# Patient Record
Sex: Female | Born: 1950 | Race: Black or African American | Hispanic: No | Smoking: Never smoker
Health system: Southern US, Community
[De-identification: ages and names within clinical notes are randomized; demographics above are authoritative.]

## PROBLEM LIST (undated history)

## (undated) DIAGNOSIS — N189 Chronic kidney disease, unspecified: Secondary | ICD-10-CM

## (undated) DIAGNOSIS — I10 Essential (primary) hypertension: Secondary | ICD-10-CM

## (undated) DIAGNOSIS — E7800 Pure hypercholesterolemia, unspecified: Secondary | ICD-10-CM

## (undated) DIAGNOSIS — I442 Atrioventricular block, complete: Secondary | ICD-10-CM

## (undated) DIAGNOSIS — J9611 Chronic respiratory failure with hypoxia: Secondary | ICD-10-CM

## (undated) DIAGNOSIS — J449 Chronic obstructive pulmonary disease, unspecified: Secondary | ICD-10-CM

## (undated) DIAGNOSIS — E119 Type 2 diabetes mellitus without complications: Secondary | ICD-10-CM

## (undated) DIAGNOSIS — M1990 Unspecified osteoarthritis, unspecified site: Secondary | ICD-10-CM

## (undated) DIAGNOSIS — J45909 Unspecified asthma, uncomplicated: Secondary | ICD-10-CM

## (undated) DIAGNOSIS — I509 Heart failure, unspecified: Secondary | ICD-10-CM

## (undated) HISTORY — DX: Atrioventricular block, complete: I44.2

## (undated) HISTORY — DX: Chronic kidney disease, unspecified: N18.9

## (undated) HISTORY — DX: Chronic obstructive pulmonary disease, unspecified: J44.9

## (undated) HISTORY — DX: Hypomagnesemia: E83.42

## (undated) HISTORY — DX: Heart failure, unspecified: I50.9

## (undated) HISTORY — DX: Chronic respiratory failure with hypoxia: J96.11

## (undated) HISTORY — PX: ABDOMINAL HYSTERECTOMY: SHX81

---

## 1998-01-02 HISTORY — PX: OTHER SURGICAL HISTORY: SHX169

## 2007-10-06 ENCOUNTER — Emergency Department (HOSPITAL_BASED_OUTPATIENT_CLINIC_OR_DEPARTMENT_OTHER): Admission: EM | Admit: 2007-10-06 | Discharge: 2007-10-06 | Payer: Self-pay | Admitting: Emergency Medicine

## 2008-06-13 ENCOUNTER — Emergency Department (HOSPITAL_BASED_OUTPATIENT_CLINIC_OR_DEPARTMENT_OTHER): Admission: EM | Admit: 2008-06-13 | Discharge: 2008-06-13 | Payer: Self-pay | Admitting: Emergency Medicine

## 2008-09-02 ENCOUNTER — Ambulatory Visit (HOSPITAL_BASED_OUTPATIENT_CLINIC_OR_DEPARTMENT_OTHER): Admission: RE | Admit: 2008-09-02 | Discharge: 2008-09-02 | Payer: Self-pay | Admitting: Internal Medicine

## 2008-09-02 ENCOUNTER — Ambulatory Visit: Payer: Self-pay | Admitting: Diagnostic Radiology

## 2010-10-04 LAB — GLUCOSE, CAPILLARY: Glucose-Capillary: 155 — ABNORMAL HIGH

## 2013-07-05 ENCOUNTER — Encounter (HOSPITAL_BASED_OUTPATIENT_CLINIC_OR_DEPARTMENT_OTHER): Payer: Self-pay | Admitting: Emergency Medicine

## 2013-07-05 ENCOUNTER — Emergency Department (HOSPITAL_BASED_OUTPATIENT_CLINIC_OR_DEPARTMENT_OTHER)
Admission: EM | Admit: 2013-07-05 | Discharge: 2013-07-05 | Disposition: A | Discharge status: Home / Self Care | Payer: Medicare HMO | Attending: Emergency Medicine | Admitting: Emergency Medicine

## 2013-07-05 DIAGNOSIS — I10 Essential (primary) hypertension: Secondary | ICD-10-CM | POA: Insufficient documentation

## 2013-07-05 DIAGNOSIS — Z7982 Long term (current) use of aspirin: Secondary | ICD-10-CM | POA: Insufficient documentation

## 2013-07-05 DIAGNOSIS — M5380 Other specified dorsopathies, site unspecified: Secondary | ICD-10-CM | POA: Insufficient documentation

## 2013-07-05 DIAGNOSIS — M62830 Muscle spasm of back: Secondary | ICD-10-CM

## 2013-07-05 DIAGNOSIS — E119 Type 2 diabetes mellitus without complications: Secondary | ICD-10-CM | POA: Insufficient documentation

## 2013-07-05 DIAGNOSIS — Z79899 Other long term (current) drug therapy: Secondary | ICD-10-CM | POA: Insufficient documentation

## 2013-07-05 DIAGNOSIS — Z794 Long term (current) use of insulin: Secondary | ICD-10-CM | POA: Insufficient documentation

## 2013-07-05 DIAGNOSIS — M129 Arthropathy, unspecified: Secondary | ICD-10-CM | POA: Insufficient documentation

## 2013-07-05 HISTORY — DX: Pure hypercholesterolemia, unspecified: E78.00

## 2013-07-05 HISTORY — DX: Type 2 diabetes mellitus without complications: E11.9

## 2013-07-05 HISTORY — DX: Unspecified osteoarthritis, unspecified site: M19.90

## 2013-07-05 HISTORY — DX: Essential (primary) hypertension: I10

## 2013-07-05 MED ORDER — CYCLOBENZAPRINE HCL 5 MG PO TABS: 5.0000 mg | ORAL_TABLET | Freq: Three times a day (TID) | ORAL | Status: DC | PRN

## 2013-07-05 MED ORDER — IBUPROFEN 600 MG PO TABS: 600.0000 mg | ORAL_TABLET | Freq: Four times a day (QID) | ORAL | Status: DC | PRN

## 2013-07-05 NOTE — Discharge Instructions (Signed)

## 2013-07-05 NOTE — ED Provider Notes (Signed)
CSN: [id number]     Arrival date & time 07/05/13  1237 History   First MD Initiated Contact with Patient 07/05/13 1343     Chief Complaint  Patient presents with  . Back Pain     (Consider location/radiation/quality/duration/timing/severity/associated sxs/prior Treatment) HPI Morgan Fischer is a 63 y.o. female that presents to the ED for left back pain. Her pain started 5 days ago. Pain is non-radiating and worse with movement and when laying on her right side. She has been taking ibuprofen 600mg  twice daily which helps. She has no recent injury. No recent heavy lifting or exercising. No fevers, no abdominal pain, no urinary symptoms.  Past Medical History  Diagnosis Date  . Diabetes mellitus without complication   . Hypertension   . High cholesterol   . Arthritis    History reviewed. No pertinent past surgical history. No family history on file. History  Substance Use Topics  . Smoking status: Never Smoker   . Smokeless tobacco: Not on file  . Alcohol Use: Not on file   OB History   Grav Para Term Preterm Abortions TAB SAB Ect Mult Living                 Review of Systems  Constitutional: Negative for fever.  Gastrointestinal: Negative for nausea and vomiting.  Genitourinary: Negative for dysuria, frequency and flank pain.  Musculoskeletal: Positive for back pain. Negative for gait problem.  All other systems reviewed and are negative.     Allergies  Ciprocinonide; Catapres; and Demadex  Home Medications   Prior to Admission medications   Medication Sig Start Date End Date Taking? Authorizing Provider  allopurinol (ZYLOPRIM) 300 MG tablet Take 300 mg by mouth daily.   Yes Historical Provider, MD  amLODipine (NORVASC) 10 MG tablet Take 10 mg by mouth daily.   Yes Historical Provider, MD  aspirin 325 MG EC tablet Take 325 mg by mouth daily.   Yes Historical Provider, MD  atenolol-chlorthalidone (TENORETIC) 100-25 MG per tablet Take 1 tablet by mouth daily.   Yes  Historical Provider, MD  cholecalciferol (VITAMIN D) 1000 UNITS tablet Take 1,000 Units by mouth daily.   Yes Historical Provider, MD  furosemide (LASIX) 20 MG tablet Take 230 mg by mouth 2 (two) times daily.   Yes Historical Provider, MD  glimepiride (AMARYL) 4 MG tablet Take 4 mg by mouth 2 (two) times daily.   Yes Historical Provider, MD  insulin NPH-regular Human (NOVOLIN 70/30) (70-30) 100 UNIT/ML injection Inject 50 Units into the skin 2 (two) times daily with a meal.   Yes Historical Provider, MD  losartan (COZAAR) 50 MG tablet Take 50 mg by mouth daily.   Yes Historical Provider, MD  montelukast (SINGULAIR) 10 MG tablet Take 10 mg by mouth at bedtime.   Yes Historical Provider, MD  nateglinide (STARLIX) 60 MG tablet Take 60 mg by mouth 3 (three) times daily with meals.   Yes Historical Provider, MD  potassium chloride SA (K-DUR,KLOR-CON) 20 MEQ tablet Take 20 mEq by mouth 2 (two) times daily.   Yes Historical Provider, MD  sitaGLIPtin-metformin (JANUMET) 50-1000 MG per tablet Take 1 tablet by mouth once.   Yes Historical Provider, MD  cyclobenzaprine (FLEXERIL) 5 MG tablet Take 1 tablet (5 mg total) by mouth 3 (three) times daily as needed for muscle spasms. 07/05/13   Jacquelin Hawkingalph Nettey, MD  ibuprofen (ADVIL,MOTRIN) 600 MG tablet Take 1 tablet (600 mg total) by mouth every 6 (six) hours as needed. 07/05/13  Jacquelin Hawkingalph Nettey, MD   BP 174/82  Pulse 77  Temp(Src) 98 F (36.7 C)  Resp 18  Wt 350 lb (158.759 kg)  SpO2 100%  Physical Exam  Vitals reviewed. Constitutional: She is oriented to person, place, and time. She appears well-developed and well-nourished.  Eyes: Conjunctivae and EOM are normal.  Pulmonary/Chest: Effort normal.  Musculoskeletal: Normal range of motion.       Lumbar back: She exhibits tenderness (left sided tenderness). She exhibits normal range of motion and no bony tenderness.  Neurological: She is alert and oriented to person, place, and time.  Skin: Skin is warm and dry.     ED Course  Procedures (including critical care time) Labs Review Labs Reviewed - No data to display  Imaging Review No results found.   EKG Interpretation None      MDM   Final diagnoses:  Muscle spasm of back    Patient's pain consistent with muscle spasm. No decreased range of motion, no vertebral tenderness, no urinary symptoms and pain does not interfere with daily living. Prescribed ibuprofen, muscle relaxant. Discussed treatment plan with patient and her husband. Patient understands and agrees with plan. Will follow-up with PCP as needed.  Jacquelin Hawkingalph Nettey, MD 07/05/13 [phone]

## 2013-07-05 NOTE — ED Notes (Signed)
Patient here with increasing left sided back pain since Monday, pain worse with movement and change in position, denies urinary symptoms

## 2013-07-06 NOTE — ED Provider Notes (Signed)
I saw and evaluated the patient, reviewed the resident's note and I agree with the findings and plan.   .Face to face Exam:  General:  Awake HEENT:  Atraumatic Resp:  Normal effort Abd:  Nondistended Neuro:No focal weakness  Nelia Shiobert L Detra Bores, MD 07/06/13 [phone]

## 2015-01-08 DIAGNOSIS — I2781 Cor pulmonale (chronic): Secondary | ICD-10-CM | POA: Diagnosis not present

## 2015-01-08 DIAGNOSIS — R0602 Shortness of breath: Secondary | ICD-10-CM | POA: Diagnosis not present

## 2015-01-08 DIAGNOSIS — G4730 Sleep apnea, unspecified: Secondary | ICD-10-CM | POA: Diagnosis not present

## 2015-01-08 DIAGNOSIS — J45909 Unspecified asthma, uncomplicated: Secondary | ICD-10-CM | POA: Diagnosis not present

## 2015-01-13 DIAGNOSIS — J449 Chronic obstructive pulmonary disease, unspecified: Secondary | ICD-10-CM | POA: Diagnosis not present

## 2015-01-13 DIAGNOSIS — J45909 Unspecified asthma, uncomplicated: Secondary | ICD-10-CM | POA: Diagnosis not present

## 2015-01-23 DIAGNOSIS — J449 Chronic obstructive pulmonary disease, unspecified: Secondary | ICD-10-CM | POA: Diagnosis not present

## 2015-01-23 DIAGNOSIS — J45909 Unspecified asthma, uncomplicated: Secondary | ICD-10-CM | POA: Diagnosis not present

## 2015-02-13 DIAGNOSIS — J449 Chronic obstructive pulmonary disease, unspecified: Secondary | ICD-10-CM | POA: Diagnosis not present

## 2015-02-13 DIAGNOSIS — J45909 Unspecified asthma, uncomplicated: Secondary | ICD-10-CM | POA: Diagnosis not present

## 2015-02-22 DIAGNOSIS — M79671 Pain in right foot: Secondary | ICD-10-CM | POA: Diagnosis not present

## 2015-02-22 DIAGNOSIS — E1159 Type 2 diabetes mellitus with other circulatory complications: Secondary | ICD-10-CM | POA: Diagnosis not present

## 2015-02-22 DIAGNOSIS — M79672 Pain in left foot: Secondary | ICD-10-CM | POA: Diagnosis not present

## 2015-02-22 DIAGNOSIS — B351 Tinea unguium: Secondary | ICD-10-CM | POA: Diagnosis not present

## 2015-02-22 DIAGNOSIS — L84 Corns and callosities: Secondary | ICD-10-CM | POA: Diagnosis not present

## 2015-02-23 DIAGNOSIS — J449 Chronic obstructive pulmonary disease, unspecified: Secondary | ICD-10-CM | POA: Diagnosis not present

## 2015-02-23 DIAGNOSIS — J45909 Unspecified asthma, uncomplicated: Secondary | ICD-10-CM | POA: Diagnosis not present

## 2015-03-02 DIAGNOSIS — I349 Nonrheumatic mitral valve disorder, unspecified: Secondary | ICD-10-CM | POA: Diagnosis not present

## 2015-03-02 DIAGNOSIS — I10 Essential (primary) hypertension: Secondary | ICD-10-CM | POA: Diagnosis not present

## 2015-03-08 DIAGNOSIS — G4733 Obstructive sleep apnea (adult) (pediatric): Secondary | ICD-10-CM | POA: Diagnosis not present

## 2015-03-08 DIAGNOSIS — Z113 Encounter for screening for infections with a predominantly sexual mode of transmission: Secondary | ICD-10-CM | POA: Diagnosis not present

## 2015-03-08 DIAGNOSIS — Z131 Encounter for screening for diabetes mellitus: Secondary | ICD-10-CM | POA: Diagnosis not present

## 2015-03-08 DIAGNOSIS — I429 Cardiomyopathy, unspecified: Secondary | ICD-10-CM | POA: Diagnosis not present

## 2015-03-08 DIAGNOSIS — I10 Essential (primary) hypertension: Secondary | ICD-10-CM | POA: Diagnosis not present

## 2015-03-08 DIAGNOSIS — Z136 Encounter for screening for cardiovascular disorders: Secondary | ICD-10-CM | POA: Diagnosis not present

## 2015-03-08 DIAGNOSIS — E785 Hyperlipidemia, unspecified: Secondary | ICD-10-CM | POA: Diagnosis not present

## 2015-03-08 DIAGNOSIS — Z0100 Encounter for examination of eyes and vision without abnormal findings: Secondary | ICD-10-CM | POA: Diagnosis not present

## 2015-03-08 DIAGNOSIS — E119 Type 2 diabetes mellitus without complications: Secondary | ICD-10-CM | POA: Diagnosis not present

## 2015-03-08 DIAGNOSIS — Z1389 Encounter for screening for other disorder: Secondary | ICD-10-CM | POA: Diagnosis not present

## 2015-03-08 DIAGNOSIS — I119 Hypertensive heart disease without heart failure: Secondary | ICD-10-CM | POA: Diagnosis not present

## 2015-03-08 DIAGNOSIS — J45909 Unspecified asthma, uncomplicated: Secondary | ICD-10-CM | POA: Diagnosis not present

## 2015-03-08 DIAGNOSIS — Z0000 Encounter for general adult medical examination without abnormal findings: Secondary | ICD-10-CM | POA: Diagnosis not present

## 2015-03-08 DIAGNOSIS — Z01118 Encounter for examination of ears and hearing with other abnormal findings: Secondary | ICD-10-CM | POA: Diagnosis not present

## 2015-03-13 DIAGNOSIS — J45909 Unspecified asthma, uncomplicated: Secondary | ICD-10-CM | POA: Diagnosis not present

## 2015-03-13 DIAGNOSIS — J449 Chronic obstructive pulmonary disease, unspecified: Secondary | ICD-10-CM | POA: Diagnosis not present

## 2015-03-23 DIAGNOSIS — J449 Chronic obstructive pulmonary disease, unspecified: Secondary | ICD-10-CM | POA: Diagnosis not present

## 2015-03-23 DIAGNOSIS — J45909 Unspecified asthma, uncomplicated: Secondary | ICD-10-CM | POA: Diagnosis not present

## 2015-03-31 DIAGNOSIS — L819 Disorder of pigmentation, unspecified: Secondary | ICD-10-CM | POA: Diagnosis not present

## 2015-04-01 DIAGNOSIS — I349 Nonrheumatic mitral valve disorder, unspecified: Secondary | ICD-10-CM | POA: Diagnosis not present

## 2015-04-01 DIAGNOSIS — I10 Essential (primary) hypertension: Secondary | ICD-10-CM | POA: Diagnosis not present

## 2015-04-08 DIAGNOSIS — I349 Nonrheumatic mitral valve disorder, unspecified: Secondary | ICD-10-CM | POA: Diagnosis not present

## 2015-04-08 DIAGNOSIS — I10 Essential (primary) hypertension: Secondary | ICD-10-CM | POA: Diagnosis not present

## 2015-04-13 DIAGNOSIS — J449 Chronic obstructive pulmonary disease, unspecified: Secondary | ICD-10-CM | POA: Diagnosis not present

## 2015-04-13 DIAGNOSIS — J45909 Unspecified asthma, uncomplicated: Secondary | ICD-10-CM | POA: Diagnosis not present

## 2015-04-23 DIAGNOSIS — J449 Chronic obstructive pulmonary disease, unspecified: Secondary | ICD-10-CM | POA: Diagnosis not present

## 2015-04-23 DIAGNOSIS — J45909 Unspecified asthma, uncomplicated: Secondary | ICD-10-CM | POA: Diagnosis not present

## 2015-05-06 DIAGNOSIS — I349 Nonrheumatic mitral valve disorder, unspecified: Secondary | ICD-10-CM | POA: Diagnosis not present

## 2015-05-06 DIAGNOSIS — E1159 Type 2 diabetes mellitus with other circulatory complications: Secondary | ICD-10-CM | POA: Diagnosis not present

## 2015-05-06 DIAGNOSIS — L84 Corns and callosities: Secondary | ICD-10-CM | POA: Diagnosis not present

## 2015-05-06 DIAGNOSIS — M79671 Pain in right foot: Secondary | ICD-10-CM | POA: Diagnosis not present

## 2015-05-06 DIAGNOSIS — I10 Essential (primary) hypertension: Secondary | ICD-10-CM | POA: Diagnosis not present

## 2015-05-06 DIAGNOSIS — B351 Tinea unguium: Secondary | ICD-10-CM | POA: Diagnosis not present

## 2015-05-06 DIAGNOSIS — M79672 Pain in left foot: Secondary | ICD-10-CM | POA: Diagnosis not present

## 2015-05-13 DIAGNOSIS — J45909 Unspecified asthma, uncomplicated: Secondary | ICD-10-CM | POA: Diagnosis not present

## 2015-05-13 DIAGNOSIS — J449 Chronic obstructive pulmonary disease, unspecified: Secondary | ICD-10-CM | POA: Diagnosis not present

## 2015-05-17 DIAGNOSIS — I10 Essential (primary) hypertension: Secondary | ICD-10-CM | POA: Diagnosis not present

## 2015-05-17 DIAGNOSIS — J45909 Unspecified asthma, uncomplicated: Secondary | ICD-10-CM | POA: Diagnosis not present

## 2015-05-17 DIAGNOSIS — Z136 Encounter for screening for cardiovascular disorders: Secondary | ICD-10-CM | POA: Diagnosis not present

## 2015-05-17 DIAGNOSIS — Z01118 Encounter for examination of ears and hearing with other abnormal findings: Secondary | ICD-10-CM | POA: Diagnosis not present

## 2015-05-17 DIAGNOSIS — Z0000 Encounter for general adult medical examination without abnormal findings: Secondary | ICD-10-CM | POA: Diagnosis not present

## 2015-05-17 DIAGNOSIS — E785 Hyperlipidemia, unspecified: Secondary | ICD-10-CM | POA: Diagnosis not present

## 2015-05-17 DIAGNOSIS — I429 Cardiomyopathy, unspecified: Secondary | ICD-10-CM | POA: Diagnosis not present

## 2015-05-17 DIAGNOSIS — G4733 Obstructive sleep apnea (adult) (pediatric): Secondary | ICD-10-CM | POA: Diagnosis not present

## 2015-05-17 DIAGNOSIS — I119 Hypertensive heart disease without heart failure: Secondary | ICD-10-CM | POA: Diagnosis not present

## 2015-05-19 DIAGNOSIS — R0989 Other specified symptoms and signs involving the circulatory and respiratory systems: Secondary | ICD-10-CM | POA: Diagnosis not present

## 2015-05-19 DIAGNOSIS — E785 Hyperlipidemia, unspecified: Secondary | ICD-10-CM | POA: Diagnosis not present

## 2015-05-23 DIAGNOSIS — J45909 Unspecified asthma, uncomplicated: Secondary | ICD-10-CM | POA: Diagnosis not present

## 2015-05-23 DIAGNOSIS — J449 Chronic obstructive pulmonary disease, unspecified: Secondary | ICD-10-CM | POA: Diagnosis not present

## 2015-05-25 DIAGNOSIS — G4733 Obstructive sleep apnea (adult) (pediatric): Secondary | ICD-10-CM | POA: Diagnosis not present

## 2015-05-25 DIAGNOSIS — E785 Hyperlipidemia, unspecified: Secondary | ICD-10-CM | POA: Diagnosis not present

## 2015-05-25 DIAGNOSIS — I10 Essential (primary) hypertension: Secondary | ICD-10-CM | POA: Diagnosis not present

## 2015-05-25 DIAGNOSIS — J45909 Unspecified asthma, uncomplicated: Secondary | ICD-10-CM | POA: Diagnosis not present

## 2015-05-25 DIAGNOSIS — I119 Hypertensive heart disease without heart failure: Secondary | ICD-10-CM | POA: Diagnosis not present

## 2015-05-25 DIAGNOSIS — I429 Cardiomyopathy, unspecified: Secondary | ICD-10-CM | POA: Diagnosis not present

## 2015-05-25 DIAGNOSIS — E119 Type 2 diabetes mellitus without complications: Secondary | ICD-10-CM | POA: Diagnosis not present

## 2015-06-03 DIAGNOSIS — I349 Nonrheumatic mitral valve disorder, unspecified: Secondary | ICD-10-CM | POA: Diagnosis not present

## 2015-06-03 DIAGNOSIS — I10 Essential (primary) hypertension: Secondary | ICD-10-CM | POA: Diagnosis not present

## 2015-06-13 DIAGNOSIS — J449 Chronic obstructive pulmonary disease, unspecified: Secondary | ICD-10-CM | POA: Diagnosis not present

## 2015-06-13 DIAGNOSIS — J45909 Unspecified asthma, uncomplicated: Secondary | ICD-10-CM | POA: Diagnosis not present

## 2015-06-23 DIAGNOSIS — J449 Chronic obstructive pulmonary disease, unspecified: Secondary | ICD-10-CM | POA: Diagnosis not present

## 2015-06-23 DIAGNOSIS — J45909 Unspecified asthma, uncomplicated: Secondary | ICD-10-CM | POA: Diagnosis not present

## 2015-07-07 DIAGNOSIS — I10 Essential (primary) hypertension: Secondary | ICD-10-CM | POA: Diagnosis not present

## 2015-07-07 DIAGNOSIS — I349 Nonrheumatic mitral valve disorder, unspecified: Secondary | ICD-10-CM | POA: Diagnosis not present

## 2015-07-08 DIAGNOSIS — M79671 Pain in right foot: Secondary | ICD-10-CM | POA: Diagnosis not present

## 2015-07-08 DIAGNOSIS — E1159 Type 2 diabetes mellitus with other circulatory complications: Secondary | ICD-10-CM | POA: Diagnosis not present

## 2015-07-08 DIAGNOSIS — L84 Corns and callosities: Secondary | ICD-10-CM | POA: Diagnosis not present

## 2015-07-08 DIAGNOSIS — M79672 Pain in left foot: Secondary | ICD-10-CM | POA: Diagnosis not present

## 2015-07-08 DIAGNOSIS — B351 Tinea unguium: Secondary | ICD-10-CM | POA: Diagnosis not present

## 2015-07-13 DIAGNOSIS — J449 Chronic obstructive pulmonary disease, unspecified: Secondary | ICD-10-CM | POA: Diagnosis not present

## 2015-07-13 DIAGNOSIS — J45909 Unspecified asthma, uncomplicated: Secondary | ICD-10-CM | POA: Diagnosis not present

## 2015-07-15 DIAGNOSIS — I2781 Cor pulmonale (chronic): Secondary | ICD-10-CM | POA: Diagnosis not present

## 2015-07-15 DIAGNOSIS — G4710 Hypersomnia, unspecified: Secondary | ICD-10-CM | POA: Diagnosis not present

## 2015-07-15 DIAGNOSIS — R0602 Shortness of breath: Secondary | ICD-10-CM | POA: Diagnosis not present

## 2015-07-15 DIAGNOSIS — J45909 Unspecified asthma, uncomplicated: Secondary | ICD-10-CM | POA: Diagnosis not present

## 2015-07-23 DIAGNOSIS — J45909 Unspecified asthma, uncomplicated: Secondary | ICD-10-CM | POA: Diagnosis not present

## 2015-07-23 DIAGNOSIS — J449 Chronic obstructive pulmonary disease, unspecified: Secondary | ICD-10-CM | POA: Diagnosis not present

## 2015-08-02 DIAGNOSIS — J45909 Unspecified asthma, uncomplicated: Secondary | ICD-10-CM | POA: Diagnosis not present

## 2015-08-02 DIAGNOSIS — I119 Hypertensive heart disease without heart failure: Secondary | ICD-10-CM | POA: Diagnosis not present

## 2015-08-02 DIAGNOSIS — G4733 Obstructive sleep apnea (adult) (pediatric): Secondary | ICD-10-CM | POA: Diagnosis not present

## 2015-08-02 DIAGNOSIS — I429 Cardiomyopathy, unspecified: Secondary | ICD-10-CM | POA: Diagnosis not present

## 2015-08-02 DIAGNOSIS — I10 Essential (primary) hypertension: Secondary | ICD-10-CM | POA: Diagnosis not present

## 2015-08-02 DIAGNOSIS — E119 Type 2 diabetes mellitus without complications: Secondary | ICD-10-CM | POA: Diagnosis not present

## 2015-08-02 DIAGNOSIS — E785 Hyperlipidemia, unspecified: Secondary | ICD-10-CM | POA: Diagnosis not present

## 2015-08-04 DIAGNOSIS — G4720 Circadian rhythm sleep disorder, unspecified type: Secondary | ICD-10-CM | POA: Diagnosis not present

## 2015-08-04 DIAGNOSIS — R0902 Hypoxemia: Secondary | ICD-10-CM | POA: Diagnosis not present

## 2015-08-04 DIAGNOSIS — G4730 Sleep apnea, unspecified: Secondary | ICD-10-CM | POA: Diagnosis not present

## 2015-08-04 DIAGNOSIS — I10 Essential (primary) hypertension: Secondary | ICD-10-CM | POA: Diagnosis not present

## 2015-08-04 DIAGNOSIS — I349 Nonrheumatic mitral valve disorder, unspecified: Secondary | ICD-10-CM | POA: Diagnosis not present

## 2015-08-13 DIAGNOSIS — J45909 Unspecified asthma, uncomplicated: Secondary | ICD-10-CM | POA: Diagnosis not present

## 2015-08-13 DIAGNOSIS — J449 Chronic obstructive pulmonary disease, unspecified: Secondary | ICD-10-CM | POA: Diagnosis not present

## 2015-08-18 DIAGNOSIS — M810 Age-related osteoporosis without current pathological fracture: Secondary | ICD-10-CM | POA: Diagnosis not present

## 2015-08-18 DIAGNOSIS — I2781 Cor pulmonale (chronic): Secondary | ICD-10-CM | POA: Diagnosis not present

## 2015-08-18 DIAGNOSIS — Z13820 Encounter for screening for osteoporosis: Secondary | ICD-10-CM | POA: Diagnosis not present

## 2015-08-18 DIAGNOSIS — E669 Obesity, unspecified: Secondary | ICD-10-CM | POA: Diagnosis not present

## 2015-08-18 DIAGNOSIS — R0902 Hypoxemia: Secondary | ICD-10-CM | POA: Diagnosis not present

## 2015-08-18 DIAGNOSIS — Z7951 Long term (current) use of inhaled steroids: Secondary | ICD-10-CM | POA: Diagnosis not present

## 2015-08-18 DIAGNOSIS — J45909 Unspecified asthma, uncomplicated: Secondary | ICD-10-CM | POA: Diagnosis not present

## 2015-08-23 ENCOUNTER — Emergency Department (HOSPITAL_BASED_OUTPATIENT_CLINIC_OR_DEPARTMENT_OTHER): Payer: Commercial Managed Care - HMO

## 2015-08-23 ENCOUNTER — Inpatient Hospital Stay (HOSPITAL_BASED_OUTPATIENT_CLINIC_OR_DEPARTMENT_OTHER)
Admission: EM | Admit: 2015-08-23 | Discharge: 2015-08-28 | DRG: 606 | Disposition: A | Discharge status: Home Health | Payer: Commercial Managed Care - HMO | Attending: Internal Medicine | Admitting: Internal Medicine

## 2015-08-23 ENCOUNTER — Encounter (HOSPITAL_BASED_OUTPATIENT_CLINIC_OR_DEPARTMENT_OTHER): Payer: Self-pay | Admitting: *Deleted

## 2015-08-23 DIAGNOSIS — R7989 Other specified abnormal findings of blood chemistry: Secondary | ICD-10-CM | POA: Diagnosis present

## 2015-08-23 DIAGNOSIS — I5033 Acute on chronic diastolic (congestive) heart failure: Secondary | ICD-10-CM | POA: Diagnosis present

## 2015-08-23 DIAGNOSIS — Z794 Long term (current) use of insulin: Secondary | ICD-10-CM

## 2015-08-23 DIAGNOSIS — Z888 Allergy status to other drugs, medicaments and biological substances status: Secondary | ICD-10-CM

## 2015-08-23 DIAGNOSIS — R935 Abnormal findings on diagnostic imaging of other abdominal regions, including retroperitoneum: Secondary | ICD-10-CM | POA: Diagnosis not present

## 2015-08-23 DIAGNOSIS — R0602 Shortness of breath: Secondary | ICD-10-CM | POA: Diagnosis not present

## 2015-08-23 DIAGNOSIS — E785 Hyperlipidemia, unspecified: Secondary | ICD-10-CM | POA: Diagnosis present

## 2015-08-23 DIAGNOSIS — R748 Abnormal levels of other serum enzymes: Secondary | ICD-10-CM | POA: Diagnosis present

## 2015-08-23 DIAGNOSIS — Z8249 Family history of ischemic heart disease and other diseases of the circulatory system: Secondary | ICD-10-CM | POA: Diagnosis not present

## 2015-08-23 DIAGNOSIS — M793 Panniculitis, unspecified: Principal | ICD-10-CM | POA: Diagnosis present

## 2015-08-23 DIAGNOSIS — I110 Hypertensive heart disease with heart failure: Secondary | ICD-10-CM | POA: Diagnosis present

## 2015-08-23 DIAGNOSIS — Z6841 Body Mass Index (BMI) 40.0 and over, adult: Secondary | ICD-10-CM

## 2015-08-23 DIAGNOSIS — I10 Essential (primary) hypertension: Secondary | ICD-10-CM | POA: Diagnosis present

## 2015-08-23 DIAGNOSIS — Z7982 Long term (current) use of aspirin: Secondary | ICD-10-CM | POA: Diagnosis not present

## 2015-08-23 DIAGNOSIS — Z79899 Other long term (current) drug therapy: Secondary | ICD-10-CM

## 2015-08-23 DIAGNOSIS — J45909 Unspecified asthma, uncomplicated: Secondary | ICD-10-CM | POA: Diagnosis present

## 2015-08-23 DIAGNOSIS — I159 Secondary hypertension, unspecified: Secondary | ICD-10-CM | POA: Diagnosis present

## 2015-08-23 DIAGNOSIS — J449 Chronic obstructive pulmonary disease, unspecified: Secondary | ICD-10-CM | POA: Diagnosis not present

## 2015-08-23 DIAGNOSIS — E7800 Pure hypercholesterolemia, unspecified: Secondary | ICD-10-CM | POA: Diagnosis present

## 2015-08-23 DIAGNOSIS — I509 Heart failure, unspecified: Secondary | ICD-10-CM

## 2015-08-23 DIAGNOSIS — M109 Gout, unspecified: Secondary | ICD-10-CM | POA: Diagnosis present

## 2015-08-23 DIAGNOSIS — R778 Other specified abnormalities of plasma proteins: Secondary | ICD-10-CM | POA: Diagnosis present

## 2015-08-23 DIAGNOSIS — Z9981 Dependence on supplemental oxygen: Secondary | ICD-10-CM

## 2015-08-23 DIAGNOSIS — Z833 Family history of diabetes mellitus: Secondary | ICD-10-CM

## 2015-08-23 DIAGNOSIS — E876 Hypokalemia: Secondary | ICD-10-CM | POA: Diagnosis present

## 2015-08-23 DIAGNOSIS — E119 Type 2 diabetes mellitus without complications: Secondary | ICD-10-CM | POA: Diagnosis present

## 2015-08-23 DIAGNOSIS — N183 Chronic kidney disease, stage 3 unspecified: Secondary | ICD-10-CM

## 2015-08-23 HISTORY — DX: Morbid (severe) obesity due to excess calories: E66.01

## 2015-08-23 HISTORY — DX: Unspecified asthma, uncomplicated: J45.909

## 2015-08-23 HISTORY — DX: Panniculitis, unspecified: M79.3

## 2015-08-23 LAB — TROPONIN I: Troponin I: 0.04 ng/mL (ref <0.03)

## 2015-08-23 LAB — MAGNESIUM: MAGNESIUM: 1.4 mg/dL — AB (ref 1.7–2.4)

## 2015-08-23 LAB — CBC WITH DIFFERENTIAL/PLATELET
Basophils Absolute: 0 10*3/uL (ref 0.0–0.1)
Basophils Relative: 0 %
EOS PCT: 0 %
Eosinophils Absolute: 0 10*3/uL (ref 0.0–0.7)
HEMATOCRIT: 35.4 % — AB (ref 36.0–46.0)
Hemoglobin: 11.5 g/dL — ABNORMAL LOW (ref 12.0–15.0)
LYMPHS ABS: 1.1 10*3/uL (ref 0.7–4.0)
LYMPHS PCT: 9 %
MCH: 28 pg (ref 26.0–34.0)
MCHC: 32.5 g/dL (ref 30.0–36.0)
MCV: 86.3 fL (ref 78.0–100.0)
MONO ABS: 0.7 10*3/uL (ref 0.1–1.0)
Monocytes Relative: 6 %
Neutro Abs: 10.5 10*3/uL — ABNORMAL HIGH (ref 1.7–7.7)
Neutrophils Relative %: 85 %
PLATELETS: 120 10*3/uL — AB (ref 150–400)
RBC: 4.1 MIL/uL (ref 3.87–5.11)
RDW: 15.8 % — AB (ref 11.5–15.5)
WBC: 12.3 10*3/uL — ABNORMAL HIGH (ref 4.0–10.5)

## 2015-08-23 LAB — BASIC METABOLIC PANEL
Anion gap: 9 (ref 5–15)
BUN: 20 mg/dL (ref 6–20)
CHLORIDE: 95 mmol/L — AB (ref 101–111)
CO2: 32 mmol/L (ref 22–32)
Calcium: 8.7 mg/dL — ABNORMAL LOW (ref 8.9–10.3)
Creatinine, Ser: 0.69 mg/dL (ref 0.44–1.00)
GFR calc non Af Amer: >60 mL/min (ref >60)
GLUCOSE: 122 mg/dL — AB (ref 65–99)
POTASSIUM: 2.7 mmol/L — AB (ref 3.5–5.1)
SODIUM: 136 mmol/L (ref 135–145)

## 2015-08-23 LAB — I-STAT CG4 LACTIC ACID, ED: Lactic Acid, Venous: 1.18 mmol/L (ref 0.5–1.9)

## 2015-08-23 LAB — BRAIN NATRIURETIC PEPTIDE: B Natriuretic Peptide: 395.9 pg/mL — ABNORMAL HIGH (ref 0.0–100.0)

## 2015-08-23 MED ORDER — POTASSIUM CHLORIDE 10 MEQ/100ML IV SOLN
10.0000 meq | INTRAVENOUS | Status: AC
  Administered 2015-08-23: 10 meq via INTRAVENOUS
  Filled 2015-08-23 (×2): qty 100

## 2015-08-23 MED ORDER — VANCOMYCIN HCL IN DEXTROSE 1-5 GM/200ML-% IV SOLN
1000.0000 mg | INTRAVENOUS | Status: AC
  Administered 2015-08-23: 1000 mg via INTRAVENOUS
  Filled 2015-08-23: qty 200

## 2015-08-23 MED ORDER — IOPAMIDOL (ISOVUE-300) INJECTION 61%
100.0000 mL | Freq: Once | INTRAVENOUS | Status: AC | PRN
  Administered 2015-08-23: 100 mL via INTRAVENOUS

## 2015-08-23 MED ORDER — MAGNESIUM SULFATE 50 % IJ SOLN
INTRAMUSCULAR | Status: AC
  Administered 2015-08-24: 1 g
  Filled 2015-08-23: qty 2

## 2015-08-23 MED ORDER — FUROSEMIDE 10 MG/ML IJ SOLN
40.0000 mg | INTRAMUSCULAR | Status: AC
  Administered 2015-08-23: 40 mg via INTRAVENOUS
  Filled 2015-08-23: qty 4

## 2015-08-23 MED ORDER — VANCOMYCIN HCL 10 G IV SOLR
1250.0000 mg | Freq: Two times a day (BID) | INTRAVENOUS | Status: DC
  Filled 2015-08-23: qty 1250

## 2015-08-23 MED ORDER — PIPERACILLIN-TAZOBACTAM 3.375 G IVPB
3.3750 g | Freq: Once | INTRAVENOUS | Status: AC
  Administered 2015-08-24: 3.375 g via INTRAVENOUS
  Filled 2015-08-23: qty 50

## 2015-08-23 MED ORDER — MAGNESIUM SULFATE IN D5W 1-5 GM/100ML-% IV SOLN
1.0000 g | Freq: Once | INTRAVENOUS | Status: DC
  Filled 2015-08-23: qty 100

## 2015-08-23 MED ORDER — PIPERACILLIN-TAZOBACTAM 3.375 G IVPB
3.3750 g | Freq: Three times a day (TID) | INTRAVENOUS | Status: AC
  Administered 2015-08-24 – 2015-08-27 (×11): 3.375 g via INTRAVENOUS
  Filled 2015-08-23 (×12): qty 50

## 2015-08-23 NOTE — ED Provider Notes (Signed)
[facility] Note   CSN: [id number] Arrival date & time: 08/23/15  2021  By signing my name below, I, Vista Mink, attest that this documentation has been prepared under the direction and in the presence of Will Keylin Ferryman PA-C.  Electronically Signed: Vista Mink, ED Scribe. 08/23/15. 9:27 PM.   History   Chief Complaint Chief Complaint  Patient presents with  . Cellulitis    HPI HPI Comments: Ramona Ruark is a 65 y.o. female who presents to the Emergency Department complaining of an painful area of increased redness and warmth noted to her lower abdomen that started today. Pt also reports associated headache. Pt further states that she had a fever of 99 earlier today and 101.7 this evening. Pt states she took a Tylenol PTA which brought her temperature to 98 on arrival. Pt's relative further states that the pt seems more short of breath than normal. Patient reports that she feels more short of breath with exertion today. No chest pain.  Pt wears 2L O2 via [place] at home. She reports taking this for asthma. She does take lasix, but cannot tell me what for. She denies history of CHF. Pt had a debridement of a pannus infection back in 2000. She denies cough, chest pain, palpitations, abdominal pain, urinary symptoms, neck pain, nausea, vomiting, diarrhea.    The history is provided by the patient. No language interpreter was used.    Past Medical History:  Diagnosis Date  . Arthritis   . Diabetes mellitus without complication (HCC)   . High cholesterol   . Hypertension     Patient Active Problem List   Diagnosis Date Noted  . Panniculitis 08/23/2015    Past Surgical History:  Procedure Laterality Date  . ABDOMINAL HYSTERECTOMY    . CESAREAN SECTION      OB History    No data available       Home Medications    Prior to Admission medications   Medication Sig Start Date End Date Taking? Authorizing Provider  allopurinol (ZYLOPRIM) 300 MG tablet Take  300 mg by mouth daily.    Historical Provider, MD  amLODipine (NORVASC) 10 MG tablet Take 10 mg by mouth daily.    Historical Provider, MD  aspirin 325 MG EC tablet Take 325 mg by mouth daily.    Historical Provider, MD  atenolol-chlorthalidone (TENORETIC) 100-25 MG per tablet Take 1 tablet by mouth daily.    Historical Provider, MD  cholecalciferol (VITAMIN D) 1000 UNITS tablet Take 1,000 Units by mouth daily.    Historical Provider, MD  cyclobenzaprine (FLEXERIL) 5 MG tablet Take 1 tablet (5 mg total) by mouth 3 (three) times daily as needed for muscle spasms. 07/05/13   Narda Bonds, MD  furosemide (LASIX) 20 MG tablet Take 230 mg by mouth 2 (two) times daily.    Historical Provider, MD  glimepiride (AMARYL) 4 MG tablet Take 4 mg by mouth 2 (two) times daily.    Historical Provider, MD  ibuprofen (ADVIL,MOTRIN) 600 MG tablet Take 1 tablet (600 mg total) by mouth every 6 (six) hours as needed. 07/05/13   Narda Bonds, MD  insulin NPH-regular Human (NOVOLIN 70/30) (70-30) 100 UNIT/ML injection Inject 50 Units into the skin 2 (two) times daily with a meal.    Historical Provider, MD  losartan (COZAAR) 50 MG tablet Take 50 mg by mouth daily.    Historical Provider, MD  montelukast (SINGULAIR) 10 MG tablet Take 10 mg by mouth at bedtime.  Historical Provider, MD  nateglinide (STARLIX) 60 MG tablet Take 60 mg by mouth 3 (three) times daily with meals.    Historical Provider, MD  potassium chloride SA (K-DUR,KLOR-CON) 20 MEQ tablet Take 20 mEq by mouth 2 (two) times daily.    Historical Provider, MD  sitaGLIPtin-metformin (JANUMET) 50-1000 MG per tablet Take 1 tablet by mouth once.    Historical Provider, MD    Family History History reviewed. No pertinent family history.  Social History Social History  Substance Use Topics  . Smoking status: Never Smoker  . Smokeless tobacco: Not on file  . Alcohol use No     Allergies   Ciprocinonide [fluocinolone]; Catapres [clonidine hcl]; and Demadex  [torsemide]   Review of Systems Review of Systems  Constitutional: Positive for fever. Negative for chills.  HENT: Negative for congestion and sore throat.   Eyes: Negative for visual disturbance.  Respiratory: Positive for shortness of breath. Negative for cough and wheezing.   Cardiovascular: Negative for chest pain and palpitations.  Gastrointestinal: Negative for abdominal pain, diarrhea, nausea and vomiting.       Panus rash.   Genitourinary: Negative for difficulty urinating, dysuria, frequency, hematuria, urgency, vaginal bleeding and vaginal discharge.  Musculoskeletal: Negative for back pain and neck pain.  Skin: Positive for color change (erythema) and rash.  Neurological: Negative for headaches.     Physical Exam Updated Vital Signs BP 124/72   Pulse 79   Temp 98.2 F (36.8 C) (Oral)   Resp 25   Ht 5' 3.5" (1.613 m)   Wt (!) 147.4 kg   SpO2 99%   BMI 56.67 kg/m   Physical Exam  Constitutional: She appears well-developed and well-nourished. No distress.  Nontoxic appearing. Obese female.  HENT:  Head: Normocephalic and atraumatic.  Mouth/Throat: Oropharynx is clear and moist.  Eyes: Conjunctivae are normal. Pupils are equal, round, and reactive to light. Right eye exhibits no discharge. Left eye exhibits no discharge.  Neck: Neck supple.  Cardiovascular: Normal rate, regular rhythm, normal heart sounds and intact distal pulses.  Exam reveals no gallop and no friction rub.   No murmur heard. Pulmonary/Chest: Effort normal and breath sounds normal. No respiratory distress. She has no wheezes. She has no rales.  Distant breath sounds due to body habitus   Abdominal: Soft. Bowel sounds are normal. There is no tenderness. There is no guarding.  Patient with very large left-sided pannus flap with erythema and warmth beneath the flap. It is soft and not tender. No abdominal TTP.   Musculoskeletal: She exhibits no edema.  Lymphadenopathy:    She has no cervical  adenopathy.  Neurological: She is alert. Coordination normal.  Skin: Skin is warm and dry. Capillary refill takes less than 2 seconds. Rash noted. She is not diaphoretic. There is erythema. No pallor.  Psychiatric: She has a normal mood and affect. Her behavior is normal.  Nursing note and vitals reviewed.    ED Treatments / Results  DIAGNOSTIC STUDIES: Oxygen Saturation is 98% on [place], normal by my interpretation.  COORDINATION OF CARE: 9:02 PM-Discussed treatment plan with pt at bedside and pt agreed to plan.   Labs (all labs ordered are listed, but only abnormal results are displayed) Labs Reviewed  BASIC METABOLIC PANEL - Abnormal; Notable for the following:       Result Value   Potassium 2.7 (*)    Chloride 95 (*)    Glucose, Bld 122 (*)    Calcium 8.7 (*)    All  other components within normal limits  CBC WITH DIFFERENTIAL/PLATELET - Abnormal; Notable for the following:    WBC 12.3 (*)    Hemoglobin 11.5 (*)    HCT 35.4 (*)    RDW 15.8 (*)    Platelets 120 (*)    Neutro Abs 10.5 (*)    All other components within normal limits  TROPONIN I - Abnormal; Notable for the following:    Troponin I 0.04 (*)    All other components within normal limits  BRAIN NATRIURETIC PEPTIDE - Abnormal; Notable for the following:    B Natriuretic Peptide 395.9 (*)    All other components within normal limits  MAGNESIUM - Abnormal; Notable for the following:    Magnesium 1.4 (*)    All other components within normal limits  I-STAT CG4 LACTIC ACID, ED    EKG  EKG Interpretation  Date/Time:  Monday August 23 2015 21:40:52 EDT Ventricular Rate:  82 PR Interval:    QRS Duration: 105 QT Interval:  395 QTC Calculation: 462 R Axis:   13 Text Interpretation:  Sinus rhythm LVH with secondary repolarization abnormality No previous tracing Confirmed by Blinda LeatherwoodPOLLINA  MD, CHRISTOPHER [phone]([id number]) on 08/24/2015 12:48:51 AM       Radiology Dg Chest 2 View  Result Date: 08/23/2015 CLINICAL DATA:   Chronic shortness of breath, hypoxia EXAM: CHEST  2 VIEW COMPARISON:  10/06/2007 FINDINGS: Cardiomegaly with pulmonary vascular congestion. No frank interstitial edema. Mild right basilar scarring/ atelectasis. No pleural effusion or pneumothorax. Mild degenerative changes of the visualized thoracolumbar spine. IMPRESSION: Cardiomegaly with pulmonary vascular congestion. No frank interstitial edema. Mild right basilar scarring/ atelectasis, chronic. Electronically Signed   By: Charline BillsSriyesh  Krishnan M.D.   On: 08/23/2015 21:39   Ct Abdomen Pelvis W Contrast  Result Date: 08/23/2015 CLINICAL DATA:  Lower abdominal redness, warmth and tenderness for days. Panniculitis or deep infection. EXAM: CT ABDOMEN AND PELVIS WITH CONTRAST TECHNIQUE: Multidetector CT imaging of the abdomen and pelvis was performed using the standard protocol following bolus administration of intravenous contrast. CONTRAST:  100mL ISOVUE-300 IOPAMIDOL (ISOVUE-300) INJECTION 61% COMPARISON:  None. FINDINGS: Lower chest: The included lung bases are clear. Multi chamber cardiomegaly. There are coronary artery calcifications. Mild motion artifact. Liver: Enlarged measuring 24 cm cranial caudal. There is an 11 mm hypodensity in left lobe, incompletely characterized, but likely cyst. Hypodensity adjacent with falciform ligament is likely focal fatty infiltration. Hepatobiliary: Gallbladder physiologically distended. Probable intraluminal gallstone. No pericholecystic inflammation. No biliary dilatation. Pancreas: No ductal dilatation or inflammation. Spleen: Normal. Adrenal glands: No nodule. Kidneys: Symmetric renal enhancement. No hydronephrosis. Symmetric excretion on delayed phase imaging. There is mild nonspecific perinephric edema about the lower poles bilaterally. Stomach/Bowel: Stomach is decompressed. There are no dilated or thickened small bowel loops. Small volume of stool throughout the colon without colonic wall thickening. Minimal  diverticulosis of the descending colon without acute diverticulitis. The appendix is tentatively but not definitively identified. No pericecal inflammation. Vascular/Lymphatic: Prominent right external iliac node measures 14 mm short axis. Similar prominent left external iliac node. Increased number of multiple bilateral inguinal lymph nodes. No retroperitoneal adenopathy. Abdominal aorta is normal in caliber. Mild atherosclerosis of the abdominal aorta and its branches, no aneurysm. Reproductive: Post hysterectomy.  No adnexal mass. Bladder: Minimally distended without wall thickening. Other: Large lower abdominal pannus demonstrates marked skin thickening and subcutaneous reticulation. There is no focal fluid collection. No soft tissue air. No evidence of necrotizing soft tissue infection. There is air small fat containing umbilical hernia.  Prominent vessels in the subcutaneous pannus. No peroneal inflammation. New free intra-abdominal air or free fluid. Musculoskeletal: There are no acute or suspicious osseous abnormalities. Multilevel degenerative change throughout the spine. There is laxity and atrophy of the anterior abdominal wall musculature. IMPRESSION: 1. Superficial panniculitis involving the inferior abdominal pannus with skin thickening and soft tissue edema. No focal fluid collection or evidence of evidence of deep soft tissue infection. Prominent inguinal and external iliac nodes are likely reactive. 2. No acute intra-abdominal/pelvic abnormality. 3. Incidental finding of hepatomegaly. Probable cyst on the left lobe of the liver. Minimal colonic diverticulosis in the descending colon without diverticulitis. Electronically Signed   By: Rubye Oaks M.D.   On: 08/23/2015 22:40    Procedures Procedures (including critical care time)  Medications Ordered in ED Medications  potassium chloride 10 mEq in 100 mL IVPB (0 mEq Intravenous Stopped 08/23/15 2316)  magnesium sulfate IVPB 1 g 100 mL (1 g  Intravenous Not Given 08/24/15 0059)  vancomycin (VANCOCIN) IVPB 1000 mg/200 mL premix (1,000 mg Intravenous New Bag/Given 08/23/15 2305)  vancomycin (VANCOCIN) 1,250 mg in sodium chloride 0.9 % 250 mL IVPB (not administered)  piperacillin-tazobactam (ZOSYN) IVPB 3.375 g (not administered)  iopamidol (ISOVUE-300) 61 % injection 100 mL (100 mLs Intravenous Contrast Given 08/23/15 2217)  magnesium sulfate 50 % (IV Push/IM) injection (1 g  Given 08/24/15 0033)  piperacillin-tazobactam (ZOSYN) IVPB 3.375 g (0 g Intravenous Stopped 08/24/15 0100)  furosemide (LASIX) injection 40 mg (40 mg Intravenous Given 08/23/15 2311)     Initial Impression / Assessment and Plan / ED Course  I have reviewed the triage vital signs and the nursing notes.  Pertinent labs & imaging results that were available during my care of the patient were reviewed by me and considered in my medical decision making (see chart for details).  Clinical Course   Patient presented the emergency department complaining of fever, redness and pain to her pannus. She also reports she has had some increasing shortness of breath with exertion. No chest pain. On exam the patient is afebrile nontoxic  appearing. She has erythema and warmth beneath her left pannus. Her abdomen is soft and nontender. Lactic acid is normal at 1.18. BMP reveals hypokalemia with a potassium of 2.7. Magnesium is low at 1.4. CBC reveals leukocytosis with a white count of 12,000. BNP is elevated at 395. No previous baseline tests. Troponin is mildly elevated at 0.04. Patient denies any chest pain. No STEMI on EKG. I suspect some demand ischemia with some new CHF. Chest x-ray shows some vascular congestion. Patient denies any history of CHF. She does take Lasix but cannot tell me why. She says she takes oxygen at home due to asthma.  Patient was started on vancomycin and Zosyn for likely panniculitis. CT abdomen and pelvis with contrast was obtained to rule out any deeper  infection. CT abdomen and pelvis revealed a superficial panniculitis involving the inferior abdominal pannus. No deep soft tissue infection. Will admit the patient for continued IV antibiotics as well as workup for CHF. She also received 40 of IV Lasix in the emergency department. She does take Lasix 60 mg by mouth daily. Patient agrees with plan for admission.  I consulted with hospitalist Dr. Clyde Lundborg who except the patient for observation. Temporary orders for observation replaced to a telemetry bed.  This patient was discussed with and evaluated by Dr. Patria Mane who agrees with assessment and plan.   Final Clinical Impressions(s) / ED Diagnoses   Final diagnoses:  Panniculitis  Congestive heart failure, unspecified congestive heart failure chronicity, unspecified congestive heart failure type (HCC)  Hypokalemia  Hypomagnesemia    New Prescriptions New Prescriptions   No medications on file   I personally performed the services described in this documentation, which was scribed in my presence. The recorded information has been reviewed and is accurate.       Everlene FarrierWilliam Lavin Petteway, PA-C 08/24/15 0110    Azalia BilisKevin Campos, MD 08/24/15 [phone]

## 2015-08-23 NOTE — ED Notes (Signed)
MD and PA at bedside.  

## 2015-08-23 NOTE — Progress Notes (Signed)
This is a no charge note  [facility], Chrissie NoaWilliam  65 year old lady with past medical history of hypertension, hyperlipidemia, diabetes mellitus, asthma, on home oxygen, gout, who presents with lower abdominal wall pain and redness and shortness of breath. No chest pain.   WBC 12.3, lactate 1.18, troponin 0.04, BNP 395.9, potassium 2.7, creatinine normal, CXR showed pulmonary vascular congestion, CT abdomen/pelvis showed panniculitis. EKG showed T-wave inversion in lateral leads and V4-V5. Pt was given 40 mg of IV lasix in ED. IV vanco and zosyn were started. 10 mEQ of KCl x 2 by IV were ordered . Pt is accepted to tele bed for obs.  Morgan HarpXilin Tahjir Silveria, MD  [facility] Pager [phone]  If 7PM-7AM, please contact night-coverage [email or web address] Password [email or web address] 08/23/2015, 11:57 PM

## 2015-08-23 NOTE — ED Notes (Signed)
PA at bedside.

## 2015-08-23 NOTE — ED Triage Notes (Signed)
Pt c/o redness and warmth to lower abd with chills and fever x 1 day

## 2015-08-23 NOTE — ED Notes (Signed)
Unable to scan the second Potassium Chloride, in .  Second bag hung at this time.

## 2015-08-23 NOTE — ED Notes (Signed)
Chrissie NoaWilliam PA notified of critical K+ 2.7

## 2015-08-23 NOTE — ED Notes (Signed)
Primary RN Windell Mouldinguth and PA Dansie notified of troponin 0.04

## 2015-08-23 NOTE — Progress Notes (Signed)
Pharmacy Antibiotic Note  Morgan Fischer is a 65 y.o. female admitted on 08/23/2015 with cellulitis.  Pharmacy has been consulted for vancomycin and zosyn dosing. Pt is afebrile and WBC is mildly elevated. Scr is WNL and lactic acid is WNL.   Plan: Vancomycin 2gm IV x 1 (given at 1gm doses back to back) then 1250mg  IV Q12H Zosyn 3.375gm IV Q8H (4 hr inf) F/u renal fxn, C&S, clinical status and trough at SS  Height: 5' 3.5" (161.3 cm) Weight: (!) 325 lb (147.4 kg) IBW/kg (Calculated) : 53.55  Temp (24hrs), Avg:98.2 F (36.8 C), Min:98.2 F (36.8 C), Max:98.2 F (36.8 C)   Recent Labs Lab 08/23/15 2120 08/23/15 2204  WBC 12.3*  --   CREATININE 0.69  --   LATICACIDVEN  --  1.18    Estimated Creatinine Clearance: 99.5 mL/min (by C-G formula based on SCr of 0.8 mg/dL).    Allergies  Allergen Reactions  . Ciprocinonide [Fluocinolone] Other (See Comments)    Kidney failure  . Catapres [Clonidine Hcl] Rash  . Demadex [Torsemide] Rash    Antimicrobials this admission: Vanc 8/21>> Zosyn 8/21>>  Dose adjustments this admission: N/A  Microbiology results: Pending  Thank you for allowing pharmacy to be a part of this patient's care.  Natacha Jepsen, Drake LeachRachel Lynn 08/23/2015 10:37 PM

## 2015-08-24 ENCOUNTER — Observation Stay (HOSPITAL_BASED_OUTPATIENT_CLINIC_OR_DEPARTMENT_OTHER): Payer: Commercial Managed Care - HMO

## 2015-08-24 ENCOUNTER — Encounter (HOSPITAL_BASED_OUTPATIENT_CLINIC_OR_DEPARTMENT_OTHER): Payer: Self-pay | Admitting: Emergency Medicine

## 2015-08-24 DIAGNOSIS — E119 Type 2 diabetes mellitus without complications: Secondary | ICD-10-CM

## 2015-08-24 DIAGNOSIS — M109 Gout, unspecified: Secondary | ICD-10-CM | POA: Diagnosis present

## 2015-08-24 DIAGNOSIS — M793 Panniculitis, unspecified: Principal | ICD-10-CM

## 2015-08-24 DIAGNOSIS — I110 Hypertensive heart disease with heart failure: Secondary | ICD-10-CM | POA: Diagnosis present

## 2015-08-24 DIAGNOSIS — Z9981 Dependence on supplemental oxygen: Secondary | ICD-10-CM | POA: Diagnosis not present

## 2015-08-24 DIAGNOSIS — E876 Hypokalemia: Secondary | ICD-10-CM | POA: Diagnosis present

## 2015-08-24 DIAGNOSIS — Z888 Allergy status to other drugs, medicaments and biological substances status: Secondary | ICD-10-CM | POA: Diagnosis not present

## 2015-08-24 DIAGNOSIS — Z79899 Other long term (current) drug therapy: Secondary | ICD-10-CM | POA: Diagnosis not present

## 2015-08-24 DIAGNOSIS — I509 Heart failure, unspecified: Secondary | ICD-10-CM | POA: Diagnosis not present

## 2015-08-24 DIAGNOSIS — I10 Essential (primary) hypertension: Secondary | ICD-10-CM | POA: Diagnosis present

## 2015-08-24 DIAGNOSIS — R7989 Other specified abnormal findings of blood chemistry: Secondary | ICD-10-CM

## 2015-08-24 DIAGNOSIS — E785 Hyperlipidemia, unspecified: Secondary | ICD-10-CM | POA: Diagnosis present

## 2015-08-24 DIAGNOSIS — Z794 Long term (current) use of insulin: Secondary | ICD-10-CM | POA: Diagnosis not present

## 2015-08-24 DIAGNOSIS — R0602 Shortness of breath: Secondary | ICD-10-CM | POA: Diagnosis present

## 2015-08-24 DIAGNOSIS — I5033 Acute on chronic diastolic (congestive) heart failure: Secondary | ICD-10-CM | POA: Diagnosis present

## 2015-08-24 DIAGNOSIS — Z833 Family history of diabetes mellitus: Secondary | ICD-10-CM | POA: Diagnosis not present

## 2015-08-24 DIAGNOSIS — R778 Other specified abnormalities of plasma proteins: Secondary | ICD-10-CM | POA: Diagnosis present

## 2015-08-24 DIAGNOSIS — Z8249 Family history of ischemic heart disease and other diseases of the circulatory system: Secondary | ICD-10-CM | POA: Diagnosis not present

## 2015-08-24 DIAGNOSIS — I159 Secondary hypertension, unspecified: Secondary | ICD-10-CM | POA: Diagnosis present

## 2015-08-24 DIAGNOSIS — Z6841 Body Mass Index (BMI) 40.0 and over, adult: Secondary | ICD-10-CM | POA: Diagnosis not present

## 2015-08-24 DIAGNOSIS — J45909 Unspecified asthma, uncomplicated: Secondary | ICD-10-CM | POA: Diagnosis present

## 2015-08-24 DIAGNOSIS — E7800 Pure hypercholesterolemia, unspecified: Secondary | ICD-10-CM | POA: Diagnosis present

## 2015-08-24 DIAGNOSIS — R748 Abnormal levels of other serum enzymes: Secondary | ICD-10-CM | POA: Diagnosis present

## 2015-08-24 DIAGNOSIS — Z7982 Long term (current) use of aspirin: Secondary | ICD-10-CM | POA: Diagnosis not present

## 2015-08-24 HISTORY — DX: Hypokalemia: E87.6

## 2015-08-24 HISTORY — DX: Shortness of breath: R06.02

## 2015-08-24 HISTORY — DX: Other specified abnormalities of plasma proteins: R77.8

## 2015-08-24 HISTORY — DX: Other specified abnormal findings of blood chemistry: R79.89

## 2015-08-24 LAB — APTT: aPTT: 33 seconds (ref 24–36)

## 2015-08-24 LAB — LIPID PANEL
CHOLESTEROL: 181 mg/dL (ref 0–200)
HDL: 43 mg/dL (ref >40)
LDL Cholesterol: 118 mg/dL — ABNORMAL HIGH (ref 0–99)
TRIGLYCERIDES: 100 mg/dL (ref <150)
Total CHOL/HDL Ratio: 4.2 RATIO
VLDL: 20 mg/dL (ref 0–40)

## 2015-08-24 LAB — BASIC METABOLIC PANEL
ANION GAP: 10 (ref 5–15)
BUN: 15 mg/dL (ref 6–20)
CHLORIDE: 98 mmol/L — AB (ref 101–111)
CO2: 31 mmol/L (ref 22–32)
Calcium: 8.5 mg/dL — ABNORMAL LOW (ref 8.9–10.3)
Creatinine, Ser: 0.88 mg/dL (ref 0.44–1.00)
GFR calc Af Amer: >60 mL/min (ref >60)
GFR calc non Af Amer: >60 mL/min (ref >60)
GLUCOSE: 229 mg/dL — AB (ref 65–99)
POTASSIUM: 3.4 mmol/L — AB (ref 3.5–5.1)
Sodium: 139 mmol/L (ref 135–145)

## 2015-08-24 LAB — GLUCOSE, CAPILLARY
Glucose-Capillary: 137 mg/dL — ABNORMAL HIGH (ref 65–99)
Glucose-Capillary: 182 mg/dL — ABNORMAL HIGH (ref 65–99)
Glucose-Capillary: 180 mg/dL — ABNORMAL HIGH (ref 65–99)
Glucose-Capillary: 209 mg/dL — ABNORMAL HIGH (ref 65–99)
Glucose-Capillary: 267 mg/dL — ABNORMAL HIGH (ref 65–99)

## 2015-08-24 LAB — PROTIME-INR
INR: 1.11
PROTHROMBIN TIME: 14.3 s (ref 11.4–15.2)

## 2015-08-24 LAB — ECHOCARDIOGRAM COMPLETE
HEIGHTINCHES: 63.5 in
Weight: 5202.86 oz

## 2015-08-24 LAB — MAGNESIUM: Magnesium: 1.6 mg/dL — ABNORMAL LOW (ref 1.7–2.4)

## 2015-08-24 LAB — TROPONIN I
TROPONIN I: 0.03 ng/mL — AB (ref <0.03)
Troponin I: 0.03 ng/mL (ref <0.03)

## 2015-08-24 LAB — C-REACTIVE PROTEIN: CRP: 18.6 mg/dL — ABNORMAL HIGH (ref <1.0)

## 2015-08-24 LAB — SEDIMENTATION RATE: SED RATE: 60 mm/h — AB (ref 0–22)

## 2015-08-24 LAB — HIV ANTIBODY (ROUTINE TESTING W REFLEX): HIV SCREEN 4TH GENERATION: NONREACTIVE

## 2015-08-24 MED ORDER — NITROGLYCERIN 0.4 MG SL SUBL: 0.4000 mg | SUBLINGUAL_TABLET | SUBLINGUAL | Status: DC | PRN

## 2015-08-24 MED ORDER — CYCLOBENZAPRINE HCL 5 MG PO TABS
5.0000 mg | ORAL_TABLET | Freq: Three times a day (TID) | ORAL | Status: DC | PRN
  Administered 2015-08-24 – 2015-08-28 (×4): 5 mg via ORAL
  Filled 2015-08-24 (×5): qty 1

## 2015-08-24 MED ORDER — ALLOPURINOL 300 MG PO TABS
300.0000 mg | ORAL_TABLET | Freq: Every day | ORAL | Status: DC
  Administered 2015-08-24 – 2015-08-28 (×5): 300 mg via ORAL
  Filled 2015-08-24 (×5): qty 1

## 2015-08-24 MED ORDER — CHLORTHALIDONE 25 MG PO TABS
25.0000 mg | ORAL_TABLET | Freq: Every day | ORAL | Status: DC
  Administered 2015-08-24 – 2015-08-28 (×5): 25 mg via ORAL
  Filled 2015-08-24 (×5): qty 1

## 2015-08-24 MED ORDER — OXYCODONE-ACETAMINOPHEN 5-325 MG PO TABS: 1.0000 | ORAL_TABLET | ORAL | Status: DC | PRN

## 2015-08-24 MED ORDER — ENOXAPARIN SODIUM 80 MG/0.8ML ~~LOC~~ SOLN
70.0000 mg | Freq: Every day | SUBCUTANEOUS | Status: DC
  Administered 2015-08-24 – 2015-08-28 (×5): 70 mg via SUBCUTANEOUS
  Filled 2015-08-24 (×5): qty 0.8

## 2015-08-24 MED ORDER — ASPIRIN EC 325 MG PO TBEC
325.0000 mg | DELAYED_RELEASE_TABLET | Freq: Every day | ORAL | Status: DC
  Administered 2015-08-24 – 2015-08-28 (×5): 325 mg via ORAL
  Filled 2015-08-24 (×6): qty 1

## 2015-08-24 MED ORDER — MAGNESIUM SULFATE 2 GM/50ML IV SOLN
2.0000 g | Freq: Once | INTRAVENOUS | Status: AC
  Administered 2015-08-24: 2 g via INTRAVENOUS
  Filled 2015-08-24: qty 50

## 2015-08-24 MED ORDER — INSULIN ASPART 100 UNIT/ML ~~LOC~~ SOLN
0.0000 [IU] | Freq: Three times a day (TID) | SUBCUTANEOUS | Status: DC
  Administered 2015-08-24: 3 [IU] via SUBCUTANEOUS
  Administered 2015-08-24 – 2015-08-27 (×10): 2 [IU] via SUBCUTANEOUS
  Administered 2015-08-28: 1 [IU] via SUBCUTANEOUS
  Administered 2015-08-28: 2 [IU] via SUBCUTANEOUS

## 2015-08-24 MED ORDER — IBUPROFEN 600 MG PO TABS: 600.0000 mg | ORAL_TABLET | Freq: Four times a day (QID) | ORAL | Status: DC | PRN

## 2015-08-24 MED ORDER — FUROSEMIDE 10 MG/ML IJ SOLN
40.0000 mg | Freq: Two times a day (BID) | INTRAMUSCULAR | Status: DC
  Administered 2015-08-24 – 2015-08-28 (×9): 40 mg via INTRAVENOUS
  Filled 2015-08-24 (×10): qty 4

## 2015-08-24 MED ORDER — MONTELUKAST SODIUM 10 MG PO TABS
10.0000 mg | ORAL_TABLET | Freq: Every day | ORAL | Status: DC
  Administered 2015-08-24 – 2015-08-27 (×5): 10 mg via ORAL
  Filled 2015-08-24 (×5): qty 1

## 2015-08-24 MED ORDER — DM-GUAIFENESIN ER 30-600 MG PO TB12: 1.0000 | ORAL_TABLET | Freq: Two times a day (BID) | ORAL | Status: DC | PRN

## 2015-08-24 MED ORDER — POTASSIUM CHLORIDE CRYS ER 20 MEQ PO TBCR
40.0000 meq | EXTENDED_RELEASE_TABLET | Freq: Once | ORAL | Status: AC
  Administered 2015-08-24: 40 meq via ORAL
  Filled 2015-08-24: qty 2

## 2015-08-24 MED ORDER — ATENOLOL-CHLORTHALIDONE 100-25 MG PO TABS: 1.0000 | ORAL_TABLET | Freq: Every day | ORAL | Status: DC

## 2015-08-24 MED ORDER — AMLODIPINE BESYLATE 10 MG PO TABS
10.0000 mg | ORAL_TABLET | Freq: Every day | ORAL | Status: DC
Start: 2015-08-24 — End: 2015-08-28
  Administered 2015-08-24 – 2015-08-28 (×3): 10 mg via ORAL
  Filled 2015-08-24 (×5): qty 1

## 2015-08-24 MED ORDER — ONDANSETRON HCL 4 MG/2ML IJ SOLN: 4.0000 mg | Freq: Four times a day (QID) | INTRAMUSCULAR | Status: DC | PRN

## 2015-08-24 MED ORDER — INSULIN GLARGINE 100 UNIT/ML ~~LOC~~ SOLN
30.0000 [IU] | Freq: Two times a day (BID) | SUBCUTANEOUS | Status: DC
  Administered 2015-08-24 – 2015-08-28 (×9): 30 [IU] via SUBCUTANEOUS
  Filled 2015-08-24 (×10): qty 0.3

## 2015-08-24 MED ORDER — VANCOMYCIN HCL 10 G IV SOLR
1250.0000 mg | Freq: Two times a day (BID) | INTRAVENOUS | Status: AC
  Administered 2015-08-24 – 2015-08-27 (×8): 1250 mg via INTRAVENOUS
  Filled 2015-08-24 (×9): qty 1250

## 2015-08-24 MED ORDER — ACETAMINOPHEN 325 MG PO TABS: 650.0000 mg | ORAL_TABLET | ORAL | Status: DC | PRN

## 2015-08-24 MED ORDER — ALBUTEROL SULFATE (2.5 MG/3ML) 0.083% IN NEBU
2.5000 mg | INHALATION_SOLUTION | RESPIRATORY_TRACT | Status: DC | PRN
  Administered 2015-08-24: 2.5 mg via RESPIRATORY_TRACT
  Filled 2015-08-24: qty 3

## 2015-08-24 MED ORDER — ATENOLOL 50 MG PO TABS
100.0000 mg | ORAL_TABLET | Freq: Every day | ORAL | Status: DC
  Administered 2015-08-24 – 2015-08-28 (×5): 100 mg via ORAL
  Filled 2015-08-24 (×5): qty 2

## 2015-08-24 MED ORDER — SODIUM CHLORIDE 0.9% FLUSH
3.0000 mL | Freq: Two times a day (BID) | INTRAVENOUS | Status: DC
  Administered 2015-08-24 – 2015-08-26 (×4): 3 mL via INTRAVENOUS

## 2015-08-24 MED ORDER — POTASSIUM CHLORIDE 20 MEQ/15ML (10%) PO SOLN
40.0000 meq | Freq: Once | ORAL | Status: AC
  Administered 2015-08-24: 40 meq via ORAL
  Filled 2015-08-24: qty 30

## 2015-08-24 MED ORDER — LOSARTAN POTASSIUM 50 MG PO TABS
50.0000 mg | ORAL_TABLET | Freq: Every day | ORAL | Status: DC
  Administered 2015-08-24 – 2015-08-28 (×4): 50 mg via ORAL
  Filled 2015-08-24 (×5): qty 1

## 2015-08-24 MED ORDER — SODIUM CHLORIDE 0.9% FLUSH: 3.0000 mL | INTRAVENOUS | Status: DC | PRN

## 2015-08-24 MED ORDER — POTASSIUM CHLORIDE CRYS ER 20 MEQ PO TBCR
20.0000 meq | EXTENDED_RELEASE_TABLET | Freq: Two times a day (BID) | ORAL | Status: DC
  Administered 2015-08-24 – 2015-08-25 (×3): 20 meq via ORAL
  Filled 2015-08-24 (×3): qty 1

## 2015-08-24 MED ORDER — SODIUM CHLORIDE 0.9 % IV SOLN: 250.0000 mL | INTRAVENOUS | Status: DC | PRN

## 2015-08-24 MED ORDER — MORPHINE SULFATE (PF) 2 MG/ML IV SOLN: 2.0000 mg | INTRAVENOUS | Status: DC | PRN

## 2015-08-24 MED ORDER — ENOXAPARIN SODIUM 40 MG/0.4ML ~~LOC~~ SOLN: 40.0000 mg | Freq: Every day | SUBCUTANEOUS | Status: DC

## 2015-08-24 MED ORDER — INSULIN ASPART PROT & ASPART (70-30 MIX) 100 UNIT/ML ~~LOC~~ SUSP
35.0000 [IU] | Freq: Two times a day (BID) | SUBCUTANEOUS | Status: DC
  Filled 2015-08-24: qty 10

## 2015-08-24 MED ORDER — VITAMIN D 1000 UNITS PO TABS
1000.0000 [IU] | ORAL_TABLET | Freq: Every day | ORAL | Status: DC
  Administered 2015-08-24 – 2015-08-28 (×5): 1000 [IU] via ORAL
  Filled 2015-08-24 (×5): qty 1

## 2015-08-24 NOTE — H&P (Signed)
History and Physical    Morgan BeachJulia Fischer NWG:[id number] DOB: 01/23/1950 DOA: 08/23/2015  Referring MD/NP/PA:   PCP: Jackie PlumSEI-BONSU,GEORGE, MD   Patient coming from:  The patient is coming from home.  At baseline, pt is independent for most of ADL.        Chief Complaint: Abdominal wall tenderness, fever, shortness of breath  HPI: Morgan Fischer is a 65 y.o. female with medical history significant of hypertension, hyperlipidemia, diabetes mellitus, asthma, on home oxygen, gout, who presents with lower abdominal wall pain and redness and shortness of breath.   Patient reports that she has lower abdominal wall tenderness, redness and warmth for 3 days. She also has fever and chills. He had temperature 101 at one time at home. Patient also has worsening shortness of breath, which is worse when lying down. She has dry cough, but no chest pain. No tenderness over calf areas. Patient denies nausea, vomiting, diarrhea, abdominal pain. No symptoms of UTI.  ED Course: pt was found to have WBC 12.3, lactate 1.18, troponin 0.04, BNP 395.9, potassium 2.7, creatinine normal, temperature 98.2. CXR showed pulmonary vascular congestion. CT abdomen/pelvis showed panniculitis. EKG showed T-wave inversion in lateral leads and V4-V5. Pt was given 40 mg of IV lasix in ED. IV vanco and zosyn were started. Pt is placed on tele bed for obs.  Review of Systems:   General: has subjective fevers, chills, no changes in body weight, has poor appetite, has fatigue HEENT: no blurry vision, hearing changes or sore throat Respiratory: has dyspnea, coughing, no wheezing CV: no chest pain, no palpitations GI: no nausea, vomiting, abdominal pain, diarrhea, constipation GU: no dysuria, burning on urination, increased urinary frequency, hematuria  Ext: has leg edema Neuro: no unilateral weakness, numbness, or tingling, no vision change or hearing loss Skin: no rash. Has abdominal wall erythema and tenderness MSK: No muscle spasm, no  deformity, no limitation of range of movement in spin Heme: No easy bruising.  Travel history: No recent long distant travel.  Allergy:  Allergies  Allergen Reactions  . Ciprocinonide [Fluocinolone] Other (See Comments)    Kidney failure  . Catapres [Clonidine Hcl] Rash  . Demadex [Torsemide] Rash    Past Medical History:  Diagnosis Date  . Arthritis   . Asthma   . Diabetes mellitus without complication (HCC)   . High cholesterol   . Hypertension   . Morbid obesity (HCC)     Past Surgical History:  Procedure Laterality Date  . ABDOMINAL HYSTERECTOMY    . CESAREAN SECTION      Social History:  reports that she has never smoked. She does not have any smokeless tobacco history on file. She reports that she does not drink alcohol. Her drug history is not on file.  Family History:  Family History  Problem Relation Age of Onset  . Diabetes Mellitus I Mother   . CAD Mother   . Heart disease Father   . Breast cancer Sister      Prior to Admission medications   Medication Sig Start Date End Date Taking? Authorizing Provider  allopurinol (ZYLOPRIM) 300 MG tablet Take 300 mg by mouth daily.    Historical Provider, MD  amLODipine (NORVASC) 10 MG tablet Take 10 mg by mouth daily.    Historical Provider, MD  aspirin 325 MG EC tablet Take 325 mg by mouth daily.    Historical Provider, MD  atenolol-chlorthalidone (TENORETIC) 100-25 MG per tablet Take 1 tablet by mouth daily.    Historical Provider, MD  cholecalciferol (VITAMIN D) 1000 UNITS tablet Take 1,000 Units by mouth daily.    Historical Provider, MD  cyclobenzaprine (FLEXERIL) 5 MG tablet Take 1 tablet (5 mg total) by mouth 3 (three) times daily as needed for muscle spasms. 07/05/13   Narda Bonds, MD  furosemide (LASIX) 20 MG tablet Take 230 mg by mouth 2 (two) times daily.    Historical Provider, MD  glimepiride (AMARYL) 4 MG tablet Take 4 mg by mouth 2 (two) times daily.    Historical Provider, MD  ibuprofen (ADVIL,MOTRIN)  600 MG tablet Take 1 tablet (600 mg total) by mouth every 6 (six) hours as needed. 07/05/13   Narda Bonds, MD  insulin NPH-regular Human (NOVOLIN 70/30) (70-30) 100 UNIT/ML injection Inject 50 Units into the skin 2 (two) times daily with a meal.    Historical Provider, MD  losartan (COZAAR) 50 MG tablet Take 50 mg by mouth daily.    Historical Provider, MD  montelukast (SINGULAIR) 10 MG tablet Take 10 mg by mouth at bedtime.    Historical Provider, MD  nateglinide (STARLIX) 60 MG tablet Take 60 mg by mouth 3 (three) times daily with meals.    Historical Provider, MD  potassium chloride SA (K-DUR,KLOR-CON) 20 MEQ tablet Take 20 mEq by mouth 2 (two) times daily.    Historical Provider, MD  sitaGLIPtin-metformin (JANUMET) 50-1000 MG per tablet Take 1 tablet by mouth once.    Historical Provider, MD    Physical Exam: Vitals:   08/24/15 0030 08/24/15 0105 08/24/15 0223 08/24/15 0533  BP: 124/72 135/67 129/62 (!) 133/58  Pulse: 79 82 81 76  Resp: 25 22 (!) 23 (!) 21  Temp:   98.5 F (36.9 C) 98.6 F (37 C)  TempSrc:   Oral Oral  SpO2: 99% 97% 100% 92%  Weight:   (!) 147.5 kg (325 lb 2.9 oz)   Height:   5' 3.5" (1.613 m)    General: Not in acute distress HEENT:       Eyes: PERRL, EOMI, no scleral icterus.       ENT: No discharge from the ears and nose, no pharynx injection, no tonsillar enlargement.        Neck: difficult to assess JVD due to obesity, no bruit, no mass felt. Heme: No neck lymph node enlargement. Cardiac: S1/S2, RRR, has 2/6 systolic murmurs, No gallops or rubs. Respiratory: Has decreased air movement bilaterally, worse on the right side. No rales, wheezing, rhonchi or rubs. GI: Soft, nondistended, nontender, no rebound pain, no organomegaly, BS present. GU: No hematuria Ext: 1+ pitting leg edema bilaterally. 2+DP/PT pulse bilaterally. Musculoskeletal: No joint deformities, No joint redness or warmth, no limitation of ROM in spin. Skin: has tenderness, warmth, erythema in  the lower abdomen wall Neuro: Alert, oriented X3, cranial nerves II-XII grossly intact, moves all extremities normally. Psych: Patient is not psychotic, no suicidal or hemocidal ideation.  Labs on Admission: I have personally reviewed following labs and imaging studies  CBC:  Recent Labs Lab 08/23/15 2120  WBC 12.3*  NEUTROABS 10.5*  HGB 11.5*  HCT 35.4*  MCV 86.3  PLT 120*   Basic Metabolic Panel:  Recent Labs Lab 08/23/15 2120  NA 136  K 2.7*  CL 95*  CO2 32  GLUCOSE 122*  BUN 20  CREATININE 0.69  CALCIUM 8.7*  MG 1.4*   GFR: Estimated Creatinine Clearance: 99.6 mL/min (by C-G formula based on SCr of 0.8 mg/dL). Liver Function Tests: No results for input(s): AST, ALT,  ALKPHOS, BILITOT, PROT, ALBUMIN in the last 168 hours. No results for input(s): LIPASE, AMYLASE in the last 168 hours. No results for input(s): AMMONIA in the last 168 hours. Coagulation Profile:  Recent Labs Lab 08/24/15 0422  INR 1.11   Cardiac Enzymes:  Recent Labs Lab 08/23/15 2120 08/24/15 0422  TROPONINI 0.04* 0.03*   BNP (last 3 results) No results for input(s): PROBNP in the last 8760 hours. HbA1C: No results for input(s): HGBA1C in the last 72 hours. CBG:  Recent Labs Lab 08/24/15 0251  GLUCAP 137*   Lipid Profile:  Recent Labs  08/24/15 0422  CHOL 181  HDL 43  LDLCALC 118*  TRIG 100  CHOLHDL 4.2   Thyroid Function Tests: No results for input(s): TSH, T4TOTAL, FREET4, T3FREE, THYROIDAB in the last 72 hours. Anemia Panel: No results for input(s): VITAMINB12, FOLATE, FERRITIN, TIBC, IRON, RETICCTPCT in the last 72 hours. Urine analysis: No results found for: COLORURINE, APPEARANCEUR, LABSPEC, PHURINE, GLUCOSEU, HGBUR, BILIRUBINUR, KETONESUR, PROTEINUR, UROBILINOGEN, NITRITE, LEUKOCYTESUR Sepsis Labs: @LABRCNTIP (procalcitonin:4,lacticidven:4) )No results found for this or any previous visit (from the past 240 hour(s)).   Radiological Exams on Admission: Dg  Chest 2 View  Result Date: 08/23/2015 CLINICAL DATA:  Chronic shortness of breath, hypoxia EXAM: CHEST  2 VIEW COMPARISON:  10/06/2007 FINDINGS: Cardiomegaly with pulmonary vascular congestion. No frank interstitial edema. Mild right basilar scarring/ atelectasis. No pleural effusion or pneumothorax. Mild degenerative changes of the visualized thoracolumbar spine. IMPRESSION: Cardiomegaly with pulmonary vascular congestion. No frank interstitial edema. Mild right basilar scarring/ atelectasis, chronic. Electronically Signed   By: Charline Bills M.D.   On: 08/23/2015 21:39   Ct Abdomen Pelvis W Contrast  Result Date: 08/23/2015 CLINICAL DATA:  Lower abdominal redness, warmth and tenderness for days. Panniculitis or deep infection. EXAM: CT ABDOMEN AND PELVIS WITH CONTRAST TECHNIQUE: Multidetector CT imaging of the abdomen and pelvis was performed using the standard protocol following bolus administration of intravenous contrast. CONTRAST:  ISOVUE-300 IOPAMIDOL (ISOVUE-300) INJECTION 61% COMPARISON:  None. FINDINGS: Lower chest: The included lung bases are clear. Multi chamber cardiomegaly. There are coronary artery calcifications. Mild motion artifact. Liver: Enlarged measuring 24 cm cranial caudal. There is an 11 mm hypodensity in left lobe, incompletely characterized, but likely cyst. Hypodensity adjacent with falciform ligament is likely focal fatty infiltration. Hepatobiliary: Gallbladder physiologically distended. Probable intraluminal gallstone. No pericholecystic inflammation. No biliary dilatation. Pancreas: No ductal dilatation or inflammation. Spleen: Normal. Adrenal glands: No nodule. Kidneys: Symmetric renal enhancement. No hydronephrosis. Symmetric excretion on delayed phase imaging. There is mild nonspecific perinephric edema about the lower poles bilaterally. Stomach/Bowel: Stomach is decompressed. There are no dilated or thickened small bowel loops. Small volume of stool throughout the  colon without colonic wall thickening. Minimal diverticulosis of the descending colon without acute diverticulitis. The appendix is tentatively but not definitively identified. No pericecal inflammation. Vascular/Lymphatic: Prominent right external iliac node measures 14 mm short axis. Similar prominent left external iliac node. Increased number of multiple bilateral inguinal lymph nodes. No retroperitoneal adenopathy. Abdominal aorta is normal in caliber. Mild atherosclerosis of the abdominal aorta and its branches, no aneurysm. Reproductive: Post hysterectomy.  No adnexal mass. Bladder: Minimally distended without wall thickening. Other: Large lower abdominal pannus demonstrates marked skin thickening and subcutaneous reticulation. There is no focal fluid collection. No soft tissue air. No evidence of necrotizing soft tissue infection. There is air small fat containing umbilical hernia. Prominent vessels in the subcutaneous pannus. No peroneal inflammation. New free intra-abdominal air or free fluid. Musculoskeletal:  There are no acute or suspicious osseous abnormalities. Multilevel degenerative change throughout the spine. There is laxity and atrophy of the anterior abdominal wall musculature. IMPRESSION: 1. Superficial panniculitis involving the inferior abdominal pannus with skin thickening and soft tissue edema. No focal fluid collection or evidence of evidence of deep soft tissue infection. Prominent inguinal and external iliac nodes are likely reactive. 2. No acute intra-abdominal/pelvic abnormality. 3. Incidental finding of hepatomegaly. Probable cyst on the left lobe of the liver. Minimal colonic diverticulosis in the descending colon without diverticulitis. Electronically Signed   By: Rubye OaksMelanie  Ehinger M.D.   On: 08/23/2015 22:40     EKG: Independently reviewed. Sinus rhythm, QTC 462, T-wave inversion in lateral leads and V4-V5  Assessment/Plan Principal Problem:   Panniculitis Active Problems:    Elevated troponin   Morbid obesity (HCC)   Hypertension   Diabetes mellitus without complication (HCC)   Asthma   SOB (shortness of breath)   Hypokalemia   Hypomagnesemia   Abdominal wall Panniculitis: CT-abdomen/pelvis showed superficial panniculitis involving the inferior abdominal pannus, no abscess formation. Patient has leukocytosis, but does not have sepsis clinically. Lactate is normal. Hemodynamically stable.  -will place on tele bed for obs -IV Vanco and zosyn were started in ED, will continue. -f/u Blood culture x 2 -prn Percocet and ibuprofen for pain  SOB: Etiology is not clear. Chest x-ray showed pulmonary vascular congestion. BNP is elevated at 395.9. Patient has bilateral leg edema, clinically seems to have CHF. No 2d echo on record. No hx of congestive heart failure, but patient is on Lasix 40 mg in the morning and 20 mg in the evening for leg edema per patient. -Lasix 40 mg bid by IV -trop x 3 -2d echo -Continue aspirin, atenolol-chlorthalidone, and Cozaar -Daily weights -strict I/O's -Low salt diet  Elevated troponin: Troponin 0.04. No chest pain. Most likely due to demanding ischemia secondary to possible CHF. -On aspirin, atenolol -When necessary morphine and nitroglycerin if develops chest pain -Follow-up troponin 3 and 2-D echo  HTN: -on IV lasix as above -continue Cozaar, amlodipine, atenolol, chlorthalidone  DM-II: Last A1c not on record. Patient is taking Janumet, Nateglinide, Amaryl, insulin 70/30 at home -will decrease insulin 70/30 dose from  50-35 units twice a day -SSI -Check A1c  Asthma: no wheezing on auscultation. Does not seem to have acute exacerbation -When necessary albuterol nebulizer  Hypokalemia and  Hypomagnesemia: Potassium 2.7, magnesium of 1.4 -Repleted both  DVT ppx: SQ Lovenox Code Status: Full code Family Communication: None at bed side.  Disposition Plan:  Anticipate discharge back to previous home environment Consults  called:  none Admission status: Obs / tele     Date of Service 08/24/2015    Lorretta HarpIU, Glennice Marcos [facility] Pager [phone]  If 7PM-7AM, please contact night-coverage [email or web address] Password [email or web address] 08/24/2015, 7:31 AM

## 2015-08-24 NOTE — Progress Notes (Signed)
  Echocardiogram 2D Echocardiogram has been performed.  Morgan Fischer, Morgan Fischer 08/24/2015, 12:46 PM

## 2015-08-24 NOTE — Care Management Obs Status (Signed)
MEDICARE OBSERVATION STATUS NOTIFICATION   Patient Details  Name: Morgan BeachJulia Fischer MRN: [id number] Date of Birth: 03/11/1950   Medicare Observation Status Notification Given:  Yes    Jayme Mednick, Annamarie MajorCheryl U, RN 08/24/2015, 10:52 AM

## 2015-08-24 NOTE — Evaluation (Signed)
Physical Therapy Evaluation Patient Details Name: Morgan Fischer MRN: [id number] DOB: 11/18/1950 Today's Date: 08/24/2015   History of Present Illness  65 yo female with onset of abdominal edema and erythema diagnosed as panniculitis, had elevated troponins and liver enlargement, diverticulosis.  PHx:  O2 2L at baseline, HTN, gout,   Clinical Impression  [facility] recommended she upgrade to RW.  Due to constraints of her home setting she feels it is easier to maneuver on cane.  Her plan is to progress in acute care as able with strengthening and balance/gait to increase her control of standing and ease the burden of managing on SPC.    Follow Up Recommendations Home health PT;Supervision for mobility/OOB    Equipment Recommendations  Rolling walker with 5" wheels (if pt will agree to this)    Recommendations for Other Services Rehab consult     Precautions / Restrictions Precautions Precautions: Fall (telemetry) Precaution Comments: using cane at baseline Restrictions Weight Bearing Restrictions: No      Mobility  Bed Mobility Overal bed mobility: Modified Independent             General bed mobility comments: side of bed when PT entered  Transfers Overall transfer level: Needs assistance Equipment used: 1 person hand held assist;Straight cane Transfers: Sit to/from BJ'sStand;Stand Pivot Transfers Sit to Stand: Min guard;Min assist Stand pivot transfers: Min guard;Min assist       General transfer comment: reminded for hand placement  Ambulation/Gait Ambulation/Gait assistance: Min assist;Min guard Ambulation Distance (Feet): 90 Feet Assistive device: 1 person hand held assist;Straight cane Gait Pattern/deviations: Step-through pattern;Decreased stride length;Wide base of support (lateral shifting ) Gait velocity: reduced Gait velocity interpretation: Below normal speed for age/gender    Stairs Stairs:  (will not need them to enter house)           Wheelchair Mobility    Modified Rankin (Stroke Patients Only)       Balance Overall balance assessment: Needs assistance Sitting-balance support: Feet supported Sitting balance-Leahy Scale: Good   Postural control: Posterior lean Standing balance support: Single extremity supported;Bilateral upper extremity supported Standing balance-Leahy Scale: Fair Standing balance comment: less than fair dynamic balance                             Pertinent Vitals/Pain Pain Assessment: Faces Faces Pain Scale: Hurts a little bit Pain Location: abdomen Pain Intervention(s): Monitored during session;Repositioned    Home Living Family/patient expects to be discharged to:: Private residence Living Arrangements: Spouse/significant other Available Help at Discharge: Family;Available 24 hours/day Type of Home: House Home Access: Ramped entrance     Home Layout: One level Home Equipment: Cane - single point      Prior Function Level of Independence: Independent with assistive device(s)               Hand Dominance        Extremity/Trunk Assessment   Upper Extremity Assessment: Overall WFL for tasks assessed           Lower Extremity Assessment: Generalized weakness      Cervical / Trunk Assessment: Normal  Communication   Communication: No difficulties  Cognition Arousal/Alertness: Awake/alert Behavior During Therapy: WFL for tasks assessed/performed Overall Cognitive Status: Within Functional Limits for tasks assessed                      General Comments General comments (  skin integrity, edema, etc.): Pt is demonstrating some fall risk with use of the SPC, but declines to use RW. PT reiterated the benefits and will ask again, did inform nursing of same.    Exercises        Assessment/Plan    PT Assessment Patient needs continued PT services  PT Diagnosis Difficulty walking;Generalized weakness   PT Problem List Decreased  range of motion;Decreased strength;Decreased activity tolerance;Decreased balance;Decreased mobility;Decreased coordination;Decreased knowledge of use of DME;Decreased safety awareness;Cardiopulmonary status limiting activity;Obesity  PT Treatment Interventions DME instruction;Gait training;Functional mobility training;Therapeutic activities;Therapeutic exercise;Balance training;Neuromuscular re-education;Patient/family education   PT Goals (Current goals can be found in the Care Plan section) Acute Rehab PT Goals Patient Stated Goal: to walk and go home PT Goal Formulation: With patient Time For Goal Achievement: 09/07/15 Potential to Achieve Goals: Good    Frequency Min 3X/week   Barriers to discharge Inaccessible home environment has unlevel entrance    Co-evaluation               End of Session Equipment Utilized During Treatment: Oxygen (3L per pt request as she elevates baseline with effort) Activity Tolerance: Patient tolerated treatment well Patient left: in chair;with call bell/phone within reach;with nursing/sitter in room Nurse Communication: Mobility status    Functional Assessment Tool Used: clinical judgment Functional Limitation: Mobility: Walking and moving around Mobility: Walking and Moving Around Current Status ([id number]): At least 40 percent but less than 60 percent impaired, limited or restricted Mobility: Walking and Moving Around Goal Status [phone]([id number]): At least 1 percent but less than 20 percent impaired, limited or restricted    Time: 1004-1027 PT Time Calculation (min) (ACUTE ONLY): 23 min   Charges:   PT Evaluation $PT Eval Low Complexity: 1 Procedure PT Treatments $Gait Training: 8-22 mins   PT G Codes:   PT G-Codes **NOT FOR INPATIENT CLASS** Functional Assessment Tool Used: clinical judgment Functional Limitation: Mobility: Walking and moving around Mobility: Walking and Moving Around Current Status ([id number]): At least 40 percent but less than 60  percent impaired, limited or restricted Mobility: Walking and Moving Around Goal Status [phone](G8979): At least 1 percent but less than 20 percent impaired, limited or restricted    Ivar DrapeStout, Lalania Haseman E 08/24/2015, 11:36 AM    Samul Dadauth Johny Pitstick, PT MS Acute Rehab Dept. Number: [facility] [phone] and [facility] [phone]

## 2015-08-24 NOTE — ED Notes (Signed)
Second bag of Potassium, in , complete at this time.

## 2015-08-24 NOTE — Progress Notes (Signed)
PROGRESS NOTE                                                                                                                                                                                                             Patient Demographics:    Morgan Fischer, is a 65 y.o. female, DOB - 09/11/1950, ZOX:[id number]  Admit date - 08/23/2015   Admitting Physician Lorretta Harp, MD  Outpatient Primary MD for the patient is OSEI-BONSU,GEORGE, MD  LOS - 0  Chief Complaint  Patient presents with  . Cellulitis       Brief Narrative     Morgan Fischer is a 65 y.o. female with medical history significant of hypertension, hyperlipidemia, diabetes mellitus, asthma, on home oxygen, gout, who presents with lower abdominal wall pain and redness and shortness of breath.   Patient reports that she has lower abdominal wall tenderness, redness and warmth for 3 days. She also has fever and chills. He had temperature 101 at one time at home. Patient also has worsening shortness of breath, which is worse when lying down. She has dry cough, but no chest pain. No tenderness over calf areas. Patient denies nausea, vomiting, diarrhea, abdominal pain. No symptoms of UTI.  ED Course: pt was found to have WBC 12.3, lactate 1.18, troponin 0.04, BNP 395.9, potassium 2.7, creatinine normal, temperature 98.2. CXR showedpulmonary vascular congestion. CT abdomen/pelvis showed panniculitis.EKG showed T-wave inversion in lateral leads and V4-V5. Pt was given 40 mg of IV lasix in ED. IV vanco and zosyn were started. Pt is placed on tele bed for obs.    Subjective:    Voncile Schwarz today has, No headache, No chest pain, No abdominal pain - No Nausea, No new weakness tingling or numbness, No Cough - SOB.     Assessment  & Plan :     1.Abdominal wall panniculitis. Placed on empiric IV vancomycin and Zosyn, clinically better, follow blood cultures, if stable will  transition to oral antibiotics on an discharge home.  2. Acute on chronic nonspecific CHF. Improving with IV Lasix, echocardiogram ordered, no previous echo in chart. Continue beta blocker and ARB for now along with fluid and salt restriction. Monitor intake and output and daily weights.  Filed Weights   08/23/15 2025 08/24/15 0223  Weight: (!) 147.4 kg (325 lb) (!) 147.5 kg (325 lb  2.9 oz)    3.Morbid obesity. Follow with PCP post discharge for weight loss.  4. Hypokalemia and hypomagnesemia. Replaced will monitor.  5. Gout. Continue allopurinol.  6. Essential hypertension on combination of Norvasc, beta blocker, ARB and diuretic.  7. Non-ACS Prater and troponin elevation due to #2 above. On aspirin and beta blocker for secondary prevention, EKG nonspecific, she is chest pain-free, will check echocardiogram to evaluate wall motion and EF, a stable outpatient cardiac follow-up.  8. Asthma. Stable on home oxygen, continue supportive care.  9. DM type II. On Lantus and sliding scale will monitor CBGs.  CBG (last 3)   Recent Labs  08/24/15 0251 08/24/15 0826 08/24/15 1256  GLUCAP 137* 182* 180*    No results found for: HGBA1C   Family Communication  :  None present  Code Status :  Full  Diet : Heart Healthy Low Carb  Disposition Plan  :  Stay inpt  Consults  :  None  Procedures  :    TTE  DVT Prophylaxis  :  Lovenox   Lab Results  Component Value Date   PLT 120 (L) 08/23/2015    Inpatient Medications  Scheduled Meds: . allopurinol  300 mg Oral Daily  . amLODipine  10 mg Oral Daily  . aspirin  325 mg Oral Daily  . atenolol  100 mg Oral Daily  . chlorthalidone  25 mg Oral Daily  . cholecalciferol  1,000 Units Oral Daily  . enoxaparin (LOVENOX) injection  70 mg Subcutaneous Daily  . furosemide  40 mg Intravenous BID  . insulin aspart  0-9 Units Subcutaneous TID WC  . insulin glargine  30 Units Subcutaneous BID  . losartan  50 mg Oral Daily  . magnesium  sulfate 1 - 4 g bolus IVPB  1 g Intravenous Once  . montelukast  10 mg Oral QHS  . piperacillin-tazobactam (ZOSYN)  IV  3.375 g Intravenous Q8H  . potassium chloride SA  20 mEq Oral BID  . sodium chloride flush  3 mL Intravenous Q12H  . vancomycin  1,250 mg Intravenous Q12H   Continuous Infusions:  PRN Meds:.sodium chloride, acetaminophen, albuterol, cyclobenzaprine, dextromethorphan-guaiFENesin, ibuprofen, morphine injection, nitroGLYCERIN, ondansetron (ZOFRAN) IV, oxyCODONE-acetaminophen, sodium chloride flush  Antibiotics  :    Anti-infectives    Start     Dose/Rate Route Frequency Ordered Stop   08/24/15 1200  vancomycin (VANCOCIN) 1,250 mg in sodium chloride 0.9 % 250 mL IVPB  Status:  Discontinued     1,250 mg 166.7 mL/hr over 90 Minutes Intravenous Every 12 hours 08/23/15 2236 08/24/15 0750   08/24/15 0900  vancomycin (VANCOCIN) 1,250 mg in sodium chloride 0.9 % 250 mL IVPB     1,250 mg 166.7 mL/hr over 90 Minutes Intravenous Every 12 hours 08/24/15 0750     08/24/15 0600  piperacillin-tazobactam (ZOSYN) IVPB 3.375 g     3.375 g 12.5 mL/hr over 240 Minutes Intravenous Every 8 hours 08/23/15 2236     08/23/15 2245  piperacillin-tazobactam (ZOSYN) IVPB 3.375 g     3.375 g 100 mL/hr over 30 Minutes Intravenous  Once 08/23/15 2234 08/24/15 0100   08/23/15 2245  vancomycin (VANCOCIN) IVPB 1000 mg/200 mL premix     1,000 mg 200 mL/hr over 60 Minutes Intravenous Every 1 hr x 2 08/23/15 2234 08/24/15 0044         Objective:   Vitals:   08/24/15 0105 08/24/15 0223 08/24/15 0533 08/24/15 1000  BP: 135/67 129/62 (!) 133/58 114/61  Pulse:  82 81 76 74  Resp: 22 (!) 23 (!) 21 20  Temp:  98.5 F (36.9 C) 98.6 F (37 C) 98.5 F (36.9 C)  TempSrc:  Oral Oral Oral  SpO2: 97% 100% 92% 97%  Weight:  (!) 147.5 kg (325 lb 2.9 oz)    Height:  5' 3.5" (1.613 m)      Wt Readings from Last 3 Encounters:  08/24/15 (!) 147.5 kg (325 lb 2.9 oz)  07/05/13 (!) 158.8 kg (350 lb)      Intake/Output Summary (Last 24 hours) at 08/24/15 1339 Last data filed at 08/24/15 0900  Gross per 24 hour  Intake              480 ml  Output             1351 ml  Net             -871 ml     Physical Exam  Awake Alert, Oriented X 3, No new F.N deficits, Normal affect [place].AT,PERRAL Supple Neck,No JVD, No cervical lymphadenopathy appriciated.  Symmetrical Chest wall movement, Good air movement bilaterally, CTAB RRR,No Gallops,Rubs or new Murmurs, No Parasternal Heave +ve B.Sounds, Abd Soft, No tenderness, No organomegaly appriciated, No rebound - guarding or rigidity. No Cyanosis, Clubbing or edema, No new Rash or bruise, Large Pannus with cellulitis on the posterior aspect    Data Review:    CBC  Recent Labs Lab 08/23/15 2120  WBC 12.3*  HGB 11.5*  HCT 35.4*  PLT 120*  MCV 86.3  MCH 28.0  MCHC 32.5  RDW 15.8*  LYMPHSABS 1.1  MONOABS 0.7  EOSABS 0.0  BASOSABS 0.0    Chemistries   Recent Labs Lab 08/23/15 2120 08/24/15 0858  NA 136 139  K 2.7* 3.4*  CL 95* 98*  CO2 32 31  GLUCOSE 122* 229*  BUN 20 15  CREATININE 0.69 0.88  CALCIUM 8.7* 8.5*  MG 1.4* 1.6*   ------------------------------------------------------------------------------------------------------------------  Recent Labs  08/24/15 0422  CHOL 181  HDL 43  LDLCALC 118*  TRIG 100  CHOLHDL 4.2    No results found for: HGBA1C ------------------------------------------------------------------------------------------------------------------ No results for input(s): TSH, T4TOTAL, T3FREE, THYROIDAB in the last 72 hours.  Invalid input(s): FREET3 ------------------------------------------------------------------------------------------------------------------ No results for input(s): VITAMINB12, FOLATE, FERRITIN, TIBC, IRON, RETICCTPCT in the last 72 hours.  Coagulation profile  Recent Labs Lab 08/24/15 0422  INR 1.11    No results for input(s): DDIMER in the last 72  hours.  Cardiac Enzymes  Recent Labs Lab 08/23/15 2120 08/24/15 0422 08/24/15 0858  TROPONINI 0.04* 0.03* 0.03*   ------------------------------------------------------------------------------------------------------------------    Component Value Date/Time   BNP 395.9 (H) 08/23/2015 2120    Micro Results No results found for this or any previous visit (from the past 240 hour(s)).  Radiology Reports Dg Chest 2 View  Result Date: 08/23/2015 CLINICAL DATA:  Chronic shortness of breath, hypoxia EXAM: CHEST  2 VIEW COMPARISON:  10/06/2007 FINDINGS: Cardiomegaly with pulmonary vascular congestion. No frank interstitial edema. Mild right basilar scarring/ atelectasis. No pleural effusion or pneumothorax. Mild degenerative changes of the visualized thoracolumbar spine. IMPRESSION: Cardiomegaly with pulmonary vascular congestion. No frank interstitial edema. Mild right basilar scarring/ atelectasis, chronic. Electronically Signed   By: Charline BillsSriyesh  Krishnan M.D.   On: 08/23/2015 21:39   Ct Abdomen Pelvis W Contrast  Result Date: 08/23/2015 CLINICAL DATA:  Lower abdominal redness, warmth and tenderness for days. Panniculitis or deep infection. EXAM: CT ABDOMEN AND PELVIS WITH CONTRAST TECHNIQUE: Multidetector  CT imaging of the abdomen and pelvis was performed using the standard protocol following bolus administration of intravenous contrast. CONTRAST:  ISOVUE-300 IOPAMIDOL (ISOVUE-300) INJECTION 61% COMPARISON:  None. FINDINGS: Lower chest: The included lung bases are clear. Multi chamber cardiomegaly. There are coronary artery calcifications. Mild motion artifact. Liver: Enlarged measuring 24 cm cranial caudal. There is an 11 mm hypodensity in left lobe, incompletely characterized, but likely cyst. Hypodensity adjacent with falciform ligament is likely focal fatty infiltration. Hepatobiliary: Gallbladder physiologically distended. Probable intraluminal gallstone. No pericholecystic inflammation.  No biliary dilatation. Pancreas: No ductal dilatation or inflammation. Spleen: Normal. Adrenal glands: No nodule. Kidneys: Symmetric renal enhancement. No hydronephrosis. Symmetric excretion on delayed phase imaging. There is mild nonspecific perinephric edema about the lower poles bilaterally. Stomach/Bowel: Stomach is decompressed. There are no dilated or thickened small bowel loops. Small volume of stool throughout the colon without colonic wall thickening. Minimal diverticulosis of the descending colon without acute diverticulitis. The appendix is tentatively but not definitively identified. No pericecal inflammation. Vascular/Lymphatic: Prominent right external iliac node measures 14 mm short axis. Similar prominent left external iliac node. Increased number of multiple bilateral inguinal lymph nodes. No retroperitoneal adenopathy. Abdominal aorta is normal in caliber. Mild atherosclerosis of the abdominal aorta and its branches, no aneurysm. Reproductive: Post hysterectomy.  No adnexal mass. Bladder: Minimally distended without wall thickening. Other: Large lower abdominal pannus demonstrates marked skin thickening and subcutaneous reticulation. There is no focal fluid collection. No soft tissue air. No evidence of necrotizing soft tissue infection. There is air small fat containing umbilical hernia. Prominent vessels in the subcutaneous pannus. No peroneal inflammation. New free intra-abdominal air or free fluid. Musculoskeletal: There are no acute or suspicious osseous abnormalities. Multilevel degenerative change throughout the spine. There is laxity and atrophy of the anterior abdominal wall musculature. IMPRESSION: 1. Superficial panniculitis involving the inferior abdominal pannus with skin thickening and soft tissue edema. No focal fluid collection or evidence of evidence of deep soft tissue infection. Prominent inguinal and external iliac nodes are likely reactive. 2. No acute intra-abdominal/pelvic  abnormality. 3. Incidental finding of hepatomegaly. Probable cyst on the left lobe of the liver. Minimal colonic diverticulosis in the descending colon without diverticulitis. Electronically Signed   By: Rubye Oaks M.D.   On: 08/23/2015 22:40    Time Spent in minutes  30   SINGH,PRASHANT K M.D on 08/24/2015 at 1:39 PM  Between 7am to 7pm - Pager - [phone]  After 7pm go to [email or web address] - password [facility]  [facility] -  Office  [phone]

## 2015-08-24 NOTE — ED Notes (Signed)
Called 6E x2 to give report.  Nurse will call me back.

## 2015-08-24 NOTE — Care Management Note (Signed)
Case Management Note  Patient Details  Name: Morgan Fischer MRN: [id number] Date of Birth: 1950/07/22  Subjective/Objective:      CM following for progression and d/c planning.               Action/Plan: 08/24/2015 Met with pt who lives at home with husband who is able to assist. Lives in one story home and has a ramp, pt uses a cane.  Will follow for d/c needs.   Expected Discharge Date:                  Expected Discharge Plan:  [place]  In-House Referral:  NA  Discharge planning Services  CM Consult  Post Acute Care Choice:  NA Choice offered to:  Patient  DME Arranged:    DME Agency:     HH Arranged:    [place] Agency:     Status of Service:  In process, will continue to follow  If discussed at Long Length of Stay Meetings, dates discussed:    Additional Comments:  Adron Bene, RN 08/24/2015, 11:42 AM

## 2015-08-24 NOTE — Care Management Obs Status (Signed)
MEDICARE OBSERVATION STATUS NOTIFICATION   Patient Details  Name: Morgan Fischer MRN: [id number] Date of Birth: 12/06/1950   Medicare Observation Status Notification Given:  Yes    Loraine Freid, Annamarie MajorCheryl U, RN 08/24/2015, 11:39 AM

## 2015-08-25 LAB — HEMOGLOBIN A1C
HEMOGLOBIN A1C: 10.1 % — AB (ref 4.8–5.6)
Mean Plasma Glucose: 243 mg/dL

## 2015-08-25 LAB — MAGNESIUM: Magnesium: 2 mg/dL (ref 1.7–2.4)

## 2015-08-25 LAB — GLUCOSE, CAPILLARY
Glucose-Capillary: 151 mg/dL — ABNORMAL HIGH (ref 65–99)
Glucose-Capillary: 239 mg/dL — ABNORMAL HIGH (ref 65–99)
Glucose-Capillary: 157 mg/dL — ABNORMAL HIGH (ref 65–99)
Glucose-Capillary: 204 mg/dL — ABNORMAL HIGH (ref 65–99)

## 2015-08-25 LAB — BASIC METABOLIC PANEL WITH GFR
Anion gap: 9 (ref 5–15)
BUN: 18 mg/dL (ref 6–20)
CO2: 35 mmol/L — ABNORMAL HIGH (ref 22–32)
Calcium: 8.4 mg/dL — ABNORMAL LOW (ref 8.9–10.3)
Chloride: 98 mmol/L — ABNORMAL LOW (ref 101–111)
Creatinine, Ser: 0.91 mg/dL (ref 0.44–1.00)
GFR calc Af Amer: >60 mL/min
GFR calc non Af Amer: >60 mL/min
Glucose, Bld: 140 mg/dL — ABNORMAL HIGH (ref 65–99)
Potassium: 3.5 mmol/L (ref 3.5–5.1)
Sodium: 142 mmol/L (ref 135–145)

## 2015-08-25 MED ORDER — POTASSIUM CHLORIDE CRYS ER 20 MEQ PO TBCR
40.0000 meq | EXTENDED_RELEASE_TABLET | Freq: Two times a day (BID) | ORAL | Status: DC
  Administered 2015-08-25 – 2015-08-28 (×6): 40 meq via ORAL
  Filled 2015-08-25 (×6): qty 2

## 2015-08-25 NOTE — Progress Notes (Addendum)
Inpatient Diabetes Program Recommendations  AACE/ADA: New Consensus Statement on Inpatient Glycemic Control (2015)  Target Ranges:  Prepandial:   less than 140 mg/dL      Peak postprandial:   less than 180 mg/dL (1-2 hours)      Critically ill patients:  140 - 180 mg/dL   Lab Results  Component Value Date   GLUCAP 239 (H) 08/25/2015   HGBA1C 10.1 (H) 08/24/2015    Review of Glycemic Control:  Results for Morgan Fischer, Morgan (MRN [id number]) as of 08/25/2015 12:38  Ref. Range 08/24/2015 12:56 08/24/2015 16:33 08/24/2015 20:50 08/25/2015 07:32 08/25/2015 11:08  Glucose-Capillary Latest Ref Range: 65 - 99 mg/dL 409180 (H) 811209 (H) 914267 (H) 151 (H) 239 (H)   Diabetes history: Type 2 diabetes Outpatient Diabetes medications: Novolog 70/30 50 units bid (patient is not taking this insulin), Lantus 40 units q AM and 30 units q PM, Amaryl 4 mg bid, Trulicity once weekly Current orders for Inpatient glycemic control:  Novolog sensitive tid with meals and HS, Lantus 30 units bid  Inpatient Diabetes Program Recommendations:   Consider adding Novolog meal coverage 6 units tid with meals while in the hospital (hold if patient eats less than 50%).  Thanks, Beryl MeagerJenny Tryniti Laatsch, RN, BC-ADM Inpatient Diabetes Coordinator Pager [phone] (8a-5p)   1600 Addendum:  Spoke with patient.  She is currently taking Lantus instead of 70/30- Novolog due to cost and being in the donut hole.  She states that her MD was able to get her samples of Lantus but she is not sure for how long.  Gave her information regarding cheaper insulin that can be purchased from [facility] for 24.88$ per vial.  She was appreciative and plans to share information with her PCP.  She is also getting samples of Trulicity but is not sure she will be able to continue this either.   Thanks, Beryl MeagerJenny Kaevon Cotta, RN, BC-ADM

## 2015-08-25 NOTE — Progress Notes (Signed)
PROGRESS NOTE                                                                                                                                                                                                             Patient Demographics:    Morgan Fischer, is a 65 y.o. female, DOB - 10/27/1950, WUJ:[id number]  Admit date - 08/23/2015   Admitting Physician Lorretta HarpXilin Niu, MD  Outpatient Primary MD for the patient is OSEI-BONSU,GEORGE, MD  LOS - 1  Chief Complaint  Patient presents with  . Cellulitis       Brief Narrative     Morgan Fischer is a 65 y.o. female with medical history significant of hypertension, hyperlipidemia, diabetes mellitus, asthma, on home oxygen, gout, who presents with lower abdominal wall pain and redness and shortness of breath.   Patient reports that she has lower abdominal wall tenderness, redness and warmth for 3 days. She also has fever and chills. He had temperature 101 at one time at home. Patient also has worsening shortness of breath, which is worse when lying down. She has dry cough, but no chest pain. No tenderness over calf areas. Patient denies nausea, vomiting, diarrhea, abdominal pain. No symptoms of UTI.  ED Course: pt was found to have WBC 12.3, lactate 1.18, troponin 0.04, BNP 395.9, potassium 2.7, creatinine normal, temperature 98.2. CXR showedpulmonary vascular congestion. CT abdomen/pelvis showed panniculitis.EKG showed T-wave inversion in lateral leads and V4-V5. Pt was given 40 mg of IV lasix in ED. IV vanco and zosyn were started. Pt is placed on tele bed for obs.    Subjective:    Morgan Fischer today has, No headache, No chest pain, No abdominal pain - No Nausea, No new weakness tingling or numbness, No Cough - SOB.     Assessment  & Plan :     1. Abdominal wall panniculitis. Placed on empiric IV vancomycin and Zosyn, clinically better, follow blood cultures, if stable will  transition to oral antibiotics on an discharge home.  2. Acute on Chronic Diastolic CHF. Improving with IV Lasix, echocardiogram preserved EF of 55% without any wall motion abnormality and diastolic dysfunction, no previous echo in chart. Continue beta blocker and ARB for now along with fluid and salt restriction. Monitor intake and output and daily weights.  Filed Weights   08/23/15 2025 08/24/15 0223  Weight: (!) 147.4 kg (325 lb) (!) 147.5 kg (325 lb 2.9 oz)    3. Morbid obesity. Follow with PCP post discharge for weight loss.  4. Hypokalemia and hypomagnesemia. Replaced will monitor.  5. Gout. Continue allopurinol.  6. Essential hypertension -  on combination of Norvasc, beta blocker, ARB and diuretic.  7. Non-ACS Pattern and Troponin elevation due to #2 above. On aspirin and beta blocker for secondary prevention, EKG nonspecific, she is chest pain-free, will check echocardiogram to evaluate wall motion and EF, a stable outpatient cardiac follow-up.  8. Asthma. Stable on home oxygen, continue supportive care.  9. DM type II. On Lantus and sliding scale will monitor CBGs.  CBG (last 3)   Recent Labs  08/24/15 1633 08/24/15 2050 08/25/15 0732  GLUCAP 209* 267* 151*    Lab Results  Component Value Date   HGBA1C 10.1 (H) 08/24/2015     Family Communication  :  None present  Code Status :  Full  Diet : Heart Healthy Low Carb  Disposition Plan  :  Stay inpt  Consults  :  None  Procedures  :    TTE -  LVEF >70%, severely increased wall thickness, normal wall motion,  Grade 2 DD with high LV filling pressure, poorly visualized aortic valve, however there is a suggestion of at least mild aortic stenosis, heavily calcified mitral annulus, mild LAE,  trivial pericardial effusion. TEE may be helpful to further  characterize the aortic valve.  DVT Prophylaxis  :  Lovenox   Lab Results  Component Value Date   PLT 120 (L) 08/23/2015    Inpatient  Medications  Scheduled Meds: . allopurinol  300 mg Oral Daily  . amLODipine  10 mg Oral Daily  . aspirin  325 mg Oral Daily  . atenolol  100 mg Oral Daily  . chlorthalidone  25 mg Oral Daily  . cholecalciferol  1,000 Units Oral Daily  . enoxaparin (LOVENOX) injection  70 mg Subcutaneous Daily  . furosemide  40 mg Intravenous BID  . insulin aspart  0-9 Units Subcutaneous TID WC  . insulin glargine  30 Units Subcutaneous BID  . losartan  50 mg Oral Daily  . magnesium sulfate 1 - 4 g bolus IVPB  1 g Intravenous Once  . montelukast  10 mg Oral QHS  . piperacillin-tazobactam (ZOSYN)  IV  3.375 g Intravenous Q8H  . potassium chloride SA  40 mEq Oral BID  . sodium chloride flush  3 mL Intravenous Q12H  . vancomycin  1,250 mg Intravenous Q12H   Continuous Infusions:  PRN Meds:.sodium chloride, acetaminophen, albuterol, cyclobenzaprine, dextromethorphan-guaiFENesin, ibuprofen, morphine injection, nitroGLYCERIN, ondansetron (ZOFRAN) IV, oxyCODONE-acetaminophen, sodium chloride flush  Antibiotics  :    Anti-infectives    Start     Dose/Rate Route Frequency Ordered Stop   08/24/15 1200  vancomycin (VANCOCIN) 1,250 mg in sodium chloride 0.9 % 250 mL IVPB  Status:  Discontinued     1,250 mg 166.7 mL/hr over 90 Minutes Intravenous Every 12 hours 08/23/15 2236 08/24/15 0750   08/24/15 0900  vancomycin (VANCOCIN) 1,250 mg in sodium chloride 0.9 % 250 mL IVPB     1,250 mg 166.7 mL/hr over 90 Minutes Intravenous Every 12 hours 08/24/15 0750     08/24/15 0600  piperacillin-tazobactam (ZOSYN) IVPB 3.375 g     3.375 g 12.5 mL/hr over 240 Minutes Intravenous Every 8 hours 08/23/15 2236     08/23/15 2245  piperacillin-tazobactam (ZOSYN) IVPB 3.375 g  3.375 g 100 mL/hr over 30 Minutes Intravenous  Once 08/23/15 2234 08/24/15 0100   08/23/15 2245  vancomycin (VANCOCIN) IVPB 1000 mg/200 mL premix     1,000 mg 200 mL/hr over 60 Minutes Intravenous Every 1 hr x 2 08/23/15 2234 08/24/15 0044          Objective:   Vitals:   08/24/15 2051 08/24/15 2108 08/25/15 0606 08/25/15 0925  BP: (!) 114/52 (!) 145/49 (!) 109/54 (!) 121/49  Pulse: 66 89 65 (!) 54  Resp: 20 (!) 21 (!) 22 (!) 22  Temp: 98.2 F (36.8 C) 97.3 F (36.3 C) 98.4 F (36.9 C) 98.4 F (36.9 C)  TempSrc:    Oral  SpO2: 92% 96% 98% 98%  Weight:      Height:        Wt Readings from Last 3 Encounters:  08/24/15 (!) 147.5 kg (325 lb 2.9 oz)  07/05/13 (!) 158.8 kg (350 lb)     Intake/Output Summary (Last 24 hours) at 08/25/15 1055 Last data filed at 08/25/15 0900  Gross per 24 hour  Intake             1070 ml  Output                0 ml  Net             1070 ml     Physical Exam  Awake Alert, Oriented X 3, No new F.N deficits, Normal affect .AT,PERRAL Supple Neck,No JVD, No cervical lymphadenopathy appriciated.  Symmetrical Chest wall movement, Good air movement bilaterally, CTAB RRR,No Gallops,Rubs or new Murmurs, No Parasternal Heave +ve B.Sounds, Abd Soft, No tenderness, No organomegaly appriciated, No rebound - guarding or rigidity. No Cyanosis, Clubbing or edema, No new Rash or bruise, Large Pannus with cellulitis on the posterior aspect    Data Review:    CBC  Recent Labs Lab 08/23/15 2120  WBC 12.3*  HGB 11.5*  HCT 35.4*  PLT 120*  MCV 86.3  MCH 28.0  MCHC 32.5  RDW 15.8*  LYMPHSABS 1.1  MONOABS 0.7  EOSABS 0.0  BASOSABS 0.0    Chemistries   Recent Labs Lab 08/23/15 2120 08/24/15 0858 08/25/15 0431  NA 136 139 142  K 2.7* 3.4* 3.5  CL 95* 98* 98*  CO2 32 31 35*  GLUCOSE 122* 229* 140*  BUN 20 15 18   CREATININE 0.69 0.88 0.91  CALCIUM 8.7* 8.5* 8.4*  MG 1.4* 1.6* 2.0   ------------------------------------------------------------------------------------------------------------------  Recent Labs  08/24/15 0422  CHOL 181  HDL 43  LDLCALC 118*  TRIG 100  CHOLHDL 4.2    Lab Results  Component Value Date   HGBA1C 10.1 (H) 08/24/2015    ------------------------------------------------------------------------------------------------------------------ No results for input(s): TSH, T4TOTAL, T3FREE, THYROIDAB in the last 72 hours.  Invalid input(s): FREET3 ------------------------------------------------------------------------------------------------------------------ No results for input(s): VITAMINB12, FOLATE, FERRITIN, TIBC, IRON, RETICCTPCT in the last 72 hours.  Coagulation profile  Recent Labs Lab 08/24/15 0422  INR 1.11    No results for input(s): DDIMER in the last 72 hours.  Cardiac Enzymes  Recent Labs Lab 08/24/15 0422 08/24/15 0858 08/24/15 1508  TROPONINI 0.03* 0.03* <0.03   ------------------------------------------------------------------------------------------------------------------    Component Value Date/Time   BNP 395.9 (H) 08/23/2015 2120    Micro Results No results found for this or any previous visit (from the past 240 hour(s)).  Radiology Reports Dg Chest 2 View  Result Date: 08/23/2015 CLINICAL DATA:  Chronic shortness of breath, hypoxia EXAM: CHEST  2 VIEW COMPARISON:  10/06/2007 FINDINGS: Cardiomegaly with pulmonary vascular congestion. No frank interstitial edema. Mild right basilar scarring/ atelectasis. No pleural effusion or pneumothorax. Mild degenerative changes of the visualized thoracolumbar spine. IMPRESSION: Cardiomegaly with pulmonary vascular congestion. No frank interstitial edema. Mild right basilar scarring/ atelectasis, chronic. Electronically Signed   By: Charline Bills M.D.   On: 08/23/2015 21:39   Ct Abdomen Pelvis W Contrast  Result Date: 08/23/2015 CLINICAL DATA:  Lower abdominal redness, warmth and tenderness for days. Panniculitis or deep infection. EXAM: CT ABDOMEN AND PELVIS WITH CONTRAST TECHNIQUE: Multidetector CT imaging of the abdomen and pelvis was performed using the standard protocol following bolus administration of intravenous contrast.  CONTRAST:  ISOVUE-300 IOPAMIDOL (ISOVUE-300) INJECTION 61% COMPARISON:  None. FINDINGS: Lower chest: The included lung bases are clear. Multi chamber cardiomegaly. There are coronary artery calcifications. Mild motion artifact. Liver: Enlarged measuring 24 cm cranial caudal. There is an 11 mm hypodensity in left lobe, incompletely characterized, but likely cyst. Hypodensity adjacent with falciform ligament is likely focal fatty infiltration. Hepatobiliary: Gallbladder physiologically distended. Probable intraluminal gallstone. No pericholecystic inflammation. No biliary dilatation. Pancreas: No ductal dilatation or inflammation. Spleen: Normal. Adrenal glands: No nodule. Kidneys: Symmetric renal enhancement. No hydronephrosis. Symmetric excretion on delayed phase imaging. There is mild nonspecific perinephric edema about the lower poles bilaterally. Stomach/Bowel: Stomach is decompressed. There are no dilated or thickened small bowel loops. Small volume of stool throughout the colon without colonic wall thickening. Minimal diverticulosis of the descending colon without acute diverticulitis. The appendix is tentatively but not definitively identified. No pericecal inflammation. Vascular/Lymphatic: Prominent right external iliac node measures 14 mm short axis. Similar prominent left external iliac node. Increased number of multiple bilateral inguinal lymph nodes. No retroperitoneal adenopathy. Abdominal aorta is normal in caliber. Mild atherosclerosis of the abdominal aorta and its branches, no aneurysm. Reproductive: Post hysterectomy.  No adnexal mass. Bladder: Minimally distended without wall thickening. Other: Large lower abdominal pannus demonstrates marked skin thickening and subcutaneous reticulation. There is no focal fluid collection. No soft tissue air. No evidence of necrotizing soft tissue infection. There is air small fat containing umbilical hernia. Prominent vessels in the subcutaneous pannus. No  peroneal inflammation. New free intra-abdominal air or free fluid. Musculoskeletal: There are no acute or suspicious osseous abnormalities. Multilevel degenerative change throughout the spine. There is laxity and atrophy of the anterior abdominal wall musculature. IMPRESSION: 1. Superficial panniculitis involving the inferior abdominal pannus with skin thickening and soft tissue edema. No focal fluid collection or evidence of evidence of deep soft tissue infection. Prominent inguinal and external iliac nodes are likely reactive. 2. No acute intra-abdominal/pelvic abnormality. 3. Incidental finding of hepatomegaly. Probable cyst on the left lobe of the liver. Minimal colonic diverticulosis in the descending colon without diverticulitis. Electronically Signed   By: Rubye Oaks M.D.   On: 08/23/2015 22:40    Time Spent in minutes  30   Keltie Labell K M.D on 08/25/2015 at 10:55 AM  Between 7am to 7pm - Pager - [phone]  After 7pm go to [email or web address] - password [facility]  [facility] -  Office  [phone]

## 2015-08-25 NOTE — Progress Notes (Signed)
Occupational Therapy Evaluation Patient Details Name: Morgan Fischer MRN: [id number] DOB: 04/09/1950 Today's Date: 08/25/2015    History of Present Illness 65 yo female with onset of abdominal edema and erythema diagnosed as panniculitis, had elevated troponins and liver enlargement, diverticulosis.  PHx:  O2 2L at baseline, HTN, gout,    Clinical Impression   No further OT needs at this time. Patient at or very close to baseline for ADLs.      Follow Up Recommendations  No OT follow up;Supervision/Assistance - 24 hour    Equipment Recommendations  None recommended by OT    Recommendations for Other Services       Precautions / Restrictions Precautions Precautions: Fall Precaution Comments: using cane at baseline Restrictions Weight Bearing Restrictions: No      Mobility Bed Mobility               General bed mobility comments: sitting EOB upon arrival  Transfers Overall transfer level: Needs assistance Equipment used: Straight cane Transfers: Sit to/from Stand Sit to Stand: Min guard;Supervision              Balance                                            ADL Overall ADL's : At baseline                                       General ADL Comments: Patient reports she feels at baseline for ADLs. She occasionally needs assistance with LB self-care by husband.     Vision     Perception     Praxis      Pertinent Vitals/Pain Pain Assessment: No/denies pain     Hand Dominance     Extremity/Trunk Assessment Upper Extremity Assessment Upper Extremity Assessment: Overall WFL for tasks assessed   Lower Extremity Assessment Lower Extremity Assessment: Defer to PT evaluation   Cervical / Trunk Assessment Cervical / Trunk Assessment: Normal   Communication Communication Communication: No difficulties   Cognition Arousal/Alertness: Awake/alert Behavior During Therapy: WFL for tasks  assessed/performed Overall Cognitive Status: Within Functional Limits for tasks assessed                     General Comments       Exercises       Shoulder Instructions      Home Living Family/patient expects to be discharged to:: Private residence Living Arrangements: Spouse/significant other Available Help at Discharge: Family;Available 24 hours/day Type of Home: House Home Access: Ramped entrance     Home Layout: One level     Bathroom Shower/Tub: Producer, television/film/videoWalk-in shower   Bathroom Toilet: Standard Bathroom Accessibility: Yes   Home Equipment: Cane - single point;Shower seat   Additional Comments: says her toilet is higher but not handicapped height      Prior Functioning/Environment Level of Independence: Independent with assistive device(s)             OT Diagnosis: Generalized weakness   OT Problem List: Decreased strength;Decreased activity tolerance;Impaired balance (sitting and/or standing)   OT Treatment/Interventions:      OT Goals(Current goals can be found in the care plan section) Acute Rehab OT Goals Patient Stated Goal: to walk and go home OT Goal Formulation: All assessment and  education complete, DC therapy  OT Frequency:     Barriers to D/C:            Co-evaluation              End of Session Equipment Utilized During Treatment: Oxygen;Other (comment) [facility])  Activity Tolerance: Patient tolerated treatment well Patient left: in bed;with call bell/phone within reach;with family/visitor present   Time: 1101-1111 OT Time Calculation (min): 10 min Charges:  OT General Charges $OT Visit: 1 Procedure OT Evaluation $OT Eval Low Complexity: 1 Procedure G-Codes:    Viann Nielson A 08/25/2015, 12:35 PM

## 2015-08-26 ENCOUNTER — Encounter (HOSPITAL_COMMUNITY): Payer: Self-pay | Admitting: *Deleted

## 2015-08-26 LAB — GLUCOSE, CAPILLARY
Glucose-Capillary: 123 mg/dL — ABNORMAL HIGH (ref 65–99)
Glucose-Capillary: 175 mg/dL — ABNORMAL HIGH (ref 65–99)
Glucose-Capillary: 177 mg/dL — ABNORMAL HIGH (ref 65–99)
Glucose-Capillary: 162 mg/dL — ABNORMAL HIGH (ref 65–99)

## 2015-08-26 LAB — BASIC METABOLIC PANEL
Anion gap: 7 (ref 5–15)
BUN: 19 mg/dL (ref 6–20)
CHLORIDE: 100 mmol/L — AB (ref 101–111)
CO2: 35 mmol/L — ABNORMAL HIGH (ref 22–32)
CREATININE: 0.78 mg/dL (ref 0.44–1.00)
Calcium: 8.2 mg/dL — ABNORMAL LOW (ref 8.9–10.3)
Glucose, Bld: 130 mg/dL — ABNORMAL HIGH (ref 65–99)
POTASSIUM: 3.7 mmol/L (ref 3.5–5.1)
SODIUM: 142 mmol/L (ref 135–145)

## 2015-08-26 MED ORDER — SODIUM CHLORIDE 0.9% FLUSH
10.0000 mL | INTRAVENOUS | Status: DC | PRN
Start: 2015-08-26 — End: 2015-08-28

## 2015-08-26 NOTE — Progress Notes (Signed)
Physical Therapy Treatment Patient Details Name: Morgan Fischer MRN: [id number] DOB: 1950/02/04 Today's Date: 08/26/2015    History of Present Illness 65 yo female with onset of abdominal edema and erythema diagnosed as panniculitis, had elevated troponins and liver enlargement, diverticulosis.  PHx:  O2 2L at baseline, HTN, gout,     PT Comments    Pt is up to walk with PT and completed there exercises.  Her trip to [facility] when PT arrived was with extended O2 line and with some use of contact on the surfaces with her free hand.  Pt still will benefit from RW but has declined for now to switch devices, although she does have one.  Continue acutely for LE strengthening and safety with mobility.  Follow Up Recommendations  Home health PT;Supervision - Intermittent     Equipment Recommendations  Cane (bariatric like the [facility] cane in her room)    Recommendations for Other Services Rehab consult     Precautions / Restrictions Precautions Precautions: Fall Precaution Comments: using cane at baseline Restrictions Weight Bearing Restrictions: No    Mobility  Bed Mobility               General bed mobility comments: sitting EOB when PT arrived  Transfers Overall transfer level: Needs assistance Equipment used: Straight cane Transfers: Sit to/from [employer] Transfers Sit to Stand: Supervision Stand pivot transfers: Supervision          Ambulation/Gait Ambulation/Gait assistance: Supervision;Min guard Ambulation Distance (Feet): 120 Feet Assistive device: 1 person hand held assist;Straight cane Gait Pattern/deviations: Step-through pattern;Step-to pattern;Wide base of support;Trunk flexed;Decreased stride length Gait velocity: reduced Gait velocity interpretation: Below normal speed for age/gender     Stairs            Wheelchair Mobility    Modified Rankin (Stroke Patients Only)       Balance Overall balance assessment: Needs  assistance Sitting-balance support: Feet supported Sitting balance-Leahy Scale: Good     Standing balance support: Single extremity supported Standing balance-Leahy Scale: Fair                      Cognition Arousal/Alertness: Awake/alert Behavior During Therapy: WFL for tasks assessed/performed Overall Cognitive Status: Within Functional Limits for tasks assessed                      Exercises General Exercises - Lower Extremity Ankle Circles/Pumps: AROM;Both;10 reps Long Arc Quad: AROM;Both;10 reps Heel Slides: AROM;Both;10 reps Hip ABduction/ADduction: AROM;Both;10 reps Hip Flexion/Marching: AROM;Both;10 reps (pt reports panniculus is impeding somewhat)    General Comments General comments (skin integrity, edema, etc.): Pt is walking with bariatric SPC and is asking to get this type for home, as she is using a wooden cane from an elderly family member.        Pertinent Vitals/Pain Pain Assessment: No/denies pain    Home Living                      Prior Function            PT Goals (current goals can now be found in the care plan section) Acute Rehab PT Goals Patient Stated Goal: to walk and go home Progress towards PT goals: Progressing toward goals    Frequency  Min 3X/week    PT Plan      Co-evaluation             End of Session Equipment Utilized During  Treatment: Oxygen (able to maintain 93% with 2L O2) Activity Tolerance: Patient tolerated treatment well Patient left: in chair;with call bell/phone within reach     Time: 1123-1209 PT Time Calculation (min) (ACUTE ONLY): 46 min  Charges:  $Gait Training: 8-22 mins $Therapeutic Exercise: 8-22 mins $Therapeutic Activity: 8-22 mins                    G Codes:      Ivar DrapeStout, Shalea Tomczak E 08/26/2015, 1:46 PM    Samul Dadauth Madgeline Rayo, PT MS Acute Rehab Dept. Number: [facility] [phone] and [facility] [phone]

## 2015-08-26 NOTE — Progress Notes (Signed)
Pharmacy Antibiotic Note Morgan Fischer is a 65 y.o. female admitted on 08/23/2015 with panniculitis. Currently on day 3 of Zosyn and vancomycin.  Plan: 1. Continue EI Zosyn and vancomycin at current doses 2. If remains on IV abx will plan to obtain vancomycin trough in next 24 - 48 hours   Height: 5' 3.5" (161.3 cm) Weight: (!) 325 lb 2.9 oz (147.5 kg) IBW/kg (Calculated) : 53.55  Temp (24hrs), Avg:98.3 F (36.8 C), Min:98 F (36.7 C), Max:98.5 F (36.9 C)   Recent Labs Lab 08/23/15 2120 08/23/15 2204 08/24/15 0858 08/25/15 0431 08/26/15 0737  WBC 12.3*  --   --   --   --   CREATININE 0.69  --  0.88 0.91 0.78  LATICACIDVEN  --  1.18  --   --   --     Estimated Creatinine Clearance: 99.6 mL/min (by C-G formula based on SCr of 0.8 mg/dL).    Allergies  Allergen Reactions  . Ciprocinonide [Fluocinolone] Other (See Comments)    Kidney failure  . Catapres [Clonidine Hcl] Rash  . Demadex [Torsemide] Rash    Antimicrobials this admission: Vanc 8/21>> Zosyn 8/21>>  Dose adjustments this admission: n/a  Microbiology results: 8/21 BCx: ngtd   Thank you for allowing pharmacy to be a part of this patient's care.  Pollyann SamplesAndy Antonique Langford, PharmD, BCPS 08/26/2015, 9:08 AM Pager: [phone]

## 2015-08-26 NOTE — Progress Notes (Signed)
PROGRESS NOTE                                                                                                                                                                                                             Patient Demographics:    Morgan Fischer, is a 65 y.o. female, DOB - 1950/10/20, ZOX:[id number]  Admit date - 08/23/2015   Admitting Physician Lorretta Harp, MD  Outpatient Primary MD for the patient is OSEI-BONSU,GEORGE, MD  LOS - 2  Chief Complaint  Patient presents with  . Cellulitis       Brief Narrative     Morgan Fischer is a 65 y.o. female with medical history significant of hypertension, hyperlipidemia, diabetes mellitus, asthma, on home oxygen, gout, who presents with lower abdominal wall pain and redness and shortness of breath.   Patient reports that she has lower abdominal wall tenderness, redness and warmth for 3 days. She also has fever and chills. He had temperature 101 at one time at home. Patient also has worsening shortness of breath, which is worse when lying down. She has dry cough, but no chest pain. No tenderness over calf areas. Patient denies nausea, vomiting, diarrhea, abdominal pain. No symptoms of UTI.  ED Course: pt was found to have WBC 12.3, lactate 1.18, troponin 0.04, BNP 395.9, potassium 2.7, creatinine normal, temperature 98.2. CXR showedpulmonary vascular congestion. CT abdomen/pelvis showed panniculitis.EKG showed T-wave inversion in lateral leads and V4-V5. Pt was given 40 mg of IV lasix in ED. IV vanco and zosyn were started. Pt is placed on tele bed for obs.    Subjective:    [name] today has, No headache, No chest pain, No abdominal pain - No Nausea, No new weakness tingling or numbness, No Cough - SOB.     Assessment  & Plan :     1. Abdominal wall panniculitis. Placed on empiric IV vancomycin and Zosyn, clinically better, follow blood cultures, if stable will  transition to oral antibiotics on an discharge home on 08-27-15.  2. Acute on Chronic Diastolic CHF. Improving with IV Lasix, echocardiogram preserved EF of 55% without any wall motion abnormality and diastolic dysfunction, no previous echo in chart. Continue beta blocker and ARB for now along with fluid and salt restriction. Monitor intake and output and daily weights.  Filed Weights   08/23/15 2025  08/24/15 0223  Weight: (!) 147.4 kg (325 lb) (!) 147.5 kg (325 lb 2.9 oz)    3. Morbid obesity. Follow with PCP post discharge for weight loss.  4. Hypokalemia and hypomagnesemia. Replaced will monitor.  5. Gout. Continue allopurinol.  6. Essential hypertension -  on combination of Norvasc, beta blocker, ARB and diuretic.  7. Non-ACS Pattern and Troponin elevation due to #2 above. On aspirin and beta blocker for secondary prevention, EKG nonspecific, she is chest pain-free, will check echocardiogram to evaluate wall motion and EF, a stable outpatient cardiac follow-up.  8. Asthma. Stable on home oxygen, continue supportive care.  9. DM type II. On Lantus and sliding scale will monitor CBGs.  CBG (last 3)   Recent Labs  08/25/15 1713 08/25/15 2050 08/26/15 0716  GLUCAP 157* 204* 123*    Lab Results  Component Value Date   HGBA1C 10.1 (H) 08/24/2015     Family Communication  :  None present  Code Status :  Full  Diet : Heart Healthy Low Carb  Disposition Plan  :  Stay inpt  Consults  :  None  Procedures  :    TTE -  LVEF >70%, severely increased wall thickness, normal wall motion,  Grade 2 DD with high LV filling pressure, poorly visualized aortic valve, however there is a suggestion of at least mild aortic stenosis, heavily calcified mitral annulus, mild LAE,  trivial pericardial effusion. TEE may be helpful to further  characterize the aortic valve.  DVT Prophylaxis  :  Lovenox   Lab Results  Component Value Date   PLT 120 (L) 08/23/2015    Inpatient  Medications  Scheduled Meds: . allopurinol  300 mg Oral Daily  . amLODipine  10 mg Oral Daily  . aspirin  325 mg Oral Daily  . atenolol  100 mg Oral Daily  . chlorthalidone  25 mg Oral Daily  . cholecalciferol  1,000 Units Oral Daily  . enoxaparin (LOVENOX) injection  70 mg Subcutaneous Daily  . furosemide  40 mg Intravenous BID  . insulin aspart  0-9 Units Subcutaneous TID WC  . insulin glargine  30 Units Subcutaneous BID  . losartan  50 mg Oral Daily  . montelukast  10 mg Oral QHS  . piperacillin-tazobactam (ZOSYN)  IV  3.375 g Intravenous Q8H  . potassium chloride SA  40 mEq Oral BID  . sodium chloride flush  3 mL Intravenous Q12H  . vancomycin  1,250 mg Intravenous Q12H   Continuous Infusions:  PRN Meds:.sodium chloride, acetaminophen, albuterol, cyclobenzaprine, dextromethorphan-guaiFENesin, ibuprofen, morphine injection, nitroGLYCERIN, ondansetron (ZOFRAN) IV, oxyCODONE-acetaminophen, sodium chloride flush  Antibiotics  :    Anti-infectives    Start     Dose/Rate Route Frequency Ordered Stop   08/24/15 1200  vancomycin (VANCOCIN) 1,250 mg in sodium chloride 0.9 % 250 mL IVPB  Status:  Discontinued     1,250 mg 166.7 mL/hr over 90 Minutes Intravenous Every 12 hours 08/23/15 2236 08/24/15 0750   08/24/15 0900  vancomycin (VANCOCIN) 1,250 mg in sodium chloride 0.9 % 250 mL IVPB     1,250 mg 166.7 mL/hr over 90 Minutes Intravenous Every 12 hours 08/24/15 0750     08/24/15 0600  piperacillin-tazobactam (ZOSYN) IVPB 3.375 g     3.375 g 12.5 mL/hr over 240 Minutes Intravenous Every 8 hours 08/23/15 2236     08/23/15 2245  piperacillin-tazobactam (ZOSYN) IVPB 3.375 g     3.375 g 100 mL/hr over 30 Minutes Intravenous  Once 08/23/15  2234 08/24/15 0100   08/23/15 2245  vancomycin (VANCOCIN) IVPB 1000 mg/200 mL premix     1,000 mg 200 mL/hr over 60 Minutes Intravenous Every 1 hr x 2 08/23/15 2234 08/24/15 0044         Objective:   Vitals:   08/25/15 1816 08/25/15 2051  08/26/15 0540 08/26/15 0927  BP: (!) 122/55 (!) 136/49 (!) 126/51 (!) 117/52  Pulse: 62 60 63 68  Resp: (!) 22 20 18 20   Temp: 98 F (36.7 C) 98.5 F (36.9 C) 98.3 F (36.8 C) 97.9 F (36.6 C)  TempSrc: Oral  Oral Oral  SpO2: 98% 100% 100% 98%  Weight:      Height:        Wt Readings from Last 3 Encounters:  08/24/15 (!) 147.5 kg (325 lb 2.9 oz)  07/05/13 (!) 158.8 kg (350 lb)     Intake/Output Summary (Last 24 hours) at 08/26/15 0947 Last data filed at 08/26/15 0024  Gross per 24 hour  Intake              900 ml  Output                0 ml  Net              900 ml     Physical Exam  Awake Alert, Oriented X 3, No new F.N deficits, Normal affect [place].AT,PERRAL Supple Neck,No JVD, No cervical lymphadenopathy appriciated.  Symmetrical Chest wall movement, Good air movement bilaterally, CTAB RRR,No Gallops,Rubs or new Murmurs, No Parasternal Heave +ve B.Sounds, Abd Soft, No tenderness, No organomegaly appriciated, No rebound - guarding or rigidity. No Cyanosis, Clubbing or edema, No new Rash or bruise, Large Pannus with cellulitis on the posterior aspect    Data Review:    CBC  Recent Labs Lab 08/23/15 2120  WBC 12.3*  HGB 11.5*  HCT 35.4*  PLT 120*  MCV 86.3  MCH 28.0  MCHC 32.5  RDW 15.8*  LYMPHSABS 1.1  MONOABS 0.7  EOSABS 0.0  BASOSABS 0.0    Chemistries   Recent Labs Lab 08/23/15 2120 08/24/15 0858 08/25/15 0431 08/26/15 0737  NA 136 139 142 142  K 2.7* 3.4* 3.5 3.7  CL 95* 98* 98* 100*  CO2 32 31 35* 35*  GLUCOSE 122* 229* 140* 130*  BUN 20 15 18 19   CREATININE 0.69 0.88 0.91 0.78  CALCIUM 8.7* 8.5* 8.4* 8.2*  MG 1.4* 1.6* 2.0  --    ------------------------------------------------------------------------------------------------------------------  Recent Labs  08/24/15 0422  CHOL 181  HDL 43  LDLCALC 118*  TRIG 100  CHOLHDL 4.2    Lab Results  Component Value Date   HGBA1C 10.1 (H) 08/24/2015    ------------------------------------------------------------------------------------------------------------------ No results for input(s): TSH, T4TOTAL, T3FREE, THYROIDAB in the last 72 hours.  Invalid input(s): FREET3 ------------------------------------------------------------------------------------------------------------------ No results for input(s): VITAMINB12, FOLATE, FERRITIN, TIBC, IRON, RETICCTPCT in the last 72 hours.  Coagulation profile  Recent Labs Lab 08/24/15 0422  INR 1.11    No results for input(s): DDIMER in the last 72 hours.  Cardiac Enzymes  Recent Labs Lab 08/24/15 0422 08/24/15 0858 08/24/15 1508  TROPONINI 0.03* 0.03* <0.03   ------------------------------------------------------------------------------------------------------------------    Component Value Date/Time   BNP 395.9 (H) 08/23/2015 2120    Micro Results Recent Results (from the past 240 hour(s))  Culture, blood (routine x 2)     Status: None (Preliminary result)   Collection Time: 08/24/15  4:01 AM  Result Value Ref Range  Status   Specimen Description BLOOD RIGHT HAND  Final   Special Requests BOTTLES DRAWN AEROBIC ONLY 5CC  Final   Culture NO GROWTH 1 DAY  Final   Report Status PENDING  Incomplete  Culture, blood (routine x 2)     Status: None (Preliminary result)   Collection Time: 08/24/15  4:20 AM  Result Value Ref Range Status   Specimen Description BLOOD RIGHT WRIST  Final   Special Requests IN PEDIATRIC BOTTLE 1CC  Final   Culture NO GROWTH 1 DAY  Final   Report Status PENDING  Incomplete    Radiology Reports Dg Chest 2 View  Result Date: 08/23/2015 CLINICAL DATA:  Chronic shortness of breath, hypoxia EXAM: CHEST  2 VIEW COMPARISON:  10/06/2007 FINDINGS: Cardiomegaly with pulmonary vascular congestion. No frank interstitial edema. Mild right basilar scarring/ atelectasis. No pleural effusion or pneumothorax. Mild degenerative changes of the visualized thoracolumbar  spine. IMPRESSION: Cardiomegaly with pulmonary vascular congestion. No frank interstitial edema. Mild right basilar scarring/ atelectasis, chronic. Electronically Signed   By: Charline Bills M.D.   On: 08/23/2015 21:39   Ct Abdomen Pelvis W Contrast  Result Date: 08/23/2015 CLINICAL DATA:  Lower abdominal redness, warmth and tenderness for days. Panniculitis or deep infection. EXAM: CT ABDOMEN AND PELVIS WITH CONTRAST TECHNIQUE: Multidetector CT imaging of the abdomen and pelvis was performed using the standard protocol following bolus administration of intravenous contrast. CONTRAST:  ISOVUE-300 IOPAMIDOL (ISOVUE-300) INJECTION 61% COMPARISON:  None. FINDINGS: Lower chest: The included lung bases are clear. Multi chamber cardiomegaly. There are coronary artery calcifications. Mild motion artifact. Liver: Enlarged measuring 24 cm cranial caudal. There is an 11 mm hypodensity in left lobe, incompletely characterized, but likely cyst. Hypodensity adjacent with falciform ligament is likely focal fatty infiltration. Hepatobiliary: Gallbladder physiologically distended. Probable intraluminal gallstone. No pericholecystic inflammation. No biliary dilatation. Pancreas: No ductal dilatation or inflammation. Spleen: Normal. Adrenal glands: No nodule. Kidneys: Symmetric renal enhancement. No hydronephrosis. Symmetric excretion on delayed phase imaging. There is mild nonspecific perinephric edema about the lower poles bilaterally. Stomach/Bowel: Stomach is decompressed. There are no dilated or thickened small bowel loops. Small volume of stool throughout the colon without colonic wall thickening. Minimal diverticulosis of the descending colon without acute diverticulitis. The appendix is tentatively but not definitively identified. No pericecal inflammation. Vascular/Lymphatic: Prominent right external iliac node measures 14 mm short axis. Similar prominent left external iliac node. Increased number of multiple  bilateral inguinal lymph nodes. No retroperitoneal adenopathy. Abdominal aorta is normal in caliber. Mild atherosclerosis of the abdominal aorta and its branches, no aneurysm. Reproductive: Post hysterectomy.  No adnexal mass. Bladder: Minimally distended without wall thickening. Other: Large lower abdominal pannus demonstrates marked skin thickening and subcutaneous reticulation. There is no focal fluid collection. No soft tissue air. No evidence of necrotizing soft tissue infection. There is air small fat containing umbilical hernia. Prominent vessels in the subcutaneous pannus. No peroneal inflammation. New free intra-abdominal air or free fluid. Musculoskeletal: There are no acute or suspicious osseous abnormalities. Multilevel degenerative change throughout the spine. There is laxity and atrophy of the anterior abdominal wall musculature. IMPRESSION: 1. Superficial panniculitis involving the inferior abdominal pannus with skin thickening and soft tissue edema. No focal fluid collection or evidence of evidence of deep soft tissue infection. Prominent inguinal and external iliac nodes are likely reactive. 2. No acute intra-abdominal/pelvic abnormality. 3. Incidental finding of hepatomegaly. Probable cyst on the left lobe of the liver. Minimal colonic diverticulosis in the descending colon without diverticulitis.  Electronically Signed   By: Rubye Oaks M.D.   On: 08/23/2015 22:40    Time Spent in minutes  30   Subrena Devereux K M.D on 08/26/2015 at 9:47 AM  Between 7am to 7pm - Pager - [phone]  After 7pm go to [email or web address] - password [facility]  [facility] -  Office  [phone]

## 2015-08-27 LAB — GLUCOSE, CAPILLARY
GLUCOSE-CAPILLARY: 178 mg/dL — AB (ref 65–99)
GLUCOSE-CAPILLARY: 149 mg/dL — AB (ref 65–99)
Glucose-Capillary: 87 mg/dL (ref 65–99)
Glucose-Capillary: 180 mg/dL — ABNORMAL HIGH (ref 65–99)

## 2015-08-27 MED ORDER — AMOXICILLIN-POT CLAVULANATE 875-125 MG PO TABS
1.0000 | ORAL_TABLET | Freq: Two times a day (BID) | ORAL | Status: DC
  Administered 2015-08-28: 1 via ORAL
  Filled 2015-08-27: qty 1

## 2015-08-27 MED ORDER — SULFAMETHOXAZOLE-TRIMETHOPRIM 800-160 MG PO TABS
1.0000 | ORAL_TABLET | Freq: Two times a day (BID) | ORAL | Status: DC
  Administered 2015-08-28: 1 via ORAL
  Filled 2015-08-27: qty 1

## 2015-08-27 NOTE — Progress Notes (Signed)
Physical Therapy Treatment Patient Details Name: Morgan Fischer MRN: [id number] DOB: 09/27/1950 Today's Date: 08/27/2015    History of Present Illness 65 yo female with onset of abdominal edema and erythema diagnosed as panniculitis, had elevated troponins and liver enlargement, diverticulosis.  PHx:  O2 2L at baseline, HTN, gout,     PT Comments    Pt reports difficulty with community ambulation, she stated she gets SOB walking into a store and is then too tired to walk further to obtain needed items. Strongly encouraged pt to consider use of a rollator for energy conservation and to reduce fall risk,  she is agreeable.    Follow Up Recommendations  Home health PT;Supervision - Intermittent     Equipment Recommendations  Other (comment) (wide rollator (4 wheeled rolling walker))    Recommendations for Other Services       Precautions / Restrictions Precautions Precautions: Fall Precaution Comments: using cane at baseline, no h/o falls but pt reports knees feel like they'll "give out", on 2L O2 at baseline Restrictions Weight Bearing Restrictions: No    Mobility  Bed Mobility               General bed mobility comments: sitting EOB when PT arrived  Transfers Overall transfer level: Modified independent Equipment used: Straight cane Transfers: Sit to/from Stand Sit to Stand: Modified independent (Device/Increase time)         General transfer comment: good hand placement  Ambulation/Gait Ambulation/Gait assistance: Min guard Ambulation Distance (Feet): 150 Feet Assistive device: Straight cane;1 person hand held assist Gait Pattern/deviations: Step-through pattern;Wide base of support;Decreased stride length Gait velocity: reduced   General Gait Details: pt used 3L O2 [place] with walking (which is baseline), one standing rest break due to SOB, SaO2 97%, distance limited by SOB and B knees feeling like they'll "give out" which is baseline, no LOB, pt relied on cane  and hand held assist so PT recommending use of rollator for increased support and for energy conservation, pt agreeable   Stairs            Wheelchair Mobility    Modified Rankin (Stroke Patients Only)       Balance     Sitting balance-Leahy Scale: Good       Standing balance-Leahy Scale: Fair                      Cognition Arousal/Alertness: Awake/alert Behavior During Therapy: WFL for tasks assessed/performed Overall Cognitive Status: Within Functional Limits for tasks assessed                      Exercises      General Comments        Pertinent Vitals/Pain Pain Assessment: No/denies pain    Home Living                      Prior Function            PT Goals (current goals can now be found in the care plan section) Acute Rehab PT Goals Patient Stated Goal: to walk and go home PT Goal Formulation: With patient Time For Goal Achievement: 09/07/15 Potential to Achieve Goals: Good Progress towards PT goals: Progressing toward goals    Frequency  Min 3X/week    PT Plan Current plan remains appropriate    Co-evaluation             End of Session Equipment Utilized During Treatment:  Oxygen (able to maintain 93% with 2L O2) Activity Tolerance: Patient tolerated treatment well Patient left: in chair;with call bell/phone within reach     Time: 0935-0954 PT Time Calculation (min) (ACUTE ONLY): 19 min  Charges:  $Gait Training: 8-22 mins                    G Codes:      Morgan Fischer, Morgan Fischer 08/27/2015, 10:06 AM [phone]

## 2015-08-27 NOTE — Progress Notes (Signed)
Pharmacy Antibiotic Note Morgan BeachJulia Fischer is a 65 y.o. female admitted on 08/23/2015 with panniculitis. Currently on day 4 of Zosyn and vancomycin.  To transition to Bactrim and Augmentin 8/26  Plan: Augmentin 875 mg po BID starting 8/26 Bactrim DS starting 8/26  Pharmacy to sign off and follow peripherally for renal changes  Height: 5' 3.5" (161.3 cm) Weight: 293 lb 3.4 oz (133 kg) IBW/kg (Calculated) : 53.55  Temp (24hrs), Avg:98.2 F (36.8 C), Min:98 F (36.7 C), Max:98.3 F (36.8 C)   Recent Labs Lab 08/23/15 2120 08/23/15 2204 08/24/15 0858 08/25/15 0431 08/26/15 0737  WBC 12.3*  --   --   --   --   CREATININE 0.69  --  0.88 0.91 0.78  LATICACIDVEN  --  1.18  --   --   --     Estimated Creatinine Clearance: 93.3 mL/min (by C-G formula based on SCr of 0.8 mg/dL).    Allergies  Allergen Reactions  . Ciprocinonide [Fluocinolone] Other (See Comments)    Kidney failure  . Catapres [Clonidine Hcl] Rash  . Demadex [Torsemide] Rash    Thank you for allowing pharmacy to be a part of this patient's care.  Okey RegalLisa Rayven Hendrickson, PharmD [phone] 08/27/2015, 9:58 AM

## 2015-08-27 NOTE — Care Management Important Message (Signed)
Important Message  Patient Details  Name: Morgan Fischer MRN: [id number] Date of Birth: 12/12/1950   Medicare Important Message Given:  Yes    Jasmine Maceachern Stefan ChurchBratton 08/27/2015, 10:57 AM

## 2015-08-27 NOTE — Progress Notes (Signed)
PROGRESS NOTE                                                                                                                                                                                                             Patient Demographics:    Morgan Fischer, is a 65 y.o. female, DOB - July 05, 1950, XWR:[id number]  Admit date - 08/23/2015   Admitting Physician Lorretta Harp, MD  Outpatient Primary MD for the patient is OSEI-BONSU,GEORGE, MD  LOS - 3  Chief Complaint  Patient presents with  . Cellulitis       Brief Narrative     Morgan Fischer is a 65 y.o. female with medical history significant of hypertension, hyperlipidemia, diabetes mellitus, asthma, on home oxygen, gout, who presents with lower abdominal wall pain and redness and shortness of breath.   Patient reports that she has lower abdominal wall tenderness, redness and warmth for 3 days. She also has fever and chills. He had temperature 101 at one time at home. Patient also has worsening shortness of breath, which is worse when lying down. She has dry cough, but no chest pain. No tenderness over calf areas. Patient denies nausea, vomiting, diarrhea, abdominal pain. No symptoms of UTI.  ED Course: pt was found to have WBC 12.3, lactate 1.18, troponin 0.04, BNP 395.9, potassium 2.7, creatinine normal, temperature 98.2. CXR showedpulmonary vascular congestion. CT abdomen/pelvis showed panniculitis.EKG showed T-wave inversion in lateral leads and V4-V5. Pt was given 40 mg of IV lasix in ED. IV vanco and zosyn were started. Pt is placed on tele bed for Obs.    Subjective:    Morgan Fischer today has, No headache, No chest pain, No abdominal pain - No Nausea, No new weakness tingling or numbness, No Cough - SOB.     Assessment  & Plan :     1. Abdominal wall panniculitis. Placed on empiric IV vancomycin and Zosyn, clinically better, follow blood cultures, if stable will  transition to oral antibiotics on 08/28/2015 and discharge.  2. Acute on Chronic Diastolic CHF. Improving with IV Lasix, echocardiogram preserved EF of 55% without any wall motion abnormality and diastolic dysfunction, no previous echo in chart. Continue beta blocker and ARB for now along with fluid and salt restriction. Monitor intake and output and daily weights.  Filed Weights   08/23/15 2025 08/24/15 0223  08/27/15 0424  Weight: (!) 147.4 kg (325 lb) (!) 147.5 kg (325 lb 2.9 oz) 133 kg (293 lb 3.4 oz)    3. Morbid obesity. Follow with PCP post discharge for weight loss.  4. Hypokalemia and hypomagnesemia. Replaced will monitor.  5. Gout. Continue allopurinol.  6. Essential hypertension -  on combination of Norvasc, beta blocker, ARB and diuretic.  7. Non-ACS Pattern and Troponin elevation due to #2 above. On aspirin and beta blocker for secondary prevention, EKG nonspecific, she is chest pain-free, will check echocardiogram to evaluate wall motion and EF, a stable outpatient cardiac follow-up.  8. Asthma. Stable on home oxygen, continue supportive care.  9. DM type II. On Lantus and sliding scale will monitor CBGs.  CBG (last 3)   Recent Labs  08/26/15 1628 08/26/15 2042 08/27/15 0804  GLUCAP 177* 162* 87    Lab Results  Component Value Date   HGBA1C 10.1 (H) 08/24/2015     Family Communication  :  None present  Code Status :  Full  Diet : Heart Healthy Low Carb  Disposition Plan  :  Stay inpt  Consults  :  None  Procedures  :    TTE -  LVEF >70%, severely increased wall thickness, normal wall motion,  Grade 2 DD with high LV filling pressure, poorly visualized aortic valve, however there is a suggestion of at least mild aortic stenosis, heavily calcified mitral annulus, mild LAE,  trivial pericardial effusion. TEE may be helpful to further  characterize the aortic valve.  DVT Prophylaxis  :  Lovenox   Lab Results  Component Value Date   PLT 120 (L)  08/23/2015    Inpatient Medications  Scheduled Meds: . allopurinol  300 mg Oral Daily  . amLODipine  10 mg Oral Daily  . aspirin  325 mg Oral Daily  . atenolol  100 mg Oral Daily  . chlorthalidone  25 mg Oral Daily  . cholecalciferol  1,000 Units Oral Daily  . enoxaparin (LOVENOX) injection  70 mg Subcutaneous Daily  . furosemide  40 mg Intravenous BID  . insulin aspart  0-9 Units Subcutaneous TID WC  . insulin glargine  30 Units Subcutaneous BID  . losartan  50 mg Oral Daily  . montelukast  10 mg Oral QHS  . piperacillin-tazobactam (ZOSYN)  IV  3.375 g Intravenous Q8H  . potassium chloride SA  40 mEq Oral BID  . sodium chloride flush  3 mL Intravenous Q12H  . vancomycin  1,250 mg Intravenous Q12H   Continuous Infusions:  PRN Meds:.sodium chloride, acetaminophen, albuterol, cyclobenzaprine, dextromethorphan-guaiFENesin, ibuprofen, morphine injection, nitroGLYCERIN, ondansetron (ZOFRAN) IV, oxyCODONE-acetaminophen, sodium chloride flush, sodium chloride flush  Antibiotics  :    Anti-infectives    Start     Dose/Rate Route Frequency Ordered Stop   08/24/15 1200  vancomycin (VANCOCIN) 1,250 mg in sodium chloride 0.9 % 250 mL IVPB  Status:  Discontinued     1,250 mg 166.7 mL/hr over 90 Minutes Intravenous Every 12 hours 08/23/15 2236 08/24/15 0750   08/24/15 0900  vancomycin (VANCOCIN) 1,250 mg in sodium chloride 0.9 % 250 mL IVPB     1,250 mg 166.7 mL/hr over 90 Minutes Intravenous Every 12 hours 08/24/15 0750     08/24/15 0600  piperacillin-tazobactam (ZOSYN) IVPB 3.375 g     3.375 g 12.5 mL/hr over 240 Minutes Intravenous Every 8 hours 08/23/15 2236     08/23/15 2245  piperacillin-tazobactam (ZOSYN) IVPB 3.375 g     3.375 g  100 mL/hr over 30 Minutes Intravenous  Once 08/23/15 2234 08/24/15 0100   08/23/15 2245  vancomycin (VANCOCIN) IVPB 1000 mg/200 mL premix     1,000 mg 200 mL/hr over 60 Minutes Intravenous Every 1 hr x 2 08/23/15 2234 08/24/15 0044          Objective:   Vitals:   08/26/15 0927 08/26/15 1137 08/26/15 2043 08/27/15 0424  BP: (!) 117/52 (!) 109/53 (!) 134/57 (!) 132/51  Pulse: 68  63 61  Resp: 20  18 16   Temp: 97.9 F (36.6 C)  98.3 F (36.8 C) 98 F (36.7 C)  TempSrc: Oral  Oral Oral  SpO2: 98%  100% 98%  Weight:    133 kg (293 lb 3.4 oz)  Height:        Wt Readings from Last 3 Encounters:  08/27/15 133 kg (293 lb 3.4 oz)  07/05/13 (!) 158.8 kg (350 lb)     Intake/Output Summary (Last 24 hours) at 08/27/15 0919 Last data filed at 08/27/15 0557  Gross per 24 hour  Intake              240 ml  Output                0 ml  Net              240 ml     Physical Exam  Awake Alert, Oriented X 3, No new F.N deficits, Normal affect [place].AT,PERRAL Supple Neck,No JVD, No cervical lymphadenopathy appriciated.  Symmetrical Chest wall movement, Good air movement bilaterally, CTAB RRR,No Gallops,Rubs or new Murmurs, No Parasternal Heave +ve B.Sounds, Abd Soft, No tenderness, No organomegaly appriciated, No rebound - guarding or rigidity. No Cyanosis, Clubbing or edema, No new Rash or bruise, Large Pannus with cellulitis on the posterior aspect    Data Review:    CBC  Recent Labs Lab 08/23/15 2120  WBC 12.3*  HGB 11.5*  HCT 35.4*  PLT 120*  MCV 86.3  MCH 28.0  MCHC 32.5  RDW 15.8*  LYMPHSABS 1.1  MONOABS 0.7  EOSABS 0.0  BASOSABS 0.0    Chemistries   Recent Labs Lab 08/23/15 2120 08/24/15 0858 08/25/15 0431 08/26/15 0737  NA 136 139 142 142  K 2.7* 3.4* 3.5 3.7  CL 95* 98* 98* 100*  CO2 32 31 35* 35*  GLUCOSE 122* 229* 140* 130*  BUN 20 15 18 19   CREATININE 0.69 0.88 0.91 0.78  CALCIUM 8.7* 8.5* 8.4* 8.2*  MG 1.4* 1.6* 2.0  --    ------------------------------------------------------------------------------------------------------------------ No results for input(s): CHOL, HDL, LDLCALC, TRIG, CHOLHDL, LDLDIRECT in the last 72 hours.  Lab Results  Component Value Date   HGBA1C 10.1 (H)  08/24/2015   ------------------------------------------------------------------------------------------------------------------ No results for input(s): TSH, T4TOTAL, T3FREE, THYROIDAB in the last 72 hours.  Invalid input(s): FREET3 ------------------------------------------------------------------------------------------------------------------ No results for input(s): VITAMINB12, FOLATE, FERRITIN, TIBC, IRON, RETICCTPCT in the last 72 hours.  Coagulation profile  Recent Labs Lab 08/24/15 0422  INR 1.11    No results for input(s): DDIMER in the last 72 hours.  Cardiac Enzymes  Recent Labs Lab 08/24/15 0422 08/24/15 0858 08/24/15 1508  TROPONINI 0.03* 0.03* <0.03   ------------------------------------------------------------------------------------------------------------------    Component Value Date/Time   BNP 395.9 (H) 08/23/2015 2120    Micro Results Recent Results (from the past 240 hour(s))  Culture, blood (routine x 2)     Status: None (Preliminary result)   Collection Time: 08/24/15  4:01 AM  Result Value Ref  Range Status   Specimen Description BLOOD RIGHT HAND  Final   Special Requests BOTTLES DRAWN AEROBIC ONLY 5CC  Final   Culture NO GROWTH 2 DAYS  Final   Report Status PENDING  Incomplete  Culture, blood (routine x 2)     Status: None (Preliminary result)   Collection Time: 08/24/15  4:20 AM  Result Value Ref Range Status   Specimen Description BLOOD RIGHT WRIST  Final   Special Requests IN PEDIATRIC BOTTLE 1CC  Final   Culture NO GROWTH 2 DAYS  Final   Report Status PENDING  Incomplete    Radiology Reports Dg Chest 2 View  Result Date: 08/23/2015 CLINICAL DATA:  Chronic shortness of breath, hypoxia EXAM: CHEST  2 VIEW COMPARISON:  10/06/2007 FINDINGS: Cardiomegaly with pulmonary vascular congestion. No frank interstitial edema. Mild right basilar scarring/ atelectasis. No pleural effusion or pneumothorax. Mild degenerative changes of the visualized  thoracolumbar spine. IMPRESSION: Cardiomegaly with pulmonary vascular congestion. No frank interstitial edema. Mild right basilar scarring/ atelectasis, chronic. Electronically Signed   By: Charline Bills M.D.   On: 08/23/2015 21:39   Ct Abdomen Pelvis W Contrast  Result Date: 08/23/2015 CLINICAL DATA:  Lower abdominal redness, warmth and tenderness for days. Panniculitis or deep infection. EXAM: CT ABDOMEN AND PELVIS WITH CONTRAST TECHNIQUE: Multidetector CT imaging of the abdomen and pelvis was performed using the standard protocol following bolus administration of intravenous contrast. CONTRAST:  ISOVUE-300 IOPAMIDOL (ISOVUE-300) INJECTION 61% COMPARISON:  None. FINDINGS: Lower chest: The included lung bases are clear. Multi chamber cardiomegaly. There are coronary artery calcifications. Mild motion artifact. Liver: Enlarged measuring 24 cm cranial caudal. There is an 11 mm hypodensity in left lobe, incompletely characterized, but likely cyst. Hypodensity adjacent with falciform ligament is likely focal fatty infiltration. Hepatobiliary: Gallbladder physiologically distended. Probable intraluminal gallstone. No pericholecystic inflammation. No biliary dilatation. Pancreas: No ductal dilatation or inflammation. Spleen: Normal. Adrenal glands: No nodule. Kidneys: Symmetric renal enhancement. No hydronephrosis. Symmetric excretion on delayed phase imaging. There is mild nonspecific perinephric edema about the lower poles bilaterally. Stomach/Bowel: Stomach is decompressed. There are no dilated or thickened small bowel loops. Small volume of stool throughout the colon without colonic wall thickening. Minimal diverticulosis of the descending colon without acute diverticulitis. The appendix is tentatively but not definitively identified. No pericecal inflammation. Vascular/Lymphatic: Prominent right external iliac node measures 14 mm short axis. Similar prominent left external iliac node. Increased number  of multiple bilateral inguinal lymph nodes. No retroperitoneal adenopathy. Abdominal aorta is normal in caliber. Mild atherosclerosis of the abdominal aorta and its branches, no aneurysm. Reproductive: Post hysterectomy.  No adnexal mass. Bladder: Minimally distended without wall thickening. Other: Large lower abdominal pannus demonstrates marked skin thickening and subcutaneous reticulation. There is no focal fluid collection. No soft tissue air. No evidence of necrotizing soft tissue infection. There is air small fat containing umbilical hernia. Prominent vessels in the subcutaneous pannus. No peroneal inflammation. New free intra-abdominal air or free fluid. Musculoskeletal: There are no acute or suspicious osseous abnormalities. Multilevel degenerative change throughout the spine. There is laxity and atrophy of the anterior abdominal wall musculature. IMPRESSION: 1. Superficial panniculitis involving the inferior abdominal pannus with skin thickening and soft tissue edema. No focal fluid collection or evidence of evidence of deep soft tissue infection. Prominent inguinal and external iliac nodes are likely reactive. 2. No acute intra-abdominal/pelvic abnormality. 3. Incidental finding of hepatomegaly. Probable cyst on the left lobe of the liver. Minimal colonic diverticulosis in the descending colon without  diverticulitis. Electronically Signed   By: Rubye Oaks M.D.   On: 08/23/2015 22:40    Time Spent in minutes  30   SINGH,PRASHANT K M.D on 08/27/2015 at 9:19 AM  Between 7am to 7pm - Pager - [phone]  After 7pm go to [email or web address] - password [facility]  [facility] -  Office  [phone]

## 2015-08-27 NOTE — Care Management Note (Addendum)
Case Management Note  Patient Details  Name: Morgan Fischer MRN: [id number] Date of Birth: April 15, 1950  Subjective/Objective:           CM following for progression and d/c planning.          Action/Plan: 8/23 /17 Met with pt and discussed possible d/c needs. Pt uses cane and has a ramp await PT/OT evals and recommendations. Pt also already on home oxygen.  08/26/15 Informed by PT that pt may need bariatric cane.  08/27/2015 Met with pt and list of [place] providers given to pt for review. Pt states that she will try a rollator today for possible use at home. This CM will follow for decision as to what the pt will actually need at the time of d/c. We will be unable to get the cane and rollator both. Will followup after PT has completed evaluation.  11:25 Pt notified this CM that she has selected [facility] for [place] [facility] and [place] . [place] notified , will follow for further needs.   Expected Discharge Date:                  Expected Discharge Plan:  [place]  In-House Refrral:  NA  Discharge planning Services  CM Consult  Post Acute Care Choice:  NA, Durable Medical Equipment Choice offered to:  Patient  DME Arranged:  Walker rolling with seat DME Agency:  [place] Arranged:  RN, PT [facility] Agency:     Status of Service:  In process, will continue to follow  If discussed at Long Length of Stay Meetings, dates discussed:    Additional Comments:  Adron Bene, RN 08/27/2015, 10:38 AM

## 2015-08-27 NOTE — Progress Notes (Signed)
Physical Therapy Treatment Patient Details Name: Morgan Fischer MRN: [id number] DOB: 26-Apr-1950 Today's Date: 08/27/2015    History of Present Illness 65 yo female with onset of abdominal edema and erythema diagnosed as panniculitis, had elevated troponins and liver enlargement, diverticulosis.  PHx:  O2 2L at baseline, HTN, gout,     PT Comments    Instructed pt in use of 4 wheeled RW, she demonstrated good understanding of safe use of it and decided she'd like one for home.   Follow Up Recommendations  Home health PT;Supervision - Intermittent     Equipment Recommendations  Other (comment) (wide rollator (4 wheeled rolling walker))    Recommendations for Other Services       Precautions / Restrictions Precautions Precautions: Fall Precaution Comments: using cane at baseline, no h/o falls but pt reports knees feel like they'll "give out", on 2L O2 at baseline Restrictions Weight Bearing Restrictions: No    Mobility  Bed Mobility               General bed mobility comments: sitting EOB when PT arrived  Transfers Overall transfer level: Modified independent Equipment used: 4-wheeled walker Transfers: Sit to/from Stand Sit to Stand: Modified independent (Device/Increase time)         General transfer comment: good hand placement  Ambulation/Gait Ambulation/Gait assistance: Supervision Ambulation Distance (Feet): 150 Feet Assistive device: 4-wheeled walker Gait Pattern/deviations: Step-through pattern;Wide base of support;Decreased stride length Gait velocity: reduced Gait velocity interpretation: at or above normal speed for age/gender General Gait Details: trial of rollator, instructed pt in use of brakes and seat, she demonstrated good understanding, no LOB   Stairs            Wheelchair Mobility    Modified Rankin (Stroke Patients Only)       Balance Overall balance assessment: Modified Independent   Sitting balance-Leahy Scale: Good        Standing balance-Leahy Scale: Fair                      Cognition Arousal/Alertness: Awake/alert Behavior During Therapy: WFL for tasks assessed/performed Overall Cognitive Status: Within Functional Limits for tasks assessed                      Exercises      General Comments        Pertinent Vitals/Pain Pain Assessment: No/denies pain    Home Living                      Prior Function            PT Goals (current goals can now be found in the care plan section) Acute Rehab PT Goals Patient Stated Goal: to walk and go home PT Goal Formulation: With patient Time For Goal Achievement: 09/07/15 Potential to Achieve Goals: Good Progress towards PT goals: Progressing toward goals    Frequency  Min 3X/week    PT Plan Current plan remains appropriate    Co-evaluation             End of Session Equipment Utilized During Treatment: Oxygen (3L O2 with walking) Activity Tolerance: Patient tolerated treatment well Patient left: with call bell/phone within reach;in bed;with family/visitor present     Time: 7829-5621 PT Time Calculation (min) (ACUTE ONLY): 14 min  Charges:  $Gait Training: 8-22 mins  G Codes:      [name], [name] 08/27/2015, 1:37 PM [phone]

## 2015-08-28 LAB — GLUCOSE, CAPILLARY
GLUCOSE-CAPILLARY: 130 mg/dL — AB (ref 65–99)
GLUCOSE-CAPILLARY: 183 mg/dL — AB (ref 65–99)

## 2015-08-28 MED ORDER — ATENOLOL 100 MG PO TABS: 100.0000 mg | ORAL_TABLET | Freq: Every day | ORAL | 0 refills | Status: DC

## 2015-08-28 MED ORDER — FUROSEMIDE 40 MG PO TABS: 80.0000 mg | ORAL_TABLET | Freq: Two times a day (BID) | ORAL | 0 refills | Status: DC

## 2015-08-28 MED ORDER — AMOXICILLIN-POT CLAVULANATE 875-125 MG PO TABS: 1.0000 | ORAL_TABLET | Freq: Two times a day (BID) | ORAL | 0 refills | Status: DC

## 2015-08-28 MED ORDER — FUROSEMIDE 40 MG PO TABS: 40.0000 mg | ORAL_TABLET | Freq: Two times a day (BID) | ORAL | 0 refills | Status: DC

## 2015-08-28 MED ORDER — POTASSIUM CHLORIDE CRYS ER 20 MEQ PO TBCR: 40.0000 meq | EXTENDED_RELEASE_TABLET | Freq: Two times a day (BID) | ORAL | 0 refills | Status: DC

## 2015-08-28 MED ORDER — SULFAMETHOXAZOLE-TRIMETHOPRIM 800-160 MG PO TABS: 1.0000 | ORAL_TABLET | Freq: Two times a day (BID) | ORAL | 0 refills | Status: DC

## 2015-08-28 MED ORDER — NITROGLYCERIN 0.4 MG SL SUBL: 0.4000 mg | SUBLINGUAL_TABLET | SUBLINGUAL | 0 refills | Status: DC | PRN

## 2015-08-28 NOTE — Care Management Note (Signed)
Case Management Note  Patient Details  Name: Morgan BeachJulia Fischer MRN: [id number] Date of Birth: 10/24/1950  Subjective/Objective:                    Action/Plan: Anticipate discharge home today with [facility].Reggie made aware of need for Rollator.  No further CM needs but will be available should additional discharge needs arise.   Expected Discharge Date:                  Expected Discharge Plan:  Home w Home Health Services  In-House Referral:  NA  Discharge planning Services  CM Consult  Post Acute Care Choice:  NA, Durable Medical Equipment Choice offered to:  Patient  DME Arranged:  Walker rolling with seat DME Agency:  [facility]  [facility]  [facility]:     Status of Service:  Completed, signed off  If discussed at Long Length of Stay Meetings, dates discussed:    Additional Comments:  Yvone NeuCrutchfield, Kaymon Denomme M, RN 08/28/2015, 9:58 AM

## 2015-08-28 NOTE — Progress Notes (Signed)
Patient requested information about heart healthy and carb modified. Information provided. Dietitian consult requested. Will continue to monitor. Bess KindsGWALTNEY, Hibo Blasdell B, RN

## 2015-08-28 NOTE — Discharge Summary (Signed)
Morgan Fischer UJW:[id number] DOB: [birth year] DOA: 08/23/2015  PCP: Jackie Plum, MD  Admit date: 08/23/2015  Discharge date: 08/28/2015  Admitted From: Home   Disposition:  Home   Recommendations for Outpatient Follow-up:   Follow up with PCP in 1-2 weeks  PCP Please obtain BMP/CBC, 2 view CXR in 1week,  (see Discharge instructions)   PCP Please follow up on the following pending results: None   Home Health: HHPT-RN   Equipment/Devices: None  Consultations: None Discharge Condition: Stable   CODE STATUS: Full   Diet Recommendation: Heart Healthy Low Carb   Chief Complaint  Patient presents with  . Cellulitis     Brief history of present illness from the day of admission and additional interim summary    Morgan Fischer a 65 y.o.femalewith medical history significant ofhypertension, hyperlipidemia, diabetes mellitus, asthma, on home oxygen, gout, who presents with lower abdominal wall pain and redness and shortness of breath.   Patient reports that she has lower abdominal wall tenderness, redness and warmth for 3 days. She also has fever and chills. He had temperature 101 at one time at home. Patient also has worsening shortness of breath, which is worse when lying down. She has dry cough, but no chest pain. No tenderness over calf areas. Patient denies nausea, vomiting, diarrhea, abdominal pain. No symptoms of UTI.  ED Course:pt was found to have WBC 12.3, lactate 1.18, troponin 0.04, BNP 395.9, potassium 2.7, creatinine normal, temperature 98.2. CXR showedpulmonary vascular congestion.CT abdomen/pelvis showed panniculitis.EKG showed T-wave inversion in lateral leads and V4-V5. Pt was given 40 mg of IV lasix in ED. IV vanco and zosyn were started. Pt is placed on tele bed for Obs.  Hospital  issues addressed       1. Abdominal wall panniculitis. Placed on empiric IV vancomycin and Zosyn, clinically better, negative blood cultures, clinically cellulitis has almost completely resolved without any warmth or redness at this time, will be placed on 1 week of oral antibiotics as below and discharged home with PCP follow-up.  2. Acute on Chronic Diastolic CHF. ImprovED with IV Lasix, echocardiogram preserved EF of 70% without any wall motion abnormality and diastolic dysfunction, no previous echo in chart. Continue beta blocker and ARB for now along with fluid and salt restriction. Monitor intake and output and daily weights. Upon discharge home Lasix dose increased to 40 mg twice a day she was previously taking 60 mg a day. PCP to monitor weight, BMP and diuretic dose closely.  3. Morbid obesity. Follow with PCP post discharge for weight loss.  4. Hypokalemia and hypomagnesemia. Replaced will monitor.  5. Gout. Continue allopurinol.  6. Essential hypertension -  on combination of Norvasc, beta blocker, ARB and diuretic.  7. Non-ACS Pattern and Troponin elevation due to #2 above. On aspirin and beta blocker for secondary prevention, EKG nonspecific, she was chest pain-free, echocardiogram showED preserved EF of 70% without any wall motion abnormality, could have nonspecific aortic valve changes for which outpatient cardiology follow-up is recommended in 1-2 weeks.  8. Asthma.  Stable on home oxygen, continue supportive care.  9. DM type II. Continue home regimen and follow with PCP for glycemic control.  CBG (last 3)   Recent Labs  08/27/15 1651 08/27/15 2031 08/28/15 0723  GLUCAP 178* 149* 130*      Discharge diagnosis     Principal Problem:   Panniculitis Active Problems:   Elevated troponin   Morbid obesity (HCC)   Hypertension   Diabetes mellitus without complication (HCC)   Asthma   SOB (shortness of breath)   Hypokalemia    Hypomagnesemia    Discharge instructions    Discharge Instructions    Discharge instructions    Complete by:  As directed   Follow with Primary MD OSEI-BONSU,GEORGE, MD in 7 days   Get CBC, CMP, 2 view Chest X ray checked  by Primary MD or SNF MD in 5-7 days ( we routinely change or add medications that can affect your baseline labs and fluid status, therefore we recommend that you get the mentioned basic workup next visit with your PCP, your PCP may decide not to get them or add new tests based on their clinical decision)   Activity: As tolerated with Full fall precautions use walker/cane & assistance as needed   Disposition Home    Diet:   Heart Healthy Low Carb.  Check your Weight same time everyday, if you gain over 2 pounds, or you develop in leg swelling, experience more shortness of breath or chest pain, call your Primary MD immediately. Follow Cardiac Low Salt Diet and 1.5 lit/day fluid restriction.   On your next visit with your primary care physician please Get Medicines reviewed and adjusted.   Please request your Prim.MD to go over all Hospital Tests and Procedure/Radiological results at the follow up, please get all Hospital records sent to your Prim MD by signing hospital release before you go home.   If you experience worsening of your admission symptoms, develop shortness of breath, life threatening emergency, suicidal or homicidal thoughts you must seek medical attention immediately by calling 911 or calling your MD immediately  if symptoms less severe.  You Must read complete instructions/literature along with all the possible adverse reactions/side effects for all the Medicines you take and that have been prescribed to you. Take any new Medicines after you have completely understood and accpet all the possible adverse reactions/side effects.   Do not drive, operate heavy machinery, perform activities at heights, swimming or participation in water activities or  provide baby sitting services if your were admitted for syncope or siezures until you have seen by Primary MD or a Neurologist and advised to do so again.  Do not drive when taking Pain medications.    Do not take more than prescribed Pain, Sleep and Anxiety Medications  Special Instructions: If you have smoked or chewed Tobacco  in the last 2 yrs please stop smoking, stop any regular Alcohol  and or any Recreational drug use.  Wear Seat belts while driving.   Please note  You were cared for by a hospitalist during your hospital stay. If you have any questions about your discharge medications or the care you received while you were in the hospital after you are discharged, you can call the unit and asked to speak with the hospitalist on call if the hospitalist that took care of you is not available. Once you are discharged, your primary care physician will handle any further medical issues. Please note that NO REFILLS for any discharge  medications will be authorized once you are discharged, as it is imperative that you return to your primary care physician (or establish a relationship with a primary care physician if you do not have one) for your aftercare needs so that they can reassess your need for medications and monitor your lab values.   Increase activity slowly    Complete by:  As directed      Discharge Medications     Medication List    STOP taking these medications   atenolol-chlorthalidone 100-25 MG tablet Commonly known as:  TENORETIC   insulin NPH-regular Human (70-30) 100 UNIT/ML injection Commonly known as:  NOVOLIN 70/30     TAKE these medications   ACCU-CHEK AVIVA PLUS test strip Generic drug:  glucose blood Inject 1 applicator into the skin as needed.   allopurinol 300 MG tablet Commonly known as:  ZYLOPRIM Take 300 mg by mouth daily.   amLODipine 10 MG tablet Commonly known as:  NORVASC Take 10 mg by mouth daily.   amoxicillin-clavulanate 875-125 MG  tablet Commonly known as:  AUGMENTIN Take 1 tablet by mouth every 12 (twelve) hours.   aspirin 325 MG EC tablet Take 325 mg by mouth daily.   atenolol 100 MG tablet Commonly known as:  TENORMIN Take 1 tablet (100 mg total) by mouth daily.   cholecalciferol 1000 units tablet Commonly known as:  VITAMIN D Take 1,000 Units by mouth daily.   cyclobenzaprine 5 MG tablet Commonly known as:  FLEXERIL Take 1 tablet (5 mg total) by mouth 3 (three) times daily as needed for muscle spasms.   furosemide 40 MG tablet Commonly known as:  LASIX Take 1 tablet (40 mg total) by mouth 2 (two) times daily. What changed:  medication strength   glimepiride 4 MG tablet Commonly known as:  AMARYL Take 4 mg by mouth 2 (two) times daily.   ibuprofen 600 MG tablet Commonly known as:  ADVIL,MOTRIN Take 1 tablet (600 mg total) by mouth every 6 (six) hours as needed.   insulin glargine 100 UNIT/ML injection Commonly known as:  LANTUS Inject 30 Units into the skin every evening.   LANTUS SOLOSTAR 100 UNIT/ML Solostar Pen Generic drug:  Insulin Glargine Inject 40 Units into the skin every morning. 40 QAM and 30 every evening   losartan 50 MG tablet Commonly known as:  COZAAR Take 50 mg by mouth daily.   metFORMIN 1000 MG tablet Commonly known as:  GLUCOPHAGE Take 1,000 mg by mouth 2 (two) times daily with a meal.   montelukast 10 MG tablet Commonly known as:  SINGULAIR Take 10 mg by mouth at bedtime.   nitroGLYCERIN 0.4 MG SL tablet Commonly known as:  NITROSTAT Place 1 tablet (0.4 mg total) under the tongue every 5 (five) minutes as needed for chest pain.   potassium chloride SA 20 MEQ tablet Commonly known as:  K-DUR,KLOR-CON Take 2 tablets (40 mEq total) by mouth 2 (two) times daily. What changed:  how much to take   sulfamethoxazole-trimethoprim 800-160 MG tablet Commonly known as:  BACTRIM DS,SEPTRA DS Take 1 tablet by mouth every 12 (twelve) hours.   TRULICITY Regent Inject 1  application into the skin once a week.       Follow-up Information    OSEI-BONSU,GEORGE, MD. Schedule an appointment as soon as possible for a visit in 1 week(s).   Specialty:  Internal Medicine Contact information: [address] [place] [address] [phone]  Donato Schultz, MD. Schedule an appointment as soon as possible for a visit in 2 week(s).   Specialty:  Cardiology Why:  repeat Echo in 2 weeks Contact information: [address] [place] [address] [phone]           Major procedures and Radiology Reports - PLEASE review detailed and final reports thoroughly  -      TTE -  LVEF >70%, severely increased wall thickness, normal wall motion, Grade 2 DD with high LV filling pressure, poorly visualized aortic valve, however there is a suggestion of at least mildaortic stenosis, heavily calcified mitral annulus, mild LAE, trivial pericardial effusion. TEE may be helpful to further characterize the aortic valve.   Dg Chest 2 View  Result Date: 08/23/2015 CLINICAL DATA:  Chronic shortness of breath, hypoxia EXAM: CHEST  2 VIEW COMPARISON:  10/06/2007 FINDINGS: Cardiomegaly with pulmonary vascular congestion. No frank interstitial edema. Mild right basilar scarring/ atelectasis. No pleural effusion or pneumothorax. Mild degenerative changes of the visualized thoracolumbar spine. IMPRESSION: Cardiomegaly with pulmonary vascular congestion. No frank interstitial edema. Mild right basilar scarring/ atelectasis, chronic. Electronically Signed   By: Charline Bills M.D.   On: 08/23/2015 21:39   Ct Abdomen Pelvis W Contrast  Result Date: 08/23/2015 CLINICAL DATA:  Lower abdominal redness, warmth and tenderness for days. Panniculitis or deep infection. EXAM: CT ABDOMEN AND PELVIS WITH CONTRAST TECHNIQUE: Multidetector CT imaging of the abdomen and pelvis was performed using the standard protocol following bolus administration of  intravenous contrast. CONTRAST:  ISOVUE-300 IOPAMIDOL (ISOVUE-300) INJECTION 61% COMPARISON:  None. FINDINGS: Lower chest: The included lung bases are clear. Multi chamber cardiomegaly. There are coronary artery calcifications. Mild motion artifact. Liver: Enlarged measuring 24 cm cranial caudal. There is an 11 mm hypodensity in left lobe, incompletely characterized, but likely cyst. Hypodensity adjacent with falciform ligament is likely focal fatty infiltration. Hepatobiliary: Gallbladder physiologically distended. Probable intraluminal gallstone. No pericholecystic inflammation. No biliary dilatation. Pancreas: No ductal dilatation or inflammation. Spleen: Normal. Adrenal glands: No nodule. Kidneys: Symmetric renal enhancement. No hydronephrosis. Symmetric excretion on delayed phase imaging. There is mild nonspecific perinephric edema about the lower poles bilaterally. Stomach/Bowel: Stomach is decompressed. There are no dilated or thickened small bowel loops. Small volume of stool throughout the colon without colonic wall thickening. Minimal diverticulosis of the descending colon without acute diverticulitis. The appendix is tentatively but not definitively identified. No pericecal inflammation. Vascular/Lymphatic: Prominent right external iliac node measures 14 mm short axis. Similar prominent left external iliac node. Increased number of multiple bilateral inguinal lymph nodes. No retroperitoneal adenopathy. Abdominal aorta is normal in caliber. Mild atherosclerosis of the abdominal aorta and its branches, no aneurysm. Reproductive: Post hysterectomy.  No adnexal mass. Bladder: Minimally distended without wall thickening. Other: Large lower abdominal pannus demonstrates marked skin thickening and subcutaneous reticulation. There is no focal fluid collection. No soft tissue air. No evidence of necrotizing soft tissue infection. There is air small fat containing umbilical hernia. Prominent vessels in the  subcutaneous pannus. No peroneal inflammation. New free intra-abdominal air or free fluid. Musculoskeletal: There are no acute or suspicious osseous abnormalities. Multilevel degenerative change throughout the spine. There is laxity and atrophy of the anterior abdominal wall musculature. IMPRESSION: 1. Superficial panniculitis involving the inferior abdominal pannus with skin thickening and soft tissue edema. No focal fluid collection or evidence of evidence of deep soft tissue infection. Prominent inguinal and external iliac nodes are likely reactive. 2. No acute intra-abdominal/pelvic abnormality. 3. Incidental finding  of hepatomegaly. Probable cyst on the left lobe of the liver. Minimal colonic diverticulosis in the descending colon without diverticulitis. Electronically Signed   By: Rubye OaksMelanie  Ehinger M.D.   On: 08/23/2015 22:40    Micro Results    Recent Results (from the past 240 hour(s))  Culture, blood (routine x 2)     Status: None (Preliminary result)   Collection Time: 08/24/15  4:01 AM  Result Value Ref Range Status   Specimen Description BLOOD RIGHT HAND  Final   Special Requests BOTTLES DRAWN AEROBIC ONLY 5CC  Final   Culture NO GROWTH 3 DAYS  Final   Report Status PENDING  Incomplete  Culture, blood (routine x 2)     Status: None (Preliminary result)   Collection Time: 08/24/15  4:20 AM  Result Value Ref Range Status   Specimen Description BLOOD RIGHT WRIST  Final   Special Requests IN PEDIATRIC BOTTLE 1CC  Final   Culture NO GROWTH 3 DAYS  Final   Report Status PENDING  Incomplete    Today   Subjective    Hollie BeachJulia Modeste today has no headache,no chest abdominal pain,no new weakness tingling or numbness, feels much better wants to go home today.     Objective   Blood pressure 119/68, pulse (!) 58, temperature 97.8 F (36.6 C), temperature source Oral, resp. rate (!) 8, height 5' 3.5" (1.613 m), weight 119.4 kg (263 lb 3.7 oz), SpO2 99 %.   Intake/Output Summary (Last 24  hours) at 08/28/15 0938 Last data filed at 08/28/15 0937  Gross per 24 hour  Intake             1080 ml  Output                1 ml  Net             1079 ml    Exam Awake Alert, Oriented x 3, No new F.N deficits, Normal affect [place].AT,PERRAL Supple Neck,No JVD, No cervical lymphadenopathy appriciated.  Symmetrical Chest wall movement, Good air movement bilaterally, CTAB RRR,No Gallops,Rubs or new Murmurs, No Parasternal Heave +ve B.Sounds, Abd Soft, Non tender, No organomegaly appriciated, No rebound -guarding or rigidity. Large Pannus with mild cellulitis on the posterior aspect No Cyanosis, Clubbing or edema, No new Rash or bruise   Data Review   CBC w Diff:  Lab Results  Component Value Date   WBC 12.3 (H) 08/23/2015   HGB 11.5 (L) 08/23/2015   HCT 35.4 (L) 08/23/2015   PLT 120 (L) 08/23/2015   LYMPHOPCT 9 08/23/2015   MONOPCT 6 08/23/2015   EOSPCT 0 08/23/2015   BASOPCT 0 08/23/2015    CMP:  Lab Results  Component Value Date   NA 142 08/26/2015   K 3.7 08/26/2015   CL 100 (L) 08/26/2015   CO2 35 (H) 08/26/2015   BUN 19 08/26/2015   CREATININE 0.78 08/26/2015  .   Total Time in preparing paper work, data evaluation and todays exam - 35 minutes  Leroy SeaSINGH,Treana Lacour K M.D on 08/28/2015 at 9:38 AM  [facility]   Office  [phone]

## 2015-08-28 NOTE — Discharge Instructions (Signed)
Follow with Primary MD OSEI-BONSU,GEORGE, MD in 7 days   Get CBC, CMP, 2 view Chest X ray checked  by Primary MD or SNF MD in 5-7 days ( we routinely change or add medications that can affect your baseline labs and fluid status, therefore we recommend that you get the mentioned basic workup next visit with your PCP, your PCP may decide not to get them or add new tests based on their clinical decision)   Activity: As tolerated with Full fall precautions use walker/cane & assistance as needed   Disposition Home    Diet:   Heart Healthy Low Carb.  Check your Weight same time everyday, if you gain over 2 pounds, or you develop in leg swelling, experience more shortness of breath or chest pain, call your Primary MD immediately. Follow Cardiac Low Salt Diet and 1.5 lit/day fluid restriction.   On your next visit with your primary care physician please Get Medicines reviewed and adjusted.   Please request your Prim.MD to go over all Hospital Tests and Procedure/Radiological results at the follow up, please get all Hospital records sent to your Prim MD by signing hospital release before you go home.   If you experience worsening of your admission symptoms, develop shortness of breath, life threatening emergency, suicidal or homicidal thoughts you must seek medical attention immediately by calling 911 or calling your MD immediately  if symptoms less severe.  You Must read complete instructions/literature along with all the possible adverse reactions/side effects for all the Medicines you take and that have been prescribed to you. Take any new Medicines after you have completely understood and accpet all the possible adverse reactions/side effects.   Do not drive, operate heavy machinery, perform activities at heights, swimming or participation in water activities or provide baby sitting services if your were admitted for syncope or siezures until you have seen by Primary MD or a Neurologist and  advised to do so again.  Do not drive when taking Pain medications.    Do not take more than prescribed Pain, Sleep and Anxiety Medications  Special Instructions: If you have smoked or chewed Tobacco  in the last 2 yrs please stop smoking, stop any regular Alcohol  and or any Recreational drug use.  Wear Seat belts while driving.   Please note  You were cared for by a hospitalist during your hospital stay. If you have any questions about your discharge medications or the care you received while you were in the hospital after you are discharged, you can call the unit and asked to speak with the hospitalist on call if the hospitalist that took care of you is not available. Once you are discharged, your primary care physician will handle any further medical issues. Please note that NO REFILLS for any discharge medications will be authorized once you are discharged, as it is imperative that you return to your primary care physician (or establish a relationship with a primary care physician if you do not have one) for your aftercare needs so that they can reassess your need for medications and monitor your lab values.

## 2015-08-28 NOTE — Progress Notes (Signed)
Patient discharged per MD. Instructions reviewed. Education provided for outpt followup and diet education for heart healthy and carb modified. Handouts provided and encouraged to followup with ADA and AHA websites online, and to ask PCP about dietitian follow up. Pt verbalized understanding, and teach back used to evaluate. IV team discontinued powerglide IV access. Spoke with CM about rollator. [facility]. Scripts provided. NT escorted patient out via wheelchair. Bess KindsGWALTNEY, Allyne Hebert B, RN

## 2015-08-29 LAB — CULTURE, BLOOD (ROUTINE X 2)
CULTURE: NO GROWTH
Culture: NO GROWTH

## 2015-08-30 DIAGNOSIS — M109 Gout, unspecified: Secondary | ICD-10-CM | POA: Diagnosis not present

## 2015-08-30 DIAGNOSIS — R269 Unspecified abnormalities of gait and mobility: Secondary | ICD-10-CM | POA: Diagnosis not present

## 2015-08-30 DIAGNOSIS — M793 Panniculitis, unspecified: Secondary | ICD-10-CM | POA: Diagnosis not present

## 2015-08-30 DIAGNOSIS — I5033 Acute on chronic diastolic (congestive) heart failure: Secondary | ICD-10-CM | POA: Diagnosis not present

## 2015-08-30 DIAGNOSIS — J45909 Unspecified asthma, uncomplicated: Secondary | ICD-10-CM | POA: Diagnosis not present

## 2015-08-30 DIAGNOSIS — E785 Hyperlipidemia, unspecified: Secondary | ICD-10-CM | POA: Diagnosis not present

## 2015-08-30 DIAGNOSIS — E119 Type 2 diabetes mellitus without complications: Secondary | ICD-10-CM | POA: Diagnosis not present

## 2015-08-30 DIAGNOSIS — Z794 Long term (current) use of insulin: Secondary | ICD-10-CM | POA: Diagnosis not present

## 2015-08-30 DIAGNOSIS — M356 Relapsing panniculitis [Weber-Christian]: Secondary | ICD-10-CM | POA: Diagnosis not present

## 2015-08-30 DIAGNOSIS — I110 Hypertensive heart disease with heart failure: Secondary | ICD-10-CM | POA: Diagnosis not present

## 2015-08-30 DIAGNOSIS — J449 Chronic obstructive pulmonary disease, unspecified: Secondary | ICD-10-CM | POA: Diagnosis not present

## 2015-08-31 DIAGNOSIS — I110 Hypertensive heart disease with heart failure: Secondary | ICD-10-CM | POA: Diagnosis not present

## 2015-08-31 DIAGNOSIS — Z794 Long term (current) use of insulin: Secondary | ICD-10-CM | POA: Diagnosis not present

## 2015-08-31 DIAGNOSIS — M793 Panniculitis, unspecified: Secondary | ICD-10-CM | POA: Diagnosis not present

## 2015-08-31 DIAGNOSIS — M109 Gout, unspecified: Secondary | ICD-10-CM | POA: Diagnosis not present

## 2015-08-31 DIAGNOSIS — E785 Hyperlipidemia, unspecified: Secondary | ICD-10-CM | POA: Diagnosis not present

## 2015-08-31 DIAGNOSIS — J45909 Unspecified asthma, uncomplicated: Secondary | ICD-10-CM | POA: Diagnosis not present

## 2015-08-31 DIAGNOSIS — E119 Type 2 diabetes mellitus without complications: Secondary | ICD-10-CM | POA: Diagnosis not present

## 2015-08-31 DIAGNOSIS — I5033 Acute on chronic diastolic (congestive) heart failure: Secondary | ICD-10-CM | POA: Diagnosis not present

## 2015-09-02 DIAGNOSIS — I110 Hypertensive heart disease with heart failure: Secondary | ICD-10-CM | POA: Diagnosis not present

## 2015-09-02 DIAGNOSIS — Z794 Long term (current) use of insulin: Secondary | ICD-10-CM | POA: Diagnosis not present

## 2015-09-02 DIAGNOSIS — M109 Gout, unspecified: Secondary | ICD-10-CM | POA: Diagnosis not present

## 2015-09-02 DIAGNOSIS — E119 Type 2 diabetes mellitus without complications: Secondary | ICD-10-CM | POA: Diagnosis not present

## 2015-09-02 DIAGNOSIS — J45909 Unspecified asthma, uncomplicated: Secondary | ICD-10-CM | POA: Diagnosis not present

## 2015-09-02 DIAGNOSIS — E785 Hyperlipidemia, unspecified: Secondary | ICD-10-CM | POA: Diagnosis not present

## 2015-09-02 DIAGNOSIS — I5033 Acute on chronic diastolic (congestive) heart failure: Secondary | ICD-10-CM | POA: Diagnosis not present

## 2015-09-02 DIAGNOSIS — M793 Panniculitis, unspecified: Secondary | ICD-10-CM | POA: Diagnosis not present

## 2015-09-04 DIAGNOSIS — M793 Panniculitis, unspecified: Secondary | ICD-10-CM | POA: Diagnosis not present

## 2015-09-04 DIAGNOSIS — M109 Gout, unspecified: Secondary | ICD-10-CM | POA: Diagnosis not present

## 2015-09-04 DIAGNOSIS — J45909 Unspecified asthma, uncomplicated: Secondary | ICD-10-CM | POA: Diagnosis not present

## 2015-09-04 DIAGNOSIS — Z794 Long term (current) use of insulin: Secondary | ICD-10-CM | POA: Diagnosis not present

## 2015-09-04 DIAGNOSIS — I110 Hypertensive heart disease with heart failure: Secondary | ICD-10-CM | POA: Diagnosis not present

## 2015-09-04 DIAGNOSIS — E785 Hyperlipidemia, unspecified: Secondary | ICD-10-CM | POA: Diagnosis not present

## 2015-09-04 DIAGNOSIS — E119 Type 2 diabetes mellitus without complications: Secondary | ICD-10-CM | POA: Diagnosis not present

## 2015-09-04 DIAGNOSIS — I5033 Acute on chronic diastolic (congestive) heart failure: Secondary | ICD-10-CM | POA: Diagnosis not present

## 2015-09-06 DIAGNOSIS — M793 Panniculitis, unspecified: Secondary | ICD-10-CM | POA: Diagnosis not present

## 2015-09-06 DIAGNOSIS — Z794 Long term (current) use of insulin: Secondary | ICD-10-CM | POA: Diagnosis not present

## 2015-09-06 DIAGNOSIS — E785 Hyperlipidemia, unspecified: Secondary | ICD-10-CM | POA: Diagnosis not present

## 2015-09-06 DIAGNOSIS — I5033 Acute on chronic diastolic (congestive) heart failure: Secondary | ICD-10-CM | POA: Diagnosis not present

## 2015-09-06 DIAGNOSIS — I110 Hypertensive heart disease with heart failure: Secondary | ICD-10-CM | POA: Diagnosis not present

## 2015-09-06 DIAGNOSIS — M109 Gout, unspecified: Secondary | ICD-10-CM | POA: Diagnosis not present

## 2015-09-06 DIAGNOSIS — J45909 Unspecified asthma, uncomplicated: Secondary | ICD-10-CM | POA: Diagnosis not present

## 2015-09-06 DIAGNOSIS — E119 Type 2 diabetes mellitus without complications: Secondary | ICD-10-CM | POA: Diagnosis not present

## 2015-09-07 DIAGNOSIS — E785 Hyperlipidemia, unspecified: Secondary | ICD-10-CM | POA: Diagnosis not present

## 2015-09-07 DIAGNOSIS — I110 Hypertensive heart disease with heart failure: Secondary | ICD-10-CM | POA: Diagnosis not present

## 2015-09-07 DIAGNOSIS — I5033 Acute on chronic diastolic (congestive) heart failure: Secondary | ICD-10-CM | POA: Diagnosis not present

## 2015-09-07 DIAGNOSIS — Z794 Long term (current) use of insulin: Secondary | ICD-10-CM | POA: Diagnosis not present

## 2015-09-07 DIAGNOSIS — M109 Gout, unspecified: Secondary | ICD-10-CM | POA: Diagnosis not present

## 2015-09-07 DIAGNOSIS — E119 Type 2 diabetes mellitus without complications: Secondary | ICD-10-CM | POA: Diagnosis not present

## 2015-09-07 DIAGNOSIS — J45909 Unspecified asthma, uncomplicated: Secondary | ICD-10-CM | POA: Diagnosis not present

## 2015-09-07 DIAGNOSIS — M793 Panniculitis, unspecified: Secondary | ICD-10-CM | POA: Diagnosis not present

## 2015-09-08 DIAGNOSIS — I429 Cardiomyopathy, unspecified: Secondary | ICD-10-CM | POA: Diagnosis not present

## 2015-09-08 DIAGNOSIS — I119 Hypertensive heart disease without heart failure: Secondary | ICD-10-CM | POA: Diagnosis not present

## 2015-09-08 DIAGNOSIS — J45909 Unspecified asthma, uncomplicated: Secondary | ICD-10-CM | POA: Diagnosis not present

## 2015-09-08 DIAGNOSIS — E785 Hyperlipidemia, unspecified: Secondary | ICD-10-CM | POA: Diagnosis not present

## 2015-09-08 DIAGNOSIS — G4700 Insomnia, unspecified: Secondary | ICD-10-CM | POA: Diagnosis not present

## 2015-09-08 DIAGNOSIS — E119 Type 2 diabetes mellitus without complications: Secondary | ICD-10-CM | POA: Diagnosis not present

## 2015-09-08 DIAGNOSIS — G4733 Obstructive sleep apnea (adult) (pediatric): Secondary | ICD-10-CM | POA: Diagnosis not present

## 2015-09-08 DIAGNOSIS — I10 Essential (primary) hypertension: Secondary | ICD-10-CM | POA: Diagnosis not present

## 2015-09-09 DIAGNOSIS — M109 Gout, unspecified: Secondary | ICD-10-CM | POA: Diagnosis not present

## 2015-09-09 DIAGNOSIS — M793 Panniculitis, unspecified: Secondary | ICD-10-CM | POA: Diagnosis not present

## 2015-09-09 DIAGNOSIS — E119 Type 2 diabetes mellitus without complications: Secondary | ICD-10-CM | POA: Diagnosis not present

## 2015-09-09 DIAGNOSIS — Z794 Long term (current) use of insulin: Secondary | ICD-10-CM | POA: Diagnosis not present

## 2015-09-09 DIAGNOSIS — E785 Hyperlipidemia, unspecified: Secondary | ICD-10-CM | POA: Diagnosis not present

## 2015-09-09 DIAGNOSIS — J45909 Unspecified asthma, uncomplicated: Secondary | ICD-10-CM | POA: Diagnosis not present

## 2015-09-09 DIAGNOSIS — I5033 Acute on chronic diastolic (congestive) heart failure: Secondary | ICD-10-CM | POA: Diagnosis not present

## 2015-09-09 DIAGNOSIS — I110 Hypertensive heart disease with heart failure: Secondary | ICD-10-CM | POA: Diagnosis not present

## 2015-09-10 DIAGNOSIS — I5033 Acute on chronic diastolic (congestive) heart failure: Secondary | ICD-10-CM | POA: Diagnosis not present

## 2015-09-10 DIAGNOSIS — M109 Gout, unspecified: Secondary | ICD-10-CM | POA: Diagnosis not present

## 2015-09-10 DIAGNOSIS — Z794 Long term (current) use of insulin: Secondary | ICD-10-CM | POA: Diagnosis not present

## 2015-09-10 DIAGNOSIS — J45909 Unspecified asthma, uncomplicated: Secondary | ICD-10-CM | POA: Diagnosis not present

## 2015-09-10 DIAGNOSIS — E785 Hyperlipidemia, unspecified: Secondary | ICD-10-CM | POA: Diagnosis not present

## 2015-09-10 DIAGNOSIS — M793 Panniculitis, unspecified: Secondary | ICD-10-CM | POA: Diagnosis not present

## 2015-09-10 DIAGNOSIS — I110 Hypertensive heart disease with heart failure: Secondary | ICD-10-CM | POA: Diagnosis not present

## 2015-09-10 DIAGNOSIS — E119 Type 2 diabetes mellitus without complications: Secondary | ICD-10-CM | POA: Diagnosis not present

## 2015-09-13 DIAGNOSIS — J449 Chronic obstructive pulmonary disease, unspecified: Secondary | ICD-10-CM | POA: Diagnosis not present

## 2015-09-13 DIAGNOSIS — E785 Hyperlipidemia, unspecified: Secondary | ICD-10-CM | POA: Diagnosis not present

## 2015-09-13 DIAGNOSIS — I5033 Acute on chronic diastolic (congestive) heart failure: Secondary | ICD-10-CM | POA: Diagnosis not present

## 2015-09-13 DIAGNOSIS — M793 Panniculitis, unspecified: Secondary | ICD-10-CM | POA: Diagnosis not present

## 2015-09-13 DIAGNOSIS — J45909 Unspecified asthma, uncomplicated: Secondary | ICD-10-CM | POA: Diagnosis not present

## 2015-09-13 DIAGNOSIS — I110 Hypertensive heart disease with heart failure: Secondary | ICD-10-CM | POA: Diagnosis not present

## 2015-09-13 DIAGNOSIS — M109 Gout, unspecified: Secondary | ICD-10-CM | POA: Diagnosis not present

## 2015-09-13 DIAGNOSIS — Z794 Long term (current) use of insulin: Secondary | ICD-10-CM | POA: Diagnosis not present

## 2015-09-13 DIAGNOSIS — E119 Type 2 diabetes mellitus without complications: Secondary | ICD-10-CM | POA: Diagnosis not present

## 2015-09-14 ENCOUNTER — Encounter: Payer: Self-pay | Admitting: Cardiology

## 2015-09-14 DIAGNOSIS — I110 Hypertensive heart disease with heart failure: Secondary | ICD-10-CM | POA: Diagnosis not present

## 2015-09-14 DIAGNOSIS — E785 Hyperlipidemia, unspecified: Secondary | ICD-10-CM | POA: Diagnosis not present

## 2015-09-14 DIAGNOSIS — M793 Panniculitis, unspecified: Secondary | ICD-10-CM | POA: Diagnosis not present

## 2015-09-14 DIAGNOSIS — Z794 Long term (current) use of insulin: Secondary | ICD-10-CM | POA: Diagnosis not present

## 2015-09-14 DIAGNOSIS — I5033 Acute on chronic diastolic (congestive) heart failure: Secondary | ICD-10-CM | POA: Diagnosis not present

## 2015-09-14 DIAGNOSIS — J45909 Unspecified asthma, uncomplicated: Secondary | ICD-10-CM | POA: Diagnosis not present

## 2015-09-14 DIAGNOSIS — E119 Type 2 diabetes mellitus without complications: Secondary | ICD-10-CM | POA: Diagnosis not present

## 2015-09-14 DIAGNOSIS — M109 Gout, unspecified: Secondary | ICD-10-CM | POA: Diagnosis not present

## 2015-09-15 DIAGNOSIS — R0602 Shortness of breath: Secondary | ICD-10-CM | POA: Diagnosis not present

## 2015-09-15 DIAGNOSIS — I2781 Cor pulmonale (chronic): Secondary | ICD-10-CM | POA: Diagnosis not present

## 2015-09-15 DIAGNOSIS — Z23 Encounter for immunization: Secondary | ICD-10-CM | POA: Diagnosis not present

## 2015-09-15 DIAGNOSIS — G4730 Sleep apnea, unspecified: Secondary | ICD-10-CM | POA: Diagnosis not present

## 2015-09-16 ENCOUNTER — Encounter: Payer: Self-pay | Admitting: Cardiology

## 2015-09-16 ENCOUNTER — Ambulatory Visit (INDEPENDENT_AMBULATORY_CARE_PROVIDER_SITE_OTHER): Payer: Commercial Managed Care - HMO | Admitting: Cardiology

## 2015-09-16 VITALS — BP 118/70 | HR 64 | Ht 63.5 in | Wt 321.5 lb

## 2015-09-16 DIAGNOSIS — R0902 Hypoxemia: Secondary | ICD-10-CM

## 2015-09-16 DIAGNOSIS — I110 Hypertensive heart disease with heart failure: Secondary | ICD-10-CM

## 2015-09-16 DIAGNOSIS — I5032 Chronic diastolic (congestive) heart failure: Secondary | ICD-10-CM | POA: Diagnosis not present

## 2015-09-16 NOTE — Progress Notes (Signed)
Cardiology Office Note    Date:  09/16/2015   ID:  Morgan Fischer, DOB 09/10/1950, MRN [id number]  PCP:  Morgan Plum, MD  Cardiologist:   Morgan Schultz, MD   No chief complaint on file.   History of Present Illness:  Morgan Fischer is a 65 y.o. female with Morbid obesity, DM, HTN, HL, on home O2 since 2004 here for evaluation of recent acute on chronic diastolic HF (EF 70%, severe LVH, possible mild AS) associated with hospitalization on 08/28/15 for abdominal wall panniculitis (VANC, ZOSYN).   Was given IV lasix. Bb ARB, fluid and salt restriction.   ECG showed T-wave inversion in lateral leads and V4-V5.BNP 395.9Likely related to her severe LVH.   She is not had any sudden cardiac death in her family, she has had no one explain syncope. She has battled with weight for many years.  She was concerned about this diagnosis of heart failure.  Also, echo did not visualize the aortic valve very well but did show a mean gradient of 20 mmHg and was predicted to have at least mild aortic stenosis.  Currently she is at baseline, no significant shortness of breath. Wearing home oxygen. No chest pain, no syncope, no orthopnea. Her abdominal wall infection has improved.    Past Medical History:  Diagnosis Date  . Arthritis   . Asthma   . Diabetes mellitus without complication (HCC)   . High cholesterol   . Hypertension   . Morbid obesity (HCC)     Past Surgical History:  Procedure Laterality Date  . ABDOMINAL HYSTERECTOMY    . CESAREAN SECTION      Current Medications: Outpatient Medications Prior to Visit  Medication Sig Dispense Refill  . ACCU-CHEK AVIVA PLUS test strip Inject 1 applicator into the skin as needed.    Marland Kitchen allopurinol (ZYLOPRIM) 300 MG tablet Take 300 mg by mouth daily.    Marland Kitchen amLODipine (NORVASC) 10 MG tablet Take 10 mg by mouth daily.    Marland Kitchen aspirin 325 MG EC tablet Take 325 mg by mouth daily.    Marland Kitchen atenolol (TENORMIN) 100 MG tablet Take 1 tablet (100 mg total) by  mouth daily. 30 tablet 0  . cholecalciferol (VITAMIN D) 1000 UNITS tablet Take 1,000 Units by mouth daily.    . Dulaglutide (TRULICITY [place]) Inject 1 application into the skin once a week.     . furosemide (LASIX) 40 MG tablet Take 1 tablet (40 mg total) by mouth 2 (two) times daily. 60 tablet 0  . glimepiride (AMARYL) 4 MG tablet Take 4 mg by mouth 2 (two) times daily.    Marland Kitchen ibuprofen (ADVIL,MOTRIN) 600 MG tablet Take 1 tablet (600 mg total) by mouth every 6 (six) hours as needed. 40 tablet 0  . insulin glargine (LANTUS) 100 UNIT/ML injection Inject 30 Units into the skin every evening.    Marland Kitchen LANTUS SOLOSTAR 100 UNIT/ML Solostar Pen Inject 40 Units into the skin every morning. 40 QAM and 30 every evening    . losartan (COZAAR) 50 MG tablet Take 50 mg by mouth daily.    . metFORMIN (GLUCOPHAGE) 1000 MG tablet Take 1,000 mg by mouth 2 (two) times daily with a meal.    . montelukast (SINGULAIR) 10 MG tablet Take 10 mg by mouth at bedtime.    . nitroGLYCERIN (NITROSTAT) 0.4 MG SL tablet Place 1 tablet (0.4 mg total) under the tongue every 5 (five) minutes as needed for chest pain. 30 tablet 0  . potassium chloride SA (  K-DUR,KLOR-CON) 20 MEQ tablet Take 2 tablets (40 mEq total) by mouth 2 (two) times daily. 60 tablet 0  . amoxicillin-clavulanate (AUGMENTIN) 875-125 MG tablet Take 1 tablet by mouth every 12 (twelve) hours. 14 tablet 0  . cyclobenzaprine (FLEXERIL) 5 MG tablet Take 1 tablet (5 mg total) by mouth 3 (three) times daily as needed for muscle spasms. (Patient not taking: Reported on 08/24/2015) 15 tablet 0  . sulfamethoxazole-trimethoprim (BACTRIM DS,SEPTRA DS) 800-160 MG tablet Take 1 tablet by mouth every 12 (twelve) hours. 14 tablet 0   No facility-administered medications prior to visit.      Allergies:   Ciprocinonide [fluocinolone]; Catapres [clonidine hcl]; and Demadex [torsemide]   Social History   Social History  . Marital status: Married    Spouse name: N/A  . Number of  children: N/A  . Years of education: N/A   Social History Main Topics  . Smoking status: Never Smoker  . Smokeless tobacco: Never Used  . Alcohol use No  . Drug use: Unknown  . Sexual activity: Not Asked   Other Topics Concern  . None   Social History Narrative  . None     Family History:  The patient's family history includes Breast cancer in her sister; CAD in her mother; Diabetes Mellitus I in her mother; Heart disease in her father.   ROS:   Please see the history of present illness.    ROS All other systems reviewed and are negative.   PHYSICAL EXAM:   VS:  BP 118/70   Pulse 64   Ht 5' 3.5" (1.613 m)   Wt (!) 321 lb 8 oz (145.8 kg)   BMI 56.06 kg/m    GEN: Well nourished, well developed, in no acute distress  HEENT: normal nasal cannula oxygen Neck: no JVD, carotid bruits, or masses Cardiac: RRR; 3/6 systolic outflow murmur, no rubs, or gallops, chronic lower extremity edema  Respiratory:  clear to auscultation bilaterally, normal work of breathing GI: soft, nontender, nondistended, + BS, obese MS: no deformity or atrophy  Skin: warm and dry, no rash Neuro:  Alert and Oriented x 3, Strength and sensation are intact Psych: euthymic mood, full affect  Wt Readings from Last 3 Encounters:  09/16/15 (!) 321 lb 8 oz (145.8 kg)  08/27/15 263 lb 3.7 oz (119.4 kg)  07/05/13 (!) 350 lb (158.8 kg)      Studies/Labs Reviewed:   EKG:  08/23/68-sinus rhythm, T-wave inversion noted, LVH personally viewed  Recent Labs: 08/23/2015: B Natriuretic Peptide 395.9; Hemoglobin 11.5; Platelets 120 08/25/2015: Magnesium 2.0 08/26/2015: BUN 19; Creatinine, Ser 0.78; Potassium 3.7; Sodium 142   Lipid Panel    Component Value Date/Time   CHOL 181 08/24/2015 0422   TRIG 100 08/24/2015 0422   HDL 43 08/24/2015 0422   CHOLHDL 4.2 08/24/2015 0422   VLDL 20 08/24/2015 0422   LDLCALC 118 (H) 08/24/2015 0422    Additional studies/ records that were reviewed today include:    Hospital records, echocardiogram, lab work reviewed    ASSESSMENT:    1. Chronic diastolic heart failure (HCC)   2. Morbid obesity due to excess calories (HCC)   3. Hypoxia   4. Hypertensive heart disease with heart failure (HCC)      PLAN:  In order of problems listed above:  Chronic diastolic heart failure  - Maintaining balance with Lasix twice a day 40 mg  - Watching her salt and fluids.  - We discussed the physiology behind her echocardiogram, diastolic  dysfunction, severe LVH, hyperdynamic EF.   Aortic stenosis  - Possible at least mild aortic stenosis noted on echocardiogram. Valve was difficult to visualize likely because of body habitus.  - Mean gradient was 20 mmHg  - No family members with valvular abnormalities. Her mother had bypass surgery at age 67.  - At this point, I will likely repeat echocardiogram in 6-12 months trying to visualize the aortic valve more clearly. I do not think that we need to proceed with transesophageal echocardiogram at this time.  - Also, increased velocity/gradient may be in part secondary to her hyperdynamic left ventricular function.  Morbid obesity  - Continue to encourage weight loss  - This certainly will help with all aspects of her health  Diabetes  - Insulin  - Per primary team  Hypertensive heart disease with heart failure  - Continue beta blocker, atenolol, amlodipine, furosemide  Home oxygen  - Has been on for several years, since 2004.    Medication Adjustments/Labs and Tests Ordered: Current medicines are reviewed at length with the patient today.  Concerns regarding medicines are outlined above.  Medication changes, Labs and Tests ordered today are listed in the Patient Instructions below. Patient Instructions  Medication Instructions:  The current medical regimen is effective;  continue present plan and medications.  Follow-Up: Follow up in 6 months with Dr. Anne FuSkains.  You will receive a letter in the mail 2  months before you are due.  Please call us when you receive this letter to schedule your follow up appointment.  If you need a refill on your cardiac medications before your next appointment, please call your pharmacy.  Thank you for choosing [facility]!!        Signed, Morgan SchultzMark Skains, MD  09/16/2015 12:12 PM    [facility] [address], [place], [place]  [address] Phone: [phone]; Fax: [phone]

## 2015-09-16 NOTE — Patient Instructions (Signed)

## 2015-09-17 DIAGNOSIS — I110 Hypertensive heart disease with heart failure: Secondary | ICD-10-CM | POA: Diagnosis not present

## 2015-09-17 DIAGNOSIS — M793 Panniculitis, unspecified: Secondary | ICD-10-CM | POA: Diagnosis not present

## 2015-09-17 DIAGNOSIS — Z794 Long term (current) use of insulin: Secondary | ICD-10-CM | POA: Diagnosis not present

## 2015-09-17 DIAGNOSIS — J45909 Unspecified asthma, uncomplicated: Secondary | ICD-10-CM | POA: Diagnosis not present

## 2015-09-17 DIAGNOSIS — I5033 Acute on chronic diastolic (congestive) heart failure: Secondary | ICD-10-CM | POA: Diagnosis not present

## 2015-09-17 DIAGNOSIS — E119 Type 2 diabetes mellitus without complications: Secondary | ICD-10-CM | POA: Diagnosis not present

## 2015-09-17 DIAGNOSIS — M109 Gout, unspecified: Secondary | ICD-10-CM | POA: Diagnosis not present

## 2015-09-17 DIAGNOSIS — E785 Hyperlipidemia, unspecified: Secondary | ICD-10-CM | POA: Diagnosis not present

## 2015-09-21 DIAGNOSIS — E119 Type 2 diabetes mellitus without complications: Secondary | ICD-10-CM | POA: Diagnosis not present

## 2015-09-21 DIAGNOSIS — E785 Hyperlipidemia, unspecified: Secondary | ICD-10-CM | POA: Diagnosis not present

## 2015-09-21 DIAGNOSIS — M793 Panniculitis, unspecified: Secondary | ICD-10-CM | POA: Diagnosis not present

## 2015-09-21 DIAGNOSIS — I5033 Acute on chronic diastolic (congestive) heart failure: Secondary | ICD-10-CM | POA: Diagnosis not present

## 2015-09-21 DIAGNOSIS — M109 Gout, unspecified: Secondary | ICD-10-CM | POA: Diagnosis not present

## 2015-09-21 DIAGNOSIS — J45909 Unspecified asthma, uncomplicated: Secondary | ICD-10-CM | POA: Diagnosis not present

## 2015-09-21 DIAGNOSIS — Z794 Long term (current) use of insulin: Secondary | ICD-10-CM | POA: Diagnosis not present

## 2015-09-21 DIAGNOSIS — I110 Hypertensive heart disease with heart failure: Secondary | ICD-10-CM | POA: Diagnosis not present

## 2015-09-22 DIAGNOSIS — M109 Gout, unspecified: Secondary | ICD-10-CM | POA: Diagnosis not present

## 2015-09-22 DIAGNOSIS — Z794 Long term (current) use of insulin: Secondary | ICD-10-CM | POA: Diagnosis not present

## 2015-09-22 DIAGNOSIS — E785 Hyperlipidemia, unspecified: Secondary | ICD-10-CM | POA: Diagnosis not present

## 2015-09-22 DIAGNOSIS — E119 Type 2 diabetes mellitus without complications: Secondary | ICD-10-CM | POA: Diagnosis not present

## 2015-09-22 DIAGNOSIS — J45909 Unspecified asthma, uncomplicated: Secondary | ICD-10-CM | POA: Diagnosis not present

## 2015-09-22 DIAGNOSIS — I5033 Acute on chronic diastolic (congestive) heart failure: Secondary | ICD-10-CM | POA: Diagnosis not present

## 2015-09-22 DIAGNOSIS — M793 Panniculitis, unspecified: Secondary | ICD-10-CM | POA: Diagnosis not present

## 2015-09-22 DIAGNOSIS — I110 Hypertensive heart disease with heart failure: Secondary | ICD-10-CM | POA: Diagnosis not present

## 2015-09-23 DIAGNOSIS — M79672 Pain in left foot: Secondary | ICD-10-CM | POA: Diagnosis not present

## 2015-09-23 DIAGNOSIS — M79671 Pain in right foot: Secondary | ICD-10-CM | POA: Diagnosis not present

## 2015-09-23 DIAGNOSIS — J45909 Unspecified asthma, uncomplicated: Secondary | ICD-10-CM | POA: Diagnosis not present

## 2015-09-23 DIAGNOSIS — J449 Chronic obstructive pulmonary disease, unspecified: Secondary | ICD-10-CM | POA: Diagnosis not present

## 2015-09-23 DIAGNOSIS — E1159 Type 2 diabetes mellitus with other circulatory complications: Secondary | ICD-10-CM | POA: Diagnosis not present

## 2015-09-23 DIAGNOSIS — L84 Corns and callosities: Secondary | ICD-10-CM | POA: Diagnosis not present

## 2015-09-23 DIAGNOSIS — B351 Tinea unguium: Secondary | ICD-10-CM | POA: Diagnosis not present

## 2015-09-24 DIAGNOSIS — Z794 Long term (current) use of insulin: Secondary | ICD-10-CM | POA: Diagnosis not present

## 2015-09-24 DIAGNOSIS — M793 Panniculitis, unspecified: Secondary | ICD-10-CM | POA: Diagnosis not present

## 2015-09-24 DIAGNOSIS — I5033 Acute on chronic diastolic (congestive) heart failure: Secondary | ICD-10-CM | POA: Diagnosis not present

## 2015-09-24 DIAGNOSIS — E119 Type 2 diabetes mellitus without complications: Secondary | ICD-10-CM | POA: Diagnosis not present

## 2015-09-24 DIAGNOSIS — E785 Hyperlipidemia, unspecified: Secondary | ICD-10-CM | POA: Diagnosis not present

## 2015-09-24 DIAGNOSIS — M109 Gout, unspecified: Secondary | ICD-10-CM | POA: Diagnosis not present

## 2015-09-24 DIAGNOSIS — I110 Hypertensive heart disease with heart failure: Secondary | ICD-10-CM | POA: Diagnosis not present

## 2015-09-24 DIAGNOSIS — J45909 Unspecified asthma, uncomplicated: Secondary | ICD-10-CM | POA: Diagnosis not present

## 2015-09-28 DIAGNOSIS — E119 Type 2 diabetes mellitus without complications: Secondary | ICD-10-CM | POA: Diagnosis not present

## 2015-09-28 DIAGNOSIS — E785 Hyperlipidemia, unspecified: Secondary | ICD-10-CM | POA: Diagnosis not present

## 2015-09-28 DIAGNOSIS — M793 Panniculitis, unspecified: Secondary | ICD-10-CM | POA: Diagnosis not present

## 2015-09-28 DIAGNOSIS — J45909 Unspecified asthma, uncomplicated: Secondary | ICD-10-CM | POA: Diagnosis not present

## 2015-09-28 DIAGNOSIS — Z794 Long term (current) use of insulin: Secondary | ICD-10-CM | POA: Diagnosis not present

## 2015-09-28 DIAGNOSIS — I5033 Acute on chronic diastolic (congestive) heart failure: Secondary | ICD-10-CM | POA: Diagnosis not present

## 2015-09-28 DIAGNOSIS — M109 Gout, unspecified: Secondary | ICD-10-CM | POA: Diagnosis not present

## 2015-09-28 DIAGNOSIS — I110 Hypertensive heart disease with heart failure: Secondary | ICD-10-CM | POA: Diagnosis not present

## 2015-09-29 ENCOUNTER — Other Ambulatory Visit: Payer: Self-pay

## 2015-09-30 DIAGNOSIS — I110 Hypertensive heart disease with heart failure: Secondary | ICD-10-CM | POA: Diagnosis not present

## 2015-09-30 DIAGNOSIS — R269 Unspecified abnormalities of gait and mobility: Secondary | ICD-10-CM | POA: Diagnosis not present

## 2015-09-30 DIAGNOSIS — I5033 Acute on chronic diastolic (congestive) heart failure: Secondary | ICD-10-CM | POA: Diagnosis not present

## 2015-09-30 DIAGNOSIS — E119 Type 2 diabetes mellitus without complications: Secondary | ICD-10-CM | POA: Diagnosis not present

## 2015-09-30 DIAGNOSIS — M109 Gout, unspecified: Secondary | ICD-10-CM | POA: Diagnosis not present

## 2015-09-30 DIAGNOSIS — J449 Chronic obstructive pulmonary disease, unspecified: Secondary | ICD-10-CM | POA: Diagnosis not present

## 2015-09-30 DIAGNOSIS — M356 Relapsing panniculitis [Weber-Christian]: Secondary | ICD-10-CM | POA: Diagnosis not present

## 2015-09-30 DIAGNOSIS — E785 Hyperlipidemia, unspecified: Secondary | ICD-10-CM | POA: Diagnosis not present

## 2015-09-30 DIAGNOSIS — J45909 Unspecified asthma, uncomplicated: Secondary | ICD-10-CM | POA: Diagnosis not present

## 2015-09-30 DIAGNOSIS — Z794 Long term (current) use of insulin: Secondary | ICD-10-CM | POA: Diagnosis not present

## 2015-09-30 DIAGNOSIS — M793 Panniculitis, unspecified: Secondary | ICD-10-CM | POA: Diagnosis not present

## 2015-09-30 NOTE — Patient Outreach (Signed)
[facility]) Care Management  09/30/2015  Morgan BeachJulia Daugherty 02/24/1950 [id number]   REFERRAL DATE: 09/28/15  REFERRAL SOURCE: [facility] REFERRAL REASON: Disease management diabetes/ congestive heart failure CONSENT:  Self/ patient  PROVIDERS:  Dr. Greggory StallionGeorge Osei-Bonsu Dr. Dione HousekeeperSkaines  SOCIAL: Patient lives with spouse Patient is able to drive to appointments   SUBJECTIVE: Telephone call to patient regarding [facility] referral.  HIPAA verified with patient.  [facility]. Patient verbally agreed to services.  Patient reports she was in the hospital in August 2017 for panniculitis.  Patient states while in the hospital she was told she had some fluid around her heart. Patient states she saw Dr. Dione HousekeeperSkaines, cardiologist on 09/16/15. Patient states she was never officially told in the past she had congestive heart failure. Patient states Dr. Dione HousekeeperSkaines confirmed with her she has congestive heart failure. Patient states she currently has home health services providing nursing and physical therapy 1 time per week. Patient states she has 1 more week with physical therapy. Patient states she has had home oxygen since 2004.  Patient states she has asthma and COPD. Patient states she is weighing herself daily and recording her weights. Patient states she is having a hard time adhering to her fluid restrictions.  Patient states she finds herself being very thirsty a lot.   DIABETES: Patient states she is very concerned about her diabetes. Patient reports her most recent A1c was 10.2.  Patient states she checks her bloods sugars approximately 2-3 times per day.  Patient states due to not being able to afford her insulin the doctor switched her to lantas and trulicity.  Patient states she has been receiving samples from her primary MD.  Patient states since she has been out of the hospital she has been having a lot of lows with her blood sugar.  Patient reports she has  awaken around 3:00-4:00am in the morning with blood sugars in the 40's.  Patient states she has been eating a snack before bed hoping that this would help.  Patient states she has also not taking her lantas at night a few times hoping this would stop the blood sugar drops. Patient states she is very concerned about this because she knows this is very dangerous. Patient states she has seen her primary MD approximately 3 weeks ago.  Patient states her diabetes is being treated the same way now as it was before she went in the hospital.  Patient states she is concerned because she is not eating the same way she was prior to going in the hospital and feels like she may need medication adjustment.  Patient states she is also concerned about her diet. Patient states she is eating much differently now than she was before she was in the hospital.  Patient states she is unsure of what to eat and really needs some guidelines.Patient states she is suppose to be on a heart healthy, low sodium, low carb load, fluid restricted diet.  Patient states she has an appointment with a nutritionist on 10/04/15. Patient reports her follow up appointment with her primary MD is on 10/23/15  MEDICATIONS: Patient states she ended up in the coverage gap very early this year. Patient states her doctor had to change her insulin medication due to her not being able to afford it. Patient states she is currently on Trulicity and lantas. Patient states she is receiving samples from her primary MD and will probably have to continue to receive them until the  end of the year. Patient concerned this medication is dropping her blood sugars to low.  ASSESSMENT: 65 y.o.femalewith medical history significant ofhypertension, hyperlipidemia, diabetes mellitus, asthma, on home oxygen, gout, Acute on Chronic Diastolic CHF, morbid obesity.  Status post hospitalization 08/23/15 - 08/28/15 for panniculitis.  Patient will benefit from referral to health  coach for diabetes and congestive heart failure education.  Patient will benefit from referral to pharmacist for medication assistance and management.   PLAN:  RNCM will refer patient to health coach or diabetes and congestive heart failure education RNCM will refer patient to pharmacy for medication assistance and management.   George Ina RN,BSN,CCM [facility] Telephonic  [phone]

## 2015-10-01 DIAGNOSIS — I10 Essential (primary) hypertension: Secondary | ICD-10-CM | POA: Diagnosis not present

## 2015-10-01 DIAGNOSIS — I349 Nonrheumatic mitral valve disorder, unspecified: Secondary | ICD-10-CM | POA: Diagnosis not present

## 2015-10-04 DIAGNOSIS — E118 Type 2 diabetes mellitus with unspecified complications: Secondary | ICD-10-CM | POA: Diagnosis not present

## 2015-10-05 DIAGNOSIS — J45909 Unspecified asthma, uncomplicated: Secondary | ICD-10-CM | POA: Diagnosis not present

## 2015-10-05 DIAGNOSIS — I429 Cardiomyopathy, unspecified: Secondary | ICD-10-CM | POA: Diagnosis not present

## 2015-10-05 DIAGNOSIS — E119 Type 2 diabetes mellitus without complications: Secondary | ICD-10-CM | POA: Diagnosis not present

## 2015-10-05 DIAGNOSIS — I119 Hypertensive heart disease without heart failure: Secondary | ICD-10-CM | POA: Diagnosis not present

## 2015-10-05 DIAGNOSIS — G4733 Obstructive sleep apnea (adult) (pediatric): Secondary | ICD-10-CM | POA: Diagnosis not present

## 2015-10-05 DIAGNOSIS — I10 Essential (primary) hypertension: Secondary | ICD-10-CM | POA: Diagnosis not present

## 2015-10-05 DIAGNOSIS — G4700 Insomnia, unspecified: Secondary | ICD-10-CM | POA: Diagnosis not present

## 2015-10-05 DIAGNOSIS — E785 Hyperlipidemia, unspecified: Secondary | ICD-10-CM | POA: Diagnosis not present

## 2015-10-06 DIAGNOSIS — I110 Hypertensive heart disease with heart failure: Secondary | ICD-10-CM | POA: Diagnosis not present

## 2015-10-06 DIAGNOSIS — J45909 Unspecified asthma, uncomplicated: Secondary | ICD-10-CM | POA: Diagnosis not present

## 2015-10-06 DIAGNOSIS — I5033 Acute on chronic diastolic (congestive) heart failure: Secondary | ICD-10-CM | POA: Diagnosis not present

## 2015-10-06 DIAGNOSIS — Z794 Long term (current) use of insulin: Secondary | ICD-10-CM | POA: Diagnosis not present

## 2015-10-06 DIAGNOSIS — E785 Hyperlipidemia, unspecified: Secondary | ICD-10-CM | POA: Diagnosis not present

## 2015-10-06 DIAGNOSIS — M793 Panniculitis, unspecified: Secondary | ICD-10-CM | POA: Diagnosis not present

## 2015-10-06 DIAGNOSIS — E119 Type 2 diabetes mellitus without complications: Secondary | ICD-10-CM | POA: Diagnosis not present

## 2015-10-06 DIAGNOSIS — M109 Gout, unspecified: Secondary | ICD-10-CM | POA: Diagnosis not present

## 2015-10-07 DIAGNOSIS — Z794 Long term (current) use of insulin: Secondary | ICD-10-CM | POA: Diagnosis not present

## 2015-10-07 DIAGNOSIS — M793 Panniculitis, unspecified: Secondary | ICD-10-CM | POA: Diagnosis not present

## 2015-10-07 DIAGNOSIS — J45909 Unspecified asthma, uncomplicated: Secondary | ICD-10-CM | POA: Diagnosis not present

## 2015-10-07 DIAGNOSIS — M109 Gout, unspecified: Secondary | ICD-10-CM | POA: Diagnosis not present

## 2015-10-07 DIAGNOSIS — E785 Hyperlipidemia, unspecified: Secondary | ICD-10-CM | POA: Diagnosis not present

## 2015-10-07 DIAGNOSIS — E119 Type 2 diabetes mellitus without complications: Secondary | ICD-10-CM | POA: Diagnosis not present

## 2015-10-07 DIAGNOSIS — I5033 Acute on chronic diastolic (congestive) heart failure: Secondary | ICD-10-CM | POA: Diagnosis not present

## 2015-10-07 DIAGNOSIS — I110 Hypertensive heart disease with heart failure: Secondary | ICD-10-CM | POA: Diagnosis not present

## 2015-10-08 ENCOUNTER — Other Ambulatory Visit: Payer: Self-pay

## 2015-10-08 NOTE — Patient Outreach (Signed)
I called Mrs. Brearley to discuss her need around assistance with Basaglar and Trulicity.  She stated she is having difficulty affording them.  She is currently getting samples from her provider.  I stated in order to qualify for the programs she would have to have spent $1100 out of pocket on her prescriptions. She stated she has not done that to date.  She stated she is only in the low hundreds.  I asked if she had applied for Extra Help and she stated she has and she did not qualify.  I stated that unfortunately there was not anything else we could do to assist with helping her get her medications especially since she was getting the rest of her medications from [facility].  I asked if she would like for me to make a home visit to review her blood glucose readings since she was having lows.  She stated her metformin was decreased to 500 mg at night and her hypoglycemic events during the nights have decreased.  She is starting to go to Diabetes classes as well.  She stated she is having home health come out now and she would prefer no one else come out to the home right now.  I stated that she will be having a health coach reach out to her to discuss her diabetes and CHF.  If she should have any pharmacy questions or need a pharmacist to complete a home visit, she could ask for one.  She stated she appreciated it.  I am going to close her case to pharmacy for now.    Steve Rattlerawn Viola Kinnick, PharmD, [employer]ACP [facility] Pharmacy Manager [phone]

## 2015-10-12 ENCOUNTER — Ambulatory Visit: Payer: Self-pay

## 2015-10-12 ENCOUNTER — Other Ambulatory Visit: Payer: Self-pay

## 2015-10-12 VITALS — Ht 63.0 in | Wt 318.2 lb

## 2015-10-12 NOTE — Patient Outreach (Signed)
[facility]) Care Management  [facility] Care Manager  10/13/2015   Morgan Fischer 01/24/1950 [id number]  Initial telephonic assessment completed 10-12-15.  Patient has concerns about diabetic management.  She has had problems with hypoglycemic episodes during the night.  She is currently working with her PCP to adjust medications.  Patient is on continuous oxygen at 2L/min, and uses BIPAP at night due to asthmatic respiratory disease.  Encounter Medications:  Outpatient Encounter Prescriptions as of 10/12/2015  Medication Sig  . ACCU-CHEK AVIVA PLUS test strip Inject 1 applicator into the skin as needed.  Marland Kitchen allopurinol (ZYLOPRIM) 300 MG tablet Take 300 mg by mouth daily.  Marland Kitchen amLODipine (NORVASC) 10 MG tablet Take 10 mg by mouth daily.  Marland Kitchen aspirin 325 MG EC tablet Take 325 mg by mouth daily.  Marland Kitchen atenolol (TENORMIN) 100 MG tablet Take 1 tablet (100 mg total) by mouth daily.  . calcium carbonate (CALCIUM 600) 600 MG TABS tablet Take 600 mg by mouth daily with breakfast.  . cholecalciferol (VITAMIN D) 1000 UNITS tablet Take 1,000 Units by mouth daily.  . cholecalciferol (VITAMIN D) 1000 units tablet Take 1,000 mg by mouth daily.  . Cyanocobalamin (VITAMIN B-12 PO) Take 1 tablet by mouth daily.  . Dulaglutide (TRULICITY Geraldine) Inject 1 application into the skin once a week.   . fenoprofen (NALFON) 600 MG TABS tablet Take 600 mg by mouth.  . Fluticasone-Salmeterol (ADVAIR) 250-50 MCG/DOSE AEPB Inhale 1 puff into the lungs 2 (two) times daily.  . furosemide (LASIX) 40 MG tablet Take 1 tablet (40 mg total) by mouth 2 (two) times daily.  . Garlic 1000 MG CAPS Take 1,000 mg by mouth daily.  Marland Kitchen glimepiride (AMARYL) 4 MG tablet Take 4 mg by mouth 2 (two) times daily.  Marland Kitchen ibuprofen (ADVIL,MOTRIN) 600 MG tablet Take 1 tablet (600 mg total) by mouth every 6 (six) hours as needed.  . insulin glargine (LANTUS) 100 UNIT/ML injection Inject 30 Units into the skin every evening.  Marland Kitchen ipratropium-albuterol  (DUONEB) 0.5-2.5 (3) MG/3ML SOLN Take 3 mLs by nebulization 2 (two) times daily.  Marland Kitchen LANTUS SOLOSTAR 100 UNIT/ML Solostar Pen Inject 40 Units into the skin every morning. 40 QAM and 30 every evening  . losartan (COZAAR) 50 MG tablet Take 50 mg by mouth daily.  . metFORMIN (GLUCOPHAGE) 1000 MG tablet Take 1,000 mg by mouth 2 (two) times daily with a meal.  . montelukast (SINGULAIR) 10 MG tablet Take 10 mg by mouth at bedtime.  . Multiple Vitamin (MULTIVITAMIN WITH MINERALS) TABS tablet Take 1 tablet by mouth daily.  . nitroGLYCERIN (NITROSTAT) 0.4 MG SL tablet Place 1 tablet (0.4 mg total) under the tongue every 5 (five) minutes as needed for chest pain.  . Omega-3 Fatty Acids (FISH OIL) 1000 MG CAPS Take 1,000 mg by mouth daily.  . potassium chloride SA (K-DUR,KLOR-CON) 20 MEQ tablet Take 2 tablets (40 mEq total) by mouth 2 (two) times daily.  . rosuvastatin (CRESTOR) 40 MG tablet Take 40 mg by mouth daily.   No facility-administered encounter medications on file as of 10/12/2015.     Functional Status:  In your present state of health, do you have any difficulty performing the following activities: 10/13/2015 10/12/2015  Hearing? N -  Vision? N -  Difficulty concentrating or making decisions? N -  Walking or climbing stairs? Y -  Dressing or bathing? N -  Doing errands, shopping? N -  Preparing Food and eating ? N -  Using the Toilet?  N -  In the past six months, have you accidently leaked urine? N -  Do you have problems with loss of bowel control? N -  Managing your Medications? N N  Managing your Finances? N -  Housekeeping or managing your Housekeeping? N -  Some recent data might be hidden    Fall/Depression Screening: PHQ 2/9 Scores 10/12/2015 09/29/2015  PHQ - 2 Score 0 0   [facility] CM Care Plan Problem One   Flowsheet Row Most Recent Value  Care Plan Problem One  Self-Management needs related to Diabetes  Role Documenting the Problem One  Health Coach  Care Plan for Problem  One  Active  [facility] Long Term Goal (31-90 days)  Patient's A1C will drop from 10.2 to 8.2 within 90 days.  [facility] Long Term Goal Start Date  10/12/15  [facility] CM Short Term Goal #1 (0-30 days)  Episodes of hypoglycemia during the night will resolve within 30 days.  [facility] CM Short Term Goal #1 Start Date  10/12/15  Interventions for Short Term Goal #1  Education provided re management of hypoglycemia and dietary guidelines.  Will mail EMMI articles on managing blood sugars and diabetic dietary guidelines.  [facility] CM Short Term Goal #2 (0-30 days)  Patient will be able to discuss 3 items to limit in her diet based on diabetic dietary guidelines within 30 days.  [facility] CM Short Term Goal #2 Start Date  10/12/15  Interventions for Short Term Goal #2  Discussed diet concerns.  Will mail EMMI.     Plan:  Patient will follow-up with PCP on diabetic medication adjustments.           Patient will attend next diabetic class on 10-25-15           RN will mail EMMI materials on diabetic self-management.           RN will follow-up within one month to finish gathering initial assessment data.  Tyler Deisonnie Melody Cirrincione, RN, MSN RN [employer] [facility] [phone] Fax [phone]

## 2015-10-13 DIAGNOSIS — Z794 Long term (current) use of insulin: Secondary | ICD-10-CM | POA: Diagnosis not present

## 2015-10-13 DIAGNOSIS — M109 Gout, unspecified: Secondary | ICD-10-CM | POA: Diagnosis not present

## 2015-10-13 DIAGNOSIS — I5033 Acute on chronic diastolic (congestive) heart failure: Secondary | ICD-10-CM | POA: Diagnosis not present

## 2015-10-13 DIAGNOSIS — J45909 Unspecified asthma, uncomplicated: Secondary | ICD-10-CM | POA: Diagnosis not present

## 2015-10-13 DIAGNOSIS — E785 Hyperlipidemia, unspecified: Secondary | ICD-10-CM | POA: Diagnosis not present

## 2015-10-13 DIAGNOSIS — E119 Type 2 diabetes mellitus without complications: Secondary | ICD-10-CM | POA: Diagnosis not present

## 2015-10-13 DIAGNOSIS — J449 Chronic obstructive pulmonary disease, unspecified: Secondary | ICD-10-CM | POA: Diagnosis not present

## 2015-10-13 DIAGNOSIS — M793 Panniculitis, unspecified: Secondary | ICD-10-CM | POA: Diagnosis not present

## 2015-10-13 DIAGNOSIS — I110 Hypertensive heart disease with heart failure: Secondary | ICD-10-CM | POA: Diagnosis not present

## 2015-10-20 DIAGNOSIS — I110 Hypertensive heart disease with heart failure: Secondary | ICD-10-CM | POA: Diagnosis not present

## 2015-10-20 DIAGNOSIS — E119 Type 2 diabetes mellitus without complications: Secondary | ICD-10-CM | POA: Diagnosis not present

## 2015-10-20 DIAGNOSIS — J45909 Unspecified asthma, uncomplicated: Secondary | ICD-10-CM | POA: Diagnosis not present

## 2015-10-20 DIAGNOSIS — Z794 Long term (current) use of insulin: Secondary | ICD-10-CM | POA: Diagnosis not present

## 2015-10-20 DIAGNOSIS — E785 Hyperlipidemia, unspecified: Secondary | ICD-10-CM | POA: Diagnosis not present

## 2015-10-20 DIAGNOSIS — M793 Panniculitis, unspecified: Secondary | ICD-10-CM | POA: Diagnosis not present

## 2015-10-20 DIAGNOSIS — I5033 Acute on chronic diastolic (congestive) heart failure: Secondary | ICD-10-CM | POA: Diagnosis not present

## 2015-10-20 DIAGNOSIS — M109 Gout, unspecified: Secondary | ICD-10-CM | POA: Diagnosis not present

## 2015-10-22 DIAGNOSIS — G4733 Obstructive sleep apnea (adult) (pediatric): Secondary | ICD-10-CM | POA: Diagnosis not present

## 2015-10-22 DIAGNOSIS — E119 Type 2 diabetes mellitus without complications: Secondary | ICD-10-CM | POA: Diagnosis not present

## 2015-10-22 DIAGNOSIS — E785 Hyperlipidemia, unspecified: Secondary | ICD-10-CM | POA: Diagnosis not present

## 2015-10-22 DIAGNOSIS — J45909 Unspecified asthma, uncomplicated: Secondary | ICD-10-CM | POA: Diagnosis not present

## 2015-10-22 DIAGNOSIS — I429 Cardiomyopathy, unspecified: Secondary | ICD-10-CM | POA: Diagnosis not present

## 2015-10-22 DIAGNOSIS — I119 Hypertensive heart disease without heart failure: Secondary | ICD-10-CM | POA: Diagnosis not present

## 2015-10-22 DIAGNOSIS — G4700 Insomnia, unspecified: Secondary | ICD-10-CM | POA: Diagnosis not present

## 2015-10-22 DIAGNOSIS — I10 Essential (primary) hypertension: Secondary | ICD-10-CM | POA: Diagnosis not present

## 2015-10-23 DIAGNOSIS — J449 Chronic obstructive pulmonary disease, unspecified: Secondary | ICD-10-CM | POA: Diagnosis not present

## 2015-10-23 DIAGNOSIS — J45909 Unspecified asthma, uncomplicated: Secondary | ICD-10-CM | POA: Diagnosis not present

## 2015-10-25 DIAGNOSIS — E118 Type 2 diabetes mellitus with unspecified complications: Secondary | ICD-10-CM | POA: Diagnosis not present

## 2015-10-27 ENCOUNTER — Other Ambulatory Visit: Payer: Self-pay

## 2015-10-27 VITALS — Ht 63.0 in | Wt 319.0 lb

## 2015-10-27 DIAGNOSIS — E1310 Other specified diabetes mellitus with ketoacidosis without coma: Secondary | ICD-10-CM

## 2015-10-27 NOTE — Patient Outreach (Signed)
[facility]) Care Management  10/27/2015  Hollie BeachJulia Pease 11/03/1950 [id number]  Telephone contact to complete initial assessment data.  Patient reports her blood sugars have stabilized since MD discontinued her evening diabetic medications.  Fasting range reported between 123-130.  She is very pleased that a recent A1C was 8.2, down from 10.2.  Patient reports she had her flu shot in September.  Plan:  Patient will continue documenting daily weights and cbgs.           Patient will incorporate new knowledge of diabetic dietary guidelines into her daily diet.           Patient will report any hypo or hyperglycemic episodes to her PCP.           RN will follow up in November.  Tyler Deisonnie Toddy Boyd, RN, MSN RN [employer] [facility] [phone] Fax [phone]

## 2015-10-28 DIAGNOSIS — E785 Hyperlipidemia, unspecified: Secondary | ICD-10-CM | POA: Diagnosis not present

## 2015-10-28 DIAGNOSIS — E119 Type 2 diabetes mellitus without complications: Secondary | ICD-10-CM | POA: Diagnosis not present

## 2015-10-28 DIAGNOSIS — J45909 Unspecified asthma, uncomplicated: Secondary | ICD-10-CM | POA: Diagnosis not present

## 2015-10-28 DIAGNOSIS — I110 Hypertensive heart disease with heart failure: Secondary | ICD-10-CM | POA: Diagnosis not present

## 2015-10-28 DIAGNOSIS — M793 Panniculitis, unspecified: Secondary | ICD-10-CM | POA: Diagnosis not present

## 2015-10-28 DIAGNOSIS — I5033 Acute on chronic diastolic (congestive) heart failure: Secondary | ICD-10-CM | POA: Diagnosis not present

## 2015-10-28 DIAGNOSIS — Z794 Long term (current) use of insulin: Secondary | ICD-10-CM | POA: Diagnosis not present

## 2015-10-28 DIAGNOSIS — M109 Gout, unspecified: Secondary | ICD-10-CM | POA: Diagnosis not present

## 2015-10-29 DIAGNOSIS — I10 Essential (primary) hypertension: Secondary | ICD-10-CM | POA: Diagnosis not present

## 2015-10-29 DIAGNOSIS — I349 Nonrheumatic mitral valve disorder, unspecified: Secondary | ICD-10-CM | POA: Diagnosis not present

## 2015-10-30 DIAGNOSIS — J45909 Unspecified asthma, uncomplicated: Secondary | ICD-10-CM | POA: Diagnosis not present

## 2015-10-30 DIAGNOSIS — J449 Chronic obstructive pulmonary disease, unspecified: Secondary | ICD-10-CM | POA: Diagnosis not present

## 2015-10-30 DIAGNOSIS — R269 Unspecified abnormalities of gait and mobility: Secondary | ICD-10-CM | POA: Diagnosis not present

## 2015-10-30 DIAGNOSIS — M356 Relapsing panniculitis [Weber-Christian]: Secondary | ICD-10-CM | POA: Diagnosis not present

## 2015-11-13 DIAGNOSIS — J449 Chronic obstructive pulmonary disease, unspecified: Secondary | ICD-10-CM | POA: Diagnosis not present

## 2015-11-13 DIAGNOSIS — J45909 Unspecified asthma, uncomplicated: Secondary | ICD-10-CM | POA: Diagnosis not present

## 2015-11-23 ENCOUNTER — Other Ambulatory Visit: Payer: Self-pay

## 2015-11-23 DIAGNOSIS — J449 Chronic obstructive pulmonary disease, unspecified: Secondary | ICD-10-CM | POA: Diagnosis not present

## 2015-11-23 DIAGNOSIS — J45909 Unspecified asthma, uncomplicated: Secondary | ICD-10-CM | POA: Diagnosis not present

## 2015-11-23 NOTE — Patient Outreach (Signed)
[facility]) Care Management  11/23/2015  Hollie BeachJulia Stobaugh 07/17/1950 [id number]  Unsuccessful attempt to reach patient.  HIPAA appropriate message left with call back information.  If no call back, RN will make another attempt within 2 weeks.  Tyler Deisonnie Rhonna Holster, RN, MSN RN [employer] [facility] [phone] Fax [phone]

## 2015-11-26 DIAGNOSIS — I10 Essential (primary) hypertension: Secondary | ICD-10-CM | POA: Diagnosis not present

## 2015-11-26 DIAGNOSIS — I349 Nonrheumatic mitral valve disorder, unspecified: Secondary | ICD-10-CM | POA: Diagnosis not present

## 2015-11-30 DIAGNOSIS — R269 Unspecified abnormalities of gait and mobility: Secondary | ICD-10-CM | POA: Diagnosis not present

## 2015-11-30 DIAGNOSIS — B351 Tinea unguium: Secondary | ICD-10-CM | POA: Diagnosis not present

## 2015-11-30 DIAGNOSIS — L84 Corns and callosities: Secondary | ICD-10-CM | POA: Diagnosis not present

## 2015-11-30 DIAGNOSIS — E1159 Type 2 diabetes mellitus with other circulatory complications: Secondary | ICD-10-CM | POA: Diagnosis not present

## 2015-11-30 DIAGNOSIS — J45909 Unspecified asthma, uncomplicated: Secondary | ICD-10-CM | POA: Diagnosis not present

## 2015-11-30 DIAGNOSIS — M356 Relapsing panniculitis [Weber-Christian]: Secondary | ICD-10-CM | POA: Diagnosis not present

## 2015-11-30 DIAGNOSIS — J449 Chronic obstructive pulmonary disease, unspecified: Secondary | ICD-10-CM | POA: Diagnosis not present

## 2015-11-30 DIAGNOSIS — M79672 Pain in left foot: Secondary | ICD-10-CM | POA: Diagnosis not present

## 2015-11-30 DIAGNOSIS — M79671 Pain in right foot: Secondary | ICD-10-CM | POA: Diagnosis not present

## 2015-12-02 ENCOUNTER — Other Ambulatory Visit: Payer: Self-pay

## 2015-12-02 DIAGNOSIS — E1310 Other specified diabetes mellitus with ketoacidosis without coma: Secondary | ICD-10-CM

## 2015-12-02 NOTE — Patient Outreach (Signed)
[facility]) Care Management  12/02/2015  Morgan BeachJulia Fischer 11/24/1950 [id number]  Telephone assessment with patient.  She reports no hypoglycemic episodes, but that in past few weeks her fasting cbgs have been high.  She states she called her MD twice this week to discuss this, but has received no call back.  Plan: RN will notify MD of elevated cbgs.          Patient will call MD if she does not hear from him before the end of the week.          RN will follow up in December.  Tyler Deisonnie Shahmeer Bunn, RN, MSN RN [employer] [facility] [phone] Fax [phone]

## 2015-12-02 NOTE — Patient Outreach (Signed)
[facility]) Care Management  12/02/2015  Hollie BeachJulia Hagberg 08/20/1950 [id number]   Second unsuccessful attempt to reach patient.  HIPAA appropriate message left requesting call back. If no response RN will make another attempt within 2 weeks.  Tyler Deisonnie Avery Klingbeil, RN, MSN RN [employer] [facility] [phone] Fax [phone]

## 2015-12-13 DIAGNOSIS — J449 Chronic obstructive pulmonary disease, unspecified: Secondary | ICD-10-CM | POA: Diagnosis not present

## 2015-12-13 DIAGNOSIS — J45909 Unspecified asthma, uncomplicated: Secondary | ICD-10-CM | POA: Diagnosis not present

## 2015-12-14 ENCOUNTER — Ambulatory Visit: Payer: Self-pay

## 2015-12-17 DIAGNOSIS — I429 Cardiomyopathy, unspecified: Secondary | ICD-10-CM | POA: Diagnosis not present

## 2015-12-17 DIAGNOSIS — E785 Hyperlipidemia, unspecified: Secondary | ICD-10-CM | POA: Diagnosis not present

## 2015-12-17 DIAGNOSIS — I10 Essential (primary) hypertension: Secondary | ICD-10-CM | POA: Diagnosis not present

## 2015-12-17 DIAGNOSIS — G4733 Obstructive sleep apnea (adult) (pediatric): Secondary | ICD-10-CM | POA: Diagnosis not present

## 2015-12-17 DIAGNOSIS — I119 Hypertensive heart disease without heart failure: Secondary | ICD-10-CM | POA: Diagnosis not present

## 2015-12-17 DIAGNOSIS — E119 Type 2 diabetes mellitus without complications: Secondary | ICD-10-CM | POA: Diagnosis not present

## 2015-12-17 DIAGNOSIS — J45909 Unspecified asthma, uncomplicated: Secondary | ICD-10-CM | POA: Diagnosis not present

## 2015-12-17 DIAGNOSIS — G4700 Insomnia, unspecified: Secondary | ICD-10-CM | POA: Diagnosis not present

## 2015-12-21 ENCOUNTER — Other Ambulatory Visit: Payer: Self-pay

## 2015-12-21 NOTE — Patient Outreach (Signed)
[facility]) Care Management  12/21/2015  Hollie BeachJulia Crymes 02/01/1950 [id number]   Unsuccessful attempt to reach patient by phone.  HIPAA appropriate message left with contact information to call back. If no response RN will make another attempt  Within 10 working days.  Tyler Deisonnie Mikhia Dusek, RN, MSN RN [employer] [facility] [phone] Fax [phone]

## 2015-12-22 DIAGNOSIS — G4730 Sleep apnea, unspecified: Secondary | ICD-10-CM | POA: Diagnosis not present

## 2015-12-22 DIAGNOSIS — R0602 Shortness of breath: Secondary | ICD-10-CM | POA: Diagnosis not present

## 2015-12-22 DIAGNOSIS — J45909 Unspecified asthma, uncomplicated: Secondary | ICD-10-CM | POA: Diagnosis not present

## 2015-12-22 DIAGNOSIS — I2781 Cor pulmonale (chronic): Secondary | ICD-10-CM | POA: Diagnosis not present

## 2015-12-23 ENCOUNTER — Ambulatory Visit: Payer: Self-pay

## 2015-12-23 DIAGNOSIS — J45909 Unspecified asthma, uncomplicated: Secondary | ICD-10-CM | POA: Diagnosis not present

## 2015-12-23 DIAGNOSIS — J449 Chronic obstructive pulmonary disease, unspecified: Secondary | ICD-10-CM | POA: Diagnosis not present

## 2015-12-30 ENCOUNTER — Other Ambulatory Visit: Payer: Self-pay

## 2015-12-30 DIAGNOSIS — R269 Unspecified abnormalities of gait and mobility: Secondary | ICD-10-CM | POA: Diagnosis not present

## 2015-12-30 DIAGNOSIS — J45909 Unspecified asthma, uncomplicated: Secondary | ICD-10-CM | POA: Diagnosis not present

## 2015-12-30 DIAGNOSIS — M356 Relapsing panniculitis [Weber-Christian]: Secondary | ICD-10-CM | POA: Diagnosis not present

## 2015-12-30 DIAGNOSIS — J449 Chronic obstructive pulmonary disease, unspecified: Secondary | ICD-10-CM | POA: Diagnosis not present

## 2015-12-30 DIAGNOSIS — I349 Nonrheumatic mitral valve disorder, unspecified: Secondary | ICD-10-CM | POA: Diagnosis not present

## 2015-12-30 DIAGNOSIS — I10 Essential (primary) hypertension: Secondary | ICD-10-CM | POA: Diagnosis not present

## 2015-12-30 DIAGNOSIS — E1310 Other specified diabetes mellitus with ketoacidosis without coma: Secondary | ICD-10-CM

## 2015-12-30 NOTE — Patient Outreach (Signed)
[facility]) Care Management  12/30/2015  Morgan BeachJulia Fischer 09/22/1950 [id number]  Telephonic assessment with patient. She reports some on-going problems with elevated cbgs, but admits that she has had difficulties sticking to dietary guidelines during the holidays.  She has not yet increased Lantus to 20 Units am and pm as PCP directed.  Encouraged her to monitor cbgs closely and increase Lantus as directed if cbgs remain elevated.  Patient reports MD stated his goal is to keep her fasting cbgs between 100 and 150.  Encouraged patient to keep PCP informed of any issues or problems.  She reports she has left messages for him, but that she doesn't always get a call back.  Encouraged her to try [employer] messaging.  Patient has ongoing problem with constipation.  She states PCP does not want her to take laxatives, but instead try increasing fiber in her diet.  Education provided on high fiber vegetables and whole grains.  Plan:  Patient will implement MD order to increase Lantus to 20 units am and pm if indicated by elevated cbgs.           Patient will notify PCP if cbgs remain elevated.           RN will follow up in January.  Morgan Deisonnie Kaleiah Kutzer, RN, MSN RN [facility] [employer]Health Coach [facility] [phone] Fax [phone]

## 2016-01-13 DIAGNOSIS — J45909 Unspecified asthma, uncomplicated: Secondary | ICD-10-CM | POA: Diagnosis not present

## 2016-01-13 DIAGNOSIS — J449 Chronic obstructive pulmonary disease, unspecified: Secondary | ICD-10-CM | POA: Diagnosis not present

## 2016-01-23 DIAGNOSIS — J45909 Unspecified asthma, uncomplicated: Secondary | ICD-10-CM | POA: Diagnosis not present

## 2016-01-23 DIAGNOSIS — J449 Chronic obstructive pulmonary disease, unspecified: Secondary | ICD-10-CM | POA: Diagnosis not present

## 2016-01-27 ENCOUNTER — Other Ambulatory Visit: Payer: Self-pay

## 2016-01-27 VITALS — Ht 63.0 in | Wt 314.0 lb

## 2016-01-27 DIAGNOSIS — E1310 Other specified diabetes mellitus with ketoacidosis without coma: Secondary | ICD-10-CM

## 2016-01-27 NOTE — Patient Outreach (Signed)
[facility]) Care Management  01/27/2016  Hollie BeachJulia Skiver 10/30/1950 [id number]  Telephone assessment with patient.  She reports some improvement in cbgs with increase of Lantus to 20U twice a day.  She reports range of fasting cbgs is between 111-159.  Her A1C will be rechecked in March.  Patient states she continues to struggle with limiting carbohydrates, and that her weight remains over 300 pounds.   Plan: Patient will focus on limiting carbohydrates.          Patient will schedule her Lasix for am and mid-afternoon due to frequent night time urination.          RN will send EMMI articles on exercise and activity for weight loss, HF water pills and thirst, and HF salt smart.          RN will follow up in February.   Tyler Deisonnie Masen Luallen, RN, MSN RN [facility] [phone] Fax [phone]

## 2016-01-28 ENCOUNTER — Other Ambulatory Visit: Payer: Self-pay | Admitting: Pharmacist

## 2016-01-28 NOTE — Patient Outreach (Signed)
[facility]) Care Management  [facility]   01/28/2016  Morgan BeachJulia Fischer 02/16/1950 [id number]  Subjective:  Patient was called regarding medication assistance [facility] care manager, Junious Dresseronnie.  HIPAA identifiers were obtained.  Patient has multiple medical conditions including but not limited to:  Asthma, morbid obesity, hypertension, and type 2 diabetes.  Patient reported having extreme difficulty affording her medications. Specifically:  Lantus, Advair, and Trulicity.  She had a 90 day supply of her medications filled and the pharmacist told her she would be in the donut hole when she gets the next refills.  Objective:   Encounter Medications: Outpatient Encounter Prescriptions as of 01/28/2016  Medication Sig Note  . ACCU-CHEK AVIVA PLUS test strip Inject 1 applicator into the skin as needed.   Marland Kitchen. allopurinol (ZYLOPRIM) 300 MG tablet Take 300 mg by mouth daily.   Marland Kitchen. amLODipine (NORVASC) 10 MG tablet Take 10 mg by mouth daily.   Marland Kitchen. aspirin 325 MG EC tablet Take 325 mg by mouth daily.   Marland Kitchen. atenolol (TENORMIN) 100 MG tablet Take 1 tablet (100 mg total) by mouth daily.   . Calcium Carb-Cholecalciferol (CALCIUM-VITAMIN D3) 500-400 MG-UNIT TABS Take 1 tablet by mouth 2 (two) times daily.   . Cyanocobalamin (VITAMIN B-12 PO) Take 1 tablet by mouth daily.   . Dulaglutide (TRULICITY [place]) Inject 1 application into the skin once a week.    . Fluticasone-Salmeterol (ADVAIR) 250-50 MCG/DOSE AEPB Inhale 1 puff into the lungs 2 (two) times daily.   . furosemide (LASIX) 40 MG tablet Take 1 tablet (40 mg total) by mouth 2 (two) times daily.   Marland Kitchen. glimepiride (AMARYL) 4 MG tablet Take 4 mg by mouth daily.    Marland Kitchen. ibuprofen (ADVIL,MOTRIN) 600 MG tablet Take 1 tablet (600 mg total) by mouth every 6 (six) hours as needed. 01/28/2016: PRN ONLY  . ipratropium-albuterol (DUONEB) 0.5-2.5 (3) MG/3ML SOLN Take 3 mLs by nebulization 2 (two) times daily.   Marland Kitchen. LANTUS SOLOSTAR 100 UNIT/ML  Solostar Pen Inject 20 Units into the skin 2 (two) times daily.    Marland Kitchen. losartan (COZAAR) 50 MG tablet Take 50 mg by mouth daily.   . metFORMIN (GLUCOPHAGE) 1000 MG tablet Take 1,000 mg by mouth daily with breakfast.    . montelukast (SINGULAIR) 10 MG tablet Take 10 mg by mouth at bedtime.   . Multiple Vitamin (MULTIVITAMIN WITH MINERALS) TABS tablet Take 1 tablet by mouth daily.   . Omega-3 Fatty Acids (FISH OIL) 1000 MG CAPS Take 1,000 mg by mouth 2 (two) times daily.    . potassium chloride SA (K-DUR,KLOR-CON) 20 MEQ tablet Take 2 tablets (40 mEq total) by mouth 2 (two) times daily.   . rosuvastatin (CRESTOR) 40 MG tablet Take 40 mg by mouth daily.   . [DISCONTINUED] insulin glargine (LANTUS) 100 UNIT/ML injection Inject 20 Units into the skin 2 (two) times daily.    . Garlic 1000 MG CAPS Take 1,000 mg by mouth daily. 01/28/2016: Has not been able to get this from the drug store due to stock issues.  . nitroGLYCERIN (NITROSTAT) 0.4 MG SL tablet Place 1 tablet (0.4 mg total) under the tongue every 5 (five) minutes as needed for chest pain. (Patient not taking: Reported on 01/28/2016)   . [DISCONTINUED] calcium carbonate (CALCIUM 600) 600 MG TABS tablet Take 600 mg by mouth daily with breakfast.   . [DISCONTINUED] cholecalciferol (VITAMIN D) 1000 UNITS tablet Take 1,000 Units by mouth daily.   . [DISCONTINUED] cholecalciferol (  VITAMIN D) 1000 units tablet Take 1,000 mg by mouth daily.   . [DISCONTINUED] fenoprofen (NALFON) 600 MG TABS tablet Take 600 mg by mouth.    No facility-administered encounter medications on file as of 01/28/2016.     Functional Status: In your present state of health, do you have any difficulty performing the following activities: 10/27/2015 10/13/2015  Hearing? N N  Vision? N N  Difficulty concentrating or making decisions? - N  Walking or climbing stairs? - Y  Dressing or bathing? - N  Doing errands, shopping? - N  Quarry manager and eating ? - N  Using the Toilet? -  N  In the past six months, have you accidently leaked urine? - N  Do you have problems with loss of bowel control? - N  Managing your Medications? - N  Managing your Finances? - N  Housekeeping or managing your Housekeeping? - N  Some recent data might be hidden    Fall/Depression Screening: PHQ 2/9 Scores 10/12/2015 09/29/2015  PHQ - 2 Score 0 0    Assessment:  Medications were reviewed with the patient via telephone   Drugs sorted by system:  Neurologic/Psychologic:  Cardiovascular: Atenolol Aspirin Amlodipine Rosuvastatin Losartan Nitroglycerin Furosemide Omega 3 Fatty Acids  Pulmonary/Allergy: Montelukast Ipratropium-albuterol Advair  Endocrine: Metformin 1000mg  Lantus Trulicity Glimepiride  Pain Ibuprofen (PRN only)  Vitamins/Minerals: Potassium Calcium Carbonate Cholecalciferol Cyanocobalamin Garlic Multiple Vitamin  Infectious Diseases:  Miscellaneous: Allopurinol   Medication Findings:  Adherence:   Metformin-medication list in patient's chart lists metformin 1000mg  1 tablet twice daily. Patient reported only taking Metformin 1000mg  1 tablet in the morning.  Dose Discrepancy: Lantus-medication list in patient's chart lists Lantus 40 units in the morning and 30 units in the evening. Patient reported her dose as 20 units twice daily.  Patient reported having difficulty remembering the second Lantus dose. Since her total daily dose is only 40 units, and if deemed therapeutically appropriate, daily dosing may increase compliance.   Medication Assistance:  Patient has [employer] Medicare Gold Plus. Per plan, she has a $195 deductible for medications for the 2018 coverage year.  Patient is on tier 1-2 medications with the exception of:   Advair, Lantus, and Trulicity (tier 3).   Patient was able to get her medications filled for a 90 day supply but will most likely be in the donut hole at her next refill.    Patient is over income for the  Extra Help Program.  She may qualify for patient assistance from [employer] for [employer] after spending $1,100 on medications, Lantus from [employer] after spending 5% of her income on medications, and Advair from [employer] after spending $600 on medications.  Patient expressed extreme difficulty paying $1,100 for medications.  It was suggested patient purchase a 30 day supply of medications vs purchasing a 90 day supply until she can meet some of the out-of-pocket spend requirements from the Patient Assistance Programs.  Medicare Part D, donut hole, deductible, copays and the Extra Help program were all reviewed with the patient.  Plan:  1.  Route note to Dr. Cloyd Stagers- Bonsu 2.  Follow up with patient next week on ability to purchase a 30 day supply of medications.

## 2016-01-30 DIAGNOSIS — J449 Chronic obstructive pulmonary disease, unspecified: Secondary | ICD-10-CM | POA: Diagnosis not present

## 2016-01-30 DIAGNOSIS — J45909 Unspecified asthma, uncomplicated: Secondary | ICD-10-CM | POA: Diagnosis not present

## 2016-01-30 DIAGNOSIS — R269 Unspecified abnormalities of gait and mobility: Secondary | ICD-10-CM | POA: Diagnosis not present

## 2016-01-30 DIAGNOSIS — M356 Relapsing panniculitis [Weber-Christian]: Secondary | ICD-10-CM | POA: Diagnosis not present

## 2016-01-31 ENCOUNTER — Other Ambulatory Visit: Payer: Self-pay | Admitting: Pharmacist

## 2016-01-31 ENCOUNTER — Encounter: Payer: Self-pay | Admitting: Pharmacist

## 2016-01-31 NOTE — Patient Outreach (Signed)
[facility]) Care Management  01/31/2016  Morgan BeachJulia Fischer 05/02/1950 [id number]   Patient was called to follow up after our conversation last week about medication assistance.  HIPAA identifiers were obtained.  To review:  Patient reported having extreme difficulty affording her medications. Specifically:  Lantus, Advair, and Trulicity.  She had a 90 day supply of her medications filled and the pharmacist told her she would be in the donut hole when she gets the next refills.  While speaking with the patient today, it was suggested she purchase a 30 day supply of medications vs purchasing a 90 day supply until she can meet some of the out-of-pocket spend requirements from the Patient Assistance Programs.  Patient agreed and stated she was already going to spend >$300 this month for her medications due to the $195 deductible.  It was also suggested the patient keep all copies of statements from her insurance with her out of pocket costs and to gather financial documents like her 2017 tax return, once completed.  Patient agreed.  Plan:  I will follow up with the patient in about 30 days to see if she has received a statement from her insurance company to obtain her out-of-pocket spend.  Morgan McardleKatina J. Fischer Tesoro, PharmD, BCACP [facility] Clinical Pharmacist [phone]

## 2016-02-10 DIAGNOSIS — B351 Tinea unguium: Secondary | ICD-10-CM | POA: Diagnosis not present

## 2016-02-10 DIAGNOSIS — E1159 Type 2 diabetes mellitus with other circulatory complications: Secondary | ICD-10-CM | POA: Diagnosis not present

## 2016-02-10 DIAGNOSIS — L84 Corns and callosities: Secondary | ICD-10-CM | POA: Diagnosis not present

## 2016-02-10 DIAGNOSIS — M79671 Pain in right foot: Secondary | ICD-10-CM | POA: Diagnosis not present

## 2016-02-10 DIAGNOSIS — M79672 Pain in left foot: Secondary | ICD-10-CM | POA: Diagnosis not present

## 2016-02-13 DIAGNOSIS — J449 Chronic obstructive pulmonary disease, unspecified: Secondary | ICD-10-CM | POA: Diagnosis not present

## 2016-02-13 DIAGNOSIS — J45909 Unspecified asthma, uncomplicated: Secondary | ICD-10-CM | POA: Diagnosis not present

## 2016-02-23 DIAGNOSIS — J45909 Unspecified asthma, uncomplicated: Secondary | ICD-10-CM | POA: Diagnosis not present

## 2016-02-23 DIAGNOSIS — J449 Chronic obstructive pulmonary disease, unspecified: Secondary | ICD-10-CM | POA: Diagnosis not present

## 2016-02-24 ENCOUNTER — Ambulatory Visit: Payer: Self-pay

## 2016-03-01 ENCOUNTER — Other Ambulatory Visit: Payer: Self-pay | Admitting: Pharmacist

## 2016-03-01 ENCOUNTER — Other Ambulatory Visit: Payer: Self-pay

## 2016-03-01 VITALS — Wt 320.0 lb

## 2016-03-01 DIAGNOSIS — J45909 Unspecified asthma, uncomplicated: Secondary | ICD-10-CM | POA: Diagnosis not present

## 2016-03-01 DIAGNOSIS — J449 Chronic obstructive pulmonary disease, unspecified: Secondary | ICD-10-CM | POA: Diagnosis not present

## 2016-03-01 DIAGNOSIS — M356 Relapsing panniculitis [Weber-Christian]: Secondary | ICD-10-CM | POA: Diagnosis not present

## 2016-03-01 DIAGNOSIS — R269 Unspecified abnormalities of gait and mobility: Secondary | ICD-10-CM | POA: Diagnosis not present

## 2016-03-01 DIAGNOSIS — E1310 Other specified diabetes mellitus with ketoacidosis without coma: Secondary | ICD-10-CM

## 2016-03-01 NOTE — Patient Outreach (Signed)
[facility]) Care Management  03/01/2016  Morgan BeachJulia Fischer 09/20/1950 [id number]  Telephone assessment with patient.  She reports cbgs have been ranging between 100-120, with one reading of 67. Patient states she still struggles to follow diabetic dietary guidelines, and that she still has authorization to return to the diabetic classes at the hospital.  Patient also states her weight fluctuates, often as much as 3 pounds from day to day.  Educated patient on 'yellow zone' symptoms, and need to report changes in breathing or increases of weight of 3 pounds over night to MD. Patient did state that she has read EMMI articles on salt use and 'water pills'.  Plan:  Patient will consider returning to diabetes classes to reinforce dietary guidelines.           Patient will take her 2nd daily dose of lasix in late afternoon instead of bed time in order to decrease nocturia.           Patient will use her CHF Stop-Light Tool to monitor and manage CHF.           RN will follow up in March.  Morgan Fischer Morgan Dileonardo, RN, MSN RN [employer] [phone] Fax [phone]

## 2016-03-01 NOTE — Patient Outreach (Signed)
[facility]) Care Management  03/01/2016  Morgan Fischer 04/23/1950 [id number]   Patient was called to follow up on medication assistance. HIPAA identifiers were obtained.  Patient reported paying $326 for her insulin last month.  This will help her meet her $195 deductible for her insurance.  She also reported that she is still receiving Trulicity via samples from her provider.  She stated she has not received a new EOB from her insurance company but is sure she has only paid $326 in medications this year.  She received a 90 day supply of insulin on her last fill.  She may qualify for patient assistance from [employer] for [employer] after spending $1,100 on medications, Lantus from [employer] after spending 5% of her income on medications, and Advair from [employer] after spending $600 on medications. She may qualify for patient assistance from [employer] for [employer] after spending $1,100 on medications, Lantus from [employer] after spending 5% of her income on medications, and Advair from [employer] after spending $600 on medications.    Plan:  1.  Call patient back in 3 months to see where she is in terms of out-of-pocket spend for medications.  Beecher McardleKatina J. Michala Deblanc, PharmD, BCACP [facility] Clinical Pharmacist [phone]

## 2016-03-02 DIAGNOSIS — J449 Chronic obstructive pulmonary disease, unspecified: Secondary | ICD-10-CM | POA: Diagnosis not present

## 2016-03-02 DIAGNOSIS — M356 Relapsing panniculitis [Weber-Christian]: Secondary | ICD-10-CM | POA: Diagnosis not present

## 2016-03-02 DIAGNOSIS — J45909 Unspecified asthma, uncomplicated: Secondary | ICD-10-CM | POA: Diagnosis not present

## 2016-03-03 DIAGNOSIS — M356 Relapsing panniculitis [Weber-Christian]: Secondary | ICD-10-CM | POA: Diagnosis not present

## 2016-03-03 DIAGNOSIS — J449 Chronic obstructive pulmonary disease, unspecified: Secondary | ICD-10-CM | POA: Diagnosis not present

## 2016-03-03 DIAGNOSIS — J45909 Unspecified asthma, uncomplicated: Secondary | ICD-10-CM | POA: Diagnosis not present

## 2016-03-06 DIAGNOSIS — E662 Morbid (severe) obesity with alveolar hypoventilation: Secondary | ICD-10-CM | POA: Diagnosis not present

## 2016-03-07 DIAGNOSIS — I2781 Cor pulmonale (chronic): Secondary | ICD-10-CM | POA: Diagnosis not present

## 2016-03-07 DIAGNOSIS — J45909 Unspecified asthma, uncomplicated: Secondary | ICD-10-CM | POA: Diagnosis not present

## 2016-03-07 DIAGNOSIS — R0689 Other abnormalities of breathing: Secondary | ICD-10-CM | POA: Diagnosis not present

## 2016-03-07 DIAGNOSIS — R0602 Shortness of breath: Secondary | ICD-10-CM | POA: Diagnosis not present

## 2016-03-09 DIAGNOSIS — G4733 Obstructive sleep apnea (adult) (pediatric): Secondary | ICD-10-CM | POA: Diagnosis not present

## 2016-03-09 DIAGNOSIS — I119 Hypertensive heart disease without heart failure: Secondary | ICD-10-CM | POA: Diagnosis not present

## 2016-03-09 DIAGNOSIS — I10 Essential (primary) hypertension: Secondary | ICD-10-CM | POA: Diagnosis not present

## 2016-03-09 DIAGNOSIS — I429 Cardiomyopathy, unspecified: Secondary | ICD-10-CM | POA: Diagnosis not present

## 2016-03-09 DIAGNOSIS — J45909 Unspecified asthma, uncomplicated: Secondary | ICD-10-CM | POA: Diagnosis not present

## 2016-03-09 DIAGNOSIS — E785 Hyperlipidemia, unspecified: Secondary | ICD-10-CM | POA: Diagnosis not present

## 2016-03-09 DIAGNOSIS — Z0000 Encounter for general adult medical examination without abnormal findings: Secondary | ICD-10-CM | POA: Diagnosis not present

## 2016-03-09 DIAGNOSIS — E119 Type 2 diabetes mellitus without complications: Secondary | ICD-10-CM | POA: Diagnosis not present

## 2016-03-09 DIAGNOSIS — Z1389 Encounter for screening for other disorder: Secondary | ICD-10-CM | POA: Diagnosis not present

## 2016-03-10 ENCOUNTER — Encounter: Payer: Self-pay | Admitting: *Deleted

## 2016-03-12 DIAGNOSIS — J45909 Unspecified asthma, uncomplicated: Secondary | ICD-10-CM | POA: Diagnosis not present

## 2016-03-12 DIAGNOSIS — J449 Chronic obstructive pulmonary disease, unspecified: Secondary | ICD-10-CM | POA: Diagnosis not present

## 2016-03-17 ENCOUNTER — Encounter: Payer: Self-pay | Admitting: Cardiology

## 2016-03-17 ENCOUNTER — Ambulatory Visit (INDEPENDENT_AMBULATORY_CARE_PROVIDER_SITE_OTHER): Payer: Medicare HMO | Admitting: Cardiology

## 2016-03-17 VITALS — BP 140/66 | HR 77 | Ht 64.0 in | Wt 319.0 lb

## 2016-03-17 DIAGNOSIS — I2510 Atherosclerotic heart disease of native coronary artery without angina pectoris: Secondary | ICD-10-CM

## 2016-03-17 DIAGNOSIS — I350 Nonrheumatic aortic (valve) stenosis: Secondary | ICD-10-CM | POA: Diagnosis not present

## 2016-03-17 DIAGNOSIS — R0902 Hypoxemia: Secondary | ICD-10-CM | POA: Diagnosis not present

## 2016-03-17 DIAGNOSIS — I110 Hypertensive heart disease with heart failure: Secondary | ICD-10-CM | POA: Diagnosis not present

## 2016-03-17 DIAGNOSIS — I5032 Chronic diastolic (congestive) heart failure: Secondary | ICD-10-CM | POA: Diagnosis not present

## 2016-03-17 DIAGNOSIS — E119 Type 2 diabetes mellitus without complications: Secondary | ICD-10-CM

## 2016-03-17 DIAGNOSIS — I10 Essential (primary) hypertension: Secondary | ICD-10-CM

## 2016-03-17 DIAGNOSIS — I700 Atherosclerosis of aorta: Secondary | ICD-10-CM

## 2016-03-17 HISTORY — DX: Hypertensive heart disease with heart failure: I11.0

## 2016-03-17 HISTORY — DX: Atherosclerotic heart disease of native coronary artery without angina pectoris: I25.10

## 2016-03-17 HISTORY — DX: Chronic diastolic (congestive) heart failure: I50.32

## 2016-03-17 HISTORY — DX: Nonrheumatic aortic (valve) stenosis: I35.0

## 2016-03-17 HISTORY — DX: Hypoxemia: R09.02

## 2016-03-17 NOTE — Patient Instructions (Signed)

## 2016-03-17 NOTE — Progress Notes (Signed)
Cardiology Office Note    Date:  03/17/2016   ID:  Morgan Fischer, DOB 05-Aug-1950, MRN [id number]  PCP:  Jackie Plum, MD  Cardiologist:   Donato Schultz, MD   No chief complaint on file.   History of Present Illness:  Morgan Fischer is a 66 y.o. female with morbid obesity, diabetes, hypertension, hyperlipidemia on home oxygen with obesity hypoventilation syndrome since 2004 with chronic diastolic heart failure, ejection fraction 70% with severe left ventricular hypertrophy, hypertensive heart disease with heart failure and possible mild aortic stenosis here for follow-up.  T-wave inversions on EKG abnormality insistent with severe LVH. Prior hospitalization on 08/28/15 with abdominal wall panniculitis treated with high-dose antibiotics area  No sudden cardiac death in her family, no unexplained syncope.  Aortic valve with mean gradient of 20 mmHg, at least mild aortic stenosis.     Past Medical History:  Diagnosis Date  . Arthritis   . Asthma   . CHF (congestive heart failure) (HCC)   . Diabetes mellitus without complication (HCC)   . High cholesterol   . Hypertension   . Morbid obesity (HCC)     Past Surgical History:  Procedure Laterality Date  . ABDOMINAL HYSTERECTOMY    . CESAREAN SECTION      Current Medications: Outpatient Medications Prior to Visit  Medication Sig Dispense Refill  . ACCU-CHEK AVIVA PLUS test strip Inject 1 applicator into the skin as needed.    Marland Kitchen allopurinol (ZYLOPRIM) 300 MG tablet Take 300 mg by mouth daily.    Marland Kitchen amLODipine (NORVASC) 10 MG tablet Take 10 mg by mouth daily.    Marland Kitchen aspirin 325 MG EC tablet Take 325 mg by mouth daily.    Marland Kitchen atenolol (TENORMIN) 100 MG tablet Take 1 tablet (100 mg total) by mouth daily. 30 tablet 0  . Calcium Carb-Cholecalciferol (CALCIUM-VITAMIN D3) 500-400 MG-UNIT TABS Take 1 tablet by mouth 2 (two) times daily.    . Cyanocobalamin (VITAMIN B-12 PO) Take 1 tablet by mouth daily.    . Dulaglutide (TRULICITY) 0.75  MG/0.5ML SOPN Inject 1 application into the skin once a week.     . Fluticasone-Salmeterol (ADVAIR) 250-50 MCG/DOSE AEPB Inhale 1 puff into the lungs 2 (two) times daily.    . furosemide (LASIX) 40 MG tablet Take 1 tablet (40 mg total) by mouth 2 (two) times daily. 60 tablet 0  . Garlic 1000 MG CAPS Take 1,000 mg by mouth daily.    Marland Kitchen glimepiride (AMARYL) 4 MG tablet Take 4 mg by mouth daily.     Marland Kitchen ibuprofen (ADVIL,MOTRIN) 600 MG tablet Take 1 tablet (600 mg total) by mouth every 6 (six) hours as needed. 40 tablet 0  . ipratropium-albuterol (DUONEB) 0.5-2.5 (3) MG/3ML SOLN Take 3 mLs by nebulization 2 (two) times daily.    Marland Kitchen LANTUS SOLOSTAR 100 UNIT/ML Solostar Pen Inject 20 Units into the skin 2 (two) times daily.     Marland Kitchen losartan (COZAAR) 50 MG tablet Take 50 mg by mouth daily.    . metFORMIN (GLUCOPHAGE) 1000 MG tablet Take 1,000 mg by mouth daily with breakfast.     . montelukast (SINGULAIR) 10 MG tablet Take 10 mg by mouth at bedtime.    . Multiple Vitamin (MULTIVITAMIN WITH MINERALS) TABS tablet Take 1 tablet by mouth daily.    . nitroGLYCERIN (NITROSTAT) 0.4 MG SL tablet Place 1 tablet (0.4 mg total) under the tongue every 5 (five) minutes as needed for chest pain. 30 tablet 0  . Omega-3 Fatty Acids (FISH  OIL) 1000 MG CAPS Take 1,000 mg by mouth 2 (two) times daily.     . potassium chloride SA (K-DUR,KLOR-CON) 20 MEQ tablet Take 2 tablets (40 mEq total) by mouth 2 (two) times daily. 60 tablet 0  . rosuvastatin (CRESTOR) 40 MG tablet Take 40 mg by mouth daily.     No facility-administered medications prior to visit.      Allergies:   Ciprocinonide [fluocinolone]; Catapres [clonidine hcl]; and Demadex [torsemide]   Social History   Social History  . Marital status: Married    Spouse name: N/A  . Number of children: N/A  . Years of education: N/A   Social History Main Topics  . Smoking status: Never Smoker  . Smokeless tobacco: Never Used  . Alcohol use No  . Drug use: Unknown  .  Sexual activity: Not Asked   Other Topics Concern  . None   Social History Narrative  . None     Family History:  The patient's family history includes Breast cancer in her sister; CAD in her mother; Cancer in her father and sister; Diabetes in her mother; Diabetes Mellitus I in her mother; Heart disease in her father.   ROS:   Please see the history of present illness.    ROS All other systems reviewed and are negative.   PHYSICAL EXAM:   VS:  BP 140/66   Pulse 77   Ht 5\' 4"  (1.626 m)   Wt (!) 319 lb (144.7 kg)   SpO2 98%   BMI 54.76 kg/m    GEN: Well nourished, well developed, in no acute distress  HEENT: normal  Neck: no JVD, carotid bruits, or masses Cardiac: RRR; 3/6 outflow murmur, rubs, or gallops, chronic edema  Respiratory:  clear to auscultation bilaterally, normal work of breathing GI: soft, nontender, nondistended, + BS, morbid obesity MS: no deformity or atrophy  Skin: warm and dry, no rash Neuro:  Alert and Oriented x 3, Strength and sensation are intact Psych: euthymic mood, full affect  Wt Readings from Last 3 Encounters:  03/17/16 (!) 319 lb (144.7 kg)  03/01/16 (!) 320 lb (145.2 kg)  01/27/16 (!) 314 lb (142.4 kg)      Studies/Labs Reviewed:   EKG: EKG from 08/24/15 shows sinus rhythm with T-wave inversions diffusely, LVH. Personally viewed.  Recent Labs: 08/23/2015: B Natriuretic Peptide 395.9; Hemoglobin 11.5; Platelets 120 08/25/2015: Magnesium 2.0 08/26/2015: BUN 19; Creatinine, Ser 0.78; Potassium 3.7; Sodium 142   Lipid Panel    Component Value Date/Time   CHOL 181 08/24/2015 0422   TRIG 100 08/24/2015 0422   HDL 43 08/24/2015 0422   CHOLHDL 4.2 08/24/2015 0422   VLDL 20 08/24/2015 0422   LDLCALC 118 (H) 08/24/2015 0422    Additional studies/ records that were reviewed today include:   Echocardiogram 08/24/15  - Procedure narrative: Transthoracic echocardiography. Technically   difficult study with reduced echocardiographic windows  due to   body habitus. - Left ventricle: The cavity size is small. Wall thickness was   increased in a pattern of severe LVH. The estimated ejection   fraction was 80%. Doppler parameters are consistent with   pseudonormal left ventricular relaxation (grade 2 diastolic   dysfunction). The E/A ratio is >20, suggesting markedly elevated   LV filling pressure. - Aortic valve: Poorly visualized aortic valve which is calcified.   There is at least mild stenosis. Mean gradient (S): 20 mm Hg.   Peak gradient (S): 51 mm Hg. Valve area (VTI): 1.79 cm^2.  Valve   area (Vmax): 1.75 cm^2. - Mitral valve: Calcified annulus. Mildly thickened leaflets .   There was trivial regurgitation. - Left atrium: The atrium was mildly dilated. - Pericardium, extracardiac: A trivial pericardial effusion was   identified.  ASSESSMENT:    1. Chronic diastolic heart failure (HCC)   2. Morbid obesity due to excess calories (HCC)   3. Hypertensive heart disease with heart failure (HCC)   4. Hypoxia   5. Aortic atherosclerosis (HCC)   6. Nonrheumatic aortic valve stenosis   7. Diabetes mellitus with coincident hypertension (HCC)      PLAN:  In order of problems listed above:  Chronic diastolic heart failure  - Multifactorial as a result of morbid obesity, hypertension, left ventricular hypertrophy  - Continue with good treatment of hypertension, diuretics, salt restriction, fluid restriction.  - Encourage weight loss. She has been stable. No changes in her symptomatology.  Morbid obesity  - Continue to encourage weight loss. She was 3 and 50 pounds at one point. We talked about decreasing carbohydrates.  Aortic atherosclerosis  - 08/23/15 CT of abdomen and pelvis shows mild aortic atherosclerosis.  - Continue to advocate blood pressure control, lipid control.  Hypertensive heart disease with heart failure  - Left ventricular hypertrophy changes have been noted on echocardiogram. She is not demonstrated  any high risk symptoms of hypertrophic cardiomyopathy such as syncope or early first-degree relative of death.  - Continue treatment aggressively of hypertension. Weight loss.  Hypoxia  - Encourage weight loss. She continues to use home oxygen.  Aortic stenosis  - Likely mild aortic stenosis, murmur heard on exam. Radiation of murmur to carotids. Nonsurgical AS. Continue to monitor clinically.  Diabetes with insulin use and hypertension as well as chronic diastolic heart failure  - Hemoglobin A1c is 8. Continue to work hard on this. Managed by her primary physician.  Medication Adjustments/Labs and Tests Ordered: Current medicines are reviewed at length with the patient today.  Concerns regarding medicines are outlined above.  Medication changes, Labs and Tests ordered today are listed in the Patient Instructions below. Patient Instructions  Medication Instructions:  The current medical regimen is effective;  continue present plan and medications.  Follow-Up: Follow up in 1 year with Dr. Anne Fu.  You will receive a letter in the mail 2 months before you are due.  Please call us when you receive this letter to schedule your follow up appointment.  If you need a refill on your cardiac medications before your next appointment, please call your pharmacy.  Thank you for choosing [facility]!!        Signed, Donato Schultz, MD  03/17/2016 12:00 PM    [facility] [address] [place], [place], [place]  [address] Phone: [phone]; Fax: [phone]

## 2016-03-22 DIAGNOSIS — J449 Chronic obstructive pulmonary disease, unspecified: Secondary | ICD-10-CM | POA: Diagnosis not present

## 2016-03-22 DIAGNOSIS — J45909 Unspecified asthma, uncomplicated: Secondary | ICD-10-CM | POA: Diagnosis not present

## 2016-03-24 DIAGNOSIS — I2781 Cor pulmonale (chronic): Secondary | ICD-10-CM | POA: Diagnosis not present

## 2016-03-24 DIAGNOSIS — G4730 Sleep apnea, unspecified: Secondary | ICD-10-CM | POA: Diagnosis not present

## 2016-03-24 DIAGNOSIS — J449 Chronic obstructive pulmonary disease, unspecified: Secondary | ICD-10-CM | POA: Diagnosis not present

## 2016-03-24 DIAGNOSIS — J45909 Unspecified asthma, uncomplicated: Secondary | ICD-10-CM | POA: Diagnosis not present

## 2016-03-27 DIAGNOSIS — I10 Essential (primary) hypertension: Secondary | ICD-10-CM | POA: Diagnosis not present

## 2016-03-27 DIAGNOSIS — E109 Type 1 diabetes mellitus without complications: Secondary | ICD-10-CM | POA: Diagnosis not present

## 2016-03-27 DIAGNOSIS — H524 Presbyopia: Secondary | ICD-10-CM | POA: Diagnosis not present

## 2016-03-27 DIAGNOSIS — H2513 Age-related nuclear cataract, bilateral: Secondary | ICD-10-CM | POA: Diagnosis not present

## 2016-03-28 ENCOUNTER — Ambulatory Visit: Payer: Self-pay

## 2016-03-29 ENCOUNTER — Other Ambulatory Visit: Payer: Self-pay

## 2016-03-29 VITALS — Ht 64.0 in | Wt 317.0 lb

## 2016-03-29 DIAGNOSIS — J449 Chronic obstructive pulmonary disease, unspecified: Secondary | ICD-10-CM | POA: Diagnosis not present

## 2016-03-29 DIAGNOSIS — J45909 Unspecified asthma, uncomplicated: Secondary | ICD-10-CM | POA: Diagnosis not present

## 2016-03-29 DIAGNOSIS — E1310 Other specified diabetes mellitus with ketoacidosis without coma: Secondary | ICD-10-CM

## 2016-03-29 DIAGNOSIS — M356 Relapsing panniculitis [Weber-Christian]: Secondary | ICD-10-CM | POA: Diagnosis not present

## 2016-03-29 DIAGNOSIS — R269 Unspecified abnormalities of gait and mobility: Secondary | ICD-10-CM | POA: Diagnosis not present

## 2016-03-29 NOTE — Patient Outreach (Signed)
[facility](THN) Care Management  03/29/2016  Morgan BeachJulia Fischer 07/13/1950 [id number]   Telephone assessment completed with patient.  She reports cardiology visit on 3-26 with Dr. Anne FuSkains- that he did not change meds, and that she doesn't have a return appointment for one year.  She states he did encourage her to lose weight.  Her weight today is 317 pounds. She reports she is trying to limit carbohydrates.  Patient expressed interest in doing chair exercises.  Her activity level is limited, and she is on O2 continuously at 2L/min.  Plan:  Patient will continue daily cbg monitoring.           Patient will attempt to adher to diabetic dietary guidelines and limit carbohydrates.           Patient will practice fall prevention/safe ambulation practices, especially at night.           RN will mail educational materials on chair exercises and diabetic diet/carbohydrate limitations.           RN will follow up in April.  Tyler Deisonnie Melanny Wire, RN, MSN RN [employer]Health Coach [facility] [phone] Fax [phone]

## 2016-03-30 DIAGNOSIS — Z1231 Encounter for screening mammogram for malignant neoplasm of breast: Secondary | ICD-10-CM | POA: Diagnosis not present

## 2016-04-05 DIAGNOSIS — N6322 Unspecified lump in the left breast, upper inner quadrant: Secondary | ICD-10-CM | POA: Diagnosis not present

## 2016-04-05 DIAGNOSIS — N6320 Unspecified lump in the left breast, unspecified quadrant: Secondary | ICD-10-CM | POA: Diagnosis not present

## 2016-04-05 DIAGNOSIS — R928 Other abnormal and inconclusive findings on diagnostic imaging of breast: Secondary | ICD-10-CM | POA: Diagnosis not present

## 2016-04-12 DIAGNOSIS — J449 Chronic obstructive pulmonary disease, unspecified: Secondary | ICD-10-CM | POA: Diagnosis not present

## 2016-04-12 DIAGNOSIS — J45909 Unspecified asthma, uncomplicated: Secondary | ICD-10-CM | POA: Diagnosis not present

## 2016-04-13 DIAGNOSIS — E1159 Type 2 diabetes mellitus with other circulatory complications: Secondary | ICD-10-CM | POA: Diagnosis not present

## 2016-04-13 DIAGNOSIS — M79671 Pain in right foot: Secondary | ICD-10-CM | POA: Diagnosis not present

## 2016-04-13 DIAGNOSIS — M79672 Pain in left foot: Secondary | ICD-10-CM | POA: Diagnosis not present

## 2016-04-13 DIAGNOSIS — L84 Corns and callosities: Secondary | ICD-10-CM | POA: Diagnosis not present

## 2016-04-13 DIAGNOSIS — B351 Tinea unguium: Secondary | ICD-10-CM | POA: Diagnosis not present

## 2016-04-18 DIAGNOSIS — R269 Unspecified abnormalities of gait and mobility: Secondary | ICD-10-CM | POA: Diagnosis not present

## 2016-04-18 DIAGNOSIS — J449 Chronic obstructive pulmonary disease, unspecified: Secondary | ICD-10-CM | POA: Diagnosis not present

## 2016-04-18 DIAGNOSIS — J45909 Unspecified asthma, uncomplicated: Secondary | ICD-10-CM | POA: Diagnosis not present

## 2016-04-18 DIAGNOSIS — M356 Relapsing panniculitis [Weber-Christian]: Secondary | ICD-10-CM | POA: Diagnosis not present

## 2016-04-20 DIAGNOSIS — Z1389 Encounter for screening for other disorder: Secondary | ICD-10-CM | POA: Diagnosis not present

## 2016-04-20 DIAGNOSIS — G4733 Obstructive sleep apnea (adult) (pediatric): Secondary | ICD-10-CM | POA: Diagnosis not present

## 2016-04-20 DIAGNOSIS — E785 Hyperlipidemia, unspecified: Secondary | ICD-10-CM | POA: Diagnosis not present

## 2016-04-20 DIAGNOSIS — I119 Hypertensive heart disease without heart failure: Secondary | ICD-10-CM | POA: Diagnosis not present

## 2016-04-20 DIAGNOSIS — I10 Essential (primary) hypertension: Secondary | ICD-10-CM | POA: Diagnosis not present

## 2016-04-20 DIAGNOSIS — J45909 Unspecified asthma, uncomplicated: Secondary | ICD-10-CM | POA: Diagnosis not present

## 2016-04-20 DIAGNOSIS — I429 Cardiomyopathy, unspecified: Secondary | ICD-10-CM | POA: Diagnosis not present

## 2016-04-20 DIAGNOSIS — G4700 Insomnia, unspecified: Secondary | ICD-10-CM | POA: Diagnosis not present

## 2016-04-20 DIAGNOSIS — E119 Type 2 diabetes mellitus without complications: Secondary | ICD-10-CM | POA: Diagnosis not present

## 2016-04-22 DIAGNOSIS — J449 Chronic obstructive pulmonary disease, unspecified: Secondary | ICD-10-CM | POA: Diagnosis not present

## 2016-04-22 DIAGNOSIS — J45909 Unspecified asthma, uncomplicated: Secondary | ICD-10-CM | POA: Diagnosis not present

## 2016-04-25 ENCOUNTER — Other Ambulatory Visit: Payer: Self-pay

## 2016-04-25 VITALS — Wt 313.0 lb

## 2016-04-25 DIAGNOSIS — E1110 Type 2 diabetes mellitus with ketoacidosis without coma: Secondary | ICD-10-CM

## 2016-04-25 NOTE — Patient Outreach (Signed)
[facility]) Care Management  04/25/2016  Morgan Fischer [birth year] [id number]   Telephone contact with patient.  She reports she has been trying to limit carbohydrate intake, and has lost 3 pounds in the past month.  She reports visit with PA in PCP office last week.  A1C was not checked, but will be rechecked in June.  Fasting cbg this am reported at 134, weight down to 313.  Discussed patient's report of leg discomfort.  She states she has not been diagnosed with diabetic neuropathy, and thinks it may be arthritis.  Encouraged her to discuss her leg pain with pcp, and/or to return to an orthopedist for evaluation of leg/knee pain.  Patient was encouraged to start chair exercise program.  Plan:  Patient will continue efforts to adher to diabetic dietary guidelines.           Patient will review EMMI materials sent last month on chair exercises and diabetic diet.           RN will follow up in May.  Tyler Deis, RN, MSN RN [employer] [phone] Fax [phone]

## 2016-04-29 DIAGNOSIS — R269 Unspecified abnormalities of gait and mobility: Secondary | ICD-10-CM | POA: Diagnosis not present

## 2016-04-29 DIAGNOSIS — J449 Chronic obstructive pulmonary disease, unspecified: Secondary | ICD-10-CM | POA: Diagnosis not present

## 2016-04-29 DIAGNOSIS — J45909 Unspecified asthma, uncomplicated: Secondary | ICD-10-CM | POA: Diagnosis not present

## 2016-04-29 DIAGNOSIS — M356 Relapsing panniculitis [Weber-Christian]: Secondary | ICD-10-CM | POA: Diagnosis not present

## 2016-05-04 DIAGNOSIS — R269 Unspecified abnormalities of gait and mobility: Secondary | ICD-10-CM | POA: Diagnosis not present

## 2016-05-04 DIAGNOSIS — J449 Chronic obstructive pulmonary disease, unspecified: Secondary | ICD-10-CM | POA: Diagnosis not present

## 2016-05-04 DIAGNOSIS — M356 Relapsing panniculitis [Weber-Christian]: Secondary | ICD-10-CM | POA: Diagnosis not present

## 2016-05-04 DIAGNOSIS — J45909 Unspecified asthma, uncomplicated: Secondary | ICD-10-CM | POA: Diagnosis not present

## 2016-05-12 DIAGNOSIS — J45909 Unspecified asthma, uncomplicated: Secondary | ICD-10-CM | POA: Diagnosis not present

## 2016-05-12 DIAGNOSIS — J449 Chronic obstructive pulmonary disease, unspecified: Secondary | ICD-10-CM | POA: Diagnosis not present

## 2016-05-18 DIAGNOSIS — R269 Unspecified abnormalities of gait and mobility: Secondary | ICD-10-CM | POA: Diagnosis not present

## 2016-05-18 DIAGNOSIS — J449 Chronic obstructive pulmonary disease, unspecified: Secondary | ICD-10-CM | POA: Diagnosis not present

## 2016-05-18 DIAGNOSIS — J45909 Unspecified asthma, uncomplicated: Secondary | ICD-10-CM | POA: Diagnosis not present

## 2016-05-18 DIAGNOSIS — M356 Relapsing panniculitis [Weber-Christian]: Secondary | ICD-10-CM | POA: Diagnosis not present

## 2016-05-22 DIAGNOSIS — J45909 Unspecified asthma, uncomplicated: Secondary | ICD-10-CM | POA: Diagnosis not present

## 2016-05-22 DIAGNOSIS — J449 Chronic obstructive pulmonary disease, unspecified: Secondary | ICD-10-CM | POA: Diagnosis not present

## 2016-05-23 ENCOUNTER — Other Ambulatory Visit: Payer: Self-pay

## 2016-05-23 VITALS — Wt 319.0 lb

## 2016-05-23 DIAGNOSIS — E1110 Type 2 diabetes mellitus with ketoacidosis without coma: Secondary | ICD-10-CM

## 2016-05-23 NOTE — Patient Outreach (Signed)
[facility]N) Care Management  05/23/2016  Morgan BeachJulia Fischer 06/12/1950 [id number]   Telephone contact with patient.  She reports her fasting cbgs have stayed in the low 100s (101 today).  She continues to decrease carbohydrates in her diet, although she admits when she eats out it is hard to make healthy food choices. She states she received the EMMI materials on diet and chair exercises.  She is attempting to get into the habit of doing the exercises.  Patient reports she is using a topical rub on her legs at night, and that the leg pain is somewhat better.  Encouraged her to discuss with her PCP.  Patient states she has PCP appointment on 06-24-16, and thinks her A1C will be rechecked at that time.  Plan:  Patient will keep her PCP appointment.           Patient will limit carbohydrates in her diet.           Patient will use chair exercises to increase her activity level.           RN will follow up in June after her PCP appointment.  Tyler Deisonnie Shamar Kracke, RN, MSN RN [employer]Health Coach [facility] [phone] Fax [phone]

## 2016-05-29 DIAGNOSIS — J45909 Unspecified asthma, uncomplicated: Secondary | ICD-10-CM | POA: Diagnosis not present

## 2016-05-29 DIAGNOSIS — R269 Unspecified abnormalities of gait and mobility: Secondary | ICD-10-CM | POA: Diagnosis not present

## 2016-05-29 DIAGNOSIS — M356 Relapsing panniculitis [Weber-Christian]: Secondary | ICD-10-CM | POA: Diagnosis not present

## 2016-05-29 DIAGNOSIS — J449 Chronic obstructive pulmonary disease, unspecified: Secondary | ICD-10-CM | POA: Diagnosis not present

## 2016-05-30 ENCOUNTER — Other Ambulatory Visit: Payer: Self-pay | Admitting: Pharmacist

## 2016-05-30 NOTE — Patient Outreach (Signed)
[facility]N) Care Management  05/30/2016  Morgan BeachJulia Fischer 06/20/1950 [id number]   Called patient regarding medication assistance.  HIPAA identifiers were obtained.  Patient reported she had not ordered Lantus yet and was not sure where she was in terms of her TROOP (true out of pocket spending).  A preliminary financial assessment was done earlier this year that determined patient may be eligible for Glaxo's patient assistance program for Advair.  However, their program requires patients to spend at least $600 on medications.  Patient also appears to qualify for Eli Lilly's program to receive Trulicity and Hospital doctorBasaglar however, Lilly's program requires patients to spend at least $1100 out of pocket on medications.  [employer] was called. [employer] quoted the patient's TROOP as $327.47.  Patient requested Lantus be filled today for a 3 month supply.  Total cost for Lantus was $131.00.  Her new TROOP is $428.47.  Patient said she gets samples of Trulicity from her provider but has never had a prescription for Trulicity filled.  She also said she sometimes goes without therapy when the provider does not have samples.  A new prescription for a 30 day supply will be [facility] on the patient's behalf.   Plan:  1.  I will call the patient back in about a month to see where she is TROOP wise.  She also gets ipratropium/albuterol inhalation solution from [facility].  A request for a print out will be made [facility] as well.  If she has spent at least $600, an application for Glaxo can be completed.  2.  Route note to the patient's PCP to request a new prescription be sent to the patient's local pharmacy for Trulicity to cover her when she does not have access to samples.     Beecher McardleKatina J. Muaad Boehning, PharmD, BCACP [facility] Clinical Pharmacist [phone]

## 2016-06-12 DIAGNOSIS — J45909 Unspecified asthma, uncomplicated: Secondary | ICD-10-CM | POA: Diagnosis not present

## 2016-06-12 DIAGNOSIS — J449 Chronic obstructive pulmonary disease, unspecified: Secondary | ICD-10-CM | POA: Diagnosis not present

## 2016-06-15 DIAGNOSIS — B351 Tinea unguium: Secondary | ICD-10-CM | POA: Diagnosis not present

## 2016-06-15 DIAGNOSIS — M79671 Pain in right foot: Secondary | ICD-10-CM | POA: Diagnosis not present

## 2016-06-15 DIAGNOSIS — M79672 Pain in left foot: Secondary | ICD-10-CM | POA: Diagnosis not present

## 2016-06-15 DIAGNOSIS — E1159 Type 2 diabetes mellitus with other circulatory complications: Secondary | ICD-10-CM | POA: Diagnosis not present

## 2016-06-15 DIAGNOSIS — L84 Corns and callosities: Secondary | ICD-10-CM | POA: Diagnosis not present

## 2016-06-18 DIAGNOSIS — J45909 Unspecified asthma, uncomplicated: Secondary | ICD-10-CM | POA: Diagnosis not present

## 2016-06-18 DIAGNOSIS — J449 Chronic obstructive pulmonary disease, unspecified: Secondary | ICD-10-CM | POA: Diagnosis not present

## 2016-06-18 DIAGNOSIS — M356 Relapsing panniculitis [Weber-Christian]: Secondary | ICD-10-CM | POA: Diagnosis not present

## 2016-06-18 DIAGNOSIS — R269 Unspecified abnormalities of gait and mobility: Secondary | ICD-10-CM | POA: Diagnosis not present

## 2016-06-22 DIAGNOSIS — J449 Chronic obstructive pulmonary disease, unspecified: Secondary | ICD-10-CM | POA: Diagnosis not present

## 2016-06-22 DIAGNOSIS — J45909 Unspecified asthma, uncomplicated: Secondary | ICD-10-CM | POA: Diagnosis not present

## 2016-06-26 ENCOUNTER — Other Ambulatory Visit: Payer: Self-pay | Admitting: Pharmacist

## 2016-06-26 NOTE — Patient Outreach (Signed)
[facility]N) Care Management  06/26/2016  Morgan Fischer 10/29/1950 [id number]   Morgan Fischer is a 66 y.o. female with morbid obesity, diabetes, hypertension, hyperlipidemia on home oxygen with obesity hypoventilation syndrome, and chronic diastolic heart failure.  [facility] Pharmacists have been working with the patient since October 2018 on medication affordability.  Patient reported having issues affording Lantus, Trulicity and  Advair.  She may qualify for patient assistance from [employer] Lilly for [employer]rulicity after spending $1,100 on medications, Lantus from [employer]ofi after spending 5% of her income on medications, and Advair from [employer]axo after spending $600 on medications. Unfortunately, patient has only spent around $430 year-to-date.  If deemed therapeutically appropriate, Advair could be switched to [facility]era which is available from [facility]'s Patient Assistance program. The [employer] Program does not require an out-of-pocket spend on medications.  Patient has an appointment tomorrow with her pulmonologist, Dr. Eulis FosterBeauford.  A message will be sent to Dr. Eulis FosterBeauford requesting the therapeutic substitution and signature on the patient assistance form.  Patient was educated on how to download the Merck Patient Assistance Form from online and she will take the form with her to her appointment.    Plan: 1.  Route note to Dr. Eulis FosterBeauford.  2.  Follow up with the patient in 5-7 days  Beecher McardleKatina J. Tomorrow Dehaas, PharmD, [facility]ACP [facility] Clinical Pharmacist [phone]

## 2016-06-27 ENCOUNTER — Other Ambulatory Visit: Payer: Self-pay

## 2016-06-27 ENCOUNTER — Ambulatory Visit: Payer: Self-pay | Admitting: Pharmacist

## 2016-06-27 DIAGNOSIS — J45909 Unspecified asthma, uncomplicated: Secondary | ICD-10-CM | POA: Diagnosis not present

## 2016-06-27 DIAGNOSIS — I2781 Cor pulmonale (chronic): Secondary | ICD-10-CM | POA: Diagnosis not present

## 2016-06-27 DIAGNOSIS — G4730 Sleep apnea, unspecified: Secondary | ICD-10-CM | POA: Diagnosis not present

## 2016-06-27 DIAGNOSIS — R0602 Shortness of breath: Secondary | ICD-10-CM | POA: Diagnosis not present

## 2016-06-27 NOTE — Patient Outreach (Signed)
[facility]N) Care Management  06/27/2016  Hollie BeachJulia Kozloski 08/10/1950 [id number]   Unsuccessful attempt to reach patient by phone.  HIPAA appropriate message left with call back information.  If no response RN will make another attempt within 10 days.  Tyler Deisonnie Wasif Simonich, RN, MSN RN [employer] InternationalHealth Coach [facility] [phone] Fax [phone]

## 2016-06-29 ENCOUNTER — Other Ambulatory Visit: Payer: Self-pay

## 2016-06-29 DIAGNOSIS — I119 Hypertensive heart disease without heart failure: Secondary | ICD-10-CM | POA: Diagnosis not present

## 2016-06-29 DIAGNOSIS — J45909 Unspecified asthma, uncomplicated: Secondary | ICD-10-CM | POA: Diagnosis not present

## 2016-06-29 DIAGNOSIS — J449 Chronic obstructive pulmonary disease, unspecified: Secondary | ICD-10-CM | POA: Diagnosis not present

## 2016-06-29 DIAGNOSIS — Z1389 Encounter for screening for other disorder: Secondary | ICD-10-CM | POA: Diagnosis not present

## 2016-06-29 DIAGNOSIS — I10 Essential (primary) hypertension: Secondary | ICD-10-CM | POA: Diagnosis not present

## 2016-06-29 DIAGNOSIS — I429 Cardiomyopathy, unspecified: Secondary | ICD-10-CM | POA: Diagnosis not present

## 2016-06-29 DIAGNOSIS — G4733 Obstructive sleep apnea (adult) (pediatric): Secondary | ICD-10-CM | POA: Diagnosis not present

## 2016-06-29 DIAGNOSIS — E119 Type 2 diabetes mellitus without complications: Secondary | ICD-10-CM | POA: Diagnosis not present

## 2016-06-29 DIAGNOSIS — G4700 Insomnia, unspecified: Secondary | ICD-10-CM | POA: Diagnosis not present

## 2016-06-29 DIAGNOSIS — R269 Unspecified abnormalities of gait and mobility: Secondary | ICD-10-CM | POA: Diagnosis not present

## 2016-06-29 DIAGNOSIS — E785 Hyperlipidemia, unspecified: Secondary | ICD-10-CM | POA: Diagnosis not present

## 2016-06-29 DIAGNOSIS — M356 Relapsing panniculitis [Weber-Christian]: Secondary | ICD-10-CM | POA: Diagnosis not present

## 2016-06-29 NOTE — Patient Outreach (Signed)
[facility](THN) Care Management  06/29/2016  Hollie BeachJulia Eley 03/13/1950 [id number]   Unsuccessful attempt to reach patient.  HIPAA appropriate message left with call back information. If no response RN will make another attempt within 10 days.  Tyler Deisonnie Leron Stoffers, RN, MSN RN [employer] InternationalHealth Coach [facility] [phone] Fax [phone]

## 2016-06-30 ENCOUNTER — Other Ambulatory Visit: Payer: Self-pay | Admitting: Pharmacist

## 2016-06-30 NOTE — Patient Outreach (Signed)
[facility]N) Care Management  06/30/2016  Morgan BeachJulia Ureta 10/18/1950 [id number]   Patient was called to follow up on medication assistance with [facility]. HIPAA identifiers were obtained. Patient took an application for the [name] to her provider's office this week.  I also sent the provider, (Dr. Eulis FosterBeauford), a message about the application.  Patient reported Dr. Eulis FosterBeauford gave her samples of [name] and said he wanted her to try using it before switching to [name].  Patient reported having a one month supply of samples.  She did not leave the form with Dr. Eulis FosterBeauford.  Plan:  Follow up with the patient in 1 month.   Beecher McardleKatina J. Meghanne Pletz, PharmD, BCACP [facility] Clinical Pharmacist [phone]

## 2016-07-06 ENCOUNTER — Other Ambulatory Visit: Payer: Self-pay

## 2016-07-06 VITALS — Ht 63.5 in | Wt 316.0 lb

## 2016-07-06 DIAGNOSIS — E1110 Type 2 diabetes mellitus with ketoacidosis without coma: Secondary | ICD-10-CM

## 2016-07-06 NOTE — Patient Outreach (Signed)
[facility]N) Care Management  07/06/2016  Morgan BeachJulia Fischer 02/20/1950 [id number]   Monthly assessment completed.  Patient reports seeing her PCP in June, and that her A1C is down to 7.5 (down from a high of 10.2 in Sept 2017).  Patient reports focusing her diet on vegetables and protein.  Plan:  Patient will continue efforts to abide by diabetic and low salt dietary guidelines.           Patient will eat a small high protein bed-time snack to prevent early morning hypoglycemia.           Patient will implement daily routine of doing chair exercises for upper and lower extremities.           Patient will continue to monitor daily weights and recommended fluid restrictions.           RN will follow up in August.  Tyler Deisonnie Adolfo Granieri, RN, MSN RN Health [employer] [facility] [phone] Fax [phone]

## 2016-07-12 DIAGNOSIS — J45909 Unspecified asthma, uncomplicated: Secondary | ICD-10-CM | POA: Diagnosis not present

## 2016-07-12 DIAGNOSIS — J449 Chronic obstructive pulmonary disease, unspecified: Secondary | ICD-10-CM | POA: Diagnosis not present

## 2016-07-18 DIAGNOSIS — J449 Chronic obstructive pulmonary disease, unspecified: Secondary | ICD-10-CM | POA: Diagnosis not present

## 2016-07-18 DIAGNOSIS — M356 Relapsing panniculitis [Weber-Christian]: Secondary | ICD-10-CM | POA: Diagnosis not present

## 2016-07-18 DIAGNOSIS — R269 Unspecified abnormalities of gait and mobility: Secondary | ICD-10-CM | POA: Diagnosis not present

## 2016-07-18 DIAGNOSIS — J45909 Unspecified asthma, uncomplicated: Secondary | ICD-10-CM | POA: Diagnosis not present

## 2016-07-19 DIAGNOSIS — E872 Acidosis: Secondary | ICD-10-CM

## 2016-07-19 DIAGNOSIS — J9622 Acute and chronic respiratory failure with hypercapnia: Secondary | ICD-10-CM | POA: Diagnosis not present

## 2016-07-19 DIAGNOSIS — E1165 Type 2 diabetes mellitus with hyperglycemia: Secondary | ICD-10-CM | POA: Diagnosis not present

## 2016-07-19 DIAGNOSIS — I2510 Atherosclerotic heart disease of native coronary artery without angina pectoris: Secondary | ICD-10-CM | POA: Diagnosis not present

## 2016-07-19 DIAGNOSIS — J9602 Acute respiratory failure with hypercapnia: Secondary | ICD-10-CM | POA: Insufficient documentation

## 2016-07-19 DIAGNOSIS — I447 Left bundle-branch block, unspecified: Secondary | ICD-10-CM | POA: Diagnosis not present

## 2016-07-19 DIAGNOSIS — E784 Other hyperlipidemia: Secondary | ICD-10-CM | POA: Diagnosis not present

## 2016-07-19 DIAGNOSIS — R0603 Acute respiratory distress: Secondary | ICD-10-CM | POA: Insufficient documentation

## 2016-07-19 DIAGNOSIS — J9690 Respiratory failure, unspecified, unspecified whether with hypoxia or hypercapnia: Secondary | ICD-10-CM | POA: Diagnosis not present

## 2016-07-19 DIAGNOSIS — Z889 Allergy status to unspecified drugs, medicaments and biological substances status: Secondary | ICD-10-CM | POA: Diagnosis not present

## 2016-07-19 DIAGNOSIS — I252 Old myocardial infarction: Secondary | ICD-10-CM | POA: Diagnosis not present

## 2016-07-19 DIAGNOSIS — Z9911 Dependence on respirator [ventilator] status: Secondary | ICD-10-CM | POA: Diagnosis not present

## 2016-07-19 DIAGNOSIS — J9621 Acute and chronic respiratory failure with hypoxia: Secondary | ICD-10-CM | POA: Diagnosis not present

## 2016-07-19 DIAGNOSIS — E119 Type 2 diabetes mellitus without complications: Secondary | ICD-10-CM | POA: Diagnosis not present

## 2016-07-19 DIAGNOSIS — J441 Chronic obstructive pulmonary disease with (acute) exacerbation: Secondary | ICD-10-CM | POA: Diagnosis not present

## 2016-07-19 DIAGNOSIS — J449 Chronic obstructive pulmonary disease, unspecified: Secondary | ICD-10-CM | POA: Insufficient documentation

## 2016-07-19 DIAGNOSIS — I509 Heart failure, unspecified: Secondary | ICD-10-CM | POA: Insufficient documentation

## 2016-07-19 DIAGNOSIS — J45909 Unspecified asthma, uncomplicated: Secondary | ICD-10-CM | POA: Diagnosis not present

## 2016-07-19 DIAGNOSIS — I5033 Acute on chronic diastolic (congestive) heart failure: Secondary | ICD-10-CM | POA: Diagnosis not present

## 2016-07-19 DIAGNOSIS — R404 Transient alteration of awareness: Secondary | ICD-10-CM | POA: Diagnosis not present

## 2016-07-19 DIAGNOSIS — I350 Nonrheumatic aortic (valve) stenosis: Secondary | ICD-10-CM | POA: Diagnosis not present

## 2016-07-19 DIAGNOSIS — D72829 Elevated white blood cell count, unspecified: Secondary | ICD-10-CM

## 2016-07-19 DIAGNOSIS — I159 Secondary hypertension, unspecified: Secondary | ICD-10-CM | POA: Diagnosis not present

## 2016-07-19 DIAGNOSIS — E662 Morbid (severe) obesity with alveolar hypoventilation: Secondary | ICD-10-CM | POA: Diagnosis not present

## 2016-07-19 DIAGNOSIS — I5032 Chronic diastolic (congestive) heart failure: Secondary | ICD-10-CM | POA: Diagnosis not present

## 2016-07-19 DIAGNOSIS — Z4682 Encounter for fitting and adjustment of non-vascular catheter: Secondary | ICD-10-CM | POA: Diagnosis not present

## 2016-07-19 DIAGNOSIS — N390 Urinary tract infection, site not specified: Secondary | ICD-10-CM | POA: Diagnosis not present

## 2016-07-19 DIAGNOSIS — I517 Cardiomegaly: Secondary | ICD-10-CM | POA: Diagnosis not present

## 2016-07-19 DIAGNOSIS — Z6841 Body Mass Index (BMI) 40.0 and over, adult: Secondary | ICD-10-CM | POA: Diagnosis not present

## 2016-07-19 DIAGNOSIS — I110 Hypertensive heart disease with heart failure: Secondary | ICD-10-CM | POA: Diagnosis not present

## 2016-07-19 DIAGNOSIS — R0602 Shortness of breath: Secondary | ICD-10-CM | POA: Diagnosis not present

## 2016-07-19 HISTORY — DX: Heart failure, unspecified: I50.9

## 2016-07-19 HISTORY — DX: Elevated white blood cell count, unspecified: D72.829

## 2016-07-19 HISTORY — DX: Type 2 diabetes mellitus with hyperglycemia: E11.65

## 2016-07-19 HISTORY — DX: Acute respiratory distress: R06.03

## 2016-07-19 HISTORY — DX: Acidosis: E87.2

## 2016-07-19 HISTORY — DX: Acute respiratory failure with hypercapnia: J96.02

## 2016-07-21 DIAGNOSIS — J9602 Acute respiratory failure with hypercapnia: Secondary | ICD-10-CM | POA: Insufficient documentation

## 2016-07-21 DIAGNOSIS — Z889 Allergy status to unspecified drugs, medicaments and biological substances status: Secondary | ICD-10-CM | POA: Insufficient documentation

## 2016-07-21 DIAGNOSIS — Z6841 Body Mass Index (BMI) 40.0 and over, adult: Secondary | ICD-10-CM

## 2016-07-21 HISTORY — DX: Morbid (severe) obesity due to excess calories: E66.01

## 2016-07-21 HISTORY — DX: Acute respiratory failure with hypercapnia: J96.02

## 2016-07-21 HISTORY — DX: Allergy status to unspecified drugs, medicaments and biological substances: Z88.9

## 2016-07-21 HISTORY — DX: Body Mass Index (BMI) 40.0 and over, adult: Z684

## 2016-07-27 DIAGNOSIS — E785 Hyperlipidemia, unspecified: Secondary | ICD-10-CM | POA: Diagnosis not present

## 2016-07-27 DIAGNOSIS — E1165 Type 2 diabetes mellitus with hyperglycemia: Secondary | ICD-10-CM | POA: Diagnosis not present

## 2016-07-27 DIAGNOSIS — Z7982 Long term (current) use of aspirin: Secondary | ICD-10-CM | POA: Diagnosis not present

## 2016-07-27 DIAGNOSIS — I509 Heart failure, unspecified: Secondary | ICD-10-CM | POA: Diagnosis not present

## 2016-07-27 DIAGNOSIS — J441 Chronic obstructive pulmonary disease with (acute) exacerbation: Secondary | ICD-10-CM | POA: Diagnosis not present

## 2016-07-27 DIAGNOSIS — Z794 Long term (current) use of insulin: Secondary | ICD-10-CM | POA: Diagnosis not present

## 2016-07-27 DIAGNOSIS — I110 Hypertensive heart disease with heart failure: Secondary | ICD-10-CM | POA: Diagnosis not present

## 2016-07-28 ENCOUNTER — Other Ambulatory Visit: Payer: Self-pay | Admitting: Pharmacist

## 2016-07-28 NOTE — Patient Outreach (Signed)
[facility]) Care Management  [facility]   07/28/2016  Hollie BeachJulia Fye 09/21/1950 [id number]  Subjective: Patient was called to follow up on medication assistance.  HIPAA identifiers were obtained.  Patient is a 66 year old female with multiple medical conditions including but not limited to:  Hypertension, hyperlipidemia, type 2 diabetes, COPD, CHF , OSA (on BiPAP) and obesity.  [facility] for respiratory failure.  Patient reported feeling much better. She still manages her medications on her own and has medication affordability issues with Lantus, Trulicity and Advair.  She tried a trial of [name] to see if it would work for her before an application would be sent but she said she felt like [name] caused her heart to race.    Objective:   Encounter Medications: Outpatient Encounter Prescriptions as of 07/28/2016  Medication Sig  . ACCU-CHEK AVIVA PLUS test strip Inject 1 applicator into the skin as needed.  Marland Kitchen. allopurinol (ZYLOPRIM) 300 MG tablet Take 300 mg by mouth daily.  Marland Kitchen. amLODipine (NORVASC) 10 MG tablet Take 10 mg by mouth daily.  Marland Kitchen. aspirin 325 MG EC tablet Take 325 mg by mouth daily.  Marland Kitchen. atenolol (TENORMIN) 100 MG tablet Take 1 tablet (100 mg total) by mouth daily.  . Calcium Carb-Cholecalciferol (CALCIUM-VITAMIN D3) 500-400 MG-UNIT TABS Take 1 tablet by mouth 2 (two) times daily.  . Cyanocobalamin (VITAMIN B-12 PO) Take 1 tablet by mouth daily.  . Dulaglutide (TRULICITY) 0.75 MG/0.5ML SOPN Inject 1 application into the skin once a week.   . Fluticasone-Salmeterol (ADVAIR) 250-50 MCG/DOSE AEPB Inhale 1 puff into the lungs 2 (two) times daily.  . furosemide (LASIX) 40 MG tablet Take 1 tablet (40 mg total) by mouth 2 (two) times daily.  . Garlic 1000 MG CAPS Take 1,000 mg by mouth daily.  Marland Kitchen. glimepiride (AMARYL) 4 MG tablet Take 4 mg by mouth daily.   . hydrALAZINE (APRESOLINE) 25 MG tablet Take 25 mg by mouth every 8  (eight) hours.  Marland Kitchen. ibuprofen (ADVIL,MOTRIN) 600 MG tablet Take 1 tablet (600 mg total) by mouth every 6 (six) hours as needed.  Marland Kitchen. ipratropium-albuterol (DUONEB) 0.5-2.5 (3) MG/3ML SOLN Take 3 mLs by nebulization 2 (two) times daily.  Marland Kitchen. LANTUS SOLOSTAR 100 UNIT/ML Solostar Pen Inject 20 Units into the skin 2 (two) times daily.   Marland Kitchen. losartan (COZAAR) 50 MG tablet Take 50 mg by mouth daily.  . metFORMIN (GLUCOPHAGE) 1000 MG tablet Take 1,000 mg by mouth daily with breakfast.   . montelukast (SINGULAIR) 10 MG tablet Take 10 mg by mouth at bedtime.  . Multiple Vitamin (MULTIVITAMIN WITH MINERALS) TABS tablet Take 1 tablet by mouth daily.  . nitroGLYCERIN (NITROSTAT) 0.4 MG SL tablet Place 1 tablet (0.4 mg total) under the tongue every 5 (five) minutes as needed for chest pain.  . Omega-3 Fatty Acids (FISH OIL) 1000 MG CAPS Take 1,000 mg by mouth 2 (two) times daily.   . potassium chloride SA (K-DUR,KLOR-CON) 20 MEQ tablet Take 2 tablets (40 mEq total) by mouth 2 (two) times daily.  . rosuvastatin (CRESTOR) 40 MG tablet Take 40 mg by mouth daily.   No facility-administered encounter medications on file as of 07/28/2016.     Functional Status: In your present state of health, do you have any difficulty performing the following activities: 10/27/2015 10/13/2015  Hearing? N N  Vision? N N  Difficulty concentrating or making decisions? - N  Walking or climbing stairs? - Y  Dressing  or bathing? - N  Doing errands, shopping? - N  Quarry managerreparing Food and eating ? - N  Using the Toilet? - N  In the past six months, have you accidently leaked urine? - N  Do you have problems with loss of bowel control? - N  Managing your Medications? - N  Managing your Finances? - N  Housekeeping or managing your Housekeeping? - N  Some recent data might be hidden    Fall/Depression Screening: Fall Risk  07/06/2016 04/25/2016 03/29/2016  Falls in the past year? No No No  Risk for fall due to : Impaired mobility Impaired  mobility Impaired mobility  Risk for fall due to (comments): - - Education provided re fall prevention, use of cane as ambulatory aid.   PHQ 2/9 Scores 04/25/2016 10/12/2015 09/29/2015  PHQ - 2 Score 0 0 0      Assessment: Patient was recently discharged from hospital and all medications have been reviewed.   Drugs sorted by system:  Neurologic/Psychologic:   Cardiovascular: Rosuvastatin Nitroglycerin Losartan Hydralazine Furosemide Omega 3 Fatty Acids Aspirin Atenolol amlodipine  Pulmonary/Allergy: Montelukast Advair Ipratropium/albuterol nebulizer solution  Endocrine: Metformin Lantus Trulicity Glimepiride  Renal: allopurinol  Pain: Ibuprofen  Vitamins/Minerals: Cholecalciferol 1000 Potassium (dose discrepancy from discharge) Multiple vitamin Calcium Carbonate/Vitamin D Cyanocobalamin Garlic  New Medications started at discharge  Prednisone (Patient completed therapy) Cefdinir 300mg  (patient completed therapy)  Hydralazine 25mg --1 tablet every 8 hours-added to medication list  New Allergy post discharge: Atorvastatin-patient said she was given atorvastatin while hospitalized and had lip swelling form atorvastatin (added to allergy list)  Post Discharge Medication Findings:  Dose Discrepancy  Potassium-Discharge summary states Potassium 10 MEq 2 tablets daily (20 MEq daily)--Patient's medication list and what patient reported taking is Potassium Chloride 20 MEq 1 tablet twice daily  (40 MEq daily).  Patient's Potassium Level at discharge was 4.3.    Medication Assistance Findings: Applications will be mailed to the patient's home for Lantus, Advair and Lilly. Patient has already had a preliminary financial assessment that showed she was over income for Extra Help. It is unclear if she has spent at least $600 in medication expenses to qualify for Advair from Glaxo.  Her most recent EOB was $430.  Both Sanofi (Lantus) and Trulicity Magazine features editor(Lilly) require  patients to spend at least $1000 out of pocket on medication expenses.    Applications will be sent to the patient and she will gather the necessary financial documentation and the out of pocket expense information.  Plan: Route note to PCP and alert about Potassium dose  Route patient assistance letter to [facility] Pharmacy Technician, Daryll Brodrystal Walker for applications for Lantus, Advair and Trulicity to be sent to the patient.

## 2016-07-31 DIAGNOSIS — Z794 Long term (current) use of insulin: Secondary | ICD-10-CM | POA: Diagnosis not present

## 2016-07-31 DIAGNOSIS — Z7982 Long term (current) use of aspirin: Secondary | ICD-10-CM | POA: Diagnosis not present

## 2016-07-31 DIAGNOSIS — I110 Hypertensive heart disease with heart failure: Secondary | ICD-10-CM | POA: Diagnosis not present

## 2016-07-31 DIAGNOSIS — I509 Heart failure, unspecified: Secondary | ICD-10-CM | POA: Diagnosis not present

## 2016-07-31 DIAGNOSIS — J45909 Unspecified asthma, uncomplicated: Secondary | ICD-10-CM | POA: Diagnosis not present

## 2016-07-31 DIAGNOSIS — E1165 Type 2 diabetes mellitus with hyperglycemia: Secondary | ICD-10-CM | POA: Diagnosis not present

## 2016-07-31 DIAGNOSIS — E785 Hyperlipidemia, unspecified: Secondary | ICD-10-CM | POA: Diagnosis not present

## 2016-07-31 DIAGNOSIS — I2781 Cor pulmonale (chronic): Secondary | ICD-10-CM | POA: Diagnosis not present

## 2016-07-31 DIAGNOSIS — R0602 Shortness of breath: Secondary | ICD-10-CM | POA: Diagnosis not present

## 2016-07-31 DIAGNOSIS — G4730 Sleep apnea, unspecified: Secondary | ICD-10-CM | POA: Diagnosis not present

## 2016-07-31 DIAGNOSIS — J441 Chronic obstructive pulmonary disease with (acute) exacerbation: Secondary | ICD-10-CM | POA: Diagnosis not present

## 2016-08-01 ENCOUNTER — Encounter: Payer: Self-pay | Admitting: Pharmacy Technician

## 2016-08-01 DIAGNOSIS — I509 Heart failure, unspecified: Secondary | ICD-10-CM | POA: Diagnosis not present

## 2016-08-01 DIAGNOSIS — I110 Hypertensive heart disease with heart failure: Secondary | ICD-10-CM | POA: Diagnosis not present

## 2016-08-01 DIAGNOSIS — E785 Hyperlipidemia, unspecified: Secondary | ICD-10-CM | POA: Diagnosis not present

## 2016-08-01 DIAGNOSIS — J441 Chronic obstructive pulmonary disease with (acute) exacerbation: Secondary | ICD-10-CM | POA: Diagnosis not present

## 2016-08-01 DIAGNOSIS — E1165 Type 2 diabetes mellitus with hyperglycemia: Secondary | ICD-10-CM | POA: Diagnosis not present

## 2016-08-01 DIAGNOSIS — Z794 Long term (current) use of insulin: Secondary | ICD-10-CM | POA: Diagnosis not present

## 2016-08-01 DIAGNOSIS — Z7982 Long term (current) use of aspirin: Secondary | ICD-10-CM | POA: Diagnosis not present

## 2016-08-03 ENCOUNTER — Other Ambulatory Visit: Payer: Self-pay

## 2016-08-03 ENCOUNTER — Other Ambulatory Visit: Payer: Self-pay | Admitting: *Deleted

## 2016-08-03 VITALS — Wt 309.2 lb

## 2016-08-03 DIAGNOSIS — E785 Hyperlipidemia, unspecified: Secondary | ICD-10-CM | POA: Diagnosis not present

## 2016-08-03 DIAGNOSIS — E1110 Type 2 diabetes mellitus with ketoacidosis without coma: Secondary | ICD-10-CM

## 2016-08-03 DIAGNOSIS — J441 Chronic obstructive pulmonary disease with (acute) exacerbation: Secondary | ICD-10-CM | POA: Diagnosis not present

## 2016-08-03 DIAGNOSIS — I110 Hypertensive heart disease with heart failure: Secondary | ICD-10-CM | POA: Diagnosis not present

## 2016-08-03 DIAGNOSIS — Z7982 Long term (current) use of aspirin: Secondary | ICD-10-CM | POA: Diagnosis not present

## 2016-08-03 DIAGNOSIS — Z794 Long term (current) use of insulin: Secondary | ICD-10-CM | POA: Diagnosis not present

## 2016-08-03 DIAGNOSIS — E1165 Type 2 diabetes mellitus with hyperglycemia: Secondary | ICD-10-CM | POA: Diagnosis not present

## 2016-08-03 DIAGNOSIS — I509 Heart failure, unspecified: Secondary | ICD-10-CM | POA: Diagnosis not present

## 2016-08-03 NOTE — Patient Outreach (Signed)
[facility]) Care Management  08/03/2016  Morgan BeachJulia Fischer 04/21/1950 [id number]   RN received a referral today and followed up with this pt. Spoke with pt and introduced the [facility]. RN verified identifiers and purpose for today's call. Pt receptive and reviewed her discharge sheet and verified all her medications. States she will confirm dosage on her potassium currently take 20 meq but discharge indicates 10 meq. All other medical condition discussed as RN inquired on possible community home visits for a more one-on-one consultation. Pt appreciative but declined. RN offered to continue weekly transition of care calls over the next month informing pt that [facility] does not in any way interfere [facility] services (pt receptive to ongoing weekly transition of care call). Discussed a plan of care and goals with pt's approval and scheduled the next follow up call. Strongly encouraged pt to report any distress to the on call nurse with [facility] while active with this services however if symptoms are acute seek medical attention immediately ( pt with understanding). No further inquires or request at this time as RN will inform Remi Haggardonnie Rankins, RN (Telephonic) that pt will remain under community for ongoing transition of care calls per policy.   Patient was recently discharged from hospital and all medications have been reviewed.  Morgan CousinLisa Matthews, RN Care Management Coordinator [facility] Main Office [phone]

## 2016-08-03 NOTE — Patient Outreach (Signed)
[facility]) Care Management  [place]  08/03/2016   Morgan Fischer 07/23/1950 [id number]  Telephonic assessment with patient.  She reports she was hospitalized at [facility] from July 18th through July 24th with COPD exacerbation.  She states she has seen her pulmonologist since her discharge, and that she has an appointment with her cardiologist on August 13th.  Patient reports she does not feel she has completely recovered- she states her breathing is "pretty good" but that her balance is not steady.  Education on fall prevention strategies was provided.  Patient reports 2 episodes of early morning hypoglycemia this past week.  She drinks juice to bring her cbg up to normal.  Use of protein-rich bedtime snacks was reinforced.   Patient reports her weight was up when she went to the hospital due to fluid retention.  Reinforced low salt diet guidelines, and need to monitor daily weights for sudden increases.  Reviewed 'stop light' tool use for self-management of CHF.  Encounter Medications:  Outpatient Encounter Prescriptions as of 08/03/2016  Medication Sig  . ACCU-CHEK AVIVA PLUS test strip Inject 1 applicator into the skin as needed.  Marland Kitchen allopurinol (ZYLOPRIM) 300 MG tablet Take 300 mg by mouth daily.  Marland Kitchen amLODipine (NORVASC) 10 MG tablet Take 10 mg by mouth daily.  Marland Kitchen aspirin 325 MG EC tablet Take 325 mg by mouth daily.  Marland Kitchen atenolol (TENORMIN) 100 MG tablet Take 1 tablet (100 mg total) by mouth daily.  . Calcium Carb-Cholecalciferol (CALCIUM-VITAMIN D3) 500-400 MG-UNIT TABS Take 1 tablet by mouth 2 (two) times daily.  . Cholecalciferol 10000 units CAPS Take 1 capsule by mouth daily.  . Cyanocobalamin (VITAMIN B-12 PO) Take 1 tablet by mouth daily.  . Dulaglutide (TRULICITY) 7.37 TG/6.2IR SOPN Inject 1 application into the skin once a week.   . Fluticasone-Salmeterol (ADVAIR) 250-50 MCG/DOSE AEPB Inhale 1 puff into the lungs 2 (two) times daily.  .  furosemide (LASIX) 40 MG tablet Take 1 tablet (40 mg total) by mouth 2 (two) times daily.  . Garlic 4854 MG CAPS Take 1,000 mg by mouth daily.  Marland Kitchen glimepiride (AMARYL) 4 MG tablet Take 4 mg by mouth daily.   . hydrALAZINE (APRESOLINE) 25 MG tablet Take 25 mg by mouth every 8 (eight) hours.  Marland Kitchen ibuprofen (ADVIL,MOTRIN) 600 MG tablet Take 1 tablet (600 mg total) by mouth every 6 (six) hours as needed.  Marland Kitchen ipratropium-albuterol (DUONEB) 0.5-2.5 (3) MG/3ML SOLN Take 3 mLs by nebulization 2 (two) times daily.  Marland Kitchen LANTUS SOLOSTAR 100 UNIT/ML Solostar Pen Inject 20 Units into the skin 2 (two) times daily.   Marland Kitchen losartan (COZAAR) 50 MG tablet Take 50 mg by mouth daily.  . metFORMIN (GLUCOPHAGE) 1000 MG tablet Take 1,000 mg by mouth daily with breakfast.   . montelukast (SINGULAIR) 10 MG tablet Take 10 mg by mouth at bedtime.  . Multiple Vitamin (MULTIVITAMIN WITH MINERALS) TABS tablet Take 1 tablet by mouth daily.  . nitroGLYCERIN (NITROSTAT) 0.4 MG SL tablet Place 1 tablet (0.4 mg total) under the tongue every 5 (five) minutes as needed for chest pain.  . Omega-3 Fatty Acids (FISH OIL) 1000 MG CAPS Take 1,000 mg by mouth 2 (two) times daily.   . potassium chloride SA (K-DUR,KLOR-CON) 20 MEQ tablet Take 2 tablets (40 mEq total) by mouth 2 (two) times daily.  . rosuvastatin (CRESTOR) 40 MG tablet Take 40 mg by mouth daily.   No facility-administered encounter medications on file as of 08/03/2016.  Functional Status:  In your present state of health, do you have any difficulty performing the following activities: 10/27/2015 10/13/2015  Hearing? N N  Comment Has hearing loss, corrected with hearing aids. -  Vision? N N  Difficulty concentrating or making decisions? - N  Walking or climbing stairs? - Y  Comment - Uses cane in the home.  Has walker for outside.  Dressing or bathing? - N  Doing errands, shopping? - N  Conservation officer, nature and eating ? - N  Using the Toilet? - N  In the past six months, have  you accidently leaked urine? - N  Do you have problems with loss of bowel control? - N  Managing your Medications? - N  Managing your Finances? - N  Housekeeping or managing your Housekeeping? - N  Some recent data might be hidden    Fall/Depression Screening: Fall Risk  08/03/2016 07/06/2016 04/25/2016  Falls in the past year? No No No  Risk for fall due to : (No Data) Impaired mobility Impaired mobility  Risk for fall due to: Comment Education provided on fall preventions- sitting on side of bed before rising, using cane or walker, remove any clutter or loose rugs, use a night light. - -   PHQ 2/9 Scores 04/25/2016 10/12/2015 09/29/2015  PHQ - 2 Score 0 0 0   [facility] CM Care Plan Problem One     Most Recent Value  Care Plan Problem One  Self-Management needs related to Diabetes  Role Documenting the Problem One  [place] for Problem One  Active  THN Long Term Goal   Patient's A1C will drop from 10.2 to 8.2 within 90 days.  [facility]N Long Term Goal Start Date  10/12/15  [facility]N Long Term Goal Met Date  10/27/15  Interventions for Problem One Long Term Goal  Reinforced dietary guidelines.  [facility]N CM Short Term Goal #1   Episodes of hypoglycemia during the night will resolve within 30 days.  THN CM Short Term Goal #1 Start Date  07/06/16  Interventions for Short Term Goal #1  Reinforced need for protein rich bedtime snack.  THN CM Short Term Goal #2   Patient will be able to discuss 3 items to limit in her diet based on diabetic dietary guidelines within 30 days.  [facility] CM Short Term Goal #2 Start Date  10/12/15  [facility] CM Short Term Goal #2 Met Date  07/06/16  Interventions for Short Term Goal #2  Reinforced healthy dietary choices adhering to diabetic, low salt guidelines.    [facility] CM Care Plan Problem Two     Most Recent Value  Care Plan Problem Two  Self-management needs related to CHF.  Role Documenting the Problem Two  [place] for Problem Two  Active  THN CM Short Term  Goal #1   -- [Patient will adjust time of day for second dose of Lasix.]  [facility]N CM Short Term Goal #1 Start Date  01/27/16  Interventions for Short Term Goal #2   Reinforced recommendation to take her second dose of lasix in afternoon to decrease nocturia.  [facility]N CM Short Term Goal #2   Patient will use 'stop-light tool' to monitor CHF symptoms.  [facility]N CM Short Term Goal #2 Start Date  03/01/16  Interventions for Short Term Goal #2  Reinforced use of CHF Action Plan/stoplight tool.    [facility] CM Care Plan Problem Three     Most Recent Value  Care Plan Problem  Three   needs related to weight loss and activity level.  Role Documenting the Problem [place] for Problem Three  Active  THN Long Term Goal   Patient will lose 8 pounds in the next 90 days.  THN Long Term Goal Start Date  07/06/16  Interventions for Problem Three Long Term Goal  Reinforced role of diet and activity in weight loss.  THN CM Short Term Goal #1   Patient will implement daily chair exercises program.  [facility]N CM Short Term Goal #1 Start Date  07/06/16      Plan: Patient will contact PCP for follow-up appointment.           Patient will keep cardiology appointment scheduled for 08-14-16.           Patient will adher to low salt, diabetic dietary guidelines.           Patient will monitor/record daily weights and cbgs.           RN will send educational materials on low salt dietary guidelines.           RN will refer for transition of care.  Candie Mile, RN, MSN [place] [phone] Fax [phone]

## 2016-08-07 DIAGNOSIS — G4733 Obstructive sleep apnea (adult) (pediatric): Secondary | ICD-10-CM | POA: Diagnosis not present

## 2016-08-07 DIAGNOSIS — E785 Hyperlipidemia, unspecified: Secondary | ICD-10-CM | POA: Diagnosis not present

## 2016-08-07 DIAGNOSIS — I119 Hypertensive heart disease without heart failure: Secondary | ICD-10-CM | POA: Diagnosis not present

## 2016-08-07 DIAGNOSIS — Z1389 Encounter for screening for other disorder: Secondary | ICD-10-CM | POA: Diagnosis not present

## 2016-08-07 DIAGNOSIS — I429 Cardiomyopathy, unspecified: Secondary | ICD-10-CM | POA: Diagnosis not present

## 2016-08-07 DIAGNOSIS — J45909 Unspecified asthma, uncomplicated: Secondary | ICD-10-CM | POA: Diagnosis not present

## 2016-08-07 DIAGNOSIS — I10 Essential (primary) hypertension: Secondary | ICD-10-CM | POA: Diagnosis not present

## 2016-08-07 DIAGNOSIS — G4700 Insomnia, unspecified: Secondary | ICD-10-CM | POA: Diagnosis not present

## 2016-08-07 DIAGNOSIS — E119 Type 2 diabetes mellitus without complications: Secondary | ICD-10-CM | POA: Diagnosis not present

## 2016-08-08 DIAGNOSIS — I509 Heart failure, unspecified: Secondary | ICD-10-CM | POA: Diagnosis not present

## 2016-08-08 DIAGNOSIS — J441 Chronic obstructive pulmonary disease with (acute) exacerbation: Secondary | ICD-10-CM | POA: Diagnosis not present

## 2016-08-08 DIAGNOSIS — I110 Hypertensive heart disease with heart failure: Secondary | ICD-10-CM | POA: Diagnosis not present

## 2016-08-08 DIAGNOSIS — E1165 Type 2 diabetes mellitus with hyperglycemia: Secondary | ICD-10-CM | POA: Diagnosis not present

## 2016-08-08 DIAGNOSIS — Z7982 Long term (current) use of aspirin: Secondary | ICD-10-CM | POA: Diagnosis not present

## 2016-08-08 DIAGNOSIS — Z794 Long term (current) use of insulin: Secondary | ICD-10-CM | POA: Diagnosis not present

## 2016-08-08 DIAGNOSIS — E785 Hyperlipidemia, unspecified: Secondary | ICD-10-CM | POA: Diagnosis not present

## 2016-08-10 ENCOUNTER — Other Ambulatory Visit: Payer: Self-pay | Admitting: Pharmacist

## 2016-08-10 ENCOUNTER — Encounter: Payer: Self-pay | Admitting: Pharmacist

## 2016-08-10 DIAGNOSIS — Z7982 Long term (current) use of aspirin: Secondary | ICD-10-CM | POA: Diagnosis not present

## 2016-08-10 DIAGNOSIS — Z794 Long term (current) use of insulin: Secondary | ICD-10-CM | POA: Diagnosis not present

## 2016-08-10 DIAGNOSIS — E1165 Type 2 diabetes mellitus with hyperglycemia: Secondary | ICD-10-CM | POA: Diagnosis not present

## 2016-08-10 DIAGNOSIS — I110 Hypertensive heart disease with heart failure: Secondary | ICD-10-CM | POA: Diagnosis not present

## 2016-08-10 DIAGNOSIS — I509 Heart failure, unspecified: Secondary | ICD-10-CM | POA: Diagnosis not present

## 2016-08-10 DIAGNOSIS — E785 Hyperlipidemia, unspecified: Secondary | ICD-10-CM | POA: Diagnosis not present

## 2016-08-10 DIAGNOSIS — J441 Chronic obstructive pulmonary disease with (acute) exacerbation: Secondary | ICD-10-CM | POA: Diagnosis not present

## 2016-08-10 NOTE — Patient Outreach (Signed)
[facility]) Care Management  08/10/2016  Morgan BeachJulia Fischer 07/03/1950 [id number]   Called patient to follow up on medication assistance. HIPAA identifiers were obtained. Patient confirmed she received the patient assistance applications that were mailed to her but she had not looked at them.  Patient was reminded of the financial documentation that is needed to send with the applications. In addition to the financial documentation, the patient also needs her most recent EOB (estimation of benefits) from [facility].  Patient reported she has not received one as of yet. Her most recent out-of-pocket spend was $430.  [employer] requires an out -of-pocket spend of at least $600 and the companies that provide insulin and Trulicity require at least $1000.   Plan: Patient said she would call when her EOB is delivered. I will call patient back in 7-10 business days.  Morgan McardleKatina J. [name], PharmD, BCACP [facility] Clinical Pharmacist [phone]

## 2016-08-11 ENCOUNTER — Other Ambulatory Visit: Payer: Self-pay | Admitting: *Deleted

## 2016-08-11 NOTE — Patient Outreach (Signed)
[facility]) Care Management  08/11/2016  Hollie BeachJulia Blinn 12/29/1950 [id number]   Transition of care  RN spoke with pt today and verified pt identifiers. Pt reports she is doing well and confirmed her awareness of the COPD action plan. States she is in the GREEN zone once reviewed with this RN case Production designer, theatre/television/filmmanager. [facility] provided her with both the COPD action plan and the HF zones. RN discussed both stressing the importance of early detection and what to do if acute symptoms are presented. Pt states she is in the GREEN zone in both categories today with no precipitate symptoms. Will continue to encouraged ongoing adherence to the plan of care and her daily monitoring as a prevention measure.  Reviewed the plan of care as pt reports she has been taking all her prescribed medication and attending all required medical appointments since her discharge with sufficient transportation and support in the home as she continues to recover. RN continue to offer a home visit for a more one-on-one consultation for assisting pt in managing her care. Pt continues to decline however remains receptive to ongoing transition of care for another outreach call next week. Will schedule accordingly and follow.  Elliot CousinLisa Murle Hellstrom, RN Care Management Coordinator [facility] Main Office [phone]

## 2016-08-12 DIAGNOSIS — J45909 Unspecified asthma, uncomplicated: Secondary | ICD-10-CM | POA: Diagnosis not present

## 2016-08-12 DIAGNOSIS — J449 Chronic obstructive pulmonary disease, unspecified: Secondary | ICD-10-CM | POA: Diagnosis not present

## 2016-08-14 DIAGNOSIS — I10 Essential (primary) hypertension: Secondary | ICD-10-CM | POA: Diagnosis not present

## 2016-08-14 DIAGNOSIS — I5032 Chronic diastolic (congestive) heart failure: Secondary | ICD-10-CM | POA: Diagnosis not present

## 2016-08-14 DIAGNOSIS — R600 Localized edema: Secondary | ICD-10-CM | POA: Diagnosis not present

## 2016-08-14 DIAGNOSIS — I2510 Atherosclerotic heart disease of native coronary artery without angina pectoris: Secondary | ICD-10-CM | POA: Diagnosis not present

## 2016-08-14 DIAGNOSIS — I350 Nonrheumatic aortic (valve) stenosis: Secondary | ICD-10-CM | POA: Diagnosis not present

## 2016-08-14 DIAGNOSIS — E7800 Pure hypercholesterolemia, unspecified: Secondary | ICD-10-CM | POA: Diagnosis not present

## 2016-08-14 DIAGNOSIS — E119 Type 2 diabetes mellitus without complications: Secondary | ICD-10-CM | POA: Diagnosis not present

## 2016-08-15 DIAGNOSIS — J441 Chronic obstructive pulmonary disease with (acute) exacerbation: Secondary | ICD-10-CM | POA: Diagnosis not present

## 2016-08-15 DIAGNOSIS — Z7982 Long term (current) use of aspirin: Secondary | ICD-10-CM | POA: Diagnosis not present

## 2016-08-15 DIAGNOSIS — E1165 Type 2 diabetes mellitus with hyperglycemia: Secondary | ICD-10-CM | POA: Diagnosis not present

## 2016-08-15 DIAGNOSIS — I110 Hypertensive heart disease with heart failure: Secondary | ICD-10-CM | POA: Diagnosis not present

## 2016-08-15 DIAGNOSIS — E785 Hyperlipidemia, unspecified: Secondary | ICD-10-CM | POA: Diagnosis not present

## 2016-08-15 DIAGNOSIS — Z794 Long term (current) use of insulin: Secondary | ICD-10-CM | POA: Diagnosis not present

## 2016-08-15 DIAGNOSIS — I509 Heart failure, unspecified: Secondary | ICD-10-CM | POA: Diagnosis not present

## 2016-08-16 DIAGNOSIS — E785 Hyperlipidemia, unspecified: Secondary | ICD-10-CM | POA: Diagnosis not present

## 2016-08-16 DIAGNOSIS — Z794 Long term (current) use of insulin: Secondary | ICD-10-CM | POA: Diagnosis not present

## 2016-08-16 DIAGNOSIS — I110 Hypertensive heart disease with heart failure: Secondary | ICD-10-CM | POA: Diagnosis not present

## 2016-08-16 DIAGNOSIS — Z7982 Long term (current) use of aspirin: Secondary | ICD-10-CM | POA: Diagnosis not present

## 2016-08-16 DIAGNOSIS — I509 Heart failure, unspecified: Secondary | ICD-10-CM | POA: Diagnosis not present

## 2016-08-16 DIAGNOSIS — E1165 Type 2 diabetes mellitus with hyperglycemia: Secondary | ICD-10-CM | POA: Diagnosis not present

## 2016-08-16 DIAGNOSIS — J441 Chronic obstructive pulmonary disease with (acute) exacerbation: Secondary | ICD-10-CM | POA: Diagnosis not present

## 2016-08-18 ENCOUNTER — Encounter: Payer: Self-pay | Admitting: *Deleted

## 2016-08-18 ENCOUNTER — Other Ambulatory Visit: Payer: Self-pay | Admitting: *Deleted

## 2016-08-18 ENCOUNTER — Ambulatory Visit: Payer: Self-pay | Admitting: *Deleted

## 2016-08-18 DIAGNOSIS — E1165 Type 2 diabetes mellitus with hyperglycemia: Secondary | ICD-10-CM | POA: Diagnosis not present

## 2016-08-18 DIAGNOSIS — Z7982 Long term (current) use of aspirin: Secondary | ICD-10-CM | POA: Diagnosis not present

## 2016-08-18 DIAGNOSIS — J449 Chronic obstructive pulmonary disease, unspecified: Secondary | ICD-10-CM | POA: Diagnosis not present

## 2016-08-18 DIAGNOSIS — Z794 Long term (current) use of insulin: Secondary | ICD-10-CM | POA: Diagnosis not present

## 2016-08-18 DIAGNOSIS — J45909 Unspecified asthma, uncomplicated: Secondary | ICD-10-CM | POA: Diagnosis not present

## 2016-08-18 DIAGNOSIS — R269 Unspecified abnormalities of gait and mobility: Secondary | ICD-10-CM | POA: Diagnosis not present

## 2016-08-18 DIAGNOSIS — E785 Hyperlipidemia, unspecified: Secondary | ICD-10-CM | POA: Diagnosis not present

## 2016-08-18 DIAGNOSIS — I110 Hypertensive heart disease with heart failure: Secondary | ICD-10-CM | POA: Diagnosis not present

## 2016-08-18 DIAGNOSIS — M356 Relapsing panniculitis [Weber-Christian]: Secondary | ICD-10-CM | POA: Diagnosis not present

## 2016-08-18 DIAGNOSIS — I509 Heart failure, unspecified: Secondary | ICD-10-CM | POA: Diagnosis not present

## 2016-08-18 DIAGNOSIS — J441 Chronic obstructive pulmonary disease with (acute) exacerbation: Secondary | ICD-10-CM | POA: Diagnosis not present

## 2016-08-18 NOTE — Patient Outreach (Signed)
[facility]) Care Management  08/18/2016  Morgan Fischer [birth year] [id number]   RN spoke with pt today and verified identifiers and purpose again for today call. RN reintroduced the [facility] program with ongoing transition of care calls. Inquired on pt's ongoing management of care related to her COPD. Pt states she remains in the GREEN zone today with no symptoms however reports some weight gain with fluid retention and her provider recommended extra Lasix for 3 days with good results. States she is breathing "okay" with weight yesterday at 313.2, today 314.2 and last week 315 with a little swelling of fluid around her ankles but the [facility] nurse is monitoring this issue. RN reiterated on the symptoms of HF zones and COPD action plan as pt verified she remains in the GREEN zone with both with no breathing problems to report at this time. Note RN offered again to visit for a more one-on-one education but pt feels she is making progress and receptive to the ongoing telephonic transition of care. RN finished the initial assessment and review the plan of care and goals in place as discussed on the previous call (pt remains receptive). Will encouraged adherence and verify pt continues to have sufficient transportation to all her medical appointments and taking all her medications as prescribed with no delays. Note [facility] pharmacy Nunzio Cobbs) continues to work with pt concerning her medications. Offered to follow up telephone next week as pt receptive and again appreciative for the call today. Will alert pt of pt's disposition with [facility] for transition of care contacts at this time.  Elliot Cousin, RN Care Management Coordinator [facility] Main Office [phone]

## 2016-08-21 ENCOUNTER — Other Ambulatory Visit: Payer: Self-pay | Admitting: Pharmacist

## 2016-08-21 DIAGNOSIS — Z794 Long term (current) use of insulin: Secondary | ICD-10-CM | POA: Diagnosis not present

## 2016-08-21 DIAGNOSIS — J441 Chronic obstructive pulmonary disease with (acute) exacerbation: Secondary | ICD-10-CM | POA: Diagnosis not present

## 2016-08-21 DIAGNOSIS — Z7982 Long term (current) use of aspirin: Secondary | ICD-10-CM | POA: Diagnosis not present

## 2016-08-21 DIAGNOSIS — I110 Hypertensive heart disease with heart failure: Secondary | ICD-10-CM | POA: Diagnosis not present

## 2016-08-21 DIAGNOSIS — I509 Heart failure, unspecified: Secondary | ICD-10-CM | POA: Diagnosis not present

## 2016-08-21 DIAGNOSIS — E1165 Type 2 diabetes mellitus with hyperglycemia: Secondary | ICD-10-CM | POA: Diagnosis not present

## 2016-08-21 DIAGNOSIS — E785 Hyperlipidemia, unspecified: Secondary | ICD-10-CM | POA: Diagnosis not present

## 2016-08-21 NOTE — Patient Outreach (Signed)
[facility]) Care Management  08/21/2016  Morgan Fischer 07-31-1950 [id number]   Called patient to follow up on medication assistance. HIPAA identifiers were obtained.  Patient confirmed she completed and mailed back the patient assistance forms that were sent to her as well as the required financial documentation and the first two pages of her statement from [facility].  Patient stated her documented out of pocket spend from [facility] was $467. To qualify for patient assistance through [employer], patient needs to spend $600 on medication expenses.   Once received in the [facility] office, forms will be processed by [facility] Pharmacy Technician, Daryll Brod.   Plan: Patient instructed to call when she spends more money on medications and reaches the $600 out of pocket requirement.  Follow up with the patient in 30 days to see if her medication expenses have reached $600  [name], PharmD, [facility] Clinical Pharmacist [phone]

## 2016-08-22 DIAGNOSIS — I509 Heart failure, unspecified: Secondary | ICD-10-CM | POA: Diagnosis not present

## 2016-08-22 DIAGNOSIS — E785 Hyperlipidemia, unspecified: Secondary | ICD-10-CM | POA: Diagnosis not present

## 2016-08-22 DIAGNOSIS — J449 Chronic obstructive pulmonary disease, unspecified: Secondary | ICD-10-CM | POA: Diagnosis not present

## 2016-08-22 DIAGNOSIS — J45909 Unspecified asthma, uncomplicated: Secondary | ICD-10-CM | POA: Diagnosis not present

## 2016-08-22 DIAGNOSIS — Z7982 Long term (current) use of aspirin: Secondary | ICD-10-CM | POA: Diagnosis not present

## 2016-08-22 DIAGNOSIS — E1165 Type 2 diabetes mellitus with hyperglycemia: Secondary | ICD-10-CM | POA: Diagnosis not present

## 2016-08-22 DIAGNOSIS — J441 Chronic obstructive pulmonary disease with (acute) exacerbation: Secondary | ICD-10-CM | POA: Diagnosis not present

## 2016-08-22 DIAGNOSIS — Z794 Long term (current) use of insulin: Secondary | ICD-10-CM | POA: Diagnosis not present

## 2016-08-22 DIAGNOSIS — I110 Hypertensive heart disease with heart failure: Secondary | ICD-10-CM | POA: Diagnosis not present

## 2016-08-23 ENCOUNTER — Other Ambulatory Visit: Payer: Self-pay | Admitting: *Deleted

## 2016-08-23 NOTE — Patient Outreach (Signed)
[facility]) Care Management  08/23/2016  Morgan Fischer 1950/01/24 [id number]   RN attempted outreach however unsuccessful. RN able to leave a HIPAA approved voice message requesting a call back. Will continue to follow up accordingly and contact pt again next week for ongoing transition of care.  Elliot Cousin, RN Care Management Coordinator [facility] Main Office [phone]

## 2016-08-25 DIAGNOSIS — Z794 Long term (current) use of insulin: Secondary | ICD-10-CM | POA: Diagnosis not present

## 2016-08-25 DIAGNOSIS — I110 Hypertensive heart disease with heart failure: Secondary | ICD-10-CM | POA: Diagnosis not present

## 2016-08-25 DIAGNOSIS — I509 Heart failure, unspecified: Secondary | ICD-10-CM | POA: Diagnosis not present

## 2016-08-25 DIAGNOSIS — J441 Chronic obstructive pulmonary disease with (acute) exacerbation: Secondary | ICD-10-CM | POA: Diagnosis not present

## 2016-08-25 DIAGNOSIS — E785 Hyperlipidemia, unspecified: Secondary | ICD-10-CM | POA: Diagnosis not present

## 2016-08-25 DIAGNOSIS — E1165 Type 2 diabetes mellitus with hyperglycemia: Secondary | ICD-10-CM | POA: Diagnosis not present

## 2016-08-25 DIAGNOSIS — Z7982 Long term (current) use of aspirin: Secondary | ICD-10-CM | POA: Diagnosis not present

## 2016-08-28 DIAGNOSIS — I10 Essential (primary) hypertension: Secondary | ICD-10-CM | POA: Diagnosis not present

## 2016-08-28 DIAGNOSIS — J45909 Unspecified asthma, uncomplicated: Secondary | ICD-10-CM | POA: Diagnosis not present

## 2016-08-28 DIAGNOSIS — E119 Type 2 diabetes mellitus without complications: Secondary | ICD-10-CM | POA: Diagnosis not present

## 2016-08-28 DIAGNOSIS — I429 Cardiomyopathy, unspecified: Secondary | ICD-10-CM | POA: Diagnosis not present

## 2016-08-28 DIAGNOSIS — E785 Hyperlipidemia, unspecified: Secondary | ICD-10-CM | POA: Diagnosis not present

## 2016-08-28 DIAGNOSIS — I119 Hypertensive heart disease without heart failure: Secondary | ICD-10-CM | POA: Diagnosis not present

## 2016-08-28 DIAGNOSIS — G4733 Obstructive sleep apnea (adult) (pediatric): Secondary | ICD-10-CM | POA: Diagnosis not present

## 2016-08-28 DIAGNOSIS — G4700 Insomnia, unspecified: Secondary | ICD-10-CM | POA: Diagnosis not present

## 2016-08-30 ENCOUNTER — Other Ambulatory Visit: Payer: Self-pay | Admitting: *Deleted

## 2016-08-30 DIAGNOSIS — E1165 Type 2 diabetes mellitus with hyperglycemia: Secondary | ICD-10-CM | POA: Diagnosis not present

## 2016-08-30 DIAGNOSIS — I110 Hypertensive heart disease with heart failure: Secondary | ICD-10-CM | POA: Diagnosis not present

## 2016-08-30 DIAGNOSIS — J441 Chronic obstructive pulmonary disease with (acute) exacerbation: Secondary | ICD-10-CM | POA: Diagnosis not present

## 2016-08-30 DIAGNOSIS — Z794 Long term (current) use of insulin: Secondary | ICD-10-CM | POA: Diagnosis not present

## 2016-08-30 DIAGNOSIS — E785 Hyperlipidemia, unspecified: Secondary | ICD-10-CM | POA: Diagnosis not present

## 2016-08-30 DIAGNOSIS — I509 Heart failure, unspecified: Secondary | ICD-10-CM | POA: Diagnosis not present

## 2016-08-30 DIAGNOSIS — Z7982 Long term (current) use of aspirin: Secondary | ICD-10-CM | POA: Diagnosis not present

## 2016-08-30 NOTE — Patient Outreach (Signed)
[facility]) Care Management  08/30/2016  Ashiana Starlin 1950/12/10 [id number]   RN attempted once again outreach call to pt however unsuccessful. RN able to leave a HIPAA approved voice message requesting a call back. Will inquire further on pt's ongoing management of care regarding her COPD. Will rescheduled next follow up call.  Elliot Cousin, RN Care Management Coordinator [facility] Main Office [phone]

## 2016-08-30 NOTE — Patient Outreach (Signed)
[facility]) Care Management  08/30/2016  Morgan BeachJulia Lisenby 08/10/1950 [id number]   RN received a call back from pt via voice message on 8/23 indicating she is doing well with no major issues.   Elliot CousinLisa Tayshon Winker, RN Care Management Coordinator [facility] Main Office [phone]

## 2016-08-30 NOTE — Patient Outreach (Signed)
[facility]) Care Management  08/30/2016  Morgan Fischer Oct 16, 1950 [id number]   RN received a call from pt indicating she is doing well with no problems. States she is in the GREEN zone with no precipitating symptoms. Pt continues agree to ongoing transition of care contacts and does not wish to have home visits at this time due to her progress. RN reviewed the plan of care and goals as pt continues to reports her adherence with her medical appointments and taking all her medications as prescribed. No other request or inquires at this time as RN will continue to encourage pt's with her ongoing management of care and adherence to the plan of care. No needed community resources at this time. RN will continue to follow up weekly with her transition of care contacts per policy.   Elliot Cousin, RN Care Management Coordinator [facility] Main Office [phone]

## 2016-09-01 DIAGNOSIS — E1165 Type 2 diabetes mellitus with hyperglycemia: Secondary | ICD-10-CM | POA: Diagnosis not present

## 2016-09-01 DIAGNOSIS — J441 Chronic obstructive pulmonary disease with (acute) exacerbation: Secondary | ICD-10-CM | POA: Diagnosis not present

## 2016-09-01 DIAGNOSIS — E785 Hyperlipidemia, unspecified: Secondary | ICD-10-CM | POA: Diagnosis not present

## 2016-09-01 DIAGNOSIS — I110 Hypertensive heart disease with heart failure: Secondary | ICD-10-CM | POA: Diagnosis not present

## 2016-09-01 DIAGNOSIS — I509 Heart failure, unspecified: Secondary | ICD-10-CM | POA: Diagnosis not present

## 2016-09-01 DIAGNOSIS — Z794 Long term (current) use of insulin: Secondary | ICD-10-CM | POA: Diagnosis not present

## 2016-09-01 DIAGNOSIS — Z7982 Long term (current) use of aspirin: Secondary | ICD-10-CM | POA: Diagnosis not present

## 2016-09-05 ENCOUNTER — Other Ambulatory Visit: Payer: Self-pay | Admitting: *Deleted

## 2016-09-05 DIAGNOSIS — I110 Hypertensive heart disease with heart failure: Secondary | ICD-10-CM | POA: Diagnosis not present

## 2016-09-05 DIAGNOSIS — Z7982 Long term (current) use of aspirin: Secondary | ICD-10-CM | POA: Diagnosis not present

## 2016-09-05 DIAGNOSIS — J441 Chronic obstructive pulmonary disease with (acute) exacerbation: Secondary | ICD-10-CM | POA: Diagnosis not present

## 2016-09-05 DIAGNOSIS — E785 Hyperlipidemia, unspecified: Secondary | ICD-10-CM | POA: Diagnosis not present

## 2016-09-05 DIAGNOSIS — I509 Heart failure, unspecified: Secondary | ICD-10-CM | POA: Diagnosis not present

## 2016-09-05 DIAGNOSIS — Z794 Long term (current) use of insulin: Secondary | ICD-10-CM | POA: Diagnosis not present

## 2016-09-05 DIAGNOSIS — E1165 Type 2 diabetes mellitus with hyperglycemia: Secondary | ICD-10-CM | POA: Diagnosis not present

## 2016-09-05 NOTE — Patient Outreach (Signed)
[facility]) Care Management  09/05/2016  Hollie BeachJulia Kalata 03/30/1950 [id number]   Transition of care follow up  RN spoke with pt today and confirmed identifiers and confirmed pt remains in the GREEN zone with her COPD. RN review the plan of care and reiterate on the goals and confirmed pt's adherence with her ongoing medications and medical appointments over the last 30 days. Pt reports ongoing involvement with [facility] for PT and nursing services with no major issues at this time. RN continues to extend community home visits and any needed resources if needed. Due to no acceptance for home visits RN offered to follow up one additional time next month in completing the transiioin of care contacts (pt receptive). Will determine at that time if pt remains adherent to her plan of care with no encountered problems and discharge pt accordingly based upon her management of care. Will update her provider at that time of pt's progress and [facility] and services.  Elliot CousinLisa Caliah Kopke, RN Care Management Coordinator [facility] Main Office [phone]

## 2016-09-07 DIAGNOSIS — L84 Corns and callosities: Secondary | ICD-10-CM | POA: Diagnosis not present

## 2016-09-07 DIAGNOSIS — B351 Tinea unguium: Secondary | ICD-10-CM | POA: Diagnosis not present

## 2016-09-07 DIAGNOSIS — E1159 Type 2 diabetes mellitus with other circulatory complications: Secondary | ICD-10-CM | POA: Diagnosis not present

## 2016-09-07 DIAGNOSIS — M79672 Pain in left foot: Secondary | ICD-10-CM | POA: Diagnosis not present

## 2016-09-07 DIAGNOSIS — M79671 Pain in right foot: Secondary | ICD-10-CM | POA: Diagnosis not present

## 2016-09-12 DIAGNOSIS — I509 Heart failure, unspecified: Secondary | ICD-10-CM | POA: Diagnosis not present

## 2016-09-12 DIAGNOSIS — J45909 Unspecified asthma, uncomplicated: Secondary | ICD-10-CM | POA: Diagnosis not present

## 2016-09-12 DIAGNOSIS — E1165 Type 2 diabetes mellitus with hyperglycemia: Secondary | ICD-10-CM | POA: Diagnosis not present

## 2016-09-12 DIAGNOSIS — R0602 Shortness of breath: Secondary | ICD-10-CM | POA: Diagnosis not present

## 2016-09-12 DIAGNOSIS — J441 Chronic obstructive pulmonary disease with (acute) exacerbation: Secondary | ICD-10-CM | POA: Diagnosis not present

## 2016-09-12 DIAGNOSIS — Z794 Long term (current) use of insulin: Secondary | ICD-10-CM | POA: Diagnosis not present

## 2016-09-12 DIAGNOSIS — E785 Hyperlipidemia, unspecified: Secondary | ICD-10-CM | POA: Diagnosis not present

## 2016-09-12 DIAGNOSIS — I110 Hypertensive heart disease with heart failure: Secondary | ICD-10-CM | POA: Diagnosis not present

## 2016-09-12 DIAGNOSIS — G4730 Sleep apnea, unspecified: Secondary | ICD-10-CM | POA: Diagnosis not present

## 2016-09-12 DIAGNOSIS — J449 Chronic obstructive pulmonary disease, unspecified: Secondary | ICD-10-CM | POA: Diagnosis not present

## 2016-09-12 DIAGNOSIS — I2781 Cor pulmonale (chronic): Secondary | ICD-10-CM | POA: Diagnosis not present

## 2016-09-12 DIAGNOSIS — Z7982 Long term (current) use of aspirin: Secondary | ICD-10-CM | POA: Diagnosis not present

## 2016-09-18 DIAGNOSIS — I110 Hypertensive heart disease with heart failure: Secondary | ICD-10-CM | POA: Diagnosis not present

## 2016-09-18 DIAGNOSIS — R269 Unspecified abnormalities of gait and mobility: Secondary | ICD-10-CM | POA: Diagnosis not present

## 2016-09-18 DIAGNOSIS — J449 Chronic obstructive pulmonary disease, unspecified: Secondary | ICD-10-CM | POA: Diagnosis not present

## 2016-09-18 DIAGNOSIS — E785 Hyperlipidemia, unspecified: Secondary | ICD-10-CM | POA: Diagnosis not present

## 2016-09-18 DIAGNOSIS — E1165 Type 2 diabetes mellitus with hyperglycemia: Secondary | ICD-10-CM | POA: Diagnosis not present

## 2016-09-18 DIAGNOSIS — M356 Relapsing panniculitis [Weber-Christian]: Secondary | ICD-10-CM | POA: Diagnosis not present

## 2016-09-18 DIAGNOSIS — Z7982 Long term (current) use of aspirin: Secondary | ICD-10-CM | POA: Diagnosis not present

## 2016-09-18 DIAGNOSIS — J441 Chronic obstructive pulmonary disease with (acute) exacerbation: Secondary | ICD-10-CM | POA: Diagnosis not present

## 2016-09-18 DIAGNOSIS — J45909 Unspecified asthma, uncomplicated: Secondary | ICD-10-CM | POA: Diagnosis not present

## 2016-09-18 DIAGNOSIS — I509 Heart failure, unspecified: Secondary | ICD-10-CM | POA: Diagnosis not present

## 2016-09-18 DIAGNOSIS — Z794 Long term (current) use of insulin: Secondary | ICD-10-CM | POA: Diagnosis not present

## 2016-09-20 DIAGNOSIS — J45909 Unspecified asthma, uncomplicated: Secondary | ICD-10-CM | POA: Diagnosis not present

## 2016-09-20 DIAGNOSIS — M356 Relapsing panniculitis [Weber-Christian]: Secondary | ICD-10-CM | POA: Diagnosis not present

## 2016-09-20 DIAGNOSIS — J449 Chronic obstructive pulmonary disease, unspecified: Secondary | ICD-10-CM | POA: Diagnosis not present

## 2016-09-20 DIAGNOSIS — R269 Unspecified abnormalities of gait and mobility: Secondary | ICD-10-CM | POA: Diagnosis not present

## 2016-09-21 DIAGNOSIS — E785 Hyperlipidemia, unspecified: Secondary | ICD-10-CM | POA: Diagnosis not present

## 2016-09-21 DIAGNOSIS — I509 Heart failure, unspecified: Secondary | ICD-10-CM | POA: Diagnosis not present

## 2016-09-21 DIAGNOSIS — Z794 Long term (current) use of insulin: Secondary | ICD-10-CM | POA: Diagnosis not present

## 2016-09-21 DIAGNOSIS — J441 Chronic obstructive pulmonary disease with (acute) exacerbation: Secondary | ICD-10-CM | POA: Diagnosis not present

## 2016-09-21 DIAGNOSIS — I110 Hypertensive heart disease with heart failure: Secondary | ICD-10-CM | POA: Diagnosis not present

## 2016-09-21 DIAGNOSIS — E1165 Type 2 diabetes mellitus with hyperglycemia: Secondary | ICD-10-CM | POA: Diagnosis not present

## 2016-09-21 DIAGNOSIS — Z7982 Long term (current) use of aspirin: Secondary | ICD-10-CM | POA: Diagnosis not present

## 2016-09-22 ENCOUNTER — Other Ambulatory Visit: Payer: Self-pay | Admitting: Pharmacy Technician

## 2016-09-22 DIAGNOSIS — J449 Chronic obstructive pulmonary disease, unspecified: Secondary | ICD-10-CM | POA: Diagnosis not present

## 2016-09-22 DIAGNOSIS — J45909 Unspecified asthma, uncomplicated: Secondary | ICD-10-CM | POA: Diagnosis not present

## 2016-09-22 NOTE — Patient Outreach (Signed)
[facility]) Care Management  09/22/2016  Morgan Fischer 1950-12-09 [id number]  I contacted [name] today to follow-up on several patient assistance application's that we are working on. As of 09/06/2016 the patient had not spent enough out of pocket to apply for any of the programs that she has inquired about. As per our discussion she still has not spent enough and she will contact me once she has.  Daryll Brod, CPhT [facility] [employer] [phone]

## 2016-09-25 ENCOUNTER — Ambulatory Visit: Payer: Self-pay | Admitting: Pharmacist

## 2016-09-26 DIAGNOSIS — E1165 Type 2 diabetes mellitus with hyperglycemia: Secondary | ICD-10-CM | POA: Diagnosis not present

## 2016-09-26 DIAGNOSIS — Z7982 Long term (current) use of aspirin: Secondary | ICD-10-CM | POA: Diagnosis not present

## 2016-09-26 DIAGNOSIS — J441 Chronic obstructive pulmonary disease with (acute) exacerbation: Secondary | ICD-10-CM | POA: Diagnosis not present

## 2016-09-26 DIAGNOSIS — I509 Heart failure, unspecified: Secondary | ICD-10-CM | POA: Diagnosis not present

## 2016-09-26 DIAGNOSIS — Z794 Long term (current) use of insulin: Secondary | ICD-10-CM | POA: Diagnosis not present

## 2016-09-26 DIAGNOSIS — E785 Hyperlipidemia, unspecified: Secondary | ICD-10-CM | POA: Diagnosis not present

## 2016-09-26 DIAGNOSIS — I110 Hypertensive heart disease with heart failure: Secondary | ICD-10-CM | POA: Diagnosis not present

## 2016-09-27 DIAGNOSIS — I110 Hypertensive heart disease with heart failure: Secondary | ICD-10-CM | POA: Diagnosis not present

## 2016-09-27 DIAGNOSIS — I509 Heart failure, unspecified: Secondary | ICD-10-CM | POA: Diagnosis not present

## 2016-09-27 DIAGNOSIS — Z7982 Long term (current) use of aspirin: Secondary | ICD-10-CM | POA: Diagnosis not present

## 2016-09-27 DIAGNOSIS — Z794 Long term (current) use of insulin: Secondary | ICD-10-CM | POA: Diagnosis not present

## 2016-09-27 DIAGNOSIS — E785 Hyperlipidemia, unspecified: Secondary | ICD-10-CM | POA: Diagnosis not present

## 2016-09-27 DIAGNOSIS — J441 Chronic obstructive pulmonary disease with (acute) exacerbation: Secondary | ICD-10-CM | POA: Diagnosis not present

## 2016-09-27 DIAGNOSIS — E1165 Type 2 diabetes mellitus with hyperglycemia: Secondary | ICD-10-CM | POA: Diagnosis not present

## 2016-10-04 DIAGNOSIS — I110 Hypertensive heart disease with heart failure: Secondary | ICD-10-CM | POA: Diagnosis not present

## 2016-10-04 DIAGNOSIS — Z794 Long term (current) use of insulin: Secondary | ICD-10-CM | POA: Diagnosis not present

## 2016-10-04 DIAGNOSIS — J441 Chronic obstructive pulmonary disease with (acute) exacerbation: Secondary | ICD-10-CM | POA: Diagnosis not present

## 2016-10-04 DIAGNOSIS — I509 Heart failure, unspecified: Secondary | ICD-10-CM | POA: Diagnosis not present

## 2016-10-04 DIAGNOSIS — Z7982 Long term (current) use of aspirin: Secondary | ICD-10-CM | POA: Diagnosis not present

## 2016-10-04 DIAGNOSIS — E1165 Type 2 diabetes mellitus with hyperglycemia: Secondary | ICD-10-CM | POA: Diagnosis not present

## 2016-10-04 DIAGNOSIS — E785 Hyperlipidemia, unspecified: Secondary | ICD-10-CM | POA: Diagnosis not present

## 2016-10-05 ENCOUNTER — Other Ambulatory Visit: Payer: Self-pay | Admitting: *Deleted

## 2016-10-05 NOTE — Patient Outreach (Signed)
[facility]) Care Management  10/05/2016  Morgan Fischer [birth year] [id number]   RN received a call from pt and returned the call. RN spoke with pt and verified identifiers prior to inquired on pt's management of care. Pt verified she has been managing her COPD and remains in the GREEN zone on the action plan. Pt denies any issues with no symptoms or trouble breathing. Reports no changes in her medications and continues to attend all medical appointments with no problems. Review the plan of care and goals met and verified pt has a clear understanding of how to manage her COPD and will continue to stay alert with proactive interventions and early detection in reaching out to her provider with any precipitating symptoms. Based upon the progress this pt has made RN will discharged pt via community case management services related to nursing this day and alert other disciplinary that are involved with pharmacy via [facility]. Will also alert pt's primary provider of pt's graduating from the [facility] program and close on behalf of this disciplinary personnel. RN inquired on any other needs at this time for community resources and/or any needed services (pt denied) at this time. RN left contact name and number if future services are needed. Pt very grateful and the call was ended.   Raina Mina, RN Care Management Coordinator [place] Office [phone]

## 2016-10-05 NOTE — Patient Outreach (Signed)
[facility]) Care Management  10/05/2016  Markeeta Scalf 1950-01-19 [id number]   RN attempted outreach call today however unsuccessful but RN able to leave a generic voice message requesting a call back. Will update care plan at that time with the intentions of case closure if pt continue to manage her care well. Will rescheduled follow up call.   Elliot Cousin, RN Care Management Coordinator [facility] Main Office [phone]

## 2016-10-09 ENCOUNTER — Ambulatory Visit: Payer: Self-pay | Admitting: *Deleted

## 2016-10-11 DIAGNOSIS — I110 Hypertensive heart disease with heart failure: Secondary | ICD-10-CM | POA: Diagnosis not present

## 2016-10-11 DIAGNOSIS — I509 Heart failure, unspecified: Secondary | ICD-10-CM | POA: Diagnosis not present

## 2016-10-11 DIAGNOSIS — J441 Chronic obstructive pulmonary disease with (acute) exacerbation: Secondary | ICD-10-CM | POA: Diagnosis not present

## 2016-10-11 DIAGNOSIS — E1165 Type 2 diabetes mellitus with hyperglycemia: Secondary | ICD-10-CM | POA: Diagnosis not present

## 2016-10-11 DIAGNOSIS — Z7982 Long term (current) use of aspirin: Secondary | ICD-10-CM | POA: Diagnosis not present

## 2016-10-11 DIAGNOSIS — E785 Hyperlipidemia, unspecified: Secondary | ICD-10-CM | POA: Diagnosis not present

## 2016-10-11 DIAGNOSIS — Z794 Long term (current) use of insulin: Secondary | ICD-10-CM | POA: Diagnosis not present

## 2016-10-12 DIAGNOSIS — J45909 Unspecified asthma, uncomplicated: Secondary | ICD-10-CM | POA: Diagnosis not present

## 2016-10-12 DIAGNOSIS — J449 Chronic obstructive pulmonary disease, unspecified: Secondary | ICD-10-CM | POA: Diagnosis not present

## 2016-10-16 NOTE — Patient Outreach (Signed)
[facility]) Care Management  10/16/2016  Morgan Fischer 09-08-1950 [id number]   Erroneous error

## 2016-10-17 DIAGNOSIS — I509 Heart failure, unspecified: Secondary | ICD-10-CM | POA: Diagnosis not present

## 2016-10-17 DIAGNOSIS — I110 Hypertensive heart disease with heart failure: Secondary | ICD-10-CM | POA: Diagnosis not present

## 2016-10-17 DIAGNOSIS — E1165 Type 2 diabetes mellitus with hyperglycemia: Secondary | ICD-10-CM | POA: Diagnosis not present

## 2016-10-17 DIAGNOSIS — E785 Hyperlipidemia, unspecified: Secondary | ICD-10-CM | POA: Diagnosis not present

## 2016-10-17 DIAGNOSIS — Z7982 Long term (current) use of aspirin: Secondary | ICD-10-CM | POA: Diagnosis not present

## 2016-10-17 DIAGNOSIS — J441 Chronic obstructive pulmonary disease with (acute) exacerbation: Secondary | ICD-10-CM | POA: Diagnosis not present

## 2016-10-17 DIAGNOSIS — Z794 Long term (current) use of insulin: Secondary | ICD-10-CM | POA: Diagnosis not present

## 2016-10-18 DIAGNOSIS — M356 Relapsing panniculitis [Weber-Christian]: Secondary | ICD-10-CM | POA: Diagnosis not present

## 2016-10-18 DIAGNOSIS — J45909 Unspecified asthma, uncomplicated: Secondary | ICD-10-CM | POA: Diagnosis not present

## 2016-10-18 DIAGNOSIS — J449 Chronic obstructive pulmonary disease, unspecified: Secondary | ICD-10-CM | POA: Diagnosis not present

## 2016-10-18 DIAGNOSIS — R269 Unspecified abnormalities of gait and mobility: Secondary | ICD-10-CM | POA: Diagnosis not present

## 2016-10-22 DIAGNOSIS — J45909 Unspecified asthma, uncomplicated: Secondary | ICD-10-CM | POA: Diagnosis not present

## 2016-10-22 DIAGNOSIS — J449 Chronic obstructive pulmonary disease, unspecified: Secondary | ICD-10-CM | POA: Diagnosis not present

## 2016-10-25 DIAGNOSIS — M356 Relapsing panniculitis [Weber-Christian]: Secondary | ICD-10-CM | POA: Diagnosis not present

## 2016-10-25 DIAGNOSIS — J45909 Unspecified asthma, uncomplicated: Secondary | ICD-10-CM | POA: Diagnosis not present

## 2016-10-25 DIAGNOSIS — J441 Chronic obstructive pulmonary disease with (acute) exacerbation: Secondary | ICD-10-CM | POA: Diagnosis not present

## 2016-10-25 DIAGNOSIS — I509 Heart failure, unspecified: Secondary | ICD-10-CM | POA: Diagnosis not present

## 2016-10-25 DIAGNOSIS — E1165 Type 2 diabetes mellitus with hyperglycemia: Secondary | ICD-10-CM | POA: Diagnosis not present

## 2016-10-25 DIAGNOSIS — I110 Hypertensive heart disease with heart failure: Secondary | ICD-10-CM | POA: Diagnosis not present

## 2016-10-25 DIAGNOSIS — R269 Unspecified abnormalities of gait and mobility: Secondary | ICD-10-CM | POA: Diagnosis not present

## 2016-10-25 DIAGNOSIS — J449 Chronic obstructive pulmonary disease, unspecified: Secondary | ICD-10-CM | POA: Diagnosis not present

## 2016-10-25 DIAGNOSIS — Z794 Long term (current) use of insulin: Secondary | ICD-10-CM | POA: Diagnosis not present

## 2016-10-25 DIAGNOSIS — E785 Hyperlipidemia, unspecified: Secondary | ICD-10-CM | POA: Diagnosis not present

## 2016-10-25 DIAGNOSIS — Z7982 Long term (current) use of aspirin: Secondary | ICD-10-CM | POA: Diagnosis not present

## 2016-10-27 DIAGNOSIS — I110 Hypertensive heart disease with heart failure: Secondary | ICD-10-CM | POA: Diagnosis not present

## 2016-10-27 DIAGNOSIS — E785 Hyperlipidemia, unspecified: Secondary | ICD-10-CM | POA: Diagnosis not present

## 2016-10-27 DIAGNOSIS — Z7982 Long term (current) use of aspirin: Secondary | ICD-10-CM | POA: Diagnosis not present

## 2016-10-27 DIAGNOSIS — E1165 Type 2 diabetes mellitus with hyperglycemia: Secondary | ICD-10-CM | POA: Diagnosis not present

## 2016-10-27 DIAGNOSIS — I509 Heart failure, unspecified: Secondary | ICD-10-CM | POA: Diagnosis not present

## 2016-10-27 DIAGNOSIS — Z794 Long term (current) use of insulin: Secondary | ICD-10-CM | POA: Diagnosis not present

## 2016-10-27 DIAGNOSIS — J441 Chronic obstructive pulmonary disease with (acute) exacerbation: Secondary | ICD-10-CM | POA: Diagnosis not present

## 2016-11-01 DIAGNOSIS — Z794 Long term (current) use of insulin: Secondary | ICD-10-CM | POA: Diagnosis not present

## 2016-11-01 DIAGNOSIS — J441 Chronic obstructive pulmonary disease with (acute) exacerbation: Secondary | ICD-10-CM | POA: Diagnosis not present

## 2016-11-01 DIAGNOSIS — Z7982 Long term (current) use of aspirin: Secondary | ICD-10-CM | POA: Diagnosis not present

## 2016-11-01 DIAGNOSIS — E785 Hyperlipidemia, unspecified: Secondary | ICD-10-CM | POA: Diagnosis not present

## 2016-11-01 DIAGNOSIS — E1165 Type 2 diabetes mellitus with hyperglycemia: Secondary | ICD-10-CM | POA: Diagnosis not present

## 2016-11-01 DIAGNOSIS — I509 Heart failure, unspecified: Secondary | ICD-10-CM | POA: Diagnosis not present

## 2016-11-01 DIAGNOSIS — I110 Hypertensive heart disease with heart failure: Secondary | ICD-10-CM | POA: Diagnosis not present

## 2016-11-02 DIAGNOSIS — J441 Chronic obstructive pulmonary disease with (acute) exacerbation: Secondary | ICD-10-CM | POA: Diagnosis not present

## 2016-11-02 DIAGNOSIS — Z794 Long term (current) use of insulin: Secondary | ICD-10-CM | POA: Diagnosis not present

## 2016-11-02 DIAGNOSIS — I110 Hypertensive heart disease with heart failure: Secondary | ICD-10-CM | POA: Diagnosis not present

## 2016-11-02 DIAGNOSIS — E785 Hyperlipidemia, unspecified: Secondary | ICD-10-CM | POA: Diagnosis not present

## 2016-11-02 DIAGNOSIS — Z7982 Long term (current) use of aspirin: Secondary | ICD-10-CM | POA: Diagnosis not present

## 2016-11-02 DIAGNOSIS — I509 Heart failure, unspecified: Secondary | ICD-10-CM | POA: Diagnosis not present

## 2016-11-02 DIAGNOSIS — E1165 Type 2 diabetes mellitus with hyperglycemia: Secondary | ICD-10-CM | POA: Diagnosis not present

## 2016-11-07 DIAGNOSIS — I517 Cardiomegaly: Secondary | ICD-10-CM | POA: Diagnosis not present

## 2016-11-07 DIAGNOSIS — Z7982 Long term (current) use of aspirin: Secondary | ICD-10-CM | POA: Diagnosis not present

## 2016-11-07 DIAGNOSIS — R51 Headache: Secondary | ICD-10-CM | POA: Diagnosis not present

## 2016-11-07 DIAGNOSIS — I509 Heart failure, unspecified: Secondary | ICD-10-CM | POA: Diagnosis not present

## 2016-11-07 DIAGNOSIS — J449 Chronic obstructive pulmonary disease, unspecified: Secondary | ICD-10-CM | POA: Diagnosis not present

## 2016-11-07 DIAGNOSIS — R05 Cough: Secondary | ICD-10-CM | POA: Diagnosis not present

## 2016-11-07 DIAGNOSIS — Z794 Long term (current) use of insulin: Secondary | ICD-10-CM | POA: Diagnosis not present

## 2016-11-07 DIAGNOSIS — E1165 Type 2 diabetes mellitus with hyperglycemia: Secondary | ICD-10-CM | POA: Diagnosis not present

## 2016-11-07 DIAGNOSIS — R9431 Abnormal electrocardiogram [ECG] [EKG]: Secondary | ICD-10-CM | POA: Diagnosis not present

## 2016-11-07 DIAGNOSIS — Z8709 Personal history of other diseases of the respiratory system: Secondary | ICD-10-CM | POA: Diagnosis not present

## 2016-11-07 DIAGNOSIS — E785 Hyperlipidemia, unspecified: Secondary | ICD-10-CM | POA: Diagnosis not present

## 2016-11-07 DIAGNOSIS — J441 Chronic obstructive pulmonary disease with (acute) exacerbation: Secondary | ICD-10-CM | POA: Diagnosis not present

## 2016-11-07 DIAGNOSIS — I110 Hypertensive heart disease with heart failure: Secondary | ICD-10-CM | POA: Diagnosis not present

## 2016-11-07 DIAGNOSIS — R0602 Shortness of breath: Secondary | ICD-10-CM | POA: Diagnosis not present

## 2016-11-07 DIAGNOSIS — R251 Tremor, unspecified: Secondary | ICD-10-CM | POA: Diagnosis not present

## 2016-11-09 DIAGNOSIS — B351 Tinea unguium: Secondary | ICD-10-CM | POA: Diagnosis not present

## 2016-11-09 DIAGNOSIS — G4700 Insomnia, unspecified: Secondary | ICD-10-CM | POA: Diagnosis not present

## 2016-11-09 DIAGNOSIS — L84 Corns and callosities: Secondary | ICD-10-CM | POA: Diagnosis not present

## 2016-11-09 DIAGNOSIS — J45909 Unspecified asthma, uncomplicated: Secondary | ICD-10-CM | POA: Diagnosis not present

## 2016-11-09 DIAGNOSIS — I429 Cardiomyopathy, unspecified: Secondary | ICD-10-CM | POA: Diagnosis not present

## 2016-11-09 DIAGNOSIS — M79671 Pain in right foot: Secondary | ICD-10-CM | POA: Diagnosis not present

## 2016-11-09 DIAGNOSIS — E785 Hyperlipidemia, unspecified: Secondary | ICD-10-CM | POA: Diagnosis not present

## 2016-11-09 DIAGNOSIS — E1159 Type 2 diabetes mellitus with other circulatory complications: Secondary | ICD-10-CM | POA: Diagnosis not present

## 2016-11-09 DIAGNOSIS — Z1389 Encounter for screening for other disorder: Secondary | ICD-10-CM | POA: Diagnosis not present

## 2016-11-09 DIAGNOSIS — E119 Type 2 diabetes mellitus without complications: Secondary | ICD-10-CM | POA: Diagnosis not present

## 2016-11-09 DIAGNOSIS — Z23 Encounter for immunization: Secondary | ICD-10-CM | POA: Diagnosis not present

## 2016-11-09 DIAGNOSIS — G4733 Obstructive sleep apnea (adult) (pediatric): Secondary | ICD-10-CM | POA: Diagnosis not present

## 2016-11-09 DIAGNOSIS — I10 Essential (primary) hypertension: Secondary | ICD-10-CM | POA: Diagnosis not present

## 2016-11-09 DIAGNOSIS — I119 Hypertensive heart disease without heart failure: Secondary | ICD-10-CM | POA: Diagnosis not present

## 2016-11-09 DIAGNOSIS — M79672 Pain in left foot: Secondary | ICD-10-CM | POA: Diagnosis not present

## 2016-11-12 DIAGNOSIS — J449 Chronic obstructive pulmonary disease, unspecified: Secondary | ICD-10-CM | POA: Diagnosis not present

## 2016-11-12 DIAGNOSIS — J45909 Unspecified asthma, uncomplicated: Secondary | ICD-10-CM | POA: Diagnosis not present

## 2016-11-16 DIAGNOSIS — Z7982 Long term (current) use of aspirin: Secondary | ICD-10-CM | POA: Diagnosis not present

## 2016-11-16 DIAGNOSIS — E785 Hyperlipidemia, unspecified: Secondary | ICD-10-CM | POA: Diagnosis not present

## 2016-11-16 DIAGNOSIS — I509 Heart failure, unspecified: Secondary | ICD-10-CM | POA: Diagnosis not present

## 2016-11-16 DIAGNOSIS — E1165 Type 2 diabetes mellitus with hyperglycemia: Secondary | ICD-10-CM | POA: Diagnosis not present

## 2016-11-16 DIAGNOSIS — I110 Hypertensive heart disease with heart failure: Secondary | ICD-10-CM | POA: Diagnosis not present

## 2016-11-16 DIAGNOSIS — J441 Chronic obstructive pulmonary disease with (acute) exacerbation: Secondary | ICD-10-CM | POA: Diagnosis not present

## 2016-11-16 DIAGNOSIS — Z794 Long term (current) use of insulin: Secondary | ICD-10-CM | POA: Diagnosis not present

## 2016-11-18 DIAGNOSIS — J449 Chronic obstructive pulmonary disease, unspecified: Secondary | ICD-10-CM | POA: Diagnosis not present

## 2016-11-18 DIAGNOSIS — M356 Relapsing panniculitis [Weber-Christian]: Secondary | ICD-10-CM | POA: Diagnosis not present

## 2016-11-18 DIAGNOSIS — J45909 Unspecified asthma, uncomplicated: Secondary | ICD-10-CM | POA: Diagnosis not present

## 2016-11-18 DIAGNOSIS — R269 Unspecified abnormalities of gait and mobility: Secondary | ICD-10-CM | POA: Diagnosis not present

## 2016-11-19 DIAGNOSIS — J449 Chronic obstructive pulmonary disease, unspecified: Secondary | ICD-10-CM | POA: Diagnosis not present

## 2016-11-19 DIAGNOSIS — J9601 Acute respiratory failure with hypoxia: Secondary | ICD-10-CM | POA: Diagnosis not present

## 2016-11-19 DIAGNOSIS — I5033 Acute on chronic diastolic (congestive) heart failure: Secondary | ICD-10-CM | POA: Diagnosis not present

## 2016-11-19 DIAGNOSIS — R0602 Shortness of breath: Secondary | ICD-10-CM | POA: Diagnosis not present

## 2016-11-19 DIAGNOSIS — J9600 Acute respiratory failure, unspecified whether with hypoxia or hypercapnia: Secondary | ICD-10-CM | POA: Diagnosis not present

## 2016-11-19 DIAGNOSIS — I517 Cardiomegaly: Secondary | ICD-10-CM | POA: Diagnosis not present

## 2016-11-19 DIAGNOSIS — N183 Chronic kidney disease, stage 3 unspecified: Secondary | ICD-10-CM

## 2016-11-19 DIAGNOSIS — R069 Unspecified abnormalities of breathing: Secondary | ICD-10-CM | POA: Diagnosis not present

## 2016-11-19 DIAGNOSIS — I2699 Other pulmonary embolism without acute cor pulmonale: Secondary | ICD-10-CM | POA: Diagnosis not present

## 2016-11-19 DIAGNOSIS — G4733 Obstructive sleep apnea (adult) (pediatric): Secondary | ICD-10-CM | POA: Diagnosis not present

## 2016-11-19 DIAGNOSIS — J9602 Acute respiratory failure with hypercapnia: Secondary | ICD-10-CM | POA: Diagnosis not present

## 2016-11-19 DIAGNOSIS — R918 Other nonspecific abnormal finding of lung field: Secondary | ICD-10-CM | POA: Diagnosis not present

## 2016-11-19 DIAGNOSIS — I10 Essential (primary) hypertension: Secondary | ICD-10-CM | POA: Insufficient documentation

## 2016-11-19 DIAGNOSIS — J9622 Acute and chronic respiratory failure with hypercapnia: Secondary | ICD-10-CM | POA: Diagnosis not present

## 2016-11-19 DIAGNOSIS — I110 Hypertensive heart disease with heart failure: Secondary | ICD-10-CM | POA: Diagnosis not present

## 2016-11-19 DIAGNOSIS — J441 Chronic obstructive pulmonary disease with (acute) exacerbation: Secondary | ICD-10-CM | POA: Diagnosis not present

## 2016-11-19 DIAGNOSIS — M109 Gout, unspecified: Secondary | ICD-10-CM | POA: Diagnosis not present

## 2016-11-19 DIAGNOSIS — E1122 Type 2 diabetes mellitus with diabetic chronic kidney disease: Secondary | ICD-10-CM

## 2016-11-19 DIAGNOSIS — J9621 Acute and chronic respiratory failure with hypoxia: Secondary | ICD-10-CM | POA: Diagnosis not present

## 2016-11-19 DIAGNOSIS — Z7951 Long term (current) use of inhaled steroids: Secondary | ICD-10-CM | POA: Diagnosis not present

## 2016-11-19 DIAGNOSIS — E118 Type 2 diabetes mellitus with unspecified complications: Secondary | ICD-10-CM | POA: Diagnosis not present

## 2016-11-19 DIAGNOSIS — R000 Tachycardia, unspecified: Secondary | ICD-10-CM | POA: Diagnosis not present

## 2016-11-19 DIAGNOSIS — E119 Type 2 diabetes mellitus without complications: Secondary | ICD-10-CM

## 2016-11-19 HISTORY — DX: Type 2 diabetes mellitus with diabetic chronic kidney disease: E11.22

## 2016-11-19 HISTORY — DX: Gout, unspecified: M10.9

## 2016-11-19 HISTORY — DX: Obstructive sleep apnea (adult) (pediatric): G47.33

## 2016-11-19 HISTORY — DX: Essential (primary) hypertension: I10

## 2016-11-19 HISTORY — DX: Type 2 diabetes mellitus without complications: E11.9

## 2016-11-22 DIAGNOSIS — E1165 Type 2 diabetes mellitus with hyperglycemia: Secondary | ICD-10-CM | POA: Diagnosis not present

## 2016-11-22 DIAGNOSIS — Z794 Long term (current) use of insulin: Secondary | ICD-10-CM | POA: Diagnosis not present

## 2016-11-22 DIAGNOSIS — I110 Hypertensive heart disease with heart failure: Secondary | ICD-10-CM | POA: Diagnosis not present

## 2016-11-22 DIAGNOSIS — J449 Chronic obstructive pulmonary disease, unspecified: Secondary | ICD-10-CM | POA: Diagnosis not present

## 2016-11-22 DIAGNOSIS — Z7982 Long term (current) use of aspirin: Secondary | ICD-10-CM | POA: Diagnosis not present

## 2016-11-22 DIAGNOSIS — J441 Chronic obstructive pulmonary disease with (acute) exacerbation: Secondary | ICD-10-CM | POA: Diagnosis not present

## 2016-11-22 DIAGNOSIS — J45909 Unspecified asthma, uncomplicated: Secondary | ICD-10-CM | POA: Diagnosis not present

## 2016-11-22 DIAGNOSIS — I509 Heart failure, unspecified: Secondary | ICD-10-CM | POA: Diagnosis not present

## 2016-11-22 DIAGNOSIS — E785 Hyperlipidemia, unspecified: Secondary | ICD-10-CM | POA: Diagnosis not present

## 2016-11-24 DIAGNOSIS — E785 Hyperlipidemia, unspecified: Secondary | ICD-10-CM | POA: Diagnosis not present

## 2016-11-24 DIAGNOSIS — I110 Hypertensive heart disease with heart failure: Secondary | ICD-10-CM | POA: Diagnosis not present

## 2016-11-24 DIAGNOSIS — Z794 Long term (current) use of insulin: Secondary | ICD-10-CM | POA: Diagnosis not present

## 2016-11-24 DIAGNOSIS — G4733 Obstructive sleep apnea (adult) (pediatric): Secondary | ICD-10-CM | POA: Diagnosis not present

## 2016-11-24 DIAGNOSIS — J441 Chronic obstructive pulmonary disease with (acute) exacerbation: Secondary | ICD-10-CM | POA: Diagnosis not present

## 2016-11-24 DIAGNOSIS — M109 Gout, unspecified: Secondary | ICD-10-CM | POA: Diagnosis not present

## 2016-11-24 DIAGNOSIS — Z7982 Long term (current) use of aspirin: Secondary | ICD-10-CM | POA: Diagnosis not present

## 2016-11-24 DIAGNOSIS — E1165 Type 2 diabetes mellitus with hyperglycemia: Secondary | ICD-10-CM | POA: Diagnosis not present

## 2016-11-24 DIAGNOSIS — I5033 Acute on chronic diastolic (congestive) heart failure: Secondary | ICD-10-CM | POA: Diagnosis not present

## 2016-11-27 DIAGNOSIS — E785 Hyperlipidemia, unspecified: Secondary | ICD-10-CM | POA: Diagnosis not present

## 2016-11-27 DIAGNOSIS — Z7982 Long term (current) use of aspirin: Secondary | ICD-10-CM | POA: Diagnosis not present

## 2016-11-27 DIAGNOSIS — M109 Gout, unspecified: Secondary | ICD-10-CM | POA: Diagnosis not present

## 2016-11-27 DIAGNOSIS — Z794 Long term (current) use of insulin: Secondary | ICD-10-CM | POA: Diagnosis not present

## 2016-11-27 DIAGNOSIS — J441 Chronic obstructive pulmonary disease with (acute) exacerbation: Secondary | ICD-10-CM | POA: Diagnosis not present

## 2016-11-27 DIAGNOSIS — I110 Hypertensive heart disease with heart failure: Secondary | ICD-10-CM | POA: Diagnosis not present

## 2016-11-27 DIAGNOSIS — E1165 Type 2 diabetes mellitus with hyperglycemia: Secondary | ICD-10-CM | POA: Diagnosis not present

## 2016-11-27 DIAGNOSIS — G4733 Obstructive sleep apnea (adult) (pediatric): Secondary | ICD-10-CM | POA: Diagnosis not present

## 2016-11-27 DIAGNOSIS — I5033 Acute on chronic diastolic (congestive) heart failure: Secondary | ICD-10-CM | POA: Diagnosis not present

## 2016-11-30 DIAGNOSIS — G4733 Obstructive sleep apnea (adult) (pediatric): Secondary | ICD-10-CM | POA: Diagnosis not present

## 2016-11-30 DIAGNOSIS — I5033 Acute on chronic diastolic (congestive) heart failure: Secondary | ICD-10-CM | POA: Diagnosis not present

## 2016-11-30 DIAGNOSIS — E785 Hyperlipidemia, unspecified: Secondary | ICD-10-CM | POA: Diagnosis not present

## 2016-11-30 DIAGNOSIS — M109 Gout, unspecified: Secondary | ICD-10-CM | POA: Diagnosis not present

## 2016-11-30 DIAGNOSIS — E1165 Type 2 diabetes mellitus with hyperglycemia: Secondary | ICD-10-CM | POA: Diagnosis not present

## 2016-11-30 DIAGNOSIS — Z794 Long term (current) use of insulin: Secondary | ICD-10-CM | POA: Diagnosis not present

## 2016-11-30 DIAGNOSIS — J441 Chronic obstructive pulmonary disease with (acute) exacerbation: Secondary | ICD-10-CM | POA: Diagnosis not present

## 2016-11-30 DIAGNOSIS — I110 Hypertensive heart disease with heart failure: Secondary | ICD-10-CM | POA: Diagnosis not present

## 2016-11-30 DIAGNOSIS — Z7982 Long term (current) use of aspirin: Secondary | ICD-10-CM | POA: Diagnosis not present

## 2016-12-05 DIAGNOSIS — M109 Gout, unspecified: Secondary | ICD-10-CM | POA: Diagnosis not present

## 2016-12-05 DIAGNOSIS — E1165 Type 2 diabetes mellitus with hyperglycemia: Secondary | ICD-10-CM | POA: Diagnosis not present

## 2016-12-05 DIAGNOSIS — I5033 Acute on chronic diastolic (congestive) heart failure: Secondary | ICD-10-CM | POA: Diagnosis not present

## 2016-12-05 DIAGNOSIS — J441 Chronic obstructive pulmonary disease with (acute) exacerbation: Secondary | ICD-10-CM | POA: Diagnosis not present

## 2016-12-05 DIAGNOSIS — G4733 Obstructive sleep apnea (adult) (pediatric): Secondary | ICD-10-CM | POA: Diagnosis not present

## 2016-12-05 DIAGNOSIS — Z7982 Long term (current) use of aspirin: Secondary | ICD-10-CM | POA: Diagnosis not present

## 2016-12-05 DIAGNOSIS — E785 Hyperlipidemia, unspecified: Secondary | ICD-10-CM | POA: Diagnosis not present

## 2016-12-05 DIAGNOSIS — I110 Hypertensive heart disease with heart failure: Secondary | ICD-10-CM | POA: Diagnosis not present

## 2016-12-05 DIAGNOSIS — Z794 Long term (current) use of insulin: Secondary | ICD-10-CM | POA: Diagnosis not present

## 2016-12-06 DIAGNOSIS — R269 Unspecified abnormalities of gait and mobility: Secondary | ICD-10-CM | POA: Diagnosis not present

## 2016-12-06 DIAGNOSIS — J45909 Unspecified asthma, uncomplicated: Secondary | ICD-10-CM | POA: Diagnosis not present

## 2016-12-06 DIAGNOSIS — M356 Relapsing panniculitis [Weber-Christian]: Secondary | ICD-10-CM | POA: Diagnosis not present

## 2016-12-06 DIAGNOSIS — J449 Chronic obstructive pulmonary disease, unspecified: Secondary | ICD-10-CM | POA: Diagnosis not present

## 2016-12-08 DIAGNOSIS — E1165 Type 2 diabetes mellitus with hyperglycemia: Secondary | ICD-10-CM | POA: Diagnosis not present

## 2016-12-08 DIAGNOSIS — Z794 Long term (current) use of insulin: Secondary | ICD-10-CM | POA: Diagnosis not present

## 2016-12-08 DIAGNOSIS — I5033 Acute on chronic diastolic (congestive) heart failure: Secondary | ICD-10-CM | POA: Diagnosis not present

## 2016-12-08 DIAGNOSIS — Z7982 Long term (current) use of aspirin: Secondary | ICD-10-CM | POA: Diagnosis not present

## 2016-12-08 DIAGNOSIS — I110 Hypertensive heart disease with heart failure: Secondary | ICD-10-CM | POA: Diagnosis not present

## 2016-12-08 DIAGNOSIS — M109 Gout, unspecified: Secondary | ICD-10-CM | POA: Diagnosis not present

## 2016-12-08 DIAGNOSIS — E785 Hyperlipidemia, unspecified: Secondary | ICD-10-CM | POA: Diagnosis not present

## 2016-12-08 DIAGNOSIS — G4733 Obstructive sleep apnea (adult) (pediatric): Secondary | ICD-10-CM | POA: Diagnosis not present

## 2016-12-08 DIAGNOSIS — J441 Chronic obstructive pulmonary disease with (acute) exacerbation: Secondary | ICD-10-CM | POA: Diagnosis not present

## 2016-12-12 DIAGNOSIS — E785 Hyperlipidemia, unspecified: Secondary | ICD-10-CM | POA: Diagnosis not present

## 2016-12-12 DIAGNOSIS — Z7982 Long term (current) use of aspirin: Secondary | ICD-10-CM | POA: Diagnosis not present

## 2016-12-12 DIAGNOSIS — J449 Chronic obstructive pulmonary disease, unspecified: Secondary | ICD-10-CM | POA: Diagnosis not present

## 2016-12-12 DIAGNOSIS — I110 Hypertensive heart disease with heart failure: Secondary | ICD-10-CM | POA: Diagnosis not present

## 2016-12-12 DIAGNOSIS — G4733 Obstructive sleep apnea (adult) (pediatric): Secondary | ICD-10-CM | POA: Diagnosis not present

## 2016-12-12 DIAGNOSIS — Z794 Long term (current) use of insulin: Secondary | ICD-10-CM | POA: Diagnosis not present

## 2016-12-12 DIAGNOSIS — J45909 Unspecified asthma, uncomplicated: Secondary | ICD-10-CM | POA: Diagnosis not present

## 2016-12-12 DIAGNOSIS — E1165 Type 2 diabetes mellitus with hyperglycemia: Secondary | ICD-10-CM | POA: Diagnosis not present

## 2016-12-12 DIAGNOSIS — J441 Chronic obstructive pulmonary disease with (acute) exacerbation: Secondary | ICD-10-CM | POA: Diagnosis not present

## 2016-12-12 DIAGNOSIS — I5033 Acute on chronic diastolic (congestive) heart failure: Secondary | ICD-10-CM | POA: Diagnosis not present

## 2016-12-12 DIAGNOSIS — M109 Gout, unspecified: Secondary | ICD-10-CM | POA: Diagnosis not present

## 2016-12-13 DIAGNOSIS — E1165 Type 2 diabetes mellitus with hyperglycemia: Secondary | ICD-10-CM | POA: Diagnosis not present

## 2016-12-13 DIAGNOSIS — M109 Gout, unspecified: Secondary | ICD-10-CM | POA: Diagnosis not present

## 2016-12-13 DIAGNOSIS — G4733 Obstructive sleep apnea (adult) (pediatric): Secondary | ICD-10-CM | POA: Diagnosis not present

## 2016-12-13 DIAGNOSIS — I110 Hypertensive heart disease with heart failure: Secondary | ICD-10-CM | POA: Diagnosis not present

## 2016-12-13 DIAGNOSIS — Z7982 Long term (current) use of aspirin: Secondary | ICD-10-CM | POA: Diagnosis not present

## 2016-12-13 DIAGNOSIS — E785 Hyperlipidemia, unspecified: Secondary | ICD-10-CM | POA: Diagnosis not present

## 2016-12-13 DIAGNOSIS — Z794 Long term (current) use of insulin: Secondary | ICD-10-CM | POA: Diagnosis not present

## 2016-12-13 DIAGNOSIS — J441 Chronic obstructive pulmonary disease with (acute) exacerbation: Secondary | ICD-10-CM | POA: Diagnosis not present

## 2016-12-13 DIAGNOSIS — I5033 Acute on chronic diastolic (congestive) heart failure: Secondary | ICD-10-CM | POA: Diagnosis not present

## 2016-12-14 DIAGNOSIS — I5033 Acute on chronic diastolic (congestive) heart failure: Secondary | ICD-10-CM | POA: Diagnosis not present

## 2016-12-14 DIAGNOSIS — Z7982 Long term (current) use of aspirin: Secondary | ICD-10-CM | POA: Diagnosis not present

## 2016-12-14 DIAGNOSIS — I110 Hypertensive heart disease with heart failure: Secondary | ICD-10-CM | POA: Diagnosis not present

## 2016-12-14 DIAGNOSIS — G4733 Obstructive sleep apnea (adult) (pediatric): Secondary | ICD-10-CM | POA: Diagnosis not present

## 2016-12-14 DIAGNOSIS — E1165 Type 2 diabetes mellitus with hyperglycemia: Secondary | ICD-10-CM | POA: Diagnosis not present

## 2016-12-14 DIAGNOSIS — J441 Chronic obstructive pulmonary disease with (acute) exacerbation: Secondary | ICD-10-CM | POA: Diagnosis not present

## 2016-12-14 DIAGNOSIS — E785 Hyperlipidemia, unspecified: Secondary | ICD-10-CM | POA: Diagnosis not present

## 2016-12-14 DIAGNOSIS — Z794 Long term (current) use of insulin: Secondary | ICD-10-CM | POA: Diagnosis not present

## 2016-12-14 DIAGNOSIS — M109 Gout, unspecified: Secondary | ICD-10-CM | POA: Diagnosis not present

## 2016-12-18 DIAGNOSIS — R269 Unspecified abnormalities of gait and mobility: Secondary | ICD-10-CM | POA: Diagnosis not present

## 2016-12-18 DIAGNOSIS — I5033 Acute on chronic diastolic (congestive) heart failure: Secondary | ICD-10-CM | POA: Diagnosis not present

## 2016-12-18 DIAGNOSIS — J45909 Unspecified asthma, uncomplicated: Secondary | ICD-10-CM | POA: Diagnosis not present

## 2016-12-18 DIAGNOSIS — J441 Chronic obstructive pulmonary disease with (acute) exacerbation: Secondary | ICD-10-CM | POA: Diagnosis not present

## 2016-12-18 DIAGNOSIS — M109 Gout, unspecified: Secondary | ICD-10-CM | POA: Diagnosis not present

## 2016-12-18 DIAGNOSIS — E1165 Type 2 diabetes mellitus with hyperglycemia: Secondary | ICD-10-CM | POA: Diagnosis not present

## 2016-12-18 DIAGNOSIS — J449 Chronic obstructive pulmonary disease, unspecified: Secondary | ICD-10-CM | POA: Diagnosis not present

## 2016-12-18 DIAGNOSIS — Z7982 Long term (current) use of aspirin: Secondary | ICD-10-CM | POA: Diagnosis not present

## 2016-12-18 DIAGNOSIS — Z794 Long term (current) use of insulin: Secondary | ICD-10-CM | POA: Diagnosis not present

## 2016-12-18 DIAGNOSIS — G4733 Obstructive sleep apnea (adult) (pediatric): Secondary | ICD-10-CM | POA: Diagnosis not present

## 2016-12-18 DIAGNOSIS — E785 Hyperlipidemia, unspecified: Secondary | ICD-10-CM | POA: Diagnosis not present

## 2016-12-18 DIAGNOSIS — M356 Relapsing panniculitis [Weber-Christian]: Secondary | ICD-10-CM | POA: Diagnosis not present

## 2016-12-18 DIAGNOSIS — I110 Hypertensive heart disease with heart failure: Secondary | ICD-10-CM | POA: Diagnosis not present

## 2016-12-19 DIAGNOSIS — I110 Hypertensive heart disease with heart failure: Secondary | ICD-10-CM | POA: Diagnosis not present

## 2016-12-19 DIAGNOSIS — E785 Hyperlipidemia, unspecified: Secondary | ICD-10-CM | POA: Diagnosis not present

## 2016-12-19 DIAGNOSIS — Z794 Long term (current) use of insulin: Secondary | ICD-10-CM | POA: Diagnosis not present

## 2016-12-19 DIAGNOSIS — E1165 Type 2 diabetes mellitus with hyperglycemia: Secondary | ICD-10-CM | POA: Diagnosis not present

## 2016-12-19 DIAGNOSIS — I5033 Acute on chronic diastolic (congestive) heart failure: Secondary | ICD-10-CM | POA: Diagnosis not present

## 2016-12-19 DIAGNOSIS — J441 Chronic obstructive pulmonary disease with (acute) exacerbation: Secondary | ICD-10-CM | POA: Diagnosis not present

## 2016-12-19 DIAGNOSIS — M109 Gout, unspecified: Secondary | ICD-10-CM | POA: Diagnosis not present

## 2016-12-19 DIAGNOSIS — Z7982 Long term (current) use of aspirin: Secondary | ICD-10-CM | POA: Diagnosis not present

## 2016-12-19 DIAGNOSIS — G4733 Obstructive sleep apnea (adult) (pediatric): Secondary | ICD-10-CM | POA: Diagnosis not present

## 2016-12-20 DIAGNOSIS — J441 Chronic obstructive pulmonary disease with (acute) exacerbation: Secondary | ICD-10-CM | POA: Diagnosis not present

## 2016-12-20 DIAGNOSIS — G4733 Obstructive sleep apnea (adult) (pediatric): Secondary | ICD-10-CM | POA: Diagnosis not present

## 2016-12-20 DIAGNOSIS — Z7982 Long term (current) use of aspirin: Secondary | ICD-10-CM | POA: Diagnosis not present

## 2016-12-20 DIAGNOSIS — I110 Hypertensive heart disease with heart failure: Secondary | ICD-10-CM | POA: Diagnosis not present

## 2016-12-20 DIAGNOSIS — Z794 Long term (current) use of insulin: Secondary | ICD-10-CM | POA: Diagnosis not present

## 2016-12-20 DIAGNOSIS — I5033 Acute on chronic diastolic (congestive) heart failure: Secondary | ICD-10-CM | POA: Diagnosis not present

## 2016-12-20 DIAGNOSIS — E785 Hyperlipidemia, unspecified: Secondary | ICD-10-CM | POA: Diagnosis not present

## 2016-12-20 DIAGNOSIS — E1165 Type 2 diabetes mellitus with hyperglycemia: Secondary | ICD-10-CM | POA: Diagnosis not present

## 2016-12-20 DIAGNOSIS — M109 Gout, unspecified: Secondary | ICD-10-CM | POA: Diagnosis not present

## 2016-12-21 DIAGNOSIS — R410 Disorientation, unspecified: Secondary | ICD-10-CM | POA: Diagnosis not present

## 2016-12-21 DIAGNOSIS — I517 Cardiomegaly: Secondary | ICD-10-CM | POA: Diagnosis not present

## 2016-12-21 DIAGNOSIS — E785 Hyperlipidemia, unspecified: Secondary | ICD-10-CM | POA: Diagnosis not present

## 2016-12-21 DIAGNOSIS — J9602 Acute respiratory failure with hypercapnia: Secondary | ICD-10-CM | POA: Diagnosis not present

## 2016-12-21 DIAGNOSIS — I110 Hypertensive heart disease with heart failure: Secondary | ICD-10-CM | POA: Diagnosis not present

## 2016-12-21 DIAGNOSIS — Z7982 Long term (current) use of aspirin: Secondary | ICD-10-CM | POA: Diagnosis not present

## 2016-12-21 DIAGNOSIS — Z9989 Dependence on other enabling machines and devices: Secondary | ICD-10-CM | POA: Diagnosis not present

## 2016-12-21 DIAGNOSIS — R05 Cough: Secondary | ICD-10-CM | POA: Diagnosis not present

## 2016-12-21 DIAGNOSIS — I429 Cardiomyopathy, unspecified: Secondary | ICD-10-CM | POA: Diagnosis not present

## 2016-12-21 DIAGNOSIS — J9601 Acute respiratory failure with hypoxia: Secondary | ICD-10-CM | POA: Diagnosis not present

## 2016-12-21 DIAGNOSIS — E119 Type 2 diabetes mellitus without complications: Secondary | ICD-10-CM | POA: Diagnosis not present

## 2016-12-21 DIAGNOSIS — J45909 Unspecified asthma, uncomplicated: Secondary | ICD-10-CM | POA: Diagnosis not present

## 2016-12-21 DIAGNOSIS — R748 Abnormal levels of other serum enzymes: Secondary | ICD-10-CM | POA: Diagnosis not present

## 2016-12-21 DIAGNOSIS — I5033 Acute on chronic diastolic (congestive) heart failure: Secondary | ICD-10-CM | POA: Diagnosis not present

## 2016-12-21 DIAGNOSIS — I509 Heart failure, unspecified: Secondary | ICD-10-CM | POA: Diagnosis not present

## 2016-12-21 DIAGNOSIS — I10 Essential (primary) hypertension: Secondary | ICD-10-CM | POA: Diagnosis not present

## 2016-12-21 DIAGNOSIS — Z90710 Acquired absence of both cervix and uterus: Secondary | ICD-10-CM | POA: Diagnosis not present

## 2016-12-21 DIAGNOSIS — G4700 Insomnia, unspecified: Secondary | ICD-10-CM | POA: Diagnosis not present

## 2016-12-21 DIAGNOSIS — Z794 Long term (current) use of insulin: Secondary | ICD-10-CM | POA: Diagnosis not present

## 2016-12-21 DIAGNOSIS — R062 Wheezing: Secondary | ICD-10-CM | POA: Diagnosis not present

## 2016-12-21 DIAGNOSIS — R0602 Shortness of breath: Secondary | ICD-10-CM | POA: Diagnosis not present

## 2016-12-21 DIAGNOSIS — G4733 Obstructive sleep apnea (adult) (pediatric): Secondary | ICD-10-CM | POA: Diagnosis not present

## 2016-12-21 DIAGNOSIS — I119 Hypertensive heart disease without heart failure: Secondary | ICD-10-CM | POA: Diagnosis not present

## 2016-12-21 DIAGNOSIS — E662 Morbid (severe) obesity with alveolar hypoventilation: Secondary | ICD-10-CM | POA: Diagnosis not present

## 2016-12-21 DIAGNOSIS — E1165 Type 2 diabetes mellitus with hyperglycemia: Secondary | ICD-10-CM | POA: Diagnosis not present

## 2016-12-21 DIAGNOSIS — Z1389 Encounter for screening for other disorder: Secondary | ICD-10-CM | POA: Diagnosis not present

## 2016-12-21 DIAGNOSIS — J449 Chronic obstructive pulmonary disease, unspecified: Secondary | ICD-10-CM | POA: Diagnosis not present

## 2016-12-21 DIAGNOSIS — J441 Chronic obstructive pulmonary disease with (acute) exacerbation: Secondary | ICD-10-CM | POA: Diagnosis not present

## 2016-12-21 DIAGNOSIS — J9600 Acute respiratory failure, unspecified whether with hypoxia or hypercapnia: Secondary | ICD-10-CM | POA: Diagnosis not present

## 2016-12-22 DIAGNOSIS — I509 Heart failure, unspecified: Secondary | ICD-10-CM | POA: Insufficient documentation

## 2016-12-28 ENCOUNTER — Other Ambulatory Visit: Payer: Self-pay | Admitting: *Deleted

## 2016-12-28 ENCOUNTER — Encounter: Payer: Self-pay | Admitting: *Deleted

## 2016-12-28 DIAGNOSIS — G4733 Obstructive sleep apnea (adult) (pediatric): Secondary | ICD-10-CM | POA: Diagnosis not present

## 2016-12-28 DIAGNOSIS — I110 Hypertensive heart disease with heart failure: Secondary | ICD-10-CM | POA: Diagnosis not present

## 2016-12-28 DIAGNOSIS — I5033 Acute on chronic diastolic (congestive) heart failure: Secondary | ICD-10-CM | POA: Diagnosis not present

## 2016-12-28 DIAGNOSIS — Z7982 Long term (current) use of aspirin: Secondary | ICD-10-CM | POA: Diagnosis not present

## 2016-12-28 DIAGNOSIS — Z794 Long term (current) use of insulin: Secondary | ICD-10-CM | POA: Diagnosis not present

## 2016-12-28 DIAGNOSIS — E1165 Type 2 diabetes mellitus with hyperglycemia: Secondary | ICD-10-CM | POA: Diagnosis not present

## 2016-12-28 DIAGNOSIS — M109 Gout, unspecified: Secondary | ICD-10-CM | POA: Diagnosis not present

## 2016-12-28 DIAGNOSIS — E785 Hyperlipidemia, unspecified: Secondary | ICD-10-CM | POA: Diagnosis not present

## 2016-12-28 DIAGNOSIS — J441 Chronic obstructive pulmonary disease with (acute) exacerbation: Secondary | ICD-10-CM | POA: Diagnosis not present

## 2016-12-29 ENCOUNTER — Encounter: Payer: Self-pay | Admitting: *Deleted

## 2016-12-29 NOTE — Patient Outreach (Addendum)
[facility]) Care Management  12/29/2016  Morgan BeachJulia Fischer 04/30/1950 [id number]  Transition of Care Referral  Referral Date: 12/28/16 Referral Source: [employer] Date of Discharge: 12/15/16 [facility] Discharge Diagnosis: Respiratory Complications [facility]: [facility]  Outreach attempt # 1 to patient. HIPAA verified with patient. Patient confirmed being in the hospital for Respiratory Failure. Patient stated, she went to urgent care due to her Oxygen level decreasing in the 70's. The staff at the Urgent Care contact patient's Pulmonologist and patient was instructed to go to [facility]. Patient stated, she was discharged from the hospital with [facility] (RN). Patient reported weighing daily and her last recorded weight was 310.2 pounds. She stated, her weight has been stable since discharging from the hospital. She verbalized being prescribed an extra dose, as needed, of Lasix for any weight gain/extra fluid. Patient stated, her fluid restriction was decreased during her hospital stay to 1.5 mls daily. Patient uses Oxygen continuously at 2 liters and a Bipap at night. Patient explained her Oxygen reading was 87% on 12/27/16 and today it was in the 90's. She is continuing to monitor her Oxygen level throughout the day. Patient knows to contact her Cardiologist or Pulmonologist with any concerns. Patient stated, [facility] [place] [facility] with Dr. Cathlean CowerSteven Rohrbeck. Patient stated, she monitors her BG at home daily. Her Trulicity was stopped due to having low BG readings during her hospitalization. Patient reported, her husband passed away recently and she was prescribed Vistaril. She uses to medication to help sleep during the night. She stated, "this medication doesn't help much". She believes her restlessness may be related to her husband's demise. Patient reported, she has a hearing aid to her right ear and  she wears an amplifier to her left ear. Patient has a primary MD appointment on 01/12/17. She has a scheduled appointment with Dr. Bethanie DickerWayne Beauford on 12/31/16. [facility] services and benefits explained to patient. Patient agreed to services.    Plan: RN CM will contact patient within one week.  RM CM will send successful outreach letter to patient with introductory [facility] welcome package. RN CM will notify [facility] Case [facility] Assistant of case status. RN CM notified [facility] Case Management Assistant: patient agreed to services and case opened. RN CM will send patient EMMI educational materials.    Wynelle ClevelandJuanita Antonios Ostrow, RN, BSN, MHA/MSL, [facility] Telephonic Care Manager Coordinator [facility] Direct Phone: [phone] Toll Free: [phone] Fax: [phone]

## 2017-01-01 DIAGNOSIS — R0602 Shortness of breath: Secondary | ICD-10-CM | POA: Diagnosis not present

## 2017-01-01 DIAGNOSIS — G4730 Sleep apnea, unspecified: Secondary | ICD-10-CM | POA: Diagnosis not present

## 2017-01-01 DIAGNOSIS — J45909 Unspecified asthma, uncomplicated: Secondary | ICD-10-CM | POA: Diagnosis not present

## 2017-01-01 DIAGNOSIS — I2781 Cor pulmonale (chronic): Secondary | ICD-10-CM | POA: Diagnosis not present

## 2017-01-03 ENCOUNTER — Encounter: Payer: Self-pay | Admitting: *Deleted

## 2017-01-03 ENCOUNTER — Other Ambulatory Visit: Payer: Self-pay | Admitting: *Deleted

## 2017-01-03 NOTE — Patient Outreach (Signed)
[facility]) Care Management  [facility] Care Manager  01/03/2017   Morgan BeachJulia Fischer 12/04/1950 [id number]  S/O: Patient lives with her daughter. Patient's husband passed away within the last year. Patient verbalized having difficulty with anxiety and sleeping during the night. She was prescribed Vistaril, which hasn't been effective per patient. Patient was recently in the hospital from 12/21/16 - 12/25/16 diagnosed with COPD/CHF, per patient. Patient reported her last recorded weight was 310.2 pounds after being discharged from the hospital. She is weighing daily and her weight has been stable. She verbalized being prescribed an extra dose of Lasix, as needed, for any weight gain/extra fluid. Patient stated, her fluid restriction was decreased during her hospital stay to 1.5 mls daily. Patient uses Oxygen continuously at 2 liters and a Bipap at night. Patient explained her Oxygen reading was 87% on 12/27/16 and today it was in the 90's. She is continuing to monitor her Oxygen level throughout the day. Patient uses a nebulizer, as needed. Patient knows to contact her Cardiologist or Pulmonologist with any concerns. Patient stated, [facility]. Patient stated, she monitors her BG at home. Her Trulicity was stopped due to having low BG readings during her hospitalization. Patient stated, her Lantus was reduced to 15 units at night. Patient reported, she has a hearing aid to her right ear and she wears an amplifier to her left ear. Patient has a primary MD appointment on 01/12/17. She had a scheduled appointment with Dr. Bethanie DickerWayne Fischer, Pulmonologist on 12/31/16. Patient is active with [facility] (RN/PT).  Encounter Medications:  Outpatient Encounter Medications as of 01/03/2017  Medication Sig Note  . ACCU-CHEK AVIVA PLUS test strip Inject 1 applicator into the skin as needed.   Marland Kitchen. allopurinol (ZYLOPRIM) 300 MG tablet  Take 300 mg by mouth daily.   Marland Kitchen. amLODipine (NORVASC) 10 MG tablet Take 10 mg by mouth daily.   Marland Kitchen. aspirin 325 MG EC tablet Take 325 mg by mouth daily.   Marland Kitchen. atenolol (TENORMIN) 100 MG tablet Take 1 tablet (100 mg total) by mouth daily.   . Calcium Carb-Cholecalciferol (CALCIUM-VITAMIN D3) 500-400 MG-UNIT TABS Take 1 tablet by mouth 2 (two) times daily.   . Cholecalciferol 10000 units CAPS Take 1 capsule by mouth daily.   . Cyanocobalamin (VITAMIN B-12 PO) Take 1 tablet by mouth daily.   . Dulaglutide (TRULICITY) 0.75 MG/0.5ML SOPN Inject 1 application into the skin once a week.    . Fluticasone-Salmeterol (ADVAIR) 250-50 MCG/DOSE AEPB Inhale 1 puff into the lungs 2 (two) times daily.   . furosemide (LASIX) 40 MG tablet Take 1 tablet (40 mg total) by mouth 2 (two) times daily.   . Garlic 1000 MG CAPS Take 1,000 mg by mouth daily.   Marland Kitchen. glimepiride (AMARYL) 4 MG tablet Take 4 mg by mouth daily.    . hydrALAZINE (APRESOLINE) 25 MG tablet Take 25 mg by mouth every 8 (eight) hours.   . hydrOXYzine (ATARAX/VISTARIL) 10 MG tablet Take 10 mg by mouth 3 (three) times daily as needed.   Marland Kitchen. ibuprofen (ADVIL,MOTRIN) 600 MG tablet Take 1 tablet (600 mg total) by mouth every 6 (six) hours as needed.   Marland Kitchen. ipratropium-albuterol (DUONEB) 0.5-2.5 (3) MG/3ML SOLN Take 3 mLs by nebulization 2 (two) times daily.   Marland Kitchen. LANTUS SOLOSTAR 100 UNIT/ML Solostar Pen Inject 15 Units into the skin 2 (two) times daily.    Marland Kitchen. losartan (COZAAR) 50 MG tablet Take 50  mg by mouth daily.   . metFORMIN (GLUCOPHAGE) 1000 MG tablet Take 1,000 mg by mouth daily with breakfast.    . montelukast (SINGULAIR) 10 MG tablet Take 10 mg by mouth at bedtime.   . Multiple Vitamin (MULTIVITAMIN WITH MINERALS) TABS tablet Take 1 tablet by mouth daily.   . nitroGLYCERIN (NITROSTAT) 0.4 MG SL tablet Place 1 tablet (0.4 mg total) under the tongue every 5 (five) minutes as needed for chest pain.   . Omega-3 Fatty Acids (FISH OIL) 1000 MG CAPS Take 1,000 mg by  mouth 2 (two) times daily.    . potassium chloride SA (K-DUR,KLOR-CON) 20 MEQ tablet Take 2 tablets (40 mEq total) by mouth 2 (two) times daily. (Patient taking differently: Take 20 mEq by mouth once. ) 08/10/2016: Dose changed by Dr. Leanor Kail 8/18  . rosuvastatin (CRESTOR) 40 MG tablet Take 40 mg by mouth daily.    No facility-administered encounter medications on file as of 01/03/2017.     Functional Status:  In your present state of health, do you have any difficulty performing the following activities: 12/28/2016 08/18/2016  Hearing? Y N  Vision? N N  Difficulty concentrating or making decisions? N N  Walking or climbing stairs? N Y  Dressing or bathing? N Y  Doing errands, shopping? [name]  Preparing Food and eating ? N N  Using the Toilet? N N  In the past six months, have you accidently leaked urine? N N  Do you have problems with loss of bowel control? N N  Managing your Medications? Y N  Managing your Finances? N N  Housekeeping or managing your Housekeeping? N Y  Comment rest in between -  Some recent data might be hidden    Fall/Depression Screening: Fall Risk  12/28/2016 08/18/2016 08/03/2016  Falls in the past year? No No No  Risk for fall due to : - - (No Data)  Risk for fall due to: Comment - - Education provided on fall preventions- sitting on side of bed before rising, using cane or walker, remove any clutter or loose rugs, use a night light.   PHQ 2/9 Scores 12/28/2016 08/18/2016 04/25/2016 10/12/2015 09/29/2015  PHQ - 2 Score 0 0 0 0 0    Assessment:  [facility] CM Care Plan Problem One     Most Recent Value  Care Plan Problem One  Activity Intolerance R/T Weakness and Fatique  (Pended)   Role Documenting the Problem One  Care Management Telephonic Coordinator  (Pended)   Care Plan for Problem One  Active  (Pended)   [facility] Long Term Goal   Patient will practice regular exercise regimen of at least twice a week for the next 31 days.  (Pended)   [facility] Long Term Goal Start Date   12/28/16  (Pended)   Interventions for Problem One Long Term Goal  Emmi Handouts, education, discussion about group exercise programs, safety practices/distances for exercising with patient  (Pended)   [facility] CM Short Term Goal #1   Patient to keep a food diary and monitor nutritional intake for the next 30 days.  (Pended)   [facility] CM Short Term Goal #1 Start Date  12/28/16  (Pended)   Interventions for Short Term Goal #1  RN to mail to patient EMMI handouts on heart health diet.   (Pended)   [facility] CM Short Term Goal #2   Patient will walk twice a week within the next two weeks.  (Pended)   [facility] CM Short Term Goal #2  Start Date  12/28/16  (Pended)   Interventions for Short Term Goal #2  RN CM will provide patient with contact info for local [employer] and group exercise programs.   (Pended)      Plan:  RN CM will notify [facility] Case Management Assistant of case status. RN CM notified [facility] Case Management Assistant: patient agreed to services and case opened. RN CM will send primary MD barriers letter.   Wynelle Cleveland, RN, BSN, MHA/MSL, [facility] Telephonic Care Manager Coordinator [facility] Direct Phone: [phone] Cell Phone: [phone] Toll Free: [phone] Fax: [phone]

## 2017-01-04 ENCOUNTER — Other Ambulatory Visit: Payer: Self-pay | Admitting: *Deleted

## 2017-01-04 DIAGNOSIS — J441 Chronic obstructive pulmonary disease with (acute) exacerbation: Secondary | ICD-10-CM | POA: Diagnosis not present

## 2017-01-04 DIAGNOSIS — I110 Hypertensive heart disease with heart failure: Secondary | ICD-10-CM | POA: Diagnosis not present

## 2017-01-04 DIAGNOSIS — Z7982 Long term (current) use of aspirin: Secondary | ICD-10-CM | POA: Diagnosis not present

## 2017-01-04 DIAGNOSIS — E1165 Type 2 diabetes mellitus with hyperglycemia: Secondary | ICD-10-CM | POA: Diagnosis not present

## 2017-01-04 DIAGNOSIS — I5033 Acute on chronic diastolic (congestive) heart failure: Secondary | ICD-10-CM | POA: Diagnosis not present

## 2017-01-04 DIAGNOSIS — E785 Hyperlipidemia, unspecified: Secondary | ICD-10-CM | POA: Diagnosis not present

## 2017-01-04 DIAGNOSIS — M109 Gout, unspecified: Secondary | ICD-10-CM | POA: Diagnosis not present

## 2017-01-04 DIAGNOSIS — G4733 Obstructive sleep apnea (adult) (pediatric): Secondary | ICD-10-CM | POA: Diagnosis not present

## 2017-01-04 DIAGNOSIS — Z794 Long term (current) use of insulin: Secondary | ICD-10-CM | POA: Diagnosis not present

## 2017-01-04 NOTE — Patient Outreach (Addendum)
[facility]) Care Management  01/04/2017  Morgan Fischer 02/17/1950 [id number]   Telephone Assessment  Outgoing telephone call to patient. HIPAA identifiers verified X's 2. Patient confirmed having a visit with Dr. Eulis FosterBeauford on 12/31/16. She had a pulmonary function test (PFT) completed during the visit. Per patient, the PFT showed no improvement or decline from the previous results in 2017. Patient stated, no changes were made to her plan of treatment. Patient voiced concern about getting inside of her primary MD's office due to the elevated steps. She plans to ask the office staff is there another option to enter the building other than the steps. Patient reported, she was hospitalized 3 times in 2018, which could have been related to her husband's death. Patient is willing to make improvements to her health conditions. She doesn't want to go to a gym, however she is willing to layout an exercise routine at home. Patient stated, she was given a Living with COPD book by PT that shows various exercise routines. She plans to begin exercising today when PT arrives. Patient reported her last weight at 307.4 pounds; a decrease of 2.6 pounds from last week. Patient voiced concern about her weight fluctuating one pound. RN CM educated patient about weight gain guidelines for heart failure and heart failure zones located in [facility] calendar. RN CM and patient discussed results of patient's most recent echo. Patient was not knowledgeable of results of echo. RN CM encouraged patient to speak with Cardiologist regarding results and patient agreed. Patient reported, she doesn't have an upcoming appointment with her Cardiologist. Patient informed RN CM will contact Cardiologist to initiate a hospital discharge follow-up appointment. She transitioned to Dr. Dot Fischer about a year ago and she has only seen him once.  RN CM and patient discussed a heart failure action plan. Patient encouraged to discuss a heart  failure plan with her Cardiologist.    Plan: RN CM will send patient EMMI educational materials. RN CM will contact patient within one week.  RN CM will send written consent to patient.  RN CM to contact patient's Cardiologist.    Wynelle ClevelandJuanita Jennilee Demarco, RN, BSN, MHA/MSL, [facility] Telephonic Care Manager Coordinator [facility] Direct Phone: [phone] Cell Phone: [phone] Toll Free: [phone] Fax: [phone]

## 2017-01-05 DIAGNOSIS — B351 Tinea unguium: Secondary | ICD-10-CM | POA: Diagnosis not present

## 2017-01-05 DIAGNOSIS — L84 Corns and callosities: Secondary | ICD-10-CM | POA: Diagnosis not present

## 2017-01-05 DIAGNOSIS — E1159 Type 2 diabetes mellitus with other circulatory complications: Secondary | ICD-10-CM | POA: Diagnosis not present

## 2017-01-05 DIAGNOSIS — M79672 Pain in left foot: Secondary | ICD-10-CM | POA: Diagnosis not present

## 2017-01-05 DIAGNOSIS — M79671 Pain in right foot: Secondary | ICD-10-CM | POA: Diagnosis not present

## 2017-01-08 DIAGNOSIS — I110 Hypertensive heart disease with heart failure: Secondary | ICD-10-CM | POA: Diagnosis not present

## 2017-01-08 DIAGNOSIS — E785 Hyperlipidemia, unspecified: Secondary | ICD-10-CM | POA: Diagnosis not present

## 2017-01-08 DIAGNOSIS — Z794 Long term (current) use of insulin: Secondary | ICD-10-CM | POA: Diagnosis not present

## 2017-01-08 DIAGNOSIS — J441 Chronic obstructive pulmonary disease with (acute) exacerbation: Secondary | ICD-10-CM | POA: Diagnosis not present

## 2017-01-08 DIAGNOSIS — Z7982 Long term (current) use of aspirin: Secondary | ICD-10-CM | POA: Diagnosis not present

## 2017-01-08 DIAGNOSIS — I5033 Acute on chronic diastolic (congestive) heart failure: Secondary | ICD-10-CM | POA: Diagnosis not present

## 2017-01-08 DIAGNOSIS — G4733 Obstructive sleep apnea (adult) (pediatric): Secondary | ICD-10-CM | POA: Diagnosis not present

## 2017-01-08 DIAGNOSIS — M109 Gout, unspecified: Secondary | ICD-10-CM | POA: Diagnosis not present

## 2017-01-08 DIAGNOSIS — E1165 Type 2 diabetes mellitus with hyperglycemia: Secondary | ICD-10-CM | POA: Diagnosis not present

## 2017-01-09 DIAGNOSIS — G4733 Obstructive sleep apnea (adult) (pediatric): Secondary | ICD-10-CM | POA: Diagnosis not present

## 2017-01-09 DIAGNOSIS — I5033 Acute on chronic diastolic (congestive) heart failure: Secondary | ICD-10-CM | POA: Diagnosis not present

## 2017-01-09 DIAGNOSIS — I110 Hypertensive heart disease with heart failure: Secondary | ICD-10-CM | POA: Diagnosis not present

## 2017-01-09 DIAGNOSIS — Z794 Long term (current) use of insulin: Secondary | ICD-10-CM | POA: Diagnosis not present

## 2017-01-09 DIAGNOSIS — M109 Gout, unspecified: Secondary | ICD-10-CM | POA: Diagnosis not present

## 2017-01-09 DIAGNOSIS — J441 Chronic obstructive pulmonary disease with (acute) exacerbation: Secondary | ICD-10-CM | POA: Diagnosis not present

## 2017-01-09 DIAGNOSIS — E785 Hyperlipidemia, unspecified: Secondary | ICD-10-CM | POA: Diagnosis not present

## 2017-01-09 DIAGNOSIS — E1165 Type 2 diabetes mellitus with hyperglycemia: Secondary | ICD-10-CM | POA: Diagnosis not present

## 2017-01-09 DIAGNOSIS — Z7982 Long term (current) use of aspirin: Secondary | ICD-10-CM | POA: Diagnosis not present

## 2017-01-11 DIAGNOSIS — E1165 Type 2 diabetes mellitus with hyperglycemia: Secondary | ICD-10-CM | POA: Diagnosis not present

## 2017-01-11 DIAGNOSIS — J441 Chronic obstructive pulmonary disease with (acute) exacerbation: Secondary | ICD-10-CM | POA: Diagnosis not present

## 2017-01-11 DIAGNOSIS — B351 Tinea unguium: Secondary | ICD-10-CM | POA: Diagnosis not present

## 2017-01-11 DIAGNOSIS — Z794 Long term (current) use of insulin: Secondary | ICD-10-CM | POA: Diagnosis not present

## 2017-01-11 DIAGNOSIS — M79671 Pain in right foot: Secondary | ICD-10-CM | POA: Diagnosis not present

## 2017-01-11 DIAGNOSIS — M79672 Pain in left foot: Secondary | ICD-10-CM | POA: Diagnosis not present

## 2017-01-11 DIAGNOSIS — Z7982 Long term (current) use of aspirin: Secondary | ICD-10-CM | POA: Diagnosis not present

## 2017-01-11 DIAGNOSIS — L84 Corns and callosities: Secondary | ICD-10-CM | POA: Diagnosis not present

## 2017-01-11 DIAGNOSIS — M109 Gout, unspecified: Secondary | ICD-10-CM | POA: Diagnosis not present

## 2017-01-11 DIAGNOSIS — I110 Hypertensive heart disease with heart failure: Secondary | ICD-10-CM | POA: Diagnosis not present

## 2017-01-11 DIAGNOSIS — I5033 Acute on chronic diastolic (congestive) heart failure: Secondary | ICD-10-CM | POA: Diagnosis not present

## 2017-01-11 DIAGNOSIS — E1159 Type 2 diabetes mellitus with other circulatory complications: Secondary | ICD-10-CM | POA: Diagnosis not present

## 2017-01-11 DIAGNOSIS — G4733 Obstructive sleep apnea (adult) (pediatric): Secondary | ICD-10-CM | POA: Diagnosis not present

## 2017-01-11 DIAGNOSIS — E785 Hyperlipidemia, unspecified: Secondary | ICD-10-CM | POA: Diagnosis not present

## 2017-01-12 DIAGNOSIS — J449 Chronic obstructive pulmonary disease, unspecified: Secondary | ICD-10-CM | POA: Diagnosis not present

## 2017-01-12 DIAGNOSIS — I10 Essential (primary) hypertension: Secondary | ICD-10-CM | POA: Diagnosis not present

## 2017-01-12 DIAGNOSIS — G4700 Insomnia, unspecified: Secondary | ICD-10-CM | POA: Diagnosis not present

## 2017-01-12 DIAGNOSIS — I119 Hypertensive heart disease without heart failure: Secondary | ICD-10-CM | POA: Diagnosis not present

## 2017-01-12 DIAGNOSIS — J45909 Unspecified asthma, uncomplicated: Secondary | ICD-10-CM | POA: Diagnosis not present

## 2017-01-12 DIAGNOSIS — I429 Cardiomyopathy, unspecified: Secondary | ICD-10-CM | POA: Diagnosis not present

## 2017-01-12 DIAGNOSIS — E119 Type 2 diabetes mellitus without complications: Secondary | ICD-10-CM | POA: Diagnosis not present

## 2017-01-12 DIAGNOSIS — G4733 Obstructive sleep apnea (adult) (pediatric): Secondary | ICD-10-CM | POA: Diagnosis not present

## 2017-01-12 DIAGNOSIS — E785 Hyperlipidemia, unspecified: Secondary | ICD-10-CM | POA: Diagnosis not present

## 2017-01-12 DIAGNOSIS — Z1389 Encounter for screening for other disorder: Secondary | ICD-10-CM | POA: Diagnosis not present

## 2017-01-15 DIAGNOSIS — M109 Gout, unspecified: Secondary | ICD-10-CM | POA: Diagnosis not present

## 2017-01-15 DIAGNOSIS — E785 Hyperlipidemia, unspecified: Secondary | ICD-10-CM | POA: Diagnosis not present

## 2017-01-15 DIAGNOSIS — E1165 Type 2 diabetes mellitus with hyperglycemia: Secondary | ICD-10-CM | POA: Diagnosis not present

## 2017-01-15 DIAGNOSIS — I110 Hypertensive heart disease with heart failure: Secondary | ICD-10-CM | POA: Diagnosis not present

## 2017-01-15 DIAGNOSIS — Z794 Long term (current) use of insulin: Secondary | ICD-10-CM | POA: Diagnosis not present

## 2017-01-15 DIAGNOSIS — G4733 Obstructive sleep apnea (adult) (pediatric): Secondary | ICD-10-CM | POA: Diagnosis not present

## 2017-01-15 DIAGNOSIS — J441 Chronic obstructive pulmonary disease with (acute) exacerbation: Secondary | ICD-10-CM | POA: Diagnosis not present

## 2017-01-15 DIAGNOSIS — I5033 Acute on chronic diastolic (congestive) heart failure: Secondary | ICD-10-CM | POA: Diagnosis not present

## 2017-01-15 DIAGNOSIS — Z7982 Long term (current) use of aspirin: Secondary | ICD-10-CM | POA: Diagnosis not present

## 2017-01-17 DIAGNOSIS — E1165 Type 2 diabetes mellitus with hyperglycemia: Secondary | ICD-10-CM | POA: Diagnosis not present

## 2017-01-17 DIAGNOSIS — Z7982 Long term (current) use of aspirin: Secondary | ICD-10-CM | POA: Diagnosis not present

## 2017-01-17 DIAGNOSIS — J441 Chronic obstructive pulmonary disease with (acute) exacerbation: Secondary | ICD-10-CM | POA: Diagnosis not present

## 2017-01-17 DIAGNOSIS — E785 Hyperlipidemia, unspecified: Secondary | ICD-10-CM | POA: Diagnosis not present

## 2017-01-17 DIAGNOSIS — I110 Hypertensive heart disease with heart failure: Secondary | ICD-10-CM | POA: Diagnosis not present

## 2017-01-17 DIAGNOSIS — I5033 Acute on chronic diastolic (congestive) heart failure: Secondary | ICD-10-CM | POA: Diagnosis not present

## 2017-01-17 DIAGNOSIS — M109 Gout, unspecified: Secondary | ICD-10-CM | POA: Diagnosis not present

## 2017-01-17 DIAGNOSIS — G4733 Obstructive sleep apnea (adult) (pediatric): Secondary | ICD-10-CM | POA: Diagnosis not present

## 2017-01-17 DIAGNOSIS — Z794 Long term (current) use of insulin: Secondary | ICD-10-CM | POA: Diagnosis not present

## 2017-01-18 ENCOUNTER — Ambulatory Visit: Payer: Self-pay | Admitting: *Deleted

## 2017-01-18 ENCOUNTER — Other Ambulatory Visit: Payer: Self-pay | Admitting: *Deleted

## 2017-01-18 DIAGNOSIS — M356 Relapsing panniculitis [Weber-Christian]: Secondary | ICD-10-CM | POA: Diagnosis not present

## 2017-01-18 DIAGNOSIS — J449 Chronic obstructive pulmonary disease, unspecified: Secondary | ICD-10-CM | POA: Diagnosis not present

## 2017-01-18 DIAGNOSIS — J45909 Unspecified asthma, uncomplicated: Secondary | ICD-10-CM | POA: Diagnosis not present

## 2017-01-18 DIAGNOSIS — R269 Unspecified abnormalities of gait and mobility: Secondary | ICD-10-CM | POA: Diagnosis not present

## 2017-01-18 NOTE — Patient Outreach (Addendum)
[facility]) Care Management  01/18/2017  Morgan Fischer 11/28/1950 [id number]  Telephone Assessment   Referral Date: 12/28/16 Referral Source: [employer] Date of Discharge: 12/15/16 [facility] Discharge Diagnosis: Respiratory Complications Insurance: [facility]   Outreach telephone call to patient. HIPAA identifiers verified with patient. Patient had a primary MD visit last week and her next appointment is within 4 weeks. Per patient, her HGB A1C was 7.1, which the MD said could be lower. Patient stated, no changes were made during her visit. Patient was concerned about her hemoglobin level of 10.4. She stated, it was 9.4 during her hospital admission. RN CM explained to patient that her hemoglobin is improving. She will be having labs drawn again in March to re-evaluate her hemoglobin. Patient reported, her weight at 307 pounds. She voiced concerned about not having any weight loss. Patient wants to lose 100 pounds long term. Her short term goal is to weigh less than 300 pounds. She verbalized exercising with PT, performing "COPD exercises", and eating a low sodium/carb modified diet. RN CM discussed with patient about her weight being stable, although she hasn't had any weight loss. RN CM asked patient if she would be interested in visiting with a nutritionist/dietician and she declined. Patient reported, her last BG at 130, B/P -140/66, 99% on 2 L, and pulse of 63. RN CM discussed with patient regarding her increase in B/P. Patient believes her B/P may be related to taking care of her deceased husband's affairs. Patient reported, she is not sleeping well during the night. She continues to take Vistaril, which remains ineffective. Patient encouraged to speak with her MD about changing her medication. Patient reported, she has symptoms of coldness and her "hair is falling out".  Patient encouraged to speak with the MD about her Thyroid  levels. Patient reported, she is in the green zone with her heart failure. RN CM provided patient with her appointment with Dr. Dot Beenohrbeck on 02/13/17 at 0945. RN CM encouraged patient to write down her questions within her [facility] to carry to her next MD appointment.  Plan: RN CM will contact patient within one week. RN CM advised patient to contact RNCM for any needs or concerns.   Wynelle ClevelandJuanita Lamaya Hyneman, RN, BSN, MHA/MSL, [facility] Telephonic Care Manager Coordinator [facility] Direct Phone: [phone] Cell Phone: [phone] Toll Free: [phone] Fax: [phone]

## 2017-01-19 DIAGNOSIS — E785 Hyperlipidemia, unspecified: Secondary | ICD-10-CM | POA: Diagnosis not present

## 2017-01-19 DIAGNOSIS — M109 Gout, unspecified: Secondary | ICD-10-CM | POA: Diagnosis not present

## 2017-01-19 DIAGNOSIS — Z7982 Long term (current) use of aspirin: Secondary | ICD-10-CM | POA: Diagnosis not present

## 2017-01-19 DIAGNOSIS — I5033 Acute on chronic diastolic (congestive) heart failure: Secondary | ICD-10-CM | POA: Diagnosis not present

## 2017-01-19 DIAGNOSIS — J441 Chronic obstructive pulmonary disease with (acute) exacerbation: Secondary | ICD-10-CM | POA: Diagnosis not present

## 2017-01-19 DIAGNOSIS — I110 Hypertensive heart disease with heart failure: Secondary | ICD-10-CM | POA: Diagnosis not present

## 2017-01-19 DIAGNOSIS — G4733 Obstructive sleep apnea (adult) (pediatric): Secondary | ICD-10-CM | POA: Diagnosis not present

## 2017-01-19 DIAGNOSIS — E1165 Type 2 diabetes mellitus with hyperglycemia: Secondary | ICD-10-CM | POA: Diagnosis not present

## 2017-01-19 DIAGNOSIS — Z794 Long term (current) use of insulin: Secondary | ICD-10-CM | POA: Diagnosis not present

## 2017-01-22 DIAGNOSIS — I110 Hypertensive heart disease with heart failure: Secondary | ICD-10-CM | POA: Diagnosis not present

## 2017-01-22 DIAGNOSIS — J449 Chronic obstructive pulmonary disease, unspecified: Secondary | ICD-10-CM | POA: Diagnosis not present

## 2017-01-22 DIAGNOSIS — E785 Hyperlipidemia, unspecified: Secondary | ICD-10-CM | POA: Diagnosis not present

## 2017-01-22 DIAGNOSIS — I5033 Acute on chronic diastolic (congestive) heart failure: Secondary | ICD-10-CM | POA: Diagnosis not present

## 2017-01-22 DIAGNOSIS — Z7982 Long term (current) use of aspirin: Secondary | ICD-10-CM | POA: Diagnosis not present

## 2017-01-22 DIAGNOSIS — Z794 Long term (current) use of insulin: Secondary | ICD-10-CM | POA: Diagnosis not present

## 2017-01-22 DIAGNOSIS — J45909 Unspecified asthma, uncomplicated: Secondary | ICD-10-CM | POA: Diagnosis not present

## 2017-01-22 DIAGNOSIS — E1165 Type 2 diabetes mellitus with hyperglycemia: Secondary | ICD-10-CM | POA: Diagnosis not present

## 2017-01-22 DIAGNOSIS — M109 Gout, unspecified: Secondary | ICD-10-CM | POA: Diagnosis not present

## 2017-01-22 DIAGNOSIS — G4733 Obstructive sleep apnea (adult) (pediatric): Secondary | ICD-10-CM | POA: Diagnosis not present

## 2017-01-22 DIAGNOSIS — J441 Chronic obstructive pulmonary disease with (acute) exacerbation: Secondary | ICD-10-CM | POA: Diagnosis not present

## 2017-01-25 ENCOUNTER — Ambulatory Visit: Payer: Self-pay | Admitting: *Deleted

## 2017-01-25 ENCOUNTER — Other Ambulatory Visit: Payer: Self-pay | Admitting: *Deleted

## 2017-01-25 NOTE — Patient Outreach (Signed)
[facility]) Care Management  01/25/2017  Morgan BeachJulia Fischer 03/18/1950 [id number]   Telephone Assessment  Telephone outreach to patient and HIPAA identifiers verified. Based on the heart failure zone, patient believes she is in the green. She reported her last weight at 301 pounds, which decreased from 307.4 pounds. She was delighted with her weight loss. Patient misplaced her BP cuff. Patient's last BG reading was 100 and pulse of 99 . Per patient, she attempts to consume 1200 calories per day. Patient asked about Glucerna shaker. RN CM explained to patient about the purpose/reasoning for Nutritional Shakes. Patient stated, PT services are in the process of getting recertified to continue home health services. Patient stated, she didn't;t have a visit this week from home health. Patient reported, she continues to have difficulty with staying asleep, although she is managing. Patient asked what is the difference regarding palliative care and Hospice. RN CM and patient discussed the differences between the two. Patient stated, the home health services wants to speak with her primary MD about palliative services for a continuity of care. Patient stated, she is not ready to accept palliative services, currently. Per patient, Hospice was involved last year with her husband's death. RN CM plans to send patient EMMI educational materials about palliative care.  Plan: RN CM will contact patient within one week.  RN CM will send patient EMMI educational materials.  Morgan ClevelandJuanita Dianna Deshler, RN, BSN, MHA/MSL, [facility] Fischer Direct Phone: [phone] Cell Phone: [phone] Toll Free: [phone] Fax: [phone]

## 2017-01-26 DIAGNOSIS — E662 Morbid (severe) obesity with alveolar hypoventilation: Secondary | ICD-10-CM | POA: Diagnosis not present

## 2017-01-26 DIAGNOSIS — M109 Gout, unspecified: Secondary | ICD-10-CM | POA: Diagnosis not present

## 2017-01-26 DIAGNOSIS — I110 Hypertensive heart disease with heart failure: Secondary | ICD-10-CM | POA: Diagnosis not present

## 2017-01-26 DIAGNOSIS — E1165 Type 2 diabetes mellitus with hyperglycemia: Secondary | ICD-10-CM | POA: Diagnosis not present

## 2017-01-26 DIAGNOSIS — J9622 Acute and chronic respiratory failure with hypercapnia: Secondary | ICD-10-CM | POA: Diagnosis not present

## 2017-01-26 DIAGNOSIS — J9621 Acute and chronic respiratory failure with hypoxia: Secondary | ICD-10-CM | POA: Diagnosis not present

## 2017-01-26 DIAGNOSIS — E785 Hyperlipidemia, unspecified: Secondary | ICD-10-CM | POA: Diagnosis not present

## 2017-01-26 DIAGNOSIS — J441 Chronic obstructive pulmonary disease with (acute) exacerbation: Secondary | ICD-10-CM | POA: Diagnosis not present

## 2017-01-26 DIAGNOSIS — I5033 Acute on chronic diastolic (congestive) heart failure: Secondary | ICD-10-CM | POA: Diagnosis not present

## 2017-01-29 DIAGNOSIS — M109 Gout, unspecified: Secondary | ICD-10-CM | POA: Diagnosis not present

## 2017-01-29 DIAGNOSIS — E662 Morbid (severe) obesity with alveolar hypoventilation: Secondary | ICD-10-CM | POA: Diagnosis not present

## 2017-01-29 DIAGNOSIS — J9621 Acute and chronic respiratory failure with hypoxia: Secondary | ICD-10-CM | POA: Diagnosis not present

## 2017-01-29 DIAGNOSIS — E1165 Type 2 diabetes mellitus with hyperglycemia: Secondary | ICD-10-CM | POA: Diagnosis not present

## 2017-01-29 DIAGNOSIS — J9622 Acute and chronic respiratory failure with hypercapnia: Secondary | ICD-10-CM | POA: Diagnosis not present

## 2017-01-29 DIAGNOSIS — I5033 Acute on chronic diastolic (congestive) heart failure: Secondary | ICD-10-CM | POA: Diagnosis not present

## 2017-01-29 DIAGNOSIS — I110 Hypertensive heart disease with heart failure: Secondary | ICD-10-CM | POA: Diagnosis not present

## 2017-01-29 DIAGNOSIS — E785 Hyperlipidemia, unspecified: Secondary | ICD-10-CM | POA: Diagnosis not present

## 2017-01-29 DIAGNOSIS — J441 Chronic obstructive pulmonary disease with (acute) exacerbation: Secondary | ICD-10-CM | POA: Diagnosis not present

## 2017-01-31 DIAGNOSIS — I5033 Acute on chronic diastolic (congestive) heart failure: Secondary | ICD-10-CM | POA: Diagnosis not present

## 2017-01-31 DIAGNOSIS — E1165 Type 2 diabetes mellitus with hyperglycemia: Secondary | ICD-10-CM | POA: Diagnosis not present

## 2017-01-31 DIAGNOSIS — I110 Hypertensive heart disease with heart failure: Secondary | ICD-10-CM | POA: Diagnosis not present

## 2017-01-31 DIAGNOSIS — E662 Morbid (severe) obesity with alveolar hypoventilation: Secondary | ICD-10-CM | POA: Diagnosis not present

## 2017-01-31 DIAGNOSIS — J9621 Acute and chronic respiratory failure with hypoxia: Secondary | ICD-10-CM | POA: Diagnosis not present

## 2017-01-31 DIAGNOSIS — J9622 Acute and chronic respiratory failure with hypercapnia: Secondary | ICD-10-CM | POA: Diagnosis not present

## 2017-01-31 DIAGNOSIS — J441 Chronic obstructive pulmonary disease with (acute) exacerbation: Secondary | ICD-10-CM | POA: Diagnosis not present

## 2017-01-31 DIAGNOSIS — E785 Hyperlipidemia, unspecified: Secondary | ICD-10-CM | POA: Diagnosis not present

## 2017-01-31 DIAGNOSIS — M109 Gout, unspecified: Secondary | ICD-10-CM | POA: Diagnosis not present

## 2017-02-01 ENCOUNTER — Ambulatory Visit: Payer: Self-pay | Admitting: *Deleted

## 2017-02-01 ENCOUNTER — Other Ambulatory Visit: Payer: Self-pay | Admitting: *Deleted

## 2017-02-01 NOTE — Patient Outreach (Signed)
[facility]) Care Management  02/01/2017  Morgan BeachJulia Chien 02/29/1950 [id number]  Telephone Assessment  Outgoing telephone call to patient and HIPAA verified. Patient reported, she is in the green zone today. She verbalized not being able to locate her B/P monitor to continue with her daily readings. RN CM asked patient if she could provide her with another B/P monitor and patient agreed. Patient reported, her last weight at 299 pounds, O2 sats 99% on 2 liters, and pulse of 60. Patient has noticed increased swelling and pain in her lower extremities while sitting up. Patient stated, she monitors her fluid intake, mentally. She doesn't document her fluid intake. Per patient, her normal routine is to drink 3 bottles of water per day and to include food items that are liquid at room temperature. Patient stated, she eliminates a bottle of water, if she has a 12 ounce cup of coffee. RN CM encouraged patient to document her fluid intake to get an accurate intake. Patient will continue to work with [facility] PT for 3 more weeks and she will have an RN to start in March. Patient stated, she [facility]'t receive [facility] to her last week regarding palliative care. RN CM plans to follow-up about with Case Management Administrative Assistance.   Update @ 2030 Incoming telephone call from patient. Patient reported, she [facility] today.  Plan: RN CM will contact patient within one week.  RN CM will send patient EMMI educational materials.  RN CM advised patient to contact RN CM for any needs or concerns.  Wynelle ClevelandJuanita Khiem Gargis, RN, BSN, MHA/MSL, [facility] Telephonic Care Manager Coordinator [facility] Direct Phone: [phone] Cell Phone: [phone] Toll Free: [phone] Fax: [phone]

## 2017-02-05 DIAGNOSIS — I110 Hypertensive heart disease with heart failure: Secondary | ICD-10-CM | POA: Diagnosis not present

## 2017-02-05 DIAGNOSIS — M109 Gout, unspecified: Secondary | ICD-10-CM | POA: Diagnosis not present

## 2017-02-05 DIAGNOSIS — E1165 Type 2 diabetes mellitus with hyperglycemia: Secondary | ICD-10-CM | POA: Diagnosis not present

## 2017-02-05 DIAGNOSIS — E662 Morbid (severe) obesity with alveolar hypoventilation: Secondary | ICD-10-CM | POA: Diagnosis not present

## 2017-02-05 DIAGNOSIS — J441 Chronic obstructive pulmonary disease with (acute) exacerbation: Secondary | ICD-10-CM | POA: Diagnosis not present

## 2017-02-05 DIAGNOSIS — E785 Hyperlipidemia, unspecified: Secondary | ICD-10-CM | POA: Diagnosis not present

## 2017-02-05 DIAGNOSIS — J9622 Acute and chronic respiratory failure with hypercapnia: Secondary | ICD-10-CM | POA: Diagnosis not present

## 2017-02-05 DIAGNOSIS — J9621 Acute and chronic respiratory failure with hypoxia: Secondary | ICD-10-CM | POA: Diagnosis not present

## 2017-02-05 DIAGNOSIS — I5033 Acute on chronic diastolic (congestive) heart failure: Secondary | ICD-10-CM | POA: Diagnosis not present

## 2017-02-07 DIAGNOSIS — I110 Hypertensive heart disease with heart failure: Secondary | ICD-10-CM | POA: Diagnosis not present

## 2017-02-07 DIAGNOSIS — M109 Gout, unspecified: Secondary | ICD-10-CM | POA: Diagnosis not present

## 2017-02-07 DIAGNOSIS — J9621 Acute and chronic respiratory failure with hypoxia: Secondary | ICD-10-CM | POA: Diagnosis not present

## 2017-02-07 DIAGNOSIS — E1165 Type 2 diabetes mellitus with hyperglycemia: Secondary | ICD-10-CM | POA: Diagnosis not present

## 2017-02-07 DIAGNOSIS — J441 Chronic obstructive pulmonary disease with (acute) exacerbation: Secondary | ICD-10-CM | POA: Diagnosis not present

## 2017-02-07 DIAGNOSIS — J9622 Acute and chronic respiratory failure with hypercapnia: Secondary | ICD-10-CM | POA: Diagnosis not present

## 2017-02-07 DIAGNOSIS — I5033 Acute on chronic diastolic (congestive) heart failure: Secondary | ICD-10-CM | POA: Diagnosis not present

## 2017-02-07 DIAGNOSIS — E662 Morbid (severe) obesity with alveolar hypoventilation: Secondary | ICD-10-CM | POA: Diagnosis not present

## 2017-02-07 DIAGNOSIS — E785 Hyperlipidemia, unspecified: Secondary | ICD-10-CM | POA: Diagnosis not present

## 2017-02-08 ENCOUNTER — Other Ambulatory Visit: Payer: Self-pay | Admitting: *Deleted

## 2017-02-09 NOTE — Patient Outreach (Signed)
[facility]) Care Management  02/09/2017  Morgan BeachJulia Fischer 04/30/1950 [id number]  Telephone Assessment  Outgoing telephone call to patient. HIPAA identifiers X's 2 verified. Patient reported, she is in the green zone today. Patient verbalized monitoring and documenting her fluid intake. She consumed an estimated 33.8 oz of fluids today, per patient. Patient reported, her Oxygen 97% at 2L, pulse 66, weight 297.6, and BG 120. Patient stated, PT checked her B/P and it was 120/66. Patient continues to portion her meals, eliminate carry out, and select healthier foods. Patient stated, [facility]. She will have a personal care aide assisting with her care throughout the week.   Plan: RN CM will contact patient within one month.  Wynelle ClevelandJuanita Morgan Lipuma, RN, BSN, MHA/MSL, [facility] Telephonic Care Manager Coordinator [facility] Direct Phone: [phone] Cell Phone: [phone] Toll Free: [phone] Fax: [phone]

## 2017-02-12 DIAGNOSIS — J45909 Unspecified asthma, uncomplicated: Secondary | ICD-10-CM | POA: Diagnosis not present

## 2017-02-12 DIAGNOSIS — J449 Chronic obstructive pulmonary disease, unspecified: Secondary | ICD-10-CM | POA: Diagnosis not present

## 2017-02-13 DIAGNOSIS — E119 Type 2 diabetes mellitus without complications: Secondary | ICD-10-CM | POA: Diagnosis not present

## 2017-02-13 DIAGNOSIS — I350 Nonrheumatic aortic (valve) stenosis: Secondary | ICD-10-CM | POA: Diagnosis not present

## 2017-02-13 DIAGNOSIS — E7800 Pure hypercholesterolemia, unspecified: Secondary | ICD-10-CM

## 2017-02-13 DIAGNOSIS — I10 Essential (primary) hypertension: Secondary | ICD-10-CM | POA: Diagnosis not present

## 2017-02-13 DIAGNOSIS — I2510 Atherosclerotic heart disease of native coronary artery without angina pectoris: Secondary | ICD-10-CM | POA: Diagnosis not present

## 2017-02-13 HISTORY — DX: Pure hypercholesterolemia, unspecified: E78.00

## 2017-02-14 DIAGNOSIS — J9622 Acute and chronic respiratory failure with hypercapnia: Secondary | ICD-10-CM | POA: Diagnosis not present

## 2017-02-14 DIAGNOSIS — J9621 Acute and chronic respiratory failure with hypoxia: Secondary | ICD-10-CM | POA: Diagnosis not present

## 2017-02-14 DIAGNOSIS — E1165 Type 2 diabetes mellitus with hyperglycemia: Secondary | ICD-10-CM | POA: Diagnosis not present

## 2017-02-14 DIAGNOSIS — I110 Hypertensive heart disease with heart failure: Secondary | ICD-10-CM | POA: Diagnosis not present

## 2017-02-14 DIAGNOSIS — I2781 Cor pulmonale (chronic): Secondary | ICD-10-CM | POA: Diagnosis not present

## 2017-02-14 DIAGNOSIS — E668 Other obesity: Secondary | ICD-10-CM | POA: Diagnosis not present

## 2017-02-14 DIAGNOSIS — E785 Hyperlipidemia, unspecified: Secondary | ICD-10-CM | POA: Diagnosis not present

## 2017-02-14 DIAGNOSIS — J441 Chronic obstructive pulmonary disease with (acute) exacerbation: Secondary | ICD-10-CM | POA: Diagnosis not present

## 2017-02-14 DIAGNOSIS — M109 Gout, unspecified: Secondary | ICD-10-CM | POA: Diagnosis not present

## 2017-02-14 DIAGNOSIS — R0602 Shortness of breath: Secondary | ICD-10-CM | POA: Diagnosis not present

## 2017-02-14 DIAGNOSIS — I5033 Acute on chronic diastolic (congestive) heart failure: Secondary | ICD-10-CM | POA: Diagnosis not present

## 2017-02-14 DIAGNOSIS — G4730 Sleep apnea, unspecified: Secondary | ICD-10-CM | POA: Diagnosis not present

## 2017-02-14 DIAGNOSIS — E662 Morbid (severe) obesity with alveolar hypoventilation: Secondary | ICD-10-CM | POA: Diagnosis not present

## 2017-02-15 DIAGNOSIS — E1165 Type 2 diabetes mellitus with hyperglycemia: Secondary | ICD-10-CM | POA: Diagnosis not present

## 2017-02-15 DIAGNOSIS — E662 Morbid (severe) obesity with alveolar hypoventilation: Secondary | ICD-10-CM | POA: Diagnosis not present

## 2017-02-15 DIAGNOSIS — E785 Hyperlipidemia, unspecified: Secondary | ICD-10-CM | POA: Diagnosis not present

## 2017-02-15 DIAGNOSIS — I110 Hypertensive heart disease with heart failure: Secondary | ICD-10-CM | POA: Diagnosis not present

## 2017-02-15 DIAGNOSIS — M109 Gout, unspecified: Secondary | ICD-10-CM | POA: Diagnosis not present

## 2017-02-15 DIAGNOSIS — J9621 Acute and chronic respiratory failure with hypoxia: Secondary | ICD-10-CM | POA: Diagnosis not present

## 2017-02-15 DIAGNOSIS — J441 Chronic obstructive pulmonary disease with (acute) exacerbation: Secondary | ICD-10-CM | POA: Diagnosis not present

## 2017-02-15 DIAGNOSIS — J9622 Acute and chronic respiratory failure with hypercapnia: Secondary | ICD-10-CM | POA: Diagnosis not present

## 2017-02-15 DIAGNOSIS — I5033 Acute on chronic diastolic (congestive) heart failure: Secondary | ICD-10-CM | POA: Diagnosis not present

## 2017-02-18 DIAGNOSIS — R269 Unspecified abnormalities of gait and mobility: Secondary | ICD-10-CM | POA: Diagnosis not present

## 2017-02-18 DIAGNOSIS — M356 Relapsing panniculitis [Weber-Christian]: Secondary | ICD-10-CM | POA: Diagnosis not present

## 2017-02-18 DIAGNOSIS — J449 Chronic obstructive pulmonary disease, unspecified: Secondary | ICD-10-CM | POA: Diagnosis not present

## 2017-02-18 DIAGNOSIS — J45909 Unspecified asthma, uncomplicated: Secondary | ICD-10-CM | POA: Diagnosis not present

## 2017-02-22 DIAGNOSIS — J45909 Unspecified asthma, uncomplicated: Secondary | ICD-10-CM | POA: Diagnosis not present

## 2017-02-22 DIAGNOSIS — J449 Chronic obstructive pulmonary disease, unspecified: Secondary | ICD-10-CM | POA: Diagnosis not present

## 2017-02-23 DIAGNOSIS — I5033 Acute on chronic diastolic (congestive) heart failure: Secondary | ICD-10-CM | POA: Diagnosis not present

## 2017-02-23 DIAGNOSIS — E1165 Type 2 diabetes mellitus with hyperglycemia: Secondary | ICD-10-CM | POA: Diagnosis not present

## 2017-02-23 DIAGNOSIS — E785 Hyperlipidemia, unspecified: Secondary | ICD-10-CM | POA: Diagnosis not present

## 2017-02-23 DIAGNOSIS — J441 Chronic obstructive pulmonary disease with (acute) exacerbation: Secondary | ICD-10-CM | POA: Diagnosis not present

## 2017-02-23 DIAGNOSIS — J9622 Acute and chronic respiratory failure with hypercapnia: Secondary | ICD-10-CM | POA: Diagnosis not present

## 2017-02-23 DIAGNOSIS — M109 Gout, unspecified: Secondary | ICD-10-CM | POA: Diagnosis not present

## 2017-02-23 DIAGNOSIS — I110 Hypertensive heart disease with heart failure: Secondary | ICD-10-CM | POA: Diagnosis not present

## 2017-02-23 DIAGNOSIS — J9621 Acute and chronic respiratory failure with hypoxia: Secondary | ICD-10-CM | POA: Diagnosis not present

## 2017-02-23 DIAGNOSIS — E662 Morbid (severe) obesity with alveolar hypoventilation: Secondary | ICD-10-CM | POA: Diagnosis not present

## 2017-02-27 DIAGNOSIS — E785 Hyperlipidemia, unspecified: Secondary | ICD-10-CM | POA: Diagnosis not present

## 2017-02-27 DIAGNOSIS — J9622 Acute and chronic respiratory failure with hypercapnia: Secondary | ICD-10-CM | POA: Diagnosis not present

## 2017-02-27 DIAGNOSIS — E662 Morbid (severe) obesity with alveolar hypoventilation: Secondary | ICD-10-CM | POA: Diagnosis not present

## 2017-02-27 DIAGNOSIS — E1165 Type 2 diabetes mellitus with hyperglycemia: Secondary | ICD-10-CM | POA: Diagnosis not present

## 2017-02-27 DIAGNOSIS — I5033 Acute on chronic diastolic (congestive) heart failure: Secondary | ICD-10-CM | POA: Diagnosis not present

## 2017-02-27 DIAGNOSIS — J441 Chronic obstructive pulmonary disease with (acute) exacerbation: Secondary | ICD-10-CM | POA: Diagnosis not present

## 2017-02-27 DIAGNOSIS — J9621 Acute and chronic respiratory failure with hypoxia: Secondary | ICD-10-CM | POA: Diagnosis not present

## 2017-02-27 DIAGNOSIS — M109 Gout, unspecified: Secondary | ICD-10-CM | POA: Diagnosis not present

## 2017-02-27 DIAGNOSIS — I110 Hypertensive heart disease with heart failure: Secondary | ICD-10-CM | POA: Diagnosis not present

## 2017-02-28 DIAGNOSIS — I10 Essential (primary) hypertension: Secondary | ICD-10-CM | POA: Diagnosis not present

## 2017-02-28 DIAGNOSIS — E119 Type 2 diabetes mellitus without complications: Secondary | ICD-10-CM | POA: Diagnosis not present

## 2017-03-01 ENCOUNTER — Ambulatory Visit: Payer: Self-pay | Admitting: *Deleted

## 2017-03-06 DIAGNOSIS — E1165 Type 2 diabetes mellitus with hyperglycemia: Secondary | ICD-10-CM | POA: Diagnosis not present

## 2017-03-06 DIAGNOSIS — I5033 Acute on chronic diastolic (congestive) heart failure: Secondary | ICD-10-CM | POA: Diagnosis not present

## 2017-03-06 DIAGNOSIS — I110 Hypertensive heart disease with heart failure: Secondary | ICD-10-CM | POA: Diagnosis not present

## 2017-03-06 DIAGNOSIS — E662 Morbid (severe) obesity with alveolar hypoventilation: Secondary | ICD-10-CM | POA: Diagnosis not present

## 2017-03-06 DIAGNOSIS — M109 Gout, unspecified: Secondary | ICD-10-CM | POA: Diagnosis not present

## 2017-03-06 DIAGNOSIS — E785 Hyperlipidemia, unspecified: Secondary | ICD-10-CM | POA: Diagnosis not present

## 2017-03-06 DIAGNOSIS — J9622 Acute and chronic respiratory failure with hypercapnia: Secondary | ICD-10-CM | POA: Diagnosis not present

## 2017-03-06 DIAGNOSIS — J441 Chronic obstructive pulmonary disease with (acute) exacerbation: Secondary | ICD-10-CM | POA: Diagnosis not present

## 2017-03-06 DIAGNOSIS — J9621 Acute and chronic respiratory failure with hypoxia: Secondary | ICD-10-CM | POA: Diagnosis not present

## 2017-03-09 ENCOUNTER — Other Ambulatory Visit: Payer: Self-pay | Admitting: *Deleted

## 2017-03-09 DIAGNOSIS — E119 Type 2 diabetes mellitus without complications: Secondary | ICD-10-CM | POA: Diagnosis not present

## 2017-03-09 DIAGNOSIS — I119 Hypertensive heart disease without heart failure: Secondary | ICD-10-CM | POA: Diagnosis not present

## 2017-03-09 DIAGNOSIS — J45909 Unspecified asthma, uncomplicated: Secondary | ICD-10-CM | POA: Diagnosis not present

## 2017-03-09 DIAGNOSIS — I429 Cardiomyopathy, unspecified: Secondary | ICD-10-CM | POA: Diagnosis not present

## 2017-03-09 DIAGNOSIS — G4700 Insomnia, unspecified: Secondary | ICD-10-CM | POA: Diagnosis not present

## 2017-03-09 DIAGNOSIS — I10 Essential (primary) hypertension: Secondary | ICD-10-CM | POA: Diagnosis not present

## 2017-03-09 DIAGNOSIS — G4733 Obstructive sleep apnea (adult) (pediatric): Secondary | ICD-10-CM | POA: Diagnosis not present

## 2017-03-09 DIAGNOSIS — Z0000 Encounter for general adult medical examination without abnormal findings: Secondary | ICD-10-CM | POA: Diagnosis not present

## 2017-03-09 DIAGNOSIS — E785 Hyperlipidemia, unspecified: Secondary | ICD-10-CM | POA: Diagnosis not present

## 2017-03-12 DIAGNOSIS — J449 Chronic obstructive pulmonary disease, unspecified: Secondary | ICD-10-CM | POA: Diagnosis not present

## 2017-03-12 DIAGNOSIS — J45909 Unspecified asthma, uncomplicated: Secondary | ICD-10-CM | POA: Diagnosis not present

## 2017-03-15 DIAGNOSIS — E785 Hyperlipidemia, unspecified: Secondary | ICD-10-CM | POA: Diagnosis not present

## 2017-03-15 DIAGNOSIS — I5033 Acute on chronic diastolic (congestive) heart failure: Secondary | ICD-10-CM | POA: Diagnosis not present

## 2017-03-15 DIAGNOSIS — E1159 Type 2 diabetes mellitus with other circulatory complications: Secondary | ICD-10-CM | POA: Diagnosis not present

## 2017-03-15 DIAGNOSIS — E662 Morbid (severe) obesity with alveolar hypoventilation: Secondary | ICD-10-CM | POA: Diagnosis not present

## 2017-03-15 DIAGNOSIS — L84 Corns and callosities: Secondary | ICD-10-CM | POA: Diagnosis not present

## 2017-03-15 DIAGNOSIS — M79671 Pain in right foot: Secondary | ICD-10-CM | POA: Diagnosis not present

## 2017-03-15 DIAGNOSIS — I110 Hypertensive heart disease with heart failure: Secondary | ICD-10-CM | POA: Diagnosis not present

## 2017-03-15 DIAGNOSIS — M79672 Pain in left foot: Secondary | ICD-10-CM | POA: Diagnosis not present

## 2017-03-15 DIAGNOSIS — B351 Tinea unguium: Secondary | ICD-10-CM | POA: Diagnosis not present

## 2017-03-15 DIAGNOSIS — M109 Gout, unspecified: Secondary | ICD-10-CM | POA: Diagnosis not present

## 2017-03-15 DIAGNOSIS — J9621 Acute and chronic respiratory failure with hypoxia: Secondary | ICD-10-CM | POA: Diagnosis not present

## 2017-03-15 DIAGNOSIS — E1165 Type 2 diabetes mellitus with hyperglycemia: Secondary | ICD-10-CM | POA: Diagnosis not present

## 2017-03-15 DIAGNOSIS — J9622 Acute and chronic respiratory failure with hypercapnia: Secondary | ICD-10-CM | POA: Diagnosis not present

## 2017-03-15 DIAGNOSIS — J441 Chronic obstructive pulmonary disease with (acute) exacerbation: Secondary | ICD-10-CM | POA: Diagnosis not present

## 2017-03-18 DIAGNOSIS — J449 Chronic obstructive pulmonary disease, unspecified: Secondary | ICD-10-CM | POA: Diagnosis not present

## 2017-03-18 DIAGNOSIS — R269 Unspecified abnormalities of gait and mobility: Secondary | ICD-10-CM | POA: Diagnosis not present

## 2017-03-18 DIAGNOSIS — M356 Relapsing panniculitis [Weber-Christian]: Secondary | ICD-10-CM | POA: Diagnosis not present

## 2017-03-18 DIAGNOSIS — J45909 Unspecified asthma, uncomplicated: Secondary | ICD-10-CM | POA: Diagnosis not present

## 2017-03-21 ENCOUNTER — Other Ambulatory Visit: Payer: Self-pay | Admitting: Pharmacist

## 2017-03-21 ENCOUNTER — Ambulatory Visit: Payer: Self-pay | Admitting: Pharmacist

## 2017-03-21 NOTE — Patient Outreach (Signed)
[facility]) Care Management  03/21/2017  Morgan BeachJulia Fischer 07/16/1950 [id number]   Patient was called regarding medication assistance to follow up. Unfortunately, she did not answer the phone. HIPAA compliant message was left for the patient on her voicemail.  Plan: Call patient back within 1 business day per protocol.  Beecher McardleKatina J. [name], PharmD, BCACP [facility] Clinical Pharmacist [phone]

## 2017-03-22 ENCOUNTER — Ambulatory Visit: Payer: Self-pay | Admitting: Pharmacist

## 2017-03-22 ENCOUNTER — Other Ambulatory Visit: Payer: Self-pay | Admitting: Pharmacist

## 2017-03-22 DIAGNOSIS — J449 Chronic obstructive pulmonary disease, unspecified: Secondary | ICD-10-CM | POA: Diagnosis not present

## 2017-03-22 DIAGNOSIS — J45909 Unspecified asthma, uncomplicated: Secondary | ICD-10-CM | POA: Diagnosis not present

## 2017-03-22 NOTE — Patient Outreach (Addendum)
[facility]) Care Management  03/22/2017  Morgan Fischer 06-Jul-1950 [id number]   Patient was called regarding medication assistance as a follow up. Unfortunately, she did not answer the phone. Today was the second unsuccessful call.   Plan: Per protocol, patient will be sent an unsuccessful contact letter.  If she does not respond within 10 days, her pharmacy case will be closed.  Morgan Fischer, PharmD, BCACP [facility] Clinical Pharmacist [phone]  ADDENDUM  Patient called me back just as I was sending her a letter. HIPAA identifiers were obtained. Patient confirmed she has not spent close to the $600 required to obtain Advair or the $1000 required in out of pocket spending to obtain insulin form Patient Assistance Programming.  Patient's medications were reviewed while on the phone. (Reviewed medications from outside the system as well):  Medications Reviewed Today    Reviewed by Morgan Fischer, [facility] (Pharmacist) on 03/22/17 at 1729  Med List Status: <None>  Medication Order Taking? Sig Documenting Provider Last Dose Status Informant  ACCU-CHEK AVIVA PLUS test strip [id number] Yes Inject 1 applicator into the skin as needed. [provider] Taking Active Self           Med Note [facility], CHASITIE R   Thu Sep 16, 2015 11:40 AM)    allopurinol (ZYLOPRIM) 300 MG tablet [id number] Yes Take 300 mg by mouth daily. [provider] Taking Active Self  amLODipine (NORVASC) 5 MG tablet [id number] Yes Take 1 tablet by mouth daily. Hedwig Morton., MD Taking Active   aspirin 325 MG EC tablet [id number] Yes Take 325 mg by mouth daily. [provider] Taking Active Self  atenolol (TENORMIN) 100 MG tablet [id number] Yes Take 1 tablet (100 mg total) by mouth daily. Morgan Sea, MD Taking Active   Calcium Carb-Cholecalciferol (CALCIUM-VITAMIN D3) 500-400 MG-UNIT TABS [id number] Yes Take 1 tablet by mouth 2 (two) times daily. [provider] Taking  Active Self  Cholecalciferol (VITAMIN D3) 2000 units capsule [id number] Yes Take 1 capsule by mouth daily. Bonsu, Osei A, DO Taking Active   Cyanocobalamin (VITAMIN B-12 PO) [id number] Yes Take 1 tablet by mouth daily. [provider] Taking Active   Dulaglutide (TRULICITY) 0.75 MG/0.5ML SOPN [id number] No Inject 1 application into the skin once a week.  [provider] Not Taking Active Self           Med Note Primus Bravo Mar 22, 2017  5:23 PM) Not taking due to cost  Fluticasone-Salmeterol (ADVAIR) 250-50 MCG/DOSE AEPB [id number]  Inhale 1 puff into the lungs 2 (two) times daily. [provider]  Active   furosemide (LASIX) 40 MG tablet [id number] Yes Take 1 tablet (40 mg total) by mouth 2 (two) times daily. Morgan Sea, MD Taking Active   Garlic 1000 MG CAPS [id number] Yes Take 1,000 mg by mouth daily. [provider] Taking Active            Med Note Leavy Fischer, Morgan Joy   Fri Jul 28, 2016 10:30 AM)    glimepiride (AMARYL) 4 MG tablet [id number] Yes Take 4 mg by mouth daily.  [provider] Taking Active Self           Med Note Leavy Fischer, Morgan Joy   Fri Jan 28, 2016  3:47 PM)    hydrALAZINE (APRESOLINE) 25 MG tablet [id number]  Take 25 mg by mouth every 8 (eight) hours. [provider]  Expired 08/24/16 2359   hydrOXYzine (ATARAX/VISTARIL) 10  MG tablet [id number] Yes Take 10 mg by mouth 3 (three) times daily as needed. [provider] Taking Active Self  ibuprofen (ADVIL,MOTRIN) 600 MG tablet [id number]  Take 1 tablet (600 mg total) by mouth every 6 (six) hours as needed. Narda BondsNettey, Ralph A, MD  Active Self           Med Note Fabio Neighbors(KENAN, Morgan Fischer   Fri Mar 17, 2016 11:00 AM)    ipratropium-albuterol (DUONEB) 0.5-2.5 (3) MG/3ML SOLN [id number] Yes Use 1 vial in nebulizer every 4 hours as needed Morgan Fischer, Wayne, MD Taking Active   LANTUS SOLOSTAR 100 UNIT/ML Solostar Pen [id number] Yes Inject 15 Units into the skin daily.  Bonsu, Osei A, DO  Taking Active Self           Med Note Leavy Fischer(Morgan Fischer, Morgan JoyKATINA J   Fri Jan 28, 2016  3:50 PM)    losartan (COZAAR) 100 MG tablet [id number] Yes Take 1 tablet by mouth daily. Bonsu, Osei A, DO Taking Active   montelukast (SINGULAIR) 10 MG tablet [id number] Yes Take 10 mg by mouth at bedtime. [provider] Taking Active Self  Multiple Vitamin (MULTIVITAMIN WITH MINERALS) TABS tablet [id number] Yes Take 1 tablet by mouth daily. [provider] Taking Active   nitroGLYCERIN (NITROSTAT) 0.4 MG SL tablet [id number] Yes Place 1 tablet (0.4 mg total) under the tongue every 5 (five) minutes as needed for chest pain. Morgan SeaSingh, Morgan K, MD Taking Active   Omega-3 Fatty Acids (FISH OIL) 1000 MG CAPS [id number] Yes Take 1,000 mg by mouth 2 (two) times daily.  [provider] Taking Active   potassium chloride (K-DUR,KLOR-CON) 10 MEQ tablet [id number] Yes Take 20 mEq by mouth once. [provider] Taking Active   rosuvastatin (CRESTOR) 40 MG tablet [id number] Yes Take 40 mg by mouth daily. [provider] Taking Active   Vitamins/Minerals TABS [id number] Yes Take 1 tablet by mouth daily. [provider] Taking Active            Plan: Patient's case will be closed as she did not spend enough in 2018 either. Patient was [facility] and give us a call if she gets close to spending the required amount.  Patient's case will be closed for now. [facility], Gean MaidensFrances Fischer is still working with the patient and will be notified of pharmacy closure.  Morgan McardleKatina J. Avrielle Fry, PharmD, BCACP [facility] Clinical Pharmacist [phone]

## 2017-03-23 DIAGNOSIS — G4733 Obstructive sleep apnea (adult) (pediatric): Secondary | ICD-10-CM | POA: Diagnosis not present

## 2017-03-23 DIAGNOSIS — E785 Hyperlipidemia, unspecified: Secondary | ICD-10-CM | POA: Diagnosis not present

## 2017-03-23 DIAGNOSIS — I10 Essential (primary) hypertension: Secondary | ICD-10-CM | POA: Diagnosis not present

## 2017-03-23 DIAGNOSIS — I429 Cardiomyopathy, unspecified: Secondary | ICD-10-CM | POA: Diagnosis not present

## 2017-03-23 DIAGNOSIS — Z113 Encounter for screening for infections with a predominantly sexual mode of transmission: Secondary | ICD-10-CM | POA: Diagnosis not present

## 2017-03-23 DIAGNOSIS — I119 Hypertensive heart disease without heart failure: Secondary | ICD-10-CM | POA: Diagnosis not present

## 2017-03-23 DIAGNOSIS — Z01118 Encounter for examination of ears and hearing with other abnormal findings: Secondary | ICD-10-CM | POA: Diagnosis not present

## 2017-03-23 DIAGNOSIS — Z0000 Encounter for general adult medical examination without abnormal findings: Secondary | ICD-10-CM | POA: Diagnosis not present

## 2017-03-23 DIAGNOSIS — J45909 Unspecified asthma, uncomplicated: Secondary | ICD-10-CM | POA: Diagnosis not present

## 2017-03-23 DIAGNOSIS — Z131 Encounter for screening for diabetes mellitus: Secondary | ICD-10-CM | POA: Diagnosis not present

## 2017-03-23 DIAGNOSIS — Z136 Encounter for screening for cardiovascular disorders: Secondary | ICD-10-CM | POA: Diagnosis not present

## 2017-03-23 DIAGNOSIS — E119 Type 2 diabetes mellitus without complications: Secondary | ICD-10-CM | POA: Diagnosis not present

## 2017-03-28 DIAGNOSIS — G4739 Other sleep apnea: Secondary | ICD-10-CM | POA: Diagnosis not present

## 2017-03-28 DIAGNOSIS — J45998 Other asthma: Secondary | ICD-10-CM | POA: Diagnosis not present

## 2017-03-28 DIAGNOSIS — R0602 Shortness of breath: Secondary | ICD-10-CM | POA: Diagnosis not present

## 2017-03-28 DIAGNOSIS — I2781 Cor pulmonale (chronic): Secondary | ICD-10-CM | POA: Diagnosis not present

## 2017-04-05 ENCOUNTER — Ambulatory Visit: Payer: Self-pay | Admitting: Pharmacist

## 2017-04-05 DIAGNOSIS — H524 Presbyopia: Secondary | ICD-10-CM | POA: Diagnosis not present

## 2017-04-11 DIAGNOSIS — I10 Essential (primary) hypertension: Secondary | ICD-10-CM | POA: Diagnosis not present

## 2017-04-12 DIAGNOSIS — J45909 Unspecified asthma, uncomplicated: Secondary | ICD-10-CM | POA: Diagnosis not present

## 2017-04-12 DIAGNOSIS — J449 Chronic obstructive pulmonary disease, unspecified: Secondary | ICD-10-CM | POA: Diagnosis not present

## 2017-04-18 DIAGNOSIS — J45909 Unspecified asthma, uncomplicated: Secondary | ICD-10-CM | POA: Diagnosis not present

## 2017-04-18 DIAGNOSIS — R269 Unspecified abnormalities of gait and mobility: Secondary | ICD-10-CM | POA: Diagnosis not present

## 2017-04-18 DIAGNOSIS — M356 Relapsing panniculitis [Weber-Christian]: Secondary | ICD-10-CM | POA: Diagnosis not present

## 2017-04-18 DIAGNOSIS — J449 Chronic obstructive pulmonary disease, unspecified: Secondary | ICD-10-CM | POA: Diagnosis not present

## 2017-04-22 DIAGNOSIS — J45909 Unspecified asthma, uncomplicated: Secondary | ICD-10-CM | POA: Diagnosis not present

## 2017-04-22 DIAGNOSIS — J449 Chronic obstructive pulmonary disease, unspecified: Secondary | ICD-10-CM | POA: Diagnosis not present

## 2017-05-02 DIAGNOSIS — E119 Type 2 diabetes mellitus without complications: Secondary | ICD-10-CM | POA: Diagnosis not present

## 2017-05-02 DIAGNOSIS — I10 Essential (primary) hypertension: Secondary | ICD-10-CM | POA: Diagnosis not present

## 2017-05-04 IMAGING — DX DG CHEST 2V
2 series · 2 of 2 positions shown · non-contrast
Comparison: 10/06/2007

CLINICAL DATA: Chronic shortness of breath, hypoxia

EXAM:
CHEST  2 VIEW

[chest pa]
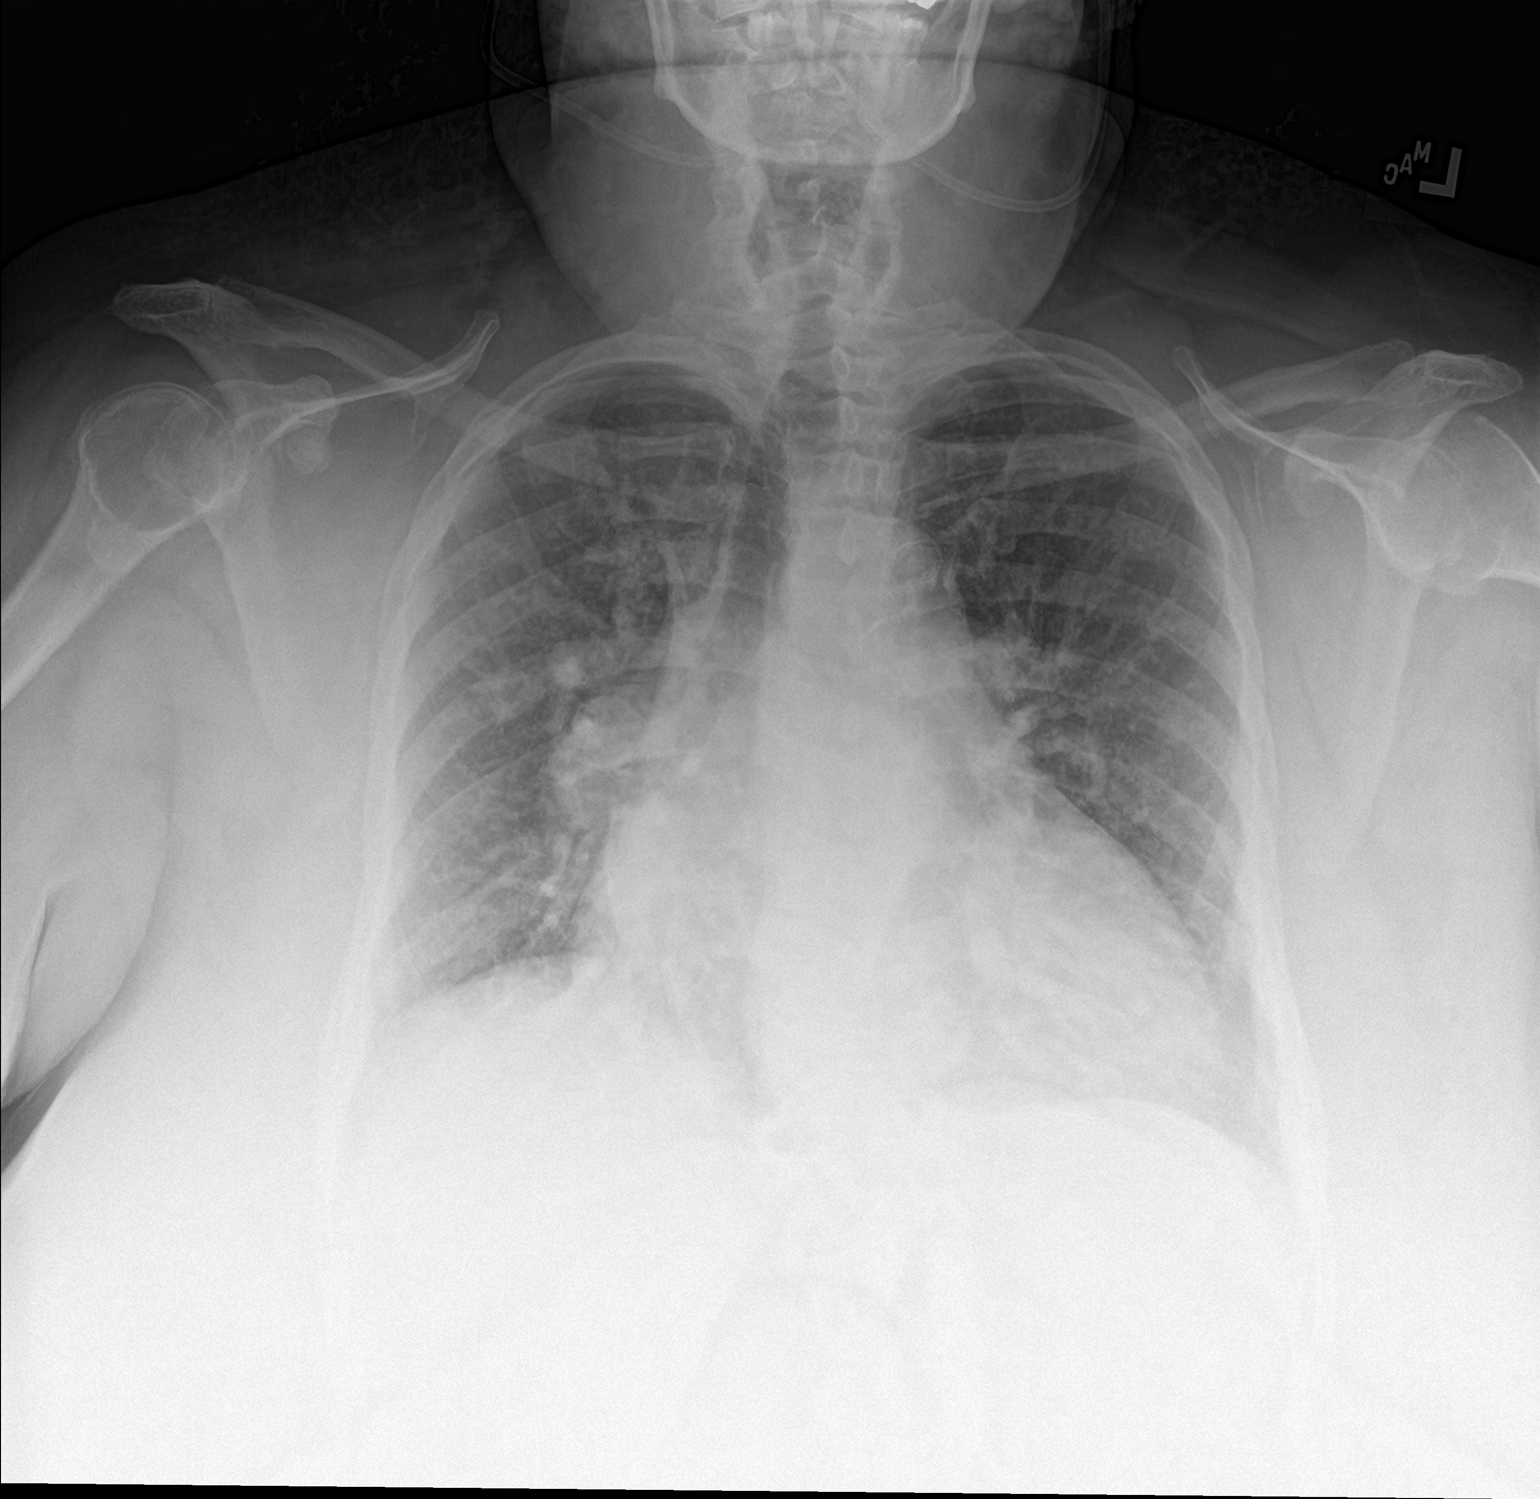

[chest lat]
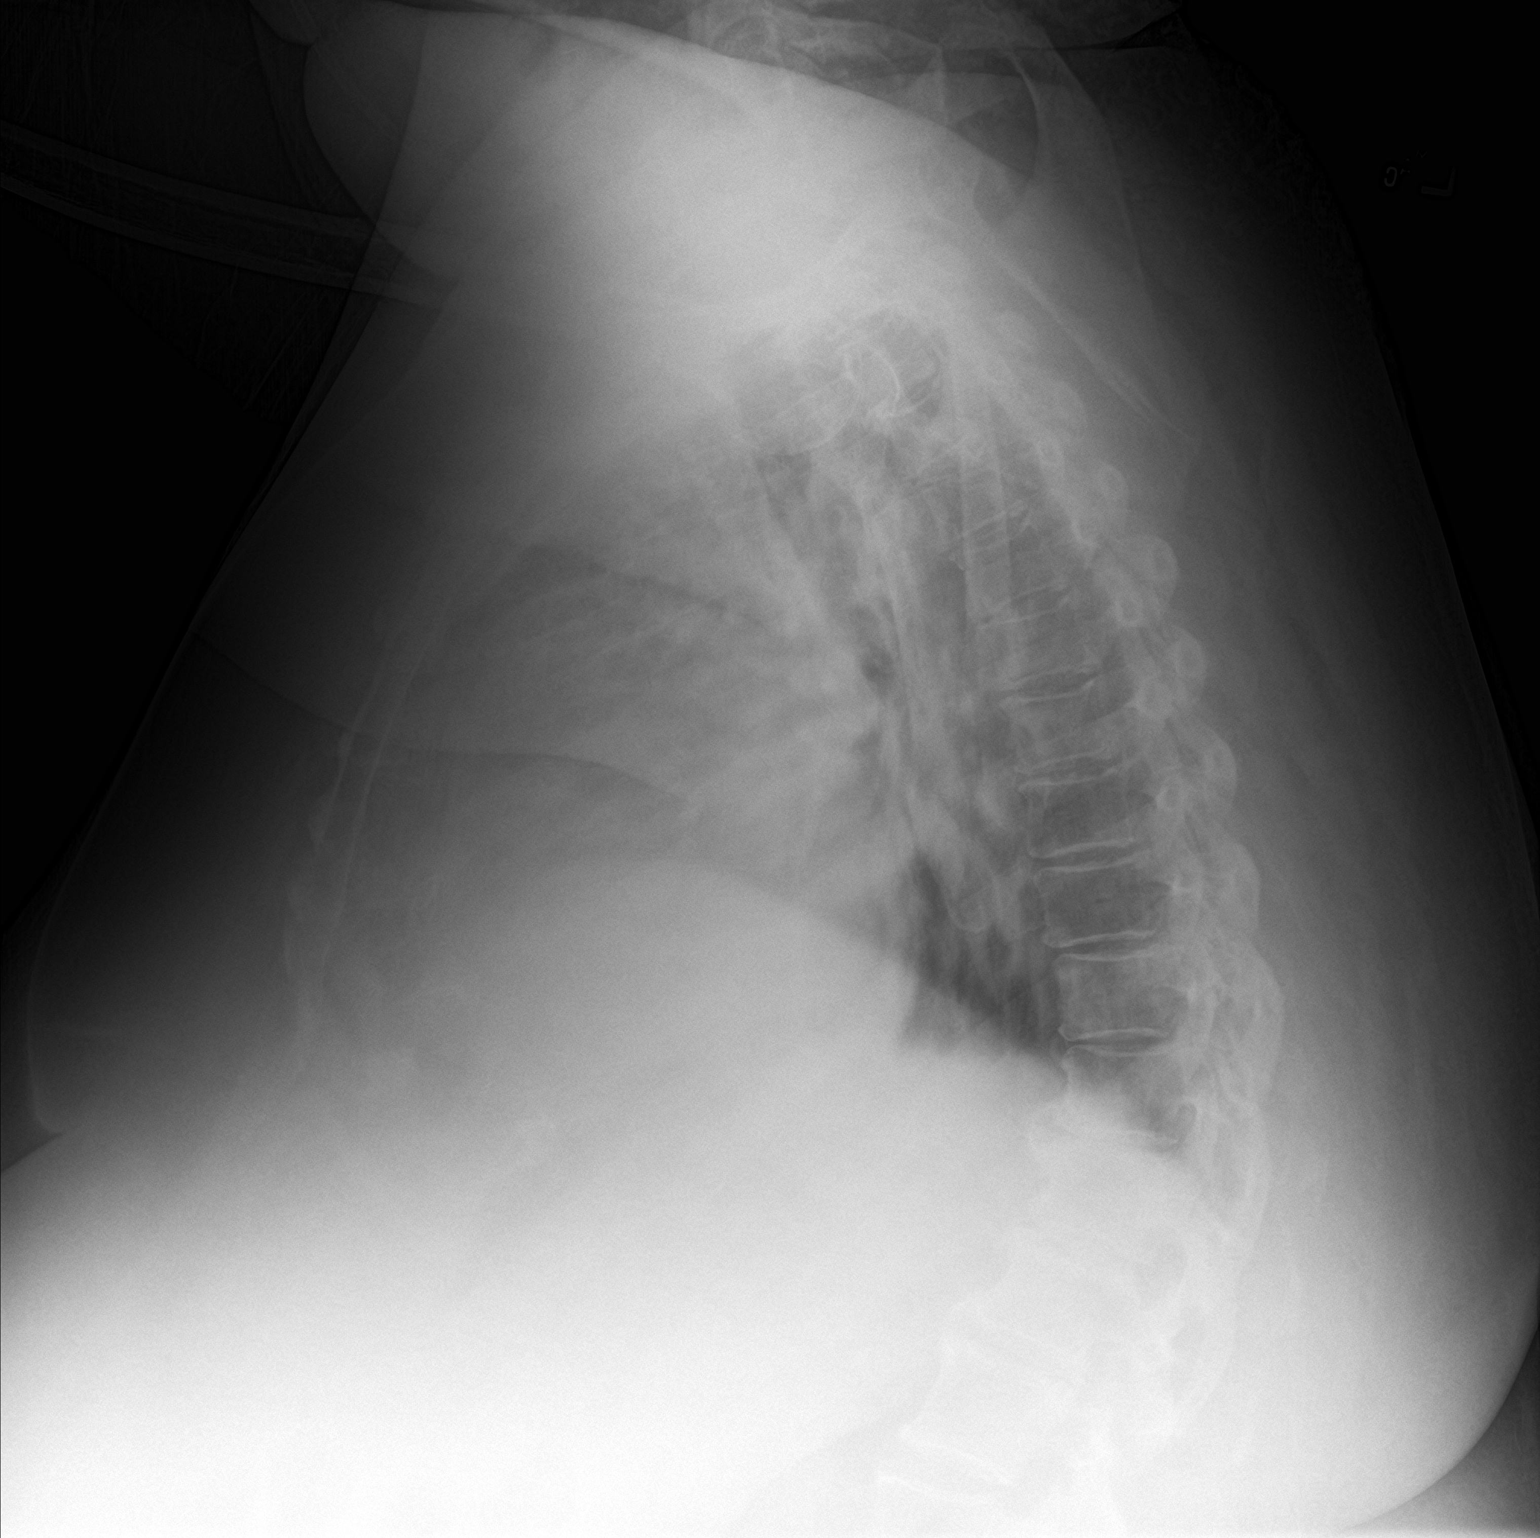

[2 of 2 positions shown; findings below may reference images not displayed]

FINDINGS: Cardiomegaly with pulmonary vascular congestion. No frank
interstitial edema. Mild right basilar scarring/ atelectasis. No
pleural effusion or pneumothorax.

Mild degenerative changes of the visualized thoracolumbar spine.
IMPRESSION: Cardiomegaly with pulmonary vascular congestion. No frank
interstitial edema.

Mild right basilar scarring/ atelectasis, chronic.

## 2017-05-08 DIAGNOSIS — G4700 Insomnia, unspecified: Secondary | ICD-10-CM | POA: Diagnosis not present

## 2017-05-08 DIAGNOSIS — I10 Essential (primary) hypertension: Secondary | ICD-10-CM | POA: Diagnosis not present

## 2017-05-08 DIAGNOSIS — E785 Hyperlipidemia, unspecified: Secondary | ICD-10-CM | POA: Diagnosis not present

## 2017-05-08 DIAGNOSIS — I119 Hypertensive heart disease without heart failure: Secondary | ICD-10-CM | POA: Diagnosis not present

## 2017-05-08 DIAGNOSIS — E119 Type 2 diabetes mellitus without complications: Secondary | ICD-10-CM | POA: Diagnosis not present

## 2017-05-08 DIAGNOSIS — G4733 Obstructive sleep apnea (adult) (pediatric): Secondary | ICD-10-CM | POA: Diagnosis not present

## 2017-05-08 DIAGNOSIS — J45909 Unspecified asthma, uncomplicated: Secondary | ICD-10-CM | POA: Diagnosis not present

## 2017-05-08 DIAGNOSIS — I429 Cardiomyopathy, unspecified: Secondary | ICD-10-CM | POA: Diagnosis not present

## 2017-05-12 DIAGNOSIS — J449 Chronic obstructive pulmonary disease, unspecified: Secondary | ICD-10-CM | POA: Diagnosis not present

## 2017-05-12 DIAGNOSIS — J45909 Unspecified asthma, uncomplicated: Secondary | ICD-10-CM | POA: Diagnosis not present

## 2017-05-16 ENCOUNTER — Other Ambulatory Visit: Payer: Self-pay | Admitting: *Deleted

## 2017-05-16 ENCOUNTER — Encounter: Payer: Self-pay | Admitting: *Deleted

## 2017-05-16 NOTE — Patient Outreach (Signed)
[facility]) Care Management  05/16/2017  Morgan Fischer 05-14-1950 [id number]  Morgan Fischer is a 67 year old female with medication history which includes diabetes mellitus, chronic diastolic heart failure, COPD, morbid obesity, and secondary hypertension. Morgan Fischer has been followed in the past by [facility] Care Management for nursing and pharmacy services. I reached out today to Morgan Fischer by phone to follow up on her disease management needs.   Morgan Fischer reports that "overall" she is doing well. She confirms that she completed a course of home health physical therapy a few months ago and feels this was beneficial to her. She affirms that she is still a primary care patient of Dr. Julio Sicks and is seeing him as scheduled.   Diabetes Disease Management - Morgan Fischer continues to monitor her cbg at home but could not give me specific cbg readings today. Her last known HgA1C was 7.5 but she believes she has a more recent reading and is going to try to find it. She denies having had symptoms of hypoglcyemia and says she has been off insulin for some time after it was discontinued because of episodes of hypoglycemia.  Morgan Fischer has a very good understanding of the signs and symptoms of hypoglycemia and reports that she takes "something sweet, usually juice" when she experiences hypoglycemia. She says she does not have symptoms related to hyperglycemia. Morgan Fischer last worked with a dietician in 2017 and most recently saw her podiatrist for diabetic foot exam and care on 03/15/17.   Dr. Lysle Dingwall ([facility]) 03/15/17 Maximiano Coss RD ([facility]) 10/04/15  Hypertension Disease Management - Morgan Fischer reports that "as far as she knows), her blood pressure has been stable over the last few months. She could not recall a specific reading but says whomever last took it at the doctor's office said it was "Ok". Morgan Fischer would  like to have her own BP monitor so I will order one for her which will be delivered to her at home. Morgan Fischer is seeing her cardiologist as scheduled and was last seen by Dr. Dot Been on 02/13/17. Morgan Fischer verbalizes knowledge of the education that has been provided to her regarding lifestyle around hypertension management.   Dr. Noel Gerold [facility]) 02/13/17  Medication Management - In March, Morgan Fischer was engaged by our pharmacy team per her request for Advair Medication Assistance. As per documentation provided by Dr. Nunzio Cobbs, Morgan Fischer understands that she must meet a $600 out of pocked deductible to be able to obtain Advair and a $1000 required out of pocket spending to obtain insulin form Patient Assistance Programming. Ms. Fischer verbalizes understanding of this information.   Plan: I will call Morgan Fischer again by the end of the week to make arrangements for delivery of her blood pressure monitor.   Morgan Fischer will reach out to her provider or to me with new or worsened symptoms or questions re: her healthcare needs.   [facility] CM Care Plan Problem One     Most Recent Value  Care Plan Problem One  Knowledge Deficits related to Self Health Monitoring and Management   Role Documenting the Problem One  Health Coach  Care Plan for Problem One  Active  [facility] Long Term Goal   Over the next 45 days, patient will demonstrate activities that evidence understanding of importance of self health monitoring  [facility] Long Term Goal  Start Date  05/16/17  Interventions for Problem One Long Term Goal  reviewed current plan of care, knowledge of disease management self health activities  [facility] CM Short Term Goal #1   Over the next 30 days, patient will verbalize evidence of blood pressure checks at home  [facility] CM Short Term Goal #1 Start Date  05/16/17  Interventions for Short Term Goal #1  arrangements made for BP monitor delivery,  reviewed with patient  importance of ongoing bp checks  [facility] CM Short Term Goal #2   Over the next 30 days patient will verablize evidence of routine cbg checks  [facility] CM Short Term Goal #2 Start Date  05/16/17  Interventions for Short Term Goal #2  reviewed importance of long term self monitoring of cbg's  [facility] CM Short Term Goal #3  Over the next 30 days, patient will verbalize undertanding of signs and symptoms associated with hypertension and diabetes which require reporting to provider  [facility] CM Short Term Goal #3 Start Date  05/16/17  Interventions for Short Tern Goal #3  Emmi educational materials prescribed,  reviewed signs and symtpoms related to DM and HTN for which the patient should report      Marja Kays [facility] Care Management  [phone]

## 2017-05-16 NOTE — Telephone Encounter (Signed)
This encounter was created in error - please disregard.

## 2017-05-18 ENCOUNTER — Other Ambulatory Visit: Payer: Self-pay | Admitting: *Deleted

## 2017-05-18 DIAGNOSIS — M79672 Pain in left foot: Secondary | ICD-10-CM | POA: Diagnosis not present

## 2017-05-18 DIAGNOSIS — B351 Tinea unguium: Secondary | ICD-10-CM | POA: Diagnosis not present

## 2017-05-18 DIAGNOSIS — M79671 Pain in right foot: Secondary | ICD-10-CM | POA: Diagnosis not present

## 2017-05-18 DIAGNOSIS — E1159 Type 2 diabetes mellitus with other circulatory complications: Secondary | ICD-10-CM | POA: Diagnosis not present

## 2017-05-18 DIAGNOSIS — L84 Corns and callosities: Secondary | ICD-10-CM | POA: Diagnosis not present

## 2017-05-18 NOTE — Patient Outreach (Signed)
[facility]) Care Management  05/18/2017  Morgan Fischer [birth year] [id number]  I reached out to [name] today to notify her that a blood pressure monitor will be mailed to her on Monday 05/21/17. I asked her to call me when she receives her monitor if she has any difficulty understanding how to use it.   Plan: I will follow up with [name] by phone next week.    Marja Kays MHA,BSN,RN,CCM [facility] Care Management  [phone]

## 2017-05-22 DIAGNOSIS — J45909 Unspecified asthma, uncomplicated: Secondary | ICD-10-CM | POA: Diagnosis not present

## 2017-05-22 DIAGNOSIS — J449 Chronic obstructive pulmonary disease, unspecified: Secondary | ICD-10-CM | POA: Diagnosis not present

## 2017-05-23 DIAGNOSIS — N6320 Unspecified lump in the left breast, unspecified quadrant: Secondary | ICD-10-CM | POA: Diagnosis not present

## 2017-05-23 DIAGNOSIS — N649 Disorder of breast, unspecified: Secondary | ICD-10-CM | POA: Diagnosis not present

## 2017-05-23 DIAGNOSIS — Z09 Encounter for follow-up examination after completed treatment for conditions other than malignant neoplasm: Secondary | ICD-10-CM | POA: Diagnosis not present

## 2017-05-23 DIAGNOSIS — R928 Other abnormal and inconclusive findings on diagnostic imaging of breast: Secondary | ICD-10-CM | POA: Diagnosis not present

## 2017-06-06 DIAGNOSIS — I10 Essential (primary) hypertension: Secondary | ICD-10-CM | POA: Diagnosis not present

## 2017-06-06 DIAGNOSIS — G4733 Obstructive sleep apnea (adult) (pediatric): Secondary | ICD-10-CM | POA: Diagnosis not present

## 2017-06-08 DIAGNOSIS — R269 Unspecified abnormalities of gait and mobility: Secondary | ICD-10-CM | POA: Diagnosis not present

## 2017-06-08 DIAGNOSIS — J45909 Unspecified asthma, uncomplicated: Secondary | ICD-10-CM | POA: Diagnosis not present

## 2017-06-08 DIAGNOSIS — M356 Relapsing panniculitis [Weber-Christian]: Secondary | ICD-10-CM | POA: Diagnosis not present

## 2017-06-08 DIAGNOSIS — J449 Chronic obstructive pulmonary disease, unspecified: Secondary | ICD-10-CM | POA: Diagnosis not present

## 2017-06-12 DIAGNOSIS — I429 Cardiomyopathy, unspecified: Secondary | ICD-10-CM | POA: Diagnosis not present

## 2017-06-12 DIAGNOSIS — I10 Essential (primary) hypertension: Secondary | ICD-10-CM | POA: Diagnosis not present

## 2017-06-12 DIAGNOSIS — J449 Chronic obstructive pulmonary disease, unspecified: Secondary | ICD-10-CM | POA: Diagnosis not present

## 2017-06-12 DIAGNOSIS — J45909 Unspecified asthma, uncomplicated: Secondary | ICD-10-CM | POA: Diagnosis not present

## 2017-06-12 DIAGNOSIS — E119 Type 2 diabetes mellitus without complications: Secondary | ICD-10-CM | POA: Diagnosis not present

## 2017-06-12 DIAGNOSIS — I119 Hypertensive heart disease without heart failure: Secondary | ICD-10-CM | POA: Diagnosis not present

## 2017-06-12 DIAGNOSIS — E785 Hyperlipidemia, unspecified: Secondary | ICD-10-CM | POA: Diagnosis not present

## 2017-06-12 DIAGNOSIS — M1710 Unilateral primary osteoarthritis, unspecified knee: Secondary | ICD-10-CM | POA: Diagnosis not present

## 2017-06-12 DIAGNOSIS — G4700 Insomnia, unspecified: Secondary | ICD-10-CM | POA: Diagnosis not present

## 2017-06-22 DIAGNOSIS — J449 Chronic obstructive pulmonary disease, unspecified: Secondary | ICD-10-CM | POA: Diagnosis not present

## 2017-06-22 DIAGNOSIS — J45909 Unspecified asthma, uncomplicated: Secondary | ICD-10-CM | POA: Diagnosis not present

## 2017-06-27 ENCOUNTER — Other Ambulatory Visit: Payer: Self-pay | Admitting: *Deleted

## 2017-06-27 NOTE — Patient Outreach (Signed)
[facility]) Care Management  06/27/2017  Morgan BeachJulia Fischer 03/02/1950 [id number]  Morgan Fischer is a 67 year old female with medication history which includes diabetes mellitus, chronic diastolic heart failure, COPD, morbid obesity, and secondary hypertension. Morgan Fischer [facility] for nursing and pharmacy services. I reached out today to Morgan Fischer by phone to follow up on her disease management needs.   Morgan Fischer reports today that she feels she is doing well and "on track" overall. She denies any signs or symptoms of hypotension or hypertension, hypoglcyemia or hyperglycemia. She received her blood pressure monitor and has been using it since to monitor and document her findings since. She reports that she has seen all of her providers who are pleased with her progress. Morgan Fischer is checking her cbg's as recommended and reports she is taking all prescribed medications. Upcoming provider appointments include: [facility] 10/04/15  Plan: Mss. Fischer says she feels she is doing well and is on track. She declined to be followed further by our health coaching team but graciously confirmed my contact information and our toll free number should she find herself in need of case management services in the future.   Morgan Fischer agreed to case closure at this time. I have notified her provider.    Morgan Fischer MHA,BSN,RN,CCM [facility] Care Management  [phone]

## 2017-06-28 DIAGNOSIS — G4739 Other sleep apnea: Secondary | ICD-10-CM | POA: Diagnosis not present

## 2017-06-28 DIAGNOSIS — R0602 Shortness of breath: Secondary | ICD-10-CM | POA: Diagnosis not present

## 2017-06-28 DIAGNOSIS — I2781 Cor pulmonale (chronic): Secondary | ICD-10-CM | POA: Diagnosis not present

## 2017-06-28 DIAGNOSIS — J45998 Other asthma: Secondary | ICD-10-CM | POA: Diagnosis not present

## 2017-07-12 DIAGNOSIS — J45909 Unspecified asthma, uncomplicated: Secondary | ICD-10-CM | POA: Diagnosis not present

## 2017-07-12 DIAGNOSIS — J449 Chronic obstructive pulmonary disease, unspecified: Secondary | ICD-10-CM | POA: Diagnosis not present

## 2017-07-20 DIAGNOSIS — B351 Tinea unguium: Secondary | ICD-10-CM | POA: Diagnosis not present

## 2017-07-20 DIAGNOSIS — E1159 Type 2 diabetes mellitus with other circulatory complications: Secondary | ICD-10-CM | POA: Diagnosis not present

## 2017-07-20 DIAGNOSIS — M79671 Pain in right foot: Secondary | ICD-10-CM | POA: Diagnosis not present

## 2017-07-20 DIAGNOSIS — M79672 Pain in left foot: Secondary | ICD-10-CM | POA: Diagnosis not present

## 2017-07-20 DIAGNOSIS — L84 Corns and callosities: Secondary | ICD-10-CM | POA: Diagnosis not present

## 2017-07-22 DIAGNOSIS — J45909 Unspecified asthma, uncomplicated: Secondary | ICD-10-CM | POA: Diagnosis not present

## 2017-07-22 DIAGNOSIS — J449 Chronic obstructive pulmonary disease, unspecified: Secondary | ICD-10-CM | POA: Diagnosis not present

## 2017-07-27 DIAGNOSIS — G4700 Insomnia, unspecified: Secondary | ICD-10-CM | POA: Diagnosis not present

## 2017-07-27 DIAGNOSIS — E785 Hyperlipidemia, unspecified: Secondary | ICD-10-CM | POA: Diagnosis not present

## 2017-07-27 DIAGNOSIS — I119 Hypertensive heart disease without heart failure: Secondary | ICD-10-CM | POA: Diagnosis not present

## 2017-07-27 DIAGNOSIS — I429 Cardiomyopathy, unspecified: Secondary | ICD-10-CM | POA: Diagnosis not present

## 2017-07-27 DIAGNOSIS — J45909 Unspecified asthma, uncomplicated: Secondary | ICD-10-CM | POA: Diagnosis not present

## 2017-07-27 DIAGNOSIS — E119 Type 2 diabetes mellitus without complications: Secondary | ICD-10-CM | POA: Diagnosis not present

## 2017-07-27 DIAGNOSIS — I10 Essential (primary) hypertension: Secondary | ICD-10-CM | POA: Diagnosis not present

## 2017-07-27 DIAGNOSIS — G4733 Obstructive sleep apnea (adult) (pediatric): Secondary | ICD-10-CM | POA: Diagnosis not present

## 2017-08-12 DIAGNOSIS — J45909 Unspecified asthma, uncomplicated: Secondary | ICD-10-CM | POA: Diagnosis not present

## 2017-08-12 DIAGNOSIS — J449 Chronic obstructive pulmonary disease, unspecified: Secondary | ICD-10-CM | POA: Diagnosis not present

## 2017-08-14 DIAGNOSIS — J449 Chronic obstructive pulmonary disease, unspecified: Secondary | ICD-10-CM | POA: Diagnosis not present

## 2017-08-14 DIAGNOSIS — G4700 Insomnia, unspecified: Secondary | ICD-10-CM | POA: Diagnosis not present

## 2017-08-14 DIAGNOSIS — I5032 Chronic diastolic (congestive) heart failure: Secondary | ICD-10-CM | POA: Diagnosis not present

## 2017-08-14 DIAGNOSIS — Z6841 Body Mass Index (BMI) 40.0 and over, adult: Secondary | ICD-10-CM | POA: Diagnosis not present

## 2017-08-22 DIAGNOSIS — J449 Chronic obstructive pulmonary disease, unspecified: Secondary | ICD-10-CM | POA: Diagnosis not present

## 2017-08-22 DIAGNOSIS — J45909 Unspecified asthma, uncomplicated: Secondary | ICD-10-CM | POA: Diagnosis not present

## 2017-08-23 DIAGNOSIS — I119 Hypertensive heart disease without heart failure: Secondary | ICD-10-CM | POA: Diagnosis not present

## 2017-08-23 DIAGNOSIS — I429 Cardiomyopathy, unspecified: Secondary | ICD-10-CM | POA: Diagnosis not present

## 2017-08-23 DIAGNOSIS — G4733 Obstructive sleep apnea (adult) (pediatric): Secondary | ICD-10-CM | POA: Diagnosis not present

## 2017-08-23 DIAGNOSIS — E785 Hyperlipidemia, unspecified: Secondary | ICD-10-CM | POA: Diagnosis not present

## 2017-08-23 DIAGNOSIS — E119 Type 2 diabetes mellitus without complications: Secondary | ICD-10-CM | POA: Diagnosis not present

## 2017-08-23 DIAGNOSIS — G4700 Insomnia, unspecified: Secondary | ICD-10-CM | POA: Diagnosis not present

## 2017-08-23 DIAGNOSIS — I10 Essential (primary) hypertension: Secondary | ICD-10-CM | POA: Diagnosis not present

## 2017-08-23 DIAGNOSIS — J45909 Unspecified asthma, uncomplicated: Secondary | ICD-10-CM | POA: Diagnosis not present

## 2017-08-31 DIAGNOSIS — G4733 Obstructive sleep apnea (adult) (pediatric): Secondary | ICD-10-CM | POA: Diagnosis not present

## 2017-08-31 DIAGNOSIS — I10 Essential (primary) hypertension: Secondary | ICD-10-CM | POA: Diagnosis not present

## 2017-08-31 DIAGNOSIS — I429 Cardiomyopathy, unspecified: Secondary | ICD-10-CM | POA: Diagnosis not present

## 2017-08-31 DIAGNOSIS — J45909 Unspecified asthma, uncomplicated: Secondary | ICD-10-CM | POA: Diagnosis not present

## 2017-08-31 DIAGNOSIS — E611 Iron deficiency: Secondary | ICD-10-CM | POA: Diagnosis not present

## 2017-08-31 DIAGNOSIS — I119 Hypertensive heart disease without heart failure: Secondary | ICD-10-CM | POA: Diagnosis not present

## 2017-08-31 DIAGNOSIS — E785 Hyperlipidemia, unspecified: Secondary | ICD-10-CM | POA: Diagnosis not present

## 2017-08-31 DIAGNOSIS — G4700 Insomnia, unspecified: Secondary | ICD-10-CM | POA: Diagnosis not present

## 2017-09-12 DIAGNOSIS — J45909 Unspecified asthma, uncomplicated: Secondary | ICD-10-CM | POA: Diagnosis not present

## 2017-09-12 DIAGNOSIS — J449 Chronic obstructive pulmonary disease, unspecified: Secondary | ICD-10-CM | POA: Diagnosis not present

## 2017-09-18 DIAGNOSIS — I10 Essential (primary) hypertension: Secondary | ICD-10-CM | POA: Diagnosis not present

## 2017-09-18 DIAGNOSIS — I2510 Atherosclerotic heart disease of native coronary artery without angina pectoris: Secondary | ICD-10-CM | POA: Diagnosis not present

## 2017-09-18 DIAGNOSIS — E7800 Pure hypercholesterolemia, unspecified: Secondary | ICD-10-CM | POA: Diagnosis not present

## 2017-09-18 DIAGNOSIS — I421 Obstructive hypertrophic cardiomyopathy: Secondary | ICD-10-CM

## 2017-09-18 DIAGNOSIS — E119 Type 2 diabetes mellitus without complications: Secondary | ICD-10-CM | POA: Diagnosis not present

## 2017-09-18 DIAGNOSIS — I350 Nonrheumatic aortic (valve) stenosis: Secondary | ICD-10-CM | POA: Diagnosis not present

## 2017-09-18 HISTORY — DX: Obstructive hypertrophic cardiomyopathy: I42.1

## 2017-09-22 DIAGNOSIS — R269 Unspecified abnormalities of gait and mobility: Secondary | ICD-10-CM | POA: Diagnosis not present

## 2017-09-22 DIAGNOSIS — J449 Chronic obstructive pulmonary disease, unspecified: Secondary | ICD-10-CM | POA: Diagnosis not present

## 2017-09-22 DIAGNOSIS — M356 Relapsing panniculitis [Weber-Christian]: Secondary | ICD-10-CM | POA: Diagnosis not present

## 2017-09-22 DIAGNOSIS — J45909 Unspecified asthma, uncomplicated: Secondary | ICD-10-CM | POA: Diagnosis not present

## 2017-09-26 DIAGNOSIS — M79672 Pain in left foot: Secondary | ICD-10-CM | POA: Diagnosis not present

## 2017-09-26 DIAGNOSIS — M79671 Pain in right foot: Secondary | ICD-10-CM | POA: Diagnosis not present

## 2017-09-26 DIAGNOSIS — B351 Tinea unguium: Secondary | ICD-10-CM | POA: Diagnosis not present

## 2017-09-26 DIAGNOSIS — E1159 Type 2 diabetes mellitus with other circulatory complications: Secondary | ICD-10-CM | POA: Diagnosis not present

## 2017-09-26 DIAGNOSIS — L84 Corns and callosities: Secondary | ICD-10-CM | POA: Diagnosis not present

## 2017-10-12 ENCOUNTER — Ambulatory Visit (HOSPITAL_BASED_OUTPATIENT_CLINIC_OR_DEPARTMENT_OTHER)
Admission: RE | Admit: 2017-10-12 | Discharge: 2017-10-12 | Disposition: A | Discharge status: Home / Self Care | Payer: Medicare HMO | Source: Ambulatory Visit | Attending: Internal Medicine | Admitting: Internal Medicine

## 2017-10-12 ENCOUNTER — Other Ambulatory Visit (HOSPITAL_BASED_OUTPATIENT_CLINIC_OR_DEPARTMENT_OTHER): Payer: Self-pay | Admitting: Internal Medicine

## 2017-10-12 DIAGNOSIS — R609 Edema, unspecified: Secondary | ICD-10-CM

## 2017-10-12 DIAGNOSIS — E611 Iron deficiency: Secondary | ICD-10-CM | POA: Diagnosis not present

## 2017-10-12 DIAGNOSIS — J45909 Unspecified asthma, uncomplicated: Secondary | ICD-10-CM | POA: Diagnosis not present

## 2017-10-12 DIAGNOSIS — M79605 Pain in left leg: Secondary | ICD-10-CM | POA: Insufficient documentation

## 2017-10-12 DIAGNOSIS — M79604 Pain in right leg: Secondary | ICD-10-CM | POA: Diagnosis not present

## 2017-10-12 DIAGNOSIS — E119 Type 2 diabetes mellitus without complications: Secondary | ICD-10-CM | POA: Diagnosis not present

## 2017-10-12 DIAGNOSIS — I429 Cardiomyopathy, unspecified: Secondary | ICD-10-CM | POA: Diagnosis not present

## 2017-10-12 DIAGNOSIS — E785 Hyperlipidemia, unspecified: Secondary | ICD-10-CM | POA: Diagnosis not present

## 2017-10-12 DIAGNOSIS — J449 Chronic obstructive pulmonary disease, unspecified: Secondary | ICD-10-CM | POA: Diagnosis not present

## 2017-10-12 DIAGNOSIS — G4733 Obstructive sleep apnea (adult) (pediatric): Secondary | ICD-10-CM | POA: Diagnosis not present

## 2017-10-12 DIAGNOSIS — M7989 Other specified soft tissue disorders: Secondary | ICD-10-CM | POA: Diagnosis not present

## 2017-10-12 DIAGNOSIS — I119 Hypertensive heart disease without heart failure: Secondary | ICD-10-CM | POA: Diagnosis not present

## 2017-10-12 DIAGNOSIS — I10 Essential (primary) hypertension: Secondary | ICD-10-CM | POA: Diagnosis not present

## 2017-10-19 DIAGNOSIS — E611 Iron deficiency: Secondary | ICD-10-CM | POA: Diagnosis not present

## 2017-10-19 DIAGNOSIS — I119 Hypertensive heart disease without heart failure: Secondary | ICD-10-CM | POA: Diagnosis not present

## 2017-10-19 DIAGNOSIS — Z23 Encounter for immunization: Secondary | ICD-10-CM | POA: Diagnosis not present

## 2017-10-19 DIAGNOSIS — J45909 Unspecified asthma, uncomplicated: Secondary | ICD-10-CM | POA: Diagnosis not present

## 2017-10-19 DIAGNOSIS — G4733 Obstructive sleep apnea (adult) (pediatric): Secondary | ICD-10-CM | POA: Diagnosis not present

## 2017-10-19 DIAGNOSIS — E785 Hyperlipidemia, unspecified: Secondary | ICD-10-CM | POA: Diagnosis not present

## 2017-10-19 DIAGNOSIS — I429 Cardiomyopathy, unspecified: Secondary | ICD-10-CM | POA: Diagnosis not present

## 2017-10-19 DIAGNOSIS — E119 Type 2 diabetes mellitus without complications: Secondary | ICD-10-CM | POA: Diagnosis not present

## 2017-10-19 DIAGNOSIS — I872 Venous insufficiency (chronic) (peripheral): Secondary | ICD-10-CM | POA: Diagnosis not present

## 2017-10-19 DIAGNOSIS — I10 Essential (primary) hypertension: Secondary | ICD-10-CM | POA: Diagnosis not present

## 2017-10-22 DIAGNOSIS — J45909 Unspecified asthma, uncomplicated: Secondary | ICD-10-CM | POA: Diagnosis not present

## 2017-10-22 DIAGNOSIS — J449 Chronic obstructive pulmonary disease, unspecified: Secondary | ICD-10-CM | POA: Diagnosis not present

## 2017-11-06 DIAGNOSIS — Z6841 Body Mass Index (BMI) 40.0 and over, adult: Secondary | ICD-10-CM | POA: Diagnosis not present

## 2017-11-06 DIAGNOSIS — G4733 Obstructive sleep apnea (adult) (pediatric): Secondary | ICD-10-CM | POA: Diagnosis not present

## 2017-11-12 DIAGNOSIS — J45909 Unspecified asthma, uncomplicated: Secondary | ICD-10-CM | POA: Diagnosis not present

## 2017-11-12 DIAGNOSIS — J449 Chronic obstructive pulmonary disease, unspecified: Secondary | ICD-10-CM | POA: Diagnosis not present

## 2017-11-14 DIAGNOSIS — R0602 Shortness of breath: Secondary | ICD-10-CM | POA: Diagnosis not present

## 2017-11-14 DIAGNOSIS — G4739 Other sleep apnea: Secondary | ICD-10-CM | POA: Diagnosis not present

## 2017-11-14 DIAGNOSIS — J45998 Other asthma: Secondary | ICD-10-CM | POA: Diagnosis not present

## 2017-11-14 DIAGNOSIS — Z6841 Body Mass Index (BMI) 40.0 and over, adult: Secondary | ICD-10-CM | POA: Diagnosis not present

## 2017-11-16 DIAGNOSIS — M1712 Unilateral primary osteoarthritis, left knee: Secondary | ICD-10-CM | POA: Diagnosis not present

## 2017-11-16 DIAGNOSIS — M1711 Unilateral primary osteoarthritis, right knee: Secondary | ICD-10-CM | POA: Diagnosis not present

## 2017-11-16 DIAGNOSIS — M170 Bilateral primary osteoarthritis of knee: Secondary | ICD-10-CM | POA: Diagnosis not present

## 2017-11-22 DIAGNOSIS — J45909 Unspecified asthma, uncomplicated: Secondary | ICD-10-CM | POA: Diagnosis not present

## 2017-11-22 DIAGNOSIS — J449 Chronic obstructive pulmonary disease, unspecified: Secondary | ICD-10-CM | POA: Diagnosis not present

## 2017-12-11 ENCOUNTER — Ambulatory Visit: Payer: Medicare HMO | Admitting: Physical Therapy

## 2017-12-12 DIAGNOSIS — J449 Chronic obstructive pulmonary disease, unspecified: Secondary | ICD-10-CM | POA: Diagnosis not present

## 2017-12-12 DIAGNOSIS — J45909 Unspecified asthma, uncomplicated: Secondary | ICD-10-CM | POA: Diagnosis not present

## 2017-12-18 ENCOUNTER — Other Ambulatory Visit: Payer: Self-pay

## 2017-12-18 ENCOUNTER — Ambulatory Visit: Payer: Medicare HMO | Attending: Internal Medicine | Admitting: Physical Therapy

## 2017-12-18 ENCOUNTER — Encounter: Payer: Self-pay | Admitting: Physical Therapy

## 2017-12-18 VITALS — BP 143/62 | HR 68

## 2017-12-18 DIAGNOSIS — M25561 Pain in right knee: Secondary | ICD-10-CM | POA: Diagnosis not present

## 2017-12-18 DIAGNOSIS — R262 Difficulty in walking, not elsewhere classified: Secondary | ICD-10-CM

## 2017-12-18 DIAGNOSIS — M6281 Muscle weakness (generalized): Secondary | ICD-10-CM | POA: Insufficient documentation

## 2017-12-18 DIAGNOSIS — G8929 Other chronic pain: Secondary | ICD-10-CM | POA: Insufficient documentation

## 2017-12-18 DIAGNOSIS — M25662 Stiffness of left knee, not elsewhere classified: Secondary | ICD-10-CM | POA: Insufficient documentation

## 2017-12-18 DIAGNOSIS — M25661 Stiffness of right knee, not elsewhere classified: Secondary | ICD-10-CM | POA: Insufficient documentation

## 2017-12-18 DIAGNOSIS — M25562 Pain in left knee: Secondary | ICD-10-CM | POA: Diagnosis not present

## 2017-12-18 NOTE — Therapy (Signed)
[facility] Outpatient Rehabilitation [facility] [address]  [address] [place], [place], [address] Phone: [phone]   Fax:  [phone]  Physical Therapy Evaluation  Patient Details  Name: Morgan Fischer MRN: [id number] Date of Birth: 26-Jun-1950 Referring Provider (PT): Jackie Plum, MD   Encounter Date: 12/18/2017  PT End of Session - 12/18/17 1405    Visit Number  1    Number of Visits  7    Date for PT Re-Evaluation  01/29/18    Authorization Type  [employer]    PT Start Time  1012    PT Stop Time  1102    PT Time Calculation (min)  50 min    Activity Tolerance  Patient tolerated treatment well;Patient limited by pain    Behavior During Therapy  [facility] for tasks assessed/performed       Past Medical History:  Diagnosis Date  . Arthritis   . Asthma   . CHF (congestive heart failure) (HCC)   . COPD (chronic obstructive pulmonary disease) (HCC)   . Diabetes mellitus without complication (HCC)   . High cholesterol   . Hypertension   . Morbid obesity (HCC)     Past Surgical History:  Procedure Laterality Date  . ABDOMINAL HYSTERECTOMY    . CESAREAN SECTION      Vitals:   12/18/17 1014 12/18/17 1024  BP: (!) 170/70 (!) 143/62  Pulse: 68   SpO2: 96%      Subjective Assessment - 12/18/17 1025    Subjective  Patient present with caregiver. Reports she has had B knee pain for about 8 years. Started on pain meds, then has cortisone injections, however MDs informed her that she is not a candidate for surgery. Since then, has had intermittent knee pain for 8 years and has not had any treatment since then. Reports that she has pain daily, but not all day. Worse with activity- walking, prolonged standing, standing after prolonged sitting. Easing factors include Aleve and topical prescription gel. Pain is localized to B lateral knees and directly over patella. Patient is on 2L supplemental O2, walks on 3L.    Patient is accompained by:  --    caregiver- Ladona Ridgel   Pertinent History  morbid obesity, HTN, HLD, DM, COPD, CHF, asthma, arthritis    Limitations  Lifting;Standing;Walking;House hold activities    How long can you stand comfortably?  3 min    How long can you walk comfortably?  2-3 min    Diagnostic tests  per patient- had recent xray on B knees which showed "bone on bone"    Patient Stated Goals  be able to walk a little further, and be able to stand a little longer to do the dishes    Currently in Pain?  No/denies    Pain Location  Knee    Pain Orientation  Right;Left    Pain Descriptors / Indicators  Aching    Pain Type  Chronic pain         [facility] PT Assessment - 12/18/17 1034      Assessment   Medical Diagnosis  Knee Pain    Referring Provider (PT)  Jackie Plum, MD    Onset Date/Surgical Date  --   8 years   Next MD Visit  --   Not scheduled   Prior Therapy  Yes- HH PT      Precautions   Precautions  --   on 2-3L O2     Restrictions   Weight  Bearing Restrictions  No      Balance Screen   Has the patient fallen in the past 6 months  No    Has the patient had a decrease in activity level because of a fear of falling?   No    Is the patient reluctant to leave their home because of a fear of falling?   No      Home Public house managernvironment   Living Environment  Private residence    Living Arrangements  Children   daughter   Available Help at Discharge  Personal care attendant   4x/week from 9am-12pm   Type of Home  House    Home Access  Ramped entrance    Entrance Stairs-Rails  Right    Home Layout  One level    Home Equipment  Wheelchair - manual;Cane - single point;Walker - 4 wheels      Prior Function   Level of Independence  Needs assistance with ADLs   needs assistance with bathing and cooking   Vocation  Retired    Leisure  would like to access more places and go out more      Cognition   Overall Cognitive Status  Within Functional Limits for tasks assessed      Observation/Other  Assessments   Observations  on 2L of supplemental O2    Focus on Therapeutic Outcomes (FOTO)   Knee: 32 (68% limited, 51% predicted)      Sensation   Light Touch  Appears Intact   intermittent tingling in B hands and feet     Coordination   Gross Motor Movements are Fluid and Coordinated  Yes      Posture/Postural Control   Posture/Postural Control  Postural limitations    Postural Limitations  Rounded Shoulders;Forward head      ROM / Strength   AROM / PROM / Strength  AROM;Strength      AROM   AROM Assessment Site  Knee   measured in sitting   Right/Left Knee  Right;Left    Right Knee Extension  0    Right Knee Flexion  85   mild posterior calf pain   Left Knee Extension  0    Left Knee Flexion  77   mild posterior calf pain     Strength   Strength Assessment Site  Hip;Knee;Ankle    Right/Left Hip  Right;Left    Right Hip Flexion  3+/5    Right Hip ABduction  4/5    Right Hip ADduction  4/5    Left Hip Flexion  3+/5    Left Hip ABduction  4/5    Left Hip ADduction  4/5    Right/Left Knee  Right;Left    Right Knee Flexion  4/5   pain in HS   Right Knee Extension  4/5    Left Knee Flexion  4/5    Left Knee Extension  4/5    Right/Left Ankle  Right;Left    Right Ankle Dorsiflexion  4+/5    Right Ankle Plantar Flexion  4+/5    Left Ankle Dorsiflexion  4+/5    Left Ankle Plantar Flexion  4+/5      Palpation   Palpation comment  no TTP at B knee, mild TTP in L posterior calf      Ambulation/Gait   Assistive device  Straight cane    Gait Pattern  Step-to pattern;Step-through pattern;Decreased step length - right;Decreased step length - left;Trunk flexed;Lateral trunk lean to right;Lateral trunk lean  to left    Ambulation Surface  Level;Indoor    Gait velocity  decreased      Standardized Balance Assessment   Standardized Balance Assessment  Timed Up and Go Test      Timed Up and Go Test   Normal TUG (seconds)  18.4    TUG Comments  from chair without UE  support; with SPC   PT holding O2 tank               Objective measurements completed on examination: See above findings.              PT Education - 12/18/17 1310    Education Details  prognosis, POC, HEP    Person(s) Educated  Patient;Caregiver(s)    Methods  Explanation;Demonstration;Tactile cues;Verbal cues;Handout    Comprehension  Verbalized understanding;Returned demonstration       PT Short Term Goals - 12/18/17 1419      PT SHORT TERM GOAL #1   Title  Patient to be independent with initial HEP.    Time  3    Period  Weeks    Status  New    Target Date  01/08/18        PT Long Term Goals - 12/18/17 1420      PT LONG TERM GOAL #1   Title  Patient to be independent with advanced HEP.    Time  6    Period  Weeks    Status  New    Target Date  01/29/18      PT LONG TERM GOAL #2   Title  Patient to demonstrate >=4+/5 strength in B LEs.    Time  6    Period  Weeks    Status  New    Target Date  01/29/18      PT LONG TERM GOAL #3   Title  Patient to demonstrate 0-100 degrees AROM in B knees in order to encourage proper gait pattern.    Time  6    Period  Weeks    Status  New    Target Date  01/29/18      PT LONG TERM GOAL #4   Title  Patient to score <14 sec on TUG testing with SPC.    Time  6    Period  Weeks    Status  New    Target Date  01/29/18      PT LONG TERM GOAL #5   Title  Patient to report tolerance of of walking without knee pain limiting.     Time  6    Period  Weeks    Status  New    Target Date  01/29/18             Plan - 12/18/17 1406    Clinical Impression Statement  Patient is a 67y/o F with PMH significant for CHF and COPD on 2L O2 presenting to OPPT with c/o chronic B knee pain. Pain is located at B lateral knee and directly over patella. Worse with walking, prolonged standing, standing after prolonged sitting. Easing factors include Aleve and topical prescription gel. Patient walks with [facility] and  requires assistance from caregiver with bathing and cooking at baseline d/t comorbidities. Patient today with limited and painful B knee ROM, decreased LE strength, gait deviations, and limited functional activity tolerance. Scored 18.4 sec on TUG testing, indicating increased risk of falls. Patient and caregiver educated on gentle stretching and strengthening HEP to  be preform at counter top. Both reported understanding. Would benefit from skilled PT services 1x/week for 6 weeks to address aforementioned impairments.     Clinical Presentation  Stable    Clinical Decision Making  Low    Rehab Potential  Good    Clinical Impairments Affecting Rehab Potential  morbid obesity, HTN, HLD, DM, COPD, CHF, asthma, arthritis    PT Frequency  1x / week    PT Duration  6 weeks    PT Treatment/Interventions  ADLs/Self Care Home Management;Cryotherapy;Electrical Stimulation;Moist Heat;Ultrasound;DME Instruction;Gait training;Stair training;Functional mobility training;Patient/family education;Neuromuscular re-education;Balance training;Therapeutic exercise;Therapeutic activities;Manual techniques;Passive range of motion;Vasopneumatic Device;Taping;Splinting;Energy conservation;Dry needling    PT Next Visit Plan  reassess HEP    Consulted and Agree with Plan of Care  Patient       Patient will benefit from skilled therapeutic intervention in order to improve the following deficits and impairments:  Decreased range of motion, Difficulty walking, Decreased endurance, Decreased activity tolerance, Pain, Impaired flexibility, Improper body mechanics, Decreased balance, Decreased strength, Decreased mobility, Postural dysfunction  Visit Diagnosis: Chronic pain of left knee  Chronic pain of right knee  Stiffness of left knee, not elsewhere classified  Stiffness of right knee, not elsewhere classified  Difficulty in walking, not elsewhere classified  Muscle weakness (generalized)     Problem List Patient  Active Problem List   Diagnosis Date Noted  . Acute hypercapnic respiratory failure (HCC) 07/21/2016  . Drug allergy 07/21/2016  . Morbid obesity with BMI of 60.0-69.9, adult (HCC) 07/21/2016  . Acute on chronic congestive heart failure (HCC) 07/19/2016  . Acute respiratory acidosis 07/19/2016  . Acute respiratory distress 07/19/2016  . Chronic obstructive pulmonary disease with acute exacerbation (HCC) 07/19/2016  . Hyperglycemia due to type 2 diabetes mellitus (HCC) 07/19/2016  . Leukocytosis 07/19/2016  . Chronic diastolic heart failure (HCC) 03/17/2016  . Hypertensive heart disease with heart failure (HCC) 03/17/2016  . Hypoxia 03/17/2016  . Aortic atherosclerosis (HCC) 03/17/2016  . Nonrheumatic aortic valve stenosis 03/17/2016  . Elevated troponin 08/24/2015  . SOB (shortness of breath) 08/24/2015  . Hypokalemia 08/24/2015  . Morbid obesity due to excess calories (HCC)   . Secondary hypertension   . Diabetes mellitus without complication (HCC)   . Asthma   . Hypomagnesemia   . Panniculitis 08/23/2015    Anette Guarneri, PT, DPT 12/18/17 2:24 PM   [facility] Outpatient Rehabilitation [facility] [address]  [address] [place], [place], [address] Phone: [phone]   Fax:  [phone]  Name: Morgan Fischer MRN: [id number] Date of Birth: 1950/03/28

## 2017-12-22 DIAGNOSIS — J45909 Unspecified asthma, uncomplicated: Secondary | ICD-10-CM | POA: Diagnosis not present

## 2017-12-22 DIAGNOSIS — J449 Chronic obstructive pulmonary disease, unspecified: Secondary | ICD-10-CM | POA: Diagnosis not present

## 2018-01-04 ENCOUNTER — Ambulatory Visit: Payer: Medicare HMO | Admitting: Physical Therapy

## 2018-01-11 ENCOUNTER — Encounter: Payer: Self-pay | Admitting: Physical Therapy

## 2018-01-11 ENCOUNTER — Ambulatory Visit: Payer: Medicare HMO | Attending: Internal Medicine | Admitting: Physical Therapy

## 2018-01-11 VITALS — BP 164/68 | HR 61

## 2018-01-11 DIAGNOSIS — M25562 Pain in left knee: Secondary | ICD-10-CM | POA: Insufficient documentation

## 2018-01-11 DIAGNOSIS — M25661 Stiffness of right knee, not elsewhere classified: Secondary | ICD-10-CM | POA: Insufficient documentation

## 2018-01-11 DIAGNOSIS — R262 Difficulty in walking, not elsewhere classified: Secondary | ICD-10-CM | POA: Insufficient documentation

## 2018-01-11 DIAGNOSIS — M25561 Pain in right knee: Secondary | ICD-10-CM | POA: Diagnosis not present

## 2018-01-11 DIAGNOSIS — G8929 Other chronic pain: Secondary | ICD-10-CM | POA: Diagnosis not present

## 2018-01-11 DIAGNOSIS — M25662 Stiffness of left knee, not elsewhere classified: Secondary | ICD-10-CM | POA: Insufficient documentation

## 2018-01-11 DIAGNOSIS — M6281 Muscle weakness (generalized): Secondary | ICD-10-CM

## 2018-01-11 NOTE — Therapy (Signed)
[facility] [place] [address]  [address] [place], [place], [address] Phone: [phone]   Fax:  [phone]  Physical Therapy Treatment  Patient Details  Name: Morgan Fischer MRN: [id number] Date of Birth: 04/18/1950 Referring Provider (PT): Jackie PlumGeorge Osei-Bonsu, MD   Encounter Date: 01/11/2018  PT End of Session - 01/11/18 1013    Visit Number  2    Number of Visits  7    Date for PT Re-Evaluation  01/29/18    Authorization Type  [employer]    PT Start Time  [phone]    PT Stop Time  1012    PT Time Calculation (min)  41 min    Activity Tolerance  Patient tolerated treatment well;Patient limited by fatigue    Behavior During Therapy  [facility] for tasks assessed/performed       Past Medical History:  Diagnosis Date  . Arthritis   . Asthma   . CHF (congestive heart failure) (HCC)   . COPD (chronic obstructive pulmonary disease) (HCC)   . Diabetes mellitus without complication (HCC)   . High cholesterol   . Hypertension   . Morbid obesity (HCC)     Past Surgical History:  Procedure Laterality Date  . ABDOMINAL HYSTERECTOMY    . CESAREAN SECTION      Vitals:   01/11/18 0932 01/11/18 0939 01/11/18 0946 01/11/18 1011  BP: (!) 180/70 (!) 164/68    Pulse: 74  69 61  SpO2: 98%  94% 98%    Subjective Assessment - 01/11/18 0932    Subjective  Patient reports that she has been sick- having trouble breathing. Seems to be feeling better.    Patient is accompained by:  --   caregiver   Pertinent History  morbid obesity, HTN, HLD, DM, COPD, CHF, asthma, arthritis    Diagnostic tests  per patient- had recent xray on B knees which showed "bone on bone"    Patient Stated Goals  be able to walk a little further, and be able to stand a little longer to do the dishes    Currently in Pain?  Yes    Pain Score  3     Pain Location  Knee    Pain Orientation  Right    Pain Descriptors / Indicators  Sore    Pain Type  Chronic pain                        [facility] Adult PT Treatment/Exercise - 01/11/18 0001      Exercises   Exercises  Knee/Hip      Knee/Hip Exercises: Stretches   Gastroc Stretch  Right;Left;1 rep;30 seconds    Gastroc Stretch Limitations  at treadmill rail with towel roll      Knee/Hip Exercises: Standing   Knee Flexion  Strengthening;Right;Left;1 set;10 reps;Limitations    Knee Flexion Limitations  at treadmill rail    Hip Flexion  Stengthening;Both;2 sets;10 reps;Knee bent    Hip Flexion Limitations  at treadmill rail    Hip Abduction  Stengthening;Right;Left;1 set;10 reps;Knee straight    Abduction Limitations  at treadmill rail      Knee/Hip Exercises: Seated   Long Arc Quad  Strengthening;Left;Right;1 set;10 reps;Weights    Long Arc Quad Weight  1 lbs.    Long Arc Quad Limitations  1# on L LE, 0# on R LE    Hamstring Curl  Strengthening;Right;Left;1 set;10 reps;Limitations    Hamstring Limitations  red TB  Sit to Sand  1 set;5 reps;without UE support   sitting on 2 foam pads; cues for glute activation            PT Education - 01/11/18 1013    Education Details  update to HEP    Person(s) Educated  Patient;Caregiver(s)    Methods  Explanation;Demonstration;Tactile cues;Verbal cues;Handout    Comprehension  Verbalized understanding;Returned demonstration       PT Short Term Goals - 01/11/18 1014      PT SHORT TERM GOAL #1   Title  Patient to be independent with initial HEP.    Time  3    Period  Weeks    Status  On-going        PT Long Term Goals - 01/11/18 1014      PT LONG TERM GOAL #1   Title  Patient to be independent with advanced HEP.    Time  6    Period  Weeks    Status  On-going      PT LONG TERM GOAL #2   Title  Patient to demonstrate >=4+/5 strength in B LEs.    Time  6    Period  Weeks    Status  On-going      PT LONG TERM GOAL #3   Title  Patient to demonstrate 0-100 degrees AROM in B knees in order to encourage proper gait  pattern.    Time  6    Period  Weeks    Status  On-going      PT LONG TERM GOAL #4   Title  Patient to score <14 sec on TUG testing with SPC.    Time  6    Period  Weeks    Status  On-going      PT LONG TERM GOAL #5   Title  Patient to report tolerance of 10min of walking without knee pain limiting.     Time  6    Period  Weeks    Status  On-going            Plan - 01/11/18 1013    Clinical Impression Statement  Patient arrived to session with report that she had been sick and had trouble breathing, resulting in missing last appointment. Took vitals at beginning of session- systolic BP elevated. Patient admits to not taking BP meds this AM. Allowed patient a sitting rest break before taking vitals again- [facility]. Worked on STS without UE support with patient reporting hesitancy, however able to perform with cues for form. Worked on standing LE strengthening ther-ex with UE support and intermittent sitting rest breaks d/t fatigue and SOB. More difficulty with R LE than L LE. Vitals were monitored throughout and were WFL, on 2L O2. Updated HEP with modified gastroc stretch and standing hip abduction at counter. Patient and caregiver reported understanding. No complaints or SOIB at end of session.     Clinical Impairments Affecting Rehab Potential  morbid obesity, HTN, HLD, DM, COPD, CHF, asthma, arthritis    PT Treatment/Interventions  ADLs/Self Care Home Management;Cryotherapy;Electrical Stimulation;Moist Heat;Ultrasound;DME Instruction;Gait training;Stair training;Functional mobility training;Patient/family education;Neuromuscular re-education;Balance training;Therapeutic exercise;Therapeutic activities;Manual techniques;Passive range of motion;Vasopneumatic Device;Taping;Splinting;Energy conservation;Dry needling    PT Next Visit Plan  progress LE strengthening     Consulted and Agree with Plan of Care  Patient       Patient will benefit from skilled therapeutic intervention  in order to improve the following deficits and impairments:  Decreased range of  motion, Difficulty walking, Decreased endurance, Decreased activity tolerance, Pain, Impaired flexibility, Improper body mechanics, Decreased balance, Decreased strength, Decreased mobility, Postural dysfunction  Visit Diagnosis: Chronic pain of left knee  Chronic pain of right knee  Stiffness of left knee, not elsewhere classified  Stiffness of right knee, not elsewhere classified  Difficulty in walking, not elsewhere classified  Muscle weakness (generalized)     Problem List Patient Active Problem List   Diagnosis Date Noted  . Acute hypercapnic respiratory failure (HCC) 07/21/2016  . Drug allergy 07/21/2016  . Morbid obesity with BMI of 60.0-69.9, adult (HCC) 07/21/2016  . Acute on chronic congestive heart failure (HCC) 07/19/2016  . Acute respiratory acidosis 07/19/2016  . Acute respiratory distress 07/19/2016  . Chronic obstructive pulmonary disease with acute exacerbation (HCC) 07/19/2016  . Hyperglycemia due to type 2 diabetes mellitus (HCC) 07/19/2016  . Leukocytosis 07/19/2016  . Chronic diastolic heart failure (HCC) 03/17/2016  . Hypertensive heart disease with heart failure (HCC) 03/17/2016  . Hypoxia 03/17/2016  . Aortic atherosclerosis (HCC) 03/17/2016  . Nonrheumatic aortic valve stenosis 03/17/2016  . Elevated troponin 08/24/2015  . SOB (shortness of breath) 08/24/2015  . Hypokalemia 08/24/2015  . Morbid obesity due to excess calories (HCC)   . Secondary hypertension   . Diabetes mellitus without complication (HCC)   . Asthma   . Hypomagnesemia   . Panniculitis 08/23/2015     Anette Guarneri, PT, DPT 01/11/18 10:18 AM   [facility] [address]  [address] [place], [place], [address] Phone: [phone]   Fax:  [phone]  Name: Morgan Fischer MRN: [id number] Date of Birth: March 24, 1950

## 2018-01-12 DIAGNOSIS — J449 Chronic obstructive pulmonary disease, unspecified: Secondary | ICD-10-CM | POA: Diagnosis not present

## 2018-01-12 DIAGNOSIS — J45909 Unspecified asthma, uncomplicated: Secondary | ICD-10-CM | POA: Diagnosis not present

## 2018-01-18 ENCOUNTER — Encounter: Payer: Self-pay | Admitting: Physical Therapy

## 2018-01-18 ENCOUNTER — Ambulatory Visit: Payer: Medicare HMO | Admitting: Physical Therapy

## 2018-01-18 VITALS — BP 148/68 | HR 66

## 2018-01-18 DIAGNOSIS — M25662 Stiffness of left knee, not elsewhere classified: Secondary | ICD-10-CM

## 2018-01-18 DIAGNOSIS — M25561 Pain in right knee: Secondary | ICD-10-CM | POA: Diagnosis not present

## 2018-01-18 DIAGNOSIS — R262 Difficulty in walking, not elsewhere classified: Secondary | ICD-10-CM | POA: Diagnosis not present

## 2018-01-18 DIAGNOSIS — M25661 Stiffness of right knee, not elsewhere classified: Secondary | ICD-10-CM | POA: Diagnosis not present

## 2018-01-18 DIAGNOSIS — G8929 Other chronic pain: Secondary | ICD-10-CM | POA: Diagnosis not present

## 2018-01-18 DIAGNOSIS — M6281 Muscle weakness (generalized): Secondary | ICD-10-CM

## 2018-01-18 DIAGNOSIS — M25562 Pain in left knee: Secondary | ICD-10-CM | POA: Diagnosis not present

## 2018-01-18 NOTE — Therapy (Signed)
[facility] [place] [address]  [address] [place], [place], [address] Phone: [phone]   Fax:  [phone]  Physical Therapy Treatment  Patient Details  Name: Morgan BeachJulia Fischer MRN: [id number] Date of Birth: 03/24/1950 Referring Provider (PT): Jackie PlumGeorge Osei-Bonsu, MD   Encounter Date: 01/18/2018  PT End of Session - 01/18/18 1012    Visit Number  3    Number of Visits  7    Date for PT Re-Evaluation  01/29/18    Authorization Type  [employer]    PT Start Time  0932    PT Stop Time  1015    PT Time Calculation (min)  43 min    Activity Tolerance  Patient tolerated treatment well;Patient limited by fatigue;Patient limited by pain    Behavior During Therapy  [facility] for tasks assessed/performed       Past Medical History:  Diagnosis Date  . Arthritis   . Asthma   . CHF (congestive heart failure) (HCC)   . COPD (chronic obstructive pulmonary disease) (HCC)   . Diabetes mellitus without complication (HCC)   . High cholesterol   . Hypertension   . Morbid obesity (HCC)     Past Surgical History:  Procedure Laterality Date  . ABDOMINAL HYSTERECTOMY    . CESAREAN SECTION      Vitals:   01/18/18 0933 01/18/18 0948 01/18/18 0956 01/18/18 1011  BP: (!) 148/68     Pulse: 65 67 65 66  SpO2: 98% 97% 94% 95%    Subjective Assessment - 01/18/18 0933    Subjective  Reports that she is doing well, but still having some trouble with STS without UE support.     Patient is accompained by:  Family member   caregiver   Pertinent History  morbid obesity, HTN, HLD, DM, COPD, CHF, asthma, arthritis    Diagnostic tests  per patient- had recent xray on B knees which showed "bone on bone"    Patient Stated Goals  be able to walk a little further, and be able to stand a little longer to do the dishes    Currently in Pain?  Yes    Pain Score  3     Pain Location  Knee    Pain Orientation  Right;Posterior    Pain Descriptors / Indicators  Sore     Pain Type  Chronic pain                       [facility] Adult PT Treatment/Exercise - 01/18/18 0001      Knee/Hip Exercises: Aerobic   Nustep  L1 x 1 min UE/LEs   could not tolerate d/t pain in L knee     Knee/Hip Exercises: Standing   Hip Flexion  Stengthening;10 reps;Right;Left;1 set;Knee straight    Hip Flexion Limitations  at treadmill rail    Hip Abduction  Stengthening;Right;Left;1 set;10 reps;Knee straight    Abduction Limitations  at treadmill rail    Hip Extension  Stengthening;Right;Left;1 set;10 reps;Knee straight    Extension Limitations  at treadmill rail    Other Standing Knee Exercises  sidestepping with red TB around ankles 10x each side along treadmill rail    Other Standing Knee Exercises  alternate foot tap on step 3x20" with 1 UE support on treadmill rail      Knee/Hip Exercises: Seated   Sit to Sand  5 reps;without UE support;2 sets   sitting on foam pad in a chair  PT Short Term Goals - 01/11/18 1014      PT SHORT TERM GOAL #1   Title  Patient to be independent with initial HEP.    Time  3    Period  Weeks    Status  On-going        PT Long Term Goals - 01/11/18 1014      PT LONG TERM GOAL #1   Title  Patient to be independent with advanced HEP.    Time  6    Period  Weeks    Status  On-going      PT LONG TERM GOAL #2   Title  Patient to demonstrate >=4+/5 strength in B LEs.    Time  6    Period  Weeks    Status  On-going      PT LONG TERM GOAL #3   Title  Patient to demonstrate 0-100 degrees AROM in B knees in order to encourage proper gait pattern.    Time  6    Period  Weeks    Status  On-going      PT LONG TERM GOAL #4   Title  Patient to score <14 sec on TUG testing with SPC.    Time  6    Period  Weeks    Status  On-going      PT LONG TERM GOAL #5   Title  Patient to report tolerance of of walking without knee pain limiting.     Time  6    Period  Weeks    Status  On-going             Plan - 01/18/18 1013    Clinical Impression Statement  Patient arrived to session with no new complaints. Attempted Nustep for cardiovascular warm up, however patient reporting L knee pain and could not tolerate. Worked on STS without UE support with patient demonstrating good control standing part of transfer, more difficulty with eccentric lowering. Advised patient to build up her chair at home in order to perform STS without UE support as she continues to use UEs. Able to tolerate more reps than last session, however showing signs of muscle fatigue by the last couple reps. Patient demonstrating heavy handrail use with standing exercises d/t LE weakness. Patient quickly fatigued with alternating foot tap on step. Allowed for sitting rest breaks in between ther-ex. Vitals were maintained WFL throughout session. Patient reporting slight HA at end of session, was offered water and sitting rest break until she felt well enough to ambulate out of clinic.     Clinical Impairments Affecting Rehab Potential  morbid obesity, HTN, HLD, DM, COPD, CHF, asthma, arthritis    PT Treatment/Interventions  ADLs/Self Care Home Management;Cryotherapy;Electrical Stimulation;Moist Heat;Ultrasound;DME Instruction;Gait training;Stair training;Functional mobility training;Patient/family education;Neuromuscular re-education;Balance training;Therapeutic exercise;Therapeutic activities;Manual techniques;Passive range of motion;Vasopneumatic Device;Taping;Splinting;Energy conservation;Dry needling    PT Next Visit Plan  progress LE strengthening     Consulted and Agree with Plan of Care  Patient       Patient will benefit from skilled therapeutic intervention in order to improve the following deficits and impairments:  Decreased range of motion, Difficulty walking, Decreased endurance, Decreased activity tolerance, Pain, Impaired flexibility, Improper body mechanics, Decreased balance, Decreased strength, Decreased  mobility, Postural dysfunction  Visit Diagnosis: Chronic pain of left knee  Chronic pain of right knee  Stiffness of left knee, not elsewhere classified  Stiffness of right knee, not elsewhere classified  Difficulty in walking, not elsewhere classified  Muscle weakness (generalized)     Problem List Patient Active Problem List   Diagnosis Date Noted  . Acute hypercapnic respiratory failure (HCC) 07/21/2016  . Drug allergy 07/21/2016  . Morbid obesity with BMI of 60.0-69.9, adult (HCC) 07/21/2016  . Acute on chronic congestive heart failure (HCC) 07/19/2016  . Acute respiratory acidosis 07/19/2016  . Acute respiratory distress 07/19/2016  . Chronic obstructive pulmonary disease with acute exacerbation (HCC) 07/19/2016  . Hyperglycemia due to type 2 diabetes mellitus (HCC) 07/19/2016  . Leukocytosis 07/19/2016  . Chronic diastolic heart failure (HCC) 03/17/2016  . Hypertensive heart disease with heart failure (HCC) 03/17/2016  . Hypoxia 03/17/2016  . Aortic atherosclerosis (HCC) 03/17/2016  . Nonrheumatic aortic valve stenosis 03/17/2016  . Elevated troponin 08/24/2015  . SOB (shortness of breath) 08/24/2015  . Hypokalemia 08/24/2015  . Morbid obesity due to excess calories (HCC)   . Secondary hypertension   . Diabetes mellitus without complication (HCC)   . Asthma   . Hypomagnesemia   . Panniculitis 08/23/2015    Anette GuarneriYevgeniya Varnell Donate, PT, DPT 01/18/18 10:21 AM   [facility] [place] [address]  [address] [place], [place], [address] Phone: [phone]   Fax:  [phone]  Name: Morgan Fischer MRN: [id number] Date of Birth: 04/22/1950

## 2018-01-22 DIAGNOSIS — J449 Chronic obstructive pulmonary disease, unspecified: Secondary | ICD-10-CM | POA: Diagnosis not present

## 2018-01-22 DIAGNOSIS — J45909 Unspecified asthma, uncomplicated: Secondary | ICD-10-CM | POA: Diagnosis not present

## 2018-01-25 ENCOUNTER — Encounter: Payer: Self-pay | Admitting: Physical Therapy

## 2018-01-25 ENCOUNTER — Ambulatory Visit: Payer: Medicare HMO | Admitting: Physical Therapy

## 2018-01-25 VITALS — BP 122/62 | HR 63

## 2018-01-25 DIAGNOSIS — M25561 Pain in right knee: Secondary | ICD-10-CM

## 2018-01-25 DIAGNOSIS — M6281 Muscle weakness (generalized): Secondary | ICD-10-CM

## 2018-01-25 DIAGNOSIS — M25562 Pain in left knee: Secondary | ICD-10-CM | POA: Diagnosis not present

## 2018-01-25 DIAGNOSIS — R262 Difficulty in walking, not elsewhere classified: Secondary | ICD-10-CM | POA: Diagnosis not present

## 2018-01-25 DIAGNOSIS — G8929 Other chronic pain: Secondary | ICD-10-CM | POA: Diagnosis not present

## 2018-01-25 DIAGNOSIS — M25661 Stiffness of right knee, not elsewhere classified: Secondary | ICD-10-CM

## 2018-01-25 DIAGNOSIS — M25662 Stiffness of left knee, not elsewhere classified: Secondary | ICD-10-CM | POA: Diagnosis not present

## 2018-01-25 NOTE — Therapy (Signed)
[facility] [place] [address]  [place], [place], [address] Phone: [phone]   Fax:  [phone]  Physical Therapy Discharge Summary  Patient Details  Name: Morgan Fischer MRN: [id number] Date of Birth: [birth year] Referring Provider (PT): Benito Mccreedy, MD    Progress Note Reporting Period 12/18/17 to 01/25/18  See note below for Objective Data and Assessment of Progress/Goals.    Encounter Date: 01/25/2018  PT End of Session - 01/25/18 1210    Visit Number  4    Number of Visits  7    Date for PT Re-Evaluation  01/29/18    Authorization Type  [employer]    PT Start Time  [phone]    PT Stop Time  1012    PT Time Calculation (min)  49 min    Equipment Utilized During Treatment  Oxygen    Activity Tolerance  Patient tolerated treatment well;Patient limited by fatigue    Behavior During Therapy  [facility] for tasks assessed/performed       Past Medical History:  Diagnosis Date  . Arthritis   . Asthma   . CHF (congestive heart failure) ([place])   . COPD (chronic obstructive pulmonary disease) ([place])   . Diabetes mellitus without complication ([place])   . High cholesterol   . Hypertension   . Morbid obesity ([place])     Past Surgical History:  Procedure Laterality Date  . ABDOMINAL HYSTERECTOMY    . CESAREAN SECTION      Vitals:   01/25/18 0925 01/25/18 0935 01/25/18 0956  BP: (!) 153/60 122/62   Pulse: 69 62 63  SpO2: 98% 98% 97%    Subjective Assessment - 01/25/18 0922    Subjective  Reports that she has been a little more SOB today without known cause. Denies cough or chest pain. Reports that she is getting up a little better and feels a bit more stable with walking. Still struggling with knee pain.     Pertinent History  morbid obesity, HTN, HLD, DM, COPD, CHF, asthma, arthritis    Diagnostic tests  per patient- had recent xray on B knees which showed "bone on bone"    Patient Stated Goals  be able to walk  a little further, and be able to stand a little longer to do the dishes    Currently in Pain?  No/denies         [facility] PT Assessment - 01/25/18 0001      Observation/Other Assessments   Focus on Therapeutic Outcomes (FOTO)   Knee: 28 (72% limited, 51% predicted)      AROM   Right Knee Extension  0    Right Knee Flexion  101   pain in knee   Left Knee Extension  0    Left Knee Flexion  90   pain in knee cap     Strength   Right Hip Flexion  4/5    Right Hip ABduction  4+/5    Right Hip ADduction  4+/5    Left Hip Flexion  4/5    Left Hip ABduction  4+/5    Left Hip ADduction  4+/5    Right Knee Flexion  4/5   pain in knee   Right Knee Extension  4+/5    Left Knee Flexion  4/5   pain in knee   Left Knee Extension  4/5    Right Ankle Dorsiflexion  4+/5    Right Ankle Plantar Flexion  4+/5  Left Ankle Dorsiflexion  4+/5    Left Ankle Plantar Flexion  4+/5      Standardized Balance Assessment   Standardized Balance Assessment  Timed Up and Go Test      Timed Up and Go Test   Normal TUG (seconds)  17.6    TUG Comments  from chair without UE support; with [facility]                   [facility] Adult PT Treatment/Exercise - 01/25/18 0001      Knee/Hip Exercises: Standing   Knee Flexion  Strengthening;Right;Left;1 set;10 reps;Limitations    Knee Flexion Limitations  2#; at treadmill rail    Hip Flexion  Stengthening;Right;Left;1 set;Knee straight;20 reps    Hip Flexion Limitations  marching with 3# at treadmill rail    Hip Abduction  Stengthening;Right;Left;1 set;10 reps;Knee straight    Abduction Limitations  2#; at treadmill rail      Knee/Hip Exercises: Seated   Sit to Sand  5 reps;without UE support;1 set   1 foam pad under bottom            PT Education - 01/25/18 1209    Education Details  discussion on objective progress with PT, edu on continued compliance with HEP and use of ankle weights for light progression of exercises    Person(s) Educated   Patient;Caregiver(s)    Methods  Explanation;Demonstration;Tactile cues;Verbal cues;Handout    Comprehension  Verbalized understanding;Returned demonstration       PT Short Term Goals - 01/25/18 0932      PT SHORT TERM GOAL #1   Title  Patient to be independent with initial HEP.    Time  3    Period  Weeks    Status  Achieved        PT Long Term Goals - 01/25/18 0932      PT LONG TERM GOAL #1   Title  Patient to be independent with advanced HEP.    Time  6    Period  Weeks    Status  Achieved      PT LONG TERM GOAL #2   Title  Patient to demonstrate >=4+/5 strength in B LEs.    Time  6    Period  Weeks    Status  Partially Met   improvements demonstrated in B hip flexion, abduction, adduction, and R knee extension strength     PT LONG TERM GOAL #3   Title  Patient to demonstrate 0-100 degrees AROM in B knees in order to encourage proper gait pattern.    Time  6    Period  Weeks    Status  Partially Met   R knee 0-101 degrees, L knee 0-90 degrees     PT LONG TERM GOAL #4   Title  Patient to score <14 sec on TUG testing with SPC.    Time  6    Period  Weeks    Status  Not Met   scored 17.6 sec with use of SPC     PT LONG TERM GOAL #5   Title  Patient to report tolerance of 66mn of walking without knee pain limiting.     Time  6    Period  Weeks    Status  Not Met   reporting 1-2 min           Plan - 01/25/18 1217    Clinical Impression Statement  Patient arrived to session with caregiver, reporting  slightly increased SOB today. Took vitals- BP slightly elevated, however stabilized after sitting break. Patient notes that she has improved her ability to perform STS and has improved stability with walking since starting PT. Feels that she is ready to transition to HEP at this time. Improvements have been demonstrated in B hip flexion, abduction, adduction, and R knee extension strength. Patient now able to reach knee flexion of 101 degrees on R, 90 degrees on  L, showing excellent improvement. Patient scored 17.6 sec on TUG testing today with SPC, showing small improvement but still limited in gait speed. Patient notes that knee pain is still a problem, citing only 1-2 min of walking before onset of pain. Patient is independent with HEP and was educated on continued compliance with HEP and use of light ankle weights for progression of exercises at home. Patient tolerated today's session well, with vitals WFL throughout and no increased SOB. Ended session with no complaints. Patient to be D/C'd at this time per her request.     Clinical Impairments Affecting Rehab Potential  morbid obesity, HTN, HLD, DM, COPD, CHF, asthma, arthritis    PT Treatment/Interventions  ADLs/Self Care Home Management;Cryotherapy;Electrical Stimulation;Moist Heat;Ultrasound;DME Instruction;Gait training;Stair training;Functional mobility training;Patient/family education;Neuromuscular re-education;Balance training;Therapeutic exercise;Therapeutic activities;Manual techniques;Passive range of motion;Vasopneumatic Device;Taping;Splinting;Energy conservation;Dry needling    PT Next Visit Plan  D/C'd at this time    Consulted and Agree with Plan of Care  Patient       Patient will benefit from skilled therapeutic intervention in order to improve the following deficits and impairments:  Decreased range of motion, Difficulty walking, Decreased endurance, Decreased activity tolerance, Pain, Impaired flexibility, Improper body mechanics, Decreased balance, Decreased strength, Decreased mobility, Postural dysfunction  Visit Diagnosis: Chronic pain of left knee  Chronic pain of right knee  Stiffness of left knee, not elsewhere classified  Stiffness of right knee, not elsewhere classified  Difficulty in walking, not elsewhere classified  Muscle weakness (generalized)     Problem List Patient Active Problem List   Diagnosis Date Noted  . Acute hypercapnic respiratory failure ([place])  07/21/2016  . Drug allergy 07/21/2016  . Morbid obesity with BMI of 60.0-69.9, adult ([place]) 07/21/2016  . Acute on chronic congestive heart failure ([place]) 07/19/2016  . Acute respiratory acidosis 07/19/2016  . Acute respiratory distress 07/19/2016  . Chronic obstructive pulmonary disease with acute exacerbation ([place]) 07/19/2016  . Hyperglycemia due to type 2 diabetes mellitus ([place]) 07/19/2016  . Leukocytosis 07/19/2016  . Chronic diastolic heart failure ([place]) 03/17/2016  . Hypertensive heart disease with heart failure ([place]) 03/17/2016  . Hypoxia 03/17/2016  . Aortic atherosclerosis ([place]) 03/17/2016  . Nonrheumatic aortic valve stenosis 03/17/2016  . Elevated troponin 08/24/2015  . SOB (shortness of breath) 08/24/2015  . Hypokalemia 08/24/2015  . Morbid obesity due to excess calories ([place])   . Secondary hypertension   . Diabetes mellitus without complication ([place])   . Asthma   . Hypomagnesemia   . Panniculitis 08/23/2015    PHYSICAL THERAPY DISCHARGE SUMMARY  Visits from Start of Care: 4  Current functional level related to goals / functional outcomes: See above clinical impression   Remaining deficits: Decreased walking tolerance, ROM, strength, gait speed   Education / Equipment: HEP  Plan: Patient agrees to discharge.  Patient goals were partially met. Patient is being discharged due to the patient's request.  ?????     Janene Harvey, PT, DPT 01/25/18 12:19 PM    [facility] [place] [address]  [place], [place], [address] Phone: [phone]   Fax:  [phone]  Name: Morgan Fischer MRN: [id number] Date of Birth: [birth year]

## 2018-01-29 DIAGNOSIS — I2781 Cor pulmonale (chronic): Secondary | ICD-10-CM | POA: Diagnosis not present

## 2018-01-29 DIAGNOSIS — G4739 Other sleep apnea: Secondary | ICD-10-CM | POA: Diagnosis not present

## 2018-01-29 DIAGNOSIS — J45909 Unspecified asthma, uncomplicated: Secondary | ICD-10-CM | POA: Diagnosis not present

## 2018-01-29 DIAGNOSIS — Z6841 Body Mass Index (BMI) 40.0 and over, adult: Secondary | ICD-10-CM | POA: Diagnosis not present

## 2018-01-29 DIAGNOSIS — J45998 Other asthma: Secondary | ICD-10-CM | POA: Diagnosis not present

## 2018-02-01 ENCOUNTER — Encounter: Payer: Medicare HMO | Admitting: Physical Therapy

## 2018-02-05 DIAGNOSIS — M79672 Pain in left foot: Secondary | ICD-10-CM | POA: Diagnosis not present

## 2018-02-05 DIAGNOSIS — E1159 Type 2 diabetes mellitus with other circulatory complications: Secondary | ICD-10-CM | POA: Diagnosis not present

## 2018-02-05 DIAGNOSIS — B351 Tinea unguium: Secondary | ICD-10-CM | POA: Diagnosis not present

## 2018-02-05 DIAGNOSIS — L84 Corns and callosities: Secondary | ICD-10-CM | POA: Diagnosis not present

## 2018-02-05 DIAGNOSIS — M79671 Pain in right foot: Secondary | ICD-10-CM | POA: Diagnosis not present

## 2018-02-12 DIAGNOSIS — J45909 Unspecified asthma, uncomplicated: Secondary | ICD-10-CM | POA: Diagnosis not present

## 2018-02-12 DIAGNOSIS — J449 Chronic obstructive pulmonary disease, unspecified: Secondary | ICD-10-CM | POA: Diagnosis not present

## 2018-02-22 DIAGNOSIS — J449 Chronic obstructive pulmonary disease, unspecified: Secondary | ICD-10-CM | POA: Diagnosis not present

## 2018-02-22 DIAGNOSIS — J45909 Unspecified asthma, uncomplicated: Secondary | ICD-10-CM | POA: Diagnosis not present

## 2018-03-13 DIAGNOSIS — J449 Chronic obstructive pulmonary disease, unspecified: Secondary | ICD-10-CM | POA: Diagnosis not present

## 2018-03-13 DIAGNOSIS — J45909 Unspecified asthma, uncomplicated: Secondary | ICD-10-CM | POA: Diagnosis not present

## 2018-03-23 DIAGNOSIS — J45909 Unspecified asthma, uncomplicated: Secondary | ICD-10-CM | POA: Diagnosis not present

## 2018-03-23 DIAGNOSIS — J449 Chronic obstructive pulmonary disease, unspecified: Secondary | ICD-10-CM | POA: Diagnosis not present

## 2018-04-13 DIAGNOSIS — J449 Chronic obstructive pulmonary disease, unspecified: Secondary | ICD-10-CM | POA: Diagnosis not present

## 2018-04-13 DIAGNOSIS — J45909 Unspecified asthma, uncomplicated: Secondary | ICD-10-CM | POA: Diagnosis not present

## 2018-04-15 DIAGNOSIS — J449 Chronic obstructive pulmonary disease, unspecified: Secondary | ICD-10-CM | POA: Diagnosis not present

## 2018-04-15 DIAGNOSIS — J45909 Unspecified asthma, uncomplicated: Secondary | ICD-10-CM | POA: Diagnosis not present

## 2018-04-23 DIAGNOSIS — J45909 Unspecified asthma, uncomplicated: Secondary | ICD-10-CM | POA: Diagnosis not present

## 2018-04-23 DIAGNOSIS — J449 Chronic obstructive pulmonary disease, unspecified: Secondary | ICD-10-CM | POA: Diagnosis not present

## 2018-05-28 DIAGNOSIS — J449 Chronic obstructive pulmonary disease, unspecified: Secondary | ICD-10-CM | POA: Diagnosis not present

## 2018-06-13 DIAGNOSIS — J45909 Unspecified asthma, uncomplicated: Secondary | ICD-10-CM | POA: Diagnosis not present

## 2018-06-13 DIAGNOSIS — E119 Type 2 diabetes mellitus without complications: Secondary | ICD-10-CM | POA: Diagnosis not present

## 2018-06-13 DIAGNOSIS — I872 Venous insufficiency (chronic) (peripheral): Secondary | ICD-10-CM | POA: Diagnosis not present

## 2018-06-13 DIAGNOSIS — I119 Hypertensive heart disease without heart failure: Secondary | ICD-10-CM | POA: Diagnosis not present

## 2018-06-13 DIAGNOSIS — I10 Essential (primary) hypertension: Secondary | ICD-10-CM | POA: Diagnosis not present

## 2018-06-13 DIAGNOSIS — Z0001 Encounter for general adult medical examination with abnormal findings: Secondary | ICD-10-CM | POA: Diagnosis not present

## 2018-06-13 DIAGNOSIS — E611 Iron deficiency: Secondary | ICD-10-CM | POA: Diagnosis not present

## 2018-06-13 DIAGNOSIS — G4733 Obstructive sleep apnea (adult) (pediatric): Secondary | ICD-10-CM | POA: Diagnosis not present

## 2018-06-13 DIAGNOSIS — E785 Hyperlipidemia, unspecified: Secondary | ICD-10-CM | POA: Diagnosis not present

## 2018-06-28 DIAGNOSIS — J449 Chronic obstructive pulmonary disease, unspecified: Secondary | ICD-10-CM | POA: Diagnosis not present

## 2018-07-15 DIAGNOSIS — J45909 Unspecified asthma, uncomplicated: Secondary | ICD-10-CM | POA: Diagnosis not present

## 2018-07-15 DIAGNOSIS — J449 Chronic obstructive pulmonary disease, unspecified: Secondary | ICD-10-CM | POA: Diagnosis not present

## 2018-07-28 DIAGNOSIS — J449 Chronic obstructive pulmonary disease, unspecified: Secondary | ICD-10-CM | POA: Diagnosis not present

## 2018-08-28 DIAGNOSIS — J449 Chronic obstructive pulmonary disease, unspecified: Secondary | ICD-10-CM | POA: Diagnosis not present

## 2018-09-28 DIAGNOSIS — J449 Chronic obstructive pulmonary disease, unspecified: Secondary | ICD-10-CM | POA: Diagnosis not present

## 2018-10-20 ENCOUNTER — Encounter (HOSPITAL_BASED_OUTPATIENT_CLINIC_OR_DEPARTMENT_OTHER): Payer: Self-pay | Admitting: *Deleted

## 2018-10-20 ENCOUNTER — Other Ambulatory Visit: Payer: Self-pay

## 2018-10-20 ENCOUNTER — Emergency Department (HOSPITAL_BASED_OUTPATIENT_CLINIC_OR_DEPARTMENT_OTHER): Payer: Medicare HMO

## 2018-10-20 ENCOUNTER — Inpatient Hospital Stay (HOSPITAL_BASED_OUTPATIENT_CLINIC_OR_DEPARTMENT_OTHER)
Admission: EM | Admit: 2018-10-20 | Discharge: 2018-10-24 | DRG: 291 | Disposition: A | Discharge status: Home Health | Payer: Medicare HMO | Attending: Internal Medicine | Admitting: Internal Medicine

## 2018-10-20 DIAGNOSIS — Z20828 Contact with and (suspected) exposure to other viral communicable diseases: Secondary | ICD-10-CM | POA: Diagnosis present

## 2018-10-20 DIAGNOSIS — D649 Anemia, unspecified: Secondary | ICD-10-CM | POA: Diagnosis present

## 2018-10-20 DIAGNOSIS — Z8249 Family history of ischemic heart disease and other diseases of the circulatory system: Secondary | ICD-10-CM

## 2018-10-20 DIAGNOSIS — I442 Atrioventricular block, complete: Secondary | ICD-10-CM | POA: Diagnosis present

## 2018-10-20 DIAGNOSIS — Z7982 Long term (current) use of aspirin: Secondary | ICD-10-CM

## 2018-10-20 DIAGNOSIS — J441 Chronic obstructive pulmonary disease with (acute) exacerbation: Secondary | ICD-10-CM | POA: Diagnosis present

## 2018-10-20 DIAGNOSIS — J449 Chronic obstructive pulmonary disease, unspecified: Secondary | ICD-10-CM | POA: Diagnosis present

## 2018-10-20 DIAGNOSIS — Z794 Long term (current) use of insulin: Secondary | ICD-10-CM | POA: Diagnosis not present

## 2018-10-20 DIAGNOSIS — I10 Essential (primary) hypertension: Secondary | ICD-10-CM | POA: Diagnosis not present

## 2018-10-20 DIAGNOSIS — J209 Acute bronchitis, unspecified: Secondary | ICD-10-CM | POA: Diagnosis present

## 2018-10-20 DIAGNOSIS — G4730 Sleep apnea, unspecified: Secondary | ICD-10-CM | POA: Diagnosis present

## 2018-10-20 DIAGNOSIS — I350 Nonrheumatic aortic (valve) stenosis: Secondary | ICD-10-CM | POA: Diagnosis present

## 2018-10-20 DIAGNOSIS — K5900 Constipation, unspecified: Secondary | ICD-10-CM | POA: Diagnosis present

## 2018-10-20 DIAGNOSIS — I5033 Acute on chronic diastolic (congestive) heart failure: Secondary | ICD-10-CM | POA: Diagnosis present

## 2018-10-20 DIAGNOSIS — J9622 Acute and chronic respiratory failure with hypercapnia: Secondary | ICD-10-CM | POA: Diagnosis present

## 2018-10-20 DIAGNOSIS — E785 Hyperlipidemia, unspecified: Secondary | ICD-10-CM | POA: Diagnosis present

## 2018-10-20 DIAGNOSIS — I110 Hypertensive heart disease with heart failure: Principal | ICD-10-CM | POA: Diagnosis present

## 2018-10-20 DIAGNOSIS — I371 Nonrheumatic pulmonary valve insufficiency: Secondary | ICD-10-CM | POA: Diagnosis not present

## 2018-10-20 DIAGNOSIS — Z23 Encounter for immunization: Secondary | ICD-10-CM

## 2018-10-20 DIAGNOSIS — Z833 Family history of diabetes mellitus: Secondary | ICD-10-CM

## 2018-10-20 DIAGNOSIS — Z6841 Body Mass Index (BMI) 40.0 and over, adult: Secondary | ICD-10-CM

## 2018-10-20 DIAGNOSIS — I421 Obstructive hypertrophic cardiomyopathy: Secondary | ICD-10-CM | POA: Diagnosis present

## 2018-10-20 DIAGNOSIS — E876 Hypokalemia: Secondary | ICD-10-CM | POA: Diagnosis not present

## 2018-10-20 DIAGNOSIS — Z7951 Long term (current) use of inhaled steroids: Secondary | ICD-10-CM

## 2018-10-20 DIAGNOSIS — E1165 Type 2 diabetes mellitus with hyperglycemia: Secondary | ICD-10-CM | POA: Diagnosis present

## 2018-10-20 DIAGNOSIS — I5032 Chronic diastolic (congestive) heart failure: Secondary | ICD-10-CM | POA: Diagnosis not present

## 2018-10-20 DIAGNOSIS — Z9981 Dependence on supplemental oxygen: Secondary | ICD-10-CM

## 2018-10-20 DIAGNOSIS — Z803 Family history of malignant neoplasm of breast: Secondary | ICD-10-CM

## 2018-10-20 DIAGNOSIS — J9621 Acute and chronic respiratory failure with hypoxia: Secondary | ICD-10-CM | POA: Diagnosis present

## 2018-10-20 DIAGNOSIS — Z79899 Other long term (current) drug therapy: Secondary | ICD-10-CM | POA: Diagnosis not present

## 2018-10-20 DIAGNOSIS — R0602 Shortness of breath: Secondary | ICD-10-CM | POA: Diagnosis not present

## 2018-10-20 DIAGNOSIS — I361 Nonrheumatic tricuspid (valve) insufficiency: Secondary | ICD-10-CM | POA: Diagnosis not present

## 2018-10-20 DIAGNOSIS — J440 Chronic obstructive pulmonary disease with acute lower respiratory infection: Secondary | ICD-10-CM | POA: Diagnosis present

## 2018-10-20 HISTORY — DX: Acute and chronic respiratory failure with hypoxia: J96.21

## 2018-10-20 LAB — CBC WITH DIFFERENTIAL/PLATELET
Abs Immature Granulocytes: 0.03 10*3/uL (ref 0.00–0.07)
Basophils Absolute: 0 10*3/uL (ref 0.0–0.1)
Basophils Relative: 0 %
Eosinophils Absolute: 0 10*3/uL (ref 0.0–0.5)
Eosinophils Relative: 0 %
HCT: 38.8 % (ref 36.0–46.0)
Hemoglobin: 11.3 g/dL — ABNORMAL LOW (ref 12.0–15.0)
Immature Granulocytes: 0 %
Lymphocytes Relative: 32 %
Lymphs Abs: 2.3 10*3/uL (ref 0.7–4.0)
MCH: 26.8 pg (ref 26.0–34.0)
MCHC: 29.1 g/dL — ABNORMAL LOW (ref 30.0–36.0)
MCV: 92.2 fL (ref 80.0–100.0)
Monocytes Absolute: 0.3 10*3/uL (ref 0.1–1.0)
Monocytes Relative: 5 %
Neutro Abs: 4.5 10*3/uL (ref 1.7–7.7)
Neutrophils Relative %: 63 %
Platelets: 149 10*3/uL — ABNORMAL LOW (ref 150–400)
RBC: 4.21 MIL/uL (ref 3.87–5.11)
RDW: 15.7 % — ABNORMAL HIGH (ref 11.5–15.5)
WBC: 7.2 10*3/uL (ref 4.0–10.5)
nRBC: 0 % (ref 0.0–0.2)

## 2018-10-20 LAB — BASIC METABOLIC PANEL
Anion gap: 12 (ref 5–15)
BUN: 21 mg/dL (ref 8–23)
CO2: 33 mmol/L — ABNORMAL HIGH (ref 22–32)
Calcium: 9.2 mg/dL (ref 8.9–10.3)
Chloride: 99 mmol/L (ref 98–111)
Creatinine, Ser: 0.67 mg/dL (ref 0.44–1.00)
GFR calc Af Amer: >60 mL/min (ref >60)
GFR calc non Af Amer: >60 mL/min (ref >60)
Glucose, Bld: 226 mg/dL — ABNORMAL HIGH (ref 70–99)
Potassium: 3.6 mmol/L (ref 3.5–5.1)
Sodium: 144 mmol/L (ref 135–145)

## 2018-10-20 LAB — BRAIN NATRIURETIC PEPTIDE: B Natriuretic Peptide: 671.3 pg/mL — ABNORMAL HIGH (ref 0.0–100.0)

## 2018-10-20 LAB — SARS CORONAVIRUS 2 BY RT PCR (HOSPITAL ORDER, PERFORMED IN ~~LOC~~ HOSPITAL LAB): SARS Coronavirus 2: NEGATIVE

## 2018-10-20 LAB — TROPONIN I (HIGH SENSITIVITY): Troponin I (High Sensitivity): 18 ng/L — ABNORMAL HIGH (ref <18)

## 2018-10-20 MED ORDER — ALBUTEROL SULFATE HFA 108 (90 BASE) MCG/ACT IN AERS
INHALATION_SPRAY | RESPIRATORY_TRACT | Status: AC
  Administered 2018-10-20: 8
  Filled 2018-10-20: qty 6.7

## 2018-10-20 MED ORDER — FUROSEMIDE 10 MG/ML IJ SOLN: 40.0000 mg | Freq: Once | INTRAMUSCULAR | Status: DC

## 2018-10-20 MED ORDER — METHYLPREDNISOLONE SODIUM SUCC 125 MG IJ SOLR
125.0000 mg | Freq: Once | INTRAMUSCULAR | Status: AC
  Administered 2018-10-20: 125 mg via INTRAVENOUS
  Filled 2018-10-20: qty 2

## 2018-10-20 MED ORDER — INSULIN ASPART 100 UNIT/ML ~~LOC~~ SOLN
0.0000 [IU] | Freq: Three times a day (TID) | SUBCUTANEOUS | Status: DC
  Administered 2018-10-21: 8 [IU] via SUBCUTANEOUS
  Administered 2018-10-21 – 2018-10-22 (×2): 5 [IU] via SUBCUTANEOUS

## 2018-10-20 MED ORDER — FUROSEMIDE 10 MG/ML IJ SOLN
60.0000 mg | Freq: Once | INTRAMUSCULAR | Status: AC
  Administered 2018-10-20: 60 mg via INTRAVENOUS
  Filled 2018-10-20: qty 6

## 2018-10-20 MED ORDER — LOSARTAN POTASSIUM 50 MG PO TABS
100.0000 mg | ORAL_TABLET | Freq: Every day | ORAL | Status: DC
  Administered 2018-10-21 – 2018-10-24 (×4): 100 mg via ORAL
  Filled 2018-10-20 (×4): qty 2

## 2018-10-20 MED ORDER — ATENOLOL 25 MG PO TABS
100.0000 mg | ORAL_TABLET | Freq: Every day | ORAL | Status: DC
  Administered 2018-10-21 – 2018-10-23 (×3): 100 mg via ORAL
  Filled 2018-10-20 (×3): qty 4

## 2018-10-20 MED ORDER — GLIMEPIRIDE 4 MG PO TABS
4.0000 mg | ORAL_TABLET | Freq: Every day | ORAL | Status: DC
  Filled 2018-10-20: qty 1

## 2018-10-20 MED ORDER — ALBUTEROL SULFATE HFA 108 (90 BASE) MCG/ACT IN AERS
2.0000 | INHALATION_SPRAY | RESPIRATORY_TRACT | Status: DC | PRN
  Filled 2018-10-20: qty 6.7

## 2018-10-20 MED ORDER — ALBUTEROL SULFATE (2.5 MG/3ML) 0.083% IN NEBU
2.5000 mg | INHALATION_SOLUTION | Freq: Once | RESPIRATORY_TRACT | Status: AC
  Administered 2018-10-20: 2.5 mg via RESPIRATORY_TRACT
  Filled 2018-10-20: qty 3

## 2018-10-20 NOTE — ED Provider Notes (Signed)
[facility] EMERGENCY DEPARTMENT Provider Note   CSN: [id number] Arrival date & time: 10/20/18  1749     History   Chief Complaint Chief Complaint  Patient presents with  . Shortness of Breath    HPI Morgan Fischer is a 68 y.o. female.    Patient is a 68 year old female with a history of asthma, CHF,, COPD, diabetes hyperlipidemia and hypertension who presents with shortness of breath.  She states that she started this afternoon and has progressively gotten worse.  She tried her home medications without improvement in symptoms.  She denies any cough.  No fevers.  No rhinorrhea.  No associated chest pain.  She normally is on 2 L of oxygen at home at baseline.  She does report a little bit of increase in her leg swelling.       Past Medical History:  Diagnosis Date  . Arthritis   . Asthma   . CHF (congestive heart failure) (HCC)   . COPD (chronic obstructive pulmonary disease) (HCC)   . Diabetes mellitus without complication (HCC)   . High cholesterol   . Hypertension   . Morbid obesity [facility])     Patient Active Problem List   Diagnosis Date Noted  . Acute on chronic respiratory failure with hypoxia (HCC) 10/20/2018  . Acute hypercapnic respiratory failure (HCC) 07/21/2016  . Drug allergy 07/21/2016  . Morbid obesity with BMI of 60.0-69.9, adult (HCC) 07/21/2016  . Acute on chronic congestive heart failure (HCC) 07/19/2016  . Acute respiratory acidosis 07/19/2016  . Acute respiratory distress 07/19/2016  . Chronic obstructive pulmonary disease with acute exacerbation (HCC) 07/19/2016  . Hyperglycemia due to type 2 diabetes mellitus (HCC) 07/19/2016  . Leukocytosis 07/19/2016  . Chronic diastolic heart failure (HCC) 03/17/2016  . Hypertensive heart disease with heart failure (HCC) 03/17/2016  . Hypoxia 03/17/2016  . Aortic atherosclerosis (HCC) 03/17/2016  . Nonrheumatic aortic valve stenosis 03/17/2016  . Elevated troponin 08/24/2015  . SOB (shortness of  breath) 08/24/2015  . Hypokalemia 08/24/2015  . Morbid obesity due to excess calories (HCC)   . Secondary hypertension   . Diabetes mellitus without complication (HCC)   . Asthma   . Hypomagnesemia   . Panniculitis 08/23/2015    Past Surgical History:  Procedure Laterality Date  . ABDOMINAL HYSTERECTOMY    . CESAREAN SECTION       OB History   No obstetric history on file.      Home Medications    Prior to Admission medications   Medication Sig Start Date End Date Taking? Authorizing Provider  ACCU-CHEK AVIVA PLUS test strip Inject 1 applicator into the skin as needed. 07/20/15   [provider]  allopurinol (ZYLOPRIM) 300 MG tablet Take 300 mg by mouth daily.    [provider]  amLODipine (NORVASC) 5 MG tablet Take 1 tablet by mouth daily. 08/14/16   Hedwig Morton., MD  aspirin 325 MG EC tablet Take 325 mg by mouth daily.    [provider]  atenolol (TENORMIN) 100 MG tablet Take 1 tablet (100 mg total) by mouth daily. 08/28/15   Leroy Sea, MD  Calcium Carb-Cholecalciferol (CALCIUM-VITAMIN D3) 500-400 MG-UNIT TABS Take 1 tablet by mouth 2 (two) times daily.    [provider]  Cholecalciferol (VITAMIN D3) 2000 units capsule Take 1 capsule by mouth daily.    Bonsu, Osei A, DO  Cyanocobalamin (VITAMIN B-12 PO) Take 1 tablet by mouth daily.    [provider]  Dulaglutide (  TRULICITY) 6.30 ZS/0.1UX SOPN Inject 1 application into the skin once a week.     [provider]  Fluticasone-Salmeterol (ADVAIR) 250-50 MCG/DOSE AEPB Inhale 1 puff into the lungs 2 (two) times daily.    [provider]  furosemide (LASIX) 40 MG tablet Take 1 tablet (40 mg total) by mouth 2 (two) times daily. 08/28/15   Thurnell Lose, MD  Garlic 3235 MG CAPS Take 1,000 mg by mouth daily.    [provider]  glimepiride (AMARYL) 4 MG tablet Take 4 mg by mouth daily.     [provider]  hydrALAZINE (APRESOLINE) 25 MG  tablet Take 25 mg by mouth every 8 (eight) hours. 07/25/16 08/24/16  [provider]  hydrOXYzine (ATARAX/VISTARIL) 10 MG tablet Take 10 mg by mouth 3 (three) times daily as needed.    [provider]  ibuprofen (ADVIL,MOTRIN) 200 MG tablet Take 3-4 tablets as needed for pain (patient takes about 4 tablets per week max)    [provider]  ipratropium-albuterol (DUONEB) 0.5-2.5 (3) MG/3ML SOLN Use 1 vial in nebulizer every 4 hours as needed    Beauford, Patrick Jupiter, MD  LANTUS SOLOSTAR 100 UNIT/ML Solostar Pen Inject 15 Units into the skin daily.  06/07/15   Bonsu, Osei A, DO  losartan (COZAAR) 100 MG tablet Take 1 tablet by mouth daily.    Bonsu, Osei A, DO  montelukast (SINGULAIR) 10 MG tablet Take 10 mg by mouth at bedtime.    [provider]  Multiple Vitamin (MULTIVITAMIN WITH MINERALS) TABS tablet Take 1 tablet by mouth daily.    [provider]  nitroGLYCERIN (NITROSTAT) 0.4 MG SL tablet Place 1 tablet (0.4 mg total) under the tongue every 5 (five) minutes as needed for chest pain. 08/28/15   Thurnell Lose, MD  Omega-3 Fatty Acids (FISH OIL) 1000 MG CAPS Take 1,000 mg by mouth 2 (two) times daily.     [provider]  potassium chloride (K-DUR,KLOR-CON) 10 MEQ tablet Take 20 mEq by mouth once.    [provider]  rosuvastatin (CRESTOR) 40 MG tablet Take 40 mg by mouth daily.    [provider]  Vitamins/Minerals TABS Take 1 tablet by mouth daily.    [provider]    Family History Family History  Problem Relation Age of Onset  . Diabetes Mellitus I Mother   . CAD Mother   . Diabetes Mother   . Heart disease Father   . Cancer Father   . Breast cancer Sister   . Cancer Sister     Social History Social History   Tobacco Use  . Smoking status: Never Smoker  . Smokeless tobacco: Never Used  Substance Use Topics  . Alcohol use: No  . Drug use: No     Allergies   Atorvastatin, Ciprocinonide  [fluocinolone], Catapres [clonidine hcl], and Demadex [torsemide]   Review of Systems Review of Systems  Constitutional: Positive for fatigue. Negative for chills, diaphoresis and fever.  HENT: Negative for congestion, rhinorrhea and sneezing.   Eyes: Negative.   Respiratory: Positive for shortness of breath. Negative for cough and chest tightness.   Cardiovascular: Positive for leg swelling. Negative for chest pain.  Gastrointestinal: Negative for abdominal pain, blood in stool, diarrhea, nausea and vomiting.  Genitourinary: Negative for difficulty urinating, flank pain, frequency and hematuria.  Musculoskeletal: Negative for arthralgias and back pain.  Skin: Negative for rash.  Neurological: Negative for dizziness, speech difficulty, weakness, numbness and headaches.  Physical Exam Updated Vital Signs BP (!) 175/69   Pulse 62   Temp 98.2 F (36.8 C) (Oral)   Resp (!) 30   Ht 5' 3.5" (1.613 m)   Wt 132 kg   SpO2 100%   BMI 50.74 kg/m   Physical Exam Constitutional:      Appearance: She is well-developed.     Comments: Obese  HENT:     Head: Normocephalic and atraumatic.  Eyes:     Pupils: Pupils are equal, round, and reactive to light.  Neck:     Musculoskeletal: Normal range of motion and neck supple.  Cardiovascular:     Rate and Rhythm: Normal rate and regular rhythm.     Heart sounds: Normal heart sounds.  Pulmonary:     Effort: Tachypnea, accessory muscle usage and respiratory distress present.     Breath sounds: Decreased breath sounds and wheezing present. No rales.  Chest:     Chest wall: No tenderness.  Abdominal:     General: Bowel sounds are normal.     Palpations: Abdomen is soft.     Tenderness: There is no abdominal tenderness. There is no guarding or rebound.  Musculoskeletal: Normal range of motion.     Right lower leg: Edema present.     Left lower leg: Edema present.  Lymphadenopathy:     Cervical: No cervical adenopathy.  Skin:     General: Skin is warm and dry.     Findings: No rash.  Neurological:     Mental Status: She is alert and oriented to person, place, and time.      ED Treatments / Results  Labs (all labs ordered are listed, but only abnormal results are displayed) Labs Reviewed  BASIC METABOLIC PANEL - Abnormal; Notable for the following components:      Result Value   CO2 33 (*)    Glucose, Bld 226 (*)    All other components within normal limits  CBC WITH DIFFERENTIAL/PLATELET - Abnormal; Notable for the following components:   Hemoglobin 11.3 (*)    MCHC 29.1 (*)    RDW 15.7 (*)    Platelets 149 (*)    All other components within normal limits  BRAIN NATRIURETIC PEPTIDE - Abnormal; Notable for the following components:   B Natriuretic Peptide 671.3 (*)    All other components within normal limits  TROPONIN I (HIGH SENSITIVITY) - Abnormal; Notable for the following components:   Troponin I (High Sensitivity) 18 (*)    All other components within normal limits  SARS CORONAVIRUS 2 BY RT PCR (HOSPITAL ORDER, PERFORMED IN [facility])  TROPONIN I (HIGH SENSITIVITY)    EKG EKG Interpretation  Date/Time:  Sunday October 20 2018 18:10:08 EDT Ventricular Rate:  82 PR Interval:    QRS Duration: 117 QT Interval:  419 QTC Calculation: 499 R Axis:   69 Text Interpretation:  Sinus rhythm Atrial premature complex Nonspecific intraventricular conduction delay Abnormal T, consider ischemia, lateral leads Baseline wander in lead(s) II aVR since last tracing no significant change Confirmed by Rolan Bucco ([id number]) on 10/20/2018 7:27:34 PM   Radiology Dg Chest Port 1 View  Result Date: 10/20/2018 CLINICAL DATA:  Increased shortness of breath. EXAM: PORTABLE CHEST 1 VIEW COMPARISON:  Multiple chest x-rays since October 2009. FINDINGS: Fullness in the right hilum is identified. This is more prominent compared to previous studies but was similar when compared to November 2018.  Cardiomegaly. Left hilum is largely obscured due to patient rotation.  No pneumothorax. No overt edema or focal infiltrate. IMPRESSION: 1. Increased prominence of the right hilum is probably due to patient rotation. A CT scan from July 2018 demonstrated an enlarged pulmonary artery consistent with pulmonary arterial hypertension. 2. Mild atelectasis in the right base.  No overt edema. 3. No other acute abnormalities. Electronically Signed   By: Gerome Samavid  Williams III M.D   On: 10/20/2018 18:49    Procedures Procedures (including critical care time)  Medications Ordered in ED Medications  methylPREDNISolone sodium succinate (SOLU-MEDROL) 125 mg/2 mL injection 125 mg (125 mg Intravenous Given 10/20/18 1907)  albuterol (VENTOLIN HFA) 108 (90 Base) MCG/ACT inhaler (8 puffs  Given 10/20/18 1810)  furosemide (LASIX) injection 60 mg (60 mg Intravenous Given 10/20/18 2006)     Initial Impression / Assessment and Plan / ED Course  I have reviewed the triage vital signs and the nursing notes.  Pertinent labs & imaging results that were available during my care of the patient were reviewed by me and considered in my medical decision making (see chart for details).        Patient is a 68 year old female with a history of COPD and CHF.  She was significantly short of breath on arrival with pursed lips and accessory muscle use.  She was given albuterol and Solu-Medrol.  She had diminished breath sounds bilaterally.  She had a chest x-ray which showed no acute abnormalities.  No evidence of pneumonia.  Her Covid test is negative.  Rapid Covid was performed given her potential need for BiPAP.  Her BNP is elevated at just over 600.  She has had some increased leg swelling.  I feel that she likely has a combination of COPD and CHF exacerbation.  She has had significant improvement in the ED after treatment.  She no longer has accessory muscle use.  She is still requiring more oxygen than her baseline.  I spoke to  Dr. Sherald Bargepen who will admit her for further treatment.  Final Clinical Impressions(s) / ED Diagnoses   Final diagnoses:  Acute on chronic respiratory failure with hypoxia [facility](HCC)    ED Discharge Orders    None       Rolan BuccoBelfi, Lillyanne Bradburn, MD 10/20/18 2037

## 2018-10-20 NOTE — ED Notes (Signed)
Pt states that she has not had her Advair in the last 3-4 days because she is out, does have an apt to meet with her Dr next week.

## 2018-10-20 NOTE — ED Notes (Signed)
Pt bed soaked with urine despite pure wick in place. Pt bed changed and cleaned up. She continues pursed-lip breathing, SpO2 >95%. EDP notified of pt condition, and urine results from Lasix administration. Orders given, RT notified.

## 2018-10-20 NOTE — ED Triage Notes (Signed)
Pt reports increased SOB today. Hx of asthma and COPD. She is on 2L home oxygen. Speaking in phrases, SOB at rest, pursed lip breathing. EDP Belfi at bedside

## 2018-10-21 ENCOUNTER — Inpatient Hospital Stay (HOSPITAL_COMMUNITY): Payer: Medicare HMO

## 2018-10-21 DIAGNOSIS — E876 Hypokalemia: Secondary | ICD-10-CM | POA: Diagnosis not present

## 2018-10-21 DIAGNOSIS — G4730 Sleep apnea, unspecified: Secondary | ICD-10-CM | POA: Diagnosis present

## 2018-10-21 DIAGNOSIS — Z794 Long term (current) use of insulin: Secondary | ICD-10-CM

## 2018-10-21 DIAGNOSIS — I371 Nonrheumatic pulmonary valve insufficiency: Secondary | ICD-10-CM | POA: Diagnosis not present

## 2018-10-21 DIAGNOSIS — J209 Acute bronchitis, unspecified: Secondary | ICD-10-CM | POA: Diagnosis present

## 2018-10-21 DIAGNOSIS — Z79899 Other long term (current) drug therapy: Secondary | ICD-10-CM | POA: Diagnosis not present

## 2018-10-21 DIAGNOSIS — I10 Essential (primary) hypertension: Secondary | ICD-10-CM | POA: Diagnosis not present

## 2018-10-21 DIAGNOSIS — Z7982 Long term (current) use of aspirin: Secondary | ICD-10-CM | POA: Diagnosis not present

## 2018-10-21 DIAGNOSIS — Z23 Encounter for immunization: Secondary | ICD-10-CM | POA: Diagnosis present

## 2018-10-21 DIAGNOSIS — I5033 Acute on chronic diastolic (congestive) heart failure: Secondary | ICD-10-CM | POA: Diagnosis present

## 2018-10-21 DIAGNOSIS — I110 Hypertensive heart disease with heart failure: Secondary | ICD-10-CM | POA: Diagnosis present

## 2018-10-21 DIAGNOSIS — E785 Hyperlipidemia, unspecified: Secondary | ICD-10-CM | POA: Diagnosis present

## 2018-10-21 DIAGNOSIS — I361 Nonrheumatic tricuspid (valve) insufficiency: Secondary | ICD-10-CM | POA: Diagnosis not present

## 2018-10-21 DIAGNOSIS — E1165 Type 2 diabetes mellitus with hyperglycemia: Secondary | ICD-10-CM

## 2018-10-21 DIAGNOSIS — Z20828 Contact with and (suspected) exposure to other viral communicable diseases: Secondary | ICD-10-CM | POA: Diagnosis present

## 2018-10-21 DIAGNOSIS — Z6841 Body Mass Index (BMI) 40.0 and over, adult: Secondary | ICD-10-CM | POA: Diagnosis not present

## 2018-10-21 DIAGNOSIS — I421 Obstructive hypertrophic cardiomyopathy: Secondary | ICD-10-CM | POA: Diagnosis present

## 2018-10-21 DIAGNOSIS — I442 Atrioventricular block, complete: Secondary | ICD-10-CM | POA: Diagnosis present

## 2018-10-21 DIAGNOSIS — J440 Chronic obstructive pulmonary disease with acute lower respiratory infection: Secondary | ICD-10-CM | POA: Diagnosis present

## 2018-10-21 DIAGNOSIS — I350 Nonrheumatic aortic (valve) stenosis: Secondary | ICD-10-CM | POA: Diagnosis present

## 2018-10-21 DIAGNOSIS — D649 Anemia, unspecified: Secondary | ICD-10-CM | POA: Diagnosis present

## 2018-10-21 DIAGNOSIS — Z9981 Dependence on supplemental oxygen: Secondary | ICD-10-CM | POA: Diagnosis not present

## 2018-10-21 DIAGNOSIS — I5032 Chronic diastolic (congestive) heart failure: Secondary | ICD-10-CM | POA: Diagnosis not present

## 2018-10-21 DIAGNOSIS — K5900 Constipation, unspecified: Secondary | ICD-10-CM | POA: Diagnosis present

## 2018-10-21 DIAGNOSIS — J9622 Acute and chronic respiratory failure with hypercapnia: Secondary | ICD-10-CM | POA: Diagnosis present

## 2018-10-21 DIAGNOSIS — J9621 Acute and chronic respiratory failure with hypoxia: Secondary | ICD-10-CM | POA: Diagnosis present

## 2018-10-21 DIAGNOSIS — J441 Chronic obstructive pulmonary disease with (acute) exacerbation: Secondary | ICD-10-CM | POA: Diagnosis present

## 2018-10-21 LAB — ECHOCARDIOGRAM COMPLETE
Height: 63.5 in
Weight: 4656 oz

## 2018-10-21 LAB — HIV ANTIBODY (ROUTINE TESTING W REFLEX): HIV Screen 4th Generation wRfx: NONREACTIVE

## 2018-10-21 LAB — TROPONIN I (HIGH SENSITIVITY): Troponin I (High Sensitivity): 14 ng/L (ref <18)

## 2018-10-21 LAB — GLUCOSE, CAPILLARY
Glucose-Capillary: 236 mg/dL — ABNORMAL HIGH (ref 70–99)
Glucose-Capillary: 281 mg/dL — ABNORMAL HIGH (ref 70–99)
Glucose-Capillary: 294 mg/dL — ABNORMAL HIGH (ref 70–99)

## 2018-10-21 LAB — CBG MONITORING, ED: Glucose-Capillary: 226 mg/dL — ABNORMAL HIGH (ref 70–99)

## 2018-10-21 LAB — D-DIMER, QUANTITATIVE: D-Dimer, Quant: 0.43 ug/mL-FEU (ref 0.00–0.50)

## 2018-10-21 MED ORDER — METHYLPREDNISOLONE SODIUM SUCC 125 MG IJ SOLR
60.0000 mg | Freq: Two times a day (BID) | INTRAMUSCULAR | Status: DC
  Administered 2018-10-21: 60 mg via INTRAVENOUS
  Administered 2018-10-21: 50 mg via INTRAVENOUS
  Filled 2018-10-21 (×2): qty 2

## 2018-10-21 MED ORDER — MONTELUKAST SODIUM 10 MG PO TABS
10.0000 mg | ORAL_TABLET | Freq: Every day | ORAL | Status: DC
  Administered 2018-10-21 – 2018-10-23 (×3): 10 mg via ORAL
  Filled 2018-10-21 (×3): qty 1

## 2018-10-21 MED ORDER — CALCIUM CARBONATE-VITAMIN D 500-200 MG-UNIT PO TABS
1.0000 | ORAL_TABLET | Freq: Two times a day (BID) | ORAL | Status: DC
  Administered 2018-10-21 – 2018-10-24 (×7): 1 via ORAL
  Filled 2018-10-21 (×7): qty 1

## 2018-10-21 MED ORDER — MOMETASONE FURO-FORMOTEROL FUM 200-5 MCG/ACT IN AERO
2.0000 | INHALATION_SPRAY | Freq: Two times a day (BID) | RESPIRATORY_TRACT | Status: DC
  Administered 2018-10-22 – 2018-10-24 (×5): 2 via RESPIRATORY_TRACT
  Filled 2018-10-21 (×2): qty 8.8

## 2018-10-21 MED ORDER — ALLOPURINOL 300 MG PO TABS
300.0000 mg | ORAL_TABLET | Freq: Every day | ORAL | Status: DC
  Administered 2018-10-21 – 2018-10-24 (×4): 300 mg via ORAL
  Filled 2018-10-21 (×4): qty 1

## 2018-10-21 MED ORDER — SENNOSIDES-DOCUSATE SODIUM 8.6-50 MG PO TABS: 1.0000 | ORAL_TABLET | Freq: Every evening | ORAL | Status: DC | PRN

## 2018-10-21 MED ORDER — ENOXAPARIN SODIUM 80 MG/0.8ML ~~LOC~~ SOLN: 70.0000 mg | SUBCUTANEOUS | Status: DC

## 2018-10-21 MED ORDER — HYDROCODONE-ACETAMINOPHEN 5-325 MG PO TABS: 1.0000 | ORAL_TABLET | ORAL | Status: DC | PRN

## 2018-10-21 MED ORDER — ACETAMINOPHEN 325 MG PO TABS
650.0000 mg | ORAL_TABLET | Freq: Four times a day (QID) | ORAL | Status: DC | PRN
  Administered 2018-10-22: 650 mg via ORAL
  Filled 2018-10-21: qty 2

## 2018-10-21 MED ORDER — INFLUENZA VAC A&B SA ADJ QUAD 0.5 ML IM PRSY
0.5000 mL | PREFILLED_SYRINGE | INTRAMUSCULAR | Status: AC
  Administered 2018-10-22: 0.5 mL via INTRAMUSCULAR
  Filled 2018-10-21: qty 0.5

## 2018-10-21 MED ORDER — ASPIRIN EC 325 MG PO TBEC
325.0000 mg | DELAYED_RELEASE_TABLET | Freq: Every day | ORAL | Status: DC
  Administered 2018-10-21: 325 mg via ORAL
  Filled 2018-10-21 (×2): qty 1

## 2018-10-21 MED ORDER — NITROGLYCERIN 0.4 MG SL SUBL: 0.4000 mg | SUBLINGUAL_TABLET | SUBLINGUAL | Status: DC | PRN

## 2018-10-21 MED ORDER — AMLODIPINE BESYLATE 5 MG PO TABS
5.0000 mg | ORAL_TABLET | Freq: Every day | ORAL | Status: DC
  Filled 2018-10-21: qty 1

## 2018-10-21 MED ORDER — INSULIN GLARGINE 100 UNIT/ML ~~LOC~~ SOLN
15.0000 [IU] | Freq: Every day | SUBCUTANEOUS | Status: DC
  Administered 2018-10-21 – 2018-10-24 (×4): 15 [IU] via SUBCUTANEOUS
  Filled 2018-10-21 (×4): qty 0.15

## 2018-10-21 MED ORDER — DOXYCYCLINE HYCLATE 100 MG PO TABS
100.0000 mg | ORAL_TABLET | Freq: Two times a day (BID) | ORAL | Status: DC
  Administered 2018-10-21 – 2018-10-24 (×7): 100 mg via ORAL
  Filled 2018-10-21 (×7): qty 1

## 2018-10-21 MED ORDER — IPRATROPIUM-ALBUTEROL 0.5-2.5 (3) MG/3ML IN SOLN: 3.0000 mL | RESPIRATORY_TRACT | Status: DC | PRN

## 2018-10-21 MED ORDER — ENOXAPARIN SODIUM 60 MG/0.6ML ~~LOC~~ SOLN
60.0000 mg | SUBCUTANEOUS | Status: DC
  Administered 2018-10-21 – 2018-10-24 (×4): 60 mg via SUBCUTANEOUS
  Filled 2018-10-21 (×4): qty 0.6

## 2018-10-21 MED ORDER — FUROSEMIDE 10 MG/ML IJ SOLN
40.0000 mg | Freq: Two times a day (BID) | INTRAMUSCULAR | Status: DC
  Administered 2018-10-21 – 2018-10-22 (×3): 40 mg via INTRAVENOUS
  Filled 2018-10-21 (×3): qty 4

## 2018-10-21 MED ORDER — IPRATROPIUM-ALBUTEROL 0.5-2.5 (3) MG/3ML IN SOLN
3.0000 mL | Freq: Four times a day (QID) | RESPIRATORY_TRACT | Status: DC
  Administered 2018-10-21 (×3): 3 mL via RESPIRATORY_TRACT
  Filled 2018-10-21 (×3): qty 3

## 2018-10-21 MED ORDER — ACETAMINOPHEN 325 MG PO TABS
650.0000 mg | ORAL_TABLET | Freq: Once | ORAL | Status: AC
  Administered 2018-10-21: 650 mg via ORAL
  Filled 2018-10-21: qty 2

## 2018-10-21 MED ORDER — ONDANSETRON HCL 4 MG PO TABS: 4.0000 mg | ORAL_TABLET | Freq: Four times a day (QID) | ORAL | Status: DC | PRN

## 2018-10-21 MED ORDER — IPRATROPIUM-ALBUTEROL 0.5-2.5 (3) MG/3ML IN SOLN
3.0000 mL | Freq: Three times a day (TID) | RESPIRATORY_TRACT | Status: DC
  Administered 2018-10-22 – 2018-10-23 (×4): 3 mL via RESPIRATORY_TRACT
  Filled 2018-10-21 (×4): qty 3

## 2018-10-21 MED ORDER — ORAL CARE MOUTH RINSE
15.0000 mL | Freq: Two times a day (BID) | OROMUCOSAL | Status: DC
  Administered 2018-10-21 – 2018-10-24 (×6): 15 mL via OROMUCOSAL

## 2018-10-21 MED ORDER — ACETAMINOPHEN 650 MG RE SUPP: 650.0000 mg | Freq: Four times a day (QID) | RECTAL | Status: DC | PRN

## 2018-10-21 MED ORDER — ROSUVASTATIN CALCIUM 10 MG PO TABS
40.0000 mg | ORAL_TABLET | Freq: Every day | ORAL | Status: DC
  Administered 2018-10-21 – 2018-10-22 (×2): 40 mg via ORAL
  Filled 2018-10-21: qty 8
  Filled 2018-10-21 (×2): qty 4

## 2018-10-21 MED ORDER — ONDANSETRON HCL 4 MG/2ML IJ SOLN: 4.0000 mg | Freq: Four times a day (QID) | INTRAMUSCULAR | Status: DC | PRN

## 2018-10-21 NOTE — Evaluation (Signed)
Physical Therapy Evaluation Patient Details Name: Morgan Fischer MRN: [id number] DOB: 1950-12-28 Today's Date: 10/21/2018   History of Present Illness  68 yo female admitted to ED on 10/18 with acute on chronic respiratory failure. PMH includes COPD on 2LO2 chronically, HF, DM, HLD, HTN, morbid obesity.  Clinical Impression  Pt presents with generalized weakness, increased time and effort to mobilize, decreased activity tolerance, and increased work of breathing and O2 desaturation to 90% on 2LO2 during ambulation. Pt to benefit from acute PT to address deficits. Pt ambulated short room distance with SPC, requiring min guard assist for safety. PT to progress mobility as tolerated, and will continue to follow acutely.      Follow Up Recommendations Home health PT;Supervision for mobility/OOB    Equipment Recommendations  None recommended by PT    Recommendations for Other Services       Precautions / Restrictions Precautions Precautions: Fall Precaution Comments: watch sats Restrictions Weight Bearing Restrictions: No      Mobility  Bed Mobility Overal bed mobility: Needs Assistance Bed Mobility: Supine to Sit     Supine to sit: Min guard;HOB elevated     General bed mobility comments: Min guard for safety, pt with use of bedrails to come to sitting.  Transfers Overall transfer level: Needs assistance Equipment used: None Transfers: Sit to/from Stand Sit to Stand: Min guard;From elevated surface         General transfer comment: Min guard for safety, increased time to rise with use of UEs to push up.  Ambulation/Gait Ambulation/Gait assistance: Min guard Gait Distance (Feet): 15 Feet Assistive device: Straight cane Gait Pattern/deviations: Step-through pattern;Decreased stride length Gait velocity: decr   General Gait Details: Min guard for safety, verbal cuing for sequencing, increased time and increased lateral excursion L and R due to body habitus. Sats 90%  on 2Lo2 after ambulation, requiring breathing technique to recover.  Stairs            Wheelchair Mobility    Modified Rankin (Stroke Patients Only)       Balance Overall balance assessment: Mild deficits observed, not formally tested                                           Pertinent Vitals/Pain Pain Assessment: No/denies pain    Home Living Family/patient expects to be discharged to:: Private residence Living Arrangements: Children Available Help at Discharge: Family;Available PRN/intermittently;Personal care attendant(pt's caregiver comes in tues-friday, 9-1) Type of Home: House Home Access: Ramped entrance;Stairs to enter   Entrance Stairs-Number of Steps: 4-5 Home Layout: One level Home Equipment: Cane - single point      Prior Function Level of Independence: Independent with assistive device(s)         Comments: uses cane to ambulate     Hand Dominance   Dominant Hand: Right    Extremity/Trunk Assessment   Upper Extremity Assessment Upper Extremity Assessment: Overall WFL for tasks assessed    Lower Extremity Assessment Lower Extremity Assessment: Generalized weakness    Cervical / Trunk Assessment Cervical / Trunk Assessment: Normal  Communication   Communication: No difficulties  Cognition Arousal/Alertness: Awake/alert Behavior During Therapy: WFL for tasks assessed/performed Overall Cognitive Status: Within Functional Limits for tasks assessed  General Comments      Exercises     Assessment/Plan    PT Assessment Patient needs continued PT services  PT Problem List Decreased strength;Decreased mobility;Cardiopulmonary status limiting activity;Decreased activity tolerance;Decreased balance;Obesity       PT Treatment Interventions DME instruction;Therapeutic activities;Gait training;Patient/family education;Therapeutic exercise;Balance training;Functional  mobility training    PT Goals (Current goals can be found in the Care Plan section)  Acute Rehab PT Goals Patient Stated Goal: breathe better PT Goal Formulation: With patient Time For Goal Achievement: 11/04/18 Potential to Achieve Goals: Good    Frequency Min 3X/week   Barriers to discharge        Co-evaluation               AM-PAC PT "6 Clicks" Mobility  Outcome Measure Help needed turning from your back to your side while in a flat bed without using bedrails?: A Little Help needed moving from lying on your back to sitting on the side of a flat bed without using bedrails?: A Little Help needed moving to and from a bed to a chair (including a wheelchair)?: A Little Help needed standing up from a chair using your arms (e.g., wheelchair or bedside chair)?: A Little Help needed to walk in hospital room?: A Little Help needed climbing 3-5 steps with a railing? : A Lot 6 Click Score: 17    End of Session Equipment Utilized During Treatment: Oxygen Activity Tolerance: Patient limited by fatigue;Treatment limited secondary to medical complications (Comment)(dyspnea) Patient left: in bed;with call bell/phone within reach;with family/visitor present Nurse Communication: Mobility status PT Visit Diagnosis: Other abnormalities of gait and mobility (R26.89);Difficulty in walking, not elsewhere classified (R26.2)    Time: 8527-7824 PT Time Calculation (min) (ACUTE ONLY): 18 min   Charges:   PT Evaluation $PT Eval Low Complexity: 1 Low         Julien Girt, PT Acute Rehabilitation Services Pager [phone]  Office [phone]   [name] 10/21/2018, 4:49 PM

## 2018-10-21 NOTE — Progress Notes (Signed)
  Echocardiogram 2D Echocardiogram has been performed.  Matilde Bash 10/21/2018, 12:12 PM

## 2018-10-21 NOTE — Progress Notes (Signed)
Patient reports her breathing is normal and she is currently on baseline O2 requirement; she says her urine output is lower than her normal also. RT will continue to monitor patient.

## 2018-10-21 NOTE — H&P (Signed)
History and Physical  Morgan Fischer IWP:[id number] DOB: [birth year] DOA: 10/20/2018   Patient coming from: [place] ED  Chief Complaint: Shortness of breath  HPI: Morgan Fischer is a 68 y.o. 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  Patient reports worsening shortness of breath despite trying all her home nebulizers/inhalers without improvement.  Patient denies any fever/chills, cough, chest pain, abdominal pain, nausea/vomiting, diarrhea.   ED Course: Patient was noted to be febrile, tachypneic to the low 30s, now requiring about 4 L of O2, hypertensive, labs show no leukocytosis, BNP 671, troponin 18, EKG with ST changes, with prolongated QTC, chest x-ray with mild atelectasis on the right base with no overt edema.  COVID-19 negative.  Patient was treated with IV Lasix and Solu-Medrol.  Patient transferred to [facility] long for admission for further management.  Review of Systems: Review of systems are otherwise negative   Past Medical History:  Diagnosis Date  . Arthritis   . Asthma   . CHF (congestive heart failure) ([place])   . COPD (chronic obstructive pulmonary disease) ([place])   . Diabetes mellitus without complication ([place])   . High cholesterol   . Hypertension   . Morbid obesity ([place])    Past Surgical History:  Procedure Laterality Date  . ABDOMINAL HYSTERECTOMY    . CESAREAN SECTION      Social History:  reports that she has never smoked. She has never used smokeless tobacco. She reports that she does not drink alcohol or use drugs.   Allergies  Allergen Reactions  . Atorvastatin Swelling    Lip swelling  . Ciprocinonide [Fluocinolone] Other (See Comments)    Kidney failure  . Catapres [Clonidine Hcl] Rash  . Demadex [Torsemide] Rash    Family History   Problem Relation Age of Onset  . Diabetes Mellitus I Mother   . CAD Mother   . Diabetes Mother   . Heart disease Father   . Cancer Father   . Breast cancer Sister   . Cancer Sister       Prior to Admission medications   Medication Sig Start Date End Date Taking? Authorizing Provider  ACCU-CHEK AVIVA PLUS test strip Inject 1 applicator into the skin as needed. 07/20/15   [provider]  allopurinol (ZYLOPRIM) 300 MG tablet Take 300 mg by mouth daily.    [provider]  amLODipine (NORVASC) 5 MG tablet Take 1 tablet by mouth daily. 08/14/16   Denton Brick., MD  aspirin 325 MG EC tablet Take 325 mg by mouth daily.    [provider]  atenolol (TENORMIN) 100 MG tablet Take 1 tablet (100 mg total) by mouth daily. 08/28/15   Thurnell Lose, MD  Calcium Carb-Cholecalciferol (CALCIUM-VITAMIN D3) 500-400 MG-UNIT TABS Take 1 tablet by mouth 2 (two) times daily.    [provider]  Cholecalciferol (VITAMIN D3) 2000 units capsule Take 1 capsule by mouth daily.    Bonsu, Osei A, DO  Cyanocobalamin (VITAMIN B-12 PO) Take 1 tablet by mouth daily.    [provider]  Dulaglutide (TRULICITY) 5.05 LZ/7.6BH SOPN Inject 1 application into the skin once a week.     [provider]  Fluticasone-Salmeterol (ADVAIR) 250-50 MCG/DOSE AEPB Inhale 1 puff into the lungs 2 (two) times daily.    [provider]  furosemide (LASIX)  40 MG tablet Take 1 tablet (40 mg total) by mouth 2 (two) times daily. 08/28/15   Leroy Sea, MD  Garlic 1000 MG CAPS Take 1,000 mg by mouth daily.    [provider]  glimepiride (AMARYL) 4 MG tablet Take 4 mg by mouth daily.     [provider]  hydrALAZINE (APRESOLINE) 25 MG tablet Take 25 mg by mouth every 8 (eight) hours. 07/25/16 08/24/16  [provider]  hydrOXYzine (ATARAX/VISTARIL) 10 MG tablet Take 10 mg by mouth 3 (three) times daily as needed.    [provider]   ibuprofen (ADVIL,MOTRIN) 200 MG tablet Take 3-4 tablets as needed for pain (patient takes about 4 tablets per week max)    [provider]  ipratropium-albuterol (DUONEB) 0.5-2.5 (3) MG/3ML SOLN Use 1 vial in nebulizer every 4 hours as needed    [place], Deniece Portela, MD  LANTUS SOLOSTAR 100 UNIT/ML Solostar Pen Inject 15 Units into the skin daily.  06/07/15   Bonsu, Osei A, DO  losartan (COZAAR) 100 MG tablet Take 1 tablet by mouth daily.    Bonsu, Osei A, DO  montelukast (SINGULAIR) 10 MG tablet Take 10 mg by mouth at bedtime.    [provider]  Multiple Vitamin (MULTIVITAMIN WITH MINERALS) TABS tablet Take 1 tablet by mouth daily.    [provider]  nitroGLYCERIN (NITROSTAT) 0.4 MG SL tablet Place 1 tablet (0.4 mg total) under the tongue every 5 (five) minutes as needed for chest pain. 08/28/15   Leroy Sea, MD  Omega-3 Fatty Acids (FISH OIL) 1000 MG CAPS Take 1,000 mg by mouth 2 (two) times daily.     [provider]  potassium chloride (K-DUR,KLOR-CON) 10 MEQ tablet Take 20 mEq by mouth once.    [provider]  rosuvastatin (CRESTOR) 40 MG tablet Take 40 mg by mouth daily.    [provider]  Vitamins/Minerals TABS Take 1 tablet by mouth daily.    [provider]    Physical Exam: BP (!) 166/68   Pulse 63   Temp 98.7 F (37.1 C)   Resp (!) 21   Ht 5' 3.5" (1.613 m)   Wt 132 kg   SpO2 97%   BMI 50.74 kg/m   General: Mild distress, tachypneic, morbidly obese Eyes: Normal ENT: Normal Neck: Supple Cardiovascular: S1, S2 present Respiratory: Decreased breath sounds noted bilaterally Abdomen: Soft, nontender, nondistended, bowel sounds present Skin: Chronic venous stasis skin changes noted on BLE Musculoskeletal: Trace bilateral pedal edema noted Psychiatric: Normal mood Neurologic: No obvious focal neurologic deficit noted          Labs on Admission:  Basic Metabolic Panel: Recent Labs  Lab 10/20/18 1828  NA  144  K 3.6  CL 99  CO2 33*  GLUCOSE 226*  BUN 21  CREATININE 0.67  CALCIUM 9.2   Liver Function Tests: No results for input(s): AST, ALT, ALKPHOS, BILITOT, PROT, ALBUMIN in the last 168 hours. No results for input(s): LIPASE, AMYLASE in the last 168 hours. No results for input(s): AMMONIA in the last 168 hours. CBC: Recent Labs  Lab 10/20/18 1828  WBC 7.2  NEUTROABS 4.5  HGB 11.3*  HCT 38.8  MCV 92.2  PLT 149*   Cardiac Enzymes: No results for input(s): CKTOTAL, CKMB, CKMBINDEX, TROPONINI in the last 168 hours.  BNP (last 3 results) Recent Labs    10/20/18 1828  BNP 671.3*    ProBNP (last 3 results) No results for input(s): PROBNP  in the last 8760 hours.  CBG: Recent Labs  Lab 10/21/18 0047  GLUCAP 226*    Radiological Exams on Admission: Dg Chest Port 1 View  Result Date: 10/20/2018 CLINICAL DATA:  Increased shortness of breath. EXAM: PORTABLE CHEST 1 VIEW COMPARISON:  Multiple chest x-rays since October 2009. FINDINGS: Fullness in the right hilum is identified. This is more prominent compared to previous studies but was similar when compared to November 2018. Cardiomegaly. Left hilum is largely obscured due to patient rotation. No pneumothorax. No overt edema or focal infiltrate. IMPRESSION: 1. Increased prominence of the right hilum is probably due to patient rotation. A CT scan from July 2018 demonstrated an enlarged pulmonary artery consistent with pulmonary arterial hypertension. 2. Mild atelectasis in the right base.  No overt edema. 3. No other acute abnormalities. Electronically Signed   By: Gerome Samavid  Williams III M.D   On: 10/20/2018 18:49    EKG: Independently reviewed.  No acute ST changes noted  Assessment/Plan Present on Admission: . Acute on chronic respiratory failure with hypoxia (HCC) . Morbid obesity due to excess calories (HCC) . Chronic diastolic heart failure (HCC) . Hypertensive heart disease with heart failure (HCC) . Chronic obstructive  pulmonary disease with acute exacerbation (HCC) . Hyperglycemia due to type 2 diabetes mellitus (HCC)  Principal Problem:   Acute on chronic respiratory failure with hypoxia (HCC) Active Problems:   Morbid obesity due to excess calories (HCC)   Chronic diastolic heart failure (HCC)   Hypertensive heart disease with heart failure (HCC)   Chronic obstructive pulmonary disease with acute exacerbation (HCC)   Hyperglycemia due to type 2 diabetes mellitus (HCC)   Acute on chronic respiratory failure with hypoxia and hypercapnia likely 2/2 acute decompensated diastolic HF Vs acute exacerbation of COPD/OHS, rule out PE, ACS Tachypneic, requiring more oxygen about 4 L above her baseline of 2 L, worsening LE edema on admission Afebrile, with no leukocytosis BNP elevated 671.3 D-dimer negative COVID-19 negative Troponin 18-->14 EKG with ST changes, with prolongated QTC Chest x-ray with mild atelectasis on the right base with no overt edema. Telemetry, monitor closely  ?Acute exacerbation of COPD/OHS Chest x-ray as above Patient with QTC prolongation, will use doxycycline for COPD exacerbation Duo nebs, inhalers, IV Solu-Medrol Supplemental oxygen  ?Acute on chronic diastolic HF BNP 671 Chest x-ray with mild atelectasis on the right base with no overt edema. Echo done on 08/2015 showed EF of 80%, grade 2 diastolic dysfunction although difficult study with reduced windows due to body habitus Repeat echo pending IV Lasix, hold home p.o. Lasix Strict I's and O's, daily weights Telemetry  Diabetes mellitus type 2 Last A1c in 08/2015 was 10, repeat pending Lantus, SSI, Accu-Cheks, hypoglycemic protocol Hold home Trulicity, glimepiride  Hypertension Uncontrolled Continue home amlodipine, losartan, atenolol  Morbid obesity Lifestyle modification advised        DVT prophylaxis: Lovenox  Code Status: Full  Family Communication: None at bedside  Disposition Plan: To be  determined  Consults called: None  Admission status: Observation    Briant CedarNkeiruka J  MD [facility]   If 7PM-7AM, please contact night-coverage [email or web address]   10/21/2018, 10:01 AM

## 2018-10-21 NOTE — ED Notes (Signed)
Pt c/o arthritic R knee pain, is requesting Tylenol. EDP notified, V/O given.

## 2018-10-21 NOTE — Progress Notes (Signed)
Placed patient on BiPAP are her home settings 22 IPAP and 14 EPAP with a full face mask and  30% FIO2.  Patient tolerating well.

## 2018-10-21 NOTE — Progress Notes (Signed)
Patient arrived to unit alert and oriented. Placed on 2 L of oxygen. Oriented to room and equipment. Bed locked and in lowest position. Will continue to monitor.

## 2018-10-21 NOTE — Progress Notes (Addendum)
Placed PT on Bi Level per home settings (IPAP 22cm / EPAP 14cm) with 2 lpm 02 bleed in. PT tolerating well at this time- PT states she is receiving enough air on inspiration and able to exhale fully on exhalation.

## 2018-10-22 DIAGNOSIS — I421 Obstructive hypertrophic cardiomyopathy: Secondary | ICD-10-CM

## 2018-10-22 DIAGNOSIS — J9621 Acute and chronic respiratory failure with hypoxia: Secondary | ICD-10-CM

## 2018-10-22 DIAGNOSIS — I110 Hypertensive heart disease with heart failure: Principal | ICD-10-CM

## 2018-10-22 DIAGNOSIS — I10 Essential (primary) hypertension: Secondary | ICD-10-CM

## 2018-10-22 LAB — CBC
HCT: 32.9 % — ABNORMAL LOW (ref 36.0–46.0)
Hemoglobin: 9.7 g/dL — ABNORMAL LOW (ref 12.0–15.0)
MCH: 26.9 pg (ref 26.0–34.0)
MCHC: 29.5 g/dL — ABNORMAL LOW (ref 30.0–36.0)
MCV: 91.1 fL (ref 80.0–100.0)
Platelets: 122 10*3/uL — ABNORMAL LOW (ref 150–400)
RBC: 3.61 MIL/uL — ABNORMAL LOW (ref 3.87–5.11)
RDW: 15.4 % (ref 11.5–15.5)
WBC: 9.3 10*3/uL (ref 4.0–10.5)
nRBC: 0 % (ref 0.0–0.2)

## 2018-10-22 LAB — MAGNESIUM: Magnesium: 2.1 mg/dL (ref 1.7–2.4)

## 2018-10-22 LAB — BASIC METABOLIC PANEL
Anion gap: 10 (ref 5–15)
BUN: 27 mg/dL — ABNORMAL HIGH (ref 8–23)
CO2: 34 mmol/L — ABNORMAL HIGH (ref 22–32)
Calcium: 9 mg/dL (ref 8.9–10.3)
Chloride: 98 mmol/L (ref 98–111)
Creatinine, Ser: 0.85 mg/dL (ref 0.44–1.00)
GFR calc Af Amer: >60 mL/min (ref >60)
GFR calc non Af Amer: >60 mL/min (ref >60)
Glucose, Bld: 241 mg/dL — ABNORMAL HIGH (ref 70–99)
Potassium: 3.7 mmol/L (ref 3.5–5.1)
Sodium: 142 mmol/L (ref 135–145)

## 2018-10-22 LAB — HEMOGLOBIN A1C
Hgb A1c MFr Bld: 7.9 % — ABNORMAL HIGH (ref 4.8–5.6)
Mean Plasma Glucose: 180.03 mg/dL

## 2018-10-22 LAB — GLUCOSE, CAPILLARY
Glucose-Capillary: 215 mg/dL — ABNORMAL HIGH (ref 70–99)
Glucose-Capillary: 251 mg/dL — ABNORMAL HIGH (ref 70–99)
Glucose-Capillary: 256 mg/dL — ABNORMAL HIGH (ref 70–99)
Glucose-Capillary: 197 mg/dL — ABNORMAL HIGH (ref 70–99)

## 2018-10-22 MED ORDER — INSULIN ASPART 100 UNIT/ML ~~LOC~~ SOLN
0.0000 [IU] | Freq: Three times a day (TID) | SUBCUTANEOUS | Status: DC
  Administered 2018-10-22 (×2): 11 [IU] via SUBCUTANEOUS
  Administered 2018-10-23: 4 [IU] via SUBCUTANEOUS
  Administered 2018-10-23: 3 [IU] via SUBCUTANEOUS
  Administered 2018-10-23 – 2018-10-24 (×3): 4 [IU] via SUBCUTANEOUS

## 2018-10-22 MED ORDER — INSULIN ASPART 100 UNIT/ML ~~LOC~~ SOLN
6.0000 [IU] | Freq: Three times a day (TID) | SUBCUTANEOUS | Status: DC
  Administered 2018-10-22 – 2018-10-24 (×8): 6 [IU] via SUBCUTANEOUS

## 2018-10-22 MED ORDER — FUROSEMIDE 10 MG/ML IJ SOLN
80.0000 mg | Freq: Two times a day (BID) | INTRAMUSCULAR | Status: DC
  Administered 2018-10-22 – 2018-10-24 (×4): 80 mg via INTRAVENOUS
  Filled 2018-10-22 (×4): qty 8

## 2018-10-22 MED ORDER — FUROSEMIDE 10 MG/ML IJ SOLN
40.0000 mg | Freq: Once | INTRAMUSCULAR | Status: AC
  Administered 2018-10-22: 40 mg via INTRAVENOUS
  Filled 2018-10-22: qty 4

## 2018-10-22 MED ORDER — ASPIRIN EC 81 MG PO TBEC: 81.0000 mg | DELAYED_RELEASE_TABLET | Freq: Every day | ORAL | Status: DC

## 2018-10-22 MED ORDER — ASPIRIN EC 81 MG PO TBEC
81.0000 mg | DELAYED_RELEASE_TABLET | Freq: Every day | ORAL | Status: DC
  Administered 2018-10-22 – 2018-10-24 (×3): 81 mg via ORAL
  Filled 2018-10-22 (×3): qty 1

## 2018-10-22 MED ORDER — INSULIN ASPART 100 UNIT/ML ~~LOC~~ SOLN: 0.0000 [IU] | Freq: Every day | SUBCUTANEOUS | Status: DC

## 2018-10-22 NOTE — Evaluation (Signed)
Occupational Therapy Evaluation Patient Details Name: Morgan Fischer MRN: [id number] DOB: Apr 20, 1950 Today's Date: 10/22/2018    History of Present Illness 68 yo female admitted to ED on 10/18 with acute on chronic respiratory failure. PMH includes COPD on 2LO2 chronically, HF, DM, HLD, HTN, morbid obesity.   Clinical Impression   Pt admitted with respiratory failure. Pt currently with functional limitations due to the deficits listed below (see OT Problem List).  Pt will benefit from skilled OT to increase their safety and independence with ADL and functional mobility for ADL to facilitate discharge to venue listed below.   Pt has an aide at home as well as lives with daughter ( she works).  Pt was mod I with simple ADL activity prior to a few monthes ago and hopes to return to mod level     Follow Up Recommendations  Home health OT;Supervision/Assistance - 24 hour    Equipment Recommendations  None recommended by OT    Recommendations for Other Services       Precautions / Restrictions Precautions Precautions: Fall Precaution Comments: watch sats Restrictions Weight Bearing Restrictions: No      Mobility Bed Mobility Overal bed mobility: Needs Assistance Bed Mobility: Sit to Supine       Sit to supine: Min assist      Transfers Overall transfer level: Needs assistance Equipment used: None;Rolling walker (2 wheeled) Transfers: Sit to/from [facility] Sit to Stand: Min guard;From elevated surface         General transfer comment: Min guard for safety, increased time to rise with use of UEs to push up.    Balance Overall balance assessment: Mild deficits observed, not formally tested                                         ADL either performed or assessed with clinical judgement   ADL Overall ADL's : Needs assistance/impaired Eating/Feeding: Set up;Sitting   Grooming: Set up;Sitting   Upper Body Bathing: Set up;Sitting   Lower Body Bathing: Moderate assistance;Sit to/from stand;Cueing for safety;Cueing for compensatory techniques   Upper Body Dressing : Set up;Sitting   Lower Body Dressing: Moderate assistance;Sit to/from stand;Cueing for safety;Cueing for sequencing   Toilet Transfer: Minimal assistance;BSC;Stand-pivot;Cueing for safety   Toileting- Clothing Manipulation and Hygiene: Minimal assistance;Sit to/from stand;Cueing for compensatory techniques;Cueing for safety;Cueing for sequencing         General ADL Comments: pt has an aide and a daugther that will A as needed     Vision Baseline Vision/History: No visual deficits              Pertinent Vitals/Pain Pain Assessment: No/denies pain     Hand Dominance Right   Extremity/Trunk Assessment         Cervical / Trunk Assessment Cervical / Trunk Assessment: Normal   Communication Communication Communication: No difficulties   Cognition Arousal/Alertness: Awake/alert Behavior During Therapy: WFL for tasks assessed/performed Overall Cognitive Status: Within Functional Limits for tasks assessed                                                Home Living Family/patient expects to be discharged to:: Private residence Living Arrangements: Children Available Help at Discharge: Family;Available PRN/intermittently;Personal care attendant(pt's caregiver comes  in tues-friday, 9-1) Type of Home: House Home Access: Ramped entrance;Stairs to enter Entrance Stairs-Number of Steps: 4-5   Home Layout: One level     Bathroom Shower/Tub: Producer, television/film/video: Handicapped height(comfort height) Bathroom Accessibility: Yes   Home Equipment: Cane - single point          Prior Functioning/Environment Level of Independence: Independent with assistive device(s)        Comments: uses cane to ambulate        OT Problem List: Decreased strength;Decreased activity tolerance;Impaired balance (sitting  and/or standing);Decreased knowledge of use of DME or AE      OT Treatment/Interventions: Self-care/ADL training;Patient/family education;DME and/or AE instruction;Energy conservation    OT Goals(Current goals can be found in the care plan section) Acute Rehab OT Goals Patient Stated Goal: breathe better OT Goal Formulation: With patient Time For Goal Achievement: 10/29/18 Potential to Achieve Goals: Good  OT Frequency: Min 2X/week   Barriers to D/C:               AM-PAC OT "6 Clicks" Daily Activity     Outcome Measure Help from another person eating meals?: None Help from another person taking care of personal grooming?: A Little Help from another person toileting, which includes using toliet, bedpan, or urinal?: A Little Help from another person bathing (including washing, rinsing, drying)?: A Little Help from another person to put on and taking off regular upper body clothing?: A Little Help from another person to put on and taking off regular lower body clothing?: A Lot 6 Click Score: 18   End of Session Equipment Utilized During Treatment: Rolling walker Nurse Communication: Mobility status  Activity Tolerance: Patient tolerated treatment well Patient left: in chair  OT Visit Diagnosis: Unsteadiness on feet (R26.81);Muscle weakness (generalized) (M62.81);Other abnormalities of gait and mobility (R26.89)                  Charges:  OT General Charges $OT Visit: 1 Visit OT Evaluation $OT Eval Moderate Complexity: 1 Mod  Lise Auer, OT Acute Rehabilitation Services [phone] Office- [phone]     Melvena Vink, Karin Golden D 10/22/2018, 4:15 PM

## 2018-10-22 NOTE — Progress Notes (Signed)
PROGRESS NOTE    Morgan Fischer  QMV:[id number] DOB: 05/14/1950 DOA: 10/20/2018 PCP: Morgan Plumsei-Bonsu, George, MD   Brief Narrative:  68 year old with history of asthma/COPD, chronic hypoxia on 2 L nasal cannula, CHF,  diabetes mellitus type 2, essential hypertension, hyperlipidemia, morbid obesity presented with shortness of breath and lower extremity swelling.  Upon admission she was noted to be tachypneic with BNP of 600.  EKG showing ST-T changes, prolonged QTC.  Chest x-ray was consistent with mild atelectasis of the right base.  COVID-19-negative.   Assessment & Plan:   Principal Problem:   Acute on chronic respiratory failure with hypoxia (HCC) Active Problems:   Morbid obesity due to excess calories (HCC)   Chronic diastolic heart failure (HCC)   Hypertensive heart disease with heart failure (HCC)   Chronic obstructive pulmonary disease with acute exacerbation (HCC)   Hyperglycemia due to type 2 diabetes mellitus (HCC)  Acute on chronic hypoxic respiratory distress, multifactorial -Shortness of breath is combination of fluid overload secondary to diastolic CHF and COPD exacerbation. -COVID-19-negative.  D-dimer-negative -Incentive spirometer to help with atelectasis  Acute diastolic congestive heart failure with ejection fraction 60%, grade 2 diastolic dysfunction, class III Hypertrophic obstructive cardiomyopathy -proBNP falsely low due to her body habitus. -Echocardiogram 10/21/2018-ejection fraction 60 to 65%, severe LVH with concerns for HOCM.  Mild mitral aortic valve stenosis.  Moderate elevated pulmonary arterial systolic pressures. -Difficult to assess volume status but will give her IV Lasix to bring her down to euvolemic or slightly drier side.  Monitor urine output. -Daily weights -Aggressive management of blood pressure -Consult cardiology for appropriate med management -Closely monitor electrolytes  Acute mild COPD exacerbation with possible underlying bronchitis  -Bronchodilators.  Discontinue Solu-Medrol -Doxycycline day 2  Diabetes mellitus type 2, insulin-dependent -Lantus 15 units daily.  Insulin sliding scale Accu-Cheks -Premeal insulin ordered -Hemoglobin A1c 7.9  Essential hypertension -Norvasc 5 mg daily, atenolol 100 mg daily, losartan 100 mg daily  Hyperlipidemia -Crestor 40 mg at bedtime  Morbid obesity with BMI greater than 50. -Needs weight loss diet and exercise.  Dietitian consulted  Apparently her previous to sleep study has been negative outpatient  DVT prophylaxis: Lovenox Code Status: Full Family Communication: None at bedside Disposition Plan: Maintain hospital stay for IV diuretics  Consultants:   Cardiology  Procedures:   None  Antimicrobials:   Doxycycline day 2   Subjective: Overall tells me she feels better compared to yesterday but still has mild exertional dyspnea.  Somewhat poor response to Lasix documented per chart.  She lives at home with her daughter and ambulates with the use of walker or cane.  Unable to walk or stand for long distances in long time.  Review of Systems Otherwise negative except as per HPI, including: General: Denies fever, chills, night sweats or unintended weight loss. Resp: Denies cough, wheezing Cardiac: Denies chest pain, palpitations, orthopnea, paroxysmal nocturnal dyspnea. GI: Denies abdominal pain, nausea, vomiting, diarrhea or constipation GU: Denies dysuria, frequency, hesitancy or incontinence MS: Denies muscle aches, joint pain or swelling Neuro: Denies headache, neurologic deficits (focal weakness, numbness, tingling), abnormal gait Psych: Denies anxiety, depression, SI/HI/AVH Skin: Denies new rashes or lesions ID: Denies sick contacts, exotic exposures, travel  Objective: Vitals:   10/21/18 2002 10/22/18 0500 10/22/18 0513 10/22/18 0747  BP:   133/63   Pulse: 63  60   Resp: 18  16   Temp:   (!) 97.5 F (36.4 C)   TempSrc:   Oral   SpO2: 99%  99%  98%  Weight:  133.4 kg    Height:        Intake/Output Summary (Last 24 hours) at 10/22/2018 2992 Last data filed at 10/21/2018 1822 Gross per 24 hour  Intake 240 ml  Output 450 ml  Net -210 ml   Filed Weights   10/20/18 1806 10/22/18 0500  Weight: 132 kg 133.4 kg    Examination:  General exam: Appears calm and comfortable, morbidly obese Respiratory system: Normal bibasilar crackles Cardiovascular system: S1 & S2 heard, RRR. No JVD, murmurs, rubs, gallops or clicks.  Trace bilateral lower extremity pitting edema Gastrointestinal system: Abdomen is nondistended, soft and nontender. No organomegaly or masses felt. Normal bowel sounds heard. Central nervous system: Alert and oriented. No focal neurological deficits. Extremities: Symmetric 5 x 5 power. Skin: No rashes, lesions or ulcers Psychiatry: Judgement and insight appear normal. Mood & affect appropriate.     Data Reviewed:   CBC: Recent Labs  Lab 10/20/18 1828 10/22/18 0519  WBC 7.2 9.3  NEUTROABS 4.5  --   HGB 11.3* 9.7*  HCT 38.8 32.9*  MCV 92.2 91.1  PLT 149* 122*   Basic Metabolic Panel: Recent Labs  Lab 10/20/18 1828 10/22/18 0519  NA 144 142  K 3.6 3.7  CL 99 98  CO2 33* 34*  GLUCOSE 226* 241*  BUN 21 27*  CREATININE 0.67 0.85  CALCIUM 9.2 9.0   GFR: Estimated Creatinine Clearance: 84.3 mL/min (by C-G formula based on SCr of 0.85 mg/dL). Liver Function Tests: No results for input(s): AST, ALT, ALKPHOS, BILITOT, PROT, ALBUMIN in the last 168 hours. No results for input(s): LIPASE, AMYLASE in the last 168 hours. No results for input(s): AMMONIA in the last 168 hours. Coagulation Profile: No results for input(s): INR, PROTIME in the last 168 hours. Cardiac Enzymes: No results for input(s): CKTOTAL, CKMB, CKMBINDEX, TROPONINI in the last 168 hours. BNP (last 3 results) No results for input(s): PROBNP in the last 8760 hours. HbA1C: No results for input(s): HGBA1C in the last 72 hours. CBG:  Recent Labs  Lab 10/21/18 0047 10/21/18 1243 10/21/18 1700 10/21/18 2003 10/22/18 0746  GLUCAP 226* 236* 281* 294* 215*   Lipid Profile: No results for input(s): CHOL, HDL, LDLCALC, TRIG, CHOLHDL, LDLDIRECT in the last 72 hours. Thyroid Function Tests: No results for input(s): TSH, T4TOTAL, FREET4, T3FREE, THYROIDAB in the last 72 hours. Anemia Panel: No results for input(s): VITAMINB12, FOLATE, FERRITIN, TIBC, IRON, RETICCTPCT in the last 72 hours. Sepsis Labs: No results for input(s): PROCALCITON, LATICACIDVEN in the last 168 hours.  Recent Results (from the past 240 hour(s))  SARS Coronavirus 2 by RT PCR (hospital order, performed in [facility] hospital lab) Nasopharyngeal Nasopharyngeal Swab     Status: None   Collection Time: 10/20/18  6:28 PM   Specimen: Nasopharyngeal Swab  Result Value Ref Range Status   SARS Coronavirus 2 NEGATIVE NEGATIVE Final    Comment: (NOTE) If result is NEGATIVE SARS-CoV-2 target nucleic acids are NOT DETECTED. The SARS-CoV-2 RNA is generally detectable in upper and lower  respiratory specimens during the acute phase of infection. The lowest  concentration of SARS-CoV-2 viral copies this assay can detect is 250  copies / mL. A negative result does not preclude SARS-CoV-2 infection  and should not be used as the sole basis for treatment or other  patient management decisions.  A negative result may occur with  improper specimen collection / handling, submission of specimen other  than nasopharyngeal swab, presence of viral mutation(s)  within the  areas targeted by this assay, and inadequate number of viral copies  (<250 copies / mL). A negative result must be combined with clinical  observations, patient history, and epidemiological information. If result is POSITIVE SARS-CoV-2 target nucleic acids are DETECTED. The SARS-CoV-2 RNA is generally detectable in upper and lower  respiratory specimens dur ing the acute phase of infection.   Positive  results are indicative of active infection with SARS-CoV-2.  Clinical  correlation with patient history and other diagnostic information is  necessary to determine patient infection status.  Positive results do  not rule out bacterial infection or co-infection with other viruses. If result is PRESUMPTIVE POSTIVE SARS-CoV-2 nucleic acids MAY BE PRESENT.   A presumptive positive result was obtained on the submitted specimen  and confirmed on repeat testing.  While 2019 novel coronavirus  (SARS-CoV-2) nucleic acids may be present in the submitted sample  additional confirmatory testing may be necessary for epidemiological  and / or clinical management purposes  to differentiate between  SARS-CoV-2 and other Sarbecovirus currently known to infect humans.  If clinically indicated additional testing with an alternate test  methodology [phone]) is advised. The SARS-CoV-2 RNA is generally  detectable in upper and lower respiratory sp ecimens during the acute  phase of infection. The expected result is Negative. Fact Sheet for Patients:  StrictlyIdeas.no Fact Sheet for Healthcare Providers: BankingDealers.co.za This test is not yet approved or cleared by the [place] FDA and has been authorized for detection and/or diagnosis of SARS-CoV-2 by FDA under an Emergency Use Authorization (EUA).  This EUA will remain in effect (meaning this test can be used) for the duration of the COVID-19 declaration under Section 564(b)(1) of the Act, 21 U.S.C. section 360bbb-3(b)(1), unless the authorization is terminated or revoked sooner. Performed at [facility], [address], [place], [place] [address]          Radiology Studies: Dg Chest [place] 1 View  Result Date: 10/20/2018 CLINICAL DATA:  Increased shortness of breath. EXAM: PORTABLE CHEST 1 VIEW COMPARISON:  Multiple chest x-rays since October 2009. FINDINGS: Fullness in  the right hilum is identified. This is more prominent compared to previous studies but was similar when compared to November 2018. Cardiomegaly. Left hilum is largely obscured due to patient rotation. No pneumothorax. No overt edema or focal infiltrate. IMPRESSION: 1. Increased prominence of the right hilum is probably due to patient rotation. A CT scan from July 2018 demonstrated an enlarged pulmonary artery consistent with pulmonary arterial hypertension. 2. Mild atelectasis in the right base.  No overt edema. 3. No other acute abnormalities. Electronically Signed   By: Dorise Bullion III M.D   On: 10/20/2018 18:49        Scheduled Meds: . allopurinol  300 mg Oral Daily  . amLODipine  5 mg Oral Daily  . aspirin  325 mg Oral Daily  . atenolol  100 mg Oral Daily  . calcium-vitamin D  1 tablet Oral BID  . doxycycline  100 mg Oral Q12H  . enoxaparin (LOVENOX) injection  60 mg Subcutaneous Q24H  . furosemide  40 mg Intravenous BID  . influenza vaccine adjuvanted  0.5 mL Intramuscular Tomorrow-1000  . insulin aspart  0-15 Units Subcutaneous TID WC  . insulin glargine  15 Units Subcutaneous Daily  . ipratropium-albuterol  3 mL Nebulization TID  . losartan  100 mg Oral Daily  . mouth rinse  15 mL Mouth Rinse BID  . methylPREDNISolone (SOLU-MEDROL) injection  60 mg Intravenous Q12H  . mometasone-formoterol  2 puff Inhalation BID  . montelukast  10 mg Oral QHS  . rosuvastatin  40 mg Oral QHS   Continuous Infusions:   LOS: 1 day   Time spent= 28 mins    Ankit Joline Maxcy, MD [facility]  If 7PM-7AM, please contact night-coverage  10/22/2018, 8:22 AM

## 2018-10-22 NOTE — Consult Note (Addendum)
Cardiology Consultation:   Patient ID: Morgan Fischer MRN: [id number]; DOB: 1950/03/31  Admit date: 10/20/2018 Date of Consult: 10/22/2018  Primary Care Provider: Benito Mccreedy, MD Primary Cardiologist: No primary care provider on file.  Primary Electrophysiologist:  None    Patient Profile:   Morgan Fischer is a 68 y.o. female with a history of CAD noted on chest CT in 5638, chronic diastolic CHF/ HOCM, moderate aortic stenosis, COPD and asthma on 2 L of home O2, hypertension, hyperlipidemia, type 2 diabetes mellitus, and morbid obesity with BMI of 51.28 who is being seen today for the evaluation of acute on chronic diastolic CHF at the request of Dr. Reesa Chew.  History of Present Illness:   Morgan Fischer is a 68 year old female with the above history who is followed by Dr. Marijo File in [facility] for cardiac care. Per reviewed in care every, patient had Myoview in 10/2014 was non-conclusive due to body habitus and showed decreased perfusion involving both the anterior and lateral wall of the LV attributable to attenuation vs mild ischemia. Echo in 12/2016 showed hyperdynamic LV systolic function with market concentric LVH with mid cavity gradient and SAM of chordae noted. She was also noted to have mild coronary artery calcification on chest CT in 07/2016. She was last seen by Dr. Marijo File in 09/2017 at which time she reported chronic shorttess of breath had improved but remained on O2 and she noted continued mild pedal edema but denied any other cardiac symptoms. Dr. Marijo File felt her most recent Echo demonstrated more of a hypertrophic outflow obstruction and a fixed aortic valve lesion. Since she was doing well symptomatically, no medication changes were made but he did not that if she becomes more symptomatic her would recommend switching Amlodipine to Verapamil. Current home medications include Lasix 40mg  twice daily, Hydralazine 25mg  three times daily, Aspirin 325mg  daily, Atenolol 100mg  daily,  Losartan 100mg  daily, and Crestor 40mg  daily.   Patient presented to the [facility] ED on 10/20/2018 for evaluation of increased shortness of breath.  Patient reports mild shortness of breath over the last 3 to 4 days.  Patient states she was on her way home from [place] on day of presentation when she had sudden worsening of her breathing with pursed lip breathing.  Therefore, she decided to come to the ED for further evaluation.  She has chronic lower extremity swelling that is worse throughout the day and improves by morning.  She states her lower extremity swelling has actually been better than usual lately.  She is stable two-pillow orthopnea which she has had for years we will PND.  She is on 2 L of O2 at home all the time and uses BiPAP at night.  She denies any chest pain.  She reports occasional fluttering sensation in her chest but denies any lightheadedness, dizziness, syncope.  No abnormal bleeding.  Upon arrival to the ED, patient was tachypneic and hypertensive. EKG showed normal sinus rhythm with T wave inversion in lead V6 but no new changes compared to prior tracings. High-sensitivity troponin borderline positive at 18 >> 14. Chest x-ray showed increased prominence of the right hilum and mild atelectasis in the right base but no overt edema. BNP elevated at 671.3. D-dimer normal. WBC 7.2, Hgb 11.3, Plts 149. Na 144, K 3.6, Glucose 226, SCr 0.67. COVID-19 negative. Patient was treated with IV Lasix and Solu-Medrol in the ED and then transferred for admission at [facility].  Currently on IV Lasix 40mg  twice daily.  Echo  yesterday showed LVEF of 60-65% with severe LVH. Chordal SAM with small LVOT graadient with severe LVH findings consistent with HOCM. Also showed moderately dilated left atrial size, trace MR, mild TR, mild AS, mild PR, and moderately elevated PASP. Cardiology was consulted for assistance with management of acute diastolic CHF.  At the time of this evaluation, patient  states she is breathing much better than yesterday and is no longer pursed lip breathing.  She did not feel like she had significant urinary output yesterday but states she has been urinating more this morning.  She only has documented 450 mL of urine over the last 24 hours.  Spoke with RN who states she does not think this is accurate but she also does not think that she has had that significant urinary response to Lasix.  Patient reports brief trial of smoking as a teenager but denies any history of tobacco use since then.  No alcohol use or recreational drug use.  Family history of heart disease with her mother recently requiring a CABG x4.  No history of sudden death in her family or genetic conditions that she is aware of.  Heart Pathway Score:     Past Medical History:  Diagnosis Date   Arthritis    Asthma    CHF (congestive heart failure) (HCC)    COPD (chronic obstructive pulmonary disease) (HCC)    Diabetes mellitus without complication (HCC)    High cholesterol    Hypertension    Morbid obesity (HCC)     Past Surgical History:  Procedure Laterality Date   ABDOMINAL HYSTERECTOMY     CESAREAN SECTION       Home Medications:  Prior to Admission medications   Medication Sig Start Date End Date Taking? Authorizing Provider  allopurinol (ZYLOPRIM) 300 MG tablet Take 300 mg by mouth daily.   Yes [provider]  aspirin 325 MG EC tablet Take 325 mg by mouth daily.   Yes [provider]  atenolol (TENORMIN) 100 MG tablet Take 1 tablet (100 mg total) by mouth daily. 08/28/15  Yes Leroy Sea, MD  Calcium Carb-Cholecalciferol (CALCIUM-VITAMIN D3) 500-400 MG-UNIT TABS Take 1 tablet by mouth 2 (two) times daily.   Yes [provider]  Cholecalciferol (VITAMIN D3) 2000 units capsule Take 1 capsule by mouth daily.   Yes Bonsu, Osei A, DO  Cyanocobalamin (VITAMIN B-12 PO) Take 1 tablet by mouth daily.   Yes [provider]    Fluticasone-Salmeterol (ADVAIR) 250-50 MCG/DOSE AEPB Inhale 1 puff into the lungs 2 (two) times daily.   Yes [provider]  furosemide (LASIX) 40 MG tablet Take 1 tablet (40 mg total) by mouth 2 (two) times daily. 08/28/15  Yes Leroy Sea, MD  Garlic 1000 MG CAPS Take 1,000 mg by mouth daily.   Yes [provider]  glimepiride (AMARYL) 4 MG tablet Take 4 mg by mouth daily.    Yes [provider]  hydrALAZINE (APRESOLINE) 25 MG tablet Take 25 mg by mouth 3 (three) times daily.  07/25/16 10/21/18 Yes [provider]  ibuprofen (ADVIL,MOTRIN) 200 MG tablet Take 3-4 tablets as needed for pain (patient takes about 4 tablets per week max)   Yes [provider]  ipratropium-albuterol (DUONEB) 0.5-2.5 (3) MG/3ML SOLN Use 1 vial in nebulizer every 4 hours as needed   Yes Beauford, Deniece Portela, MD  losartan (COZAAR) 100 MG tablet Take 1 tablet by mouth daily.   Yes Bonsu, Osei A, DO  metFORMIN (GLUCOPHAGE-XR)  500 MG 24 hr tablet Take 500 mg by mouth 2 (two) times daily. 08/29/18  Yes [provider]  montelukast (SINGULAIR) 10 MG tablet Take 10 mg by mouth at bedtime.   Yes [provider]  Multiple Vitamin (MULTIVITAMIN WITH MINERALS) TABS tablet Take 1 tablet by mouth daily.   Yes [provider]  nitroGLYCERIN (NITROSTAT) 0.4 MG SL tablet Place 1 tablet (0.4 mg total) under the tongue every 5 (five) minutes as needed for chest pain. 08/28/15  Yes Leroy SeaSingh, Prashant K, MD  Omega-3 Fatty Acids (FISH OIL) 1000 MG CAPS Take 1,000 mg by mouth 2 (two) times daily.    Yes [provider]  rosuvastatin (CRESTOR) 40 MG tablet Take 40 mg by mouth daily.   Yes [provider]  Turmeric (QC TUMERIC COMPLEX) 500 MG CAPS Take 1 capsule by mouth daily.   Yes [provider]    Inpatient Medications: Scheduled Meds:  allopurinol  300 mg Oral Daily   amLODipine  5 mg Oral Daily   aspirin  325 mg Oral Daily   atenolol   100 mg Oral Daily   calcium-vitamin D  1 tablet Oral BID   doxycycline  100 mg Oral Q12H   enoxaparin (LOVENOX) injection  60 mg Subcutaneous Q24H   furosemide  40 mg Intravenous BID   influenza vaccine adjuvanted  0.5 mL Intramuscular Tomorrow-1000   insulin aspart  0-20 Units Subcutaneous TID WC   insulin aspart  0-5 Units Subcutaneous QHS   insulin aspart  6 Units Subcutaneous TID WC   insulin glargine  15 Units Subcutaneous Daily   ipratropium-albuterol  3 mL Nebulization TID   losartan  100 mg Oral Daily   mouth rinse  15 mL Mouth Rinse BID   methylPREDNISolone (SOLU-MEDROL) injection  60 mg Intravenous Q12H   mometasone-formoterol  2 puff Inhalation BID   montelukast  10 mg Oral QHS   rosuvastatin  40 mg Oral QHS   Continuous Infusions:  PRN Meds: acetaminophen **OR** acetaminophen, albuterol, HYDROcodone-acetaminophen, ipratropium-albuterol, nitroGLYCERIN, ondansetron **OR** ondansetron (ZOFRAN) IV, senna-docusate  Allergies:    Allergies  Allergen Reactions   Atorvastatin Swelling    Lip swelling   Ciprocinonide [Fluocinolone] Other (See Comments)    Kidney failure   Catapres [Clonidine Hcl] Rash   Demadex [Torsemide] Rash    Social History:   Social History   Socioeconomic History   Marital status: Married    Spouse name: Not on file   Number of children: Not on file   Years of education: Not on file   Highest education level: Not on file  Occupational History   Not on file  Social Needs   Financial resource strain: Not on file   Food insecurity    Worry: Not on file    Inability: Not on file   Transportation needs    Medical: Not on file    Non-medical: Not on file  Tobacco Use   Smoking status: Never Smoker   Smokeless tobacco: Never Used  Substance and Sexual Activity   Alcohol use: No   Drug use: No   Sexual activity: Never  Lifestyle   Physical activity    Days per week: Not on file    Minutes per session:  Not on file   Stress: Not on file  Relationships   Social connections    Talks on phone: Not on file    Gets together: Not on file    Attends religious service: Not on file  Active member of club or organization: Not on file    Attends meetings of clubs or organizations: Not on file    Relationship status: Not on file   Intimate partner violence    Fear of current or ex partner: Not on file    Emotionally abused: Not on file    Physically abused: Not on file    Forced sexual activity: Not on file  Other Topics Concern   Not on file  Social History Narrative   Not on file    Family History:    Family History  Problem Relation Age of Onset   Diabetes Mellitus I Mother    CAD Mother    Diabetes Mother    Heart disease Father    Cancer Father    Breast cancer Sister    Cancer Sister      ROS:  Please see the history of present illness.  All other ROS reviewed and negative.     Physical Exam/Data:   Vitals:   10/21/18 2002 10/22/18 0500 10/22/18 0513 10/22/18 0747  BP:   133/63   Pulse: 63  60   Resp: 18  16   Temp:   (!) 97.5 F (36.4 C)   TempSrc:   Oral   SpO2: 99%  99% 98%  Weight:  133.4 kg    Height:        Intake/Output Summary (Last 24 hours) at 10/22/2018 0904 Last data filed at 10/21/2018 1822 Gross per 24 hour  Intake 240 ml  Output 450 ml  Net -210 ml   Last 3 Weights 10/22/2018 10/20/2018 08/03/2016  Weight (lbs) 294 lb 1.5 oz 291 lb 309 lb 3.2 oz  Weight (kg) 133.4 kg 131.997 kg 140.252 kg     Body mass index is 51.28 kg/m.  General: 68 y.o. morbidly obese African-American female resting comfortably in no acute distress. HEENT: Normocephalic and atraumatic. Sclera clear.  Neck: Supple. No carotid bruits. JVD difficult to assess due to body habitus. Heart: RRR. Distinct S1 and S2. Harsh 3/6 systolic murmur best heard at right upper sternal border. No gallops or rubs. Radial pulses 2+ and equal bilaterally. Lungs: No increased  work of breathing. Decreased breath sounds diffusely but no  wheezes, rhonchi, or rales appreciated.  Abdomen: Soft, obese, and non-tender to palpation. Bowel sounds present. MSK: Normal strength and tone for age. Extremities: Mild edema of bilateral lower extremities.    Skin: Warm and dry. Neuro: Alert and oriented x3. No focal deficits. Psych: Normal affect. Responds appropriately.  EKG:  The EKG was personally reviewed and demonstrates:  Normal sinus rhythm, 82 bpm, with T wave changes in lateral leads but no acute changes from prior tracings.  Telemetry:  Telemetry was personally reviewed and demonstrates:  Normal sinus rhythm the rates in the 50's to 70's. Multiple episodes of around 20 beat of rate controlled ventricular rhythm.   Relevant CV Studies:  Myoview 10/19/2014: Conclusion: 1. Markedly degraded examination secondary to patient body habitus and subsequent breast and chest wall attenuation. 2. Decreased perfusion involving both the anterior and lateral walls of the left ventricle, potentially attributable to attenuation though large-sized areas of mild ischemia could have a similar appearance. Clinical correlation is advised. 3. No definitive scintigraphic evidence of prior infarction. 4. Normal wall motion.  Ejection fraction - 50% _______________  Echocardiogram 12/22/2016: Summary: Marked concentric left ventricular hypertrophy with mid cavity gradient Hyperdynamic left ventricular ejection fraction Trace mitral regurgitation. Systolic anterior motion (SAM) of chordae noted  Laboratory Data:  High Sensitivity Troponin:   Recent Labs  Lab 10/20/18 1828 10/21/18 0957  TROPONINIHS 18* 14     Chemistry Recent Labs  Lab 10/20/18 1828 10/22/18 0519  NA 144 142  K 3.6 3.7  CL 99 98  CO2 33* 34*  GLUCOSE 226* 241*  BUN 21 27*  CREATININE 0.67 0.85  CALCIUM 9.2 9.0  GFRNONAA >60 >60  GFRAA >60 >60  ANIONGAP 12 10    No results for input(s): PROT,  ALBUMIN, AST, ALT, ALKPHOS, BILITOT in the last 168 hours. Hematology Recent Labs  Lab 10/20/18 1828 10/22/18 0519  WBC 7.2 9.3  RBC 4.21 3.61*  HGB 11.3* 9.7*  HCT 38.8 32.9*  MCV 92.2 91.1  MCH 26.8 26.9  MCHC 29.1* 29.5*  RDW 15.7* 15.4  PLT 149* 122*   BNP Recent Labs  Lab 10/20/18 1828  BNP 671.3*    DDimer  Recent Labs  Lab 10/21/18 0957  DDIMER 0.43     Radiology/Studies:  Dg Chest Port 1 View  Result Date: 10/20/2018 CLINICAL DATA:  Increased shortness of breath. EXAM: PORTABLE CHEST 1 VIEW COMPARISON:  Multiple chest x-rays since October 2009. FINDINGS: Fullness in the right hilum is identified. This is more prominent compared to previous studies but was similar when compared to November 2018. Cardiomegaly. Left hilum is largely obscured due to patient rotation. No pneumothorax. No overt edema or focal infiltrate. IMPRESSION: 1. Increased prominence of the right hilum is probably due to patient rotation. A CT scan from July 2018 demonstrated an enlarged pulmonary artery consistent with pulmonary arterial hypertension. 2. Mild atelectasis in the right base.  No overt edema. 3. No other acute abnormalities. Electronically Signed   By: Gerome Sam III M.D   On: 10/20/2018 18:49    Assessment and Plan:   Acute on Chronic Diastolic CHF/ HOCM - Patient presented with  - Chest x-ray showed no overt edema. - BNP elevated in the 600's. Likely higher given morbid obesity. - Echo showed LVEF of 60-65% with severe LVH. Chordal SAM with small LVOT graadient with severe LVH findings consistent with HOCM. Also showed moderately dilated left atrial size, trace MR, mild TR, mild AS, mild PR, and moderately elevated PASP. - Currently on IV Lasix  twice daily but urinary output has not been significant. Documented urinary output of 450 mL over the last 24 hours. - Very difficult to assess volume status due to body habitus. Will increase IV Lasix to  twice daily today to  see if she has more urinary output. She has already had morning dose of  so will given another dose of  now and then  this evening.  - Continue BP control. - Continue to monitor daily weights, strict I/O's, and renal function.  CAD - She had Myoview in 2016 which was non-conclusive due to body habitus but showed possible mild ischemia. Chest CT in 2018 noted coronary artery calcifications. - - EKG shows T wave changes in lateral leads but this is unchanged from 2017. - High-sensitivity troponin borderline flat at 18 >> 14. - Patient denies any angina. - Currently on Aspirin  daily which she states she has been taken for a long time. No history of stroke and see no indication for this high dose so will reduce to  daily. Continue beta-blocker and home statin.   Accelerated Idioventricular Rhythm - Per review of telemetry, patient has multiple episodes of ventricular rhythm with rates in the 50's to 60's. - Patient asymptomatic  with this. She reports occasional fluttering sensation at home that last no more than 1 day at a time but denies any of this during this hospitalization.  - Potassium 3.6.  - Will go ahead and check Magnesium.  - Continue to monitor for now.   Hypertension - Systolic BP has been as high as the 170's. BP currently well controlled at 133/63. - Home medications include Hydralazine  three times daily, Atenolol  daily, and Losartan  daily. Currently on home Atenolol and Losartan but no on Hydralazine here. On Amlodipine  daily instead. - Given HOCM, need to be cautious and not drop BP too low.   Hyperlipidemia - Continue home Crestor.  Type 2 Diabetes Mellitus - Hemoglobin A1c 7.9 this admission.  Morbid Obesity - BMI 51.28. - Discussed importance of weight loss.  - Patient asked to speak with a Nutritionist which I agree would be helpful. Will place referral.   Sleep Apnea - Compliant with BiPAP at night.   Anemia -  Hemoglobin 9.7 today, down from 11.3 yesterday.  - Patient denies any abnormal bleeding including hematochezia, melena, and hematuria. - Will defer additional work-up to primary team.    For questions or updates, please contact [facility] Please consult [email or web address] for contact info under     Signed, Corrin Parker, PA-C  10/22/2018 9:04 AM

## 2018-10-23 DIAGNOSIS — I421 Obstructive hypertrophic cardiomyopathy: Secondary | ICD-10-CM | POA: Diagnosis not present

## 2018-10-23 DIAGNOSIS — I10 Essential (primary) hypertension: Secondary | ICD-10-CM | POA: Diagnosis not present

## 2018-10-23 LAB — GLUCOSE, CAPILLARY
Glucose-Capillary: 124 mg/dL — ABNORMAL HIGH (ref 70–99)
Glucose-Capillary: 183 mg/dL — ABNORMAL HIGH (ref 70–99)
Glucose-Capillary: 198 mg/dL — ABNORMAL HIGH (ref 70–99)
Glucose-Capillary: 172 mg/dL — ABNORMAL HIGH (ref 70–99)

## 2018-10-23 LAB — BASIC METABOLIC PANEL WITH GFR
Anion gap: 11 (ref 5–15)
BUN: 34 mg/dL — ABNORMAL HIGH (ref 8–23)
CO2: 34 mmol/L — ABNORMAL HIGH (ref 22–32)
Calcium: 8.9 mg/dL (ref 8.9–10.3)
Chloride: 97 mmol/L — ABNORMAL LOW (ref 98–111)
Creatinine, Ser: 0.84 mg/dL (ref 0.44–1.00)
GFR calc Af Amer: >60 mL/min
GFR calc non Af Amer: >60 mL/min
Glucose, Bld: 121 mg/dL — ABNORMAL HIGH (ref 70–99)
Potassium: 3.4 mmol/L — ABNORMAL LOW (ref 3.5–5.1)
Sodium: 142 mmol/L (ref 135–145)

## 2018-10-23 LAB — PROCALCITONIN: Procalcitonin: <0.1 ng/mL

## 2018-10-23 MED ORDER — ROSUVASTATIN CALCIUM 20 MG PO TABS
40.0000 mg | ORAL_TABLET | Freq: Every day | ORAL | Status: DC
  Administered 2018-10-23: 40 mg via ORAL
  Filled 2018-10-23 (×2): qty 2

## 2018-10-23 MED ORDER — POTASSIUM CHLORIDE CRYS ER 20 MEQ PO TBCR
40.0000 meq | EXTENDED_RELEASE_TABLET | ORAL | Status: AC
  Administered 2018-10-23 (×2): 40 meq via ORAL
  Filled 2018-10-23: qty 2
  Filled 2018-10-23: qty 4

## 2018-10-23 MED ORDER — ROSUVASTATIN CALCIUM 40 MG PO TABS
40.0000 mg | ORAL_TABLET | Freq: Every day | ORAL | Status: DC
  Filled 2018-10-23 (×2): qty 1

## 2018-10-23 MED ORDER — SENNOSIDES-DOCUSATE SODIUM 8.6-50 MG PO TABS
2.0000 | ORAL_TABLET | Freq: Two times a day (BID) | ORAL | Status: DC
  Administered 2018-10-23 – 2018-10-24 (×2): 2 via ORAL
  Filled 2018-10-23 (×2): qty 2

## 2018-10-23 MED ORDER — POLYETHYLENE GLYCOL 3350 17 G PO PACK
17.0000 g | PACK | Freq: Every day | ORAL | Status: DC
  Administered 2018-10-23 – 2018-10-24 (×2): 17 g via ORAL
  Filled 2018-10-23 (×2): qty 1

## 2018-10-23 MED ORDER — IPRATROPIUM-ALBUTEROL 0.5-2.5 (3) MG/3ML IN SOLN
3.0000 mL | Freq: Two times a day (BID) | RESPIRATORY_TRACT | Status: DC
  Administered 2018-10-23 – 2018-10-24 (×2): 3 mL via RESPIRATORY_TRACT
  Filled 2018-10-23 (×2): qty 3

## 2018-10-23 NOTE — Progress Notes (Signed)
PROGRESS NOTE    Morgan Fischer  RFF:[id number] DOB: 03-May-1950 DOA: 10/20/2018 PCP: Jackie Plum, MD   Brief Narrative:  68 year old with history of asthma/COPD, chronic hypoxia on 2 L nasal cannula, CHF,  diabetes mellitus type 2, essential hypertension, hyperlipidemia, morbid obesity presented with shortness of breath and lower extremity swelling.  Upon admission she was noted to be tachypneic with BNP of 600.  EKG showing ST-T changes, prolonged QTC.  Chest x-ray was consistent with mild atelectasis of the right base.  COVID-19-negative.   Assessment & Plan:   Principal Problem:   Acute on chronic respiratory failure with hypoxia (HCC) Active Problems:   Morbid obesity (HCC)   Chronic diastolic heart failure (HCC)   Hypertensive heart disease with heart failure (HCC)   Chronic obstructive pulmonary disease with acute exacerbation (HCC)   Hyperglycemia due to type 2 diabetes mellitus (HCC)   HOCM (hypertrophic obstructive cardiomyopathy) (HCC)   Essential hypertension  Acute on chronic hypoxic respiratory distress, multifactorial -Shortness of breath is combination of fluid overload secondary to diastolic CHF and COPD exacerbation. -COVID-19-negative.  D-dimer-negative -Incentive spirometer to help with atelectasis  Acute diastolic congestive heart failure with ejection fraction 60%, grade 2 diastolic dysfunction, class III Hypertrophic obstructive cardiomyopathy -proBNP falsely low due to her body habitus. -Echocardiogram 10/21/2018-ejection fraction 60 to 65%, severe LVH with concerns for HOCM.  Mild mitral aortic valve stenosis.  Moderate elevated pulmonary arterial systolic pressures. -Difficult to assess volume status but will give her IV Lasix to bring her down to euvolemic or slightly drier side.  Monitor urine output. -Daily weights -Consult cardiology for appropriate med management -Closely monitor electrolytes  Acute mild COPD exacerbation with possible  underlying bronchitis -Bronchodilators.  Discontinue Solu-Medrol -Doxycycline day 3  Diabetes mellitus type 2, insulin-dependent -Lantus 15 units daily.  Insulin sliding scale Accu-Cheks -Premeal insulin ordered -Hemoglobin A1c 7.9  Essential hypertension -Discontinue Norvasc 5 mg daily, continue atenolol 100 mg daily, losartan 100 mg daily  Hyperlipidemia -Crestor 40 mg at bedtime  Morbid obesity with BMI greater than 50. -Needs weight loss diet and exercise.  Dietitian consulted  Apparently her previous to sleep study has been negative outpatient  DVT prophylaxis: Lovenox Code Status: Full Family Communication: None at bedside Disposition Plan: Maintain hospital stay for IV diuretics  Consultants:   Cardiology  Procedures:   None  Antimicrobials:   Doxycycline day 2   Subjective: Feeling better.  Back to home oxygen problem nausea no vomiting but was able to sleep last night.  Reports constipation.  Review of Systems Otherwise negative except as per HPI, including: General: Denies fever, chills, night sweats or unintended weight loss. Resp: Denies cough, wheezing Cardiac: Denies chest pain, palpitations, orthopnea, paroxysmal nocturnal dyspnea. GI: Denies abdominal pain, nausea, vomiting, diarrhea or constipation GU: Denies dysuria, frequency, hesitancy or incontinence MS: Denies muscle aches, joint pain or swelling Neuro: Denies headache, neurologic deficits (focal weakness, numbness, tingling), abnormal gait Psych: Denies anxiety, depression, SI/HI/AVH Skin: Denies new rashes or lesions ID: Denies sick contacts, exotic exposures, travel  Objective: Vitals:   10/22/18 2309 10/23/18 0529 10/23/18 1327 10/23/18 1414  BP:  (!) 109/57  (!) 105/58  Pulse: 60 (!) 54  (!) 56  Resp: 20 18  19   Temp:  (!) 97.5 F (36.4 C)  (!) 97.5 F (36.4 C)  TempSrc:  Oral  Oral  SpO2: 98% 100% 97% 100%  Weight:  132.5 kg    Height:        Intake/Output Summary (Last  24 hours) at 10/23/2018 1749 Last data filed at 10/23/2018 1400 Gross per 24 hour  Intake 1260 ml  Output 1600 ml  Net -340 ml   Filed Weights   10/20/18 1806 10/22/18 0500 10/23/18 0529  Weight: 132 kg 133.4 kg 132.5 kg    Examination:  General exam: Appears calm and comfortable, morbidly obese Respiratory system: Normal bibasilar crackles Cardiovascular system: S1 & S2 heard, RRR. No JVD, murmurs, rubs, gallops or clicks.  Trace bilateral lower extremity pitting edema Gastrointestinal system: Abdomen is nondistended, soft and nontender. No organomegaly or masses felt. Normal bowel sounds heard. Central nervous system: Alert and oriented. No focal neurological deficits. Extremities: Symmetric 5 x 5 power. Skin: No rashes, lesions or ulcers Psychiatry: Judgement and insight appear normal. Mood & affect appropriate.     Data Reviewed:   CBC: Recent Labs  Lab 10/20/18 1828 10/22/18 0519  WBC 7.2 9.3  NEUTROABS 4.5  --   HGB 11.3* 9.7*  HCT 38.8 32.9*  MCV 92.2 91.1  PLT 149* 122*   Basic Metabolic Panel: Recent Labs  Lab 10/20/18 1828 10/22/18 0519 10/22/18 1052 10/23/18 0541  NA 144 142  --  142  K 3.6 3.7  --  3.4*  CL 99 98  --  97*  CO2 33* 34*  --  34*  GLUCOSE 226* 241*  --  121*  BUN 21 27*  --  34*  CREATININE 0.67 0.85  --  0.84  CALCIUM 9.2 9.0  --  8.9  MG  --   --  2.1  --    GFR: Estimated Creatinine Clearance: 85 mL/min (by C-G formula based on SCr of 0.84 mg/dL). Liver Function Tests: No results for input(s): AST, ALT, ALKPHOS, BILITOT, PROT, ALBUMIN in the last 168 hours. No results for input(s): LIPASE, AMYLASE in the last 168 hours. No results for input(s): AMMONIA in the last 168 hours. Coagulation Profile: No results for input(s): INR, PROTIME in the last 168 hours. Cardiac Enzymes: No results for input(s): CKTOTAL, CKMB, CKMBINDEX, TROPONINI in the last 168 hours. BNP (last 3 results) No results for input(s): PROBNP in the last  8760 hours. HbA1C: Recent Labs    10/22/18 0519  HGBA1C 7.9*   CBG: Recent Labs  Lab 10/22/18 1604 10/22/18 2100 10/23/18 0736 10/23/18 1135 10/23/18 1629  GLUCAP 256* 197* 124* 183* 198*   Lipid Profile: No results for input(s): CHOL, HDL, LDLCALC, TRIG, CHOLHDL, LDLDIRECT in the last 72 hours. Thyroid Function Tests: No results for input(s): TSH, T4TOTAL, FREET4, T3FREE, THYROIDAB in the last 72 hours. Anemia Panel: No results for input(s): VITAMINB12, FOLATE, FERRITIN, TIBC, IRON, RETICCTPCT in the last 72 hours. Sepsis Labs: Recent Labs  Lab 10/23/18 0541  PROCALCITON <0.10    Recent Results (from the past 240 hour(s))  SARS Coronavirus 2 by RT PCR (hospital order, [facility]) Nasopharyngeal Nasopharyngeal Swab     Status: None   Collection Time: 10/20/18  6:28 PM   Specimen: Nasopharyngeal Swab  Result Value Ref Range Status   SARS Coronavirus 2 NEGATIVE NEGATIVE Final    Comment: (NOTE) If result is NEGATIVE SARS-CoV-2 target nucleic acids are NOT DETECTED. The SARS-CoV-2 RNA is generally detectable in upper and lower  respiratory specimens during the acute phase of infection. The lowest  concentration of SARS-CoV-2 viral copies this assay can detect is 250  copies / mL. A negative result does not preclude SARS-CoV-2 infection  and should not be used as the sole  basis for treatment or other  patient management decisions.  A negative result may occur with  improper specimen collection / handling, submission of specimen other  than nasopharyngeal swab, presence of viral mutation(s) within the  areas targeted by this assay, and inadequate number of viral copies  (<250 copies / mL). A negative result must be combined with clinical  observations, patient history, and epidemiological information. If result is POSITIVE SARS-CoV-2 target nucleic acids are DETECTED. The SARS-CoV-2 RNA is generally detectable in upper and lower  respiratory  specimens dur ing the acute phase of infection.  Positive  results are indicative of active infection with SARS-CoV-2.  Clinical  correlation with patient history and other diagnostic information is  necessary to determine patient infection status.  Positive results do  not rule out bacterial infection or co-infection with other viruses. If result is PRESUMPTIVE POSTIVE SARS-CoV-2 nucleic acids MAY BE PRESENT.   A presumptive positive result was obtained on the submitted specimen  and confirmed on repeat testing.  While 2019 novel coronavirus  (SARS-CoV-2) nucleic acids may be present in the submitted sample  additional confirmatory testing may be necessary for epidemiological  and / or clinical management purposes  to differentiate between  SARS-CoV-2 and other Sarbecovirus currently known to infect humans.  If clinically indicated additional testing with an alternate test  methodology [phone]) is advised. The SARS-CoV-2 RNA is generally  detectable in upper and lower respiratory sp ecimens during the acute  phase of infection. The expected result is Negative. Fact Sheet for Patients:  StrictlyIdeas.no Fact Sheet for Healthcare Providers: BankingDealers.co.za This test is not yet approved or cleared by the [place] FDA and has been authorized for detection and/or diagnosis of SARS-CoV-2 by FDA under an Emergency Use Authorization (EUA).  This EUA will remain in effect (meaning this test can be used) for the duration of the COVID-19 declaration under Section 564(b)(1) of the Act, 21 U.S.C. section 360bbb-3(b)(1), unless the authorization is terminated or revoked sooner. Performed at [facility], [address], [place], [place] [address]          Radiology Studies: No results found.      Scheduled Meds: . allopurinol  300 mg Oral Daily  . aspirin  81 mg Oral Daily  . atenolol  100 mg Oral Daily  .  calcium-vitamin D  1 tablet Oral BID  . doxycycline  100 mg Oral Q12H  . enoxaparin (LOVENOX) injection  60 mg Subcutaneous Q24H  . furosemide  80 mg Intravenous BID  . insulin aspart  0-20 Units Subcutaneous TID WC  . insulin aspart  0-5 Units Subcutaneous QHS  . insulin aspart  6 Units Subcutaneous TID WC  . insulin glargine  15 Units Subcutaneous Daily  . ipratropium-albuterol  3 mL Nebulization BID  . losartan  100 mg Oral Daily  . mouth rinse  15 mL Mouth Rinse BID  . mometasone-formoterol  2 puff Inhalation BID  . montelukast  10 mg Oral QHS  . rosuvastatin  40 mg Oral QHS   Continuous Infusions:   LOS: 2 days   Time spent= 28 mins    Berle Mull, MD [facility]  If 7PM-7AM, please contact night-coverage  10/23/2018, 5:49 PM

## 2018-10-23 NOTE — Progress Notes (Signed)
Progress Note  Patient Name: Morgan Fischer Date of Encounter: 10/23/2018  Primary Cardiologist: Dr. Dot Been   Subjective   No significant overnight events. Patient feels like she is breathing much better today. Pursed lip breathing has completely resolved. She told Dr. Allena Katz that this is the best she has felt in a long time.   Inpatient Medications    Scheduled Meds: . allopurinol  300 mg Oral Daily  . aspirin  81 mg Oral Daily  . atenolol  100 mg Oral Daily  . calcium-vitamin D  1 tablet Oral BID  . doxycycline  100 mg Oral Q12H  . enoxaparin (LOVENOX) injection  60 mg Subcutaneous Q24H  . furosemide  80 mg Intravenous BID  . insulin aspart  0-20 Units Subcutaneous TID WC  . insulin aspart  0-5 Units Subcutaneous QHS  . insulin aspart  6 Units Subcutaneous TID WC  . insulin glargine  15 Units Subcutaneous Daily  . ipratropium-albuterol  3 mL Nebulization TID  . losartan  100 mg Oral Daily  . mouth rinse  15 mL Mouth Rinse BID  . mometasone-formoterol  2 puff Inhalation BID  . montelukast  10 mg Oral QHS  . potassium chloride  40 mEq Oral Q2H  . rosuvastatin  40 mg Oral QHS   Continuous Infusions:  PRN Meds: acetaminophen **OR** acetaminophen, albuterol, HYDROcodone-acetaminophen, ipratropium-albuterol, nitroGLYCERIN, ondansetron **OR** ondansetron (ZOFRAN) IV, senna-docusate   Vital Signs    Vitals:   10/22/18 2031 10/22/18 2107 10/22/18 2309 10/23/18 0529  BP:  (!) 131/47  (!) 109/57  Pulse:  63 60 (!) 54  Resp:  18 20 18   Temp:  98 F (36.7 C)  (!) 97.5 F (36.4 C)  TempSrc:  Oral  Oral  SpO2: 100% 100% 98% 100%  Weight:    132.5 kg  Height:        Intake/Output Summary (Last 24 hours) at 10/23/2018 1158 Last data filed at 10/23/2018 1007 Gross per 24 hour  Intake 1260 ml  Output 1900 ml  Net -640 ml   Last 3 Weights 10/23/2018 10/22/2018 10/20/2018  Weight (lbs) 292 lb 1.8 oz 294 lb 1.5 oz 291 lb  Weight (kg) 132.5 kg 133.4 kg 131.997 kg       Telemetry    Alternating between sinus rhythm and junctional rhythm with rates in the 50's to 60's. - Personally Reviewed  ECG    No new ECG tracing today. - Personally Reviewed  Physical Exam   GEN: Morbidly obese female in acute distress.   Neck: Supple. Difficult to assess JVD due to body habitus. Cardiac: RRR. II-III/VI systolic murmur best heard at upper sternal border. No rubs or gallops. Respiratory: No increased work of breathing. Possible mild crackles in bases but difficult to tell due to body habitus. Lungs mostly clear.  GI: Soft, non-distended, and non-tender.  MS: 1+ edema of bilaterally lower extremities. No deformity. Neuro:  No focal deficits. Psych: Normal affect. Responds appropriately.  Labs    High Sensitivity Troponin:   Recent Labs  Lab 10/20/18 1828 10/21/18 0957  TROPONINIHS 18* 14      Chemistry Recent Labs  Lab 10/20/18 1828 10/22/18 0519 10/23/18 0541  NA 144 142 142  K 3.6 3.7 3.4*  CL 99 98 97*  CO2 33* 34* 34*  GLUCOSE 226* 241* 121*  BUN 21 27* 34*  CREATININE 0.67 0.85 0.84  CALCIUM 9.2 9.0 8.9  GFRNONAA >60 >60 >60  GFRAA >60 >60 >60  ANIONGAP 12 10  11     Hematology Recent Labs  Lab 10/20/18 1828 10/22/18 0519  WBC 7.2 9.3  RBC 4.21 3.61*  HGB 11.3* 9.7*  HCT 38.8 32.9*  MCV 92.2 91.1  MCH 26.8 26.9  MCHC 29.1* 29.5*  RDW 15.7* 15.4  PLT 149* 122*    BNP Recent Labs  Lab 10/20/18 1828  BNP 671.3*     DDimer  Recent Labs  Lab 10/21/18 0957  DDIMER 0.43     Radiology    No results found.  Cardiac Studies   Echocardiogram 10/21/2018: Impressions:  1. Left ventricular ejection fraction, by visual estimation, is 60 to 65%. The left ventricle has normal function. Normal left ventricular size. There is severely increased left ventricular hypertrophy.  2. Chordal SAM with small LVOT gradient with severe LVH findings may be consistent with HOCM.  3. Global right ventricle has normal systolic  function.The right ventricular size is normal. No increase in right ventricular wall thickness.  4. Left atrial size was moderately dilated.  5. Right atrial size was normal.  6. Moderate mitral annular calcification.  7. The mitral valve is normal in structure. Trace mitral valve regurgitation. No evidence of mitral stenosis.  8. The tricuspid valve is normal in structure. Tricuspid valve regurgitation is mild.  9. The aortic valve was not well visualized Aortic valve regurgitation was not visualized by color flow Doppler. Mild aortic valve stenosis. 10. The pulmonic valve was normal in structure. Pulmonic valve regurgitation is mild by color flow Doppler. 11. Moderately elevated pulmonary artery systolic pressure. 12. The inferior vena cava is normal in size with greater than 50% respiratory variability, suggesting right atrial pressure of 3 mmHg.  Patient Profile     Morgan Fischer is a 68 y.o. female with a history of CAD noted on chest CT in 2018, chronic diastolic CHF/ HOCM, moderate aortic stenosis, COPD and asthma on 2 L of home O2, hypertension, hyperlipidemia, type 2 diabetes mellitus, and morbid obesity with BMI of 51.28 who is being seen today for the evaluation of acute on chronic diastolic CHF at the request of Dr. Nelson ChimesAmin.  Assessment & Plan    Acute on Chronic Diastolic CHF/ HOCM - Patient presented with worsening shortness of breath. - Chest x-ray showed no overt edema. - BNP elevated in the 600's. Likely higher given morbid obesity. - Echo showed LVEF of 60-65% with severe LVH. Chordal SAM with small LVOT graadient with severe LVH findings consistent with HOCM. Also showed moderately dilated left atrial size, trace MR, mild TR, mild AS, mild PR, and moderately elevated PASP. - IV Lasix increase to 80mg  twice daily yesterday. Documented urinary output of 1.9 L in the past 24 hours. Weight down 2 lbs from yesterday. Renal function stable. - Will continue IV Lasix today. Can likely  transition to PO tomorrow. - Continue BP control. - Continue to monitor daily weights, strict I/O's, and renal function.  CAD - She had Myoview in 2016 which was non-conclusive due to body habitus but showed possible mild ischemia. Chest CT in 2018 noted coronary artery calcifications.  - EKG shows T wave changes in lateral leads but this is unchanged from 2017. - High-sensitivity troponin borderline flat at 18 >> 14. - Patient denies any angina. - Currently on Aspirin 325mg  daily which she states she has been taken for a long time. No history of stroke and see no indication for this high dose so will reduce to 81mg  daily. Continue beta-blocker and home statin.   Junctional  Rhythm - Patient had intermittent junctional rhythm rate on telemetry with rates in 50's to 60's. - Continue Atenolol.   Hypertension - Systolic BP has been as high as the 170's this admission. Most recent BP 109/57. - Home medications include Hydralazine 25mg  three times daily, Atenolol 100mg  daily, and Losartan 100mg  daily. Currently on home Atenolol and Losartan but no on Hydralazine here. Amlodipine 5mg  was initially started but was discontinue today. - Given HOCM, need to be cautious and not drop BP too low.   Hypokalemia - Potassium 3.4 today after increase in Lasix. - Primary team already ordered K-Dur 40 mEq twice daily.  Hyperlipidemia - Continue home Crestor.  Type 2 Diabetes Mellitus - Hemoglobin A1c 7.9 this admission.  Morbid Obesity - BMI 51.28. - Discussed importance of weight loss.  - Consulted Nutrition yesterday.  Sleep Apnea - Compliant with BiPAP at night.   Anemia - Hemoglobin 9.7 yesterday, down from 11.3 from day before.  - Patient denies any abnormal bleeding including hematochezia, melena, and hematuria. - Will defer additional work-up to primary team.   For questions or updates, please contact [facility] Please consult [email or web address] for contact info under         Signed, Darreld Mclean, PA-C  10/23/2018, 11:58 AM

## 2018-10-23 NOTE — Discharge Instructions (Signed)
Low Sodium Nutrition Therapy  °Eating less sodium can help you if you have high blood pressure, heart failure, or kidney or liver disease.  ° °Your body needs a little sodium, but too much sodium can cause your body to hold onto extra water. This extra water will raise your blood pressure and can cause damage to your heart, kidneys, or liver as they are forced to work harder.  ° °Sometimes you can see how the extra fluid affects you because your hands, legs, or belly swell. You may also hold water around your heart and lungs, which makes it hard to breathe.  ° °Even if you take medication for blood pressure or a water pill (diuretic) to remove fluid, it is still important to have less salt in your diet.  ° °Check with your primary care provider before drinking alcohol since it may affect the amount of fluid in your body and how your heart, kidneys, or liver work. °Sodium in Food °A low-sodium meal plan limits the sodium that you get from food and beverages to 1,500-2,000 milligrams (mg) per day. Salt is the main source of sodium. Read the nutrition label on the package to find out how much sodium is in one serving of a food.  °• Select foods with 140 milligrams (mg) of sodium or less per serving.  °• You may be able to eat one or two servings of foods with a little more than 140 milligrams (mg) of sodium if you are closely watching how much sodium you eat in a day.  °• Check the serving size on the label. The amount of sodium listed on the label shows the amount in one serving of the food. So, if you eat more than one serving, you will get more sodium than the amount listed. ° °Tips °Cutting Back on Sodium °• Eat more fresh foods.  °• Fresh fruits and vegetables are low in sodium, as well as frozen vegetables and fruits that have no added juices or sauces.  °• Fresh meats are lower in sodium than processed meats, such as bacon, sausage, and hotdogs.  °• Not all processed foods are unhealthy, but some processed foods  may have too much sodium.  °• Eat less salt at the table and when cooking. One of the ingredients in salt is sodium.  °• One teaspoon of table salt has 2,300 milligrams of sodium.  °• Leave the salt out of recipes for pasta, casseroles, and soups. °• Be a smart shopper.  °• Food packages that say “Salt-free”, sodium-free”, “very low sodium,” and “low sodium” have less than 140 milligrams of sodium per serving.  °• Beware of products identified as “Unsalted,” “No Salt Added,” “Reduced Sodium,” or “Lower Sodium.” These items may still be high in sodium. You should always check the nutrition label. °• Add flavors to your food without adding sodium.  °• Try lemon juice, lime juice, or vinegar.  °• Dry or fresh herbs add flavor.  °• Buy a sodium-free seasoning blend or make your own at home. °• You can purchase salt-free or sodium-free condiments like barbeque sauce in stores and online. Ask your registered dietitian nutritionist for recommendations and where to find them.  °•  °Eating in Restaurants °• Choose foods carefully when you eat outside your home. Restaurant foods can be very high in sodium. Many restaurants provide nutrition facts on their menus or their websites. If you cannot find that information, ask your server. Let your server know that you want your food   to be cooked without salt and that you would like your salad dressing and sauces to be served on the side.      Foods Recommended  Food Group  Foods Recommended   Grains  Bread, bagels, rolls without salted tops Homemade bread made with reduced-sodium baking powder Cold cereals, especially shredded wheat and puffed rice Oats, grits, or cream of wheat Pastas, quinoa, and rice Popcorn, pretzels or crackers without salt Corn tortillas   Protein Foods  Fresh meats and fish; [place] bacon (check the nutrition labels - make sure they are not packaged in a sodium solution) Canned or packed tuna (no more than 4 ounces at 1 serving) Beans  and peas Soybeans) and tofu Eggs Nuts or nut butters without salt   Dairy  Milk or milk powder Plant milks, such as rice and soy Yogurt, including Greek yogurt Small amounts of natural cheese (blocks of cheese) or reduced-sodium cheese can be used in moderation. (Swiss, ricotta, and fresh mozzarella cheese are lower in sodium than the others) Cream Cheese Low sodium cottage cheese   Vegetables  Fresh and frozen vegetables without added sauces or salt Homemade soups (without salt) Low-sodium, salt-free or sodium-free canned vegetables and soups   Fruit  Fresh and canned fruits Dried fruits, such as raisins, cranberries, and prunes   Oils  Tub or liquid margarine, regular or without salt Canola, corn, peanut, olive, safflower, or sunflower oils   Condiments  Fresh or dried herbs such as basil, bay leaf, dill, mustard (dry), nutmeg, paprika, parsley, rosemary, sage, or thyme.  Low sodium ketchup Vinegar  Lemon or lime juice Pepper, red pepper flakes, and cayenne. Hot sauce contains sodium, but if you use just a drop or two, it will not add up to much.  Salt-free or sodium-free seasoning mixes and marinades Simple salad dressings: vinegar and oil     Foods Not Recommended  Food Group  Foods Not Recommended   Grains  Breads or crackers topped with salt Cereals (hot/cold) with more than 300 mg sodium per serving Biscuits, cornbread, and other quick breads prepared with baking soda Pre-packaged bread crumbs Seasoned and packaged rice and pasta mixes Self-rising flours   Protein Foods  Cured meats: Bacon, ham, sausage, pepperoni and hot dogs Canned meats (chili, vienna sausage, or sardines) Smoked fish and meats Frozen meals that have more than 600 mg of sodium per serving Egg substitute (with added sodium)   Dairy  Buttermilk Processed cheese spreads Cottage cheese (1 cup may have over 500 mg of sodium; look for low-sodium.) American or feta cheese Shredded  Cheese has more sodium than blocks of cheese String cheese   Vegetables  Canned vegetables (unless they are salt-free, sodium-free or low sodium) Frozen vegetables with seasoning and sauces Sauerkraut and pickled vegetables Canned or dried soups (unless they are salt-free, sodium-free, or low sodium) [place] fries and onion rings   Fruit  Dried fruits preserved with additives that have sodium   Oils  Salted butter or margarine, all types of olives   Condiments  Salt, sea salt, kosher salt, onion salt, and garlic salt Seasoning mixes with salt Bouillon cubes Ketchup Barbeque sauce and Worcestershire sauce unless low sodium Soy sauce Salsa, pickles, olives, relish Salad dressings: ranch, blue cheese, [place], and [place].     Low Sodium Sample 1-Day Menu   Breakfast  1 cup cooked oatmeal   1 slice whole wheat bread toast   1 tablespoon peanut butter without salt   1 banana  1 cup 1% milk   Lunch  Tacos made with: 2 corn tortillas    cup black beans, low sodium    cup roasted or grilled chicken (without skin)    avocado   Squeeze of lime juice   1 cup salad greens   1 tablespoon low-sodium salad dressing    cup strawberries   1 orange   Afternoon Snack  1/3 cup grapes   6 ounces yogurt   Evening Meal  3 ounces herb-baked fish   1 baked potato   2 teaspoons olive oil    cup cooked carrots   2 thick slices tomatoes on:   2 lettuce leaves   1 teaspoon olive oil   1 teaspoon balsamic vinegar   1 cup 1% milk   Evening Snack  1 apple    cup almonds without salt     Low-Sodium Vegetarian (Lacto-Ovo) Sample 1-Day Menu   Breakfast  1 cup cooked oatmeal   1 slice whole wheat toast   1 tablespoon peanut butter without salt   1 banana   1 cup 1% milk   Lunch  Tacos made with: 2 corn tortillas    cup black beans, low sodium    cup roasted or grilled chicken (without skin)    avocado   Squeeze of lime juice   1  cup salad greens   1 tablespoon low-sodium salad dressing    cup strawberries   1 orange   Evening Meal  Stir fry made with:  cup tofu   1 cup brown rice    cup broccoli    cup green beans    cup peppers    tablespoon peanut oil   1 orange   1 cup 1% milk   Evening Snack  4 strips celery   2 tablespoons hummus   1 hard-boiled egg     Low-Sodium Vegan Sample 1-Day Menu   Breakfast  1 cup cooked oatmeal   1 tablespoon peanut butter without salt   1 cup blueberries   1 cup soymilk fortified with calcium, vitamin B12, and vitamin D   Lunch  1 small whole wheat pita    cup cooked lentils   2 tablespoons hummus   4 carrot sticks   1 medium apple   1 cup soymilk fortified with calcium, vitamin B12, and vitamin D   Evening Meal  Stir fry made with:  cup tofu   1 cup brown rice    cup broccoli    cup green beans    cup peppers    tablespoon peanut oil   1 cup cantaloupe   Evening Snack  1 cup soy yogurt    cup mixed nuts   Copyright 2020  [employer]. All rights reserved    Sodium Free Flavoring Tips    When cooking, the following items may be used for flavoring instead of salt or seasonings that contain sodium.  Remember: A little bit of spice goes a long way! Be careful not to overseason.  Spice Blend Recipe (makes about ? cup)  5 teaspoons onion powder   2 teaspoons garlic powder   2 teaspoons paprika   2 teaspoon dry mustard   1 teaspoon crushed thyme leaves    teaspoon white pepper    teaspoon celery seed Food Item Flavorings  Beef Basil, bay leaf, caraway, curry, dill, dry mustard, garlic, grape jelly, green pepper, mace, marjoram, mushrooms (fresh), nutmeg, onion or onion powder, parsley, pepper,  rosemary, sage  Chicken Basil, cloves, cranberries, mace, mushrooms (fresh), nutmeg, oregano, paprika, parsley, pineapple, saffron, sage, savory, tarragon, thyme, tomato,  turmeric  Egg Chervil, curry, dill, dry mustard, garlic or garlic powder, green pepper, jelly, mushrooms (fresh), nutmeg, onion powder, paprika, parsley, rosemary, tarragon, tomato  Fish Basil, bay leaf, chervil, curry, dill, dry mustard, green pepper, lemon juice, marjoram, mushrooms (fresh), paprika, pepper, tarragon, tomato, turmeric  Lamb Cloves, curry, dill, garlic or garlic powder, mace, mint, mint jelly, onion, oregano, parsley, pineapple, rosemary, tarragon, thyme  Pork Applesauce, basil, caraway, chives, cloves, garlic or garlic powder, onion or onion powder, rosemary, thyme  Veal Apricots, basil, bay leaf, currant jelly, curry, ginger, marjoram, mushrooms (fresh), oregano, paprika  Vegetables Basil, dill, garlic or garlic powder, ginger, lemon juice, mace, marjoram, nutmeg, onion or onion powder, tarragon, tomato, sugar or sugar substitute, salt-free salad dressing, vinegar  Desserts Allspice, anise, cinnamon, cloves, ginger, mace, nutmeg, vanilla extract, other extracts   Copyright 2020  [employer]y [employer]. All rights reserved  Fluid Restricted Nutrition Therapy  You have been prescribed this diet because your condition affects how much fluid you can eat or drink. If your heart, liver, or kidneys aren't working properly, you may not be able to effectively eliminate fluids from the body and this may cause swelling (edema) in the legs, arms, and/or stomach. Drink no more than _________ liters or ________ ounces or ________cups of fluid per day.   You don't need to stop eating or drinking the same fluids you normally would, but you may need to eat or drink less than usual.   Your registered dietitian nutritionist will help you determine the correct amount of fluid to consume during the day Breakfast Include fluids taken with medications  Lunch Include fluids taken with medications  Dinner Include fluids taken with medications  Bedtime Snack Include fluids taken with  medications     Tips What Are Fluids?  A fluid is anything that is liquid or anything that would melt if left at room temperature. You will need to count these foods and liquids--including any liquid used to take medication--as part of your daily fluid intake. Some examples are:  Alcohol (drink only with your doctor's permission)   Coffee, tea, and other hot beverages   Gelatin (Jell-O)   Gravy   Ice cream, sherbet, sorbet   Ice cubes, ice chips   Milk, liquid creamer   Nutritional supplements   Popsicles   Vegetable and fruit juices; fluid in canned fruit   Watermelon   Yogurt   Soft drinks, lemonade, limeade   Soups   Syrup How Do I Measure My Fluid Intake?  Record your fluid intake daily.   Tip: Every day, each time you eat or drink fluids, pour water in the same amount into an empty container that can hold the same amount of fluids you are allowed daily. This may help you keep track of how much fluid you are taking in throughout the day.   To accurately keep track of how much liquid you take in, measure the size of the cups, glasses, and bowls you use. If you eat soup, measure how much of it is liquid and how much is solid (such as noodles, vegetables, meat). Conversions for Measuring Fluid Intake  Milliliters (mL) Liters (L) Ounces (oz) Cups (c)  1000 1 32 4  1200 1.2 40 5  1500 1.5 50 6 1/4  1800 1.8 60 7 1/2  2000 2 67 8 1/3  Tips to Reduce Your Thirst  Chew gum or suck on hard candy.   Rinse or gargle with mouthwash. Do not swallow.   Ice chips or popsicles my help quench thirst, but this too needs to be calculated into the total restriction. Melt ice chips or cubes first to figure out how much fluid they produce (for example, experiment with melting  cup ice chips or 2 ice cubes).   Add a lemon wedge to your water.   Limit how much salt you take in. A high salt intake might make you thirstier.   Don't eat or drink all your allowed liquids at  once. Space your liquids out through the day.   Use small glasses and cups and sip slowly. If allowed, take your medications with fluids you eat or drink during a meal.   Fluid-Restricted Nutrition Therapy Sample 1-Day Menu  Breakfast 1 slice wheat toast  1 tablespoon peanut butter  1/2 cup yogurt (120 milliliters)  1/2 cup blueberries  1 cup milk (240 milliliters)   Lunch 3 ounces sliced [place]  2 slices whole wheat bread  1/2 cup lettuce for sandwich  2 slices tomato for sandwich  1 ounce reduced-fat, reduced-sodium cheese  1/2 cup fresh carrot sticks  1 banana  1 cup unsweetened tea (240 milliliters)   Evening Meal 8 ounces soup (240 milliliters)  3 ounces salmon  1/2 cup quinoa  1 cup green beans  1 cup mixed greens salad  1 tablespoon olive oil  1 cup coffee (240 milliliters)  Evening Snack 1/2 cup sliced peaches  1/2 cup frozen yogurt (120 milliliters)  1 cup water (240 milliliters)  Copyright 2020  Academy [employer]. All rights reserved    Carbohydrate Counting For People With Diabetes  Why Is Carbohydrate Counting Important?  Counting carbohydrate servings may help you control your blood glucose level so that you feel better.   The balance between the carbohydrates you eat and insulin determines what your blood glucose level will be after eating.   Carbohydrate counting can also help you plan your meals. Which Foods Have Carbohydrates? Foods with carbohydrates include:  Breads, crackers, and cereals   Pasta, rice, and grains   Starchy vegetables, such as potatoes, corn, and peas   Beans and legumes   Milk, soy milk, and yogurt   Fruits and fruit juices   Sweets, such as cakes, cookies, ice cream, jam, and jelly Carbohydrate Servings In diabetes meal planning, 1 serving of a food with carbohydrate has about 15 grams of carbohydrate:  Check serving sizes with measuring cups and spoons or a food scale.   Read the Nutrition Facts on  food labels to find out how many grams of carbohydrate are in foods you eat. The food lists in this handout show portions that have about 15 grams of carbohydrate.  Tips Meal Planning Tips  An Eating Plan tells you how many carbohydrate servings to eat at your meals and snacks. For many adults, eating 3 to 5 servings of carbohydrate foods at each meal and 1 or 2 carbohydrate servings for each snack works well.   In a healthy daily Eating Plan, most carbohydrates come from:   At least 6 servings of fruits and nonstarchy vegetables   At least 6 servings of grains, beans, and starchy vegetables, with at least 3 servings from whole grains   At least 2 servings of milk or milk products  Check your blood glucose level regularly. It can tell you if  you need to adjust when you eat carbohydrates.   Eating foods that have fiber, such as whole grains, and having very few salty foods is good for your health.   Eat 4 to 6 ounces of meat or other protein foods (such as soybean burgers) each day. Choose low-fat sources of protein, such as lean beef, lean pork, chicken, fish, low-fat cheese, or vegetarian foods such as soy.   Eat some healthy fats, such as olive oil, canola oil, and nuts.   Eat very little saturated fats. These unhealthy fats are found in butter, cream, and high-fat meats, such as bacon and sausage.   Eat very little or no trans fats. These unhealthy fats are found in all foods that list partially hydrogenated oil as an ingredient.  Label Reading Tips The Nutrition Facts panel on a label lists the grams of total carbohydrate in 1 standard serving. The label's standard serving may be larger or smaller than 1 carbohydrate serving. To figure out how many carbohydrate servings are in the food:  First, look at the label's standard serving size.   Check the grams of total carbohydrate. This is the amount of carbohydrate in 1 standard serving.   Divide the grams of total carbohydrate  by 15. This number equals the number of carbohydrate servings in 1 standard serving. Remember: 1 carbohydrate serving is 15 grams of carbohydrate.   Note: You may ignore the grams of sugars on the Nutrition Facts panel because they are included in the grams of total carbohydrate.  Foods Recommended 1 serving = about 15 grams of carbohydrate Starches  1 slice bread (1 ounce)   1 tortilla (6-inch size)    large bagel (1 ounce)   2 taco shells (5-inch size)    hamburger or hot dog bun ( ounce)    cup ready-to-eat unsweetened cereal    cup cooked cereal   1 cup broth-based soup   4 to 6 small crackers   1/3 cup pasta or rice (cooked)    cup beans, peas, corn, sweet potatoes, winter squash, or mashed or boiled potatoes (cooked)    large baked potato (3 ounces)    ounce pretzels, potato chips, or tortilla chips   3 cups popcorn (popped) Fruit  1 small fresh fruit ( to 1 cup)    cup canned or frozen fruit   2 tablespoons dried fruit (blueberries, cherries, cranberries, mixed fruit, raisins)   17 small grapes (3 ounces)   1 cup melon or berries    cup unsweetened fruit juice Milk  1 cup fat-free or reduced-fat milk   1 cup soy milk   2/3 cup (6 ounces) nonfat yogurt sweetened with sugar-free sweetener Sweets and Desserts  2-inch square cake (unfrosted)   2 small cookies (2/3 ounce)    cup ice cream or frozen yogurt    cup sherbet or sorbet   1 tablespoon syrup, jam, jelly, table sugar, or honey   2 tablespoons light syrup Other Foods  Count 1 cup raw vegetables or  cup cooked nonstarchy vegetables as zero (0) carbohydrate servings or free foods. If you eat 3 or more servings at one meal, count them as 1 carbohydrate serving.   Foods that have less than 20 calories in each serving also may be counted as zero carbohydrate servings or free foods.   Count 1 cup of casserole or other mixed foods as 2 carbohydrate servings.  Carbohydrate  Counting for People with Diabetes Sample 1-Day Menu  Breakfast 1 extra-small  banana (1 carbohydrate serving)  1 cup low-fat or fat-free milk (1 carbohydrate serving)  1 slice whole wheat bread (1 carbohydrate serving)  1 teaspoon margarine  Lunch 2 ounces [place] slices  2 slices whole wheat bread (2 carbohydrate servings)  2 lettuce leaves  4 celery sticks  4 carrot sticks  1 medium apple (1 carbohydrate serving)  1 cup low-fat or fat-free milk (1 carbohydrate serving)  Afternoon Snack 2 tablespoons raisins (1 carbohydrate serving)  3/4 ounce unsalted mini pretzels (1 carbohydrate serving)  Evening Meal 3 ounces lean roast beef  1/2 large baked potato (2 carbohydrate servings)  1 tablespoon reduced-fat sour cream  1/2 cup green beans  1 tablespoon light salad dressing  1 whole wheat dinner roll (1 carbohydrate serving)  1 teaspoon margarine  1 cup melon balls (1 carbohydrate serving)  Evening Snack 2 tablespoons unsalted nuts   Carbohydrate Counting for People with Diabetes Vegan Sample 1-Day Menu  Breakfast 1 cup cooked oatmeal (2 carbohydrate servings)   cup blueberries (1 carbohydrate serving)  2 tablespoons flaxseeds  1 cup soymilk fortified with calcium and vitamin D  1 cup coffee  Lunch 2 slices whole wheat bread (2 carbohydrate servings)   cup baked tofu   cup lettuce  2 slices tomato  2 slices avocado   cup baby carrots  1 orange (1 carbohydrate serving)  1 cup soymilk fortified with calcium and vitamin D   Evening Meal Burrito made with: 1 6-inch corn tortilla (1 carbohydrate serving)  1 cup refried vegetarian beans (1 carbohydrate serving)   cup chopped tomatoes   cup lettuce   cup salsa  1/3 cup brown rice (1 carbohydrate serving)  1 tablespoon olive oil for rice   cup zucchini   Evening Snack 6 small whole grain crackers (1 carbohydrate serving)  2 apricots ( carbohydrate serving)   cup unsalted peanuts ( carbohydrate serving)     Carbohydrate  Counting for People with Diabetes Vegetarian (Lacto-Ovo) Sample 1-Day Menu  Breakfast 1 cup cooked oatmeal (2 carbohydrate servings)   cup blueberries (1 carbohydrate serving)  2 tablespoons flaxseeds  1 egg  1 cup 1% milk (1 carbohydrate serving)  1 cup coffee  Lunch 2 slices whole wheat bread (2 carbohydrate servings)  2 ounces low-fat cheese   cup lettuce  2 slices tomato  2 slices avocado   cup baby carrots  1 orange (1 carbohydrate serving)  1 cup unsweetened tea  Evening Meal Burrito made with: 1 6-inch corn tortilla (1 carbohydrate serving)   cup refried vegetarian beans (1 carbohydrate serving)   cup tomatoes   cup lettuce   cup salsa  1/3 cup brown rice (1 carbohydrate serving)  1 tablespoon olive oil for rice   cup zucchini  1 cup 1% milk (1 carbohydrate serving)  Evening Snack 6 small whole grain crackers (1 carbohydrate serving)  2 apricots ( carbohydrate serving)   cup unsalted peanuts ( carbohydrate serving)    Copyright 2020  [employer]y [employer]. All rights reserved.  Using Nutrition Labels: Carbohydrate   Serving Size   Look at the serving size. All the information on the label is based on this portion.  Servings Per Container   The number of servings contained in the package.  Guidelines for Carbohydrate   Look at the total grams of carbohydrate in the serving size.   1 carbohydrate choice = 15 grams of carbohydrate. Range of Carbohydrate Grams Per Choice  Carbohydrate Grams/Choice Carbohydrate Choices  6-10  11-20 1  21-25 1  26-35 2  36-40 2  41-50 3  51-55 3  56-65 4  66-70 4  71-80 5    Copyright 2020  [employer]. All rights reserved.

## 2018-10-23 NOTE — Progress Notes (Signed)
Nutrition Education Note  RD consulted for nutrition education regarding new onset CHF.  **[place]. Answered all of pt's diet related questions. Pt states she already watches her sodium content but has not been counting carbohydrates in her diet (h/o diabetes). Pt states she tends to eat 2 meals a day.   RD to provide "Low Sodium Nutrition Therapy" and "Carbihydrate Counting" handouts from the Academy of Nutrition and Dietetics in discharge instructions. Reviewed patient's dietary recall. Provided examples on ways to decrease sodium intake in diet. Discouraged intake of processed foods and use of salt shaker. Encouraged fresh fruits and vegetables as well as whole grain sources of carbohydrates to maximize fiber intake.   RD discussed why it is important for patient to adhere to diet recommendations, and emphasized the role of fluids, foods to avoid, and importance of weighing self daily. Teach back method used.  Expect good compliance if she sets up outpatient education. Pt would benefit from continued follow-up on her diet.   Body mass index is 50.93 kg/m. Pt meets criteria for morbid obesity based on current BMI.  Current diet order is 2GMNA, patient is consuming approximately 100% of meals at this time. Labs and medications reviewed. No further nutrition interventions warranted at this time. If additional nutrition issues arise, please re-consult RD.   Clayton Bibles, MS, RD, LDN Inpatient Clinical Dietitian Pager: [phone] After Hours Pager: [phone]

## 2018-10-23 NOTE — TOC Initial Note (Signed)
Transition of Care [facility]) - Initial/Assessment Note    Patient Details  Name: Morgan Fischer MRN: [id number] Date of Birth: Jul 03, 1950  Transition of Care [facility]) CM/SW Contact:    Trish Mage, LCSW Phone Number: 10/23/2018, 3:10 PM  Clinical Narrative:    Ms Fleeger is seen due to recommendation of PT/OT for [facility] services.  She lives with her daughter who works during the day.  [facility] is coming in 4 hours a day for 6 days a week. This service just resumed in the past week as it was put on hold back in mid-Feb due to [place]. She uses a cane, has a shower chair and is checking into getting grab handles installed, and is interested in a 3 in 1 for over her commode because of the rails if it would fit in the space.  She gets her O2 from [place], and her flow rate is 2L.  She drives, and states that all her midications are affordable with the help of her PCP.  She had used [facility] in the past, and would like to use them again.  Will need order for [facility] PT, OT and RN at d/c.               Expected Discharge Plan: [place] Barriers to Discharge: No Barriers Identified   Patient Goals and CMS Choice Patient states their goals for this hospitalization and ongoing recovery are:: Wants to feel better, be more active. CMS Medicare.gov Compare Post Acute Care list provided to:: Patient Choice offered to / list presented to : Patient  Expected Discharge Plan and Services Expected Discharge Plan: [place]   Discharge Planning Services: CM Consult Post Acute Care Choice: [place] arrangements for the past 2 months: Single Family Home                                      Prior Living Arrangements/Services Living arrangements for the past 2 months: Single Family Home Lives with:: Self Patient language and need for interpreter reviewed:: Yes Do you feel safe going back to the place where you live?: Yes      Need for Family  Participation in Patient Care: Yes (Comment) Care giver support system in place?: Yes (comment) Current home services: DME Criminal Activity/Legal Involvement Pertinent to Current Situation/Hospitalization: No - Comment as needed  Activities of Daily Living Home Assistive Devices/Equipment: BIPAP, CBG Meter, Eyeglasses, Nebulizer, Cane (specify quad or straight)(single point cane) ADL Screening (condition at time of admission) Patient's cognitive ability adequate to safely complete daily activities?: Yes Is the patient deaf or have difficulty hearing?: No Does the patient have difficulty seeing, even when wearing glasses/contacts?: No Does the patient have difficulty concentrating, remembering, or making decisions?: No Patient able to express need for assistance with ADLs?: Yes Does the patient have difficulty dressing or bathing?: Yes Independently performs ADLs?: No Communication: Independent Dressing (OT): Needs assistance Is this a change from baseline?: Pre-admission baseline Grooming: Needs assistance Is this a change from baseline?: Pre-admission baseline Feeding: Independent Bathing: Needs assistance Is this a change from baseline?: Pre-admission baseline Toileting: Needs assistance Is this a change from baseline?: Pre-admission baseline In/Out Bed: Needs assistance Is this a change from baseline?: Pre-admission baseline Walks in Home: Needs assistance, Dependent Is this a change from baseline?: Pre-admission baseline Does the patient have difficulty walking or climbing stairs?: Yes(secondary  to shortness of breath) Weakness of Legs: Both Weakness of Arms/Hands: None  Permission Sought/Granted                  Emotional Assessment Appearance:: Appears stated age Attitude/Demeanor/Rapport: Engaged Affect (typically observed): Appropriate Orientation: : Oriented to Self, Oriented to Place, Oriented to  Time, Oriented to Situation Alcohol / Substance Use: Not  Applicable Psych Involvement: No (comment)  Admission diagnosis:  Acute on chronic respiratory failure with hypoxia (HCC) [J96.21] Patient Active Problem List   Diagnosis Date Noted  . HOCM (hypertrophic obstructive cardiomyopathy) (HCC)   . Essential hypertension   . Acute on chronic respiratory failure with hypoxia (HCC) 10/20/2018  . Acute hypercapnic respiratory failure (HCC) 07/21/2016  . Drug allergy 07/21/2016  . Morbid obesity with BMI of 60.0-69.9, adult (HCC) 07/21/2016  . Acute on chronic congestive heart failure (HCC) 07/19/2016  . Acute respiratory acidosis 07/19/2016  . Acute respiratory distress 07/19/2016  . Chronic obstructive pulmonary disease with acute exacerbation (HCC) 07/19/2016  . Hyperglycemia due to type 2 diabetes mellitus (HCC) 07/19/2016  . Leukocytosis 07/19/2016  . Chronic diastolic heart failure (HCC) 03/17/2016  . Hypertensive heart disease with heart failure (HCC) 03/17/2016  . Hypoxia 03/17/2016  . Aortic atherosclerosis (HCC) 03/17/2016  . Nonrheumatic aortic valve stenosis 03/17/2016  . Elevated troponin 08/24/2015  . SOB (shortness of breath) 08/24/2015  . Hypokalemia 08/24/2015  . Morbid obesity (HCC)   . Secondary hypertension   . Diabetes mellitus without complication (HCC)   . Asthma   . Hypomagnesemia   . Panniculitis 08/23/2015   PCP:  Jackie Plum, MD Pharmacy:   [facility] [id number] - [place], [place] - [address] AT [facility] [address] [place] [address] Phone: [phone] Fax: [phone]     Social Determinants of Health (SDOH) Interventions    Readmission Risk Interventions No flowsheet data found.

## 2018-10-23 NOTE — Progress Notes (Signed)
Occupational Therapy Treatment Patient Details Name: Morgan Fischer MRN: [id number] DOB: September 25, 1950 Today's Date: 10/23/2018    History of present illness 68 yo female admitted to ED on 10/18 with acute on chronic respiratory failure. PMH includes COPD on 2LO2 chronically, HF, DM, HLD, HTN, morbid obesity.   OT comments  Handout provided regarding energy conservation.  Pt will have aide and HHOT as well as daughter to A at home  Follow Up Recommendations  Home health OT;Supervision/Assistance - 24 hour    Equipment Recommendations  3 n 1   Recommendations for Other Services      Precautions / Restrictions Precautions Precautions: Fall Precaution Comments: watch sats Restrictions Weight Bearing Restrictions: No       Mobility Bed Mobility Overal bed mobility: Needs Assistance Bed Mobility: Sit to Supine     Supine to sit: Min assist        Transfers Overall transfer level: Needs assistance Equipment used: None;Rolling walker (2 wheeled) Transfers: Sit to/from [employer] Sit to Stand: Min guard;From elevated surface Stand pivot transfers: Min assist       General transfer comment: Vc for hand safety    Balance Overall balance assessment: Mild deficits observed, not formally tested                                         ADL either performed or assessed with clinical judgement   ADL Overall ADL's : Needs assistance/impaired     Grooming: Wash/dry hands;Standing;Supervision/safety               Lower Body Dressing: Minimal assistance;Sit to/from stand;Cueing for safety;Cueing for sequencing Lower Body Dressing Details (indicate cue type and reason): socks Toilet Transfer: Min guard;Cueing for safety;Stand-pivot;Cueing for sequencing   Toileting- Clothing Manipulation and Hygiene: Sit to/from stand;Cueing for safety;Cueing for sequencing;Supervision/safety         General ADL Comments: Provided pt a handout on  energy conservation and went over in detail.  Pt doing well this day and appreciative of education     Vision Baseline Vision/History: No visual deficits            Cognition Arousal/Alertness: Awake/alert Behavior During Therapy: WFL for tasks assessed/performed Overall Cognitive Status: Within Functional Limits for tasks assessed                                                     Pertinent Vitals/ Pain       Pain Assessment: No/denies pain     Prior Functioning/Environment              Frequency  Min 2X/week        Progress Toward Goals  OT Goals(current goals can now be found in the care plan section)  Progress towards OT goals: Progressing toward goals     Plan         AM-PAC OT "6 Clicks" Daily Activity     Outcome Measure   Help from another person eating meals?: None Help from another person taking care of personal grooming?: A Little Help from another person toileting, which includes using toliet, bedpan, or urinal?: A Little Help from another person bathing (including washing, rinsing, drying)?: A Little Help from another person to put on  and taking off regular upper body clothing?: A Little Help from another person to put on and taking off regular lower body clothing?: A Lot 6 Click Score: 18    End of Session Equipment Utilized During Treatment: Rolling walker  OT Visit Diagnosis: Unsteadiness on feet (R26.81);Muscle weakness (generalized) (M62.81);Other abnormalities of gait and mobility (R26.89)   Activity Tolerance Patient tolerated treatment well   Patient Left in chair   Nurse Communication Mobility status        Time: 8466-5993 OT Time Calculation (min): 24 min  Charges: OT General Charges $OT Visit: 1 Visit OT Treatments $Self Care/Home Management : 23-37 mins  Kari Baars, [place] [phone] Office- [place], Edwena Felty D 10/23/2018, 12:41  PM

## 2018-10-24 ENCOUNTER — Other Ambulatory Visit: Payer: Self-pay | Admitting: Student

## 2018-10-24 DIAGNOSIS — I442 Atrioventricular block, complete: Secondary | ICD-10-CM

## 2018-10-24 DIAGNOSIS — I5032 Chronic diastolic (congestive) heart failure: Secondary | ICD-10-CM

## 2018-10-24 DIAGNOSIS — Z23 Encounter for immunization: Secondary | ICD-10-CM | POA: Diagnosis not present

## 2018-10-24 DIAGNOSIS — I421 Obstructive hypertrophic cardiomyopathy: Secondary | ICD-10-CM

## 2018-10-24 LAB — CBC WITH DIFFERENTIAL/PLATELET
Abs Immature Granulocytes: 0.03 10*3/uL (ref 0.00–0.07)
Basophils Absolute: 0 10*3/uL (ref 0.0–0.1)
Basophils Relative: 0 %
Eosinophils Absolute: 0 10*3/uL (ref 0.0–0.5)
Eosinophils Relative: 1 %
HCT: 33.6 % — ABNORMAL LOW (ref 36.0–46.0)
Hemoglobin: 9.8 g/dL — ABNORMAL LOW (ref 12.0–15.0)
Immature Granulocytes: 0 %
Lymphocytes Relative: 46 %
Lymphs Abs: 3.3 10*3/uL (ref 0.7–4.0)
MCH: 27 pg (ref 26.0–34.0)
MCHC: 29.2 g/dL — ABNORMAL LOW (ref 30.0–36.0)
MCV: 92.6 fL (ref 80.0–100.0)
Monocytes Absolute: 0.7 10*3/uL (ref 0.1–1.0)
Monocytes Relative: 10 %
Neutro Abs: 3.1 10*3/uL (ref 1.7–7.7)
Neutrophils Relative %: 43 %
Platelets: 125 10*3/uL — ABNORMAL LOW (ref 150–400)
RBC: 3.63 MIL/uL — ABNORMAL LOW (ref 3.87–5.11)
RDW: 15.9 % — ABNORMAL HIGH (ref 11.5–15.5)
WBC: 7.2 10*3/uL (ref 4.0–10.5)
nRBC: 0 % (ref 0.0–0.2)

## 2018-10-24 LAB — COMPREHENSIVE METABOLIC PANEL
ALT: 22 U/L (ref 0–44)
AST: 20 U/L (ref 15–41)
Albumin: 3.4 g/dL — ABNORMAL LOW (ref 3.5–5.0)
Alkaline Phosphatase: 42 U/L (ref 38–126)
Anion gap: 8 (ref 5–15)
BUN: 38 mg/dL — ABNORMAL HIGH (ref 8–23)
CO2: 35 mmol/L — ABNORMAL HIGH (ref 22–32)
Calcium: 8.8 mg/dL — ABNORMAL LOW (ref 8.9–10.3)
Chloride: 97 mmol/L — ABNORMAL LOW (ref 98–111)
Creatinine, Ser: 0.88 mg/dL (ref 0.44–1.00)
GFR calc Af Amer: >60 mL/min (ref >60)
GFR calc non Af Amer: >60 mL/min (ref >60)
Glucose, Bld: 116 mg/dL — ABNORMAL HIGH (ref 70–99)
Potassium: 4 mmol/L (ref 3.5–5.1)
Sodium: 140 mmol/L (ref 135–145)
Total Bilirubin: 0.5 mg/dL (ref 0.3–1.2)
Total Protein: 5.7 g/dL — ABNORMAL LOW (ref 6.5–8.1)

## 2018-10-24 LAB — MAGNESIUM: Magnesium: 2 mg/dL (ref 1.7–2.4)

## 2018-10-24 LAB — GLUCOSE, CAPILLARY
Glucose-Capillary: 115 mg/dL — ABNORMAL HIGH (ref 70–99)
Glucose-Capillary: 170 mg/dL — ABNORMAL HIGH (ref 70–99)
Glucose-Capillary: 191 mg/dL — ABNORMAL HIGH (ref 70–99)

## 2018-10-24 MED ORDER — ATENOLOL 50 MG PO TABS: 50.0000 mg | ORAL_TABLET | Freq: Every day | ORAL | 0 refills | Status: DC

## 2018-10-24 MED ORDER — POLYETHYLENE GLYCOL 3350 17 G PO PACK: 17.0000 g | PACK | Freq: Every day | ORAL | 0 refills | Status: DC

## 2018-10-24 MED ORDER — ATENOLOL 25 MG PO TABS
50.0000 mg | ORAL_TABLET | Freq: Every day | ORAL | Status: DC
  Administered 2018-10-24: 50 mg via ORAL
  Filled 2018-10-24: qty 2

## 2018-10-24 MED ORDER — FUROSEMIDE 40 MG PO TABS
80.0000 mg | ORAL_TABLET | Freq: Two times a day (BID) | ORAL | Status: DC
  Administered 2018-10-24: 80 mg via ORAL
  Filled 2018-10-24: qty 2

## 2018-10-24 MED ORDER — ASPIRIN 81 MG PO TBEC: 81.0000 mg | DELAYED_RELEASE_TABLET | Freq: Every day | ORAL | 0 refills | Status: DC

## 2018-10-24 MED ORDER — FUROSEMIDE 40 MG PO TABS: 80.0000 mg | ORAL_TABLET | Freq: Two times a day (BID) | ORAL | 0 refills | Status: DC

## 2018-10-24 NOTE — Progress Notes (Signed)
Progress Note  Patient Name: Morgan Fischer Date of Encounter: 10/24/2018  Primary Cardiologist: Dr. Dot Been   Subjective   No significant overnight events. Patient feeling well today. Breathing continues to improve. She feels about the same as she did yesterday. She denies any chest pain or palpitations.  Inpatient Medications    Scheduled Meds: . allopurinol  300 mg Oral Daily  . aspirin  81 mg Oral Daily  . atenolol  100 mg Oral Daily  . calcium-vitamin D  1 tablet Oral BID  . doxycycline  100 mg Oral Q12H  . enoxaparin (LOVENOX) injection  60 mg Subcutaneous Q24H  . furosemide  80 mg Intravenous BID  . insulin aspart  0-20 Units Subcutaneous TID WC  . insulin aspart  0-5 Units Subcutaneous QHS  . insulin aspart  6 Units Subcutaneous TID WC  . insulin glargine  15 Units Subcutaneous Daily  . ipratropium-albuterol  3 mL Nebulization BID  . losartan  100 mg Oral Daily  . mouth rinse  15 mL Mouth Rinse BID  . mometasone-formoterol  2 puff Inhalation BID  . montelukast  10 mg Oral QHS  . polyethylene glycol  17 g Oral Daily  . rosuvastatin  40 mg Oral QHS  . senna-docusate  2 tablet Oral BID   Continuous Infusions:  PRN Meds: acetaminophen **OR** acetaminophen, albuterol, HYDROcodone-acetaminophen, ipratropium-albuterol, nitroGLYCERIN, ondansetron **OR** ondansetron (ZOFRAN) IV, senna-docusate   Vital Signs    Vitals:   10/24/18 0843 10/24/18 0910 10/24/18 0935 10/24/18 1400  BP:  (!) 148/56  (!) 120/57  Pulse:  (!) 49  61  Resp:  16 17 19   Temp:    97.9 F (36.6 C)  TempSrc:    Temporal  SpO2: 97% 100%  100%  Weight:      Height:        Intake/Output Summary (Last 24 hours) at 10/24/2018 1531 Last data filed at 10/24/2018 0300 Gross per 24 hour  Intake -  Output 950 ml  Net -950 ml   Last 3 Weights 10/24/2018 10/23/2018 10/22/2018  Weight (lbs) 293 lb 3.4 oz 292 lb 1.8 oz 294 lb 1.5 oz  Weight (kg) 133 kg 132.5 kg 133.4 kg      Telemetry     Alternating between sinus rhythm and what looks most consistent with accelerated idioventricular rhythm . Wide QRS with rates in 50's to 60's. Sometimes looks like possible junctional rhythm with retrograde P wave.  - Personally Reviewed  ECG    No new ECG tracing today. - Personally Reviewed  Physical Exam   GEN: Morbidly obese female in acute distress.   Neck: Supple. Difficult to assess JVD due to body habitus. Cardiac: RRR. II-III/VI systolic murmur best heard at upper sternal border. No rubs or gallops. Respiratory: No increased work of breathing. Possible mild crackles in bases but difficult to tell due to body habitus. Lungs mostly clear.  GI: Soft, non-distended, and non-tender.  MS: 1+ edema of bilaterally lower extremities. No deformity. Neuro:  No focal deficits. Psych: Normal affect. Responds appropriately.  Labs    High Sensitivity Troponin:   Recent Labs  Lab 10/20/18 1828 10/21/18 0957  TROPONINIHS 18* 14      Chemistry Recent Labs  Lab 10/22/18 0519 10/23/18 0541 10/24/18 0559  NA 142 142 140  K 3.7 3.4* 4.0  CL 98 97* 97*  CO2 34* 34* 35*  GLUCOSE 241* 121* 116*  BUN 27* 34* 38*  CREATININE 0.85 0.84 0.88  CALCIUM 9.0 8.9  8.8*  PROT  --   --  5.7*  ALBUMIN  --   --  3.4*  AST  --   --  20  ALT  --   --  22  ALKPHOS  --   --  42  BILITOT  --   --  0.5  GFRNONAA >60 >60 >60  GFRAA >60 >60 >60  ANIONGAP 10 11 8      Hematology Recent Labs  Lab 10/20/18 1828 10/22/18 0519 10/24/18 0559  WBC 7.2 9.3 7.2  RBC 4.21 3.61* 3.63*  HGB 11.3* 9.7* 9.8*  HCT 38.8 32.9* 33.6*  MCV 92.2 91.1 92.6  MCH 26.8 26.9 27.0  MCHC 29.1* 29.5* 29.2*  RDW 15.7* 15.4 15.9*  PLT 149* 122* 125*    BNP Recent Labs  Lab 10/20/18 1828  BNP 671.3*     DDimer  Recent Labs  Lab 10/21/18 0957  DDIMER 0.43     Radiology    No results found.  Cardiac Studies   Echocardiogram 10/21/2018: Impressions:  1. Left ventricular ejection fraction, by  visual estimation, is 60 to 65%. The left ventricle has normal function. Normal left ventricular size. There is severely increased left ventricular hypertrophy.  2. Chordal SAM with small LVOT gradient with severe LVH findings may be consistent with HOCM.  3. Global right ventricle has normal systolic function.The right ventricular size is normal. No increase in right ventricular wall thickness.  4. Left atrial size was moderately dilated.  5. Right atrial size was normal.  6. Moderate mitral annular calcification.  7. The mitral valve is normal in structure. Trace mitral valve regurgitation. No evidence of mitral stenosis.  8. The tricuspid valve is normal in structure. Tricuspid valve regurgitation is mild.  9. The aortic valve was not well visualized Aortic valve regurgitation was not visualized by color flow Doppler. Mild aortic valve stenosis. 10. The pulmonic valve was normal in structure. Pulmonic valve regurgitation is mild by color flow Doppler. 11. Moderately elevated pulmonary artery systolic pressure. 12. The inferior vena cava is normal in size with greater than 50% respiratory variability, suggesting right atrial pressure of 3 mmHg.  Patient Profile     Hollie BeachJulia Goerner is a 68 y.o. female with a history of CAD noted on chest CT in 2018, chronic diastolic CHF/ HOCM, moderate aortic stenosis, COPD and asthma on 2 L of home O2, hypertension, hyperlipidemia, type 2 diabetes mellitus, and morbid obesity with BMI of 51.28 who is being seen today for the evaluation of acute on chronic diastolic CHF at the request of Dr. Nelson ChimesAmin.  Assessment & Plan    Acute on Chronic Diastolic CHF/ HOCM - Patient presented with worsening shortness of breath. - Chest x-ray showed no overt edema. - BNP elevated in the 600's. Likely higher given morbid obesity. - Echo showed LVEF of 60-65% with severe LVH. Chordal SAM with small LVOT graadient with severe LVH findings consistent with HOCM. Also showed moderately  dilated left atrial size, trace MR, mild TR, mild AS, mild PR, and moderately elevated PASP. - Currently on IV Lasix increase to 80mg  twice daily Documented urinary output of 1.6 L in the past 24 hours and net negative 1.9 L since admission. Weight up 1 lb from yesterday (unsure if this is accurate). Renal function stable. - It is difficult to adequately assess volume status due to patient's body habitus. But may be able to transition to PO Lasix. - Continue BP control. - Continue to monitor daily weights, strict I/O's,  and renal function.  CAD - She had Myoview in 2016 which was non-conclusive due to body habitus but showed possible mild ischemia. Chest CT in 2018 noted coronary artery calcifications.  - EKG shows T wave changes in lateral leads but this is unchanged from 2017. - High-sensitivity troponin borderline flat at 18 >> 14. - Patient denies any angina. - Currently on Aspirin 325mg  daily which she states she has been taken for a long time. No history of stroke and see no indication for this high dose so will reduce to 81mg  daily. Continue beta-blocker and home statin.   Accelerated Idioventricular Rhythm / Short Episode of Complete Heart Block - Per review of telemetry, patient often in an accelerated idioventricular rhythm (no P waves and wide QRS) with rate in the 50's to 60's. She also had an episode of complete heart block around 3 am likely due to sleep apnea. - Will reduce Atenolol to 50mg  daily. - Discussed with Dr. Oval Linsey - patient would benefit from EP evaluation. This does not seem to be an acute issue so can be done as outpatient.   Hypertension - BP well controlled at 120/57. - Home medications include Hydralazine 25mg  three times daily, Atenolol 100mg  daily, and Losartan 100mg  daily. Currently on home Atenolol and Losartan but no on Hydralazine here.  - Will decrease Atenolol as above. - Given HOCM, need to be cautious and not drop BP too low.   Hypokalemia -  Potassium 3.4 today after increase in Lasix. - On K-Dur 40 mEq twice daily. Potassium improved at 4.0 today.  Hyperlipidemia - Continue home Crestor.  Type 2 Diabetes Mellitus - Hemoglobin A1c 7.9 this admission.  Morbid Obesity - BMI 51.28. - Discussed importance of weight loss.  - Consulted Nutrition yesterday.  Sleep Apnea - Compliant with BiPAP at night.   Anemia - Hemoglobin stable at 9.8 today. - Patient denies any abnormal bleeding including hematochezia, melena, and hematuria. - Will defer additional work-up to primary team.   For questions or updates, please contact [facility] Please consult [email or web address] for contact info under        Signed, Darreld Mclean, PA-C  10/24/2018, 3:31 PM

## 2018-10-24 NOTE — Plan of Care (Signed)
  Problem: Education: Goal: Knowledge of General Education information will improve Description: Including pain rating scale, medication(s)/side effects and non-pharmacologic comfort measures Outcome: Progressing   Problem: Clinical Measurements: Goal: Respiratory complications will improve Outcome: Progressing Goal: Cardiovascular complication will be avoided Outcome: Progressing   Problem: Activity: Goal: Risk for activity intolerance will decrease Outcome: Progressing   Problem: Nutrition: Goal: Adequate nutrition will be maintained Outcome: Progressing   Problem: Elimination: Goal: Will not experience complications related to urinary retention Outcome: Progressing   Problem: Safety: Goal: Ability to remain free from injury will improve Outcome: Progressing   

## 2018-10-24 NOTE — Progress Notes (Signed)
Student nurse I agree with this assessment  Lavonna Rua MSN,RN

## 2018-10-24 NOTE — Progress Notes (Signed)
Removed PT from BiPAP and placed on 8 lpm neb tx. Will place on 2 lpm nasal cannula post neb (uses 2-3 lpm at home).

## 2018-10-24 NOTE — Care Management Important Message (Signed)
Important Message  Patient Details IM Letter given to Velva Harman RN to present to the Patient Name: Morgan Fischer MRN: [id number] Date of Birth: 1950-10-31   Medicare Important Message Given:  Yes     Kerin Salen 10/24/2018, 2:24 PM

## 2018-10-27 DIAGNOSIS — J449 Chronic obstructive pulmonary disease, unspecified: Secondary | ICD-10-CM | POA: Diagnosis not present

## 2018-10-27 DIAGNOSIS — E785 Hyperlipidemia, unspecified: Secondary | ICD-10-CM | POA: Diagnosis not present

## 2018-10-27 DIAGNOSIS — J9621 Acute and chronic respiratory failure with hypoxia: Secondary | ICD-10-CM | POA: Diagnosis not present

## 2018-10-27 DIAGNOSIS — I350 Nonrheumatic aortic (valve) stenosis: Secondary | ICD-10-CM | POA: Diagnosis not present

## 2018-10-27 DIAGNOSIS — I5033 Acute on chronic diastolic (congestive) heart failure: Secondary | ICD-10-CM | POA: Diagnosis not present

## 2018-10-27 DIAGNOSIS — E1165 Type 2 diabetes mellitus with hyperglycemia: Secondary | ICD-10-CM | POA: Diagnosis not present

## 2018-10-27 DIAGNOSIS — I2510 Atherosclerotic heart disease of native coronary artery without angina pectoris: Secondary | ICD-10-CM | POA: Diagnosis not present

## 2018-10-27 DIAGNOSIS — I110 Hypertensive heart disease with heart failure: Secondary | ICD-10-CM | POA: Diagnosis not present

## 2018-10-27 DIAGNOSIS — J9622 Acute and chronic respiratory failure with hypercapnia: Secondary | ICD-10-CM | POA: Diagnosis not present

## 2018-10-28 DIAGNOSIS — J449 Chronic obstructive pulmonary disease, unspecified: Secondary | ICD-10-CM | POA: Diagnosis not present

## 2018-10-28 NOTE — Discharge Summary (Signed)
[facility] Discharge Summary   Patient: Morgan Fischer HBZ:[id number]   PCP: Jackie Plum, MD DOB: 01/12/1950   Date of admission: 10/20/2018   Date of discharge: 10/24/2018     Discharge Diagnoses:  Principal Problem:   Acute on chronic respiratory failure with hypoxia [facility]) Active Problems:   Morbid obesity (HCC)   Chronic diastolic heart failure (HCC)   Hypertensive heart disease with heart failure (HCC)   Chronic obstructive pulmonary disease with acute exacerbation (HCC)   Hyperglycemia due to type 2 diabetes mellitus (HCC)   HOCM (hypertrophic obstructive cardiomyopathy) (HCC)   Essential hypertension   Heart block AV complete (HCC)   Admitted From: Home Disposition:  Home   Recommendations for Outpatient Follow-up:  1. PCP: Please follow-up with PCP and cardiology as recommended 2. Follow up LABS/TEST: Repeat BMP in 1 week  Follow-up Information    [facility] [place] Follow up.   Specialty: Cardiology Why: You have a follow-up appointment with Dr. Tomie China in our [facility] office on Friday 11/01/2018 at 10:00am. Please arrive 15 minute early for check-in.  Contact information: [address] [place] [address] [phone]       Jackie Plum, MD. Schedule an appointment as soon as possible for a visit in 1 week(s).   Specialty: Internal Medicine Why: with repeat BMP Contact information: [address] [place] [address] [phone]          Diet recommendation: Carb modified diet  Activity: The patient is advised to gradually reintroduce usual activities,as tolerated .  Discharge Condition: good  Code Status: Full code   History of present illness: As per the H and P dictated on admission, "Morgan Fischer is a 68 y.o. female with medical history significant for asthma/COPD oxygen dependent on 2L of O2, CHF, diabetes, hypertension, hyperlipidemia, morbid obesity, presents with  worsening shortness of breath and worsening leg swelling for the past couple of days.  Patient reports worsening shortness of breath despite trying all her home nebulizers/inhalers without improvement.  Patient denies any fever/chills, cough, chest pain, abdominal pain, nausea/vomiting, diarrhea.   ED Course: Patient was noted to be febrile, tachypneic to the low 30s, now requiring about 4 L of O2, hypertensive, labs show no leukocytosis, BNP 671, troponin 18, EKG with ST changes, with prolongated QTC, chest x-ray with mild atelectasis on the right base with no overt edema.  COVID-19 negative.  Patient was treated with IV Lasix and Solu-Medrol.  Patient transferred to [facility] long for admission for further management."  Hospital Course:  Summary of her active problems in the hospital is as following. Acute on chronic hypoxic respiratory distress, multifactorial -Shortness of breath is combination of fluid overload secondary to diastolic CHF and COPD exacerbation. -COVID-19-negative.  D-dimer-negative -Incentive spirometer to help with atelectasis Oxygenation back to baseline  Acute diastolic congestive heart failure with ejection fraction 60%, grade 2 diastolic dysfunction, class III Hypertrophic obstructive cardiomyopathy -proBNP falsely low due to her body habitus. -Echocardiogram 10/21/2018-ejection fraction 60 to 65%, severe LVH with concerns for HOCM.  Mild mitral aortic valve stenosis.  Moderate elevated pulmonary arterial systolic pressures. -Difficult to assess volume status but will give her IV Lasix to bring her down to euvolemic or slightly drier side.  Monitor urine output. -Daily weights -Consult cardiology for appropriate med management Continue oral diuretics and heart  Acute mild COPD exacerbation with possible underlying bronchitis -Bronchodilators.  Discontinue Solu-Medrol -Doxycycline completed in the hospital  Diabetes mellitus type  2, insulin-dependent -Lantus 15  units daily.    Continue home regimen -Hemoglobin A1c 7.9  Essential hypertension -Continue current regimen.  Hyperlipidemia -Crestor 40 mg at bedtime  Morbid obesity with BMI greater than 50. -Needs weight loss diet and exercise.  Dietitian consulted  Patient was seen by physical therapy, who recommended Home health, which was arranged by case manager. On the day of the discharge the patient's vitals were stable, and no other acute medical condition were reported by patient. the patient was felt safe to be discharge at Home with Home health.  Consultants: Cardiology  Procedures: none  DISCHARGE MEDICATION: Allergies as of 10/24/2018      Reactions   Atorvastatin Swelling   Lip swelling   Ciprocinonide [fluocinolone] Other (See Comments)   Kidney failure   Catapres [clonidine Hcl] Rash   Demadex [torsemide] Rash      Medication List    STOP taking these medications   hydrALAZINE 25 MG tablet Commonly known as: APRESOLINE   ibuprofen 200 MG tablet Commonly known as: ADVIL     TAKE these medications   allopurinol 300 MG tablet Commonly known as: ZYLOPRIM Take 300 mg by mouth daily.   aspirin 81 MG EC tablet Take 1 tablet (81 mg total) by mouth daily. What changed:   medication strength  how much to take   atenolol 50 MG tablet Commonly known as: TENORMIN Take 1 tablet (50 mg total) by mouth daily. What changed:   medication strength  how much to take   Calcium-Vitamin D3 500-400 MG-UNIT Tabs Take 1 tablet by mouth 2 (two) times daily.   Fish Oil 1000 MG Caps Take 1,000 mg by mouth 2 (two) times daily.   Fluticasone-Salmeterol 250-50 MCG/DOSE Aepb Commonly known as: ADVAIR Inhale 1 puff into the lungs 2 (two) times daily.   furosemide 40 MG tablet Commonly known as: LASIX Take 2 tablets (80 mg total) by mouth 2 (two) times daily. What changed: how much to take   Garlic 1000 MG Caps Take 1,000 mg by mouth daily.   glimepiride 4 MG  tablet Commonly known as: AMARYL Take 4 mg by mouth daily.   ipratropium-albuterol 0.5-2.5 (3) MG/3ML Soln Commonly known as: DUONEB Use 1 vial in nebulizer every 4 hours as needed   losartan 100 MG tablet Commonly known as: COZAAR Take 1 tablet by mouth daily.   metFORMIN 500 MG 24 hr tablet Commonly known as: GLUCOPHAGE-XR Take 500 mg by mouth 2 (two) times daily.   montelukast 10 MG tablet Commonly known as: SINGULAIR Take 10 mg by mouth at bedtime.   multivitamin with minerals Tabs tablet Take 1 tablet by mouth daily.   nitroGLYCERIN 0.4 MG SL tablet Commonly known as: NITROSTAT Place 1 tablet (0.4 mg total) under the tongue every 5 (five) minutes as needed for chest pain.   polyethylene glycol 17 g packet Commonly known as: MIRALAX / GLYCOLAX Take 17 g by mouth daily.   QC Tumeric Complex 500 MG Caps Generic drug: Turmeric Take 1 capsule by mouth daily.   rosuvastatin 40 MG tablet Commonly known as: CRESTOR Take 40 mg by mouth daily.   VITAMIN B-12 PO Take 1 tablet by mouth daily.   Vitamin D3 50 MCG (2000 UT) capsule Take 1 capsule by mouth daily.      Allergies  Allergen Reactions   Atorvastatin Swelling    Lip swelling   Ciprocinonide [Fluocinolone] Other (See Comments)    Kidney failure   Catapres [Clonidine Hcl] Rash  Demadex [Torsemide] Rash   Discharge Instructions    Diet - low sodium heart healthy   Complete by: As directed    Increase activity slowly   Complete by: As directed      Discharge Exam: Filed Weights   10/22/18 0500 10/23/18 0529 10/24/18 0500  Weight: 133.4 kg 132.5 kg 133 kg   Vitals:   10/24/18 0935 10/24/18 1400  BP:  (!) 120/57  Pulse:  61  Resp: 17 19  Temp:  97.9 F (36.6 C)  SpO2:  100%   General: Appear in mild distress, no Rash; Oral Mucosa Clear, moist. no Abnormal Mass Or lumps Cardiovascular: S1 and S2 Present, no Murmur, Respiratory: normal respiratory effort, Bilateral Air entry present and  Clear to Auscultation, no Crackles, no wheezes Abdomen: Bowel Sound present, Soft and no tenderness, no hernia Extremities: no Pedal edema, no calf tenderness Neurology: alert and oriented to time, place, and person affect appropriate.  The results of significant diagnostics from this hospitalization (including imaging, microbiology, ancillary and laboratory) are listed below for reference.    Significant Diagnostic Studies: Dg Chest Port 1 View  Result Date: 10/20/2018 CLINICAL DATA:  Increased shortness of breath. EXAM: PORTABLE CHEST 1 VIEW COMPARISON:  Multiple chest x-rays since October 2009. FINDINGS: Fullness in the right hilum is identified. This is more prominent compared to previous studies but was similar when compared to November 2018. Cardiomegaly. Left hilum is largely obscured due to patient rotation. No pneumothorax. No overt edema or focal infiltrate. IMPRESSION: 1. Increased prominence of the right hilum is probably due to patient rotation. A CT scan from July 2018 demonstrated an enlarged pulmonary artery consistent with pulmonary arterial hypertension. 2. Mild atelectasis in the right base.  No overt edema. 3. No other acute abnormalities. Electronically Signed   By: Dorise Bullion III M.D   On: 10/20/2018 18:49    Microbiology: Recent Results (from the past 240 hour(s))  SARS Coronavirus 2 by RT PCR (hospital order, performed in [facility] hospital lab) Nasopharyngeal Nasopharyngeal Swab     Status: None   Collection Time: 10/20/18  6:28 PM   Specimen: Nasopharyngeal Swab  Result Value Ref Range Status   SARS Coronavirus 2 NEGATIVE NEGATIVE Final    Comment: (NOTE) If result is NEGATIVE SARS-CoV-2 target nucleic acids are NOT DETECTED. The SARS-CoV-2 RNA is generally detectable in upper and lower  respiratory specimens during the acute phase of infection. The lowest  concentration of SARS-CoV-2 viral copies this assay can detect is 250  copies / mL. A negative  result does not preclude SARS-CoV-2 infection  and should not be used as the sole basis for treatment or other  patient management decisions.  A negative result may occur with  improper specimen collection / handling, submission of specimen other  than nasopharyngeal swab, presence of viral mutation(s) within the  areas targeted by this assay, and inadequate number of viral copies  (<250 copies / mL). A negative result must be combined with clinical  observations, patient history, and epidemiological information. If result is POSITIVE SARS-CoV-2 target nucleic acids are DETECTED. The SARS-CoV-2 RNA is generally detectable in upper and lower  respiratory specimens dur ing the acute phase of infection.  Positive  results are indicative of active infection with SARS-CoV-2.  Clinical  correlation with patient history and other diagnostic information is  necessary to determine patient infection status.  Positive results do  not rule out bacterial infection or co-infection with other viruses. If result is PRESUMPTIVE  POSTIVE SARS-CoV-2 nucleic acids MAY BE PRESENT.   A presumptive positive result was obtained on the submitted specimen  and confirmed on repeat testing.  While 2019 novel coronavirus  (SARS-CoV-2) nucleic acids may be present in the submitted sample  additional confirmatory testing may be necessary for epidemiological  and / or clinical management purposes  to differentiate between  SARS-CoV-2 and other Sarbecovirus currently known to infect humans.  If clinically indicated additional testing with an alternate test  methodology [phone]) is advised. The SARS-CoV-2 RNA is generally  detectable in upper and lower respiratory sp ecimens during the acute  phase of infection. The expected result is Negative. Fact Sheet for Patients:  BoilerBrush.com.cy Fact Sheet for Healthcare Providers: https://pope.com/ This test is not yet  approved or cleared by the [place] FDA and has been authorized for detection and/or diagnosis of SARS-CoV-2 by FDA under an Emergency Use Authorization (EUA).  This EUA will remain in effect (meaning this test can be used) for the duration of the COVID-19 declaration under Section 564(b)(1) of the Act, 21 U.S.C. section 360bbb-3(b)(1), unless the authorization is terminated or revoked sooner. Performed at [facility], [address] [place], [place] [address]      Labs: CBC: Recent Labs  Lab 10/22/18 0519 10/24/18 0559  WBC 9.3 7.2  NEUTROABS  --  3.1  HGB 9.7* 9.8*  HCT 32.9* 33.6*  MCV 91.1 92.6  PLT 122* 125*   Basic Metabolic Panel: Recent Labs  Lab 10/22/18 0519 10/22/18 1052 10/23/18 0541 10/24/18 0559  NA 142  --  142 140  K 3.7  --  3.4* 4.0  CL 98  --  97* 97*  CO2 34*  --  34* 35*  GLUCOSE 241*  --  121* 116*  BUN 27*  --  34* 38*  CREATININE 0.85  --  0.84 0.88  CALCIUM 9.0  --  8.9 8.8*  MG  --  2.1  --  2.0   Liver Function Tests: Recent Labs  Lab 10/24/18 0559  AST 20  ALT 22  ALKPHOS 42  BILITOT 0.5  PROT 5.7*  ALBUMIN 3.4*   No results for input(s): LIPASE, AMYLASE in the last 168 hours. No results for input(s): AMMONIA in the last 168 hours. Cardiac Enzymes: No results for input(s): CKTOTAL, CKMB, CKMBINDEX, TROPONINI in the last 168 hours. BNP (last 3 results) Recent Labs    10/20/18 1828  BNP 671.3*   CBG: Recent Labs  Lab 10/23/18 1629 10/23/18 2007 10/24/18 0728 10/24/18 1147 10/24/18 1631  GLUCAP 198* 172* 115* 170* 191*    Time spent: 35 minutes  Signed:  Lynden Oxford  [facility] 10/24/2018 8:29 AM

## 2018-10-29 DIAGNOSIS — J45909 Unspecified asthma, uncomplicated: Secondary | ICD-10-CM | POA: Diagnosis not present

## 2018-10-29 DIAGNOSIS — J449 Chronic obstructive pulmonary disease, unspecified: Secondary | ICD-10-CM | POA: Diagnosis not present

## 2018-10-30 DIAGNOSIS — J9621 Acute and chronic respiratory failure with hypoxia: Secondary | ICD-10-CM | POA: Diagnosis not present

## 2018-10-30 DIAGNOSIS — Z0001 Encounter for general adult medical examination with abnormal findings: Secondary | ICD-10-CM | POA: Diagnosis not present

## 2018-10-30 DIAGNOSIS — Z136 Encounter for screening for cardiovascular disorders: Secondary | ICD-10-CM | POA: Diagnosis not present

## 2018-10-30 DIAGNOSIS — I5033 Acute on chronic diastolic (congestive) heart failure: Secondary | ICD-10-CM | POA: Diagnosis not present

## 2018-10-30 DIAGNOSIS — E1165 Type 2 diabetes mellitus with hyperglycemia: Secondary | ICD-10-CM | POA: Diagnosis not present

## 2018-10-30 DIAGNOSIS — I350 Nonrheumatic aortic (valve) stenosis: Secondary | ICD-10-CM | POA: Diagnosis not present

## 2018-10-30 DIAGNOSIS — I110 Hypertensive heart disease with heart failure: Secondary | ICD-10-CM | POA: Diagnosis not present

## 2018-10-30 DIAGNOSIS — E611 Iron deficiency: Secondary | ICD-10-CM | POA: Diagnosis not present

## 2018-10-30 DIAGNOSIS — J9622 Acute and chronic respiratory failure with hypercapnia: Secondary | ICD-10-CM | POA: Diagnosis not present

## 2018-10-30 DIAGNOSIS — I10 Essential (primary) hypertension: Secondary | ICD-10-CM | POA: Diagnosis not present

## 2018-10-30 DIAGNOSIS — Z1329 Encounter for screening for other suspected endocrine disorder: Secondary | ICD-10-CM | POA: Diagnosis not present

## 2018-10-30 DIAGNOSIS — I2510 Atherosclerotic heart disease of native coronary artery without angina pectoris: Secondary | ICD-10-CM | POA: Diagnosis not present

## 2018-10-30 DIAGNOSIS — E785 Hyperlipidemia, unspecified: Secondary | ICD-10-CM | POA: Diagnosis not present

## 2018-10-30 DIAGNOSIS — J449 Chronic obstructive pulmonary disease, unspecified: Secondary | ICD-10-CM | POA: Diagnosis not present

## 2018-10-30 DIAGNOSIS — I119 Hypertensive heart disease without heart failure: Secondary | ICD-10-CM | POA: Diagnosis not present

## 2018-10-30 DIAGNOSIS — Z23 Encounter for immunization: Secondary | ICD-10-CM | POA: Diagnosis not present

## 2018-10-30 DIAGNOSIS — E119 Type 2 diabetes mellitus without complications: Secondary | ICD-10-CM | POA: Diagnosis not present

## 2018-11-01 ENCOUNTER — Ambulatory Visit (INDEPENDENT_AMBULATORY_CARE_PROVIDER_SITE_OTHER): Payer: Medicare HMO | Admitting: Cardiology

## 2018-11-01 ENCOUNTER — Encounter: Payer: Self-pay | Admitting: Cardiology

## 2018-11-01 ENCOUNTER — Other Ambulatory Visit: Payer: Self-pay

## 2018-11-01 VITALS — BP 122/56 | HR 76 | Ht 63.0 in | Wt 291.0 lb

## 2018-11-01 DIAGNOSIS — I421 Obstructive hypertrophic cardiomyopathy: Secondary | ICD-10-CM | POA: Diagnosis not present

## 2018-11-01 DIAGNOSIS — I10 Essential (primary) hypertension: Secondary | ICD-10-CM | POA: Diagnosis not present

## 2018-11-01 DIAGNOSIS — I350 Nonrheumatic aortic (valve) stenosis: Secondary | ICD-10-CM

## 2018-11-01 HISTORY — DX: Nonrheumatic aortic (valve) stenosis: I35.0

## 2018-11-01 NOTE — Patient Instructions (Signed)
Medication Instructions:  Your physician recommends that you continue on your current medications as directed. Please refer to the Current Medication list given to you today.  *If you need a refill on your cardiac medications before your next appointment, please call your pharmacy*  Lab Work: Your physician recommends that you have a BMP drawn.  If you have labs (blood work) drawn today and your tests are completely normal, you will receive your results only by: Marland Kitchen MyChart Message (if you have MyChart) OR . A paper copy in the mail If you have any lab test that is abnormal or we need to change your treatment, we will call you to review the results.  Testing/Procedures: NONE  Follow-Up: At [facility], you and your health needs are our priority.  As part of our continuing mission to provide you with exceptional heart care, we have created designated Provider Care Teams.  These Care Teams include your primary Cardiologist (physician) and Advanced Practice Providers (APPs -  Physician Assistants and Nurse Practitioners) who all work together to provide you with the care you need, when you need it.  Your next appointment:   2 months  The format for your next appointment:   In Person  Provider:   Jyl Heinz, MD

## 2018-11-01 NOTE — Progress Notes (Signed)
Cardiology Office Note:    Date:  11/01/2018   ID:  Tamaira Ciriello, DOB 08/27/1950, MRN [id number]  PCP:  Benito Mccreedy, MD  Cardiologist:  Jenean Lindau, MD   Referring MD: Benito Mccreedy, MD    ASSESSMENT:    1. HOCM (hypertrophic obstructive cardiomyopathy) ([place])   2. Essential hypertension   3. Morbid obesity ([place])   4. Aortic valve stenosis, etiology of cardiac valve disease unspecified    PLAN:    In order of problems listed above:  1. Hypertrophic cardiomyopathy: I reviewed multiple echocardiograms done from the past and that it dating to be stable.  She has not had any syncopal episodes or any dizzy spells or any such issues. 2. Congestive heart failure: Patient is on appropriate therapy.  Her blood pressure stable.  Diet including salt intake issues were discussed with her at extensive length.  She is on diuretic therapy therefore she will have a Chem-7 today. 3. Morbid obesity: Diet was discussed with the patient and weight reduction was stressed.  She promises to do better. 4. Patient will be seen in follow-up appointment in 2 months or earlier if the patient has any concerns    Medication Adjustments/Labs and Tests Ordered: Current medicines are reviewed at length with the patient today.  Concerns regarding medicines are outlined above.  No orders of the defined types were placed in this encounter.  No orders of the defined types were placed in this encounter.    History of Present Illness:    Nikiya Starn is a 68 y.o. female who is being seen today for the evaluation of hypertrophic cardiomyopathy and aortic stenosis and congestive heart failure at the request of Osei-Bonsu, Iona Beard, MD.  Patient is a pleasant 68 year old female.  She has past medical history of hypertension, hypertrophic cardiomyopathy and aortic stenosis.  She is morbidly obese and is brought in here in a wheelchair.  She denies any chest pain orthopnea or PND.  She leads a sedentary  lifestyle.  She was treated for congestive heart failure and is on diuretic therapy and feels much better.  At the time of my evaluation, the patient is alert awake oriented and in no distress.  Past Medical History:  Diagnosis Date  . Arthritis   . Asthma   . CHF (congestive heart failure) ([place])   . COPD (chronic obstructive pulmonary disease) ([place])   . Diabetes mellitus without complication ([place])   . High cholesterol   . Hypertension   . Morbid obesity ([place])     Past Surgical History:  Procedure Laterality Date  . ABDOMINAL HYSTERECTOMY    . CESAREAN SECTION      Current Medications: Current Meds  Medication Sig  . allopurinol (ZYLOPRIM) 300 MG tablet Take 300 mg by mouth daily.  Marland Kitchen aspirin EC 81 MG EC tablet Take 1 tablet (81 mg total) by mouth daily.  . Calcium Carb-Cholecalciferol (CALCIUM-VITAMIN D3) 500-400 MG-UNIT TABS Take 1 tablet by mouth 2 (two) times daily.  . Cholecalciferol (VITAMIN D3) 2000 units capsule Take 1 capsule by mouth daily.  . Cyanocobalamin (VITAMIN B-12 PO) Take 1 tablet by mouth daily.  . furosemide (LASIX) 40 MG tablet Take 2 tablets (80 mg total) by mouth 2 (two) times daily.  . Garlic 2536 MG CAPS Take 1,000 mg by mouth daily.  Marland Kitchen glimepiride (AMARYL) 4 MG tablet Take 4 mg by mouth daily.   Marland Kitchen ipratropium-albuterol (DUONEB) 0.5-2.5 (3) MG/3ML SOLN Use 1 vial in nebulizer every 4 hours as needed  .  losartan (COZAAR) 100 MG tablet Take 1 tablet by mouth daily.  . metFORMIN (GLUCOPHAGE-XR) 500 MG 24 hr tablet Take 500 mg by mouth 2 (two) times daily.  . montelukast (SINGULAIR) 10 MG tablet Take 10 mg by mouth at bedtime.  . Multiple Vitamin (MULTIVITAMIN WITH MINERALS) TABS tablet Take 1 tablet by mouth daily.  . nitroGLYCERIN (NITROSTAT) 0.4 MG SL tablet Place 1 tablet (0.4 mg total) under the tongue every 5 (five) minutes as needed for chest pain.  . Omega-3 Fatty Acids (FISH OIL) 1000 MG CAPS Take 1,000 mg by mouth 2 (two) times daily.   .  polyethylene glycol (MIRALAX / GLYCOLAX) 17 g packet Take 17 g by mouth daily.  . rosuvastatin (CRESTOR) 40 MG tablet Take 40 mg by mouth daily.  . Turmeric (QC TUMERIC COMPLEX) 500 MG CAPS Take 1 capsule by mouth daily.     Allergies:   Atorvastatin, Ciprocinonide [fluocinolone], Catapres [clonidine hcl], and Demadex [torsemide]   Social History   Socioeconomic History  . Marital status: Married    Spouse name: Not on file  . Number of children: Not on file  . Years of education: Not on file  . Highest education level: Not on file  Occupational History  . Not on file  Social Needs  . Financial resource strain: Not on file  . Food insecurity    Worry: Not on file    Inability: Not on file  . Transportation needs    Medical: Not on file    Non-medical: Not on file  Tobacco Use  . Smoking status: Never Smoker  . Smokeless tobacco: Never Used  Substance and Sexual Activity  . Alcohol use: No  . Drug use: No  . Sexual activity: Never  Lifestyle  . Physical activity    Days per week: Not on file    Minutes per session: Not on file  . Stress: Not on file  Relationships  . Social Musician on phone: Not on file    Gets together: Not on file    Attends religious service: Not on file    Active member of club or organization: Not on file    Attends meetings of clubs or organizations: Not on file    Relationship status: Not on file  Other Topics Concern  . Not on file  Social History Narrative  . Not on file     Family History: The patient's family history includes Breast cancer in her sister; CAD in her mother; Cancer in her father and sister; Diabetes in her mother; Diabetes Mellitus I in her mother; Heart disease in her father.  ROS:   Please see the history of present illness.    All other systems reviewed and are negative.  EKGs/Labs/Other Studies Reviewed:    The following studies were reviewed today:  IMPRESSIONS    1. Left ventricular  ejection fraction, by visual estimation, is 60 to 65%. The left ventricle has normal function. Normal left ventricular size. There is severely increased left ventricular hypertrophy.  2. Chordal SAM with small LVOT gradient with severe LVH findings may be consistent with HOCM.  3. Global right ventricle has normal systolic function.The right ventricular size is normal. No increase in right ventricular wall thickness.  4. Left atrial size was moderately dilated.  5. Right atrial size was normal.  6. Moderate mitral annular calcification.  7. The mitral valve is normal in structure. Trace mitral valve regurgitation. No evidence of mitral stenosis.  8. The tricuspid valve is normal in structure. Tricuspid valve regurgitation is mild.  9. The aortic valve was not well visualized Aortic valve regurgitation was not visualized by color flow Doppler. Mild aortic valve stenosis. 10. The pulmonic valve was normal in structure. Pulmonic valve regurgitation is mild by color flow Doppler. 11. Moderately elevated pulmonary artery systolic pressure. 12. The inferior vena cava is normal in size with greater than 50% respiratory variability, suggesting right atrial pressure of 3 mmHg.    Recent Labs: 10/20/2018: B Natriuretic Peptide 671.3 10/24/2018: ALT 22; BUN 38; Creatinine, Ser 0.88; Hemoglobin 9.8; Magnesium 2.0; Platelets 125; Potassium 4.0; Sodium 140  Recent Lipid Panel    Component Value Date/Time   CHOL 181 08/24/2015 0422   TRIG 100 08/24/2015 0422   HDL 43 08/24/2015 0422   CHOLHDL 4.2 08/24/2015 0422   VLDL 20 08/24/2015 0422   LDLCALC 118 (H) 08/24/2015 0422    Physical Exam:    VS:  BP (!) 122/56 (BP Location: Left Arm, Patient Position: Sitting, Cuff Size: Normal)   Pulse 76   Ht 5\' 3"  (1.6 m)   Wt 291 lb (132 kg)   SpO2 100%   BMI 51.55 kg/m     Wt Readings from Last 3 Encounters:  11/01/18 291 lb (132 kg)  10/24/18 293 lb 3.4 oz (133 kg)  08/03/16 (!) 309 lb 3.2 oz (140.3  kg)     GEN: Patient is in no acute distress HEENT: Normal NECK: No JVD; No carotid bruits LYMPHATICS: No lymphadenopathy CARDIAC: S1 S2 regular, 2/6 systolic murmur at the apex. RESPIRATORY:  Clear to auscultation without rales, wheezing or rhonchi  ABDOMEN: Soft, non-tender, non-distended MUSCULOSKELETAL:  No edema; No deformity  SKIN: Warm and dry NEUROLOGIC:  Alert and oriented x 3 PSYCHIATRIC:  Normal affect    Signed, Garwin Brothersajan R Revankar, MD  11/01/2018 10:40 AM    [facility]

## 2018-11-02 LAB — BASIC METABOLIC PANEL
BUN/Creatinine Ratio: 32 — ABNORMAL HIGH (ref 12–28)
BUN: 36 mg/dL — ABNORMAL HIGH (ref 8–27)
CO2: 34 mmol/L — ABNORMAL HIGH (ref 20–29)
Calcium: 9.4 mg/dL (ref 8.7–10.3)
Chloride: 97 mmol/L (ref 96–106)
Creatinine, Ser: 1.11 mg/dL — ABNORMAL HIGH (ref 0.57–1.00)
GFR calc Af Amer: 59 mL/min/{1.73_m2} — ABNORMAL LOW (ref >59)
GFR calc non Af Amer: 51 mL/min/{1.73_m2} — ABNORMAL LOW (ref >59)
Glucose: 160 mg/dL — ABNORMAL HIGH (ref 65–99)
Potassium: 3.9 mmol/L (ref 3.5–5.2)
Sodium: 143 mmol/L (ref 134–144)

## 2018-11-04 DIAGNOSIS — I5033 Acute on chronic diastolic (congestive) heart failure: Secondary | ICD-10-CM | POA: Diagnosis not present

## 2018-11-04 DIAGNOSIS — I350 Nonrheumatic aortic (valve) stenosis: Secondary | ICD-10-CM | POA: Diagnosis not present

## 2018-11-04 DIAGNOSIS — I110 Hypertensive heart disease with heart failure: Secondary | ICD-10-CM | POA: Diagnosis not present

## 2018-11-04 DIAGNOSIS — J449 Chronic obstructive pulmonary disease, unspecified: Secondary | ICD-10-CM | POA: Diagnosis not present

## 2018-11-04 DIAGNOSIS — J9621 Acute and chronic respiratory failure with hypoxia: Secondary | ICD-10-CM | POA: Diagnosis not present

## 2018-11-04 DIAGNOSIS — J9622 Acute and chronic respiratory failure with hypercapnia: Secondary | ICD-10-CM | POA: Diagnosis not present

## 2018-11-04 DIAGNOSIS — E1165 Type 2 diabetes mellitus with hyperglycemia: Secondary | ICD-10-CM | POA: Diagnosis not present

## 2018-11-04 DIAGNOSIS — I2510 Atherosclerotic heart disease of native coronary artery without angina pectoris: Secondary | ICD-10-CM | POA: Diagnosis not present

## 2018-11-04 DIAGNOSIS — E785 Hyperlipidemia, unspecified: Secondary | ICD-10-CM | POA: Diagnosis not present

## 2018-11-07 ENCOUNTER — Telehealth: Payer: Self-pay | Admitting: *Deleted

## 2018-11-07 DIAGNOSIS — J9621 Acute and chronic respiratory failure with hypoxia: Secondary | ICD-10-CM | POA: Diagnosis not present

## 2018-11-07 DIAGNOSIS — I2510 Atherosclerotic heart disease of native coronary artery without angina pectoris: Secondary | ICD-10-CM | POA: Diagnosis not present

## 2018-11-07 DIAGNOSIS — G4730 Sleep apnea, unspecified: Secondary | ICD-10-CM | POA: Diagnosis not present

## 2018-11-07 DIAGNOSIS — R0602 Shortness of breath: Secondary | ICD-10-CM | POA: Diagnosis not present

## 2018-11-07 DIAGNOSIS — I110 Hypertensive heart disease with heart failure: Secondary | ICD-10-CM | POA: Diagnosis not present

## 2018-11-07 DIAGNOSIS — I350 Nonrheumatic aortic (valve) stenosis: Secondary | ICD-10-CM | POA: Diagnosis not present

## 2018-11-07 DIAGNOSIS — I2781 Cor pulmonale (chronic): Secondary | ICD-10-CM | POA: Diagnosis not present

## 2018-11-07 DIAGNOSIS — E1165 Type 2 diabetes mellitus with hyperglycemia: Secondary | ICD-10-CM | POA: Diagnosis not present

## 2018-11-07 DIAGNOSIS — J45909 Unspecified asthma, uncomplicated: Secondary | ICD-10-CM | POA: Diagnosis not present

## 2018-11-07 DIAGNOSIS — J9622 Acute and chronic respiratory failure with hypercapnia: Secondary | ICD-10-CM | POA: Diagnosis not present

## 2018-11-07 DIAGNOSIS — J449 Chronic obstructive pulmonary disease, unspecified: Secondary | ICD-10-CM | POA: Diagnosis not present

## 2018-11-07 DIAGNOSIS — I5033 Acute on chronic diastolic (congestive) heart failure: Secondary | ICD-10-CM | POA: Diagnosis not present

## 2018-11-07 DIAGNOSIS — E785 Hyperlipidemia, unspecified: Secondary | ICD-10-CM | POA: Diagnosis not present

## 2018-11-07 NOTE — Telephone Encounter (Signed)
7 day ZIO XT long term holter monitor mailed to patients home.  Instructions reviewed briefly as they are included in the monitor kit. 

## 2018-11-08 ENCOUNTER — Telehealth: Payer: Self-pay | Admitting: Cardiology

## 2018-11-08 DIAGNOSIS — J449 Chronic obstructive pulmonary disease, unspecified: Secondary | ICD-10-CM | POA: Diagnosis not present

## 2018-11-08 DIAGNOSIS — E785 Hyperlipidemia, unspecified: Secondary | ICD-10-CM | POA: Diagnosis not present

## 2018-11-08 DIAGNOSIS — J9621 Acute and chronic respiratory failure with hypoxia: Secondary | ICD-10-CM | POA: Diagnosis not present

## 2018-11-08 DIAGNOSIS — J9622 Acute and chronic respiratory failure with hypercapnia: Secondary | ICD-10-CM | POA: Diagnosis not present

## 2018-11-08 DIAGNOSIS — I350 Nonrheumatic aortic (valve) stenosis: Secondary | ICD-10-CM | POA: Diagnosis not present

## 2018-11-08 DIAGNOSIS — I2510 Atherosclerotic heart disease of native coronary artery without angina pectoris: Secondary | ICD-10-CM | POA: Diagnosis not present

## 2018-11-08 DIAGNOSIS — E1165 Type 2 diabetes mellitus with hyperglycemia: Secondary | ICD-10-CM | POA: Diagnosis not present

## 2018-11-08 DIAGNOSIS — I110 Hypertensive heart disease with heart failure: Secondary | ICD-10-CM | POA: Diagnosis not present

## 2018-11-08 DIAGNOSIS — I5033 Acute on chronic diastolic (congestive) heart failure: Secondary | ICD-10-CM | POA: Diagnosis not present

## 2018-11-08 NOTE — Telephone Encounter (Signed)
Attempted to call patient back, no answer or voicemail. Will continue efforts.

## 2018-11-08 NOTE — Telephone Encounter (Signed)
Patient returned your call regarding labs  Please call her back

## 2018-11-12 DIAGNOSIS — I350 Nonrheumatic aortic (valve) stenosis: Secondary | ICD-10-CM | POA: Diagnosis not present

## 2018-11-12 DIAGNOSIS — I110 Hypertensive heart disease with heart failure: Secondary | ICD-10-CM | POA: Diagnosis not present

## 2018-11-12 DIAGNOSIS — J449 Chronic obstructive pulmonary disease, unspecified: Secondary | ICD-10-CM | POA: Diagnosis not present

## 2018-11-12 DIAGNOSIS — E1165 Type 2 diabetes mellitus with hyperglycemia: Secondary | ICD-10-CM | POA: Diagnosis not present

## 2018-11-12 DIAGNOSIS — J9621 Acute and chronic respiratory failure with hypoxia: Secondary | ICD-10-CM | POA: Diagnosis not present

## 2018-11-12 DIAGNOSIS — I5033 Acute on chronic diastolic (congestive) heart failure: Secondary | ICD-10-CM | POA: Diagnosis not present

## 2018-11-12 DIAGNOSIS — J9622 Acute and chronic respiratory failure with hypercapnia: Secondary | ICD-10-CM | POA: Diagnosis not present

## 2018-11-12 DIAGNOSIS — I2510 Atherosclerotic heart disease of native coronary artery without angina pectoris: Secondary | ICD-10-CM | POA: Diagnosis not present

## 2018-11-12 DIAGNOSIS — E785 Hyperlipidemia, unspecified: Secondary | ICD-10-CM | POA: Diagnosis not present

## 2018-11-13 DIAGNOSIS — I5033 Acute on chronic diastolic (congestive) heart failure: Secondary | ICD-10-CM | POA: Diagnosis not present

## 2018-11-13 DIAGNOSIS — I110 Hypertensive heart disease with heart failure: Secondary | ICD-10-CM | POA: Diagnosis not present

## 2018-11-13 DIAGNOSIS — J9622 Acute and chronic respiratory failure with hypercapnia: Secondary | ICD-10-CM | POA: Diagnosis not present

## 2018-11-13 DIAGNOSIS — J9621 Acute and chronic respiratory failure with hypoxia: Secondary | ICD-10-CM | POA: Diagnosis not present

## 2018-11-13 DIAGNOSIS — I350 Nonrheumatic aortic (valve) stenosis: Secondary | ICD-10-CM | POA: Diagnosis not present

## 2018-11-13 DIAGNOSIS — I2510 Atherosclerotic heart disease of native coronary artery without angina pectoris: Secondary | ICD-10-CM | POA: Diagnosis not present

## 2018-11-13 DIAGNOSIS — E1165 Type 2 diabetes mellitus with hyperglycemia: Secondary | ICD-10-CM | POA: Diagnosis not present

## 2018-11-13 DIAGNOSIS — J449 Chronic obstructive pulmonary disease, unspecified: Secondary | ICD-10-CM | POA: Diagnosis not present

## 2018-11-13 DIAGNOSIS — E785 Hyperlipidemia, unspecified: Secondary | ICD-10-CM | POA: Diagnosis not present

## 2018-11-14 DIAGNOSIS — E785 Hyperlipidemia, unspecified: Secondary | ICD-10-CM | POA: Diagnosis not present

## 2018-11-14 DIAGNOSIS — I5033 Acute on chronic diastolic (congestive) heart failure: Secondary | ICD-10-CM | POA: Diagnosis not present

## 2018-11-14 DIAGNOSIS — I2510 Atherosclerotic heart disease of native coronary artery without angina pectoris: Secondary | ICD-10-CM | POA: Diagnosis not present

## 2018-11-14 DIAGNOSIS — J449 Chronic obstructive pulmonary disease, unspecified: Secondary | ICD-10-CM | POA: Diagnosis not present

## 2018-11-14 DIAGNOSIS — E1165 Type 2 diabetes mellitus with hyperglycemia: Secondary | ICD-10-CM | POA: Diagnosis not present

## 2018-11-14 DIAGNOSIS — J9621 Acute and chronic respiratory failure with hypoxia: Secondary | ICD-10-CM | POA: Diagnosis not present

## 2018-11-14 DIAGNOSIS — I110 Hypertensive heart disease with heart failure: Secondary | ICD-10-CM | POA: Diagnosis not present

## 2018-11-14 DIAGNOSIS — J9622 Acute and chronic respiratory failure with hypercapnia: Secondary | ICD-10-CM | POA: Diagnosis not present

## 2018-11-14 DIAGNOSIS — I350 Nonrheumatic aortic (valve) stenosis: Secondary | ICD-10-CM | POA: Diagnosis not present

## 2018-11-15 NOTE — Telephone Encounter (Signed)
Called patient went over lab results with her, no further questions.

## 2018-11-18 ENCOUNTER — Ambulatory Visit (INDEPENDENT_AMBULATORY_CARE_PROVIDER_SITE_OTHER): Payer: Medicare HMO

## 2018-11-18 DIAGNOSIS — I2510 Atherosclerotic heart disease of native coronary artery without angina pectoris: Secondary | ICD-10-CM | POA: Diagnosis not present

## 2018-11-18 DIAGNOSIS — J9621 Acute and chronic respiratory failure with hypoxia: Secondary | ICD-10-CM | POA: Diagnosis not present

## 2018-11-18 DIAGNOSIS — I110 Hypertensive heart disease with heart failure: Secondary | ICD-10-CM | POA: Diagnosis not present

## 2018-11-18 DIAGNOSIS — I421 Obstructive hypertrophic cardiomyopathy: Secondary | ICD-10-CM | POA: Diagnosis not present

## 2018-11-18 DIAGNOSIS — I442 Atrioventricular block, complete: Secondary | ICD-10-CM | POA: Diagnosis not present

## 2018-11-18 DIAGNOSIS — J9622 Acute and chronic respiratory failure with hypercapnia: Secondary | ICD-10-CM | POA: Diagnosis not present

## 2018-11-18 DIAGNOSIS — I5033 Acute on chronic diastolic (congestive) heart failure: Secondary | ICD-10-CM | POA: Diagnosis not present

## 2018-11-18 DIAGNOSIS — I350 Nonrheumatic aortic (valve) stenosis: Secondary | ICD-10-CM | POA: Diagnosis not present

## 2018-11-18 DIAGNOSIS — E1165 Type 2 diabetes mellitus with hyperglycemia: Secondary | ICD-10-CM | POA: Diagnosis not present

## 2018-11-18 DIAGNOSIS — J449 Chronic obstructive pulmonary disease, unspecified: Secondary | ICD-10-CM | POA: Diagnosis not present

## 2018-11-18 DIAGNOSIS — E785 Hyperlipidemia, unspecified: Secondary | ICD-10-CM | POA: Diagnosis not present

## 2018-11-20 DIAGNOSIS — H2513 Age-related nuclear cataract, bilateral: Secondary | ICD-10-CM | POA: Diagnosis not present

## 2018-11-20 DIAGNOSIS — H5200 Hypermetropia, unspecified eye: Secondary | ICD-10-CM | POA: Diagnosis not present

## 2018-11-20 DIAGNOSIS — H35033 Hypertensive retinopathy, bilateral: Secondary | ICD-10-CM | POA: Diagnosis not present

## 2018-11-20 DIAGNOSIS — E7800 Pure hypercholesterolemia, unspecified: Secondary | ICD-10-CM | POA: Diagnosis not present

## 2018-11-20 DIAGNOSIS — E119 Type 2 diabetes mellitus without complications: Secondary | ICD-10-CM | POA: Diagnosis not present

## 2018-11-22 DIAGNOSIS — E1165 Type 2 diabetes mellitus with hyperglycemia: Secondary | ICD-10-CM | POA: Diagnosis not present

## 2018-11-22 DIAGNOSIS — I110 Hypertensive heart disease with heart failure: Secondary | ICD-10-CM | POA: Diagnosis not present

## 2018-11-22 DIAGNOSIS — I2510 Atherosclerotic heart disease of native coronary artery without angina pectoris: Secondary | ICD-10-CM | POA: Diagnosis not present

## 2018-11-22 DIAGNOSIS — I5033 Acute on chronic diastolic (congestive) heart failure: Secondary | ICD-10-CM | POA: Diagnosis not present

## 2018-11-22 DIAGNOSIS — E785 Hyperlipidemia, unspecified: Secondary | ICD-10-CM | POA: Diagnosis not present

## 2018-11-22 DIAGNOSIS — I350 Nonrheumatic aortic (valve) stenosis: Secondary | ICD-10-CM | POA: Diagnosis not present

## 2018-11-22 DIAGNOSIS — J9621 Acute and chronic respiratory failure with hypoxia: Secondary | ICD-10-CM | POA: Diagnosis not present

## 2018-11-22 DIAGNOSIS — J449 Chronic obstructive pulmonary disease, unspecified: Secondary | ICD-10-CM | POA: Diagnosis not present

## 2018-11-22 DIAGNOSIS — J9622 Acute and chronic respiratory failure with hypercapnia: Secondary | ICD-10-CM | POA: Diagnosis not present

## 2018-11-25 DIAGNOSIS — I350 Nonrheumatic aortic (valve) stenosis: Secondary | ICD-10-CM | POA: Diagnosis not present

## 2018-11-25 DIAGNOSIS — J9621 Acute and chronic respiratory failure with hypoxia: Secondary | ICD-10-CM | POA: Diagnosis not present

## 2018-11-25 DIAGNOSIS — E1165 Type 2 diabetes mellitus with hyperglycemia: Secondary | ICD-10-CM | POA: Diagnosis not present

## 2018-11-25 DIAGNOSIS — J449 Chronic obstructive pulmonary disease, unspecified: Secondary | ICD-10-CM | POA: Diagnosis not present

## 2018-11-25 DIAGNOSIS — I2510 Atherosclerotic heart disease of native coronary artery without angina pectoris: Secondary | ICD-10-CM | POA: Diagnosis not present

## 2018-11-25 DIAGNOSIS — I110 Hypertensive heart disease with heart failure: Secondary | ICD-10-CM | POA: Diagnosis not present

## 2018-11-25 DIAGNOSIS — J9622 Acute and chronic respiratory failure with hypercapnia: Secondary | ICD-10-CM | POA: Diagnosis not present

## 2018-11-25 DIAGNOSIS — I5033 Acute on chronic diastolic (congestive) heart failure: Secondary | ICD-10-CM | POA: Diagnosis not present

## 2018-11-25 DIAGNOSIS — E785 Hyperlipidemia, unspecified: Secondary | ICD-10-CM | POA: Diagnosis not present

## 2018-11-28 DIAGNOSIS — J449 Chronic obstructive pulmonary disease, unspecified: Secondary | ICD-10-CM | POA: Diagnosis not present

## 2018-12-03 ENCOUNTER — Other Ambulatory Visit: Payer: Self-pay | Admitting: Cardiology

## 2018-12-03 ENCOUNTER — Telehealth: Payer: Self-pay | Admitting: Cardiology

## 2018-12-03 DIAGNOSIS — I2510 Atherosclerotic heart disease of native coronary artery without angina pectoris: Secondary | ICD-10-CM | POA: Diagnosis not present

## 2018-12-03 DIAGNOSIS — J9621 Acute and chronic respiratory failure with hypoxia: Secondary | ICD-10-CM | POA: Diagnosis not present

## 2018-12-03 DIAGNOSIS — J449 Chronic obstructive pulmonary disease, unspecified: Secondary | ICD-10-CM | POA: Diagnosis not present

## 2018-12-03 DIAGNOSIS — I5033 Acute on chronic diastolic (congestive) heart failure: Secondary | ICD-10-CM | POA: Diagnosis not present

## 2018-12-03 DIAGNOSIS — I350 Nonrheumatic aortic (valve) stenosis: Secondary | ICD-10-CM | POA: Diagnosis not present

## 2018-12-03 DIAGNOSIS — J9622 Acute and chronic respiratory failure with hypercapnia: Secondary | ICD-10-CM | POA: Diagnosis not present

## 2018-12-03 DIAGNOSIS — I110 Hypertensive heart disease with heart failure: Secondary | ICD-10-CM | POA: Diagnosis not present

## 2018-12-03 DIAGNOSIS — E785 Hyperlipidemia, unspecified: Secondary | ICD-10-CM | POA: Diagnosis not present

## 2018-12-03 DIAGNOSIS — E1165 Type 2 diabetes mellitus with hyperglycemia: Secondary | ICD-10-CM | POA: Diagnosis not present

## 2018-12-03 MED ORDER — FUROSEMIDE 40 MG PO TABS: 80.0000 mg | ORAL_TABLET | Freq: Two times a day (BID) | ORAL | 1 refills | Status: DC

## 2018-12-03 NOTE — Telephone Encounter (Signed)
Spoke with Santiago Glad and patient needs furosemide sent to pharmacy as she is completely out. Rx sent to pharmacy.

## 2018-12-03 NOTE — Telephone Encounter (Signed)
[facility] needs [facility] call right away regarding this patient. Her number is [phone]

## 2018-12-05 DIAGNOSIS — R002 Palpitations: Secondary | ICD-10-CM | POA: Diagnosis not present

## 2018-12-06 DIAGNOSIS — R928 Other abnormal and inconclusive findings on diagnostic imaging of breast: Secondary | ICD-10-CM | POA: Diagnosis not present

## 2018-12-06 DIAGNOSIS — N6459 Other signs and symptoms in breast: Secondary | ICD-10-CM | POA: Diagnosis not present

## 2018-12-06 DIAGNOSIS — N6489 Other specified disorders of breast: Secondary | ICD-10-CM | POA: Diagnosis not present

## 2018-12-10 DIAGNOSIS — J9622 Acute and chronic respiratory failure with hypercapnia: Secondary | ICD-10-CM | POA: Diagnosis not present

## 2018-12-10 DIAGNOSIS — J9621 Acute and chronic respiratory failure with hypoxia: Secondary | ICD-10-CM | POA: Diagnosis not present

## 2018-12-10 DIAGNOSIS — I350 Nonrheumatic aortic (valve) stenosis: Secondary | ICD-10-CM | POA: Diagnosis not present

## 2018-12-10 DIAGNOSIS — I2510 Atherosclerotic heart disease of native coronary artery without angina pectoris: Secondary | ICD-10-CM | POA: Diagnosis not present

## 2018-12-10 DIAGNOSIS — E1165 Type 2 diabetes mellitus with hyperglycemia: Secondary | ICD-10-CM | POA: Diagnosis not present

## 2018-12-10 DIAGNOSIS — J449 Chronic obstructive pulmonary disease, unspecified: Secondary | ICD-10-CM | POA: Diagnosis not present

## 2018-12-10 DIAGNOSIS — I5033 Acute on chronic diastolic (congestive) heart failure: Secondary | ICD-10-CM | POA: Diagnosis not present

## 2018-12-10 DIAGNOSIS — I110 Hypertensive heart disease with heart failure: Secondary | ICD-10-CM | POA: Diagnosis not present

## 2018-12-10 DIAGNOSIS — E785 Hyperlipidemia, unspecified: Secondary | ICD-10-CM | POA: Diagnosis not present

## 2018-12-11 ENCOUNTER — Telehealth: Payer: Self-pay

## 2018-12-11 DIAGNOSIS — I10 Essential (primary) hypertension: Secondary | ICD-10-CM | POA: Diagnosis not present

## 2018-12-11 DIAGNOSIS — Z789 Other specified health status: Secondary | ICD-10-CM | POA: Diagnosis not present

## 2018-12-11 DIAGNOSIS — I872 Venous insufficiency (chronic) (peripheral): Secondary | ICD-10-CM | POA: Diagnosis not present

## 2018-12-11 DIAGNOSIS — E782 Mixed hyperlipidemia: Secondary | ICD-10-CM | POA: Diagnosis not present

## 2018-12-11 DIAGNOSIS — J45909 Unspecified asthma, uncomplicated: Secondary | ICD-10-CM | POA: Diagnosis not present

## 2018-12-11 DIAGNOSIS — E611 Iron deficiency: Secondary | ICD-10-CM | POA: Diagnosis not present

## 2018-12-11 DIAGNOSIS — E1165 Type 2 diabetes mellitus with hyperglycemia: Secondary | ICD-10-CM | POA: Diagnosis not present

## 2018-12-11 DIAGNOSIS — I119 Hypertensive heart disease without heart failure: Secondary | ICD-10-CM | POA: Diagnosis not present

## 2018-12-11 NOTE — Telephone Encounter (Signed)
Left message that monitor results were ok, copy sent to Dr. Emilee Hero.

## 2018-12-11 NOTE — Telephone Encounter (Signed)
-----   Message from Jenean Lindau, MD sent at 12/10/2018 11:50 AM EST ----- The results of the study is unremarkable. Please inform patient. I will discuss in detail at next appointment. Cc  primary care/referring physician Jenean Lindau, MD 12/10/2018 11:49 AM

## 2018-12-11 NOTE — Telephone Encounter (Signed)
New Message  Patient states that she just received a notification about a monitor. Patient wants to know is she supposed to be receiving another monitor. Please give patient a call back to discuss.

## 2018-12-11 NOTE — Telephone Encounter (Signed)
Message regarding appointment was because patient was placed on schedule for the day she started her monitor in order to import her test results.  Appointment date was set at 11/18/18.

## 2018-12-12 DIAGNOSIS — I2510 Atherosclerotic heart disease of native coronary artery without angina pectoris: Secondary | ICD-10-CM | POA: Diagnosis not present

## 2018-12-12 DIAGNOSIS — I5033 Acute on chronic diastolic (congestive) heart failure: Secondary | ICD-10-CM | POA: Diagnosis not present

## 2018-12-12 DIAGNOSIS — E1165 Type 2 diabetes mellitus with hyperglycemia: Secondary | ICD-10-CM | POA: Diagnosis not present

## 2018-12-12 DIAGNOSIS — I350 Nonrheumatic aortic (valve) stenosis: Secondary | ICD-10-CM | POA: Diagnosis not present

## 2018-12-12 DIAGNOSIS — I110 Hypertensive heart disease with heart failure: Secondary | ICD-10-CM | POA: Diagnosis not present

## 2018-12-12 DIAGNOSIS — J9621 Acute and chronic respiratory failure with hypoxia: Secondary | ICD-10-CM | POA: Diagnosis not present

## 2018-12-12 DIAGNOSIS — J9622 Acute and chronic respiratory failure with hypercapnia: Secondary | ICD-10-CM | POA: Diagnosis not present

## 2018-12-12 DIAGNOSIS — E785 Hyperlipidemia, unspecified: Secondary | ICD-10-CM | POA: Diagnosis not present

## 2018-12-12 DIAGNOSIS — J449 Chronic obstructive pulmonary disease, unspecified: Secondary | ICD-10-CM | POA: Diagnosis not present

## 2018-12-13 DIAGNOSIS — B351 Tinea unguium: Secondary | ICD-10-CM | POA: Diagnosis not present

## 2018-12-13 DIAGNOSIS — E1159 Type 2 diabetes mellitus with other circulatory complications: Secondary | ICD-10-CM | POA: Diagnosis not present

## 2018-12-13 DIAGNOSIS — M79672 Pain in left foot: Secondary | ICD-10-CM | POA: Diagnosis not present

## 2018-12-13 DIAGNOSIS — L84 Corns and callosities: Secondary | ICD-10-CM | POA: Diagnosis not present

## 2018-12-13 DIAGNOSIS — M79671 Pain in right foot: Secondary | ICD-10-CM | POA: Diagnosis not present

## 2018-12-17 DIAGNOSIS — E1165 Type 2 diabetes mellitus with hyperglycemia: Secondary | ICD-10-CM | POA: Diagnosis not present

## 2018-12-17 DIAGNOSIS — I110 Hypertensive heart disease with heart failure: Secondary | ICD-10-CM | POA: Diagnosis not present

## 2018-12-17 DIAGNOSIS — I5033 Acute on chronic diastolic (congestive) heart failure: Secondary | ICD-10-CM | POA: Diagnosis not present

## 2018-12-17 DIAGNOSIS — J9622 Acute and chronic respiratory failure with hypercapnia: Secondary | ICD-10-CM | POA: Diagnosis not present

## 2018-12-17 DIAGNOSIS — I2510 Atherosclerotic heart disease of native coronary artery without angina pectoris: Secondary | ICD-10-CM | POA: Diagnosis not present

## 2018-12-17 DIAGNOSIS — J449 Chronic obstructive pulmonary disease, unspecified: Secondary | ICD-10-CM | POA: Diagnosis not present

## 2018-12-17 DIAGNOSIS — I350 Nonrheumatic aortic (valve) stenosis: Secondary | ICD-10-CM | POA: Diagnosis not present

## 2018-12-17 DIAGNOSIS — J9621 Acute and chronic respiratory failure with hypoxia: Secondary | ICD-10-CM | POA: Diagnosis not present

## 2018-12-17 DIAGNOSIS — E785 Hyperlipidemia, unspecified: Secondary | ICD-10-CM | POA: Diagnosis not present

## 2018-12-23 DIAGNOSIS — I5033 Acute on chronic diastolic (congestive) heart failure: Secondary | ICD-10-CM | POA: Diagnosis not present

## 2018-12-23 DIAGNOSIS — I350 Nonrheumatic aortic (valve) stenosis: Secondary | ICD-10-CM | POA: Diagnosis not present

## 2018-12-23 DIAGNOSIS — I110 Hypertensive heart disease with heart failure: Secondary | ICD-10-CM | POA: Diagnosis not present

## 2018-12-23 DIAGNOSIS — J9621 Acute and chronic respiratory failure with hypoxia: Secondary | ICD-10-CM | POA: Diagnosis not present

## 2018-12-23 DIAGNOSIS — J449 Chronic obstructive pulmonary disease, unspecified: Secondary | ICD-10-CM | POA: Diagnosis not present

## 2018-12-23 DIAGNOSIS — E785 Hyperlipidemia, unspecified: Secondary | ICD-10-CM | POA: Diagnosis not present

## 2018-12-23 DIAGNOSIS — J9622 Acute and chronic respiratory failure with hypercapnia: Secondary | ICD-10-CM | POA: Diagnosis not present

## 2018-12-23 DIAGNOSIS — I2510 Atherosclerotic heart disease of native coronary artery without angina pectoris: Secondary | ICD-10-CM | POA: Diagnosis not present

## 2018-12-23 DIAGNOSIS — E1165 Type 2 diabetes mellitus with hyperglycemia: Secondary | ICD-10-CM | POA: Diagnosis not present

## 2018-12-24 ENCOUNTER — Ambulatory Visit (INDEPENDENT_AMBULATORY_CARE_PROVIDER_SITE_OTHER): Payer: Medicare HMO | Admitting: Cardiology

## 2018-12-24 ENCOUNTER — Other Ambulatory Visit: Payer: Self-pay

## 2018-12-24 ENCOUNTER — Encounter: Payer: Self-pay | Admitting: Cardiology

## 2018-12-24 VITALS — BP 142/58 | HR 88 | Ht 63.0 in | Wt 289.4 lb

## 2018-12-24 DIAGNOSIS — I5032 Chronic diastolic (congestive) heart failure: Secondary | ICD-10-CM

## 2018-12-24 DIAGNOSIS — I350 Nonrheumatic aortic (valve) stenosis: Secondary | ICD-10-CM

## 2018-12-24 DIAGNOSIS — I421 Obstructive hypertrophic cardiomyopathy: Secondary | ICD-10-CM

## 2018-12-24 DIAGNOSIS — I10 Essential (primary) hypertension: Secondary | ICD-10-CM | POA: Diagnosis not present

## 2018-12-24 DIAGNOSIS — E119 Type 2 diabetes mellitus without complications: Secondary | ICD-10-CM

## 2018-12-24 NOTE — Patient Instructions (Signed)

## 2018-12-24 NOTE — Progress Notes (Signed)
Cardiology Office Note:    Date:  12/24/2018   ID:  Morgan Fischer, DOB 1950/02/16, MRN [id number]  PCP:  Jackie Plum, MD  Cardiologist:  Garwin Brothers, MD   Referring MD: Jackie Plum, MD    ASSESSMENT:    1. HOCM (hypertrophic obstructive cardiomyopathy) (HCC)   2. Essential hypertension   3. Chronic diastolic heart failure (HCC)   4. Diabetes mellitus without complication (HCC)   5. Aortic valve stenosis, etiology of cardiac valve disease unspecified    PLAN:    In order of problems listed above:  1. Hypertrophic cardiomyopathy: Report of the echocardiogram details with the patient at extensive length.  She is asymptomatic at this time.  She has multiple comorbidities.  She is morbidly obese and brought in in a wheelchair and uses oxygen all the time.  I discussed my findings with her at extensive length.  Again she is asymptomatic so we will not make any changes in her medications. 2. Essential hypertension: Blood pressure stable and diet was discussed and salt intake issues were discussed 3. Diabetes mellitus dyslipidemia morbid obesity: Visit significant comorbidities affecting her prognosis.  I discussed this with her at extensive length assist to do better.  She mentions to me that lab work done by her primary care physician on a regular basis.  Her hemoglobin A1c was significantly elevated and she is aware of it. 4. Patient will be seen in follow-up appointment in 6 months or earlier if the patient has any concerns    Medication Adjustments/Labs and Tests Ordered: Current medicines are reviewed at length with the patient today.  Concerns regarding medicines are outlined above.  No orders of the defined types were placed in this encounter.  No orders of the defined types were placed in this encounter.    No chief complaint on file.    History of Present Illness:    Morgan Fischer is a 68 y.o. female.  Patient has past medical history of hypertrophic  cardiomyopathy, essential hypertension, dyslipidemia, diabetes mellitus and morbid obesity.  She denies any problems at this time and takes care of activities of daily living.  She leads a very sedentary lifestyle and is brought in here in a wheelchair.  She uses oxygen all the time.  At the time of my evaluation, the patient is alert awake oriented and in no distress.  Past Medical History:  Diagnosis Date  . Arthritis   . Asthma   . CHF (congestive heart failure) (HCC)   . COPD (chronic obstructive pulmonary disease) (HCC)   . Diabetes mellitus without complication (HCC)   . High cholesterol   . Hypertension   . Morbid obesity (HCC)     Past Surgical History:  Procedure Laterality Date  . ABDOMINAL HYSTERECTOMY    . CESAREAN SECTION      Current Medications: Current Meds  Medication Sig  . allopurinol (ZYLOPRIM) 300 MG tablet Take 300 mg by mouth daily.  Marland Kitchen amLODipine (NORVASC) 10 MG tablet Take 5 mg by mouth daily.   Marland Kitchen aspirin EC 81 MG EC tablet Take 1 tablet (81 mg total) by mouth daily.  Marland Kitchen BREO ELLIPTA 100-25 MCG/INH AEPB   . Calcium Carb-Cholecalciferol (CALCIUM-VITAMIN D3) 500-400 MG-UNIT TABS Take 1 tablet by mouth 2 (two) times daily.  . Cholecalciferol (VITAMIN D3) 2000 units capsule Take 1 capsule by mouth daily.  . Cyanocobalamin (VITAMIN B-12 PO) Take 1 tablet by mouth daily.  . Fluticasone-Salmeterol (ADVAIR) 250-50 MCG/DOSE AEPB Inhale 1 puff into the  lungs 2 (two) times daily.  . furosemide (LASIX) 40 MG tablet TAKE 2 TABLETS(80 MG) BY MOUTH TWICE DAILY  . Garlic 3762 MG CAPS Take 1,000 mg by mouth daily.  Marland Kitchen glimepiride (AMARYL) 4 MG tablet Take 4 mg by mouth 2 (two) times daily.  Marland Kitchen ipratropium-albuterol (DUONEB) 0.5-2.5 (3) MG/3ML SOLN Use 1 vial in nebulizer every 4 hours as needed  . losartan (COZAAR) 100 MG tablet Take 1 tablet by mouth daily.  . metFORMIN (GLUCOPHAGE-XR) 500 MG 24 hr tablet Take 500 mg by mouth 3 (three) times daily.  . montelukast (SINGULAIR)  10 MG tablet Take 10 mg by mouth at bedtime.  . Multiple Vitamin (MULTIVITAMIN WITH MINERALS) TABS tablet Take 1 tablet by mouth daily.  . nitroGLYCERIN (NITROSTAT) 0.4 MG SL tablet Place 1 tablet (0.4 mg total) under the tongue every 5 (five) minutes as needed for chest pain.  . Omega-3 Fatty Acids (FISH OIL) 1000 MG CAPS Take 1,000 mg by mouth 2 (two) times daily.   . polyethylene glycol (MIRALAX / GLYCOLAX) 17 g packet Take 17 g by mouth daily.  . potassium chloride (KLOR-CON) 10 MEQ tablet Take 20 mEq by mouth 2 (two) times daily. TAKE 2 TABLETS BY MOUTH TWICE DAILY.  . rosuvastatin (CRESTOR) 40 MG tablet Take 40 mg by mouth daily.  . Turmeric (QC TUMERIC COMPLEX) 500 MG CAPS Take 1 capsule by mouth daily.  . VENTOLIN HFA 108 (90 Base) MCG/ACT inhaler INHALE 2 PUFFS INTO THE LUNGS QID PRN     Allergies:   Atorvastatin, Ciprocinonide [fluocinolone], Catapres [clonidine hcl], and Demadex [torsemide]   Social History   Socioeconomic History  . Marital status: Widowed    Spouse name: Not on file  . Number of children: Not on file  . Years of education: Not on file  . Highest education level: Not on file  Occupational History  . Not on file  Tobacco Use  . Smoking status: Never Smoker  . Smokeless tobacco: Never Used  Substance and Sexual Activity  . Alcohol use: No  . Drug use: No  . Sexual activity: Never  Other Topics Concern  . Not on file  Social History Narrative  . Not on file   Social Determinants of Health   Financial Resource Strain:   . Difficulty of Paying Living Expenses: Not on file  Food Insecurity:   . Worried About Charity fundraiser in the Last Year: Not on file  . Ran Out of Food in the Last Year: Not on file  Transportation Needs:   . Lack of Transportation (Medical): Not on file  . Lack of Transportation (Non-Medical): Not on file  Physical Activity:   . Days of Exercise per Week: Not on file  . Minutes of Exercise per Session: Not on file  Stress:    . Feeling of Stress : Not on file  Social Connections:   . Frequency of Communication with Friends and Family: Not on file  . Frequency of Social Gatherings with Friends and Family: Not on file  . Attends Religious Services: Not on file  . Active Member of Clubs or Organizations: Not on file  . Attends Archivist Meetings: Not on file  . Marital Status: Not on file     Family History: The patient's family history includes Breast cancer in her sister; CAD in her mother; Cancer in her father and sister; Diabetes in her mother; Diabetes Mellitus I in her mother; Heart disease in her father.  ROS:   Please see the history of present illness.    All other systems reviewed and are negative.  EKGs/Labs/Other Studies Reviewed:    The following studies were reviewed today: IMPRESSIONS    1. Left ventricular ejection fraction, by visual estimation, is 60 to 65%. The left ventricle has normal function. Normal left ventricular size. There is severely increased left ventricular hypertrophy.  2. Chordal SAM with small LVOT gradient with severe LVH findings may be consistent with HOCM.  3. Global right ventricle has normal systolic function.The right ventricular size is normal. No increase in right ventricular wall thickness.  4. Left atrial size was moderately dilated.  5. Right atrial size was normal.  6. Moderate mitral annular calcification.  7. The mitral valve is normal in structure. Trace mitral valve regurgitation. No evidence of mitral stenosis.  8. The tricuspid valve is normal in structure. Tricuspid valve regurgitation is mild.  9. The aortic valve was not well visualized Aortic valve regurgitation was not visualized by color flow Doppler. Mild aortic valve stenosis. 10. The pulmonic valve was normal in structure. Pulmonic valve regurgitation is mild by color flow Doppler. 11. Moderately elevated pulmonary artery systolic pressure. 12. The inferior vena cava is normal in  size with greater than 50% respiratory variability, suggesting right atrial pressure of 3 mmHg.  Study Highlights  Morgan, Fischer 27-Jun-1950, MRN [id number]  EVENT MONITOR REPORT:   Patient was monitored from 11/18/2018 to 11/25/2018. Indication:                    Palpitations Ordering physician:  Garwin Brothers, MD  Referring physician:        Garwin Brothers, MD    Baseline rhythm: Sinus  Minimum heart rate: 57 BPM.  Average heart rate: 71 BPM.  Maximal heart rate 119 BPM.  Atrial arrhythmia: Few brief atrial runs the longest was 12.1 seconds at a maximum rate of 179 and average rate of 128  Ventricular arrhythmia: Rare PVCs and ventricular bigeminy and trigeminy  Conduction abnormality: None significant  Symptoms: None significant   Conclusion:  Mildly abnormal and largely unremarkable event monitoring.  Rare brief fast rhythms which were asymptomatic, longest lasted 12 seconds.  Interpreting  cardiologist: Garwin Brothers, MD  Date: 12/10/2018 11:34 AM        Recent Labs: 10/20/2018: B Natriuretic Peptide 671.3 10/24/2018: ALT 22; Hemoglobin 9.8; Magnesium 2.0; Platelets 125 11/01/2018: BUN 36; Creatinine, Ser 1.11; Potassium 3.9; Sodium 143  Recent Lipid Panel    Component Value Date/Time   CHOL 181 08/24/2015 0422   TRIG 100 08/24/2015 0422   HDL 43 08/24/2015 0422   CHOLHDL 4.2 08/24/2015 0422   VLDL 20 08/24/2015 0422   LDLCALC 118 (H) 08/24/2015 0422    Physical Exam:    VS:  BP (!) 142/58   Pulse 88   Ht  (1.6 m)   Wt 289 lb 6.4 oz (131.3 kg)   SpO2 99%   BMI 51.26 kg/m     Wt Readings from Last 3 Encounters:  12/24/18 289 lb 6.4 oz (131.3 kg)  11/01/18 291 lb (132 kg)  10/24/18 293 lb 3.4 oz (133 kg)     GEN: Patient is in no acute distress HEENT: Normal NECK: No JVD; No carotid bruits LYMPHATICS: No lymphadenopathy CARDIAC: Hear sounds regular, 2/6 systolic murmur at the apex. RESPIRATORY:  Clear to  auscultation without rales, wheezing or rhonchi  ABDOMEN: Soft, non-tender, non-distended MUSCULOSKELETAL:  No edema;  No deformity  SKIN: Warm and dry NEUROLOGIC:  Alert and oriented x 3 PSYCHIATRIC:  Normal affect   Signed, Garwin Brothersajan R Selin Eisler, MD  12/24/2018 10:35 AM    [facility]

## 2018-12-28 DIAGNOSIS — J449 Chronic obstructive pulmonary disease, unspecified: Secondary | ICD-10-CM | POA: Diagnosis not present

## 2018-12-31 DIAGNOSIS — E782 Mixed hyperlipidemia: Secondary | ICD-10-CM | POA: Diagnosis not present

## 2018-12-31 DIAGNOSIS — I10 Essential (primary) hypertension: Secondary | ICD-10-CM | POA: Diagnosis not present

## 2018-12-31 DIAGNOSIS — E1165 Type 2 diabetes mellitus with hyperglycemia: Secondary | ICD-10-CM | POA: Diagnosis not present

## 2018-12-31 DIAGNOSIS — I872 Venous insufficiency (chronic) (peripheral): Secondary | ICD-10-CM | POA: Diagnosis not present

## 2018-12-31 DIAGNOSIS — J45909 Unspecified asthma, uncomplicated: Secondary | ICD-10-CM | POA: Diagnosis not present

## 2018-12-31 DIAGNOSIS — E611 Iron deficiency: Secondary | ICD-10-CM | POA: Diagnosis not present

## 2018-12-31 DIAGNOSIS — I119 Hypertensive heart disease without heart failure: Secondary | ICD-10-CM | POA: Diagnosis not present

## 2019-01-09 DIAGNOSIS — J45909 Unspecified asthma, uncomplicated: Secondary | ICD-10-CM | POA: Diagnosis not present

## 2019-01-09 DIAGNOSIS — G4730 Sleep apnea, unspecified: Secondary | ICD-10-CM | POA: Diagnosis not present

## 2019-01-09 DIAGNOSIS — J449 Chronic obstructive pulmonary disease, unspecified: Secondary | ICD-10-CM | POA: Diagnosis not present

## 2019-01-09 DIAGNOSIS — I2781 Cor pulmonale (chronic): Secondary | ICD-10-CM | POA: Diagnosis not present

## 2019-01-28 DIAGNOSIS — J449 Chronic obstructive pulmonary disease, unspecified: Secondary | ICD-10-CM | POA: Diagnosis not present

## 2019-01-31 ENCOUNTER — Telehealth: Payer: Self-pay | Admitting: Cardiology

## 2019-01-31 DIAGNOSIS — J45909 Unspecified asthma, uncomplicated: Secondary | ICD-10-CM | POA: Diagnosis not present

## 2019-01-31 DIAGNOSIS — R0602 Shortness of breath: Secondary | ICD-10-CM | POA: Diagnosis not present

## 2019-01-31 DIAGNOSIS — J9610 Chronic respiratory failure, unspecified whether with hypoxia or hypercapnia: Secondary | ICD-10-CM | POA: Diagnosis not present

## 2019-01-31 DIAGNOSIS — I2781 Cor pulmonale (chronic): Secondary | ICD-10-CM | POA: Diagnosis not present

## 2019-01-31 NOTE — Telephone Encounter (Signed)
Give appt

## 2019-01-31 NOTE — Telephone Encounter (Signed)
Patient is calling to make Dr. Tomie China aware that her oxygen levels have been fluctuating drastically. Patient states that when walking oxygen level is always below 85 and patient would like to advise with Dr. Tomie China. Please call.

## 2019-01-31 NOTE — Telephone Encounter (Signed)
Returned call to patient who states she has noticed a decrease in her O2 sat over the past few weeks. States when she walks 50 ft or less, her sat will be 79-85% on 2L O2. States previously she was able to walk that distance on 2 L O2 without difficulty. Seen by her pulmonologist today and they increased her O2 to 3 L at all times. He advised her to follow up with cardiology prior to returning to their office on Feb. 16 Patient denies weight gain, states abdomen feels tight at some times but denies increase in swelling in abdomen or extremities. Has noticed some difficulty with lying flat recently. Current takes furosemide 80 mg twice daily and notes good urine output. She states she is nervous about why her O2 level is dropping and wants to see Dr. Tomie China. I advised that her last echo showed normal EF for reassurance. I advised that I will route message to Dr. Tomie China for advice and she thanked me for the call.

## 2019-02-03 DIAGNOSIS — Z789 Other specified health status: Secondary | ICD-10-CM | POA: Diagnosis not present

## 2019-02-03 DIAGNOSIS — E611 Iron deficiency: Secondary | ICD-10-CM | POA: Diagnosis not present

## 2019-02-03 DIAGNOSIS — I872 Venous insufficiency (chronic) (peripheral): Secondary | ICD-10-CM | POA: Diagnosis not present

## 2019-02-03 DIAGNOSIS — Z0001 Encounter for general adult medical examination with abnormal findings: Secondary | ICD-10-CM | POA: Diagnosis not present

## 2019-02-03 DIAGNOSIS — I10 Essential (primary) hypertension: Secondary | ICD-10-CM | POA: Diagnosis not present

## 2019-02-03 DIAGNOSIS — I119 Hypertensive heart disease without heart failure: Secondary | ICD-10-CM | POA: Diagnosis not present

## 2019-02-03 DIAGNOSIS — E1165 Type 2 diabetes mellitus with hyperglycemia: Secondary | ICD-10-CM | POA: Diagnosis not present

## 2019-02-03 DIAGNOSIS — E782 Mixed hyperlipidemia: Secondary | ICD-10-CM | POA: Diagnosis not present

## 2019-02-03 DIAGNOSIS — J45909 Unspecified asthma, uncomplicated: Secondary | ICD-10-CM | POA: Diagnosis not present

## 2019-02-06 NOTE — Telephone Encounter (Signed)
appt moved from 02/18/19 to early f/u 02/13/19 per providers request.

## 2019-02-11 DIAGNOSIS — J449 Chronic obstructive pulmonary disease, unspecified: Secondary | ICD-10-CM | POA: Diagnosis not present

## 2019-02-11 DIAGNOSIS — J45909 Unspecified asthma, uncomplicated: Secondary | ICD-10-CM | POA: Diagnosis not present

## 2019-02-13 ENCOUNTER — Other Ambulatory Visit: Payer: Self-pay

## 2019-02-13 ENCOUNTER — Ambulatory Visit (INDEPENDENT_AMBULATORY_CARE_PROVIDER_SITE_OTHER): Payer: Medicare HMO | Admitting: Cardiology

## 2019-02-13 ENCOUNTER — Encounter: Payer: Self-pay | Admitting: Cardiology

## 2019-02-13 VITALS — BP 128/68 | HR 73 | Temp 97.4°F | Ht 63.0 in | Wt 280.8 lb

## 2019-02-13 DIAGNOSIS — I10 Essential (primary) hypertension: Secondary | ICD-10-CM

## 2019-02-13 DIAGNOSIS — I421 Obstructive hypertrophic cardiomyopathy: Secondary | ICD-10-CM

## 2019-02-13 NOTE — Progress Notes (Signed)
Cardiology Office Note:    Date:  02/13/2019   ID:  Morgan Fischer, DOB [birth year], MRN [id number]  PCP:  Jackie Plum, MD  Cardiologist:  Garwin Brothers, MD   Referring MD: Jackie Plum, MD    ASSESSMENT:    1. HOCM (hypertrophic obstructive cardiomyopathy) (HCC)   2. Essential hypertension    PLAN:    In order of problems listed above:  1. Hypertrophic cardiomyopathy: Stable at this time.  Patient has no symptoms of dizziness or syncope.  Her monitoring was largely unremarkable and I discussed this reports with her at extensive length her treatment I think is optimized as far as her vital signs are concerned.  Again she has multiple comorbidities which causes problems with ambulation and these include morbid obesity amongst other problems. 2. Essential hypertension: Blood pressure stable.  Patient is on diuretic therapy and chemistries including creatinine are followed by primary care physician. 3. Oxygen dependent: Managed by pulmonary.  She saw them recently. 4. 3 months follow-up or earlier if she has any concerns.  Patient had multiple questions which were answered to her satisfaction.   Medication Adjustments/Labs and Tests Ordered: Current medicines are reviewed at length with the patient today.  Concerns regarding medicines are outlined above.  No orders of the defined types were placed in this encounter.  No orders of the defined types were placed in this encounter.    No chief complaint on file.    History of Present Illness:    Morgan Fischer is a 69 y.o. female.  Patient has past medical history of essential hypertension, dyslipidemia, morbid obesity and hypertrophic cardiomyopathy.  She denies any problems at this time and takes care of activities of daily living.  No chest pain orthopnea or PND.  She leads a very sedentary lifestyle.  She is on oxygen and her oxygen needs have increased recently and she is seen a pulmonologist.  At the time of my  evaluation, the patient is alert awake oriented and in no distress.  Past Medical History:  Diagnosis Date  . Arthritis   . Asthma   . CHF (congestive heart failure) (HCC)   . COPD (chronic obstructive pulmonary disease) (HCC)   . Diabetes mellitus without complication (HCC)   . High cholesterol   . Hypertension   . Morbid obesity (HCC)     Past Surgical History:  Procedure Laterality Date  . ABDOMINAL HYSTERECTOMY    . CESAREAN SECTION      Current Medications: Current Meds  Medication Sig  . allopurinol (ZYLOPRIM) 300 MG tablet Take 300 mg by mouth daily.  Marland Kitchen amLODipine (NORVASC) 10 MG tablet Take 5 mg by mouth daily.   Marland Kitchen aspirin EC 81 MG EC tablet Take 1 tablet (81 mg total) by mouth daily.  Marland Kitchen BREO ELLIPTA 100-25 MCG/INH AEPB   . Calcium Carb-Cholecalciferol (CALCIUM-VITAMIN D3) 500-400 MG-UNIT TABS Take 1 tablet by mouth 2 (two) times daily.  . Cholecalciferol (VITAMIN D3) 2000 units capsule Take 1 capsule by mouth daily.  . Cyanocobalamin (VITAMIN B-12 PO) Take 1 tablet by mouth daily.  . furosemide (LASIX) 40 MG tablet TAKE 2 TABLETS(80 MG) BY MOUTH TWICE DAILY  . Garlic 1000 MG CAPS Take 1,000 mg by mouth daily.  Marland Kitchen glimepiride (AMARYL) 4 MG tablet Take 4 mg by mouth 2 (two) times daily.  Marland Kitchen ipratropium-albuterol (DUONEB) 0.5-2.5 (3) MG/3ML SOLN Use 1 vial in nebulizer every 4 hours as needed  . losartan (COZAAR) 100 MG tablet Take 1 tablet by  mouth daily.  . metFORMIN (GLUCOPHAGE) 500 MG tablet Take by mouth 2 (two) times daily with a meal.  . montelukast (SINGULAIR) 10 MG tablet Take 10 mg by mouth at bedtime.  . Multiple Vitamin (MULTIVITAMIN WITH MINERALS) TABS tablet Take 1 tablet by mouth daily.  . nitroGLYCERIN (NITROSTAT) 0.4 MG SL tablet Place 1 tablet (0.4 mg total) under the tongue every 5 (five) minutes as needed for chest pain.  . Omega-3 Fatty Acids (FISH OIL) 1000 MG CAPS Take 1,000 mg by mouth 2 (two) times daily.   . polyethylene glycol (MIRALAX /  GLYCOLAX) 17 g packet Take 17 g by mouth daily.  . potassium chloride (KLOR-CON) 10 MEQ tablet Take 20 mEq by mouth 2 (two) times daily. TAKE 2 TABLETS BY MOUTH TWICE DAILY.  . rosuvastatin (CRESTOR) 40 MG tablet Take 40 mg by mouth daily.  . Turmeric (QC TUMERIC COMPLEX) 500 MG CAPS Take 1 capsule by mouth daily.  . VENTOLIN HFA 108 (90 Base) MCG/ACT inhaler INHALE 2 PUFFS INTO THE LUNGS QID PRN     Allergies:   Atorvastatin, Ciprocinonide [fluocinolone], Catapres [clonidine hcl], and Demadex [torsemide]   Social History   Socioeconomic History  . Marital status: Widowed    Spouse name: Not on file  . Number of children: Not on file  . Years of education: Not on file  . Highest education level: Not on file  Occupational History  . Not on file  Tobacco Use  . Smoking status: Never Smoker  . Smokeless tobacco: Never Used  Substance and Sexual Activity  . Alcohol use: No  . Drug use: No  . Sexual activity: Never  Other Topics Concern  . Not on file  Social History Narrative  . Not on file   Social Determinants of Health   Financial Resource Strain:   . Difficulty of Paying Living Expenses: Not on file  Food Insecurity:   . Worried About Programme researcher, broadcasting/film/video in the Last Year: Not on file  . Ran Out of Food in the Last Year: Not on file  Transportation Needs:   . Lack of Transportation (Medical): Not on file  . Lack of Transportation (Non-Medical): Not on file  Physical Activity:   . Days of Exercise per Week: Not on file  . Minutes of Exercise per Session: Not on file  Stress:   . Feeling of Stress : Not on file  Social Connections:   . Frequency of Communication with Friends and Family: Not on file  . Frequency of Social Gatherings with Friends and Family: Not on file  . Attends Religious Services: Not on file  . Active Member of Clubs or Organizations: Not on file  . Attends Banker Meetings: Not on file  . Marital Status: Not on file     Family  History: The patient's family history includes Breast cancer in her sister; CAD in her mother; Cancer in her father and sister; Diabetes in her mother; Diabetes Mellitus I in her mother; Heart disease in her father.  ROS:   Please see the history of present illness.    All other systems reviewed and are negative.  EKGs/Labs/Other Studies Reviewed:    The following studies were reviewed today: I reviewed discussed results of echocardiogram with the patient at extensive length.   Recent Labs: 10/20/2018: B Natriuretic Peptide 671.3 10/24/2018: ALT 22; Hemoglobin 9.8; Magnesium 2.0; Platelets 125 11/01/2018: BUN 36; Creatinine, Ser 1.11; Potassium 3.9; Sodium 143  Recent Lipid Panel  Component Value Date/Time   CHOL 181 08/24/2015 0422   TRIG 100 08/24/2015 0422   HDL 43 08/24/2015 0422   CHOLHDL 4.2 08/24/2015 0422   VLDL 20 08/24/2015 0422   LDLCALC 118 (H) 08/24/2015 0422    Physical Exam:    VS:  BP 128/68   Pulse 73   Temp (!) 97.4 F (36.3 C)   Ht 5\' 3"  (1.6 m)   Wt 280 lb 12.8 oz (127.4 kg)   LMP  (LMP Unknown)   SpO2 99%   BMI 49.74 kg/m     Wt Readings from Last 3 Encounters:  02/13/19 280 lb 12.8 oz (127.4 kg)  12/24/18 289 lb 6.4 oz (131.3 kg)  11/01/18 291 lb (132 kg)     GEN: Patient is in no acute distress HEENT: Normal NECK: No JVD; No carotid bruits LYMPHATICS: No lymphadenopathy CARDIAC: Hear sounds regular, 2/6 systolic murmur at the apex. RESPIRATORY:  Clear to auscultation without rales, wheezing or rhonchi  ABDOMEN: Soft, non-tender, non-distended MUSCULOSKELETAL:  No edema; No deformity  SKIN: Warm and dry NEUROLOGIC:  Alert and oriented x 3 PSYCHIATRIC:  Normal affect   Signed, Jenean Lindau, MD  02/13/2019 10:48 AM    [place]

## 2019-02-13 NOTE — Patient Instructions (Signed)
Medication Instructions:  No medication changes *If you need a refill on your cardiac medications before your next appointment, please call your pharmacy*  Lab Work: None ordered If you have labs (blood work) drawn today and your tests are completely normal, you will receive your results only by: . MyChart Message (if you have MyChart) OR . A paper copy in the mail If you have any lab test that is abnormal or we need to change your treatment, we will call you to review the results.  Testing/Procedures: None ordered  Follow-Up: At [facility], you and your health needs are our priority.  As part of our continuing mission to provide you with exceptional heart care, we have created designated Provider Care Teams.  These Care Teams include your primary Cardiologist (physician) and Advanced Practice Providers (APPs -  Physician Assistants and Nurse Practitioners) who all work together to provide you with the care you need, when you need it.  Your next appointment:   3 month(s)  The format for your next appointment:   In Person  Provider:   Rajan Revankar, MD  Other Instructions NA  

## 2019-02-18 ENCOUNTER — Ambulatory Visit: Payer: Medicare HMO | Admitting: Cardiology

## 2019-02-26 DIAGNOSIS — E782 Mixed hyperlipidemia: Secondary | ICD-10-CM | POA: Diagnosis not present

## 2019-02-26 DIAGNOSIS — J45909 Unspecified asthma, uncomplicated: Secondary | ICD-10-CM | POA: Diagnosis not present

## 2019-02-26 DIAGNOSIS — I119 Hypertensive heart disease without heart failure: Secondary | ICD-10-CM | POA: Diagnosis not present

## 2019-02-26 DIAGNOSIS — I872 Venous insufficiency (chronic) (peripheral): Secondary | ICD-10-CM | POA: Diagnosis not present

## 2019-02-26 DIAGNOSIS — I10 Essential (primary) hypertension: Secondary | ICD-10-CM | POA: Diagnosis not present

## 2019-02-26 DIAGNOSIS — E611 Iron deficiency: Secondary | ICD-10-CM | POA: Diagnosis not present

## 2019-02-26 DIAGNOSIS — E1165 Type 2 diabetes mellitus with hyperglycemia: Secondary | ICD-10-CM | POA: Diagnosis not present

## 2019-02-28 DIAGNOSIS — J449 Chronic obstructive pulmonary disease, unspecified: Secondary | ICD-10-CM | POA: Diagnosis not present

## 2019-03-28 DIAGNOSIS — J449 Chronic obstructive pulmonary disease, unspecified: Secondary | ICD-10-CM | POA: Diagnosis not present

## 2019-04-04 ENCOUNTER — Other Ambulatory Visit: Payer: Self-pay

## 2019-04-04 NOTE — Telephone Encounter (Signed)
Called and spoke with pt. She states that she has a refill for now. I will discuss with Dr. Tomie China and send her a MyChart message on 04/07/19. Pt verbalized understanding and had no additional questions.

## 2019-04-24 DIAGNOSIS — L03116 Cellulitis of left lower limb: Secondary | ICD-10-CM | POA: Diagnosis not present

## 2019-04-24 DIAGNOSIS — I872 Venous insufficiency (chronic) (peripheral): Secondary | ICD-10-CM | POA: Diagnosis not present

## 2019-04-24 DIAGNOSIS — E782 Mixed hyperlipidemia: Secondary | ICD-10-CM | POA: Diagnosis not present

## 2019-04-24 DIAGNOSIS — E1165 Type 2 diabetes mellitus with hyperglycemia: Secondary | ICD-10-CM | POA: Diagnosis not present

## 2019-04-24 DIAGNOSIS — E611 Iron deficiency: Secondary | ICD-10-CM | POA: Diagnosis not present

## 2019-04-24 DIAGNOSIS — I119 Hypertensive heart disease without heart failure: Secondary | ICD-10-CM | POA: Diagnosis not present

## 2019-04-24 DIAGNOSIS — J45909 Unspecified asthma, uncomplicated: Secondary | ICD-10-CM | POA: Diagnosis not present

## 2019-04-24 DIAGNOSIS — I10 Essential (primary) hypertension: Secondary | ICD-10-CM | POA: Diagnosis not present

## 2019-04-24 DIAGNOSIS — L03115 Cellulitis of right lower limb: Secondary | ICD-10-CM | POA: Diagnosis not present

## 2019-04-25 ENCOUNTER — Other Ambulatory Visit: Payer: Self-pay | Admitting: Cardiology

## 2019-04-25 MED ORDER — FUROSEMIDE 40 MG PO TABS: ORAL_TABLET | ORAL | 3 refills | Status: DC

## 2019-04-25 MED ORDER — NITROGLYCERIN 0.4 MG SL SUBL: 0.4000 mg | SUBLINGUAL_TABLET | SUBLINGUAL | 6 refills | Status: DC | PRN

## 2019-04-25 NOTE — Telephone Encounter (Signed)
*  STAT* If patient is at the pharmacy, call can be transferred to refill team.   1. Which medications need to be refilled? (please list name of each medication and dose if known)  furosemide (LASIX) 40 MG tablet nitroGLYCERIN (NITROSTAT) 0.4 MG SL tablet  2. Which pharmacy/location (including street and city if local pharmacy) is medication to be sent to?  [id number] - [place], [place] - [address] AT [facility] [address]  3. Do they need a 30 day or 90 day supply? 90  Please see MyChart Message for dosage verification dated 04-04-19

## 2019-04-28 DIAGNOSIS — J449 Chronic obstructive pulmonary disease, unspecified: Secondary | ICD-10-CM | POA: Diagnosis not present

## 2019-05-06 DIAGNOSIS — I10 Essential (primary) hypertension: Secondary | ICD-10-CM | POA: Diagnosis not present

## 2019-05-06 DIAGNOSIS — L03115 Cellulitis of right lower limb: Secondary | ICD-10-CM | POA: Diagnosis not present

## 2019-05-06 DIAGNOSIS — L03116 Cellulitis of left lower limb: Secondary | ICD-10-CM | POA: Diagnosis not present

## 2019-05-06 DIAGNOSIS — I119 Hypertensive heart disease without heart failure: Secondary | ICD-10-CM | POA: Diagnosis not present

## 2019-05-06 DIAGNOSIS — E611 Iron deficiency: Secondary | ICD-10-CM | POA: Diagnosis not present

## 2019-05-06 DIAGNOSIS — J45909 Unspecified asthma, uncomplicated: Secondary | ICD-10-CM | POA: Diagnosis not present

## 2019-05-06 DIAGNOSIS — I872 Venous insufficiency (chronic) (peripheral): Secondary | ICD-10-CM | POA: Diagnosis not present

## 2019-05-06 DIAGNOSIS — E782 Mixed hyperlipidemia: Secondary | ICD-10-CM | POA: Diagnosis not present

## 2019-05-06 DIAGNOSIS — E1165 Type 2 diabetes mellitus with hyperglycemia: Secondary | ICD-10-CM | POA: Diagnosis not present

## 2019-05-08 ENCOUNTER — Ambulatory Visit: Payer: Medicare HMO | Admitting: Cardiology

## 2019-05-13 DIAGNOSIS — E782 Mixed hyperlipidemia: Secondary | ICD-10-CM | POA: Diagnosis not present

## 2019-05-13 DIAGNOSIS — I872 Venous insufficiency (chronic) (peripheral): Secondary | ICD-10-CM | POA: Diagnosis not present

## 2019-05-13 DIAGNOSIS — L03115 Cellulitis of right lower limb: Secondary | ICD-10-CM | POA: Diagnosis not present

## 2019-05-13 DIAGNOSIS — E1165 Type 2 diabetes mellitus with hyperglycemia: Secondary | ICD-10-CM | POA: Diagnosis not present

## 2019-05-13 DIAGNOSIS — E611 Iron deficiency: Secondary | ICD-10-CM | POA: Diagnosis not present

## 2019-05-13 DIAGNOSIS — I119 Hypertensive heart disease without heart failure: Secondary | ICD-10-CM | POA: Diagnosis not present

## 2019-05-13 DIAGNOSIS — I10 Essential (primary) hypertension: Secondary | ICD-10-CM | POA: Diagnosis not present

## 2019-05-13 DIAGNOSIS — L03116 Cellulitis of left lower limb: Secondary | ICD-10-CM | POA: Diagnosis not present

## 2019-05-13 DIAGNOSIS — J45909 Unspecified asthma, uncomplicated: Secondary | ICD-10-CM | POA: Diagnosis not present

## 2019-05-15 DIAGNOSIS — J449 Chronic obstructive pulmonary disease, unspecified: Secondary | ICD-10-CM | POA: Diagnosis not present

## 2019-05-15 DIAGNOSIS — J45909 Unspecified asthma, uncomplicated: Secondary | ICD-10-CM | POA: Diagnosis not present

## 2019-05-28 DIAGNOSIS — J449 Chronic obstructive pulmonary disease, unspecified: Secondary | ICD-10-CM | POA: Diagnosis not present

## 2019-06-04 ENCOUNTER — Ambulatory Visit: Payer: Medicare HMO | Admitting: Cardiology

## 2019-06-12 DIAGNOSIS — I2781 Cor pulmonale (chronic): Secondary | ICD-10-CM | POA: Diagnosis not present

## 2019-06-12 DIAGNOSIS — R0602 Shortness of breath: Secondary | ICD-10-CM | POA: Diagnosis not present

## 2019-06-12 DIAGNOSIS — E119 Type 2 diabetes mellitus without complications: Secondary | ICD-10-CM | POA: Diagnosis not present

## 2019-06-12 DIAGNOSIS — G4730 Sleep apnea, unspecified: Secondary | ICD-10-CM | POA: Diagnosis not present

## 2019-06-28 DIAGNOSIS — J449 Chronic obstructive pulmonary disease, unspecified: Secondary | ICD-10-CM | POA: Diagnosis not present

## 2019-07-03 DIAGNOSIS — Z0001 Encounter for general adult medical examination with abnormal findings: Secondary | ICD-10-CM | POA: Diagnosis not present

## 2019-07-03 DIAGNOSIS — I119 Hypertensive heart disease without heart failure: Secondary | ICD-10-CM | POA: Diagnosis not present

## 2019-07-03 DIAGNOSIS — E1165 Type 2 diabetes mellitus with hyperglycemia: Secondary | ICD-10-CM | POA: Diagnosis not present

## 2019-07-03 DIAGNOSIS — I10 Essential (primary) hypertension: Secondary | ICD-10-CM | POA: Diagnosis not present

## 2019-07-03 DIAGNOSIS — E782 Mixed hyperlipidemia: Secondary | ICD-10-CM | POA: Diagnosis not present

## 2019-07-03 DIAGNOSIS — I872 Venous insufficiency (chronic) (peripheral): Secondary | ICD-10-CM | POA: Diagnosis not present

## 2019-07-03 DIAGNOSIS — J45909 Unspecified asthma, uncomplicated: Secondary | ICD-10-CM | POA: Diagnosis not present

## 2019-07-03 DIAGNOSIS — E611 Iron deficiency: Secondary | ICD-10-CM | POA: Diagnosis not present

## 2019-07-14 ENCOUNTER — Emergency Department (HOSPITAL_BASED_OUTPATIENT_CLINIC_OR_DEPARTMENT_OTHER): Payer: Medicare HMO

## 2019-07-14 ENCOUNTER — Other Ambulatory Visit: Payer: Self-pay

## 2019-07-14 ENCOUNTER — Telehealth: Payer: Self-pay | Admitting: Cardiology

## 2019-07-14 ENCOUNTER — Inpatient Hospital Stay (HOSPITAL_BASED_OUTPATIENT_CLINIC_OR_DEPARTMENT_OTHER)
Admission: EM | Admit: 2019-07-14 | Discharge: 2019-07-21 | DRG: 291 | Disposition: A | Discharge status: Home / Self Care | Payer: Medicare HMO | Attending: Internal Medicine | Admitting: Internal Medicine

## 2019-07-14 ENCOUNTER — Encounter (HOSPITAL_BASED_OUTPATIENT_CLINIC_OR_DEPARTMENT_OTHER): Payer: Self-pay | Admitting: *Deleted

## 2019-07-14 DIAGNOSIS — Z6841 Body Mass Index (BMI) 40.0 and over, adult: Secondary | ICD-10-CM | POA: Diagnosis not present

## 2019-07-14 DIAGNOSIS — Z8249 Family history of ischemic heart disease and other diseases of the circulatory system: Secondary | ICD-10-CM

## 2019-07-14 DIAGNOSIS — I248 Other forms of acute ischemic heart disease: Secondary | ICD-10-CM | POA: Diagnosis present

## 2019-07-14 DIAGNOSIS — J9611 Chronic respiratory failure with hypoxia: Secondary | ICD-10-CM

## 2019-07-14 DIAGNOSIS — I472 Ventricular tachycardia: Secondary | ICD-10-CM | POA: Diagnosis not present

## 2019-07-14 DIAGNOSIS — R0789 Other chest pain: Secondary | ICD-10-CM | POA: Diagnosis not present

## 2019-07-14 DIAGNOSIS — E1165 Type 2 diabetes mellitus with hyperglycemia: Secondary | ICD-10-CM | POA: Diagnosis not present

## 2019-07-14 DIAGNOSIS — G4733 Obstructive sleep apnea (adult) (pediatric): Secondary | ICD-10-CM | POA: Diagnosis present

## 2019-07-14 DIAGNOSIS — N182 Chronic kidney disease, stage 2 (mild): Secondary | ICD-10-CM | POA: Diagnosis present

## 2019-07-14 DIAGNOSIS — E1122 Type 2 diabetes mellitus with diabetic chronic kidney disease: Secondary | ICD-10-CM | POA: Diagnosis present

## 2019-07-14 DIAGNOSIS — I422 Other hypertrophic cardiomyopathy: Secondary | ICD-10-CM | POA: Diagnosis present

## 2019-07-14 DIAGNOSIS — D631 Anemia in chronic kidney disease: Secondary | ICD-10-CM | POA: Diagnosis present

## 2019-07-14 DIAGNOSIS — R778 Other specified abnormalities of plasma proteins: Secondary | ICD-10-CM | POA: Diagnosis present

## 2019-07-14 DIAGNOSIS — Z7984 Long term (current) use of oral hypoglycemic drugs: Secondary | ICD-10-CM

## 2019-07-14 DIAGNOSIS — I2510 Atherosclerotic heart disease of native coronary artery without angina pectoris: Secondary | ICD-10-CM | POA: Diagnosis present

## 2019-07-14 DIAGNOSIS — N183 Chronic kidney disease, stage 3 unspecified: Secondary | ICD-10-CM

## 2019-07-14 DIAGNOSIS — Z79899 Other long term (current) drug therapy: Secondary | ICD-10-CM | POA: Diagnosis not present

## 2019-07-14 DIAGNOSIS — E7800 Pure hypercholesterolemia, unspecified: Secondary | ICD-10-CM | POA: Diagnosis present

## 2019-07-14 DIAGNOSIS — I517 Cardiomegaly: Secondary | ICD-10-CM | POA: Diagnosis not present

## 2019-07-14 DIAGNOSIS — Z20822 Contact with and (suspected) exposure to covid-19: Secondary | ICD-10-CM | POA: Diagnosis present

## 2019-07-14 DIAGNOSIS — R079 Chest pain, unspecified: Secondary | ICD-10-CM | POA: Diagnosis present

## 2019-07-14 DIAGNOSIS — I5031 Acute diastolic (congestive) heart failure: Secondary | ICD-10-CM | POA: Diagnosis not present

## 2019-07-14 DIAGNOSIS — Z833 Family history of diabetes mellitus: Secondary | ICD-10-CM | POA: Diagnosis not present

## 2019-07-14 DIAGNOSIS — J449 Chronic obstructive pulmonary disease, unspecified: Secondary | ICD-10-CM | POA: Diagnosis present

## 2019-07-14 DIAGNOSIS — Z888 Allergy status to other drugs, medicaments and biological substances status: Secondary | ICD-10-CM

## 2019-07-14 DIAGNOSIS — N1831 Chronic kidney disease, stage 3a: Secondary | ICD-10-CM | POA: Diagnosis not present

## 2019-07-14 DIAGNOSIS — M109 Gout, unspecified: Secondary | ICD-10-CM | POA: Diagnosis present

## 2019-07-14 DIAGNOSIS — R7989 Other specified abnormal findings of blood chemistry: Secondary | ICD-10-CM | POA: Diagnosis present

## 2019-07-14 DIAGNOSIS — I5033 Acute on chronic diastolic (congestive) heart failure: Secondary | ICD-10-CM | POA: Diagnosis present

## 2019-07-14 DIAGNOSIS — Z7982 Long term (current) use of aspirin: Secondary | ICD-10-CM | POA: Diagnosis not present

## 2019-07-14 DIAGNOSIS — E785 Hyperlipidemia, unspecified: Secondary | ICD-10-CM | POA: Diagnosis present

## 2019-07-14 DIAGNOSIS — R0602 Shortness of breath: Secondary | ICD-10-CM | POA: Diagnosis not present

## 2019-07-14 DIAGNOSIS — E119 Type 2 diabetes mellitus without complications: Secondary | ICD-10-CM

## 2019-07-14 DIAGNOSIS — I421 Obstructive hypertrophic cardiomyopathy: Secondary | ICD-10-CM | POA: Diagnosis present

## 2019-07-14 DIAGNOSIS — I350 Nonrheumatic aortic (valve) stenosis: Secondary | ICD-10-CM | POA: Diagnosis not present

## 2019-07-14 DIAGNOSIS — N189 Chronic kidney disease, unspecified: Secondary | ICD-10-CM

## 2019-07-14 DIAGNOSIS — I10 Essential (primary) hypertension: Secondary | ICD-10-CM | POA: Diagnosis not present

## 2019-07-14 DIAGNOSIS — I130 Hypertensive heart and chronic kidney disease with heart failure and stage 1 through stage 4 chronic kidney disease, or unspecified chronic kidney disease: Secondary | ICD-10-CM | POA: Diagnosis present

## 2019-07-14 HISTORY — DX: Chest pain, unspecified: R07.9

## 2019-07-14 LAB — CBC
HCT: 36.6 % (ref 36.0–46.0)
Hemoglobin: 10.8 g/dL — ABNORMAL LOW (ref 12.0–15.0)
MCH: 26.7 pg (ref 26.0–34.0)
MCHC: 29.5 g/dL — ABNORMAL LOW (ref 30.0–36.0)
MCV: 90.6 fL (ref 80.0–100.0)
Platelets: 168 10*3/uL (ref 150–400)
RBC: 4.04 MIL/uL (ref 3.87–5.11)
RDW: 16.3 % — ABNORMAL HIGH (ref 11.5–15.5)
WBC: 6.1 10*3/uL (ref 4.0–10.5)
nRBC: 0 % (ref 0.0–0.2)

## 2019-07-14 LAB — BASIC METABOLIC PANEL
Anion gap: 15 (ref 5–15)
BUN: 37 mg/dL — ABNORMAL HIGH (ref 8–23)
CO2: 36 mmol/L — ABNORMAL HIGH (ref 22–32)
Calcium: 10 mg/dL (ref 8.9–10.3)
Chloride: 90 mmol/L — ABNORMAL LOW (ref 98–111)
Creatinine, Ser: 0.96 mg/dL (ref 0.44–1.00)
GFR calc Af Amer: >60 mL/min (ref >60)
GFR calc non Af Amer: 60 mL/min — ABNORMAL LOW (ref >60)
Glucose, Bld: 206 mg/dL — ABNORMAL HIGH (ref 70–99)
Potassium: 3.7 mmol/L (ref 3.5–5.1)
Sodium: 141 mmol/L (ref 135–145)

## 2019-07-14 LAB — SARS CORONAVIRUS 2 BY RT PCR (HOSPITAL ORDER, PERFORMED IN ~~LOC~~ HOSPITAL LAB): SARS Coronavirus 2: NEGATIVE

## 2019-07-14 LAB — TROPONIN I (HIGH SENSITIVITY)
Troponin I (High Sensitivity): 35 ng/L — ABNORMAL HIGH (ref <18)
Troponin I (High Sensitivity): 48 ng/L — ABNORMAL HIGH (ref <18)
Troponin I (High Sensitivity): 47 ng/L — ABNORMAL HIGH (ref <18)

## 2019-07-14 LAB — BRAIN NATRIURETIC PEPTIDE: B Natriuretic Peptide: 329.2 pg/mL — ABNORMAL HIGH (ref 0.0–100.0)

## 2019-07-14 MED ORDER — ASPIRIN 325 MG PO TABS
325.0000 mg | ORAL_TABLET | Freq: Once | ORAL | Status: AC
  Administered 2019-07-14: 325 mg via ORAL
  Filled 2019-07-14: qty 1

## 2019-07-14 MED ORDER — SODIUM CHLORIDE 0.9% FLUSH
3.0000 mL | Freq: Once | INTRAVENOUS | Status: DC
  Filled 2019-07-14: qty 3

## 2019-07-14 NOTE — ED Notes (Signed)
Per Pt. She had no chest pain this morning but her L shoulder / arm started hurting this morning at 10:00.  Pt. Is reporting that last night she was having pain in her chest last PM and took Mylanta.  Pt. Reports she has had pain meds for 4 days.

## 2019-07-14 NOTE — ED Notes (Signed)
Pt. Can't sit back in the bed and is unable to lift her legs up in the bed due to she cant breath if she does.  Pt. Reports she is more comfortable if she sits on the edge of bed.

## 2019-07-14 NOTE — Telephone Encounter (Signed)
Pt offered an appointment today at [facility] but states that she doesn't have a ride. Pt advised to take Nitroglycerin as needed for chest pain. Pt advised to call 911 or go directly to the ED for any chest pain that radiates down the lt arm, neck or jaw, if she has n/v, shortness of breath or diaphorsis. Pt verbalized understanding and had no additional questions.

## 2019-07-14 NOTE — Telephone Encounter (Signed)
Pt c/o of Chest Pain: STAT if CP now or developed within 24 hours  1. Are you having CP right now? no  2. Are you experiencing any other symptoms (ex. SOB, nausea, vomiting, sweating)? no  3. How long have you been experiencing CP? All this past week  4. Is your CP continuous or coming and going? Comes and goes  5. Have you taken Nitroglycerin? no   Patient states all last week she had pain in the left side of her chest and left arm. She states Tums helps and when she took Mylanta it went away. She states she did have a lot of gas and is not sure if the pain was from that. She states she is not having any symptoms now and did not have any last night. Please advise.

## 2019-07-14 NOTE — ED Triage Notes (Signed)
Chest pain off and on  x 3 days. She took Mylanta yesterday with relief. She is on home oxygen. Left arm today.

## 2019-07-14 NOTE — ED Provider Notes (Signed)
[facility] Provider Note   CSN: [id number] Arrival date & time: 07/14/19  1341     History Chief Complaint  Patient presents with  . Chest Pain    Morgan Fischer is a 69 y.o. female history of hypertrophic cardiomyopathy, COPD on 3 L nasal cannula here presenting with chest pain .  Patient states that she is on 3 L at all times.  She states that her shortness of breath is stable.  She states that since yesterday she has some left-sided chest pain. She states that it is a soreness in her chest .  She states that this morning it started to radiate down her left arm.  She states that this is unusual for her.  She has known hypertrophic cardiomyopathy but did not have any stents in her heart.  Her cardiologist is Dr. Geraldo Pitter.  She has no pain presently.  The history is provided by the patient.       Past Medical History:  Diagnosis Date  . Arthritis   . Asthma   . CHF (congestive heart failure) ([place])   . COPD (chronic obstructive pulmonary disease) ([place])   . Diabetes mellitus without complication ([place])   . High cholesterol   . Hypertension   . Morbid obesity [facility])     Patient Active Problem List   Diagnosis Date Noted  . Aortic stenosis 11/01/2018  . Heart block AV complete ([place])   . HOCM (hypertrophic obstructive cardiomyopathy) ([place])   . Essential hypertension   . Acute on chronic respiratory failure with hypoxia ([place]) 10/20/2018  . Acute hypercapnic respiratory failure ([place]) 07/21/2016  . Drug allergy 07/21/2016  . Acute on chronic congestive heart failure ([place]) 07/19/2016  . Acute respiratory acidosis 07/19/2016  . Acute respiratory distress 07/19/2016  . Chronic obstructive pulmonary disease with acute exacerbation ([place]) 07/19/2016  . Hyperglycemia due to type 2 diabetes mellitus ([place]) 07/19/2016  . Leukocytosis 07/19/2016  . Chronic diastolic heart failure ([place]) 03/17/2016  . Hypertensive heart disease with heart failure ([place])  03/17/2016  . Hypoxia 03/17/2016  . Aortic atherosclerosis ([place]) 03/17/2016  . Nonrheumatic aortic valve stenosis 03/17/2016  . Elevated troponin 08/24/2015  . SOB (shortness of breath) 08/24/2015  . Hypokalemia 08/24/2015  . Morbid obesity ([place])   . Secondary hypertension   . Diabetes mellitus without complication ([place])   . Asthma   . Hypomagnesemia   . Panniculitis 08/23/2015    Past Surgical History:  Procedure Laterality Date  . ABDOMINAL HYSTERECTOMY    . CESAREAN SECTION       OB History   No obstetric history on file.     Family History  Problem Relation Age of Onset  . Diabetes Mellitus I Mother   . CAD Mother   . Diabetes Mother   . Heart disease Father   . Cancer Father   . Breast cancer Sister   . Cancer Sister     Social History   Tobacco Use  . Smoking status: Never Smoker  . Smokeless tobacco: Never Used  Vaping Use  . Vaping Use: Never used  Substance Use Topics  . Alcohol use: No  . Drug use: No    Home Medications Prior to Admission medications   Medication Sig Start Date End Date Taking? Authorizing Provider  allopurinol (ZYLOPRIM) 300 MG tablet Take 300 mg by mouth daily.    [provider]  amLODipine (NORVASC) 10 MG tablet Take 5 mg by mouth daily.  10/30/18   [provider]  aspirin EC 81 MG EC tablet Take 1 tablet (81 mg total) by mouth daily. 10/25/18   Lavina Hamman, MD  BD PEN NEEDLE NANO 2ND GEN 32G X 4 MM MISC  12/31/18   [provider]  Blood Glucose Monitoring Suppl (ACCU-CHEK AVIVA PLUS) w/Device KIT  12/20/18   [provider]  [place] Patter 100-25 MCG/INH AEPB  11/07/18   [provider]  Calcium Carb-Cholecalciferol (CALCIUM-VITAMIN D3) 500-400 MG-UNIT TABS Take 1 tablet by mouth 2 (two) times daily.    [provider]  Cholecalciferol (VITAMIN D3) 2000 units capsule Take 1 capsule by mouth daily.    Bonsu, Osei A, DO  Cyanocobalamin (VITAMIN B-12 PO) Take 1 tablet by  mouth daily.    [provider]  furosemide (LASIX) 40 MG tablet TAKE 2 TABLETS(80 MG) BY MOUTH TWICE DAILY 04/25/19   Revankar, Reita Cliche, MD  Garlic 3151 MG CAPS Take 1,000 mg by mouth daily.    [provider]  glimepiride (AMARYL) 4 MG tablet Take 4 mg by mouth 2 (two) times daily.    [provider]  ipratropium-albuterol (DUONEB) 0.5-2.5 (3) MG/3ML SOLN Use 1 vial in nebulizer every 4 hours as needed    Gwenevere Ghazi, MD  LANTUS SOLOSTAR 100 UNIT/ML Solostar Pen  01/21/19   [provider]  losartan (COZAAR) 100 MG tablet Take 1 tablet by mouth daily.    Bonsu, Osei A, DO  metFORMIN (GLUCOPHAGE) 500 MG tablet Take by mouth 2 (two) times daily with a meal.    [provider]  metoprolol succinate (TOPROL-XL) 50 MG 24 hr tablet  02/06/19   [provider]  montelukast (SINGULAIR) 10 MG tablet Take 10 mg by mouth at bedtime.    [provider]  Multiple Vitamin (MULTIVITAMIN WITH MINERALS) TABS tablet Take 1 tablet by mouth daily.    [provider]  nitroGLYCERIN (NITROSTAT) 0.4 MG SL tablet Place 1 tablet (0.4 mg total) under the tongue every 5 (five) minutes as needed for chest pain. 04/25/19   Revankar, Reita Cliche, MD  Omega-3 Fatty Acids (FISH OIL) 1000 MG CAPS Take 1,000 mg by mouth 2 (two) times daily.     [provider]  polyethylene glycol (MIRALAX / GLYCOLAX) 17 g packet Take 17 g by mouth daily. 10/25/18   Lavina Hamman, MD  potassium chloride (KLOR-CON) 10 MEQ tablet Take 20 mEq by mouth 2 (two) times daily. TAKE 2 TABLETS BY MOUTH TWICE DAILY.    [provider]  rosuvastatin (CRESTOR) 40 MG tablet Take 40 mg by mouth daily.    [provider]  Turmeric (QC TUMERIC COMPLEX) 500 MG CAPS Take 1 capsule by mouth daily.    [provider]  VENTOLIN HFA 108 (90 Base) MCG/ACT inhaler INHALE 2 PUFFS INTO THE LUNGS QID PRN 07/03/18   [provider]  Vitamins/Minerals TABS Take by  mouth.    [provider]    Allergies    Atorvastatin, Ciprocinonide [fluocinolone], Catapres [clonidine hcl], and Demadex [torsemide]  Review of Systems   Review of Systems  Cardiovascular: Positive for chest pain.  All other systems reviewed and are negative.   Physical Exam Updated Vital Signs BP (!) 158/65 (BP Location: Right Wrist)   Pulse 72   Temp 98.8 F (37.1 C) (Oral)   Resp 20   Ht _0  (1.6 m)   Wt 130.6 kg   LMP  (LMP Unknown)   SpO2 100%  BMI 51.02 kg/m   Physical Exam Vitals and nursing note reviewed.  HENT:     Head: Normocephalic.  Eyes:     Pupils: Pupils are equal, round, and reactive to light.  Cardiovascular:     Rate and Rhythm: Normal rate and regular rhythm.     Comments: Systolic murmur loudest at the LUSB Abdominal:     General: Bowel sounds are normal.     Palpations: Abdomen is soft.  Musculoskeletal:        General: Normal range of motion.     Cervical back: Normal range of motion.  Skin:    General: Skin is warm.     Capillary Refill: Capillary refill takes less than 2 seconds.  Neurological:     General: No focal deficit present.     Mental Status: She is alert and oriented to person, place, and time.  Psychiatric:        Mood and Affect: Mood normal.        Behavior: Behavior normal.     ED Results / Procedures / Treatments   Labs (all labs ordered are listed, but only abnormal results are displayed) Labs Reviewed  BASIC METABOLIC PANEL - Abnormal; Notable for the following components:      Result Value   Chloride 90 (*)    CO2 36 (*)    Glucose, Bld 206 (*)    BUN 37 (*)    GFR calc non Af Amer 60 (*)    All other components within normal limits  CBC - Abnormal; Notable for the following components:   Hemoglobin 10.8 (*)    MCHC 29.5 (*)    RDW 16.3 (*)    All other components within normal limits  BRAIN NATRIURETIC PEPTIDE - Abnormal; Notable for the following components:   B Natriuretic Peptide 329.2  (*)    All other components within normal limits  TROPONIN I (HIGH SENSITIVITY) - Abnormal; Notable for the following components:   Troponin I (High Sensitivity) 35 (*)    All other components within normal limits  TROPONIN I (HIGH SENSITIVITY) - Abnormal; Notable for the following components:   Troponin I (High Sensitivity) 48 (*)    All other components within normal limits    EKG EKG Interpretation  Date/Time:  Monday July 14 2019 14:03:28 EDT Ventricular Rate:  84 PR Interval:  166 QRS Duration: 104 QT Interval:  396 QTC Calculation: 467 R Axis:   41 Text Interpretation: Sinus rhythm with marked sinus arrhythmia Left ventricular hypertrophy with repolarization abnormality ( Sokolow-Lyon ) Abnormal ECG nonspecific changes since previous Confirmed by Wandra Arthurs [phone]) on 07/14/2019 7:18:13 PM   Radiology DG Chest 2 View  Result Date: 07/14/2019 CLINICAL DATA:  Chest pain EXAM: CHEST - 2 VIEW COMPARISON:  October 20, 2018 and chest CT July 21, 2016 FINDINGS: The lungs are clear. Heart is mildly enlarged. There is prominence of central pulmonary arteries with fairly rapid peripheral tapering. The interstitium is mildly prominent with suspected mild interstitial edema. No adenopathy appreciable. There is degenerative change in the thoracic spine. IMPRESSION: Mild cardiomegaly with pulmonary arterial hypertension. Slight interstitial edema. No consolidation. Electronically Signed   By: Lowella Grip III M.D.   On: 07/14/2019 14:32    Procedures Procedures (including critical care time)  Medications Ordered in ED Medications  sodium chloride flush (NS) 0.9 % injection 3 mL (3 mLs Intravenous Not Given 07/14/19 1847)  aspirin tablet 325 mg (325 mg Oral Given 07/14/19 2000)  ED Course  I have reviewed the triage vital signs and the nursing notes.  Pertinent labs & imaging results that were available during my care of the patient were reviewed by me and considered in my  medical decision making (see chart for details).    MDM Rules/Calculators/A&P                          Morgan Fischer is a 69 y.o. female hx HOCM here with chest pain, arm pain. Consider ACS vs worsening HOCM. Will get labs, BNP, Trop x 2, CXR.   8:10 PM Initial trop was 35 then repeat trop is 48. Called Dr. Ailene Ravel from cardiology.  He agreed with aspirin and hold heparin since she is pain-free.  Recommend hospitalist admission and he will see patient as a consult.  Patient likely will need a repeat echo as well as trending troponin.    Final Clinical Impression(s) / ED Diagnoses Final diagnoses:  None    Rx / DC Orders ED Discharge Orders    None       Drenda Freeze, MD 07/14/19 2013

## 2019-07-15 ENCOUNTER — Encounter (HOSPITAL_COMMUNITY): Payer: Self-pay | Admitting: Internal Medicine

## 2019-07-15 DIAGNOSIS — R7989 Other specified abnormal findings of blood chemistry: Secondary | ICD-10-CM

## 2019-07-15 DIAGNOSIS — I421 Obstructive hypertrophic cardiomyopathy: Secondary | ICD-10-CM

## 2019-07-15 DIAGNOSIS — N189 Chronic kidney disease, unspecified: Secondary | ICD-10-CM

## 2019-07-15 DIAGNOSIS — I5033 Acute on chronic diastolic (congestive) heart failure: Secondary | ICD-10-CM

## 2019-07-15 DIAGNOSIS — J9611 Chronic respiratory failure with hypoxia: Secondary | ICD-10-CM

## 2019-07-15 LAB — COMPREHENSIVE METABOLIC PANEL
ALT: 20 U/L (ref 0–44)
AST: 21 U/L (ref 15–41)
Albumin: 4.1 g/dL (ref 3.5–5.0)
Alkaline Phosphatase: 64 U/L (ref 38–126)
Anion gap: 13 (ref 5–15)
BUN: 30 mg/dL — ABNORMAL HIGH (ref 8–23)
CO2: 36 mmol/L — ABNORMAL HIGH (ref 22–32)
Calcium: 10.1 mg/dL (ref 8.9–10.3)
Chloride: 94 mmol/L — ABNORMAL LOW (ref 98–111)
Creatinine, Ser: 0.81 mg/dL (ref 0.44–1.00)
GFR calc Af Amer: >60 mL/min (ref >60)
GFR calc non Af Amer: >60 mL/min (ref >60)
Glucose, Bld: 118 mg/dL — ABNORMAL HIGH (ref 70–99)
Potassium: 3.7 mmol/L (ref 3.5–5.1)
Sodium: 143 mmol/L (ref 135–145)
Total Bilirubin: 0.6 mg/dL (ref 0.3–1.2)
Total Protein: 7.4 g/dL (ref 6.5–8.1)

## 2019-07-15 LAB — HEMOGLOBIN A1C
Hgb A1c MFr Bld: 8.2 % — ABNORMAL HIGH (ref 4.8–5.6)
Mean Plasma Glucose: 188.64 mg/dL

## 2019-07-15 LAB — MAGNESIUM: Magnesium: 2.4 mg/dL (ref 1.7–2.4)

## 2019-07-15 LAB — CBC
HCT: 37.5 % (ref 36.0–46.0)
Hemoglobin: 11 g/dL — ABNORMAL LOW (ref 12.0–15.0)
MCH: 26.4 pg (ref 26.0–34.0)
MCHC: 29.3 g/dL — ABNORMAL LOW (ref 30.0–36.0)
MCV: 89.9 fL (ref 80.0–100.0)
Platelets: 161 10*3/uL (ref 150–400)
RBC: 4.17 MIL/uL (ref 3.87–5.11)
RDW: 16.4 % — ABNORMAL HIGH (ref 11.5–15.5)
WBC: 5.7 10*3/uL (ref 4.0–10.5)
nRBC: 0 % (ref 0.0–0.2)

## 2019-07-15 LAB — GLUCOSE, CAPILLARY: Glucose-Capillary: 214 mg/dL — ABNORMAL HIGH (ref 70–99)

## 2019-07-15 LAB — TSH: TSH: 3.422 u[IU]/mL (ref 0.350–4.500)

## 2019-07-15 LAB — TROPONIN I (HIGH SENSITIVITY)
Troponin I (High Sensitivity): 40 ng/L — ABNORMAL HIGH (ref <18)
Troponin I (High Sensitivity): 38 ng/L — ABNORMAL HIGH (ref <18)
Troponin I (High Sensitivity): 33 ng/L — ABNORMAL HIGH (ref <18)

## 2019-07-15 LAB — D-DIMER, QUANTITATIVE: D-Dimer, Quant: 0.43 ug/mL-FEU (ref 0.00–0.50)

## 2019-07-15 LAB — CBG MONITORING, ED: Glucose-Capillary: 136 mg/dL — ABNORMAL HIGH (ref 70–99)

## 2019-07-15 MED ORDER — ONDANSETRON HCL 4 MG/2ML IJ SOLN: 4.0000 mg | Freq: Four times a day (QID) | INTRAMUSCULAR | Status: DC | PRN

## 2019-07-15 MED ORDER — FERROUS SULFATE 325 (65 FE) MG PO TABS
325.0000 mg | ORAL_TABLET | Freq: Every day | ORAL | Status: DC
  Administered 2019-07-15 – 2019-07-21 (×7): 325 mg via ORAL
  Filled 2019-07-15 (×7): qty 1

## 2019-07-15 MED ORDER — NITROGLYCERIN 0.4 MG SL SUBL: 0.4000 mg | SUBLINGUAL_TABLET | SUBLINGUAL | Status: DC | PRN

## 2019-07-15 MED ORDER — ACETAMINOPHEN 325 MG PO TABS
650.0000 mg | ORAL_TABLET | Freq: Four times a day (QID) | ORAL | Status: DC | PRN
  Administered 2019-07-18 – 2019-07-21 (×5): 650 mg via ORAL
  Filled 2019-07-15 (×5): qty 2

## 2019-07-15 MED ORDER — ENOXAPARIN SODIUM 40 MG/0.4ML ~~LOC~~ SOLN
40.0000 mg | SUBCUTANEOUS | Status: DC
  Administered 2019-07-16: 40 mg via SUBCUTANEOUS
  Filled 2019-07-15: qty 0.4

## 2019-07-15 MED ORDER — ALBUTEROL SULFATE (2.5 MG/3ML) 0.083% IN NEBU: 2.5000 mg | INHALATION_SOLUTION | Freq: Four times a day (QID) | RESPIRATORY_TRACT | Status: DC | PRN

## 2019-07-15 MED ORDER — ALBUTEROL SULFATE (2.5 MG/3ML) 0.083% IN NEBU
2.5000 mg | INHALATION_SOLUTION | RESPIRATORY_TRACT | Status: DC | PRN
  Administered 2019-07-15: 2.5 mg via RESPIRATORY_TRACT
  Filled 2019-07-15: qty 3

## 2019-07-15 MED ORDER — INSULIN ASPART 100 UNIT/ML ~~LOC~~ SOLN
0.0000 [IU] | Freq: Every day | SUBCUTANEOUS | Status: DC
  Administered 2019-07-15: 2 [IU] via SUBCUTANEOUS

## 2019-07-15 MED ORDER — MONTELUKAST SODIUM 10 MG PO TABS
10.0000 mg | ORAL_TABLET | Freq: Every day | ORAL | Status: DC
  Administered 2019-07-15 – 2019-07-20 (×6): 10 mg via ORAL
  Filled 2019-07-15 (×6): qty 1

## 2019-07-15 MED ORDER — INSULIN GLARGINE 100 UNIT/ML ~~LOC~~ SOLN
15.0000 [IU] | Freq: Every day | SUBCUTANEOUS | Status: DC
  Administered 2019-07-15 – 2019-07-16 (×2): 15 [IU] via SUBCUTANEOUS
  Filled 2019-07-15 (×2): qty 0.15

## 2019-07-15 MED ORDER — ASPIRIN EC 81 MG PO TBEC
81.0000 mg | DELAYED_RELEASE_TABLET | Freq: Every day | ORAL | Status: DC
  Administered 2019-07-15 – 2019-07-21 (×7): 81 mg via ORAL
  Filled 2019-07-15 (×7): qty 1

## 2019-07-15 MED ORDER — ALBUTEROL SULFATE HFA 108 (90 BASE) MCG/ACT IN AERS: 2.0000 | INHALATION_SPRAY | Freq: Four times a day (QID) | RESPIRATORY_TRACT | Status: DC | PRN

## 2019-07-15 MED ORDER — ACETAMINOPHEN 650 MG RE SUPP: 650.0000 mg | Freq: Four times a day (QID) | RECTAL | Status: DC | PRN

## 2019-07-15 MED ORDER — FLUTICASONE FUROATE-VILANTEROL 100-25 MCG/INH IN AEPB
1.0000 | INHALATION_SPRAY | Freq: Every day | RESPIRATORY_TRACT | Status: DC
  Administered 2019-07-16 – 2019-07-21 (×6): 1 via RESPIRATORY_TRACT
  Filled 2019-07-15: qty 28

## 2019-07-15 MED ORDER — ALLOPURINOL 100 MG PO TABS
300.0000 mg | ORAL_TABLET | Freq: Every day | ORAL | Status: DC
  Administered 2019-07-15 – 2019-07-21 (×7): 300 mg via ORAL
  Filled 2019-07-15 (×7): qty 3

## 2019-07-15 MED ORDER — FUROSEMIDE 10 MG/ML IJ SOLN: 40.0000 mg | Freq: Every day | INTRAMUSCULAR | Status: DC

## 2019-07-15 MED ORDER — LOSARTAN POTASSIUM 50 MG PO TABS
100.0000 mg | ORAL_TABLET | Freq: Every day | ORAL | Status: DC
  Administered 2019-07-15 – 2019-07-21 (×7): 100 mg via ORAL
  Filled 2019-07-15 (×7): qty 2

## 2019-07-15 MED ORDER — ONDANSETRON HCL 4 MG PO TABS: 4.0000 mg | ORAL_TABLET | Freq: Four times a day (QID) | ORAL | Status: DC | PRN

## 2019-07-15 MED ORDER — INSULIN ASPART 100 UNIT/ML ~~LOC~~ SOLN
0.0000 [IU] | Freq: Three times a day (TID) | SUBCUTANEOUS | Status: DC
  Administered 2019-07-16: 5 [IU] via SUBCUTANEOUS

## 2019-07-15 MED ORDER — ROSUVASTATIN CALCIUM 5 MG PO TABS
40.0000 mg | ORAL_TABLET | Freq: Every day | ORAL | Status: DC
  Administered 2019-07-15 – 2019-07-21 (×7): 40 mg via ORAL
  Filled 2019-07-15 (×7): qty 8

## 2019-07-15 MED ORDER — FUROSEMIDE 10 MG/ML IJ SOLN
40.0000 mg | Freq: Two times a day (BID) | INTRAMUSCULAR | Status: DC
  Administered 2019-07-16 – 2019-07-17 (×3): 40 mg via INTRAVENOUS
  Filled 2019-07-15 (×3): qty 4

## 2019-07-15 MED ORDER — IPRATROPIUM-ALBUTEROL 0.5-2.5 (3) MG/3ML IN SOLN: 3.0000 mL | RESPIRATORY_TRACT | Status: DC | PRN

## 2019-07-15 MED ORDER — AMLODIPINE BESYLATE 5 MG PO TABS
5.0000 mg | ORAL_TABLET | Freq: Every day | ORAL | Status: DC
  Administered 2019-07-15 – 2019-07-17 (×3): 5 mg via ORAL
  Filled 2019-07-15 (×3): qty 1

## 2019-07-15 MED ORDER — METOPROLOL SUCCINATE ER 25 MG PO TB24
50.0000 mg | ORAL_TABLET | Freq: Every day | ORAL | Status: DC
  Administered 2019-07-15 – 2019-07-19 (×5): 50 mg via ORAL
  Filled 2019-07-15 (×5): qty 2

## 2019-07-15 MED ORDER — FUROSEMIDE 10 MG/ML IJ SOLN: 40.0000 mg | Freq: Two times a day (BID) | INTRAMUSCULAR | Status: DC

## 2019-07-15 MED ORDER — MORPHINE SULFATE (PF) 2 MG/ML IV SOLN: 2.0000 mg | INTRAVENOUS | Status: DC | PRN

## 2019-07-15 NOTE — Care Management CC44 (Signed)
Condition Code 44 Documentation Completed  Patient Details  Name: Morgan Fischer MRN: [id number] Date of Birth: 05/06/1950   Condition Code 44 given:  Yes Patient signature on Condition Code 44 notice:  Yes Documentation of 2 MD's agreement:  Yes Code 44 added to claim:  Yes    Leone Haven, RN 07/15/2019, 4:52 PM

## 2019-07-15 NOTE — ED Notes (Signed)
Report provided to [facility] for [facility] ED transfer awaiting inpatient bed assignment.

## 2019-07-15 NOTE — ED Notes (Signed)
Pt taken off Bipap. Placed on 3lpm [place] in which she wears at home. No distress noted. Patient sitting up on side of the bed.

## 2019-07-15 NOTE — Progress Notes (Signed)
Pt placed on [place] BIPAP mode 22/14 patient states this is her home setting with 3 liters bled in.  Patient states she feels some tightness in her chest and feels she would benefit  from a nebulizer treatment.  I advised patient I will give her a treatment inline through BIPAP tubing.

## 2019-07-15 NOTE — Consult Note (Addendum)
Cardiology Consultation:   Patient ID: Morgan Fischer MRN: [id number]; DOB: Oct 02, 1950  Admit date: 07/14/2019 Date of Consult: 07/15/2019  Primary Care Provider: Benito Mccreedy, MD [facility] Cardiologist: Jenean Lindau, MD  [facility] Electrophysiologist:  None    Patient Profile:   Morgan Fischer is a 69 y.o. female with a hx of hypertrophic cardiomyopathy, HTN, 02 dependent, COPD, Asthma, HLD, DM-2  who is being seen today for the evaluation of chest pain  at the request of Dr. Doristine Bosworth.  History of Present Illness:   Morgan Fischer with above hx and followed by pulmonary as well [facility] 07[facility]14[facility]19 with lt arm and shoulder pain for 3 days.  Her dyspnea was significant, she could only sit on side of bed due to SOB.   Now transferred to [facility] ER   [facility] hospitalist admitting.   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 RV is normal, LA moderately dilated. Tr MR, mild TR, mild AS.  Mild pulmonic regurg. Moderately elevated pulmonary artery systolic pressure.   Holter monitor 12/2018 with average HR 71, max at 119 and slow 57.  brif atrial funs longest 12 sec.  Rare PVC.     Hx of mild coronary artery calcification on CTA of chest 07/2016.   2V CXR IMPRESSION: Mild cardiomegaly with pulmonary arterial hypertension. Slight interstitial edema. No consolidation.  EKG:  The EKG was personally reviewed and demonstrates:  SR with LVH PAC and deeper T wave inversion in lateral leads from prior Telemetry:  Telemetry was personally reviewed and demonstrates:  SR with 3 beats of NSVT   Labs hs Troponin 35,48, 47, 40 all flat BNP 329 Hgb 10.8 WBC 6.1 plts 168 Na 147, K+ 3.7, cl 90 CO2 36 BUN 37, Cr 0.96   Currently  BP from 97/59 to 157/73 P 60-70 and Resp 23-26  Currently no pain at all, sitting in chair due to respirations. Easier sitting up.    Past Medical History:   Diagnosis Date  . Arthritis   . Asthma   . CHF (congestive heart failure) ([place])   . COPD (chronic obstructive pulmonary disease) ([place])   . Diabetes mellitus without complication ([place])   . High cholesterol   . Hypertension   . Morbid obesity ([place])     Past Surgical History:  Procedure Laterality Date  . ABDOMINAL HYSTERECTOMY    . CESAREAN SECTION       Home Medications:  Prior to Admission medications   Medication Sig Start Date End Date Taking? Authorizing Provider  allopurinol (ZYLOPRIM) 300 MG tablet Take 300 mg by mouth daily.   Yes [provider]  amLODipine (NORVASC) 10 MG tablet Take 5 mg by mouth daily.  10/30/18  Yes [provider]  aspirin EC 81 MG EC tablet Take 1 tablet (81 mg total) by mouth daily. 10/25/18  Yes Lavina Hamman, MD  BD PEN NEEDLE NANO 2ND GEN 32G X 4 MM MISC 1 each by Other route as directed.  12/31/18  Yes [provider]  Blood Glucose Monitoring Suppl (ACCU-CHEK AVIVA PLUS) w/Device KIT 1 each by Other route as directed.  12/20/18  Yes [provider]  BREO ELLIPTA 100-25 MCG/INH AEPB Inhale 1 puff into the lungs daily.  11/07/18  Yes [provider]  Cholecalciferol (VITAMIN D3) 2000 units capsule Take 1 capsule by mouth daily.   Yes Bonsu,  Osei A, DO  Cyanocobalamin (VITAMIN B-12 PO) Take 1 tablet by mouth daily.   Yes [provider]  ferrous sulfate 325 (65 FE) MG EC tablet Take 1 tablet by mouth daily. 07/11/19  Yes [provider]  furosemide (LASIX) 40 MG tablet TAKE 2 TABLETS(80 MG) BY MOUTH TWICE DAILY Patient taking differently: Take 40 mg by mouth 2 (two) times daily.  04/25/19  Yes Revankar, Reita Cliche, MD  Garlic 1610 MG CAPS Take 1,000 mg by mouth daily.   Yes [provider]  glimepiride (AMARYL) 4 MG tablet Take 4 mg by mouth 2 (two) times daily.   Yes [provider]  ipratropium-albuterol (DUONEB) 0.5-2.5 (3) MG/3ML SOLN Inhale 3 mLs into the lungs every 4  (four) hours as needed (breathing.).    Yes Beauford, Patrick Jupiter, MD  LANTUS SOLOSTAR 100 UNIT/ML Solostar Pen Inject 15 Units into the skin daily.  01/21/19  Yes [provider]  losartan (COZAAR) 100 MG tablet Take 1 tablet by mouth daily.   Yes Bonsu, Osei A, DO  metFORMIN (GLUCOPHAGE) 500 MG tablet Take by mouth 2 (two) times daily with a meal.   Yes [provider]  metoprolol succinate (TOPROL-XL) 50 MG 24 hr tablet Take 50 mg by mouth daily.  02/06/19  Yes [provider]  montelukast (SINGULAIR) 10 MG tablet Take 10 mg by mouth at bedtime.   Yes [provider]  Multiple Vitamin (MULTIVITAMIN WITH MINERALS) TABS tablet Take 1 tablet by mouth daily.   Yes [provider]  nitroGLYCERIN (NITROSTAT) 0.4 MG SL tablet Place 1 tablet (0.4 mg total) under the tongue every 5 (five) minutes as needed for chest pain. 04/25/19  Yes Revankar, Reita Cliche, MD  Omega-3 Fatty Acids (FISH OIL) 1000 MG CAPS Take 1,000 mg by mouth 2 (two) times daily.    Yes [provider]  potassium chloride (KLOR-CON) 10 MEQ tablet Take 20 mEq by mouth daily.    Yes [provider]  rosuvastatin (CRESTOR) 40 MG tablet Take 40 mg by mouth daily.   Yes [provider]  Turmeric (QC TUMERIC COMPLEX) 500 MG CAPS Take 1 capsule by mouth daily.   Yes [provider]  VENTOLIN HFA 108 (90 Base) MCG/ACT inhaler Inhale 2 puffs into the lungs every 6 (six) hours as needed for wheezing or shortness of breath.  07/03/18  Yes [provider]  Calcium Carb-Cholecalciferol (CALCIUM-VITAMIN D3) 500-400 MG-UNIT TABS Take 1 tablet by mouth 2 (two) times daily. Patient not taking: Reported on 07/15/2019    [provider]  polyethylene glycol (MIRALAX / GLYCOLAX) 17 g packet Take 17 g by mouth daily. Patient not taking: Reported on 07/15/2019 10/25/18   Lavina Hamman, MD    Inpatient Medications: Scheduled Meds: . enoxaparin (LOVENOX) injection  40 mg  Subcutaneous Q24H  . furosemide  40 mg Intravenous Daily  . insulin aspart  0-15 Units Subcutaneous TID WC  . insulin aspart  0-5 Units Subcutaneous QHS  . sodium chloride flush  3 mL Intravenous Once   Continuous Infusions:  PRN Meds: acetaminophen **OR** acetaminophen, albuterol, morphine injection, nitroGLYCERIN, ondansetron **OR** ondansetron (ZOFRAN) IV  Allergies:    Allergies  Allergen Reactions  . Atorvastatin Swelling    Lip swelling  . Ciprocinonide [Fluocinolone] Other (See Comments)    Kidney failure  . Catapres [Clonidine Hcl] Rash  . Demadex [Torsemide] Rash    Social History:   Social History   Socioeconomic History  . Marital status:  Widowed    Spouse name: Not on file  . Number of children: Not on file  . Years of education: Not on file  . Highest education level: Not on file  Occupational History  . Not on file  Tobacco Use  . Smoking status: Never Smoker  . Smokeless tobacco: Never Used  Vaping Use  . Vaping Use: Never used  Substance and Sexual Activity  . Alcohol use: No  . Drug use: No  . Sexual activity: Never  Other Topics Concern  . Not on file  Social History Narrative  . Not on file   Social Determinants of Health   Financial Resource Strain:   . Difficulty of Paying Living Expenses:   Food Insecurity:   . Worried About Charity fundraiser in the Last Year:   . Arboriculturist in the Last Year:   Transportation Needs:   . Film/video editor (Medical):   Marland Kitchen Lack of Transportation (Non-Medical):   Physical Activity:   . Days of Exercise per Week:   . Minutes of Exercise per Session:   Stress:   . Feeling of Stress :   Social Connections:   . Frequency of Communication with Friends and Family:   . Frequency of Social Gatherings with Friends and Family:   . Attends Religious Services:   . Active Member of Clubs or Organizations:   . Attends Archivist Meetings:   Marland Kitchen Marital Status:   Intimate Partner Violence:    . Fear of Current or Ex-Partner:   . Emotionally Abused:   Marland Kitchen Physically Abused:   . Sexually Abused:     Family History:    Family History  Problem Relation Age of Onset  . Diabetes Mellitus I Mother   . CAD Mother   . Diabetes Mother   . Heart disease Father   . Cancer Father   . Breast cancer Sister   . Cancer Sister      ROS:  Please see the history of present illness.  General:no colds or fevers, no weight changes Skin:no rashes or ulcers HEENT:no blurred vision, no congestion CV:see HPI PUL:see HPI GI:no diarrhea constipation or melena, no indigestion GU:no hematuria, no dysuria MS:no joint pain, no claudication Neuro:no syncope, no lightheadedness Endo:+ diabetes not well controlled, no thyroid disease  All other ROS reviewed and negative.     Physical Exam/Data:   Vitals:   07/15/19 0830 07/15/19 1020 07/15/19 1057 07/15/19 1200  BP: (!) 145/69  (!) 97/59 (!) 157/73  Pulse: 66 62  67  Resp: (!) _0 (!) 23  Temp:      TempSrc:      SpO2: 100% 100%  100%  Weight:      Height:       No intake or output data in the 24 hours ending 07/15/19 1423 Last 3 Weights 07/14/2019 02/13/2019 12/24/2018  Weight (lbs) 288 lb 280 lb 12.8 oz 289 lb 6.4 oz  Weight (kg) 130.636 kg 127.37 kg 131.271 kg     Body mass index is 51.02 kg/m.  General:  Well nourished, well developed, in no acute distress sitting up but SOB in bed HEENT: normal Lymph: no adenopathy Neck: no JVD sitting up Endocrine:  No thryomegaly Vascular: No carotid bruits; pedal pulses 1+ bilaterally  Cardiac:  normal S1, S2; RRR; 3-4 systolic murmur no gallup or rub Lungs: minimal breath sounds to auscultation bilaterally, no wheezing, rhonchi or rales tight Abd: obese,soft, nontender, no hepatomegaly  Ext: 2-3+ lower ext edema to knees bil.  Musculoskeletal:  No deformities, BUE and BLE strength normal and equal Skin: warm and dry  Neuro:  Alert and orieneted X 3 MAE follows commands, no focal  abnormalities noted Psych:  Normal affect     Relevant CV Studies: Echo 10/2018 IMPRESSIONS    1. Left ventricular ejection fraction, by visual estimation, is 60 to  65%. The left ventricle has normal function. Normal left ventricular size.  There is severely increased left ventricular hypertrophy.  2. Chordal SAM with small LVOT gradient with severe LVH findings may be  consistent with HOCM.  3. Global right ventricle has normal systolic function.The right  ventricular size is normal. No increase in right ventricular wall  thickness.  4. Left atrial size was moderately dilated.  5. Right atrial size was normal.  6. Moderate mitral annular calcification.  7. The mitral valve is normal in structure. Trace mitral valve  regurgitation. No evidence of mitral stenosis.  8. The tricuspid valve is normal in structure. Tricuspid valve  regurgitation is mild.  9. The aortic valve was not well visualized Aortic valve regurgitation  was not visualized by color flow Doppler. Mild aortic valve stenosis.  10. The pulmonic valve was normal in structure. Pulmonic valve  regurgitation is mild by color flow Doppler.  11. Moderately elevated pulmonary artery systolic pressure.  12. The inferior vena cava is normal in size with greater than 50%  respiratory variability, suggesting right atrial pressure of 3 mmHg.   FINDINGS  Left Ventricle: Left ventricular ejection fraction, by visual estimation,  is 60 to 65%. The left ventricle has normal function. There is severely  increased left ventricular hypertrophy. Normal left ventricular size.  Chordal SAM with small LVOT gradient  with severe LVH findings may be consistent with HOCM.   Right Ventricle: The right ventricular size is normal. No increase in  right ventricular wall thickness. Global RV systolic function is has  normal systolic function. The tricuspid regurgitant velocity is 3.41 m/s,  and with an assumed right atrial  pressure  of 8 mmHg, the estimated right ventricular systolic pressure is  moderately elevated at 54.5 mmHg.   Left Atrium: Left atrial size was moderately dilated.   Right Atrium: Right atrial size was normal in size   Pericardium: There is no evidence of pericardial effusion.   Mitral Valve: The mitral valve is normal in structure. There is mild  thickening of the mitral valve leaflet(s). There is mild calcification of  the mitral valve leaflet(s). Moderate mitral annular calcification. No  evidence of mitral valve stenosis by  observation. Trace mitral valve regurgitation.   Tricuspid Valve: The tricuspid valve is normal in structure. Tricuspid  valve regurgitation is mild by color flow Doppler.   Aortic Valve: The aortic valve was not well visualized. Aortic valve  regurgitation was not visualized by color flow Doppler. Mild aortic  stenosis is present. Aortic valve mean gradient measures 12.0 mmHg. Aortic  valve peak gradient measures 21.2 mmHg.  Aortic valve area, by VTI measures 2.01 cm.   Pulmonic Valve: The pulmonic valve was normal in structure. Pulmonic valve  regurgitation is mild by color flow Doppler.   Aorta: The aortic root, ascending aorta and aortic arch are all  structurally normal, with no evidence of dilitation or obstruction.   Venous: The inferior vena cava is normal in size with greater than 50%  respiratory variability, suggesting right atrial pressure of 3 mmHg.   IAS/Shunts: No atrial  level shunt detected by color flow Doppler. No  ventricular septal defect is seen or detected. There is no evidence of an  atrial septal defect.   Laboratory Data:  High Sensitivity Troponin:   Recent Labs  Lab 07/14/19 1506 07/14/19 1902 07/14/19 2224 07/15/19 0711  TROPONINIHS 35* 48* 47* 40*     Chemistry Recent Labs  Lab 07/14/19 1506  NA 141  K 3.7  CL 90*  CO2 36*  GLUCOSE 206*  BUN 37*  CREATININE 0.96  CALCIUM 10.0  GFRNONAA 60*  GFRAA  >60  ANIONGAP 15    No results for input(s): PROT, ALBUMIN, AST, ALT, ALKPHOS, BILITOT in the last 168 hours. Hematology Recent Labs  Lab 07/14/19 1506  WBC 6.1  RBC 4.04  HGB 10.8*  HCT 36.6  MCV 90.6  MCH 26.7  MCHC 29.5*  RDW 16.3*  PLT 168   BNP Recent Labs  Lab 07/14/19 1506  BNP 329.2*    DDimer No results for input(s): DDIMER in the last 168 hours.   Radiology/Studies:  DG Chest 2 View  Result Date: 07/14/2019 CLINICAL DATA:  Chest pain EXAM: CHEST - 2 VIEW COMPARISON:  October 20, 2018 and chest CT July 21, 2016 FINDINGS: The lungs are clear. Heart is mildly enlarged. There is prominence of central pulmonary arteries with fairly rapid peripheral tapering. The interstitium is mildly prominent with suspected mild interstitial edema. No adenopathy appreciable. There is degenerative change in the thoracic spine. IMPRESSION: Mild cardiomegaly with pulmonary arterial hypertension. Slight interstitial edema. No consolidation. Electronically Signed   By: Lowella Grip III M.D.   On: 07/14/2019 14:32       HEAR Score (for undifferentiated chest pain):     5  New York Heart Association (NYHA) Functional Class NYHA Class III  Assessment and Plan:   1. Chest Lt arm pain with EKG with significant LVH and troponins mildly elevated but flat.   No recent ischemic work up, could be due to HOCM alone though would be to Lt and Rt cardiac cath. Dr. Angelena Form to see. 2. Acute SOB with HOCM and COPD/Asthma on home 02.  Has not rec'd lasix or her BP meds - breath sounds diminished, + lower ext edema. Check echo  Would IV diurese with lasix for one or 2 doses, do not want to dry out.  3. AS with loud murmur but in Oct 2020 was mild, Aortic valve mean gradient measures 12.0 mmHg. Aortic valve peak gradient measures 21.2 mmHg. Aortic valve area, by VTI measures 2.01 cm 4. 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 HLD on statin Crestor.  6. DM-2 per IM on insulin and metformin  7. COPD/asthma  per IM        For questions or updates, please contact [place] Please consult [email or web address] for contact info under    Signed, Cecilie Kicks, NP  07/15/2019 2:23 PM  I have personally seen and examined this patient. I agree with the assessment and plan as outlined above.  69 yo female with history of obesity, DM, HLD, HTN, chronic hypoxic respiratory failure secondary to COPD and severe LVH/HOCM with preserved LV systolic function by echo in October 2020 admitted through the ED with c/o dyspnea, LE edema and chest pain. Troponin with subtle elevation and flat trend. I personally reviewed the EKG and it shows sinus with LVH, no clear ischemic changes.  She describes resting chest pain radiating to the left shoulder and dyspnea for several days with worsened LE edema. The pain  is quick sharp pains.   My exam:  General: Obese female in NAD  HEENT: OP clear, mucus membranes moist  SKIN: warm, dry. No rashes. Neuro: No focal deficits  Musculoskeletal: Muscle strength 5/5 all ext  Psychiatric: Mood and affect normal  Neck: unable to assess  JVD Lungs:Clear bilaterally, no wheezes, rhonci, crackles Cardiovascular: Regular rate and rhythm. No murmurs, gallops or rubs. Abdomen:Soft. Bowel sounds present. Non-tender.  Extremities: 2-3+ bilateral lower extremity edema to knees  Plan: Acute on chronic diastolic CHF: She is volume overloaded. Will begin diuresis with IV Lasix Elevated troponin/Chest pain: Will not start IV heparin. This does not seem to represent ACS. Will consider right and left heart cath prior to discharge.  HOCM: Repeat echo to assess LV function, exclude valve issues  Lauree Chandler 07/15/2019 3:37 PM

## 2019-07-15 NOTE — ED Notes (Signed)
Pt toileted.  

## 2019-07-15 NOTE — Care Management Obs Status (Signed)
MEDICARE OBSERVATION STATUS NOTIFICATION   Patient Details  Name: Morgan Fischer MRN: [id number] Date of Birth: 1950-04-28   Medicare Observation Status Notification Given:  Yes    Leone Haven, RN 07/15/2019, 4:52 PM

## 2019-07-15 NOTE — Progress Notes (Signed)
Placed patient back on BIPAP 22/14 Set rate 8 FIO2 30%

## 2019-07-15 NOTE — Progress Notes (Signed)
Placed patient of BiPAP on her home settings of IPAP 22 and EPAP 14 with 30% FIO2.  Patient tolerating well and confirmed she is comfortable on these settings.  RT will continue to monitor.

## 2019-07-15 NOTE — H&P (Signed)
History and Physical    Morgan Fischer ESP:[id number] DOB: [birth year] DOA: 07/14/2019  PCP: Benito Mccreedy, MD  Patient coming from: [facility] I have personally briefly reviewed patient's old medical records in [place]  Chief Complaint: Chest pain  HPI: Morgan Fischer is a 69 y.o. female with medical history significant of HOCM, chronic hypoxemic respiratory failure secondary to underlying COPD-on 3 L of oxygen via nasal cannulae at home and BiPAP at nighttime, hypertension, diabetes, hyperlipidemia, morbid obesity presents to emergency department with left-sided chest pain since 3 to 4 days.  Patient tells me that her chest pain started 3 to 4 days, left-sided, she took Tums which helped.  Next morning she developed shortness of breath especially with exertion and intermittent and progressive left-sided chest pain radiates to left shoulder, denies worsening leg swelling, orthopnea, PND, cough, congestion, wheezing, palpitation, fever or chills.  She tells me that she is scheduled for Doppler ultrasound of bilateral lower extremities to rule out DVT tomorrow AM.  She uses 3 L of oxygen and BiPAP at nighttime.  Denies increased requirement of oxygen at home.  No history of headache, blurry vision, abdominal pain, nausea, vomiting, diarrhea, urinary symptoms.  She lives with her daughter at home.  No history of smoking, alcohol, illicit drug use.  She uses cane for ambulation.  Upon my evaluation in ED: Patient sitting comfortably on the chair.  On 3 L of oxygen via nasal cannula, communicating well, reports that her chest pain has resolved however reports shortness of breath with exertion which is chronic for her.  ED Course: Upon arrival to ED patient's heart rate was noted to be in 50s-60s, blood pressure labile, respiratory rate in 20s, afebrile with no leukocytosis.  BNP: 329, troponin 35--> 48--> 47--> 40, EKG: Shows sinus rhythm with LVH, no ST elevation or depression noted.  CMP shows  CKD stage II, COVID-19 negative, chest x-ray shows mild cardiomegaly with LVH and slight interstitial edema.  Patient received aspirin 325.  EDP talked to cardiologist who recommended transfer patient to [facility] for further evaluation and management of her chest pain.  Cardiology recommend against start on IV heparin.  Review of Systems: As per HPI otherwise negative.    Past Medical History:  Diagnosis Date  . Arthritis   . Asthma   . CHF (congestive heart failure) ([place])   . COPD (chronic obstructive pulmonary disease) ([place])   . Diabetes mellitus without complication ([place])   . High cholesterol   . Hypertension   . Morbid obesity ([place])     Past Surgical History:  Procedure Laterality Date  . ABDOMINAL HYSTERECTOMY    . CESAREAN SECTION       reports that she has never smoked. She has never used smokeless tobacco. She reports that she does not drink alcohol and does not use drugs.  Allergies  Allergen Reactions  . Atorvastatin Swelling    Lip swelling  . Ciprocinonide [Fluocinolone] Other (See Comments)    Kidney failure  . Catapres [Clonidine Hcl] Rash  . Demadex [Torsemide] Rash    Family History  Problem Relation Age of Onset  . Diabetes Mellitus I Mother   . CAD Mother   . Diabetes Mother   . Heart disease Father   . Cancer Father   . Breast cancer Sister   . Cancer Sister     Prior to Admission medications   Medication Sig Start Date End Date Taking? Authorizing Provider  allopurinol (ZYLOPRIM) 300 MG tablet Take 300 mg  by mouth daily.    [provider]  amLODipine (NORVASC) 10 MG tablet Take 5 mg by mouth daily.  10/30/18   [provider]  aspirin EC 81 MG EC tablet Take 1 tablet (81 mg total) by mouth daily. 10/25/18   Lavina Hamman, MD  BD PEN NEEDLE NANO 2ND GEN 32G X 4 MM MISC 1 each by Other route as directed.  12/31/18   [provider]  Blood Glucose Monitoring Suppl (ACCU-CHEK AVIVA PLUS) w/Device KIT 1 each by Other  route as directed.  12/20/18   [provider]  [place] Patter 100-25 MCG/INH AEPB  11/07/18   [provider]  Calcium Carb-Cholecalciferol (CALCIUM-VITAMIN D3) 500-400 MG-UNIT TABS Take 1 tablet by mouth 2 (two) times daily.    [provider]  Cholecalciferol (VITAMIN D3) 2000 units capsule Take 1 capsule by mouth daily.    Bonsu, Osei A, DO  Cyanocobalamin (VITAMIN B-12 PO) Take 1 tablet by mouth daily.    [provider]  furosemide (LASIX) 40 MG tablet TAKE 2 TABLETS(80 MG) BY MOUTH TWICE DAILY Patient taking differently: Take 40 mg by mouth 2 (two) times daily.  04/25/19   Revankar, Reita Cliche, MD  Garlic 9381 MG CAPS Take 1,000 mg by mouth daily.    [provider]  glimepiride (AMARYL) 4 MG tablet Take 4 mg by mouth 2 (two) times daily.    [provider]  ipratropium-albuterol (DUONEB) 0.5-2.5 (3) MG/3ML SOLN Use 1 vial in nebulizer every 4 hours as needed    Gwenevere Ghazi, MD  LANTUS SOLOSTAR 100 UNIT/ML Solostar Pen  01/21/19   [provider]  losartan (COZAAR) 100 MG tablet Take 1 tablet by mouth daily.    Bonsu, Osei A, DO  metFORMIN (GLUCOPHAGE) 500 MG tablet Take by mouth 2 (two) times daily with a meal.    [provider]  metoprolol succinate (TOPROL-XL) 50 MG 24 hr tablet  02/06/19   [provider]  montelukast (SINGULAIR) 10 MG tablet Take 10 mg by mouth at bedtime.    [provider]  Multiple Vitamin (MULTIVITAMIN WITH MINERALS) TABS tablet Take 1 tablet by mouth daily.    [provider]  nitroGLYCERIN (NITROSTAT) 0.4 MG SL tablet Place 1 tablet (0.4 mg total) under the tongue every 5 (five) minutes as needed for chest pain. 04/25/19   Revankar, Reita Cliche, MD  Omega-3 Fatty Acids (FISH OIL) 1000 MG CAPS Take 1,000 mg by mouth 2 (two) times daily.     [provider]  polyethylene glycol (MIRALAX / GLYCOLAX) 17 g packet Take 17 g by mouth daily. 10/25/18   Lavina Hamman, MD   potassium chloride (KLOR-CON) 10 MEQ tablet Take 20 mEq by mouth 2 (two) times daily. TAKE 2 TABLETS BY MOUTH TWICE DAILY.    [provider]  rosuvastatin (CRESTOR) 40 MG tablet Take 40 mg by mouth daily.    [provider]  Turmeric (QC TUMERIC COMPLEX) 500 MG CAPS Take 1 capsule by mouth daily.    [provider]  VENTOLIN HFA 108 (90 Base) MCG/ACT inhaler Inhale 2 puffs into the lungs every 6 (six) hours as needed for wheezing or shortness of breath.  07/03/18   [provider]  Vitamins/Minerals TABS Take by mouth.    [provider]    Physical Exam: Vitals:   07/15/19 0748 07/15/19 0830 07/15/19 1020 07/15/19 1057  BP:  (!) 145/69  (!) 97/59  Pulse: 68 66  62   Resp: (!) 22 (!) '28 18 16  ' Temp:      TempSrc:      SpO2: 100% 100% 100%   Weight:      Height:        Constitutional: NAD, calm, comfortable, on 3 L of oxygen, morbid obesity Eyes: PERRL, lids and conjunctivae normal ENMT: Mucous membranes are moist. Posterior pharynx clear of any exudate or lesions.Normal dentition.  Neck: normal, supple, no masses, no thyromegaly Respiratory: Tachypneic clear to auscultation bilaterally, no wheezing, no crackles. Normal respiratory effort. No accessory muscle use.  Cardiovascular: Regular rate and rhythm, systolic murmur/ rubs / gallops.  Bilateral nonpitting edema positive. 2+ pedal pulses. No carotid bruits.  Abdomen: Obese abdomen, no tenderness, no masses palpated. No hepatosplenomegaly. Bowel sounds positive.  Musculoskeletal: no clubbing / cyanosis. No joint deformity upper and lower extremities. Good ROM, no contractures. Normal muscle tone.  Skin: no rashes, lesions, ulcers. No induration Neurologic: CN 2-12 grossly intact. Sensation intact, DTR normal. Strength 5/5 in all 4.  Psychiatric: Normal judgment and insight. Alert and oriented x 3. Normal mood.    Labs on Admission: I have personally reviewed following labs and imaging  studies  CBC: Recent Labs  Lab 07/14/19 1506  WBC 6.1  HGB 10.8*  HCT 36.6  MCV 90.6  PLT 300   Basic Metabolic Panel: Recent Labs  Lab 07/14/19 1506  NA 141  K 3.7  CL 90*  CO2 36*  GLUCOSE 206*  BUN 37*  CREATININE 0.96  CALCIUM 10.0   GFR: Estimated Creatinine Clearance: 72.1 mL/min (by C-G formula based on SCr of 0.96 mg/dL). Liver Function Tests: No results for input(s): AST, ALT, ALKPHOS, BILITOT, PROT, ALBUMIN in the last 168 hours. No results for input(s): LIPASE, AMYLASE in the last 168 hours. No results for input(s): AMMONIA in the last 168 hours. Coagulation Profile: No results for input(s): INR, PROTIME in the last 168 hours. Cardiac Enzymes: No results for input(s): CKTOTAL, CKMB, CKMBINDEX, TROPONINI in the last 168 hours. BNP (last 3 results) No results for input(s): PROBNP in the last 8760 hours. HbA1C: No results for input(s): HGBA1C in the last 72 hours. CBG: Recent Labs  Lab 07/15/19 0324  GLUCAP 136*   Lipid Profile: No results for input(s): CHOL, HDL, LDLCALC, TRIG, CHOLHDL, LDLDIRECT in the last 72 hours. Thyroid Function Tests: No results for input(s): TSH, T4TOTAL, FREET4, T3FREE, THYROIDAB in the last 72 hours. Anemia Panel: No results for input(s): VITAMINB12, FOLATE, FERRITIN, TIBC, IRON, RETICCTPCT in the last 72 hours. Urine analysis: No results found for: COLORURINE, APPEARANCEUR, [place], [place], [place], [place], [place], [place], [place], [place], [place], LEUKOCYTESUR  Radiological Exams on Admission: DG Chest 2 View  Result Date: 07/14/2019 CLINICAL DATA:  Chest pain EXAM: CHEST - 2 VIEW COMPARISON:  October 20, 2018 and chest CT July 21, 2016 FINDINGS: The lungs are clear. Heart is mildly enlarged. There is prominence of central pulmonary arteries with fairly rapid peripheral tapering. The interstitium is mildly prominent with suspected mild interstitial edema. No adenopathy appreciable. There is degenerative  change in the thoracic spine. IMPRESSION: Mild cardiomegaly with pulmonary arterial hypertension. Slight interstitial edema. No consolidation. Electronically Signed   By: Lowella Grip III M.D.   On: 07/14/2019 14:32    EKG: Independently reviewed.  Sinus rhythm, LVH.  Assessment/Plan Principal Problem:   Chest pain Active Problems:   Elevated troponin   Morbid obesity (HCC)   Hypertension   Diabetes mellitus without complication (HCC)   COPD (  chronic obstructive pulmonary disease) (HCC)   Chronic hypoxemic respiratory failure (HCC)   CKD (chronic kidney disease)   Chest pain with exertional shortness of breath: -Patient presented with left-sided chest pain with exertional shortness of breath.  Has history of HOCM.  Troponin 35--> 48--> 47--> 40.Marland Kitchen  Reviewed EKG.  Patient denies ACS symptoms currently. -Admit patient on the floor.  On telemetry. -Nitro/morphine as needed for pain control. -Continue aspirin, statin, metoprolol and losartan -Check D-dimer if elevated will get CTA of chest to rule out PE. -Consult cardiology  Acute on chronic diastolic CHF/HOCM: -Patient presented with exertional shortness of breath.  Has chronic leg swelling and orthopnea and PND. -BNP: 329, chest x-ray shows mild cardiomegaly with PAH and slight interstitial edema -Reviewed echo from 10/21/2018 which showed preserved ejection fraction of 60 to 65% and HOCM-Continue Lasix.  Strict INO's and daily weight. -Consult cardiology.  Check electrolytes.  Hypertension: Blood pressure is labile -Continue amlodipine, metoprolol and losartan and monitor blood pressure closely  Hyperlipidemia: Continue statin  Diabetes mellitus: Hold Metformin and Amaryl for now.  Continue Lantus.  And start patient on sliding scale insulin.  Monitor blood sugar closely.  Check A1c  Chronic hypoxemic respiratory failure secondary to underlying COPD: -No wheezing noted on exam.  No increased requirement of oxygen. -On  continuous pulse ox.  Continue 3 L of oxygen via nasal cannula.  BiPAP at bedtime -Reviewed chest x-ray.  She is afebrile with no leukocytosis. -Continue home inhalers  Gout: Continue allopurinol  Morbid obesity with BMI of 51 -Lifestyle modification, exercise and weight loss recommended  DVT prophylaxis: Lovenox/SCD  code Status: Full code-confirmed with the patient Family Communication: None present at bedside.  Plan of care discussed with patient in length and he verbalized understanding and agreed with it. Disposition Plan: Likely home in 2 days Consults called: Cardiology Admission status: Inpatient   Mckinley Jewel MD [facility]  If 7PM-7AM, please contact night-coverage [email or web address] Password [facility]  07/15/2019, 12:31 PM

## 2019-07-15 NOTE — Progress Notes (Signed)
Patient removed from BIPAP and placed back on a 2 liter nasal cannula.

## 2019-07-15 NOTE — Progress Notes (Signed)
Pt had 15 beat run of VTach. Pt stable and asymptomatic. MD notified. Will continue to monitor.

## 2019-07-15 NOTE — ED Notes (Signed)
Pt transferred to [facility] ED awaiting bed assignment

## 2019-07-15 NOTE — ED Notes (Signed)
Pt awake, removed from bipap, utilizing 3L oxygen via [place], provided breakfast.  Continues to await inpatient bed assignment.

## 2019-07-16 ENCOUNTER — Observation Stay (HOSPITAL_COMMUNITY): Payer: Medicare HMO

## 2019-07-16 DIAGNOSIS — E119 Type 2 diabetes mellitus without complications: Secondary | ICD-10-CM

## 2019-07-16 DIAGNOSIS — I350 Nonrheumatic aortic (valve) stenosis: Secondary | ICD-10-CM

## 2019-07-16 LAB — COMPREHENSIVE METABOLIC PANEL
ALT: 17 U/L (ref 0–44)
AST: 18 U/L (ref 15–41)
Albumin: 3.2 g/dL — ABNORMAL LOW (ref 3.5–5.0)
Alkaline Phosphatase: 49 U/L (ref 38–126)
Anion gap: 12 (ref 5–15)
BUN: 30 mg/dL — ABNORMAL HIGH (ref 8–23)
CO2: 34 mmol/L — ABNORMAL HIGH (ref 22–32)
Calcium: 9.3 mg/dL (ref 8.9–10.3)
Chloride: 96 mmol/L — ABNORMAL LOW (ref 98–111)
Creatinine, Ser: 0.86 mg/dL (ref 0.44–1.00)
GFR calc Af Amer: >60 mL/min (ref >60)
GFR calc non Af Amer: >60 mL/min (ref >60)
Glucose, Bld: 131 mg/dL — ABNORMAL HIGH (ref 70–99)
Potassium: 3.8 mmol/L (ref 3.5–5.1)
Sodium: 142 mmol/L (ref 135–145)
Total Bilirubin: 0.6 mg/dL (ref 0.3–1.2)
Total Protein: 6.1 g/dL — ABNORMAL LOW (ref 6.5–8.1)

## 2019-07-16 LAB — TROPONIN I (HIGH SENSITIVITY)
Troponin I (High Sensitivity): 36 ng/L — ABNORMAL HIGH (ref <18)
Troponin I (High Sensitivity): 31 ng/L — ABNORMAL HIGH (ref <18)
Troponin I (High Sensitivity): 29 ng/L — ABNORMAL HIGH (ref <18)
Troponin I (High Sensitivity): 33 ng/L — ABNORMAL HIGH (ref <18)

## 2019-07-16 LAB — CBC
HCT: 30.5 % — ABNORMAL LOW (ref 36.0–46.0)
Hemoglobin: 8.9 g/dL — ABNORMAL LOW (ref 12.0–15.0)
MCH: 26.3 pg (ref 26.0–34.0)
MCHC: 29.2 g/dL — ABNORMAL LOW (ref 30.0–36.0)
MCV: 90.2 fL (ref 80.0–100.0)
Platelets: 126 10*3/uL — ABNORMAL LOW (ref 150–400)
RBC: 3.38 MIL/uL — ABNORMAL LOW (ref 3.87–5.11)
RDW: 16.5 % — ABNORMAL HIGH (ref 11.5–15.5)
WBC: 5 10*3/uL (ref 4.0–10.5)
nRBC: 0 % (ref 0.0–0.2)

## 2019-07-16 LAB — GLUCOSE, CAPILLARY
Glucose-Capillary: 96 mg/dL (ref 70–99)
Glucose-Capillary: 105 mg/dL — ABNORMAL HIGH (ref 70–99)
Glucose-Capillary: 210 mg/dL — ABNORMAL HIGH (ref 70–99)
Glucose-Capillary: 220 mg/dL — ABNORMAL HIGH (ref 70–99)
Glucose-Capillary: 111 mg/dL — ABNORMAL HIGH (ref 70–99)

## 2019-07-16 MED ORDER — INSULIN ASPART 100 UNIT/ML ~~LOC~~ SOLN
0.0000 [IU] | Freq: Every day | SUBCUTANEOUS | Status: DC
  Administered 2019-07-20: 2 [IU] via SUBCUTANEOUS

## 2019-07-16 MED ORDER — POTASSIUM CHLORIDE CRYS ER 20 MEQ PO TBCR: 40.0000 meq | EXTENDED_RELEASE_TABLET | Freq: Once | ORAL | Status: DC

## 2019-07-16 MED ORDER — ENOXAPARIN SODIUM 60 MG/0.6ML ~~LOC~~ SOLN
60.0000 mg | SUBCUTANEOUS | Status: DC
  Administered 2019-07-17 – 2019-07-20 (×4): 60 mg via SUBCUTANEOUS
  Filled 2019-07-16 (×4): qty 0.6

## 2019-07-16 MED ORDER — POTASSIUM CHLORIDE CRYS ER 20 MEQ PO TBCR
40.0000 meq | EXTENDED_RELEASE_TABLET | Freq: Two times a day (BID) | ORAL | Status: AC
  Administered 2019-07-16 – 2019-07-17 (×4): 40 meq via ORAL
  Filled 2019-07-16 (×4): qty 2

## 2019-07-16 MED ORDER — INSULIN GLARGINE 100 UNIT/ML ~~LOC~~ SOLN
10.0000 [IU] | Freq: Two times a day (BID) | SUBCUTANEOUS | Status: DC
  Administered 2019-07-16 – 2019-07-21 (×11): 10 [IU] via SUBCUTANEOUS
  Filled 2019-07-16 (×11): qty 0.1

## 2019-07-16 MED ORDER — INSULIN ASPART 100 UNIT/ML ~~LOC~~ SOLN
4.0000 [IU] | Freq: Three times a day (TID) | SUBCUTANEOUS | Status: DC
  Administered 2019-07-16 – 2019-07-21 (×15): 4 [IU] via SUBCUTANEOUS

## 2019-07-16 MED ORDER — INSULIN ASPART 100 UNIT/ML ~~LOC~~ SOLN
0.0000 [IU] | Freq: Three times a day (TID) | SUBCUTANEOUS | Status: DC
  Administered 2019-07-16 – 2019-07-17 (×2): 5 [IU] via SUBCUTANEOUS
  Administered 2019-07-17 – 2019-07-18 (×2): 3 [IU] via SUBCUTANEOUS
  Administered 2019-07-18: 2 [IU] via SUBCUTANEOUS
  Administered 2019-07-19 – 2019-07-20 (×3): 3 [IU] via SUBCUTANEOUS
  Administered 2019-07-20: 2 [IU] via SUBCUTANEOUS
  Administered 2019-07-20 – 2019-07-21 (×3): 3 [IU] via SUBCUTANEOUS

## 2019-07-16 NOTE — Plan of Care (Signed)

## 2019-07-16 NOTE — Progress Notes (Addendum)
Progress Note  Patient Name: Morgan Fischer Date of Encounter: 07/16/2019  [facility] Cardiologist: Garwin Brothers, MD   Subjective   Got IV access this morning. No chest pain. Breathing improving.   Inpatient Medications    Scheduled Meds: . allopurinol  300 mg Oral Daily  . amLODipine  5 mg Oral Daily  . aspirin EC  81 mg Oral Daily  . enoxaparin (LOVENOX) injection  40 mg Subcutaneous Q24H  . ferrous sulfate  325 mg Oral Daily  . fluticasone furoate-vilanterol  1 puff Inhalation Daily  . furosemide  40 mg Intravenous BID  . insulin aspart  0-15 Units Subcutaneous TID WC  . insulin aspart  0-5 Units Subcutaneous QHS  . insulin glargine  15 Units Subcutaneous Daily  . losartan  100 mg Oral Daily  . metoprolol succinate  50 mg Oral Daily  . montelukast  10 mg Oral QHS  . potassium chloride  40 mEq Oral Once  . rosuvastatin  40 mg Oral Daily  . sodium chloride flush  3 mL Intravenous Once   Continuous Infusions:  PRN Meds: acetaminophen **OR** acetaminophen, albuterol, ipratropium-albuterol, morphine injection, nitroGLYCERIN, ondansetron **OR** ondansetron (ZOFRAN) IV   Vital Signs    Vitals:   07/16/19 0019 07/16/19 0438 07/16/19 0804 07/16/19 0843  BP: 122/71 (!) 139/52  136/63  Pulse: (!) 59 62  65  Resp: 20 20  18   Temp: 97.6 F (36.4 C) 98.6 F (37 C)  98.4 F (36.9 C)  TempSrc: Oral   Oral  SpO2: 100% 100% 98% 100%  Weight: 133 kg     Height:        Intake/Output Summary (Last 24 hours) at 07/16/2019 1129 Last data filed at 07/16/2019 0955 Gross per 24 hour  Intake 720 ml  Output 1350 ml  Net -630 ml   Last 3 Weights 07/16/2019 07/15/2019 07/14/2019  Weight (lbs) 293 lb 4.8 oz 292 lb 1.8 oz 288 lb  Weight (kg) 133.04 kg 132.5 kg 130.636 kg      Telemetry    NSR with NSVT- Personally Reviewed  ECG    No new tracing   Physical Exam   GEN: Obese female in no acute distress.   Neck: No JVD Cardiac: RRR, + murmurs, rubs, or gallops.   Respiratory: Diminished breath sound  GI: Soft, nontender, non-distended  MS: 1 + edema; No deformity. Neuro:  Nonfocal  Psych: Normal affect   Labs    High Sensitivity Troponin:   Recent Labs  Lab 07/15/19 0711 07/15/19 1510 07/15/19 2152 07/16/19 0340 07/16/19 1024  TROPONINIHS 40* 38* 33* 36* 31*      Chemistry Recent Labs  Lab 07/14/19 1506 07/15/19 1510 07/16/19 0340  NA 141 143 142  K 3.7 3.7 3.8  CL 90* 94* 96*  CO2 36* 36* 34*  GLUCOSE 206* 118* 131*  BUN 37* 30* 30*  CREATININE 0.96 0.81 0.86  CALCIUM 10.0 10.1 9.3  PROT  --  7.4 6.1*  ALBUMIN  --  4.1 3.2*  AST  --  21 18  ALT  --  20 17  ALKPHOS  --  64 49  BILITOT  --  0.6 0.6  GFRNONAA 60* >60 >60  GFRAA >60 >60 >60  ANIONGAP 15 13 12      Hematology Recent Labs  Lab 07/14/19 1506 07/15/19 1510 07/16/19 0340  WBC 6.1 5.7 5.0  RBC 4.04 4.17 3.38*  HGB 10.8* 11.0* 8.9*  HCT 36.6 37.5 30.5*  MCV 90.6  89.9 90.2  MCH 26.7 26.4 26.3  MCHC 29.5* 29.3* 29.2*  RDW 16.3* 16.4* 16.5*  PLT 168 161 126*    BNP Recent Labs  Lab 07/14/19 1506  BNP 329.2*     DDimer  Recent Labs  Lab 07/15/19 1510  DDIMER 0.43     Radiology    DG Chest 2 View  Result Date: 07/14/2019 CLINICAL DATA:  Chest pain EXAM: CHEST - 2 VIEW COMPARISON:  October 20, 2018 and chest CT July 21, 2016 FINDINGS: The lungs are clear. Heart is mildly enlarged. There is prominence of central pulmonary arteries with fairly rapid peripheral tapering. The interstitium is mildly prominent with suspected mild interstitial edema. No adenopathy appreciable. There is degenerative change in the thoracic spine. IMPRESSION: Mild cardiomegaly with pulmonary arterial hypertension. Slight interstitial edema. No consolidation. Electronically Signed   By: Bretta Bang III M.D.   On: 07/14/2019 14:32    Cardiac Studies   Pending echo  Patient Profile     69 y.o. female with a hx of hypertrophic cardiomyopathy, HTN, 02 dependent,  COPD, Asthma, HLD, DM-2  seen for CP and SOB.   Assessment & Plan    1. Acute on chronic diastolic CHF: - Unable to give lasix yesterday due to access issue. Continue IV lasix 40mg  BID. Strict I & O. Watch renal function.   2. Elevated troponin/Chest pain:  - Will not start IV heparin. This does not seem to represent ACS. - Consider right and left heart cath prior to discharge.  - Continue ASA, Statin and BB  3. HOCM:  - Pending repeat Echo  4. HTN: - BP stable on current medications   5. NSVT - she did felt palpitations - Continue BB - Mg is 2.4 - K 3.8 (keep above 4 with diuresis)>> will supplement   For questions or updates, please contact [facility] Please consult [email or web address] for contact info under        Signed, , PA  07/16/2019, 11:29 AM    I have personally seen and examined this patient. I agree with the assessment and plan as outlined above. Agree with IV Lasix now that she has IV access. Echo today. No evidence of ACS. Will make further plans for possible ischemic workup based on symptoms following diuresis  07/18/2019 07/16/2019 12:38 PM

## 2019-07-16 NOTE — Progress Notes (Signed)
  Echocardiogram 2D Echocardiogram has been performed.  Morgan Fischer G Morgan Fischer 07/16/2019, 3:07 PM

## 2019-07-16 NOTE — Progress Notes (Signed)
Patient currently off CPAP and on 3l [place].  Patient tolerating well.

## 2019-07-16 NOTE — Progress Notes (Signed)
PHARMACY NOTE -  Lovenox  DOSE ADJUSTMENT    Patient has been initiated on Lovenox 40 mg sq daily for DVT prophylaxis. Estimated CrCl 80 ml/min Will adjust dose to 0.5 mg/kg (round to nearest syringe size of 60 mg daily)  Reece Leader, Loura Back, BCPS, [facility] Clinical Pharmacist  07/16/2019 1:48 PM   [facility] pharmacy phone numbers are listed on [email or web address]

## 2019-07-16 NOTE — Progress Notes (Signed)
[facility] PROGRESS NOTE    Progress Note  Morgan Fischer  YYT:[id number] DOB: 1950/09/11 DOA: 07/14/2019 PCP: Jackie Plum, MD     Brief Narrative:   Morgan Fischer is an 69 y.o. female past medical history significant for [place], chronic hypoxic respiratory failure secondary to underlying COPD on 3 L obstructive sleep apnea on BiPAP at night, morbid obesity, diabetes mellitus type 2 comes into the ED for 3 days of chest pain.  Assessment/Plan:   Exertional chest pain with shortness of breath: Has a history of blood come troponins have been basically flat. D-dimer 0.43, cardiology was consulted who does not think there is likely related to ACS, more likely acute on chronic diastolic heart failure. Continue aspirin statins beta-blockers.  Acute on chronic diastolic heart failure: She was started on IV Lasix, diuresed about 600 cc. Is unable to receive Lasix yesterday due to IV access. Continue strict I's  and O's and daily weights.  Restrict her fluid intake. Monitor electrolytes and replete as needed, estimated dry weight is around 130 kg.  Essential hypertension: Continue amlodipine, metoprolol and losartan seems to be fairly controlled.  Hyperlipidemia: Continue statins.  Uncontrolled diabetes mellitus: Hold Metformin, continue Amaryl . Long-acting insulin change sliding scale to moderate with meal coverage.  Chronic respiratory failure with hypoxia due to underlying COPD: No wheezing on physical exam continue current home of oxygen. Remain afebrile with no leukocytosis.  Gout: Continue allopurinol.  DVT prophylaxis: lovenox Family Communication:non Status is: Observation  The patient will require care spanning > 2 midnights and should be moved to inpatient because: Hemodynamically unstable  Dispo: The patient is from: Home              Anticipated d/c is to: SNF              Anticipated d/c date is: 2 days              Patient currently is not  medically stable to d/c.        Code Status:     Code Status Orders  (From admission, onward)         Start     Ordered   07/15/19 1229  Full code  Continuous        07/15/19 1229        Code Status History    Date Active Date Inactive Code Status Order ID Comments User Context   10/21/2018 0934 10/24/2018 2056 Full Code [id number]  Briant Cedar, MD Inpatient   08/24/2015 0257 08/28/2015 1720 Full Code [id number]  Lorretta Harp, MD Inpatient   Advance Care Planning Activity        IV Access:    Peripheral IV   Procedures and diagnostic studies:   DG Chest 2 View  Result Date: 07/14/2019 CLINICAL DATA:  Chest pain EXAM: CHEST - 2 VIEW COMPARISON:  October 20, 2018 and chest CT July 21, 2016 FINDINGS: The lungs are clear. Heart is mildly enlarged. There is prominence of central pulmonary arteries with fairly rapid peripheral tapering. The interstitium is mildly prominent with suspected mild interstitial edema. No adenopathy appreciable. There is degenerative change in the thoracic spine. IMPRESSION: Mild cardiomegaly with pulmonary arterial hypertension. Slight interstitial edema. No consolidation. Electronically Signed   By: Bretta Bang III M.D.   On: 07/14/2019 14:32     Medical Consultants:    None.  Anti-Infectives:   none  Subjective:    Milah Recht relates her breathing is about the same  Objective:    Vitals:   07/16/19 0438 07/16/19 0804 07/16/19 0843 07/16/19 1206  BP: (!) 139/52  136/63 135/61  Pulse: 62  65 (!) 57  Resp: 20  18 18   Temp: 98.6 F (37 C)  98.4 F (36.9 C) 97.9 F (36.6 C)  TempSrc:   Oral Oral  SpO2: 100% 98% 100% 100%  Weight:      Height:       SpO2: 100 % O2 Flow Rate (L/min): 3 L/min FiO2 (%): 30 %   Intake/Output Summary (Last 24 hours) at 07/16/2019 1238 Last data filed at 07/16/2019 0955 Gross per 24 hour  Intake 720 ml  Output 1350 ml  Net -630 ml   Filed Weights   07/14/19 1356 07/15/19  1541 07/16/19 0019  Weight: 130.6 kg 132.5 kg 133 kg    Exam: General exam: In no acute distress. Respiratory system: Good air movement and clear to auscultation. Cardiovascular system: S1 & S2 heard, RRR. Gastrointestinal system: Abdomen is nondistended, soft and nontender.  Extremities: No pedal edema. Skin: No rashes, lesions or ulcers Psychiatry: Judgement and insight appear normal. Mood & affect appropriate.    Data Reviewed:    Labs: Basic Metabolic Panel: Recent Labs  Lab 07/14/19 1506 07/14/19 1506 07/15/19 1510 07/16/19 0340  NA 141  --  143 142  K 3.7   < > 3.7 3.8  CL 90*  --  94* 96*  CO2 36*  --  36* 34*  GLUCOSE 206*  --  118* 131*  BUN 37*  --  30* 30*  CREATININE 0.96  --  0.81 0.86  CALCIUM 10.0  --  10.1 9.3  MG  --   --  2.4  --    < > = values in this interval not displayed.   GFR Estimated Creatinine Clearance: 81.3 mL/min (by C-G formula based on SCr of 0.86 mg/dL). Liver Function Tests: Recent Labs  Lab 07/15/19 1510 07/16/19 0340  AST 21 18  ALT 20 17  ALKPHOS 64 49  BILITOT 0.6 0.6  PROT 7.4 6.1*  ALBUMIN 4.1 3.2*   No results for input(s): LIPASE, AMYLASE in the last 168 hours. No results for input(s): AMMONIA in the last 168 hours. Coagulation profile No results for input(s): INR, PROTIME in the last 168 hours. COVID-19 Labs  Recent Labs    07/15/19 1510  DDIMER 0.43    Lab Results  Component Value Date   SARSCOV2NAA NEGATIVE 07/14/2019   SARSCOV2NAA NEGATIVE 10/20/2018    CBC: Recent Labs  Lab 07/14/19 1506 07/15/19 1510 07/16/19 0340  WBC 6.1 5.7 5.0  HGB 10.8* 11.0* 8.9*  HCT 36.6 37.5 30.5*  MCV 90.6 89.9 90.2  PLT 168 161 126*   Cardiac Enzymes: No results for input(s): CKTOTAL, CKMB, CKMBINDEX, TROPONINI in the last 168 hours. BNP (last 3 results) No results for input(s): PROBNP in the last 8760 hours. CBG: Recent Labs  Lab 07/15/19 0324 07/15/19 1536 07/15/19 2146 07/16/19 0619 07/16/19 1203   GLUCAP 136* 96 214* 105* 210*   D-Dimer: Recent Labs    07/15/19 1510  DDIMER 0.43   Hgb A1c: Recent Labs    07/15/19 1510  HGBA1C 8.2*   Lipid Profile: No results for input(s): CHOL, HDL, LDLCALC, TRIG, CHOLHDL, LDLDIRECT in the last 72 hours. Thyroid function studies: Recent Labs    07/15/19 1510  TSH 3.422   Anemia work up: No results for input(s): VITAMINB12, FOLATE, FERRITIN, TIBC, IRON, RETICCTPCT in the last 72  hours. Sepsis Labs: Recent Labs  Lab 07/14/19 1506 07/15/19 1510 07/16/19 0340  WBC 6.1 5.7 5.0   Microbiology Recent Results (from the past 240 hour(s))  SARS Coronavirus 2 by RT PCR (hospital order, performed in [facility] hospital lab) Nasopharyngeal Nasopharyngeal Swab     Status: None   Collection Time: 07/14/19  8:40 PM   Specimen: Nasopharyngeal Swab  Result Value Ref Range Status   SARS Coronavirus 2 NEGATIVE NEGATIVE Final    Comment: (NOTE) SARS-CoV-2 target nucleic acids are NOT DETECTED.  The SARS-CoV-2 RNA is generally detectable in upper and lower respiratory specimens during the acute phase of infection. The lowest concentration of SARS-CoV-2 viral copies this assay can detect is 250 copies / mL. A negative result does not preclude SARS-CoV-2 infection and should not be used as the sole basis for treatment or other patient management decisions.  A negative result may occur with improper specimen collection / handling, submission of specimen other than nasopharyngeal swab, presence of viral mutation(s) within the areas targeted by this assay, and inadequate number of viral copies (<250 copies / mL). A negative result must be combined with clinical observations, patient history, and epidemiological information.  Fact Sheet for Patients:   BoilerBrush.com.cy  Fact Sheet for Healthcare Providers: https://pope.com/  This test is not yet approved or  cleared by the [place] FDA  and has been authorized for detection and/or diagnosis of SARS-CoV-2 by FDA under an Emergency Use Authorization (EUA).  This EUA will remain in effect (meaning this test can be used) for the duration of the COVID-19 declaration under Section 564(b)(1) of the Act, 21 U.S.C. section 360bbb-3(b)(1), unless the authorization is terminated or revoked sooner.  Performed at [facility], [address] [place], [place] [address]      Medications:   . allopurinol  300 mg Oral Daily  . amLODipine  5 mg Oral Daily  . aspirin EC  81 mg Oral Daily  . enoxaparin (LOVENOX) injection  40 mg Subcutaneous Q24H  . ferrous sulfate  325 mg Oral Daily  . fluticasone furoate-vilanterol  1 puff Inhalation Daily  . furosemide  40 mg Intravenous BID  . insulin aspart  0-15 Units Subcutaneous TID WC  . insulin aspart  0-5 Units Subcutaneous QHS  . insulin glargine  15 Units Subcutaneous Daily  . losartan  100 mg Oral Daily  . metoprolol succinate  50 mg Oral Daily  . montelukast  10 mg Oral QHS  . potassium chloride  40 mEq Oral BID  . rosuvastatin  40 mg Oral Daily  . sodium chloride flush  3 mL Intravenous Once   Continuous Infusions:    LOS: 1 day   Marinda Elk  [facility]  07/16/2019, 12:38 PM

## 2019-07-16 NOTE — Progress Notes (Addendum)
Pt stated she was very tired and requested to be put on BIPAP to sleep. Pt now on BIPAP. Will monitor.   2130 - Pt on 3L [place] to take night time meds and have snack.

## 2019-07-17 DIAGNOSIS — J449 Chronic obstructive pulmonary disease, unspecified: Secondary | ICD-10-CM | POA: Diagnosis present

## 2019-07-17 DIAGNOSIS — I5031 Acute diastolic (congestive) heart failure: Secondary | ICD-10-CM | POA: Diagnosis present

## 2019-07-17 DIAGNOSIS — E119 Type 2 diabetes mellitus without complications: Secondary | ICD-10-CM | POA: Diagnosis not present

## 2019-07-17 DIAGNOSIS — I2510 Atherosclerotic heart disease of native coronary artery without angina pectoris: Secondary | ICD-10-CM | POA: Diagnosis present

## 2019-07-17 DIAGNOSIS — M109 Gout, unspecified: Secondary | ICD-10-CM | POA: Diagnosis present

## 2019-07-17 DIAGNOSIS — G4733 Obstructive sleep apnea (adult) (pediatric): Secondary | ICD-10-CM | POA: Diagnosis present

## 2019-07-17 DIAGNOSIS — Z7982 Long term (current) use of aspirin: Secondary | ICD-10-CM | POA: Diagnosis not present

## 2019-07-17 DIAGNOSIS — R0789 Other chest pain: Secondary | ICD-10-CM

## 2019-07-17 DIAGNOSIS — I248 Other forms of acute ischemic heart disease: Secondary | ICD-10-CM | POA: Diagnosis present

## 2019-07-17 DIAGNOSIS — E1165 Type 2 diabetes mellitus with hyperglycemia: Secondary | ICD-10-CM | POA: Diagnosis not present

## 2019-07-17 DIAGNOSIS — I472 Ventricular tachycardia: Secondary | ICD-10-CM | POA: Diagnosis not present

## 2019-07-17 DIAGNOSIS — E7800 Pure hypercholesterolemia, unspecified: Secondary | ICD-10-CM | POA: Diagnosis present

## 2019-07-17 DIAGNOSIS — Z833 Family history of diabetes mellitus: Secondary | ICD-10-CM | POA: Diagnosis not present

## 2019-07-17 DIAGNOSIS — Z6841 Body Mass Index (BMI) 40.0 and over, adult: Secondary | ICD-10-CM | POA: Diagnosis not present

## 2019-07-17 DIAGNOSIS — N1831 Chronic kidney disease, stage 3a: Secondary | ICD-10-CM | POA: Diagnosis not present

## 2019-07-17 DIAGNOSIS — I130 Hypertensive heart and chronic kidney disease with heart failure and stage 1 through stage 4 chronic kidney disease, or unspecified chronic kidney disease: Secondary | ICD-10-CM | POA: Diagnosis present

## 2019-07-17 DIAGNOSIS — N182 Chronic kidney disease, stage 2 (mild): Secondary | ICD-10-CM | POA: Diagnosis present

## 2019-07-17 DIAGNOSIS — Z79899 Other long term (current) drug therapy: Secondary | ICD-10-CM | POA: Diagnosis not present

## 2019-07-17 DIAGNOSIS — E785 Hyperlipidemia, unspecified: Secondary | ICD-10-CM | POA: Diagnosis present

## 2019-07-17 DIAGNOSIS — I5033 Acute on chronic diastolic (congestive) heart failure: Secondary | ICD-10-CM | POA: Diagnosis present

## 2019-07-17 DIAGNOSIS — Z8249 Family history of ischemic heart disease and other diseases of the circulatory system: Secondary | ICD-10-CM | POA: Diagnosis not present

## 2019-07-17 DIAGNOSIS — I421 Obstructive hypertrophic cardiomyopathy: Secondary | ICD-10-CM | POA: Diagnosis present

## 2019-07-17 DIAGNOSIS — Z888 Allergy status to other drugs, medicaments and biological substances status: Secondary | ICD-10-CM | POA: Diagnosis not present

## 2019-07-17 DIAGNOSIS — R778 Other specified abnormalities of plasma proteins: Secondary | ICD-10-CM | POA: Diagnosis present

## 2019-07-17 DIAGNOSIS — J9611 Chronic respiratory failure with hypoxia: Secondary | ICD-10-CM | POA: Diagnosis present

## 2019-07-17 DIAGNOSIS — E1122 Type 2 diabetes mellitus with diabetic chronic kidney disease: Secondary | ICD-10-CM | POA: Diagnosis present

## 2019-07-17 DIAGNOSIS — I10 Essential (primary) hypertension: Secondary | ICD-10-CM | POA: Diagnosis not present

## 2019-07-17 DIAGNOSIS — I422 Other hypertrophic cardiomyopathy: Secondary | ICD-10-CM | POA: Diagnosis present

## 2019-07-17 DIAGNOSIS — Z20822 Contact with and (suspected) exposure to covid-19: Secondary | ICD-10-CM | POA: Diagnosis present

## 2019-07-17 HISTORY — DX: Acute diastolic (congestive) heart failure: I50.31

## 2019-07-17 LAB — BASIC METABOLIC PANEL
Anion gap: 12 (ref 5–15)
BUN: 27 mg/dL — ABNORMAL HIGH (ref 8–23)
CO2: 32 mmol/L (ref 22–32)
Calcium: 9.2 mg/dL (ref 8.9–10.3)
Chloride: 97 mmol/L — ABNORMAL LOW (ref 98–111)
Creatinine, Ser: 1.01 mg/dL — ABNORMAL HIGH (ref 0.44–1.00)
GFR calc Af Amer: >60 mL/min (ref >60)
GFR calc non Af Amer: 56 mL/min — ABNORMAL LOW (ref >60)
Glucose, Bld: 295 mg/dL — ABNORMAL HIGH (ref 70–99)
Potassium: 4.4 mmol/L (ref 3.5–5.1)
Sodium: 141 mmol/L (ref 135–145)

## 2019-07-17 LAB — URINALYSIS, ROUTINE W REFLEX MICROSCOPIC
Bilirubin Urine: NEGATIVE
Glucose, UA: 150 mg/dL — AB
Ketones, ur: NEGATIVE mg/dL
Leukocytes,Ua: NEGATIVE
Nitrite: NEGATIVE
Protein, ur: NEGATIVE mg/dL
Specific Gravity, Urine: 1.008 (ref 1.005–1.030)
pH: 7 (ref 5.0–8.0)

## 2019-07-17 LAB — GLUCOSE, CAPILLARY
Glucose-Capillary: 110 mg/dL — ABNORMAL HIGH (ref 70–99)
Glucose-Capillary: 234 mg/dL — ABNORMAL HIGH (ref 70–99)
Glucose-Capillary: 183 mg/dL — ABNORMAL HIGH (ref 70–99)
Glucose-Capillary: 118 mg/dL — ABNORMAL HIGH (ref 70–99)

## 2019-07-17 LAB — TROPONIN I (HIGH SENSITIVITY): Troponin I (High Sensitivity): 30 ng/L — ABNORMAL HIGH (ref <18)

## 2019-07-17 MED ORDER — FUROSEMIDE 10 MG/ML IJ SOLN
80.0000 mg | Freq: Two times a day (BID) | INTRAMUSCULAR | Status: DC
  Administered 2019-07-17: 80 mg via INTRAVENOUS
  Filled 2019-07-17: qty 8

## 2019-07-17 MED ORDER — AMLODIPINE BESYLATE 5 MG PO TABS
5.0000 mg | ORAL_TABLET | Freq: Every day | ORAL | Status: DC
  Administered 2019-07-18: 5 mg via ORAL
  Filled 2019-07-17: qty 1

## 2019-07-17 MED ORDER — FUROSEMIDE 10 MG/ML IJ SOLN
40.0000 mg | Freq: Once | INTRAMUSCULAR | Status: AC
  Administered 2019-07-17: 40 mg via INTRAVENOUS
  Filled 2019-07-17: qty 4

## 2019-07-17 NOTE — Progress Notes (Signed)
Progress Note  Patient Name: Morgan Fischer Date of Encounter: 07/17/2019  [facility] Cardiologist: Garwin Brothersajan R Revankar, MD   Subjective   Feels better, still cannot lie flat.    Inpatient Medications    Scheduled Meds: . allopurinol  300 mg Oral Daily  . amLODipine  5 mg Oral Daily  . aspirin EC  81 mg Oral Daily  . enoxaparin (LOVENOX) injection  60 mg Subcutaneous Q24H  . ferrous sulfate  325 mg Oral Daily  . fluticasone furoate-vilanterol  1 puff Inhalation Daily  . furosemide  40 mg Intravenous BID  . insulin aspart  0-15 Units Subcutaneous TID WC  . insulin aspart  0-5 Units Subcutaneous QHS  . insulin aspart  4 Units Subcutaneous TID WC  . insulin glargine  10 Units Subcutaneous BID  . losartan  100 mg Oral Daily  . metoprolol succinate  50 mg Oral Daily  . montelukast  10 mg Oral QHS  . potassium chloride  40 mEq Oral BID  . rosuvastatin  40 mg Oral Daily  . sodium chloride flush  3 mL Intravenous Once   Continuous Infusions:  PRN Meds: acetaminophen **OR** acetaminophen, albuterol, ipratropium-albuterol, morphine injection, nitroGLYCERIN, ondansetron **OR** ondansetron (ZOFRAN) IV   Vital Signs    Vitals:   07/16/19 1206 07/16/19 1949 07/17/19 0421 07/17/19 0745  BP: 135/61  (!) 160/75   Pulse: (!) 57  63   Resp: 18  20   Temp: 97.9 F (36.6 C)  97.8 F (36.6 C)   TempSrc: Oral  Oral   SpO2: 100% 100% 100% 96%  Weight:   133.2 kg   Height:        Intake/Output Summary (Last 24 hours) at 07/17/2019 0929 Last data filed at 07/17/2019 0752 Gross per 24 hour  Intake 960 ml  Output 2950 ml  Net -1990 ml   Last 3 Weights 07/17/2019 07/16/2019 07/15/2019  Weight (lbs) 293 lb 9.6 oz 293 lb 4.8 oz 292 lb 1.8 oz  Weight (kg) 133.176 kg 133.04 kg 132.5 kg      Telemetry    SR 9 beats NSVT yesterday - Personally Reviewed  ECG    No new - Personally Reviewed  Physical Exam   GEN: No acute distress.   Neck: No JVD Cardiac: RRR, 3-4/6 systolic  murmur, no rubs, or gallops.  Respiratory: diminished breath sounds throughout to auscultation bilaterally. GI: obese,Soft, nontender, non-distended  MS: No edema; No deformity. Neuro:  Nonfocal  Psych: Normal affect   Labs    High Sensitivity Troponin:   Recent Labs  Lab 07/16/19 0340 07/16/19 1024 07/16/19 1551 07/16/19 2131 07/17/19 0444  TROPONINIHS 36* 31* 29* 33* 30*      Chemistry Recent Labs  Lab 07/14/19 1506 07/15/19 1510 07/16/19 0340  NA 141 143 142  K 3.7 3.7 3.8  CL 90* 94* 96*  CO2 36* 36* 34*  GLUCOSE 206* 118* 131*  BUN 37* 30* 30*  CREATININE 0.96 0.81 0.86  CALCIUM 10.0 10.1 9.3  PROT  --  7.4 6.1*  ALBUMIN  --  4.1 3.2*  AST  --  21 18  ALT  --  20 17  ALKPHOS  --  64 49  BILITOT  --  0.6 0.6  GFRNONAA 60* >60 >60  GFRAA >60 >60 >60  ANIONGAP 15 13 12      Hematology Recent Labs  Lab 07/14/19 1506 07/15/19 1510 07/16/19 0340  WBC 6.1 5.7 5.0  RBC 4.04 4.17 3.38*  HGB  10.8* 11.0* 8.9*  HCT 36.6 37.5 30.5*  MCV 90.6 89.9 90.2  MCH 26.7 26.4 26.3  MCHC 29.5* 29.3* 29.2*  RDW 16.3* 16.4* 16.5*  PLT 168 161 126*    BNP Recent Labs  Lab 07/14/19 1506  BNP 329.2*     DDimer  Recent Labs  Lab 07/15/19 1510  DDIMER 0.43     Radiology    ECHOCARDIOGRAM COMPLETE  Result Date: 07/16/2019    ECHOCARDIOGRAM REPORT   Patient Name:   Morgan Fischer Date of Exam: 07/16/2019 Medical Rec #:  [id number]    Height:       63.0 in Accession #:    [id number]   Weight:       293.3 lb Date of Birth:  15-Jun-1950    BSA:          2.275 m Patient Age:    68 years     BP:           139/52 mmHg Patient Gender: F            HR:           82 bpm. Exam Location:  Inpatient Procedure: 2D Echo, Cardiac Doppler and Color Doppler Indications:    R07.9* Chest pain, unspecified  History:        Patient has prior history of Echocardiogram examinations, most                 recent 10/21/2018. CHF, COPD; Risk Factors:Hypertension,                 Diabetes and  Dyslipidemia.  Sonographer:    Elmarie Shiley Dance Referring Phys: [id number] Marcelus Dubberly R Ricquel Foulk IMPRESSIONS  1. Severe intracavitary gradient. Peak velocity 4.67 m/s. Peak gradient 87.2 mmHg. Systolic anterior motion of the mitral valve. Findings consistent with HOCM. Left ventricular ejection fraction, by estimation, is 60 to 65%. The left ventricle has normal function. The left ventricle has no regional wall motion abnormalities. There is severe concentric left ventricular hypertrophy. Left ventricular diastolic parameters are consistent with Grade II diastolic dysfunction (pseudonormalization).  2. Right ventricular systolic function is normal. The right ventricular size is normal.  3. Left atrial size was mildly dilated.  4. The mitral valve is normal in structure. No evidence of mitral valve regurgitation. No evidence of mitral stenosis.  5. The aortic valve is normal in structure. Aortic valve regurgitation is not visualized. Moderate aortic valve stenosis. Aortic valve area, by VTI measures 1.08 cm. Aortic valve mean gradient measures 25.0 mmHg. Aortic valve Vmax measures 3.52 m/s.  6. Aortic dilatation noted. There is mild dilatation of the ascending aorta measuring 38 mm.  7. The inferior vena cava is dilated in size with >50% respiratory variability, suggesting right atrial pressure of 8 mmHg. FINDINGS  Left Ventricle: Severe intracavitary gradient. Peak velocity 4.67 m/s. Peak gradient 87.2 mmHg. Systolic anterior motion of the mitral valve. Findings consistent with HOCM. Left ventricular ejection fraction, by estimation, is 60 to 65%. The left ventricle has normal function. The left ventricle has no regional wall motion abnormalities. The left ventricular internal cavity size was normal in size. There is severe concentric left ventricular hypertrophy. Left ventricular diastolic parameters are consistent with Grade II diastolic dysfunction (pseudonormalization). Right Ventricle: The right ventricular size is normal. No  increase in right ventricular wall thickness. Right ventricular systolic function is normal. Left Atrium: Left atrial size was mildly dilated. Right Atrium: Right atrial size was normal in size. Pericardium: There is no  evidence of pericardial effusion. Mitral Valve: The mitral valve is normal in structure. Normal mobility of the mitral valve leaflets. Mild mitral annular calcification. No evidence of mitral valve regurgitation. No evidence of mitral valve stenosis. Tricuspid Valve: The tricuspid valve is normal in structure. Tricuspid valve regurgitation is trivial. No evidence of tricuspid stenosis. Aortic Valve: The aortic valve is normal in structure. Aortic valve regurgitation is not visualized. Moderate aortic stenosis is present. Aortic valve mean gradient measures 25.0 mmHg. Aortic valve peak gradient measures 49.6 mmHg. Aortic valve area, by VTI measures 1.08 cm. Pulmonic Valve: The pulmonic valve was normal in structure. Pulmonic valve regurgitation is not visualized. No evidence of pulmonic stenosis. Aorta: Aortic dilatation noted. There is mild dilatation of the ascending aorta measuring 38 mm. Venous: The inferior vena cava is dilated in size with greater than 50% respiratory variability, suggesting right atrial pressure of 8 mmHg. IAS/Shunts: No atrial level shunt detected by color flow Doppler.  LEFT VENTRICLE PLAX 2D LVIDd:         5.20 cm  Diastology LVIDs:         2.57 cm  LV e' lateral:   8.49 cm/s LV PW:         1.61 cm  LV E/e' lateral: 17.4 LV IVS:        1.97 cm LVOT diam:     1.80 cm LV SV:         92 LV SV Index:   40 LVOT Area:     2.54 cm  RIGHT VENTRICLE             IVC RV Basal diam:  2.20 cm     IVC diam: 2.75 cm RV S prime:     10.30 cm/s TAPSE (M-mode): 2.2 cm LEFT ATRIUM           Index       RIGHT ATRIUM           Index LA diam:      5.50 cm 2.42 cm/m  RA Area:     14.10 cm LA Vol (A2C): 51.0 ml 22.41 ml/m RA Volume:   32.50 ml  14.28 ml/m LA Vol (A4C): 74.0 ml 32.52 ml/m   AORTIC VALVE AV Area (Vmax):    1.13 cm AV Area (Vmean):   1.07 cm AV Area (VTI):     1.08 cm AV Vmax:           352.00 cm/s AV Vmean:          232.000 cm/s AV VTI:            0.846 m AV Peak Grad:      49.6 mmHg AV Mean Grad:      25.0 mmHg LVOT Vmax:         156.00 cm/s LVOT Vmean:        98.000 cm/s LVOT VTI:          0.360 m LVOT/AV VTI ratio: 0.43  AORTA Ao Root diam: 2.80 cm Ao Asc diam:  3.80 cm MITRAL VALVE MV Area (PHT): 3.60 cm     SHUNTS MV Decel Time: 211 msec     Systemic VTI:  0.36 m MV E velocity: 148.00 cm/s  Systemic Diam: 1.80 cm MV A velocity: 75.10 cm/s MV E/A ratio:  1.97 Chilton Si MD Electronically signed by Chilton Si MD Signature Date/Time: 07/16/2019/4:18:06 PM    Final     Cardiac Studies  ECHO 07/16/19  IMPRESSIONS  1. Severe intracavitary gradient. Peak velocity 4.67 m/s. Peak gradient  87.2 mmHg. Systolic anterior motion of the mitral valve. Findings  consistent with HOCM. Left ventricular ejection fraction, by estimation,  is 60 to 65%. The left ventricle has  normal function. The left ventricle has no regional wall motion  abnormalities. There is severe concentric left ventricular hypertrophy.  Left ventricular diastolic parameters are consistent with Grade II  diastolic dysfunction (pseudonormalization).  2. Right ventricular systolic function is normal. The right ventricular  size is normal.  3. Left atrial size was mildly dilated.  4. The mitral valve is normal in structure. No evidence of mitral valve  regurgitation. No evidence of mitral stenosis.  5. The aortic valve is normal in structure. Aortic valve regurgitation is  not visualized. Moderate aortic valve stenosis. Aortic valve area, by VTI  measures 1.08 cm. Aortic valve mean gradient measures 25.0 mmHg. Aortic  valve Vmax measures 3.52 m/s.  6. Aortic dilatation noted. There is mild dilatation of the ascending  aorta measuring 38 mm.  7. The inferior vena cava is dilated  in size with >50% respiratory  variability, suggesting right atrial pressure of 8 mmHg.   FINDINGS  Left Ventricle: Severe intracavitary gradient. Peak velocity 4.67 m/s.  Peak gradient 87.2 mmHg. Systolic anterior motion of the mitral valve.  Findings consistent with HOCM. Left ventricular ejection fraction, by  estimation, is 60 to 65%. The left  ventricle has normal function. The left ventricle has no regional wall  motion abnormalities. The left ventricular internal cavity size was normal  in size. There is severe concentric left ventricular hypertrophy. Left  ventricular diastolic parameters are  consistent with Grade II diastolic dysfunction (pseudonormalization).   Right Ventricle: The right ventricular size is normal. No increase in  right ventricular wall thickness. Right ventricular systolic function is  normal.   Left Atrium: Left atrial size was mildly dilated.   Right Atrium: Right atrial size was normal in size.   Pericardium: There is no evidence of pericardial effusion.   Mitral Valve: The mitral valve is normal in structure. Normal mobility of  the mitral valve leaflets. Mild mitral annular calcification. No evidence  of mitral valve regurgitation. No evidence of mitral valve stenosis.   Tricuspid Valve: The tricuspid valve is normal in structure. Tricuspid  valve regurgitation is trivial. No evidence of tricuspid stenosis.   Aortic Valve: The aortic valve is normal in structure. Aortic valve  regurgitation is not visualized. Moderate aortic stenosis is present.  Aortic valve mean gradient measures 25.0 mmHg. Aortic valve peak gradient  measures 49.6 mmHg. Aortic valve area, by  VTI measures 1.08 cm.   Pulmonic Valve: The pulmonic valve was normal in structure. Pulmonic valve  regurgitation is not visualized. No evidence of pulmonic stenosis.   Aorta: Aortic dilatation noted. There is mild dilatation of the ascending  aorta measuring 38 mm.   Venous: The  inferior vena cava is dilated in size with greater than 50%  respiratory variability, suggesting right atrial pressure of 8 mmHg.   IAS/Shunts: No atrial level shunt detected by color flow Doppler.   Patient Profile     69 y.o. female with a hx of hypertrophic cardiomyopathy, HTN, 02 dependent, COPD, Asthma, HLD, DM-2seen for CP and SOB  Assessment & Plan    1. Acute on chronic diastolic CHF: - Unable to give lasix day of admit due to access issue. Continue IV lasix 40mg  BID. Strict I & O. Watch renal function.  --neg 2070  since yesterday  Though wt is up a Kg.  --labs pending  2. Elevated troponin/Chest pain:  - Will not start IV heparin. This does not seem to represent ACS. - Consider right and left heart cath prior to discharge. (she may not be able to be flat or near flat for procedure.) - Continue ASA, Statin and BB - troponin all flat 36 to 30   3. HOCM:  - Severe intracavitary gradient. Peak velocity 4.67 m/s. Peak gradient  87.2 mmHg. Systolic anterior motion of the mitral valve. Findings  consistent with HOCM. Normal EF 60-65% G2DD   4. HTN: - BP stable on current medications   5. NSVT - she did felt palpitations - Continue BB on 50 mg daily  - Mg is 2.4 - K 3.8 (keep above 4 with diuresis)>> will supplement   6.  Moderate AS stable.   DM, COPD, OSA with CPAP per IM and anemia.      For questions or updates, please contact [facility] Please consult [email or web address] for contact info under        Signed, Nada Boozer, NP  07/17/2019, 9:29 AM

## 2019-07-17 NOTE — Progress Notes (Signed)
[facility] PROGRESS NOTE    Progress Note  Morgan Fischer  OEH:[id number] DOB: 30-Jun-1950 DOA: 07/14/2019 PCP: Jackie Plum, MD     Brief Narrative:   Morgan Fischer is an 69 y.o. female past medical history significant for [place], chronic hypoxic respiratory failure secondary to underlying COPD on 3 L obstructive sleep apnea on BiPAP at night, morbid obesity, diabetes mellitus type 2 comes into the ED for 3 days of chest pain.  Assessment/Plan:   Exertional chest pain with shortness of breath: Troponins basically flat. Unlikely ACS likely due to acute diastolic heart failure.  Acute on chronic diastolic heart failure: She was started on IV Lasix, has diuresed about 2 L. Continue strict I's and O's and daily weights restrict her fluid intake. Monitor electrolytes and replete as needed. Estimated dry weight is around 130 kg, her weight is still 133 kg. Basic metabolic panel is pending this morning. Place TED hose.  Essential hypertension: Continue amlodipine, metoprolol and losartan seems to be fairly controlled.  Hyperlipidemia: Continue statins.  Uncontrolled diabetes mellitus: Hold Metformin, continue Amaryl. Hemoglobin A1c is 8.2. Blood glucose fairly controlled, continue Amaryl long-acting insulin plus sliding scale.  Chronic respiratory failure with hypoxia due to underlying COPD: No wheezing on physical exam continue current home of oxygen. Remain afebrile with no leukocytosis.  Gout: Continue allopurinol.  DVT prophylaxis: lovenox Family Communication:none Status is: Observation  The patient will require care spanning > 2 midnights and should be moved to inpatient because: Hemodynamically unstable  Dispo: The patient is from: Home              Anticipated d/c is to: SNF              Anticipated d/c date is: 2 days              Patient currently is not medically stable to d/c.  Still requiring 3 L of oxygen to keep saturations greater than  96%.        Code Status:     Code Status Orders  (From admission, onward)         Start     Ordered   07/15/19 1229  Full code  Continuous        07/15/19 1229        Code Status History    Date Active Date Inactive Code Status Order ID Comments User Context   10/21/2018 0934 10/24/2018 2056 Full Code [id number]  Briant Cedar, MD Inpatient   08/24/2015 0257 08/28/2015 1720 Full Code [id number]  Lorretta Harp, MD Inpatient   Advance Care Planning Activity        IV Access:    Peripheral IV   Procedures and diagnostic studies:   ECHOCARDIOGRAM COMPLETE  Result Date: 07/16/2019    ECHOCARDIOGRAM REPORT   Patient Name:   Morgan Fischer Date of Exam: 07/16/2019 Medical Rec #:  [id number]    Height:       63.0 in Accession #:    [id number]   Weight:       293.3 lb Date of Birth:  November 20, 1950    BSA:          2.275 m Patient Age:    68 years     BP:           139/52 mmHg Patient Gender: F            HR:           82 bpm. Exam Location:  Inpatient  Procedure: 2D Echo, Cardiac Doppler and Color Doppler Indications:    R07.9* Chest pain, unspecified  History:        Patient has prior history of Echocardiogram examinations, most                 recent 10/21/2018. CHF, COPD; Risk Factors:Hypertension,                 Diabetes and Dyslipidemia.  Sonographer:    Elmarie Shiley Dance Referring Phys: [id number] LAURA R INGOLD IMPRESSIONS  1. Severe intracavitary gradient. Peak velocity 4.67 m/s. Peak gradient 87.2 mmHg. Systolic anterior motion of the mitral valve. Findings consistent with HOCM. Left ventricular ejection fraction, by estimation, is 60 to 65%. The left ventricle has normal function. The left ventricle has no regional wall motion abnormalities. There is severe concentric left ventricular hypertrophy. Left ventricular diastolic parameters are consistent with Grade II diastolic dysfunction (pseudonormalization).  2. Right ventricular systolic function is normal. The right ventricular size is  normal.  3. Left atrial size was mildly dilated.  4. The mitral valve is normal in structure. No evidence of mitral valve regurgitation. No evidence of mitral stenosis.  5. The aortic valve is normal in structure. Aortic valve regurgitation is not visualized. Moderate aortic valve stenosis. Aortic valve area, by VTI measures 1.08 cm. Aortic valve mean gradient measures 25.0 mmHg. Aortic valve Vmax measures 3.52 m/s.  6. Aortic dilatation noted. There is mild dilatation of the ascending aorta measuring 38 mm.  7. The inferior vena cava is dilated in size with >50% respiratory variability, suggesting right atrial pressure of 8 mmHg. FINDINGS  Left Ventricle: Severe intracavitary gradient. Peak velocity 4.67 m/s. Peak gradient 87.2 mmHg. Systolic anterior motion of the mitral valve. Findings consistent with HOCM. Left ventricular ejection fraction, by estimation, is 60 to 65%. The left ventricle has normal function. The left ventricle has no regional wall motion abnormalities. The left ventricular internal cavity size was normal in size. There is severe concentric left ventricular hypertrophy. Left ventricular diastolic parameters are consistent with Grade II diastolic dysfunction (pseudonormalization). Right Ventricle: The right ventricular size is normal. No increase in right ventricular wall thickness. Right ventricular systolic function is normal. Left Atrium: Left atrial size was mildly dilated. Right Atrium: Right atrial size was normal in size. Pericardium: There is no evidence of pericardial effusion. Mitral Valve: The mitral valve is normal in structure. Normal mobility of the mitral valve leaflets. Mild mitral annular calcification. No evidence of mitral valve regurgitation. No evidence of mitral valve stenosis. Tricuspid Valve: The tricuspid valve is normal in structure. Tricuspid valve regurgitation is trivial. No evidence of tricuspid stenosis. Aortic Valve: The aortic valve is normal in structure. Aortic  valve regurgitation is not visualized. Moderate aortic stenosis is present. Aortic valve mean gradient measures 25.0 mmHg. Aortic valve peak gradient measures 49.6 mmHg. Aortic valve area, by VTI measures 1.08 cm. Pulmonic Valve: The pulmonic valve was normal in structure. Pulmonic valve regurgitation is not visualized. No evidence of pulmonic stenosis. Aorta: Aortic dilatation noted. There is mild dilatation of the ascending aorta measuring 38 mm. Venous: The inferior vena cava is dilated in size with greater than 50% respiratory variability, suggesting right atrial pressure of 8 mmHg. IAS/Shunts: No atrial level shunt detected by color flow Doppler.  LEFT VENTRICLE PLAX 2D LVIDd:         5.20 cm  Diastology LVIDs:         2.57 cm  LV e' lateral:   8.49 cm/s  LV PW:         1.61 cm  LV E/e' lateral: 17.4 LV IVS:        1.97 cm LVOT diam:     1.80 cm LV SV:         92 LV SV Index:   40 LVOT Area:     2.54 cm  RIGHT VENTRICLE             IVC RV Basal diam:  2.20 cm     IVC diam: 2.75 cm RV S prime:     10.30 cm/s TAPSE (M-mode): 2.2 cm LEFT ATRIUM           Index       RIGHT ATRIUM           Index LA diam:      5.50 cm 2.42 cm/m  RA Area:     14.10 cm LA Vol (A2C): 51.0 ml 22.41 ml/m RA Volume:   32.50 ml  14.28 ml/m LA Vol (A4C): 74.0 ml 32.52 ml/m  AORTIC VALVE AV Area (Vmax):    1.13 cm AV Area (Vmean):   1.07 cm AV Area (VTI):     1.08 cm AV Vmax:           352.00 cm/s AV Vmean:          232.000 cm/s AV VTI:            0.846 m AV Peak Grad:      49.6 mmHg AV Mean Grad:      25.0 mmHg LVOT Vmax:         156.00 cm/s LVOT Vmean:        98.000 cm/s LVOT VTI:          0.360 m LVOT/AV VTI ratio: 0.43  AORTA Ao Root diam: 2.80 cm Ao Asc diam:  3.80 cm MITRAL VALVE MV Area (PHT): 3.60 cm     SHUNTS MV Decel Time: 211 msec     Systemic VTI:  0.36 m MV E velocity: 148.00 cm/s  Systemic Diam: 1.80 cm MV A velocity: 75.10 cm/s MV E/A ratio:  1.97 Chilton Si MD Electronically signed by Chilton Si MD  Signature Date/Time: 07/16/2019/4:18:06 PM    Final      Medical Consultants:    None.  Anti-Infectives:   none  Subjective:    Morgan Fischer relates her breathing is about the same  Objective:    Vitals:   07/16/19 1206 07/16/19 1949 07/17/19 0421 07/17/19 0745  BP: 135/61  (!) 160/75   Pulse: (!) 57  63   Resp: 18  20   Temp: 97.9 F (36.6 C)  97.8 F (36.6 C)   TempSrc: Oral  Oral   SpO2: 100% 100% 100% 96%  Weight:   133.2 kg   Height:       SpO2: 96 % O2 Flow Rate (L/min): 3 L/min FiO2 (%): 30 %   Intake/Output Summary (Last 24 hours) at 07/17/2019 1004 Last data filed at 07/17/2019 0752 Gross per 24 hour  Intake 960 ml  Output 2400 ml  Net -1440 ml   Filed Weights   07/15/19 1541 07/16/19 0019 07/17/19 0421  Weight: 132.5 kg 133 kg 133.2 kg    Exam: General exam: In no acute distress. Respiratory system: Good air movement and clear to auscultation. Cardiovascular system: S1 & S2 heard, RRR. Gastrointestinal system: Abdomen is nondistended, soft and nontender.  Extremities: No pedal edema. Skin: No rashes, lesions or ulcers  Psychiatry: Judgement and insight appear normal. Mood & affect appropriate.    Data Reviewed:    Labs: Basic Metabolic Panel: Recent Labs  Lab 07/14/19 1506 07/14/19 1506 07/15/19 1510 07/16/19 0340  NA 141  --  143 142  K 3.7   < > 3.7 3.8  CL 90*  --  94* 96*  CO2 36*  --  36* 34*  GLUCOSE 206*  --  118* 131*  BUN 37*  --  30* 30*  CREATININE 0.96  --  0.81 0.86  CALCIUM 10.0  --  10.1 9.3  MG  --   --  2.4  --    < > = values in this interval not displayed.   GFR Estimated Creatinine Clearance: 81.4 mL/min (by C-G formula based on SCr of 0.86 mg/dL). Liver Function Tests: Recent Labs  Lab 07/15/19 1510 07/16/19 0340  AST 21 18  ALT 20 17  ALKPHOS 64 49  BILITOT 0.6 0.6  PROT 7.4 6.1*  ALBUMIN 4.1 3.2*   No results for input(s): LIPASE, AMYLASE in the last 168 hours. No results for input(s):  AMMONIA in the last 168 hours. Coagulation profile No results for input(s): INR, PROTIME in the last 168 hours. COVID-19 Labs  Recent Labs    07/15/19 1510  DDIMER 0.43    Lab Results  Component Value Date   SARSCOV2NAA NEGATIVE 07/14/2019   SARSCOV2NAA NEGATIVE 10/20/2018    CBC: Recent Labs  Lab 07/14/19 1506 07/15/19 1510 07/16/19 0340  WBC 6.1 5.7 5.0  HGB 10.8* 11.0* 8.9*  HCT 36.6 37.5 30.5*  MCV 90.6 89.9 90.2  PLT 168 161 126*   Cardiac Enzymes: No results for input(s): CKTOTAL, CKMB, CKMBINDEX, TROPONINI in the last 168 hours. BNP (last 3 results) No results for input(s): PROBNP in the last 8760 hours. CBG: Recent Labs  Lab 07/16/19 0619 07/16/19 1203 07/16/19 1615 07/16/19 2127 07/17/19 0557  GLUCAP 105* 210* 220* 111* 110*   D-Dimer: Recent Labs    07/15/19 1510  DDIMER 0.43   Hgb A1c: Recent Labs    07/15/19 1510  HGBA1C 8.2*   Lipid Profile: No results for input(s): CHOL, HDL, LDLCALC, TRIG, CHOLHDL, LDLDIRECT in the last 72 hours. Thyroid function studies: Recent Labs    07/15/19 1510  TSH 3.422   Anemia work up: No results for input(s): VITAMINB12, FOLATE, FERRITIN, TIBC, IRON, RETICCTPCT in the last 72 hours. Sepsis Labs: Recent Labs  Lab 07/14/19 1506 07/15/19 1510 07/16/19 0340  WBC 6.1 5.7 5.0   Microbiology Recent Results (from the past 240 hour(s))  SARS Coronavirus 2 by RT PCR (hospital order, performed in [facility] hospital lab) Nasopharyngeal Nasopharyngeal Swab     Status: None   Collection Time: 07/14/19  8:40 PM   Specimen: Nasopharyngeal Swab  Result Value Ref Range Status   SARS Coronavirus 2 NEGATIVE NEGATIVE Final    Comment: (NOTE) SARS-CoV-2 target nucleic acids are NOT DETECTED.  The SARS-CoV-2 RNA is generally detectable in upper and lower respiratory specimens during the acute phase of infection. The lowest concentration of SARS-CoV-2 viral copies this assay can detect is 250 copies / mL. A  negative result does not preclude SARS-CoV-2 infection and should not be used as the sole basis for treatment or other patient management decisions.  A negative result may occur with improper specimen collection / handling, submission of specimen other than nasopharyngeal swab, presence of viral mutation(s) within the areas targeted by this assay, and inadequate number of viral copies (<  250 copies / mL). A negative result must be combined with clinical observations, patient history, and epidemiological information.  Fact Sheet for Patients:   BoilerBrush.com.cyhttps://www.fda.gov/media/136312/download  Fact Sheet for Healthcare Providers: https://pope.com/https://www.fda.gov/media/136313/download  This test is not yet approved or  cleared by the [place] FDA and has been authorized for detection and/or diagnosis of SARS-CoV-2 by FDA under an Emergency Use Authorization (EUA).  This EUA will remain in effect (meaning this test can be used) for the duration of the COVID-19 declaration under Section 564(b)(1) of the Act, 21 U.S.C. section 360bbb-3(b)(1), unless the authorization is terminated or revoked sooner.  Performed at [facility] [place], [address] [place], [place] [address]      Medications:    allopurinol  300 mg Oral Daily   amLODipine  5 mg Oral Daily   aspirin EC  81 mg Oral Daily   enoxaparin (LOVENOX) injection  60 mg Subcutaneous Q24H   ferrous sulfate  325 mg Oral Daily   fluticasone furoate-vilanterol  1 puff Inhalation Daily   furosemide  40 mg Intravenous BID   insulin aspart  0-15 Units Subcutaneous TID WC   insulin aspart  0-5 Units Subcutaneous QHS   insulin aspart  4 Units Subcutaneous TID WC   insulin glargine  10 Units Subcutaneous BID   losartan  100 mg Oral Daily   metoprolol succinate  50 mg Oral Daily   montelukast  10 mg Oral QHS   potassium chloride  40 mEq Oral BID   rosuvastatin  40 mg Oral Daily   sodium chloride flush  3 mL Intravenous Once    Continuous Infusions:    LOS: 1 day   Marinda ElkAbraham Feliz Ortiz  [facility]  07/17/2019, 10:04 AM

## 2019-07-17 NOTE — Plan of Care (Signed)

## 2019-07-18 LAB — GLUCOSE, CAPILLARY
Glucose-Capillary: 117 mg/dL — ABNORMAL HIGH (ref 70–99)
Glucose-Capillary: 80 mg/dL (ref 70–99)
Glucose-Capillary: 233 mg/dL — ABNORMAL HIGH (ref 70–99)
Glucose-Capillary: 172 mg/dL — ABNORMAL HIGH (ref 70–99)
Glucose-Capillary: 126 mg/dL — ABNORMAL HIGH (ref 70–99)
Glucose-Capillary: 148 mg/dL — ABNORMAL HIGH (ref 70–99)

## 2019-07-18 LAB — CBC
HCT: 31.2 % — ABNORMAL LOW (ref 36.0–46.0)
Hemoglobin: 9.2 g/dL — ABNORMAL LOW (ref 12.0–15.0)
MCH: 26.9 pg (ref 26.0–34.0)
MCHC: 29.5 g/dL — ABNORMAL LOW (ref 30.0–36.0)
MCV: 91.2 fL (ref 80.0–100.0)
Platelets: 139 10*3/uL — ABNORMAL LOW (ref 150–400)
RBC: 3.42 MIL/uL — ABNORMAL LOW (ref 3.87–5.11)
RDW: 16.5 % — ABNORMAL HIGH (ref 11.5–15.5)
WBC: 5.3 10*3/uL (ref 4.0–10.5)
nRBC: 0 % (ref 0.0–0.2)

## 2019-07-18 LAB — BASIC METABOLIC PANEL
Anion gap: 9 (ref 5–15)
BUN: 29 mg/dL — ABNORMAL HIGH (ref 8–23)
CO2: 36 mmol/L — ABNORMAL HIGH (ref 22–32)
Calcium: 9.3 mg/dL (ref 8.9–10.3)
Chloride: 98 mmol/L (ref 98–111)
Creatinine, Ser: 1.03 mg/dL — ABNORMAL HIGH (ref 0.44–1.00)
GFR calc Af Amer: >60 mL/min (ref >60)
GFR calc non Af Amer: 55 mL/min — ABNORMAL LOW (ref >60)
Glucose, Bld: 82 mg/dL (ref 70–99)
Potassium: 4.5 mmol/L (ref 3.5–5.1)
Sodium: 143 mmol/L (ref 135–145)

## 2019-07-18 LAB — SODIUM, URINE, RANDOM: Sodium, Ur: 88 mmol/L

## 2019-07-18 MED ORDER — POTASSIUM CHLORIDE CRYS ER 20 MEQ PO TBCR
40.0000 meq | EXTENDED_RELEASE_TABLET | Freq: Two times a day (BID) | ORAL | Status: DC
  Administered 2019-07-18 – 2019-07-21 (×7): 40 meq via ORAL
  Filled 2019-07-18 (×7): qty 2

## 2019-07-18 MED ORDER — FUROSEMIDE 10 MG/ML IJ SOLN
120.0000 mg | Freq: Two times a day (BID) | INTRAVENOUS | Status: DC
  Administered 2019-07-18 – 2019-07-19 (×3): 120 mg via INTRAVENOUS
  Filled 2019-07-18: qty 10
  Filled 2019-07-18 (×3): qty 12
  Filled 2019-07-18: qty 10

## 2019-07-18 NOTE — Consult Note (Signed)
   [facility] Inpatient Consult   07/18/2019  Morgan Fischer 11/28/1950 [id number]  [place]: [employer]  Patient was assessed for [facility] for community services. Patient was previously active with [facility].  Met with patient at bedside, HIPAA verified,  regarding being restarted with [facility] services. Patient states she remembers the services a couple of years ago.  She currently feels that she is doing alright once getting treatment and fliud.  Plan: A brochure was given, follow for progress and disposition needs and assign if appropriate.  Of note, [facility] Management services does not replace or interfere with any services that are arranged by inpatient [facility] care management team.   For additional questions or referrals please contact:  Natividad Brood, RN BSN [facility] Liaison  [phone] business mobile phone Toll free office [phone]  Fax number: [phone] [name]

## 2019-07-18 NOTE — Discharge Instructions (Signed)
Weigh daily and call the office if weight up by 3 pounds in a day or 5 pounds in a week.  Low salt diet with diabetic diet.   No more than 2000 mg sodium per day 1500 cc fluid restriction   Low Sodium Nutrition Therapy  Eating less sodium can help you if you have high blood pressure, heart failure, or kidney or liver disease.   Your body needs a little sodium, but too much sodium can cause your body to hold onto extra water. This extra water will raise your blood pressure and can cause damage to your heart, kidneys, or liver as they are forced to work harder.   Sometimes you can see how the extra fluid affects you because your hands, legs, or belly swell. You may also hold water around your heart and lungs, which makes it hard to breathe.   Even if you take medication for blood pressure or a water pill (diuretic) to remove fluid, it is still important to have less salt in your diet.   Check with your primary care provider before drinking alcohol since it may affect the amount of fluid in your body and how your heart, kidneys, or liver work. Sodium in Food A low-sodium meal plan limits the sodium that you get from food and beverages to 1,500-2,000 milligrams (mg) per day. Salt is the main source of sodium. Read the nutrition label on the package to find out how much sodium is in one serving of a food.   Select foods with 140 milligrams (mg) of sodium or less per serving.   You may be able to eat one or two servings of foods with a little more than 140 milligrams (mg) of sodium if you are closely watching how much sodium you eat in a day.   Check the serving size on the label. The amount of sodium listed on the label shows the amount in one serving of the food. So, if you eat more than one serving, you will get more sodium than the amount listed.  Tips Cutting Back on Sodium  Eat more fresh foods.   Fresh fruits and vegetables are low in sodium, as well as frozen vegetables and fruits that  have no added juices or sauces.   Fresh meats are lower in sodium than processed meats, such as bacon, sausage, and hotdogs.   Not all processed foods are unhealthy, but some processed foods may have too much sodium.   Eat less salt at the table and when cooking. One of the ingredients in salt is sodium.   One teaspoon of table salt has 2,300 milligrams of sodium.   Leave the salt out of recipes for pasta, casseroles, and soups.  Be a Engineer, building services.   Food packages that say Salt-free, sodium-free, very low sodium, and low sodium have less than 140 milligrams of sodium per serving.   Beware of products identified as Unsalted, No Salt Added, Reduced Sodium, or Lower Sodium. These items may still be high in sodium. You should always check the nutrition label.  Add flavors to your food without adding sodium.   Try lemon juice, lime juice, or vinegar.   Dry or fresh herbs add flavor.   Buy a sodium-free seasoning blend or make your own at home.  You can purchase salt-free or sodium-free condiments like barbeque sauce in stores and online. Ask your registered dietitian nutritionist for recommendations and where to find them.    Eating in Restaurants  Choose foods  carefully when you eat outside your home. Restaurant foods can be very high in sodium. Many restaurants provide nutrition facts on their menus or their websites. If you cannot find that information, ask your server. Let your server know that you want your food to be cooked without salt and that you would like your salad dressing and sauces to be served on the side.      Foods Recommended  Food Group  Foods Recommended   Grains  Bread, bagels, rolls without salted tops Homemade bread made with reduced-sodium baking powder Cold cereals, especially shredded wheat and puffed rice Oats, grits, or cream of wheat Pastas, quinoa, and rice Popcorn, pretzels or crackers without salt Corn tortillas   Protein  Foods  Fresh meats and fish; [place] bacon (check the nutrition labels - make sure they are not packaged in a sodium solution) Canned or packed tuna (no more than 4 ounces at 1 serving) Beans and peas Soybeans) and tofu Eggs Nuts or nut butters without salt   Dairy  Milk or milk powder Plant milks, such as rice and soy Yogurt, including Greek yogurt Small amounts of natural cheese (blocks of cheese) or reduced-sodium cheese can be used in moderation. (Swiss, ricotta, and fresh mozzarella cheese are lower in sodium than the others) Cream Cheese Low sodium cottage cheese   Vegetables  Fresh and frozen vegetables without added sauces or salt Homemade soups (without salt) Low-sodium, salt-free or sodium-free canned vegetables and soups   Fruit  Fresh and canned fruits Dried fruits, such as raisins, cranberries, and prunes   Oils  Tub or liquid margarine, regular or without salt [place], corn, peanut, olive, safflower, or sunflower oils   Condiments  Fresh or dried herbs such as basil, bay leaf, dill, mustard (dry), nutmeg, paprika, parsley, rosemary, sage, or thyme.  Low sodium ketchup Vinegar  Lemon or lime juice Pepper, red pepper flakes, and cayenne. Hot sauce contains sodium, but if you use just a drop or two, it will not add up to much.  Salt-free or sodium-free seasoning mixes and marinades Simple salad dressings: vinegar and oil     Foods Not Recommended  Food Group  Foods Not Recommended   Grains  Breads or crackers topped with salt Cereals (hot/cold) with more than 300 mg sodium per serving Biscuits, cornbread, and other quick breads prepared with baking soda Pre-packaged bread crumbs Seasoned and packaged rice and pasta mixes Self-rising flours   Protein Foods  Cured meats: Bacon, ham, sausage, pepperoni and hot dogs Canned meats (chili, vienna sausage, or sardines) Smoked fish and meats Frozen meals that have more than 600 mg of sodium per serving Egg  substitute (with added sodium)   Dairy  Buttermilk Processed cheese spreads Cottage cheese (1 cup may have over 500 mg of sodium; look for low-sodium.) American or feta cheese Shredded Cheese has more sodium than blocks of cheese String cheese   Vegetables  Canned vegetables (unless they are salt-free, sodium-free or low sodium) Frozen vegetables with seasoning and sauces Sauerkraut and pickled vegetables Canned or dried soups (unless they are salt-free, sodium-free, or low sodium) [place] fries and onion rings   Fruit  Dried fruits preserved with additives that have sodium   Oils  Salted butter or margarine, all types of olives   Condiments  Salt, sea salt, kosher salt, onion salt, and garlic salt Seasoning mixes with salt Bouillon cubes Ketchup Barbeque sauce and Worcestershire sauce unless low sodium Soy sauce Salsa, pickles, olives, relish Salad dressings: ranch,  blue cheese, [place], and [place].     Low Sodium Sample 1-Day Menu   Breakfast  1 cup cooked oatmeal   1 slice whole wheat bread toast   1 tablespoon peanut butter without salt   1 banana   1 cup 1% milk   Lunch  Tacos made with: 2 corn tortillas    cup black beans, low sodium    cup roasted or grilled chicken (without skin)    avocado   Squeeze of lime juice   1 cup salad greens   1 tablespoon low-sodium salad dressing    cup strawberries   1 orange   Afternoon Snack  1/3 cup grapes   6 ounces yogurt   Evening Meal  3 ounces herb-baked fish   1 baked potato   2 teaspoons olive oil    cup cooked carrots   2 thick slices tomatoes on:   2 lettuce leaves   1 teaspoon olive oil   1 teaspoon balsamic vinegar   1 cup 1% milk   Evening Snack  1 apple    cup almonds without salt     Low-Sodium Vegetarian (Lacto-Ovo) Sample 1-Day Menu   Breakfast  1 cup cooked oatmeal   1 slice whole wheat toast   1 tablespoon peanut butter without salt   1 banana    1 cup 1% milk   Lunch  Tacos made with: 2 corn tortillas    cup black beans, low sodium    cup roasted or grilled chicken (without skin)    avocado   Squeeze of lime juice   1 cup salad greens   1 tablespoon low-sodium salad dressing    cup strawberries   1 orange   Evening Meal  Stir fry made with:  cup tofu   1 cup brown rice    cup broccoli    cup green beans    cup peppers    tablespoon peanut oil   1 orange   1 cup 1% milk   Evening Snack  4 strips celery   2 tablespoons hummus   1 hard-boiled egg     Low-Sodium Vegan Sample 1-Day Menu   Breakfast  1 cup cooked oatmeal   1 tablespoon peanut butter without salt   1 cup blueberries   1 cup soymilk fortified with calcium, vitamin B12, and vitamin D   Lunch  1 small whole wheat pita    cup cooked lentils   2 tablespoons hummus   4 carrot sticks   1 medium apple   1 cup soymilk fortified with calcium, vitamin B12, and vitamin D   Evening Meal  Stir fry made with:  cup tofu   1 cup brown rice    cup broccoli    cup green beans    cup peppers    tablespoon peanut oil   1 cup cantaloupe   Evening Snack  1 cup soy yogurt    cup mixed nuts   Copyright 2020  [employer]. All rights reserved    Sodium Free Flavoring Tips    When cooking, the following items may be used for flavoring instead of salt or seasonings that contain sodium.  Remember: A little bit of spice goes a long way! Be careful not to overseason.  Spice Blend Recipe (makes about ? cup)  5 teaspoons onion powder   2 teaspoons garlic powder   2 teaspoons paprika   2 teaspoon dry mustard   1 teaspoon  crushed thyme leaves    teaspoon white pepper    teaspoon celery seed Food Item Flavorings  Beef Basil, bay leaf, caraway, curry, dill, dry mustard, garlic, grape jelly, green pepper, mace, marjoram, mushrooms (fresh), nutmeg, onion or onion powder,  parsley, pepper, rosemary, sage  Chicken Basil, cloves, cranberries, mace, mushrooms (fresh), nutmeg, oregano, paprika, parsley, pineapple, saffron, sage, savory, tarragon, thyme, tomato, turmeric  Egg Chervil, curry, dill, dry mustard, garlic or garlic powder, green pepper, jelly, mushrooms (fresh), nutmeg, onion powder, paprika, parsley, rosemary, tarragon, tomato  Fish Basil, bay leaf, chervil, curry, dill, dry mustard, green pepper, lemon juice, marjoram, mushrooms (fresh), paprika, pepper, tarragon, tomato, turmeric  Lamb Cloves, curry, dill, garlic or garlic powder, mace, mint, mint jelly, onion, oregano, parsley, pineapple, rosemary, tarragon, thyme  Pork Applesauce, basil, caraway, chives, cloves, garlic or garlic powder, onion or onion powder, rosemary, thyme  Veal Apricots, basil, bay leaf, currant jelly, curry, ginger, marjoram, mushrooms (fresh), oregano, paprika  Vegetables Basil, dill, garlic or garlic powder, ginger, lemon juice, mace, marjoram, nutmeg, onion or onion powder, tarragon, tomato, sugar or sugar substitute, salt-free salad dressing, vinegar  Desserts Allspice, anise, cinnamon, cloves, ginger, mace, nutmeg, vanilla extract, other extracts   Copyright 2020  Academy [employer]. All rights reserved  Fluid Restricted Nutrition Therapy  You have been prescribed this diet because your condition affects how much fluid you can eat or drink. If your heart, liver, or kidneys aren't working properly, you may not be able to effectively eliminate fluids from the body and this may cause swelling (edema) in the legs, arms, and/or stomach. Drink no more than _________ liters or ________ ounces or ________cups of fluid per day.   You don't need to stop eating or drinking the same fluids you normally would, but you may need to eat or drink less than usual.   Your registered dietitian nutritionist will help you determine the correct amount of fluid to consume during the  day Breakfast Include fluids taken with medications  Lunch Include fluids taken with medications  Dinner Include fluids taken with medications  Bedtime Snack Include fluids taken with medications     Tips What Are Fluids?  A fluid is anything that is liquid or anything that would melt if left at room temperature. You will need to count these foods and liquids--including any liquid used to take medication--as part of your daily fluid intake. Some examples are:  Alcohol (drink only with your doctor's permission)   Coffee, tea, and other hot beverages   Gelatin (Jell-O)   Gravy   Ice cream, sherbet, sorbet   Ice cubes, ice chips   Milk, liquid creamer   Nutritional supplements   Popsicles   Vegetable and fruit juices; fluid in canned fruit   Watermelon   Yogurt   Soft drinks, lemonade, limeade   Soups   Syrup How Do I Measure My Fluid Intake?  Record your fluid intake daily.   Tip: Every day, each time you eat or drink fluids, pour water in the same amount into an empty container that can hold the same amount of fluids you are allowed daily. This may help you keep track of how much fluid you are taking in throughout the day.   To accurately keep track of how much liquid you take in, measure the size of the cups, glasses, and bowls you use. If you eat soup, measure how much of it is liquid and how much is solid (such as noodles, vegetables,  meat). Conversions for Measuring Fluid Intake  Milliliters (mL) Liters (L) Ounces (oz) Cups (c)  1000 1 32 4  1200 1.2 40 5  1500 1.5 50 6 1/4  1800 1.8 60 7 1/2  2000 2 67 8 1/3  Tips to Reduce Your Thirst  Chew gum or suck on hard candy.   Rinse or gargle with mouthwash. Do not swallow.   Ice chips or popsicles my help quench thirst, but this too needs to be calculated into the total restriction. Melt ice chips or cubes first to figure out how much fluid they produce (for example, experiment with melting  cup ice chips  or 2 ice cubes).   Add a lemon wedge to your water.   Limit how much salt you take in. A high salt intake might make you thirstier.   Don't eat or drink all your allowed liquids at once. Space your liquids out through the day.   Use small glasses and cups and sip slowly. If allowed, take your medications with fluids you eat or drink during a meal.   Fluid-Restricted Nutrition Therapy Sample 1-Day Menu  Breakfast 1 slice wheat toast  1 tablespoon peanut butter  1/2 cup yogurt (120 milliliters)  1/2 cup blueberries  1 cup milk (240 milliliters)   Lunch 3 ounces sliced [place]  2 slices whole wheat bread  1/2 cup lettuce for sandwich  2 slices tomato for sandwich  1 ounce reduced-fat, reduced-sodium cheese  1/2 cup fresh carrot sticks  1 banana  1 cup unsweetened tea (240 milliliters)   Evening Meal 8 ounces soup (240 milliliters)  3 ounces salmon  1/2 cup quinoa  1 cup green beans  1 cup mixed greens salad  1 tablespoon olive oil  1 cup coffee (240 milliliters)  Evening Snack 1/2 cup sliced peaches  1/2 cup frozen yogurt (120 milliliters)  1 cup water (240 milliliters)  Copyright 2020  [employer]. All rights reserved  Roslyn SmilingStephanie Jamesha Ellsworth, MS, RD, LDN Clinical Dietitian Office phone [phone]

## 2019-07-18 NOTE — Plan of Care (Signed)

## 2019-07-18 NOTE — Progress Notes (Signed)
[facility] PROGRESS NOTE    Progress Note  Morgan BeachJulia Kawai  ZOX:[id number] DOB: 12/19/1950 DOA: 07/14/2019 PCP: Jackie Plumsei-Bonsu, George, MD     Brief Narrative:   Morgan Fischer is an 69 y.o. female past medical history significant for Holcomb, chronic hypoxic respiratory failure secondary to underlying COPD on 3 L obstructive sleep apnea on BiPAP at night, morbid obesity, diabetes mellitus type 2 comes into the ED for 3 days of chest pain.  Assessment/Plan:   Exertional chest pain with shortness of breath: Troponins basically flat. Unlikely ACS likely due to acute diastolic heart failure.  Acute on chronic diastolic heart failure: Continue IV Lasix she is diuresed about 3 L. Continue strict I's and O's and daily weights. Discontinue Norvasc. Continue to monitor and replete electrolytes as needed. Her estimated dry weight is around 130 kg, but her weight continues to be persistently 132 kg. Her basic metabolic panel this morning shows her creatinine is at baseline.  Essential hypertension: Continue metoprolol and losartan, blood pressure seems to be fairly controlled.. Discontinue Norvasc.  Hyperlipidemia: Continue statins.  Uncontrolled diabetes mellitus: Hold Metformin, continue Amaryl. Hemoglobin A1c is 8.2. Blood glucose fairly controlled, continue Amaryl long-acting insulin plus sliding scale.  Chronic respiratory failure with hypoxia due to underlying COPD: No wheezing on physical exam continue current home of oxygen. Remain afebrile with no leukocytosis.  Gout: Continue allopurinol.  DVT prophylaxis: lovenox Family Communication:none Status is: Observation  The patient will require care spanning > 2 midnights and should be moved to inpatient because: Hemodynamically unstable  Dispo: The patient is from: Home              Anticipated d/c is to: SNF              Anticipated d/c date is: 2 days              Patient currently is not medically stable to d/c.   Still requiring 3 L of oxygen to keep saturations greater than 96%.        Code Status:     Code Status Orders  (From admission, onward)         Start     Ordered   07/15/19 1229  Full code  Continuous        07/15/19 1229        Code Status History    Date Active Date Inactive Code Status Order ID Comments User Context   10/21/2018 0934 10/24/2018 2056 Full Code [id number]  Briant Cedarzenduka, Nkeiruka J, MD Inpatient   08/24/2015 0257 08/28/2015 1720 Full Code [id number]  Lorretta HarpNiu, Xilin, MD Inpatient   Advance Care Planning Activity        IV Access:    Peripheral IV   Procedures and diagnostic studies:   ECHOCARDIOGRAM COMPLETE  Result Date: 07/16/2019    ECHOCARDIOGRAM REPORT   Patient Name:   Morgan BeachJULIA Bauernfeind Date of Exam: 07/16/2019 Medical Rec #:  [id number]    Height:       63.0 in Accession #:    [id number]   Weight:       293.3 lb Date of Birth:  04/01/1950    BSA:          2.275 m Patient Age:    68 years     BP:           139/52 mmHg Patient Gender: F            HR:  82 bpm. Exam Location:  Inpatient Procedure: 2D Echo, Cardiac Doppler and Color Doppler Indications:    R07.9* Chest pain, unspecified  History:        Patient has prior history of Echocardiogram examinations, most                 recent 10/21/2018. CHF, COPD; Risk Factors:Hypertension,                 Diabetes and Dyslipidemia.  Sonographer:    Elmarie Shiley Dance Referring Phys: [id number] LAURA R INGOLD IMPRESSIONS  1. Severe intracavitary gradient. Peak velocity 4.67 m/s. Peak gradient 87.2 mmHg. Systolic anterior motion of the mitral valve. Findings consistent with HOCM. Left ventricular ejection fraction, by estimation, is 60 to 65%. The left ventricle has normal function. The left ventricle has no regional wall motion abnormalities. There is severe concentric left ventricular hypertrophy. Left ventricular diastolic parameters are consistent with Grade II diastolic dysfunction (pseudonormalization).  2. Right ventricular  systolic function is normal. The right ventricular size is normal.  3. Left atrial size was mildly dilated.  4. The mitral valve is normal in structure. No evidence of mitral valve regurgitation. No evidence of mitral stenosis.  5. The aortic valve is normal in structure. Aortic valve regurgitation is not visualized. Moderate aortic valve stenosis. Aortic valve area, by VTI measures 1.08 cm. Aortic valve mean gradient measures 25.0 mmHg. Aortic valve Vmax measures 3.52 m/s.  6. Aortic dilatation noted. There is mild dilatation of the ascending aorta measuring 38 mm.  7. The inferior vena cava is dilated in size with >50% respiratory variability, suggesting right atrial pressure of 8 mmHg. FINDINGS  Left Ventricle: Severe intracavitary gradient. Peak velocity 4.67 m/s. Peak gradient 87.2 mmHg. Systolic anterior motion of the mitral valve. Findings consistent with HOCM. Left ventricular ejection fraction, by estimation, is 60 to 65%. The left ventricle has normal function. The left ventricle has no regional wall motion abnormalities. The left ventricular internal cavity size was normal in size. There is severe concentric left ventricular hypertrophy. Left ventricular diastolic parameters are consistent with Grade II diastolic dysfunction (pseudonormalization). Right Ventricle: The right ventricular size is normal. No increase in right ventricular wall thickness. Right ventricular systolic function is normal. Left Atrium: Left atrial size was mildly dilated. Right Atrium: Right atrial size was normal in size. Pericardium: There is no evidence of pericardial effusion. Mitral Valve: The mitral valve is normal in structure. Normal mobility of the mitral valve leaflets. Mild mitral annular calcification. No evidence of mitral valve regurgitation. No evidence of mitral valve stenosis. Tricuspid Valve: The tricuspid valve is normal in structure. Tricuspid valve regurgitation is trivial. No evidence of tricuspid stenosis.  Aortic Valve: The aortic valve is normal in structure. Aortic valve regurgitation is not visualized. Moderate aortic stenosis is present. Aortic valve mean gradient measures 25.0 mmHg. Aortic valve peak gradient measures 49.6 mmHg. Aortic valve area, by VTI measures 1.08 cm. Pulmonic Valve: The pulmonic valve was normal in structure. Pulmonic valve regurgitation is not visualized. No evidence of pulmonic stenosis. Aorta: Aortic dilatation noted. There is mild dilatation of the ascending aorta measuring 38 mm. Venous: The inferior vena cava is dilated in size with greater than 50% respiratory variability, suggesting right atrial pressure of 8 mmHg. IAS/Shunts: No atrial level shunt detected by color flow Doppler.  LEFT VENTRICLE PLAX 2D LVIDd:         5.20 cm  Diastology LVIDs:         2.57 cm  LV  e' lateral:   8.49 cm/s LV PW:         1.61 cm  LV E/e' lateral: 17.4 LV IVS:        1.97 cm LVOT diam:     1.80 cm LV SV:         92 LV SV Index:   40 LVOT Area:     2.54 cm  RIGHT VENTRICLE             IVC RV Basal diam:  2.20 cm     IVC diam: 2.75 cm RV S prime:     10.30 cm/s TAPSE (M-mode): 2.2 cm LEFT ATRIUM           Index       RIGHT ATRIUM           Index LA diam:      5.50 cm 2.42 cm/m  RA Area:     14.10 cm LA Vol (A2C): 51.0 ml 22.41 ml/m RA Volume:   32.50 ml  14.28 ml/m LA Vol (A4C): 74.0 ml 32.52 ml/m  AORTIC VALVE AV Area (Vmax):    1.13 cm AV Area (Vmean):   1.07 cm AV Area (VTI):     1.08 cm AV Vmax:           352.00 cm/s AV Vmean:          232.000 cm/s AV VTI:            0.846 m AV Peak Grad:      49.6 mmHg AV Mean Grad:      25.0 mmHg LVOT Vmax:         156.00 cm/s LVOT Vmean:        98.000 cm/s LVOT VTI:          0.360 m LVOT/AV VTI ratio: 0.43  AORTA Ao Root diam: 2.80 cm Ao Asc diam:  3.80 cm MITRAL VALVE MV Area (PHT): 3.60 cm     SHUNTS MV Decel Time: 211 msec     Systemic VTI:  0.36 m MV E velocity: 148.00 cm/s  Systemic Diam: 1.80 cm MV A velocity: 75.10 cm/s MV E/A ratio:  1.97 Chilton Si MD Electronically signed by Chilton Si MD Signature Date/Time: 07/16/2019/4:18:06 PM    Final      Medical Consultants:    None.  Anti-Infectives:   none  Subjective:    Shantavia Jha relates her breathing is better today than yesterday.  Objective:    Vitals:   07/17/19 2141 07/18/19 0125 07/18/19 0440 07/18/19 0844  BP:   (!) 106/44   Pulse: 68  68   Resp: 16  20   Temp:   98.7 F (37.1 C)   TempSrc:   Oral   SpO2: 96%  100% 100%  Weight:  132 kg    Height:       SpO2: 100 % O2 Flow Rate (L/min): 3 L/min FiO2 (%): 30 %   Intake/Output Summary (Last 24 hours) at 07/18/2019 1104 Last data filed at 07/18/2019 0545 Gross per 24 hour  Intake 942 ml  Output 1700 ml  Net -758 ml   Filed Weights   07/16/19 0019 07/17/19 0421 07/18/19 0125  Weight: 133 kg 133.2 kg 132 kg    Exam: General exam: In no acute distress. Respiratory system: Good air movement and clear to auscultation. Cardiovascular system: S1 & S2 heard, RRR. No JVD. Gastrointestinal system: Abdomen is nondistended, soft and nontender.  Extremities: No pedal edema. Skin:  No rashes, lesions or ulcers Psychiatry: Judgement and insight appear normal. Mood & affect appropriate.   Data Reviewed:    Labs: Basic Metabolic Panel: Recent Labs  Lab 07/14/19 1506 07/14/19 1506 07/15/19 1510 07/15/19 1510 07/16/19 0340 07/16/19 0340 07/17/19 0942 07/18/19 0452  NA 141  --  143  --  142  --  141 143  K 3.7   < > 3.7   < > 3.8   < > 4.4 4.5  CL 90*  --  94*  --  96*  --  97* 98  CO2 36*  --  36*  --  34*  --  32 36*  GLUCOSE 206*  --  118*  --  131*  --  295* 82  BUN 37*  --  30*  --  30*  --  27* 29*  CREATININE 0.96  --  0.81  --  0.86  --  1.01* 1.03*  CALCIUM 10.0  --  10.1  --  9.3  --  9.2 9.3  MG  --   --  2.4  --   --   --   --   --    < > = values in this interval not displayed.   GFR Estimated Creatinine Clearance: 67.6 mL/min (A) (by C-G formula based on SCr of 1.03  mg/dL (H)). Liver Function Tests: Recent Labs  Lab 07/15/19 1510 07/16/19 0340  AST 21 18  ALT 20 17  ALKPHOS 64 49  BILITOT 0.6 0.6  PROT 7.4 6.1*  ALBUMIN 4.1 3.2*   No results for input(s): LIPASE, AMYLASE in the last 168 hours. No results for input(s): AMMONIA in the last 168 hours. Coagulation profile No results for input(s): INR, PROTIME in the last 168 hours. COVID-19 Labs  Recent Labs    07/15/19 1510  DDIMER 0.43    Lab Results  Component Value Date   SARSCOV2NAA NEGATIVE 07/14/2019   SARSCOV2NAA NEGATIVE 10/20/2018    CBC: Recent Labs  Lab 07/14/19 1506 07/15/19 1510 07/16/19 0340 07/18/19 0452  WBC 6.1 5.7 5.0 5.3  HGB 10.8* 11.0* 8.9* 9.2*  HCT 36.6 37.5 30.5* 31.2*  MCV 90.6 89.9 90.2 91.2  PLT 168 161 126* 139*   Cardiac Enzymes: No results for input(s): CKTOTAL, CKMB, CKMBINDEX, TROPONINI in the last 168 hours. BNP (last 3 results) No results for input(s): PROBNP in the last 8760 hours. CBG: Recent Labs  Lab 07/17/19 1649 07/17/19 2153 07/18/19 0535 07/18/19 0611 07/18/19 0959  GLUCAP 183* 118* 80 117* 233*   D-Dimer: Recent Labs    07/15/19 1510  DDIMER 0.43   Hgb A1c: Recent Labs    07/15/19 1510  HGBA1C 8.2*   Lipid Profile: No results for input(s): CHOL, HDL, LDLCALC, TRIG, CHOLHDL, LDLDIRECT in the last 72 hours. Thyroid function studies: Recent Labs    07/15/19 1510  TSH 3.422   Anemia work up: No results for input(s): VITAMINB12, FOLATE, FERRITIN, TIBC, IRON, RETICCTPCT in the last 72 hours. Sepsis Labs: Recent Labs  Lab 07/14/19 1506 07/15/19 1510 07/16/19 0340 07/18/19 0452  WBC 6.1 5.7 5.0 5.3   Microbiology Recent Results (from the past 240 hour(s))  SARS Coronavirus 2 by RT PCR (hospital order, performed in [facility] hospital lab) Nasopharyngeal Nasopharyngeal Swab     Status: None   Collection Time: 07/14/19  8:40 PM   Specimen: Nasopharyngeal Swab  Result Value Ref Range Status   SARS  Coronavirus 2 NEGATIVE NEGATIVE Final    Comment: (NOTE)  SARS-CoV-2 target nucleic acids are NOT DETECTED.  The SARS-CoV-2 RNA is generally detectable in upper and lower respiratory specimens during the acute phase of infection. The lowest concentration of SARS-CoV-2 viral copies this assay can detect is 250 copies / mL. A negative result does not preclude SARS-CoV-2 infection and should not be used as the sole basis for treatment or other patient management decisions.  A negative result may occur with improper specimen collection / handling, submission of specimen other than nasopharyngeal swab, presence of viral mutation(s) within the areas targeted by this assay, and inadequate number of viral copies (<250 copies / mL). A negative result must be combined with clinical observations, patient history, and epidemiological information.  Fact Sheet for Patients:   BoilerBrush.com.cy  Fact Sheet for Healthcare Providers: https://pope.com/  This test is not yet approved or  cleared by the [place] FDA and has been authorized for detection and/or diagnosis of SARS-CoV-2 by FDA under an Emergency Use Authorization (EUA).  This EUA will remain in effect (meaning this test can be used) for the duration of the COVID-19 declaration under Section 564(b)(1) of the Act, 21 U.S.C. section 360bbb-3(b)(1), unless the authorization is terminated or revoked sooner.  Performed at [facility], [address] [place], [place] [address]      Medications:   . allopurinol  300 mg Oral Daily  . amLODipine  5 mg Oral Daily  . aspirin EC  81 mg Oral Daily  . enoxaparin (LOVENOX) injection  60 mg Subcutaneous Q24H  . ferrous sulfate  325 mg Oral Daily  . fluticasone furoate-vilanterol  1 puff Inhalation Daily  . insulin aspart  0-15 Units Subcutaneous TID WC  . insulin aspart  0-5 Units Subcutaneous QHS  . insulin aspart  4 Units  Subcutaneous TID WC  . insulin glargine  10 Units Subcutaneous BID  . losartan  100 mg Oral Daily  . metoprolol succinate  50 mg Oral Daily  . montelukast  10 mg Oral QHS  . potassium chloride  40 mEq Oral BID  . rosuvastatin  40 mg Oral Daily  . sodium chloride flush  3 mL Intravenous Once   Continuous Infusions: . furosemide 120 mg (07/18/19 0951)      LOS: 2 days   Marinda Elk  [facility]  07/18/2019, 11:04 AM

## 2019-07-18 NOTE — Progress Notes (Signed)
Nutrition Brief Note  RD consulted for diet education regarding a low sodium, CHF diet. RD working remotely. RD attempted to contact pt via inpatient room phone with no success. RD has attached diet handout "Low Sodium Nutrition Therapy" from the Academy of Nutrition and Dietetics Manual. RD contact information given.   Pt currently on a 2 gram sodium diet. Meal completion has been 100%. Labs and medications reviewed. No further nutrition interventions at this time. RD to follow up as able with diet education.  Roslyn Smiling, MS, RD, LDN RD pager number/after hours weekend pager number on Amion.

## 2019-07-18 NOTE — Progress Notes (Signed)
Progress Note  Patient Name: Morgan Fischer Date of Encounter: 07/18/2019  [facility] Cardiologist: Garwin Brothers, MD   Subjective   Feels much better, shoulder pain and upper lt chest pain has resolved.  Her SOB is improved. Now with support stockings as well.  Inpatient Medications    Scheduled Meds: . allopurinol  300 mg Oral Daily  . amLODipine  5 mg Oral Daily  . aspirin EC  81 mg Oral Daily  . enoxaparin (LOVENOX) injection  60 mg Subcutaneous Q24H  . ferrous sulfate  325 mg Oral Daily  . fluticasone furoate-vilanterol  1 puff Inhalation Daily  . insulin aspart  0-15 Units Subcutaneous TID WC  . insulin aspart  0-5 Units Subcutaneous QHS  . insulin aspart  4 Units Subcutaneous TID WC  . insulin glargine  10 Units Subcutaneous BID  . losartan  100 mg Oral Daily  . metoprolol succinate  50 mg Oral Daily  . montelukast  10 mg Oral QHS  . potassium chloride  40 mEq Oral BID  . rosuvastatin  40 mg Oral Daily  . sodium chloride flush  3 mL Intravenous Once   Continuous Infusions: . furosemide     PRN Meds: acetaminophen **OR** acetaminophen, albuterol, ipratropium-albuterol, morphine injection, nitroGLYCERIN, ondansetron **OR** ondansetron (ZOFRAN) IV   Vital Signs    Vitals:   07/17/19 2141 07/18/19 0125 07/18/19 0440 07/18/19 0844  BP:   (!) 106/44   Pulse: 68  68   Resp: 16  20   Temp:   98.7 F (37.1 C)   TempSrc:   Oral   SpO2: 96%  100% 100%  Weight:  132 kg    Height:        Intake/Output Summary (Last 24 hours) at 07/18/2019 0918 Last data filed at 07/18/2019 0545 Gross per 24 hour  Intake 942 ml  Output 1700 ml  Net -758 ml   Last 3 Weights 07/18/2019 07/17/2019 07/16/2019  Weight (lbs) 291 lb 293 lb 9.6 oz 293 lb 4.8 oz  Weight (kg) 131.997 kg 133.176 kg 133.04 kg      Telemetry    SR with rare PVC - Personally Reviewed  ECG    No new - Personally Reviewed  Physical Exam   GEN: No acute distress.   Neck: No JVD sitting up in  chair Cardiac: RRR, no murmurs, rubs, or gallops.  Respiratory: improved respirations, less tight  to auscultation bilaterally. GI: Soft, nontender, non-distended  MS: less edema; No deformity. Neuro:  Nonfocal  Psych: Normal affect   Labs    High Sensitivity Troponin:   Recent Labs  Lab 07/16/19 0340 07/16/19 1024 07/16/19 1551 07/16/19 2131 07/17/19 0444  TROPONINIHS 36* 31* 29* 33* 30*      Chemistry Recent Labs  Lab 07/15/19 1510 07/15/19 1510 07/16/19 0340 07/17/19 0942 07/18/19 0452  NA 143   < > 142 141 143  K 3.7   < > 3.8 4.4 4.5  CL 94*   < > 96* 97* 98  CO2 36*   < > 34* 32 36*  GLUCOSE 118*   < > 131* 295* 82  BUN 30*   < > 30* 27* 29*  CREATININE 0.81   < > 0.86 1.01* 1.03*  CALCIUM 10.1   < > 9.3 9.2 9.3  PROT 7.4  --  6.1*  --   --   ALBUMIN 4.1  --  3.2*  --   --   AST 21  --  18  --   --   ALT 20  --  17  --   --   ALKPHOS 64  --  49  --   --   BILITOT 0.6  --  0.6  --   --   GFRNONAA >60   < > >60 56* 55*  GFRAA >60   < > >60 >60 >60  ANIONGAP 13   < > 12 12 9    < > = values in this interval not displayed.     Hematology Recent Labs  Lab 07/15/19 1510 07/16/19 0340 07/18/19 0452  WBC 5.7 5.0 5.3  RBC 4.17 3.38* 3.42*  HGB 11.0* 8.9* 9.2*  HCT 37.5 30.5* 31.2*  MCV 89.9 90.2 91.2  MCH 26.4 26.3 26.9  MCHC 29.3* 29.2* 29.5*  RDW 16.4* 16.5* 16.5*  PLT 161 126* 139*    BNP Recent Labs  Lab 07/14/19 1506  BNP 329.2*     DDimer  Recent Labs  Lab 07/15/19 1510  DDIMER 0.43     Radiology    ECHOCARDIOGRAM COMPLETE  Result Date: 07/16/2019    ECHOCARDIOGRAM REPORT   Patient Name:   [name] Date of Exam: 07/16/2019 Medical Rec #:  07/18/2019    Height:       63.0 in Accession #:    [id number]   Weight:       293.3 lb Date of Birth:  01-09-1950    BSA:          2.275 m Patient Age:    68 years     BP:           139/52 mmHg Patient Gender: F            HR:           82 bpm. Exam Location:  Inpatient Procedure: 2D Echo,  Cardiac Doppler and Color Doppler Indications:    R07.9* Chest pain, unspecified  History:        Patient has prior history of Echocardiogram examinations, most                 recent 10/21/2018. CHF, COPD; Risk Factors:Hypertension,                 Diabetes and Dyslipidemia.  Sonographer:    10/23/2018 Dance Referring Phys: [id number] Ercel Pepitone R Tilmon Wisehart IMPRESSIONS  1. Severe intracavitary gradient. Peak velocity 4.67 m/s. Peak gradient 87.2 mmHg. Systolic anterior motion of the mitral valve. Findings consistent with HOCM. Left ventricular ejection fraction, by estimation, is 60 to 65%. The left ventricle has normal function. The left ventricle has no regional wall motion abnormalities. There is severe concentric left ventricular hypertrophy. Left ventricular diastolic parameters are consistent with Grade II diastolic dysfunction (pseudonormalization).  2. Right ventricular systolic function is normal. The right ventricular size is normal.  3. Left atrial size was mildly dilated.  4. The mitral valve is normal in structure. No evidence of mitral valve regurgitation. No evidence of mitral stenosis.  5. The aortic valve is normal in structure. Aortic valve regurgitation is not visualized. Moderate aortic valve stenosis. Aortic valve area, by VTI measures 1.08 cm. Aortic valve mean gradient measures 25.0 mmHg. Aortic valve Vmax measures 3.52 m/s.  6. Aortic dilatation noted. There is mild dilatation of the ascending aorta measuring 38 mm.  7. The inferior vena cava is dilated in size with >50% respiratory variability, suggesting right atrial pressure of 8 mmHg. FINDINGS  Left Ventricle: Severe intracavitary gradient. Peak velocity  4.67 m/s. Peak gradient 87.2 mmHg. Systolic anterior motion of the mitral valve. Findings consistent with HOCM. Left ventricular ejection fraction, by estimation, is 60 to 65%. The left ventricle has normal function. The left ventricle has no regional wall motion abnormalities. The left ventricular  internal cavity size was normal in size. There is severe concentric left ventricular hypertrophy. Left ventricular diastolic parameters are consistent with Grade II diastolic dysfunction (pseudonormalization). Right Ventricle: The right ventricular size is normal. No increase in right ventricular wall thickness. Right ventricular systolic function is normal. Left Atrium: Left atrial size was mildly dilated. Right Atrium: Right atrial size was normal in size. Pericardium: There is no evidence of pericardial effusion. Mitral Valve: The mitral valve is normal in structure. Normal mobility of the mitral valve leaflets. Mild mitral annular calcification. No evidence of mitral valve regurgitation. No evidence of mitral valve stenosis. Tricuspid Valve: The tricuspid valve is normal in structure. Tricuspid valve regurgitation is trivial. No evidence of tricuspid stenosis. Aortic Valve: The aortic valve is normal in structure. Aortic valve regurgitation is not visualized. Moderate aortic stenosis is present. Aortic valve mean gradient measures 25.0 mmHg. Aortic valve peak gradient measures 49.6 mmHg. Aortic valve area, by VTI measures 1.08 cm. Pulmonic Valve: The pulmonic valve was normal in structure. Pulmonic valve regurgitation is not visualized. No evidence of pulmonic stenosis. Aorta: Aortic dilatation noted. There is mild dilatation of the ascending aorta measuring 38 mm. Venous: The inferior vena cava is dilated in size with greater than 50% respiratory variability, suggesting right atrial pressure of 8 mmHg. IAS/Shunts: No atrial level shunt detected by color flow Doppler.  LEFT VENTRICLE PLAX 2D LVIDd:         5.20 cm  Diastology LVIDs:         2.57 cm  LV e' lateral:   8.49 cm/s LV PW:         1.61 cm  LV E/e' lateral: 17.4 LV IVS:        1.97 cm LVOT diam:     1.80 cm LV SV:         92 LV SV Index:   40 LVOT Area:     2.54 cm  RIGHT VENTRICLE             IVC RV Basal diam:  2.20 cm     IVC diam: 2.75 cm RV S  prime:     10.30 cm/s TAPSE (M-mode): 2.2 cm LEFT ATRIUM           Index       RIGHT ATRIUM           Index LA diam:      5.50 cm 2.42 cm/m  RA Area:     14.10 cm LA Vol (A2C): 51.0 ml 22.41 ml/m RA Volume:   32.50 ml  14.28 ml/m LA Vol (A4C): 74.0 ml 32.52 ml/m  AORTIC VALVE AV Area (Vmax):    1.13 cm AV Area (Vmean):   1.07 cm AV Area (VTI):     1.08 cm AV Vmax:           352.00 cm/s AV Vmean:          232.000 cm/s AV VTI:            0.846 m AV Peak Grad:      49.6 mmHg AV Mean Grad:      25.0 mmHg LVOT Vmax:         156.00 cm/s LVOT Vmean:  98.000 cm/s LVOT VTI:          0.360 m LVOT/AV VTI ratio: 0.43  AORTA Ao Root diam: 2.80 cm Ao Asc diam:  3.80 cm MITRAL VALVE MV Area (PHT): 3.60 cm     SHUNTS MV Decel Time: 211 msec     Systemic VTI:  0.36 m MV E velocity: 148.00 cm/s  Systemic Diam: 1.80 cm MV A velocity: 75.10 cm/s MV E/A ratio:  1.97 Chilton Si MD Electronically signed by Chilton Si MD Signature Date/Time: 07/16/2019/4:18:06 PM    Final     Cardiac Studies   ECHO 07/16/19  IMPRESSIONS    1. Severe intracavitary gradient. Peak velocity 4.67 m/s. Peak gradient  87.2 mmHg. Systolic anterior motion of the mitral valve. Findings  consistent with HOCM. Left ventricular ejection fraction, by estimation,  is 60 to 65%. The left ventricle has  normal function. The left ventricle has no regional wall motion  abnormalities. There is severe concentric left ventricular hypertrophy.  Left ventricular diastolic parameters are consistent with Grade II  diastolic dysfunction (pseudonormalization).  2. Right ventricular systolic function is normal. The right ventricular  size is normal.  3. Left atrial size was mildly dilated.  4. The mitral valve is normal in structure. No evidence of mitral valve  regurgitation. No evidence of mitral stenosis.  5. The aortic valve is normal in structure. Aortic valve regurgitation is  not visualized. Moderate aortic valve stenosis.  Aortic valve area, by VTI  measures 1.08 cm. Aortic valve mean gradient measures 25.0 mmHg. Aortic  valve Vmax measures 3.52 m/s.  6. Aortic dilatation noted. There is mild dilatation of the ascending  aorta measuring 38 mm.  7. The inferior vena cava is dilated in size with >50% respiratory  variability, suggesting right atrial pressure of 8 mmHg.   FINDINGS  Left Ventricle: Severe intracavitary gradient. Peak velocity 4.67 m/s.  Peak gradient 87.2 mmHg. Systolic anterior motion of the mitral valve.  Findings consistent with HOCM. Left ventricular ejection fraction, by  estimation, is 60 to 65%. The left  ventricle has normal function. The left ventricle has no regional wall  motion abnormalities. The left ventricular internal cavity size was normal  in size. There is severe concentric left ventricular hypertrophy. Left  ventricular diastolic parameters are  consistent with Grade II diastolic dysfunction (pseudonormalization).   Right Ventricle: The right ventricular size is normal. No increase in  right ventricular wall thickness. Right ventricular systolic function is  normal.   Left Atrium: Left atrial size was mildly dilated.   Right Atrium: Right atrial size was normal in size.   Pericardium: There is no evidence of pericardial effusion.   Mitral Valve: The mitral valve is normal in structure. Normal mobility of  the mitral valve leaflets. Mild mitral annular calcification. No evidence  of mitral valve regurgitation. No evidence of mitral valve stenosis.   Tricuspid Valve: The tricuspid valve is normal in structure. Tricuspid  valve regurgitation is trivial. No evidence of tricuspid stenosis.   Aortic Valve: The aortic valve is normal in structure. Aortic valve  regurgitation is not visualized. Moderate aortic stenosis is present.  Aortic valve mean gradient measures 25.0 mmHg. Aortic valve peak gradient  measures 49.6 mmHg. Aortic valve area, by  VTI measures 1.08  cm.   Pulmonic Valve: The pulmonic valve was normal in structure. Pulmonic valve  regurgitation is not visualized. No evidence of pulmonic stenosis.   Aorta: Aortic dilatation noted. There is mild dilatation of the ascending  aorta measuring 38 mm.   Venous: The inferior vena cava is dilated in size with greater than 50%  respiratory variability, suggesting right atrial pressure of 8 mmHg.   IAS/Shunts: No atrial level shunt detected by color flow Doppler.   Patient Profile     69 y.o. 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 Continue IV lasix 80mg  BID. Strict I &O. Watch renal function.  --neg [id number] since yesterday  -diuretic held for a day due to no IV access.wt down 1 Kg.  Decrease po fluids  --labsCr stable at 1.03  --she does weigh at home, discussed parameters and when to call.  Discussed decreased salt. Discussed fluid restriction.  2.Elevated troponin/Chest pain: -Will not start IV heparin. This does not seem to represent ACS. - Consider right and left heart cath as outpt with her primary cardilogist.(she may not be able to be flat or near flat for procedure.) - Continue ASA, Statin and BB - troponin all flat 36 to 30   3.HOCM: - Severe intracavitary gradient. Peak velocity 4.67 m/s. Peak gradient  87.2 mmHg. Systolic anterior motion of the mitral valve. Findings  consistent with HOCM.  Per Dr. [phone] :On my review she has severe concentric LVH with chordal SAM. This was better seen on her echo dated 10/21/2018. On that echo she had PG ~45 mmHG. She is now grossly volume overloaded and with similar chordal SAM on echo from yesterday. 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 On the current echo,  her LVOT gradients is 35-45 mmHG based on back calculation of the MR signal which is quite similar to the prior echo. It is not 87 mmHG as reported (incorrectly used the MR peak signal on CW). Clearly she has outflow tract obstruction, but appears to be chordal. AoV visually does not appears to have any stenosis on this echo and prior. No syncope, no FH of SCD, no VT on monitor. Will need MRI at some point to confirm. Given age (68 years), would not be concerned with ICD. HR int he 60s. No issues. Continue BB. Can follow with primary cardiologist. Clearly, we do not have absence of a secondary cause of her severe LVH, so HOCM is in question. Best sorted out in the outpatient setting.  Normal EF 60-65% G2DD   4. HTN: - BP stable on current medications  5. NSVT - she did felt palpitations - Continue BB on 50 mg daily  - Mg is 2.4 - K 3.8 (keep above 4 with diuresis)>> will supplement  6.  Moderate AS stable.   DM, COPD, OSA with CPAP per IM and anemia.          For questions or updates, please contact [facility] Please consult [email or web address] for contact info under        Signed, 10/23/2018, NP  07/18/2019, 9:18 AM

## 2019-07-19 ENCOUNTER — Other Ambulatory Visit: Payer: Self-pay

## 2019-07-19 DIAGNOSIS — I5031 Acute diastolic (congestive) heart failure: Secondary | ICD-10-CM

## 2019-07-19 DIAGNOSIS — I10 Essential (primary) hypertension: Secondary | ICD-10-CM

## 2019-07-19 LAB — BASIC METABOLIC PANEL
Anion gap: 11 (ref 5–15)
BUN: 28 mg/dL — ABNORMAL HIGH (ref 8–23)
CO2: 34 mmol/L — ABNORMAL HIGH (ref 22–32)
Calcium: 9.2 mg/dL (ref 8.9–10.3)
Chloride: 97 mmol/L — ABNORMAL LOW (ref 98–111)
Creatinine, Ser: 0.99 mg/dL (ref 0.44–1.00)
GFR calc Af Amer: >60 mL/min (ref >60)
GFR calc non Af Amer: 58 mL/min — ABNORMAL LOW (ref >60)
Glucose, Bld: 190 mg/dL — ABNORMAL HIGH (ref 70–99)
Potassium: 4.4 mmol/L (ref 3.5–5.1)
Sodium: 142 mmol/L (ref 135–145)

## 2019-07-19 LAB — GLUCOSE, CAPILLARY
Glucose-Capillary: 104 mg/dL — ABNORMAL HIGH (ref 70–99)
Glucose-Capillary: 178 mg/dL — ABNORMAL HIGH (ref 70–99)
Glucose-Capillary: 184 mg/dL — ABNORMAL HIGH (ref 70–99)
Glucose-Capillary: 192 mg/dL — ABNORMAL HIGH (ref 70–99)

## 2019-07-19 MED ORDER — FUROSEMIDE 10 MG/ML IJ SOLN
80.0000 mg | Freq: Two times a day (BID) | INTRAMUSCULAR | Status: DC
  Administered 2019-07-20: 80 mg via INTRAVENOUS
  Filled 2019-07-19: qty 8

## 2019-07-19 MED ORDER — METOPROLOL SUCCINATE ER 50 MG PO TB24
75.0000 mg | ORAL_TABLET | Freq: Every day | ORAL | Status: DC
  Administered 2019-07-20 – 2019-07-21 (×2): 75 mg via ORAL
  Filled 2019-07-19 (×2): qty 1

## 2019-07-19 MED ORDER — FUROSEMIDE 10 MG/ML IJ SOLN
100.0000 mg | Freq: Two times a day (BID) | INTRAVENOUS | Status: DC
  Administered 2019-07-19: 100 mg via INTRAVENOUS
  Filled 2019-07-19 (×2): qty 10

## 2019-07-19 NOTE — Progress Notes (Signed)
Replaced nasal cannula per patient request.

## 2019-07-19 NOTE — Progress Notes (Signed)
[facility] PROGRESS NOTE    Progress Note  Morgan Fischer  BPZ:[id number] DOB: 03-11-1950 DOA: 07/14/2019 PCP: Jackie Plum, MD     Brief Narrative:   Morgan Fischer is an 69 y.o. female past medical history significant for [place], chronic hypoxic respiratory failure secondary to underlying COPD on 3 L obstructive sleep apnea on BiPAP at night, morbid obesity, diabetes mellitus type 2 comes into the ED for 3 days of chest pain.  Assessment/Plan:   Exertional chest pain with shortness of breath: Troponins basically flat. Unlikely ACS likely due to acute diastolic heart failure in the setting of HOCM  Acute on chronic diastolic heart failure: Continue IV Lasix she is diuresed about 4.5L Continue strict I's and O's and daily weights. Continue to monitor and replete electrolytes as needed. Her estimated dry weight is around 130 kg. Further management per cardiology basic metabolic panel is pending this morning. Will need an cardiac MRI at some point.  Essential hypertension: Continue metoprolol and losartan, blood pressure seems to be fairly controlled.. Discontinue Norvasc.  Hyperlipidemia: Continue statins.  Uncontrolled diabetes mellitus: Hold Metformin, continue Amaryl. Hemoglobin A1c is 8.2. Good control of blood glucose continue long-acting insulin plus sliding scale.  Chronic respiratory failure with hypoxia due to underlying COPD: No wheezing on physical exam continue current home of oxygen. Remain afebrile with no leukocytosis.  Gout: Continue allopurinol.  Morbidly obese: Counseled.  DVT prophylaxis: lovenox Family Communication:none Status is: Observation  The patient will require care spanning > 2 midnights and should be moved to inpatient because: Hemodynamically unstable  Dispo: The patient is from: Home              Anticipated d/c is to: SNF              Anticipated d/c date is: 3 days              Patient currently is not medically  stable to d/c.  Still requiring 3 L of oxygen to keep saturations greater than 96%.        Code Status:     Code Status Orders  (From admission, onward)         Start     Ordered   07/15/19 1229  Full code  Continuous        07/15/19 1229        Code Status History    Date Active Date Inactive Code Status Order ID Comments User Context   10/21/2018 0934 10/24/2018 2056 Full Code [id number]  Briant Cedar, MD Inpatient   08/24/2015 0257 08/28/2015 1720 Full Code [id number]  Lorretta Harp, MD Inpatient   Advance Care Planning Activity        IV Access:    Peripheral IV   Procedures and diagnostic studies:   No results found.   Medical Consultants:    None.  Anti-Infectives:   none  Subjective:    Morgan Fischer she relates her breathing continues to improve.  Objective:    Vitals:   07/18/19 2020 07/19/19 0353 07/19/19 0722 07/19/19 0828  BP: 129/66 (!) 118/52 102/67   Pulse: 61 60 61   Resp: 17 16 18    Temp: 98.2 F (36.8 C) 98.2 F (36.8 C)    TempSrc: Oral Oral    SpO2: 100% 100% 100% 100%  Weight:  130.8 kg    Height:       SpO2: 100 % O2 Flow Rate (L/min): 3 L/min FiO2 (%): 32 %   Intake/Output Summary (  Last 24 hours) at 07/19/2019 1023 Last data filed at 07/19/2019 0900 Gross per 24 hour  Intake 1075 ml  Output 3600 ml  Net -2525 ml   Filed Weights   07/17/19 0421 07/18/19 0125 07/19/19 0353  Weight: 133.2 kg 132 kg 130.8 kg    Exam: General exam: In no acute distress, morbidly obese. Respiratory system: Good air movement and clear to auscultation. Cardiovascular system: S1 & S2 heard, RRR. No JVD. Gastrointestinal system: Abdomen is nondistended, soft and nontender.  Extremities: No pedal edema. Skin: No rashes, lesions or ulcers Psychiatry: Judgement and insight appear normal. Mood & affect appropriate..   Data Reviewed:    Labs: Basic Metabolic Panel: Recent Labs  Lab 07/14/19 1506 07/14/19 1506  07/15/19 1510 07/15/19 1510 07/16/19 0340 07/16/19 0340 07/17/19 0942 07/18/19 0452  NA 141  --  143  --  142  --  141 143  K 3.7   < > 3.7   < > 3.8   < > 4.4 4.5  CL 90*  --  94*  --  96*  --  97* 98  CO2 36*  --  36*  --  34*  --  32 36*  GLUCOSE 206*  --  118*  --  131*  --  295* 82  BUN 37*  --  30*  --  30*  --  27* 29*  CREATININE 0.96  --  0.81  --  0.86  --  1.01* 1.03*  CALCIUM 10.0  --  10.1  --  9.3  --  9.2 9.3  MG  --   --  2.4  --   --   --   --   --    < > = values in this interval not displayed.   GFR Estimated Creatinine Clearance: 67.2 mL/min (A) (by C-G formula based on SCr of 1.03 mg/dL (H)). Liver Function Tests: Recent Labs  Lab 07/15/19 1510 07/16/19 0340  AST 21 18  ALT 20 17  ALKPHOS 64 49  BILITOT 0.6 0.6  PROT 7.4 6.1*  ALBUMIN 4.1 3.2*   No results for input(s): LIPASE, AMYLASE in the last 168 hours. No results for input(s): AMMONIA in the last 168 hours. Coagulation profile No results for input(s): INR, PROTIME in the last 168 hours. COVID-19 Labs  No results for input(s): DDIMER, FERRITIN, LDH, CRP in the last 72 hours.  Lab Results  Component Value Date   SARSCOV2NAA NEGATIVE 07/14/2019   SARSCOV2NAA NEGATIVE 10/20/2018    CBC: Recent Labs  Lab 07/14/19 1506 07/15/19 1510 07/16/19 0340 07/18/19 0452  WBC 6.1 5.7 5.0 5.3  HGB 10.8* 11.0* 8.9* 9.2*  HCT 36.6 37.5 30.5* 31.2*  MCV 90.6 89.9 90.2 91.2  PLT 168 161 126* 139*   Cardiac Enzymes: No results for input(s): CKTOTAL, CKMB, CKMBINDEX, TROPONINI in the last 168 hours. BNP (last 3 results) No results for input(s): PROBNP in the last 8760 hours. CBG: Recent Labs  Lab 07/18/19 0959 07/18/19 1138 07/18/19 1629 07/18/19 2113 07/19/19 0610  GLUCAP 233* 172* 126* 148* 104*   D-Dimer: No results for input(s): DDIMER in the last 72 hours. Hgb A1c: No results for input(s): HGBA1C in the last 72 hours. Lipid Profile: No results for input(s): CHOL, HDL, LDLCALC,  TRIG, CHOLHDL, LDLDIRECT in the last 72 hours. Thyroid function studies: No results for input(s): TSH, T4TOTAL, T3FREE, THYROIDAB in the last 72 hours.  Invalid input(s): FREET3 Anemia work up: No results for input(s): VITAMINB12, FOLATE, FERRITIN,  TIBC, IRON, RETICCTPCT in the last 72 hours. Sepsis Labs: Recent Labs  Lab 07/14/19 1506 07/15/19 1510 07/16/19 0340 07/18/19 0452  WBC 6.1 5.7 5.0 5.3   Microbiology Recent Results (from the past 240 hour(s))  SARS Coronavirus 2 by RT PCR (hospital order, performed in [facility] hospital lab) Nasopharyngeal Nasopharyngeal Swab     Status: None   Collection Time: 07/14/19  8:40 PM   Specimen: Nasopharyngeal Swab  Result Value Ref Range Status   SARS Coronavirus 2 NEGATIVE NEGATIVE Final    Comment: (NOTE) SARS-CoV-2 target nucleic acids are NOT DETECTED.  The SARS-CoV-2 RNA is generally detectable in upper and lower respiratory specimens during the acute phase of infection. The lowest concentration of SARS-CoV-2 viral copies this assay can detect is 250 copies / mL. A negative result does not preclude SARS-CoV-2 infection and should not be used as the sole basis for treatment or other patient management decisions.  A negative result may occur with improper specimen collection / handling, submission of specimen other than nasopharyngeal swab, presence of viral mutation(s) within the areas targeted by this assay, and inadequate number of viral copies (<250 copies / mL). A negative result must be combined with clinical observations, patient history, and epidemiological information.  Fact Sheet for Patients:   BoilerBrush.com.cy  Fact Sheet for Healthcare Providers: https://pope.com/  This test is not yet approved or  cleared by the [place] FDA and has been authorized for detection and/or diagnosis of SARS-CoV-2 by FDA under an Emergency Use Authorization (EUA).  This EUA will  remain in effect (meaning this test can be used) for the duration of the COVID-19 declaration under Section 564(b)(1) of the Act, 21 U.S.C. section 360bbb-3(b)(1), unless the authorization is terminated or revoked sooner.  Performed at [facility], [address] [place], [place] [address]      Medications:   . allopurinol  300 mg Oral Daily  . aspirin EC  81 mg Oral Daily  . enoxaparin (LOVENOX) injection  60 mg Subcutaneous Q24H  . ferrous sulfate  325 mg Oral Daily  . fluticasone furoate-vilanterol  1 puff Inhalation Daily  . insulin aspart  0-15 Units Subcutaneous TID WC  . insulin aspart  0-5 Units Subcutaneous QHS  . insulin aspart  4 Units Subcutaneous TID WC  . insulin glargine  10 Units Subcutaneous BID  . losartan  100 mg Oral Daily  . metoprolol succinate  50 mg Oral Daily  . montelukast  10 mg Oral QHS  . potassium chloride  40 mEq Oral BID  . rosuvastatin  40 mg Oral Daily  . sodium chloride flush  3 mL Intravenous Once   Continuous Infusions: . furosemide 120 mg (07/19/19 0815)      LOS: 3 days   Marinda Elk  [facility]  07/19/2019, 10:23 AM

## 2019-07-19 NOTE — Progress Notes (Signed)
[facility] notified of transient mild left arm pain described as similar to the pain felt prior to admission. EKG obtained. Patient currently denies pain. Will monitor for reoccurrence and/or worsening pain.

## 2019-07-19 NOTE — Progress Notes (Signed)
Progress Note  Patient Name: Morgan Fischer Date of Encounter: 07/19/2019  [name] Cardiologist: Garwin Brothers, MD   Subjective   Feeling well, good response to increased lasix dose.   Her SOB is improved. Now with support stockings as well.  Inpatient Medications    Scheduled Meds: . allopurinol  300 mg Oral Daily  . aspirin EC  81 mg Oral Daily  . enoxaparin (LOVENOX) injection  60 mg Subcutaneous Q24H  . ferrous sulfate  325 mg Oral Daily  . fluticasone furoate-vilanterol  1 puff Inhalation Daily  . insulin aspart  0-15 Units Subcutaneous TID WC  . insulin aspart  0-5 Units Subcutaneous QHS  . insulin aspart  4 Units Subcutaneous TID WC  . insulin glargine  10 Units Subcutaneous BID  . losartan  100 mg Oral Daily  . metoprolol succinate  50 mg Oral Daily  . montelukast  10 mg Oral QHS  . potassium chloride  40 mEq Oral BID  . rosuvastatin  40 mg Oral Daily  . sodium chloride flush  3 mL Intravenous Once   Continuous Infusions: . furosemide 120 mg (07/19/19 0815)   PRN Meds: acetaminophen **OR** acetaminophen, albuterol, ipratropium-albuterol, morphine injection, nitroGLYCERIN, ondansetron **OR** ondansetron (ZOFRAN) IV   Vital Signs    Vitals:   07/18/19 2020 07/19/19 0353 07/19/19 0722 07/19/19 0828  BP: 129/66 (!) 118/52 102/67   Pulse: 61 60 61   Resp: 17 16 18    Temp: 98.2 F (36.8 C) 98.2 F (36.8 C)    TempSrc: Oral Oral    SpO2: 100% 100% 100% 100%  Weight:  130.8 kg    Height:        Intake/Output Summary (Last 24 hours) at 07/19/2019 0923 Last data filed at 07/19/2019 07/21/2019 Gross per 24 hour  Intake 885 ml  Output 3000 ml  Net -2115 ml   Last 3 Weights 07/19/2019 07/18/2019 07/17/2019  Weight (lbs) 288 lb 4.8 oz 291 lb 293 lb 9.6 oz  Weight (kg) 130.772 kg 131.997 kg 133.176 kg      Telemetry    SR with rare PVC - Personally Reviewed  ECG    No new - Personally Reviewed  Physical Exam   GEN: No acute distress.   Neck: No JVD  sitting up in chair Cardiac: RRR, no murmurs, rubs, or gallops.  Respiratory: improved respirations, decreased BS GI: Soft, nontender, non-distended  MS: less edema; No deformity. Neuro:  Nonfocal  Psych: Normal affect   Labs    High Sensitivity Troponin:   Recent Labs  Lab 07/16/19 0340 07/16/19 1024 07/16/19 1551 07/16/19 2131 07/17/19 0444  TROPONINIHS 36* 31* 29* 33* 30*      Chemistry Recent Labs  Lab 07/15/19 1510 07/15/19 1510 07/16/19 0340 07/17/19 0942 07/18/19 0452  NA 143   < > 142 141 143  K 3.7   < > 3.8 4.4 4.5  CL 94*   < > 96* 97* 98  CO2 36*   < > 34* 32 36*  GLUCOSE 118*   < > 131* 295* 82  BUN 30*   < > 30* 27* 29*  CREATININE 0.81   < > 0.86 1.01* 1.03*  CALCIUM 10.1   < > 9.3 9.2 9.3  PROT 7.4  --  6.1*  --   --   ALBUMIN 4.1  --  3.2*  --   --   AST 21  --  18  --   --   ALT 20  --  17  --   --   ALKPHOS 64  --  49  --   --   BILITOT 0.6  --  0.6  --   --   GFRNONAA >60   < > >60 56* 55*  GFRAA >60   < > >60 >60 >60  ANIONGAP 13   < > 12 12 9    < > = values in this interval not displayed.     Hematology Recent Labs  Lab 07/15/19 1510 07/16/19 0340 07/18/19 0452  WBC 5.7 5.0 5.3  RBC 4.17 3.38* 3.42*  HGB 11.0* 8.9* 9.2*  HCT 37.5 30.5* 31.2*  MCV 89.9 90.2 91.2  MCH 26.4 26.3 26.9  MCHC 29.3* 29.2* 29.5*  RDW 16.4* 16.5* 16.5*  PLT 161 126* 139*    BNP Recent Labs  Lab 07/14/19 1506  BNP 329.2*     DDimer  Recent Labs  Lab 07/15/19 1510  DDIMER 0.43     Radiology    No results found.  Cardiac Studies   ECHO 07/16/19  IMPRESSIONS    1. Severe intracavitary gradient. Peak velocity 4.67 m/s. Peak gradient  87.2 mmHg. Systolic anterior motion of the mitral valve. Findings  consistent with HOCM. Left ventricular ejection fraction, by estimation,  is 60 to 65%. The left ventricle has  normal function. The left ventricle has no regional wall motion  abnormalities. There is severe concentric left  ventricular hypertrophy.  Left ventricular diastolic parameters are consistent with Grade II  diastolic dysfunction (pseudonormalization).  2. Right ventricular systolic function is normal. The right ventricular  size is normal.  3. Left atrial size was mildly dilated.  4. The mitral valve is normal in structure. No evidence of mitral valve  regurgitation. No evidence of mitral stenosis.  5. The aortic valve is normal in structure. Aortic valve regurgitation is  not visualized. Moderate aortic valve stenosis. Aortic valve area, by VTI  measures 1.08 cm. Aortic valve mean gradient measures 25.0 mmHg. Aortic  valve Vmax measures 3.52 m/s.  6. Aortic dilatation noted. There is mild dilatation of the ascending  aorta measuring 38 mm.  7. The inferior vena cava is dilated in size with >50% respiratory  variability, suggesting right atrial pressure of 8 mmHg.   FINDINGS  Left Ventricle: Severe intracavitary gradient. Peak velocity 4.67 m/s.  Peak gradient 87.2 mmHg. Systolic anterior motion of the mitral valve.  Findings consistent with HOCM. Left ventricular ejection fraction, by  estimation, is 60 to 65%. The left  ventricle has normal function. The left ventricle has no regional wall  motion abnormalities. The left ventricular internal cavity size was normal  in size. There is severe concentric left ventricular hypertrophy. Left  ventricular diastolic parameters are  consistent with Grade II diastolic dysfunction (pseudonormalization).   Right Ventricle: The right ventricular size is normal. No increase in  right ventricular wall thickness. Right ventricular systolic function is  normal.   Left Atrium: Left atrial size was mildly dilated.   Right Atrium: Right atrial size was normal in size.   Pericardium: There is no evidence of pericardial effusion.   Mitral Valve: The mitral valve is normal in structure. Normal mobility of  the mitral valve leaflets. Mild mitral annular  calcification. No evidence  of mitral valve regurgitation. No evidence of mitral valve stenosis.   Tricuspid Valve: The tricuspid valve is normal in structure. Tricuspid  valve regurgitation is trivial. No evidence of tricuspid stenosis.   Aortic Valve: The aortic valve is normal  in structure. Aortic valve  regurgitation is not visualized. Moderate aortic stenosis is present.  Aortic valve mean gradient measures 25.0 mmHg. Aortic valve peak gradient  measures 49.6 mmHg. Aortic valve area, by  VTI measures 1.08 cm.   Pulmonic Valve: The pulmonic valve was normal in structure. Pulmonic valve  regurgitation is not visualized. No evidence of pulmonic stenosis.   Aorta: Aortic dilatation noted. There is mild dilatation of the ascending  aorta measuring 38 mm.   Venous: The inferior vena cava is dilated in size with greater than 50%  respiratory variability, suggesting right atrial pressure of 8 mmHg.   IAS/Shunts: No atrial level shunt detected by color flow Doppler.   Patient Profile     69 y.o. 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  Strict I &O. Renal function is stable.   --neg 1635 since yesterday  -diuretic held for a day due to no IV access.wt down 2 Kg.  Decrease po fluids  --labsCr stable at 1.03  --low sodium diet  2.Elevated troponin/Chest pain: -not consistent with ACS. Can evaluate further as outpatient.  - Continue ASA, Statin and BB - troponin all flat 36 to 30   3.HOCM: -   Per Dr. Flora Lipps :On my review she has severe concentric LVH with chordal SAM. This was better seen on her echo dated 10/21/2018. On that echo she had PG ~45 mmHG. She is now grossly volume overloaded and with similar chordal SAM on echo from yesterday. 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 On the current echo, her LVOT gradients is 35-45 mmHG based on back calculation of the MR signal which is quite similar to the prior echo. It is not 87 mmHG as reported (incorrectly used the MR peak signal on CW). Clearly she has outflow tract obstruction, but appears to be chordal. AoV visually does not appears to have any stenosis on this echo and prior. No syncope, no FH of SCD, no VT on monitor. Will need MRI at some point to confirm. Given age (68 years), would not be concerned with ICD. HR int he 60s.  Continue BB. Can follow with primary cardiologist. Clearly, we do not have absence of a secondary cause of her severe LVH, so HOCM is in question. Best sorted out in the outpatient setting.  Normal EF 60-65% G2DD   4. HTN: - BP stable on current medications  5. NSVT - she did felt palpitations - Continue BB on 50 mg daily  - Mg is 2.4 - K 4.5 with supplement   DM, COPD, OSA with CPAP per IM and anemia.          For questions or updates, please contact [facility] Please consult [email or web address] for contact info under        Signed, Cray Monnin [place], MD  07/19/2019, 9:23 AM

## 2019-07-20 DIAGNOSIS — J9611 Chronic respiratory failure with hypoxia: Secondary | ICD-10-CM

## 2019-07-20 LAB — GLUCOSE, CAPILLARY
Glucose-Capillary: 126 mg/dL — ABNORMAL HIGH (ref 70–99)
Glucose-Capillary: 168 mg/dL — ABNORMAL HIGH (ref 70–99)
Glucose-Capillary: 170 mg/dL — ABNORMAL HIGH (ref 70–99)
Glucose-Capillary: 223 mg/dL — ABNORMAL HIGH (ref 70–99)

## 2019-07-20 LAB — BASIC METABOLIC PANEL
Anion gap: 11 (ref 5–15)
BUN: 35 mg/dL — ABNORMAL HIGH (ref 8–23)
CO2: 33 mmol/L — ABNORMAL HIGH (ref 22–32)
Calcium: 9.3 mg/dL (ref 8.9–10.3)
Chloride: 100 mmol/L (ref 98–111)
Creatinine, Ser: 0.99 mg/dL (ref 0.44–1.00)
GFR calc Af Amer: >60 mL/min (ref >60)
GFR calc non Af Amer: 58 mL/min — ABNORMAL LOW (ref >60)
Glucose, Bld: 120 mg/dL — ABNORMAL HIGH (ref 70–99)
Potassium: 4.4 mmol/L (ref 3.5–5.1)
Sodium: 144 mmol/L (ref 135–145)

## 2019-07-20 MED ORDER — FUROSEMIDE 80 MG PO TABS
120.0000 mg | ORAL_TABLET | Freq: Two times a day (BID) | ORAL | Status: DC
  Administered 2019-07-20 – 2019-07-21 (×2): 120 mg via ORAL
  Filled 2019-07-20 (×2): qty 1

## 2019-07-20 NOTE — Progress Notes (Signed)
Pt placed on dreamstation at this time with 3LPM bleed in.  Pt comfortable and VS good. RT will continue to monitor.

## 2019-07-20 NOTE — Progress Notes (Signed)
[facility] PROGRESS NOTE    Progress Note  Morgan Fischer  WCH:[id number] DOB: May 13, 1950 DOA: 07/14/2019 PCP: Jackie Plum, MD     Brief Narrative:   Morgan Fischer is an 69 y.o. female past medical history significant for [place], chronic hypoxic respiratory failure secondary to underlying COPD on 3 L obstructive sleep apnea on BiPAP at night, morbid obesity, diabetes mellitus type 2 comes into the ED for 3 days of chest pain.  Assessment/Plan:   Exertional chest pain with shortness of breath: Troponins basically flat. Unlikely ACS likely due to acute diastolic heart failure in the setting of HOCM  Acute on chronic diastolic heart failure: She is now negative about 5 L and a half continue IV Lasix. Continue strict I's and O's and daily weights monitor electrolytes and replete as needed. Her weight has not changed significantly, will continue current dose of diuretic as she does have hypertrophic obstructive cardiomyopathy. Further management per cardiology basic metabolic panel is pending this morning. Will need an cardiac MRI at some point.  Essential hypertension: Blood pressure is relatively controlled on metoprolol losartan and diuretic. Continue current regimen.  Hyperlipidemia: Continue statins.  Uncontrolled diabetes mellitus: Hold Metformin. Continue long-acting sling per sliding scale blood glucose fairly controlled. Hemoglobin A1c is 8.2.  Chronic respiratory failure with hypoxia due to underlying COPD: No wheezing on physical exam continue current home of oxygen. Remain afebrile with no leukocytosis.  Gout: Continue allopurinol.  Morbidly obese: Counseled.  DVT prophylaxis: lovenox Family Communication:none Status is: Observation  The patient will require care spanning > 2 midnights and should be moved to inpatient because: Hemodynamically unstable  Dispo: The patient is from: Home              Anticipated d/c is to: SNF               Anticipated d/c date is: 3 days              Patient currently is not medically stable to d/c.  Still requiring 3 L of oxygen to keep saturations greater than 96%.   Code Status:     Code Status Orders  (From admission, onward)         Start     Ordered   07/15/19 1229  Full code  Continuous        07/15/19 1229        Code Status History    Date Active Date Inactive Code Status Order ID Comments User Context   10/21/2018 0934 10/24/2018 2056 Full Code [id number]  Briant Cedar, MD Inpatient   08/24/2015 0257 08/28/2015 1720 Full Code [id number]  Lorretta Harp, MD Inpatient   Advance Care Planning Activity        IV Access:    Peripheral IV   Procedures and diagnostic studies:   No results found.   Medical Consultants:    None.  Anti-Infectives:   none  Subjective:    Morgan Fischer no new complaints.  Objective:    Vitals:   07/20/19 0000 07/20/19 0513 07/20/19 0518 07/20/19 0836  BP: 106/62 (!) 151/72    Pulse: (!) 55 63    Resp: 18 17    Temp: 98.1 F (36.7 C) 97.9 F (36.6 C)    TempSrc: Oral Oral    SpO2: 100% 100%  96%  Weight:   130.4 kg   Height:       SpO2: 96 % O2 Flow Rate (L/min): 3 L/min FiO2 (%): 32 %  Intake/Output Summary (Last 24 hours) at 07/20/2019 1015 Last data filed at 07/20/2019 0744 Gross per 24 hour  Intake 720 ml  Output 2300 ml  Net -1580 ml   Filed Weights   07/18/19 0125 07/19/19 0353 07/20/19 0518  Weight: 132 kg 130.8 kg 130.4 kg    Exam: General exam: In no acute distress. Respiratory system: Good air movement and clear to auscultation. Cardiovascular system: S1 & S2 heard, RRR. No JVD. Gastrointestinal system: Abdomen is nondistended, soft and nontender.  Extremities: No pedal edema. Skin: No rashes, lesions or ulcers Psychiatry: Judgement and insight appear normal. Mood & affect appropriate.   Data Reviewed:    Labs: Basic Metabolic Panel: Recent Labs  Lab 07/15/19 1510 07/15/19 1510  07/16/19 0340 07/16/19 0340 07/17/19 7169 07/17/19 0942 07/18/19 0452 07/18/19 0452 07/19/19 0832 07/20/19 0612  NA 143   < > 142  --  141  --  143  --  142 144  K 3.7   < > 3.8   < > 4.4   < > 4.5   < > 4.4 4.4  CL 94*   < > 96*  --  97*  --  98  --  97* 100  CO2 36*   < > 34*  --  32  --  36*  --  34* 33*  GLUCOSE 118*   < > 131*  --  295*  --  82  --  190* 120*  BUN 30*   < > 30*  --  27*  --  29*  --  28* 35*  CREATININE 0.81   < > 0.86  --  1.01*  --  1.03*  --  0.99 0.99  CALCIUM 10.1   < > 9.3  --  9.2  --  9.3  --  9.2 9.3  MG 2.4  --   --   --   --   --   --   --   --   --    < > = values in this interval not displayed.   GFR Estimated Creatinine Clearance: 69.8 mL/min (by C-G formula based on SCr of 0.99 mg/dL). Liver Function Tests: Recent Labs  Lab 07/15/19 1510 07/16/19 0340  AST 21 18  ALT 20 17  ALKPHOS 64 49  BILITOT 0.6 0.6  PROT 7.4 6.1*  ALBUMIN 4.1 3.2*   No results for input(s): LIPASE, AMYLASE in the last 168 hours. No results for input(s): AMMONIA in the last 168 hours. Coagulation profile No results for input(s): INR, PROTIME in the last 168 hours. COVID-19 Labs  No results for input(s): DDIMER, FERRITIN, LDH, CRP in the last 72 hours.  Lab Results  Component Value Date   SARSCOV2NAA NEGATIVE 07/14/2019   SARSCOV2NAA NEGATIVE 10/20/2018    CBC: Recent Labs  Lab 07/14/19 1506 07/15/19 1510 07/16/19 0340 07/18/19 0452  WBC 6.1 5.7 5.0 5.3  HGB 10.8* 11.0* 8.9* 9.2*  HCT 36.6 37.5 30.5* 31.2*  MCV 90.6 89.9 90.2 91.2  PLT 168 161 126* 139*   Cardiac Enzymes: No results for input(s): CKTOTAL, CKMB, CKMBINDEX, TROPONINI in the last 168 hours. BNP (last 3 results) No results for input(s): PROBNP in the last 8760 hours. CBG: Recent Labs  Lab 07/19/19 0610 07/19/19 1134 07/19/19 1655 07/19/19 2107 07/20/19 0601  GLUCAP 104* 178* 184* 192* 126*   D-Dimer: No results for input(s): DDIMER in the last 72 hours. Hgb A1c: No  results for input(s): HGBA1C in the last 72  hours. Lipid Profile: No results for input(s): CHOL, HDL, LDLCALC, TRIG, CHOLHDL, LDLDIRECT in the last 72 hours. Thyroid function studies: No results for input(s): TSH, T4TOTAL, T3FREE, THYROIDAB in the last 72 hours.  Invalid input(s): FREET3 Anemia work up: No results for input(s): VITAMINB12, FOLATE, FERRITIN, TIBC, IRON, RETICCTPCT in the last 72 hours. Sepsis Labs: Recent Labs  Lab 07/14/19 1506 07/15/19 1510 07/16/19 0340 07/18/19 0452  WBC 6.1 5.7 5.0 5.3   Microbiology Recent Results (from the past 240 hour(s))  SARS Coronavirus 2 by RT PCR (hospital order, performed in [address] [facility] hospital lab) Nasopharyngeal Nasopharyngeal Swab     Status: None   Collection Time: 07/14/19  8:40 PM   Specimen: Nasopharyngeal Swab  Result Value Ref Range Status   SARS Coronavirus 2 NEGATIVE NEGATIVE Final    Comment: (NOTE) SARS-CoV-2 target nucleic acids are NOT DETECTED.  The SARS-CoV-2 RNA is generally detectable in upper and lower respiratory specimens during the acute phase of infection. The lowest concentration of SARS-CoV-2 viral copies this assay can detect is 250 copies / mL. A negative result does not preclude SARS-CoV-2 infection and should not be used as the sole basis for treatment or other patient management decisions.  A negative result may occur with improper specimen collection / handling, submission of specimen other than nasopharyngeal swab, presence of viral mutation(s) within the areas targeted by this assay, and inadequate number of viral copies (<250 copies / mL). A negative result must be combined with clinical observations, patient history, and epidemiological information.  Fact Sheet for Patients:   BoilerBrush.com.cy  Fact Sheet for Healthcare Providers: https://pope.com/  This test is not yet approved or  cleared by the [place] FDA and has been authorized  for detection and/or diagnosis of SARS-CoV-2 by FDA under an Emergency Use Authorization (EUA).  This EUA will remain in effect (meaning this test can be used) for the duration of the COVID-19 declaration under Section 564(b)(1) of the Act, 21 U.S.C. section 360bbb-3(b)(1), unless the authorization is terminated or revoked sooner.  Performed at [facility], [address] [place], [place] [address]      Medications:   . allopurinol  300 mg Oral Daily  . aspirin EC  81 mg Oral Daily  . enoxaparin (LOVENOX) injection  60 mg Subcutaneous Q24H  . ferrous sulfate  325 mg Oral Daily  . fluticasone furoate-vilanterol  1 puff Inhalation Daily  . furosemide  80 mg Intravenous BID  . insulin aspart  0-15 Units Subcutaneous TID WC  . insulin aspart  0-5 Units Subcutaneous QHS  . insulin aspart  4 Units Subcutaneous TID WC  . insulin glargine  10 Units Subcutaneous BID  . losartan  100 mg Oral Daily  . metoprolol succinate  75 mg Oral Daily  . montelukast  10 mg Oral QHS  . potassium chloride  40 mEq Oral BID  . rosuvastatin  40 mg Oral Daily  . sodium chloride flush  3 mL Intravenous Once   Continuous Infusions:     LOS: 4 days   Marinda Elk  [facility]  07/20/2019, 10:15 AM

## 2019-07-20 NOTE — Progress Notes (Signed)
Progress Note  Patient Name: Morgan Fischer Date of Encounter: 07/20/2019  [facility] Cardiologist: Garwin Brothers, MD   Subjective   Feeling well, good response to increased lasix dose.   Her SOB is improved. Now with support stockings as well. Weight is back to her dry weight.   Inpatient Medications    Scheduled Meds: . allopurinol  300 mg Oral Daily  . aspirin EC  81 mg Oral Daily  . enoxaparin (LOVENOX) injection  60 mg Subcutaneous Q24H  . ferrous sulfate  325 mg Oral Daily  . fluticasone furoate-vilanterol  1 puff Inhalation Daily  . furosemide  120 mg Oral BID  . insulin aspart  0-15 Units Subcutaneous TID WC  . insulin aspart  0-5 Units Subcutaneous QHS  . insulin aspart  4 Units Subcutaneous TID WC  . insulin glargine  10 Units Subcutaneous BID  . losartan  100 mg Oral Daily  . metoprolol succinate  75 mg Oral Daily  . montelukast  10 mg Oral QHS  . potassium chloride  40 mEq Oral BID  . rosuvastatin  40 mg Oral Daily  . sodium chloride flush  3 mL Intravenous Once   Continuous Infusions:  PRN Meds: acetaminophen **OR** acetaminophen, albuterol, ipratropium-albuterol, morphine injection, nitroGLYCERIN, ondansetron **OR** ondansetron (ZOFRAN) IV   Vital Signs    Vitals:   07/20/19 0000 07/20/19 0513 07/20/19 0518 07/20/19 0836  BP: 106/62 (!) 151/72    Pulse: (!) 55 63    Resp: 18 17    Temp: 98.1 F (36.7 C) 97.9 F (36.6 C)    TempSrc: Oral Oral    SpO2: 100% 100%  96%  Weight:   130.4 kg   Height:        Intake/Output Summary (Last 24 hours) at 07/20/2019 1134 Last data filed at 07/20/2019 0744 Gross per 24 hour  Intake 720 ml  Output 1800 ml  Net -1080 ml   Last 3 Weights 07/20/2019 07/19/2019 07/18/2019  Weight (lbs) 287 lb 6.4 oz 288 lb 4.8 oz 291 lb  Weight (kg) 130.364 kg 130.772 kg 131.997 kg      Telemetry    SR with rare PVC - Personally Reviewed  ECG    No new - Personally Reviewed  Physical Exam   GEN: No acute  distress.   Neck: No JVD sitting up in chair Cardiac: RRR, no murmurs, rubs, or gallops.  Respiratory: improved respirations, decreased BS GI: Soft, nontender, non-distended  MS: less edema; No deformity. Neuro:  Nonfocal  Psych: Normal affect   Labs    High Sensitivity Troponin:   Recent Labs  Lab 07/16/19 0340 07/16/19 1024 07/16/19 1551 07/16/19 2131 07/17/19 0444  TROPONINIHS 36* 31* 29* 33* 30*      Chemistry Recent Labs  Lab 07/15/19 1510 07/15/19 1510 07/16/19 0340 07/17/19 0942 07/18/19 0452 07/19/19 0832 07/20/19 0612  NA 143   < > 142   < > 143 142 144  K 3.7   < > 3.8   < > 4.5 4.4 4.4  CL 94*   < > 96*   < > 98 97* 100  CO2 36*   < > 34*   < > 36* 34* 33*  GLUCOSE 118*   < > 131*   < > 82 190* 120*  BUN 30*   < > 30*   < > 29* 28* 35*  CREATININE 0.81   < > 0.86   < > 1.03* 0.99 0.99  CALCIUM 10.1   < >  9.3   < > 9.3 9.2 9.3  PROT 7.4  --  6.1*  --   --   --   --   ALBUMIN 4.1  --  3.2*  --   --   --   --   AST 21  --  18  --   --   --   --   ALT 20  --  17  --   --   --   --   ALKPHOS 64  --  49  --   --   --   --   BILITOT 0.6  --  0.6  --   --   --   --   GFRNONAA >60   < > >60   < > 55* 58* 58*  GFRAA >60   < > >60   < > >60 >60 >60  ANIONGAP 13   < > 12   < > 9 11 11    < > = values in this interval not displayed.     Hematology Recent Labs  Lab 07/15/19 1510 07/16/19 0340 07/18/19 0452  WBC 5.7 5.0 5.3  RBC 4.17 3.38* 3.42*  HGB 11.0* 8.9* 9.2*  HCT 37.5 30.5* 31.2*  MCV 89.9 90.2 91.2  MCH 26.4 26.3 26.9  MCHC 29.3* 29.2* 29.5*  RDW 16.4* 16.5* 16.5*  PLT 161 126* 139*    BNP Recent Labs  Lab 07/14/19 1506  BNP 329.2*     DDimer  Recent Labs  Lab 07/15/19 1510  DDIMER 0.43     Radiology    No results found.  Cardiac Studies   ECHO 07/16/19  IMPRESSIONS    1. Severe intracavitary gradient. Peak velocity 4.67 m/s. Peak gradient  87.2 mmHg. Systolic anterior motion of the mitral valve. Findings  consistent  with HOCM. Left ventricular ejection fraction, by estimation,  is 60 to 65%. The left ventricle has  normal function. The left ventricle has no regional wall motion  abnormalities. There is severe concentric left ventricular hypertrophy.  Left ventricular diastolic parameters are consistent with Grade II  diastolic dysfunction (pseudonormalization).  2. Right ventricular systolic function is normal. The right ventricular  size is normal.  3. Left atrial size was mildly dilated.  4. The mitral valve is normal in structure. No evidence of mitral valve  regurgitation. No evidence of mitral stenosis.  5. The aortic valve is normal in structure. Aortic valve regurgitation is  not visualized. Moderate aortic valve stenosis. Aortic valve area, by VTI  measures 1.08 cm. Aortic valve mean gradient measures 25.0 mmHg. Aortic  valve Vmax measures 3.52 m/s.  6. Aortic dilatation noted. There is mild dilatation of the ascending  aorta measuring 38 mm.  7. The inferior vena cava is dilated in size with >50% respiratory  variability, suggesting right atrial pressure of 8 mmHg.   FINDINGS  Left Ventricle: Severe intracavitary gradient. Peak velocity 4.67 m/s.  Peak gradient 87.2 mmHg. Systolic anterior motion of the mitral valve.  Findings consistent with HOCM. Left ventricular ejection fraction, by  estimation, is 60 to 65%. The left  ventricle has normal function. The left ventricle has no regional wall  motion abnormalities. The left ventricular internal cavity size was normal  in size. There is severe concentric left ventricular hypertrophy. Left  ventricular diastolic parameters are  consistent with Grade II diastolic dysfunction (pseudonormalization).   Right Ventricle: The right ventricular size is normal. No increase in  right ventricular wall thickness. Right ventricular  systolic function is  normal.   Left Atrium: Left atrial size was mildly dilated.   Right Atrium: Right  atrial size was normal in size.   Pericardium: There is no evidence of pericardial effusion.   Mitral Valve: The mitral valve is normal in structure. Normal mobility of  the mitral valve leaflets. Mild mitral annular calcification. No evidence  of mitral valve regurgitation. No evidence of mitral valve stenosis.   Tricuspid Valve: The tricuspid valve is normal in structure. Tricuspid  valve regurgitation is trivial. No evidence of tricuspid stenosis.   Aortic Valve: The aortic valve is normal in structure. Aortic valve  regurgitation is not visualized. Moderate aortic stenosis is present.  Aortic valve mean gradient measures 25.0 mmHg. Aortic valve peak gradient  measures 49.6 mmHg. Aortic valve area, by  VTI measures 1.08 cm.   Pulmonic Valve: The pulmonic valve was normal in structure. Pulmonic valve  regurgitation is not visualized. No evidence of pulmonic stenosis.   Aorta: Aortic dilatation noted. There is mild dilatation of the ascending  aorta measuring 38 mm.   Venous: The inferior vena cava is dilated in size with greater than 50%  respiratory variability, suggesting right atrial pressure of 8 mmHg.   IAS/Shunts: No atrial level shunt detected by color flow Doppler.   Patient Profile     69 y.o. 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  Strict I &O. Renal function is stable.  starting to get a little alkalotic --neg 1620 since yesterday weight down to 130 kg which appears to be her dry weight.  --labsCr stable  --low sodium diet - will switch IV lasix to po 120 mg bid. If maintains overnight should be able to DC tomorrow.   2.Elevated troponin/Chest pain: -not consistent with ACS. Can evaluate further as outpatient.  - Continue ASA, Statin and BB - troponin all  flat 36 to 30   3.HOCM: -   Per Dr. Flora Lipps'Neal :On my review she has severe concentric LVH with chordal SAM. This was better seen on her echo dated 10/21/2018. On that echo she had PG ~45 mmHG. She is now grossly volume overloaded and with similar chordal SAM on echo from yesterday. 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 On the current echo, her LVOT gradients is 35-45 mmHG based on back calculation of the MR signal which is quite similar to the prior echo. It is not 87 mmHG as reported (incorrectly used the MR peak signal on CW). Clearly she has outflow tract obstruction, but appears to be chordal. AoV visually does not appears to have any stenosis on this echo and prior. No syncope, no FH of SCD, no VT on monitor. Will need MRI at some point to confirm. Given age (68 years), would not be concerned with ICD. HR int he 60s.  Continue BB. Can follow with primary cardiologist. Clearly, we do not have absence of a secondary cause of her severe LVH, so HOCM is in question. Best sorted out in the outpatient setting. Possible MRI Normal EF 60-65% G2DD   4. HTN: - BP stable on current medications  5. NSVT - she did felt palpitations - Continue BB on 50 mg daily  - Mg is 2.4 -  K 4.5 with supplement   DM, COPD, OSA with CPAP per IM and anemia.          For questions or updates, please contact [facility] Please consult [email or web address] for contact info under        Signed, Delphin Funes [place], MD  07/20/2019, 11:34 AM

## 2019-07-21 DIAGNOSIS — R778 Other specified abnormalities of plasma proteins: Secondary | ICD-10-CM

## 2019-07-21 DIAGNOSIS — N1831 Chronic kidney disease, stage 3a: Secondary | ICD-10-CM

## 2019-07-21 LAB — ECHOCARDIOGRAM COMPLETE
AR max vel: 1.13 cm2
AV Area VTI: 1.08 cm2
AV Area mean vel: 1.07 cm2
AV Mean grad: 25 mmHg
AV Peak grad: 49.6 mmHg
Ao pk vel: 3.52 m/s
Area-P 1/2: 3.6 cm2
Height: 63 in
S' Lateral: 2.57 cm
Weight: 4692.8 oz

## 2019-07-21 LAB — GLUCOSE, CAPILLARY
Glucose-Capillary: 120 mg/dL — ABNORMAL HIGH (ref 70–99)
Glucose-Capillary: 200 mg/dL — ABNORMAL HIGH (ref 70–99)

## 2019-07-21 MED ORDER — METOPROLOL SUCCINATE ER 25 MG PO TB24: 75.0000 mg | ORAL_TABLET | Freq: Every day | ORAL | 3 refills | Status: DC

## 2019-07-21 MED ORDER — FUROSEMIDE 40 MG PO TABS: 120.0000 mg | ORAL_TABLET | Freq: Two times a day (BID) | ORAL | 0 refills | Status: DC

## 2019-07-21 NOTE — Discharge Summary (Signed)
Physician Discharge Summary  Morgan Fischer VZC:[id number] DOB: 27-May-1950 DOA: 07/14/2019  PCP: Benito Mccreedy, MD  Admit date: 07/14/2019 Discharge date: 07/21/2019  Admitted From: Home Disposition:  Home  Recommendations for Outpatient Follow-up:  1. Follow up with Cards in 1-2 weeks 2. Please obtain BMP/CBC in one week   Home Health:No Equipment/Devices:None  Discharge Condition:Stable CODE STATUS:Full Diet recommendation: Heart Healthy   Brief/Interim Summary: 69 y.o. female past medical history significant for [place], chronic hypoxic respiratory failure secondary to underlying COPD on 3 L obstructive sleep apnea on BiPAP at night, morbid obesity, diabetes mellitus type 2 comes into the ED for 3 days of chest pain  Discharge Diagnoses:  Principal Problem:   Acute diastolic heart failure ([place]) Active Problems:   Elevated troponin   Morbid obesity (HCC)   Hypertension   Diabetes mellitus without complication (HCC)   COPD (chronic obstructive pulmonary disease) (HCC)   Chest pain   Chronic hypoxemic respiratory failure (HCC)   CKD (chronic kidney disease)  Atypical chest pain with shortness of breath: Cardiac biomarkers have remained basically flat unlikely ACS EKG showed no signs of ischemia, she does have a history of glaucoma. Likely due to heart failure.  Acute on chronic diastolic heart failure: She was started on IV Lasix 120 mg IV twice a day She diuresed over 6 L. She was transitioned to oral Lasix which she will continue as an outpatient. Cardiology was consulted and agreed with management and recommended an MRI at some point as an outpatient.  Essential hypertension: Blood pressure is relatively well controlled continue metoprolol losartan diuretic therapy.  Hyperlipidemia: Continue statins.  Controlled diabetes mellitus type 2: Her metformin was held on admission she was started on long-acting insulin plus sliding scale her A1c was 8.2, She will  resume her home regimen as an outpatient.  Gout: Continue allopurinol.  Morbid obesity: Counseled.  Discharge Instructions  Discharge Instructions    Diet - low sodium heart healthy   Complete by: As directed    Increase activity slowly   Complete by: As directed      Allergies as of 07/21/2019      Reactions   Atorvastatin Swelling   Lip swelling   Ciprocinonide [fluocinolone] Other (See Comments)   Kidney failure   Catapres [clonidine Hcl] Rash   Demadex [torsemide] Rash      Medication List    TAKE these medications   Accu-Chek Aviva Plus w/Device Kit 1 each by Other route as directed.   allopurinol 300 MG tablet Commonly known as: ZYLOPRIM Take 300 mg by mouth daily.   amLODipine 10 MG tablet Commonly known as: NORVASC Take 5 mg by mouth daily.   aspirin 81 MG EC tablet Take 1 tablet (81 mg total) by mouth daily.   BD Pen Needle Nano 2nd Gen 32G X 4 MM Misc Generic drug: Insulin Pen Needle 1 each by Other route as directed.   Breo Ellipta 100-25 MCG/INH Aepb Generic drug: fluticasone furoate-vilanterol Inhale 1 puff into the lungs daily.   Calcium-Vitamin D3 500-400 MG-UNIT Tabs Take 1 tablet by mouth 2 (two) times daily.   ferrous sulfate 325 (65 FE) MG EC tablet Take 1 tablet by mouth daily.   Fish Oil 1000 MG Caps Take 1,000 mg by mouth 2 (two) times daily.   furosemide 40 MG tablet Commonly known as: LASIX Take 3 tablets (120 mg total) by mouth 2 (two) times daily. What changed:   how much to take  how to take this  when to take this  additional instructions   Garlic 1941 MG Caps Take 1,000 mg by mouth daily.   glimepiride 4 MG tablet Commonly known as: AMARYL Take 4 mg by mouth 2 (two) times daily.   ipratropium-albuterol 0.5-2.5 (3) MG/3ML Soln Commonly known as: DUONEB Inhale 3 mLs into the lungs every 4 (four) hours as needed (breathing.).   Lantus SoloStar 100 UNIT/ML Solostar Pen Generic drug: insulin glargine Inject  15 Units into the skin daily.   losartan 100 MG tablet Commonly known as: COZAAR Take 1 tablet by mouth daily.   metFORMIN 500 MG tablet Commonly known as: GLUCOPHAGE Take by mouth 2 (two) times daily with a meal.   metoprolol succinate 25 MG 24 hr tablet Commonly known as: TOPROL-XL Take 3 tablets (75 mg total) by mouth daily. Take with or immediately following a meal. Start taking on: July 22, 2019 What changed:   medication strength  how much to take  additional instructions   montelukast 10 MG tablet Commonly known as: SINGULAIR Take 10 mg by mouth at bedtime.   multivitamin with minerals Tabs tablet Take 1 tablet by mouth daily.   nitroGLYCERIN 0.4 MG SL tablet Commonly known as: NITROSTAT Place 1 tablet (0.4 mg total) under the tongue every 5 (five) minutes as needed for chest pain.   polyethylene glycol 17 g packet Commonly known as: MIRALAX / GLYCOLAX Take 17 g by mouth daily.   potassium chloride 10 MEQ tablet Commonly known as: KLOR-CON Take 20 mEq by mouth daily.   QC Tumeric Complex 500 MG Caps Generic drug: Turmeric Take 1 capsule by mouth daily.   rosuvastatin 40 MG tablet Commonly known as: CRESTOR Take 40 mg by mouth daily.   Ventolin HFA 108 (90 Base) MCG/ACT inhaler Generic drug: albuterol Inhale 2 puffs into the lungs every 6 (six) hours as needed for wheezing or shortness of breath.   VITAMIN B-12 PO Take 1 tablet by mouth daily.   Vitamin D3 50 MCG (2000 UT) capsule Take 1 capsule by mouth daily.       Follow-up Information    Revankar, Reita Cliche, MD Follow up.   Specialty: Cardiology Contact information: [address] [place] [address] [phone]              Allergies  Allergen Reactions  . Atorvastatin Swelling    Lip swelling  . Ciprocinonide [Fluocinolone] Other (See Comments)    Kidney failure  . Catapres [Clonidine Hcl] Rash  . Demadex [Torsemide] Rash     Consultations:  Cards   Procedures/Studies: DG Chest 2 View  Result Date: 07/14/2019 CLINICAL DATA:  Chest pain EXAM: CHEST - 2 VIEW COMPARISON:  October 20, 2018 and chest CT July 21, 2016 FINDINGS: The lungs are clear. Heart is mildly enlarged. There is prominence of central pulmonary arteries with fairly rapid peripheral tapering. The interstitium is mildly prominent with suspected mild interstitial edema. No adenopathy appreciable. There is degenerative change in the thoracic spine. IMPRESSION: Mild cardiomegaly with pulmonary arterial hypertension. Slight interstitial edema. No consolidation. Electronically Signed   By: Lowella Grip III M.D.   On: 07/14/2019 14:32   ECHOCARDIOGRAM COMPLETE  Result Date: 07/16/2019    ECHOCARDIOGRAM REPORT   Patient Name:   Morgan Fischer Date of Exam: 07/16/2019 Medical Rec #:  [id number]    Height:       63.0 in Accession #:    [id number]   Weight:       293.3 lb Date of Birth:  July 13, 1950    BSA:          2.275 m Patient Age:    15 years     BP:           139/52 mmHg Patient Gender: F            HR:           82 bpm. Exam Location:  Inpatient Procedure: 2D Echo, Cardiac Doppler and Color Doppler Indications:    R07.9* Chest pain, unspecified  History:        Patient has prior history of Echocardiogram examinations, most                 recent 10/21/2018. CHF, COPD; Risk Factors:Hypertension,                 Diabetes and Dyslipidemia.  Sonographer:    [name] Sidle Dance Referring Phys: [place]  1. Severe intracavitary gradient. Peak velocity 4.67 m/s. Peak gradient 87.2 mmHg. Systolic anterior motion of the mitral valve. Findings consistent with HOCM. Left ventricular ejection fraction, by estimation, is 60 to 65%. The left ventricle has normal function. The left ventricle has no regional wall motion abnormalities. There is severe concentric left ventricular hypertrophy. Left ventricular diastolic parameters are consistent with Grade II  diastolic dysfunction (pseudonormalization).  2. Right ventricular systolic function is normal. The right ventricular size is normal.  3. Left atrial size was mildly dilated.  4. The mitral valve is normal in structure. No evidence of mitral valve regurgitation. No evidence of mitral stenosis.  5. The aortic valve is normal in structure. Aortic valve regurgitation is not visualized. Moderate aortic valve stenosis. Aortic valve area, by VTI measures 1.08 cm. Aortic valve mean gradient measures 25.0 mmHg. Aortic valve Vmax measures 3.52 m/s.  6. Aortic dilatation noted. There is mild dilatation of the ascending aorta measuring 38 mm.  7. The inferior vena cava is dilated in size with >50% respiratory variability, suggesting right atrial pressure of 8 mmHg. FINDINGS  Left Ventricle: Severe intracavitary gradient. Peak velocity 4.67 m/s. Peak gradient 87.2 mmHg. Systolic anterior motion of the mitral valve. Findings consistent with HOCM. Left ventricular ejection fraction, by estimation, is 60 to 65%. The left ventricle has normal function. The left ventricle has no regional wall motion abnormalities. The left ventricular internal cavity size was normal in size. There is severe concentric left ventricular hypertrophy. Left ventricular diastolic parameters are consistent with Grade II diastolic dysfunction (pseudonormalization). Right Ventricle: The right ventricular size is normal. No increase in right ventricular wall thickness. Right ventricular systolic function is normal. Left Atrium: Left atrial size was mildly dilated. Right Atrium: Right atrial size was normal in size. Pericardium: There is no evidence of pericardial effusion. Mitral Valve: The mitral valve is normal in structure. Normal mobility of the mitral valve leaflets. Mild mitral annular calcification. No evidence of mitral valve regurgitation. No evidence of mitral valve stenosis. Tricuspid Valve: The tricuspid valve is normal in structure. Tricuspid  valve regurgitation is trivial. No evidence of tricuspid stenosis. Aortic Valve: The aortic valve is normal in structure. Aortic valve regurgitation is not visualized. Moderate aortic stenosis is present. Aortic valve mean gradient measures 25.0 mmHg. Aortic valve peak gradient measures 49.6 mmHg. Aortic valve area, by VTI measures 1.08 cm. Pulmonic Valve: The pulmonic valve was normal in structure. Pulmonic valve regurgitation is not visualized. No evidence of pulmonic stenosis. Aorta: Aortic dilatation noted. There is mild dilatation of the ascending aorta measuring  38 mm. Venous: The inferior vena cava is dilated in size with greater than 50% respiratory variability, suggesting right atrial pressure of 8 mmHg. IAS/Shunts: No atrial level shunt detected by color flow Doppler.  LEFT VENTRICLE PLAX 2D LVIDd:         5.20 cm  Diastology LVIDs:         2.57 cm  LV e' lateral:   8.49 cm/s LV PW:         1.61 cm  LV E/e' lateral: 17.4 LV IVS:        1.97 cm LVOT diam:     1.80 cm LV SV:         92 LV SV Index:   40 LVOT Area:     2.54 cm  RIGHT VENTRICLE             IVC RV Basal diam:  2.20 cm     IVC diam: 2.75 cm RV S prime:     10.30 cm/s TAPSE (M-mode): 2.2 cm LEFT ATRIUM           Index       RIGHT ATRIUM           Index LA diam:      5.50 cm 2.42 cm/m  RA Area:     14.10 cm LA Vol (A2C): 51.0 ml 22.41 ml/m RA Volume:   32.50 ml  14.28 ml/m LA Vol (A4C): 74.0 ml 32.52 ml/m  AORTIC VALVE AV Area (Vmax):    1.13 cm AV Area (Vmean):   1.07 cm AV Area (VTI):     1.08 cm AV Vmax:           352.00 cm/s AV Vmean:          232.000 cm/s AV VTI:            0.846 m AV Peak Grad:      49.6 mmHg AV Mean Grad:      25.0 mmHg LVOT Vmax:         156.00 cm/s LVOT Vmean:        98.000 cm/s LVOT VTI:          0.360 m LVOT/AV VTI ratio: 0.43  AORTA Ao Root diam: 2.80 cm Ao Asc diam:  3.80 cm MITRAL VALVE MV Area (PHT): 3.60 cm     SHUNTS MV Decel Time: 211 msec     Systemic VTI:  0.36 m MV E velocity: 148.00 cm/s  Systemic  Diam: 1.80 cm MV A velocity: 75.10 cm/s MV E/A ratio:  1.97 Skeet Latch MD Electronically signed by Skeet Latch MD Signature Date/Time: 07/16/2019/4:18:06 PM    Final     (Echo, Carotid, EGD, Colonoscopy, ERCP)    Subjective: No complaints feels great.  Discharge Exam: Vitals:   07/21/19 0800 07/21/19 0835  BP: 133/73   Pulse: (!) 57 65  Resp:    Temp: 98 F (36.7 C)   SpO2: 100%    Vitals:   07/21/19 0011 07/21/19 0504 07/21/19 0800 07/21/19 0835  BP: (!) 102/51 (!) 121/50 133/73   Pulse: (!) 57 64 (!) 57 65  Resp: 16 17    Temp: 97.7 F (36.5 C) (!) 97.5 F (36.4 C) 98 F (36.7 C)   TempSrc: Oral Oral Oral   SpO2: 100% 95% 100%   Weight:  130.2 kg    Height:        General: Pt is alert, awake, not in acute distress Cardiovascular: RRR, S1/S2 +, no  rubs, no gallops Respiratory: CTA bilaterally, no wheezing, no rhonchi Abdominal: Soft, NT, ND, bowel sounds + Extremities: no edema, no cyanosis    The results of significant diagnostics from this hospitalization (including imaging, microbiology, ancillary and laboratory) are listed below for reference.     Microbiology: Recent Results (from the past 240 hour(s))  SARS Coronavirus 2 by RT PCR (hospital order, performed in [facility] hospital lab) Nasopharyngeal Nasopharyngeal Swab     Status: None   Collection Time: 07/14/19  8:40 PM   Specimen: Nasopharyngeal Swab  Result Value Ref Range Status   SARS Coronavirus 2 NEGATIVE NEGATIVE Final    Comment: (NOTE) SARS-CoV-2 target nucleic acids are NOT DETECTED.  The SARS-CoV-2 RNA is generally detectable in upper and lower respiratory specimens during the acute phase of infection. The lowest concentration of SARS-CoV-2 viral copies this assay can detect is 250 copies / mL. A negative result does not preclude SARS-CoV-2 infection and should not be used as the sole basis for treatment or other patient management decisions.  A negative result may occur  with improper specimen collection / handling, submission of specimen other than nasopharyngeal swab, presence of viral mutation(s) within the areas targeted by this assay, and inadequate number of viral copies (<250 copies / mL). A negative result must be combined with clinical observations, patient history, and epidemiological information.  Fact Sheet for Patients:   StrictlyIdeas.no  Fact Sheet for Healthcare Providers: BankingDealers.co.za  This test is not yet approved or  cleared by the [place] FDA and has been authorized for detection and/or diagnosis of SARS-CoV-2 by FDA under an Emergency Use Authorization (EUA).  This EUA will remain in effect (meaning this test can be used) for the duration of the COVID-19 declaration under Section 564(b)(1) of the Act, 21 U.S.C. section 360bbb-3(b)(1), unless the authorization is terminated or revoked sooner.  Performed at [facility], [address] [place], [place] [address]      Labs: BNP (last 3 results) Recent Labs    10/20/18 1828 07/14/19 1506  BNP 671.3* 751.0*   Basic Metabolic Panel: Recent Labs  Lab 07/15/19 1510 07/15/19 1510 07/16/19 0340 07/17/19 0942 07/18/19 0452 07/19/19 0832 07/20/19 0612  NA 143   < > 142 141 143 142 144  K 3.7   < > 3.8 4.4 4.5 4.4 4.4  CL 94*   < > 96* 97* 98 97* 100  CO2 36*   < > 34* 32 36* 34* 33*  GLUCOSE 118*   < > 131* 295* 82 190* 120*  BUN 30*   < > 30* 27* 29* 28* 35*  CREATININE 0.81   < > 0.86 1.01* 1.03* 0.99 0.99  CALCIUM 10.1   < > 9.3 9.2 9.3 9.2 9.3  MG 2.4  --   --   --   --   --   --    < > = values in this interval not displayed.   Liver Function Tests: Recent Labs  Lab 07/15/19 1510 07/16/19 0340  AST 21 18  ALT 20 17  ALKPHOS 64 49  BILITOT 0.6 0.6  PROT 7.4 6.1*  ALBUMIN 4.1 3.2*   No results for input(s): LIPASE, AMYLASE in the last 168 hours. No results for input(s): AMMONIA in  the last 168 hours. CBC: Recent Labs  Lab 07/14/19 1506 07/15/19 1510 07/16/19 0340 07/18/19 0452  WBC 6.1 5.7 5.0 5.3  HGB 10.8* 11.0* 8.9* 9.2*  HCT 36.6 37.5 30.5* 31.2*  MCV 90.6 89.9 90.2 91.2  PLT 168 161 126* 139*   Cardiac Enzymes: No results for input(s): CKTOTAL, CKMB, CKMBINDEX, TROPONINI in the last 168 hours. BNP: Invalid input(s): POCBNP CBG: Recent Labs  Lab 07/20/19 0601 07/20/19 1115 07/20/19 1652 07/20/19 2126 07/21/19 0600  GLUCAP 126* 168* 170* 223* 120*   D-Dimer No results for input(s): DDIMER in the last 72 hours. Hgb A1c No results for input(s): HGBA1C in the last 72 hours. Lipid Profile No results for input(s): CHOL, HDL, LDLCALC, TRIG, CHOLHDL, LDLDIRECT in the last 72 hours. Thyroid function studies No results for input(s): TSH, T4TOTAL, T3FREE, THYROIDAB in the last 72 hours.  Invalid input(s): FREET3 Anemia work up No results for input(s): VITAMINB12, FOLATE, FERRITIN, TIBC, IRON, RETICCTPCT in the last 72 hours. Urinalysis    Component Value Date/Time   COLORURINE STRAW (A) 07/17/2019 1311   APPEARANCEUR CLEAR 07/17/2019 1311   LABSPEC 1.008 07/17/2019 1311   PHURINE 7.0 07/17/2019 1311   GLUCOSEU 150 (A) 07/17/2019 1311   HGBUR SMALL (A) 07/17/2019 1311   BILIRUBINUR NEGATIVE 07/17/2019 1311   KETONESUR NEGATIVE 07/17/2019 1311   PROTEINUR NEGATIVE 07/17/2019 1311   NITRITE NEGATIVE 07/17/2019 1311   LEUKOCYTESUR NEGATIVE 07/17/2019 1311   Sepsis Labs Invalid input(s): PROCALCITONIN,  WBC,  LACTICIDVEN Microbiology Recent Results (from the past 240 hour(s))  SARS Coronavirus 2 by RT PCR (hospital order, performed in [facility] hospital lab) Nasopharyngeal Nasopharyngeal Swab     Status: None   Collection Time: 07/14/19  8:40 PM   Specimen: Nasopharyngeal Swab  Result Value Ref Range Status   SARS Coronavirus 2 NEGATIVE NEGATIVE Final    Comment: (NOTE) SARS-CoV-2 target nucleic acids are NOT DETECTED.  The SARS-CoV-2  RNA is generally detectable in upper and lower respiratory specimens during the acute phase of infection. The lowest concentration of SARS-CoV-2 viral copies this assay can detect is 250 copies / mL. A negative result does not preclude SARS-CoV-2 infection and should not be used as the sole basis for treatment or other patient management decisions.  A negative result may occur with improper specimen collection / handling, submission of specimen other than nasopharyngeal swab, presence of viral mutation(s) within the areas targeted by this assay, and inadequate number of viral copies (<250 copies / mL). A negative result must be combined with clinical observations, patient history, and epidemiological information.  Fact Sheet for Patients:   StrictlyIdeas.no  Fact Sheet for Healthcare Providers: BankingDealers.co.za  This test is not yet approved or  cleared by the [place] FDA and has been authorized for detection and/or diagnosis of SARS-CoV-2 by FDA under an Emergency Use Authorization (EUA).  This EUA will remain in effect (meaning this test can be used) for the duration of the COVID-19 declaration under Section 564(b)(1) of the Act, 21 U.S.C. section 360bbb-3(b)(1), unless the authorization is terminated or revoked sooner.  Performed at [facility], [address] [place], [place] [address]      Time coordinating discharge: Over 40 minutes  SIGNED:   Charlynne Cousins, MD  [facility] 07/21/2019, 10:19 AM Pager   If 7PM-7AM, please contact night-coverage [email or web address] Password [email or web address]

## 2019-07-21 NOTE — Plan of Care (Signed)
  Problem: Education: Goal: Ability to demonstrate management of disease process will improve Outcome: Progressing   Problem: Education: Goal: Ability to verbalize understanding of medication therapies will improve Outcome: Progressing   

## 2019-07-21 NOTE — Care Management Important Message (Signed)
Important Message  Patient Details  Name: Morgan Fischer MRN: [id number] Date of Birth: 1950-10-22   Medicare Important Message Given:  Yes     Renie Ora 07/21/2019, 10:42 AM

## 2019-07-21 NOTE — Progress Notes (Addendum)
Progress Note  Patient Name: Morgan Fischer Date of Encounter: 07/21/2019  [facility] Cardiologist: Garwin Brothers, MD   Subjective   Feeling well.  She thinks that her breathing is back to her baseline.  Inpatient Medications    Scheduled Meds: . allopurinol  300 mg Oral Daily  . aspirin EC  81 mg Oral Daily  . enoxaparin (LOVENOX) injection  60 mg Subcutaneous Q24H  . ferrous sulfate  325 mg Oral Daily  . fluticasone furoate-vilanterol  1 puff Inhalation Daily  . furosemide  120 mg Oral BID  . insulin aspart  0-15 Units Subcutaneous TID WC  . insulin aspart  0-5 Units Subcutaneous QHS  . insulin aspart  4 Units Subcutaneous TID WC  . insulin glargine  10 Units Subcutaneous BID  . losartan  100 mg Oral Daily  . metoprolol succinate  75 mg Oral Daily  . montelukast  10 mg Oral QHS  . potassium chloride  40 mEq Oral BID  . rosuvastatin  40 mg Oral Daily  . sodium chloride flush  3 mL Intravenous Once   Continuous Infusions:  PRN Meds: acetaminophen **OR** acetaminophen, albuterol, ipratropium-albuterol, morphine injection, nitroGLYCERIN, ondansetron **OR** ondansetron (ZOFRAN) IV   Vital Signs    Vitals:   07/21/19 0011 07/21/19 0504 07/21/19 0800 07/21/19 0835  BP: (!) 102/51 (!) 121/50 133/73   Pulse: (!) 57 64 (!) 57 65  Resp: 16 17    Temp: 97.7 F (36.5 C) (!) 97.5 F (36.4 C) 98 F (36.7 C)   TempSrc: Oral Oral Oral   SpO2: 100% 95% 100%   Weight:  130.2 kg    Height:        Intake/Output Summary (Last 24 hours) at 07/21/2019 0950 Last data filed at 07/21/2019 0231 Gross per 24 hour  Intake 960 ml  Output 2175 ml  Net -1215 ml   Last 3 Weights 07/21/2019 07/20/2019 07/19/2019  Weight (lbs) 287 lb 1.6 oz 287 lb 6.4 oz 288 lb 4.8 oz  Weight (kg) 130.228 kg 130.364 kg 130.772 kg      Telemetry    Sinus rhythm.  No events.- Personally Reviewed  ECG    n/a - Personally Reviewed  Physical Exam   VS:  BP 133/73   Pulse 65   Temp 98 F (36.7  C) (Oral)   Resp 17   Ht 5\' 3"  (1.6 m)   Wt 130.2 kg   LMP  (LMP Unknown)   SpO2 100%   BMI 50.86 kg/m  , BMI Body mass index is 50.86 kg/m. GENERAL:  Well appearing HEENT: Pupils equal round and reactive, fundi not visualized, oral mucosa unremarkable NECK:  No jugular venous distention, waveform within normal limits, carotid upstroke brisk and symmetric, no bruits LUNGS:  Clear to auscultation bilaterally HEART:  RRR.  PMI not displaced or sustained,S1 and S2 within normal limits, no S3, no S4, no clicks, no rubs, III/VI systolic murmur at the LLSB ABD:  Flat, positive bowel sounds normal in frequency in pitch, no bruits, no rebound, no guarding, no midline pulsatile mass, no hepatomegaly, no splenomegaly EXT:  2 plus pulses throughout, woody LE edema, no cyanosis no clubbing SKIN:  No rashes no nodules NEURO:  Cranial nerves II through XII grossly intact, motor grossly intact throughout PSYCH:  Cognitively intact, oriented to person place and time   Labs    High Sensitivity Troponin:   Recent Labs  Lab 07/16/19 0340 07/16/19 1024 07/16/19 1551 07/16/19 2131 07/17/19 0444  TROPONINIHS 36* 31* 29* 33* 30*      Chemistry Recent Labs  Lab 07/15/19 1510 07/15/19 1510 07/16/19 0340 07/17/19 0942 07/18/19 0452 07/19/19 0832 07/20/19 0612  NA 143   < > 142   < > 143 142 144  K 3.7   < > 3.8   < > 4.5 4.4 4.4  CL 94*   < > 96*   < > 98 97* 100  CO2 36*   < > 34*   < > 36* 34* 33*  GLUCOSE 118*   < > 131*   < > 82 190* 120*  BUN 30*   < > 30*   < > 29* 28* 35*  CREATININE 0.81   < > 0.86   < > 1.03* 0.99 0.99  CALCIUM 10.1   < > 9.3   < > 9.3 9.2 9.3  PROT 7.4  --  6.1*  --   --   --   --   ALBUMIN 4.1  --  3.2*  --   --   --   --   AST 21  --  18  --   --   --   --   ALT 20  --  17  --   --   --   --   ALKPHOS 64  --  49  --   --   --   --   BILITOT 0.6  --  0.6  --   --   --   --   GFRNONAA >60   < > >60   < > 55* 58* 58*  GFRAA >60   < > >60   < > >60 >60 >60   ANIONGAP 13   < > 12   < > 9 11 11    < > = values in this interval not displayed.     Hematology Recent Labs  Lab 07/15/19 1510 07/16/19 0340 07/18/19 0452  WBC 5.7 5.0 5.3  RBC 4.17 3.38* 3.42*  HGB 11.0* 8.9* 9.2*  HCT 37.5 30.5* 31.2*  MCV 89.9 90.2 91.2  MCH 26.4 26.3 26.9  MCHC 29.3* 29.2* 29.5*  RDW 16.4* 16.5* 16.5*  PLT 161 126* 139*    BNP Recent Labs  Lab 07/14/19 1506  BNP 329.2*     DDimer  Recent Labs  Lab 07/15/19 1510  DDIMER 0.43     Radiology    No results found.  Cardiac Studies   Echo 07/16/19: 1. Severe intracavitary gradient. Peak velocity 4.67 m/s. Peak gradient  87.2 mmHg. Systolic anterior motion of the mitral valve. Findings  consistent with HOCM. Left ventricular ejection fraction, by estimation,  is 60 to 65%. The left ventricle has  normal function. The left ventricle has no regional wall motion  abnormalities. There is severe concentric left ventricular hypertrophy.  Left ventricular diastolic parameters are consistent with Grade II  diastolic dysfunction (pseudonormalization).  2. Right ventricular systolic function is normal. The right ventricular  size is normal.  3. Left atrial size was mildly dilated.  4. The mitral valve is normal in structure. No evidence of mitral valve  regurgitation. No evidence of mitral stenosis.  5. The aortic valve is normal in structure. Aortic valve regurgitation is  not visualized. No aortic valve stenosis.  6. Aortic dilatation noted. There is mild dilatation of the ascending  aorta measuring 38 mm.  7. The inferior vena cava is dilated in size with >50% respiratory  variability, suggesting right atrial  pressure of 8 mmHg.   Patient Profile     69 y.o. 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  Assessment & Plan    # Acute on chronic  diastolic heart failure: # HOCM:  Volume status stable and her breathing is back to baseline.  She is net -6.7 L this admission.  She is bet -1.1L in the last 24 hours.  She is maintaining a negative fluid balance on oral Lasix.  Her respiratory status is back to baseline.  She has been discharged.  Patient has HOCM physiology with increased intracavitary gradient.  Gradient severity is challenging to assess given concomitant MR.  Endocardial border definition is poor on her echos given body habitus. Agree with considering outpatient cardiac MRI to better assess for HOCM vs poorly controlled HTN as the cause of here severe LVH.  No ICD given age.  Continue metoprolol and losartan.    #Chest pain: #Demand ischemia: High-sensitivity troponin was elevated but flat ranging from 30-36.  Findings not consistent with ACS.  More consistent with demand ischemia.  # OSA:  Continue bipap use.  # HTN: BP   # NSVT:  None in the last 24 hours.  Continue metoprolol.   [facility] will sign off.   Medication Recommendations:  No changes Other recommendations (labs, testing, etc):  Outpatient MRI when stable  Follow up as an outpatient:  Scheduled 7/28  For questions or updates, please contact [facility] Please consult [email or web address] for contact info under        Signed, Chilton Si, MD  07/21/2019, 9:50 AM

## 2019-07-21 NOTE — Progress Notes (Signed)
Pt sitting in chair. O2 at home dose. Pt received all instructions for discharge, including when to call the doctor, when to go to the ER and diet recommendations. Pt is stable at time of teaching. NO needs or concerns at this time. NO questions at this time.IV removed. Will continue to monitor until pt leaves the unit.

## 2019-07-23 ENCOUNTER — Other Ambulatory Visit: Payer: Self-pay

## 2019-07-23 NOTE — Patient Outreach (Signed)
[facility]) Care Management  07/23/2019  Morgan Fischer May 15, 1950 [id number]   Referral Date: 07/23/19 Referral Source: [employer] Report Date of Discharge: 07/21/19 Facility: [facility]  Insurance: [facility]   Referral received.  No outreach warranted at this time.  Transition of Care calls being completed via EMMI. RN CM will outreach patient for any red flags received.    Plan: RN CM will close case.    Bary Leriche, RN, MSN [facility] Care Management Care Management Coordinator Direct Line [phone] Toll Free: [phone]  Fax: [phone]

## 2019-07-28 DIAGNOSIS — I872 Venous insufficiency (chronic) (peripheral): Secondary | ICD-10-CM | POA: Diagnosis not present

## 2019-07-28 DIAGNOSIS — E1165 Type 2 diabetes mellitus with hyperglycemia: Secondary | ICD-10-CM | POA: Diagnosis not present

## 2019-07-28 DIAGNOSIS — E782 Mixed hyperlipidemia: Secondary | ICD-10-CM | POA: Diagnosis not present

## 2019-07-28 DIAGNOSIS — I119 Hypertensive heart disease without heart failure: Secondary | ICD-10-CM | POA: Diagnosis not present

## 2019-07-28 DIAGNOSIS — J449 Chronic obstructive pulmonary disease, unspecified: Secondary | ICD-10-CM | POA: Diagnosis not present

## 2019-07-28 DIAGNOSIS — E611 Iron deficiency: Secondary | ICD-10-CM | POA: Diagnosis not present

## 2019-07-28 DIAGNOSIS — J45909 Unspecified asthma, uncomplicated: Secondary | ICD-10-CM | POA: Diagnosis not present

## 2019-07-28 DIAGNOSIS — N179 Acute kidney failure, unspecified: Secondary | ICD-10-CM | POA: Diagnosis not present

## 2019-07-28 DIAGNOSIS — I10 Essential (primary) hypertension: Secondary | ICD-10-CM | POA: Diagnosis not present

## 2019-07-29 DIAGNOSIS — E119 Type 2 diabetes mellitus without complications: Secondary | ICD-10-CM | POA: Insufficient documentation

## 2019-07-29 DIAGNOSIS — E7800 Pure hypercholesterolemia, unspecified: Secondary | ICD-10-CM | POA: Insufficient documentation

## 2019-07-29 DIAGNOSIS — M1990 Unspecified osteoarthritis, unspecified site: Secondary | ICD-10-CM | POA: Insufficient documentation

## 2019-07-30 ENCOUNTER — Ambulatory Visit (INDEPENDENT_AMBULATORY_CARE_PROVIDER_SITE_OTHER): Payer: Medicare HMO | Admitting: Cardiology

## 2019-07-30 ENCOUNTER — Other Ambulatory Visit: Payer: Self-pay

## 2019-07-30 ENCOUNTER — Encounter: Payer: Self-pay | Admitting: Cardiology

## 2019-07-30 VITALS — BP 134/58 | HR 76 | Ht 63.5 in | Wt 290.1 lb

## 2019-07-30 DIAGNOSIS — I421 Obstructive hypertrophic cardiomyopathy: Secondary | ICD-10-CM | POA: Diagnosis not present

## 2019-07-30 DIAGNOSIS — I5023 Acute on chronic systolic (congestive) heart failure: Secondary | ICD-10-CM | POA: Diagnosis not present

## 2019-07-30 DIAGNOSIS — G4733 Obstructive sleep apnea (adult) (pediatric): Secondary | ICD-10-CM

## 2019-07-30 MED ORDER — FUROSEMIDE 80 MG PO TABS
80.0000 mg | ORAL_TABLET | Freq: Two times a day (BID) | ORAL | 3 refills | Status: DC
Start: 2019-07-30 — End: 2020-03-12

## 2019-07-30 NOTE — Patient Instructions (Signed)
Medication Instructions:  Your physician has recommended you make the following change in your medication:  1.DECREASE: LASIX TO 80 MG TWICE DAILY.  *If you need a refill on your cardiac medications before your next appointment, please call your pharmacy*   Lab Work: TODAY: BMET If you have labs (blood work) drawn today and your tests are completely normal, you will receive your results only by: Marland Kitchen MyChart Message (if you have MyChart) OR . A paper copy in the mail If you have any lab test that is abnormal or we need to change your treatment, we will call you to review the results.   Testing/Procedures: NONE   Follow-Up: At [facility], you and your health needs are our priority.  As part of our continuing mission to provide you with exceptional heart care, we have created designated Provider Care Teams.  These Care Teams include your primary Cardiologist (physician) and Advanced Practice Providers (APPs -  Physician Assistants and Nurse Practitioners) who all work together to provide you with the care you need, when you need it.  We recommend signing up for the patient portal called "MyChart".  Sign up information is provided on this After Visit Summary.  MyChart is used to connect with patients for Virtual Visits (Telemedicine).  Patients are able to view lab/test results, encounter notes, upcoming appointments, etc.  Non-urgent messages can be sent to your provider as well.   To learn more about what you can do with MyChart, go to [email or web address].    Your next appointment:   3 month(s)  The format for your next appointment:   In Person  Provider:   Belva Crome, MD

## 2019-07-30 NOTE — Progress Notes (Signed)
Cardiology Office Note:    Date:  07/30/2019   ID:  Morgan Fischer, DOB Dec 16, 1950, MRN [id number]  PCP:  Benito Mccreedy, MD  Cardiologist:  Jenean Lindau, MD   Referring MD: Benito Mccreedy, MD    ASSESSMENT:    1. HOCM (hypertrophic obstructive cardiomyopathy) ([place])   2. Acute on chronic systolic congestive heart failure ([place])   3. Morbid obesity ([place])   4. Obstructive sleep apnea    PLAN:    In order of problems listed above:  1. I reviewed records from hospital admission extensively.  Primary prevention stressed. 2. Congestive heart failure: Patient is on appropriate medications.  We will cut down her furosemide to 80 mg twice daily.  She requested also.  We will also have a Chem-7 today.  Diet was emphasized.  Salt intake issues and weight checks on a daily basis were educated again. 3. Hypertrophic obstructive cardiomyopathy: Findings were discussed with the patient at length.  I reviewed records at hospital stay.  I discussed the MRI with the patient is not keen on any further evaluation at this time.  She tells me that she is comfortable and she does not want to get into procedures which can have risks.  I respect her wishes and will continue current medications. 4. Morbid obesity: Diet was emphasized weight reduction was stressed she promises to comply 5. COPD and oxygen managed by primary care physician. 6. Patient will be seen in follow-up appointment in 3 months or earlier if the patient has any concerns    Medication Adjustments/Labs and Tests Ordered: Current medicines are reviewed at length with the patient today.  Concerns regarding medicines are outlined above.  No orders of the defined types were placed in this encounter.  No orders of the defined types were placed in this encounter.    No chief complaint on file.    History of Present Illness:    Morgan Fischer is a 69 y.o. female.  Patient has past medical history of hypertrophic cardiomyopathy  which is obstructive, essential hypertension dyslipidemia and morbid obesity.  She is a diabetic.  She has COPD on oxygen.  She is brought in by her attendant in a wheelchair.  Patient was admitted to the hospital with preserved ejection fraction congestive heart failure and echocardiogram revealed hypertrophic cardiomyopathy with severe gradient.  She was treated medically and released.  She denies any problems at this time she is comfortable.  No chest pain orthopnea or PND.  At the time of my evaluation, the patient is alert awake oriented and in no distress.  Past Medical History:  Diagnosis Date  . Acute diastolic heart failure ([place]) 07/17/2019  . Acute hypercapnic respiratory failure ([place]) 07/21/2016  . Acute on chronic congestive heart failure ([place]) 07/19/2016  . Acute on chronic respiratory failure with hypoxia ([place]) 10/20/2018  . Acute respiratory acidosis 07/19/2016  . Acute respiratory distress 07/19/2016  . Aortic stenosis 11/01/2018  . Arthritis   . Asthma   . Atherosclerosis of native coronary artery of native heart without angina pectoris 03/17/2016  . Chest pain 07/14/2019  . CHF (congestive heart failure) ([place])   . Chronic diastolic heart failure ([place]) 03/17/2016  . Chronic hypoxemic respiratory failure ([place])   . CKD (chronic kidney disease)   . COPD (chronic obstructive pulmonary disease) ([place])   . Diabetes mellitus without complication ([place])   . Drug allergy 07/21/2016  . Essential (primary) hypertension 11/19/2016  . Gout 11/19/2016  . Heart block AV complete ([place])   .  High cholesterol   . HOCM (hypertrophic obstructive cardiomyopathy) ([place]) 09/18/2017  . Hyperglycemia due to type 2 diabetes mellitus ([place]) 07/19/2016  . Hypertension   . Hypertensive heart disease with heart failure ([place]) 03/17/2016  . Hypokalemia 08/24/2015  . Hypomagnesemia   . Hypoxia 03/17/2016  . Leukocytosis 07/19/2016  . Morbid obesity ([place])   . Nonrheumatic aortic valve stenosis 03/17/2016  .  Obstructive sleep apnea 11/19/2016  . Panniculitis 08/23/2015  . Pure hypercholesterolemia 02/13/2017  . SOB (shortness of breath) 08/24/2015  . Troponin level elevated 08/24/2015  . Type 2 diabetes mellitus, without long-term current use of insulin ([place]) 11/19/2016    Past Surgical History:  Procedure Laterality Date  . ABDOMINAL HYSTERECTOMY    . CESAREAN SECTION      Current Medications: Current Meds  Medication Sig  . allopurinol (ZYLOPRIM) 300 MG tablet Take 300 mg by mouth daily.  Marland Kitchen amLODipine (NORVASC) 10 MG tablet Take 5 mg by mouth daily.   Marland Kitchen aspirin EC 81 MG EC tablet Take 1 tablet (81 mg total) by mouth daily.  . BD PEN NEEDLE NANO 2ND GEN 32G X 4 MM MISC 1 each by Other route as directed.   . Blood Glucose Monitoring Suppl (ACCU-CHEK AVIVA PLUS) w/Device KIT 1 each by Other route as directed.   Marland Kitchen BREO ELLIPTA 100-25 MCG/INH AEPB Inhale 1 puff into the lungs daily.   . Calcium Carb-Cholecalciferol (CALCIUM-VITAMIN D3) 500-400 MG-UNIT TABS Take 1 tablet by mouth 2 (two) times daily.   . Cholecalciferol (VITAMIN D3) 2000 units capsule Take 1 capsule by mouth daily.  . Cyanocobalamin (VITAMIN B-12 PO) Take 1 tablet by mouth daily.  . ferrous sulfate 325 (65 FE) MG EC tablet Take 1 tablet by mouth daily.  . furosemide (LASIX) 40 MG tablet Take 3 tablets (120 mg total) by mouth 2 (two) times daily.  . Garlic 8921 MG CAPS Take 1,000 mg by mouth daily.  Marland Kitchen glimepiride (AMARYL) 4 MG tablet Take 4 mg by mouth 2 (two) times daily.  Marland Kitchen ipratropium-albuterol (DUONEB) 0.5-2.5 (3) MG/3ML SOLN Inhale 3 mLs into the lungs every 4 (four) hours as needed (breathing.).   Marland Kitchen LANTUS SOLOSTAR 100 UNIT/ML Solostar Pen Inject 15 Units into the skin daily.   Marland Kitchen losartan (COZAAR) 100 MG tablet Take 1 tablet by mouth daily.  . metFORMIN (GLUCOPHAGE) 500 MG tablet Take by mouth 2 (two) times daily with a meal.  . metoprolol succinate (TOPROL-XL) 25 MG 24 hr tablet Take 25 mg by mouth daily.  . metoprolol  succinate (TOPROL-XL) 50 MG 24 hr tablet Take 50 mg by mouth daily.  . montelukast (SINGULAIR) 10 MG tablet Take 10 mg by mouth at bedtime.  . Multiple Vitamin (MULTIVITAMIN WITH MINERALS) TABS tablet Take 1 tablet by mouth daily.  . nitroGLYCERIN (NITROSTAT) 0.4 MG SL tablet Place 1 tablet (0.4 mg total) under the tongue every 5 (five) minutes as needed for chest pain.  . Omega-3 Fatty Acids (FISH OIL) 1000 MG CAPS Take 1,000 mg by mouth 2 (two) times daily.   . OXYGEN Inhale 3 L into the lungs.  . polyethylene glycol (MIRALAX / GLYCOLAX) 17 g packet Take 17 g by mouth daily.  . potassium chloride (KLOR-CON) 10 MEQ tablet Take 20 mEq by mouth daily.   . rosuvastatin (CRESTOR) 40 MG tablet Take 40 mg by mouth daily.  . Turmeric (QC TUMERIC COMPLEX) 500 MG CAPS Take 1 capsule by mouth daily.  . VENTOLIN HFA 108 (90 Base) MCG/ACT inhaler  Inhale 2 puffs into the lungs every 6 (six) hours as needed for wheezing or shortness of breath.      Allergies:   Atorvastatin, Ciprocinonide [fluocinolone], Catapres [clonidine hcl], and Demadex [torsemide]   Social History   Socioeconomic History  . Marital status: Widowed    Spouse name: Not on file  . Number of children: Not on file  . Years of education: Not on file  . Highest education level: Not on file  Occupational History  . Not on file  Tobacco Use  . Smoking status: Never Smoker  . Smokeless tobacco: Never Used  Vaping Use  . Vaping Use: Never used  Substance and Sexual Activity  . Alcohol use: No  . Drug use: No  . Sexual activity: Never  Other Topics Concern  . Not on file  Social History Narrative  . Not on file   Social Determinants of Health   Financial Resource Strain:   . Difficulty of Paying Living Expenses:   Food Insecurity:   . Worried About Charity fundraiser in the Last Year:   . Arboriculturist in the Last Year:   Transportation Needs:   . Film/video editor (Medical):   Marland Kitchen Lack of Transportation  (Non-Medical):   Physical Activity:   . Days of Exercise per Week:   . Minutes of Exercise per Session:   Stress:   . Feeling of Stress :   Social Connections:   . Frequency of Communication with Friends and Family:   . Frequency of Social Gatherings with Friends and Family:   . Attends Religious Services:   . Active Member of Clubs or Organizations:   . Attends Archivist Meetings:   Marland Kitchen Marital Status:      Family History: The patient's family history includes Breast cancer in her sister; CAD in her mother; Cancer in her father and sister; Diabetes in her mother; Diabetes Mellitus I in her mother; Heart disease in her father.  ROS:   Please see the history of present illness.    All other systems reviewed and are negative.  EKGs/Labs/Other Studies Reviewed:    The following studies were reviewed today: IMPRESSIONS    1. Severe intracavitary gradient. Peak velocity 4.67 m/s. Peak gradient  87.2 mmHg. Systolic anterior motion of the mitral valve. Findings  consistent with HOCM. Left ventricular ejection fraction, by estimation,  is 60 to 65%. The left ventricle has  normal function. The left ventricle has no regional wall motion  abnormalities. There is severe concentric left ventricular hypertrophy.  Left ventricular diastolic parameters are consistent with Grade II  diastolic dysfunction (pseudonormalization).  2. Right ventricular systolic function is normal. The right ventricular  size is normal.  3. Left atrial size was mildly dilated.  4. The mitral valve is normal in structure. No evidence of mitral valve  regurgitation. No evidence of mitral stenosis.  5. The aortic valve is normal in structure. Aortic valve regurgitation is  not visualized. No aortic stenosis is present.  6. Aortic dilatation noted. There is mild dilatation of the ascending  aorta measuring 38 mm.  7. The inferior vena cava is dilated in size with >50% respiratory  variability,  suggesting right atrial pressure of 8 mmHg.    Recent Labs: 07/14/2019: B Natriuretic Peptide 329.2 07/15/2019: Magnesium 2.4; TSH 3.422 07/16/2019: ALT 17 07/18/2019: Hemoglobin 9.2; Platelets 139 07/20/2019: BUN 35; Creatinine, Ser 0.99; Potassium 4.4; Sodium 144  Recent Lipid Panel    Component Value  Date/Time   CHOL 181 08/24/2015 0422   TRIG 100 08/24/2015 0422   HDL 43 08/24/2015 0422   CHOLHDL 4.2 08/24/2015 0422   VLDL 20 08/24/2015 0422   LDLCALC 118 (H) 08/24/2015 0422    Physical Exam:    VS:  BP (!) 134/58   Pulse 76   Ht 5' 3.5" (1.613 m)   Wt (!) 290 lb 1.3 oz (131.6 kg)   LMP  (LMP Unknown)   SpO2 100%   BMI 50.58 kg/m     Wt Readings from Last 3 Encounters:  07/30/19 (!) 290 lb 1.3 oz (131.6 kg)  07/21/19 287 lb 1.6 oz (130.2 kg)  02/13/19 280 lb 12.8 oz (127.4 kg)     GEN: Patient is in no acute distress HEENT: Normal NECK: No JVD; No carotid bruits LYMPHATICS: No lymphadenopathy CARDIAC: Hear sounds regular, 2/6 systolic murmur at the apex. RESPIRATORY:  Clear to auscultation without rales, wheezing or rhonchi  ABDOMEN: Soft, non-tender, non-distended MUSCULOSKELETAL:  No edema; No deformity  SKIN: Warm and dry NEUROLOGIC:  Alert and oriented x 3 PSYCHIATRIC:  Normal affect   Signed, Jenean Lindau, MD  07/30/2019 10:51 AM    [place]

## 2019-07-31 LAB — BASIC METABOLIC PANEL
BUN/Creatinine Ratio: 43 — ABNORMAL HIGH (ref 12–28)
BUN: 46 mg/dL — ABNORMAL HIGH (ref 8–27)
CO2: 32 mmol/L — ABNORMAL HIGH (ref 20–29)
Calcium: 10.7 mg/dL — ABNORMAL HIGH (ref 8.7–10.3)
Chloride: 96 mmol/L (ref 96–106)
Creatinine, Ser: 1.07 mg/dL — ABNORMAL HIGH (ref 0.57–1.00)
GFR calc Af Amer: 61 mL/min/{1.73_m2} (ref >59)
GFR calc non Af Amer: 53 mL/min/{1.73_m2} — ABNORMAL LOW (ref >59)
Glucose: 94 mg/dL (ref 65–99)
Potassium: 4.3 mmol/L (ref 3.5–5.2)
Sodium: 142 mmol/L (ref 134–144)

## 2019-08-04 DIAGNOSIS — M79671 Pain in right foot: Secondary | ICD-10-CM | POA: Diagnosis not present

## 2019-08-04 DIAGNOSIS — E1159 Type 2 diabetes mellitus with other circulatory complications: Secondary | ICD-10-CM | POA: Diagnosis not present

## 2019-08-04 DIAGNOSIS — L84 Corns and callosities: Secondary | ICD-10-CM | POA: Diagnosis not present

## 2019-08-04 DIAGNOSIS — M79672 Pain in left foot: Secondary | ICD-10-CM | POA: Diagnosis not present

## 2019-08-04 DIAGNOSIS — B351 Tinea unguium: Secondary | ICD-10-CM | POA: Diagnosis not present

## 2019-08-11 IMAGING — US US EXTREM LOW VENOUS BILAT
1 series · 13 of 24 positions shown · non-contrast
Comparison: None.

CLINICAL DATA: Bilateral leg pain for 1 week, now with swelling.
Left is worse than right.



[Series 1: us extrem low venous bilat · 0.07mm/px · 13 of 58 slices shown]
[im 1/58]
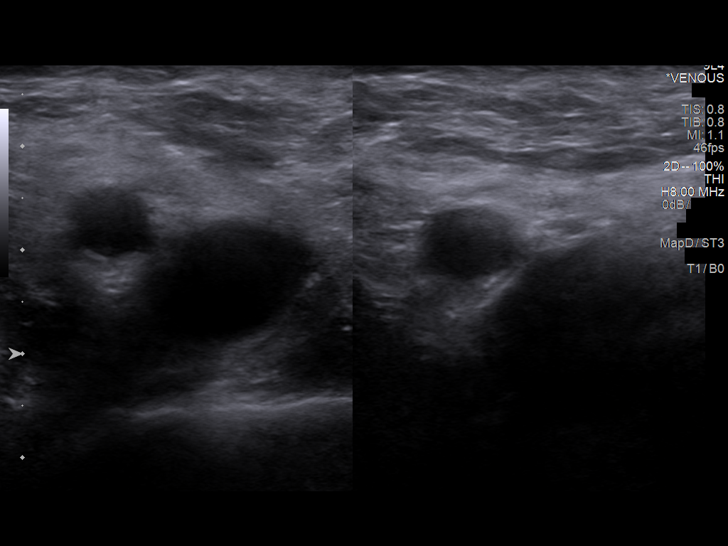
[im 5/58]
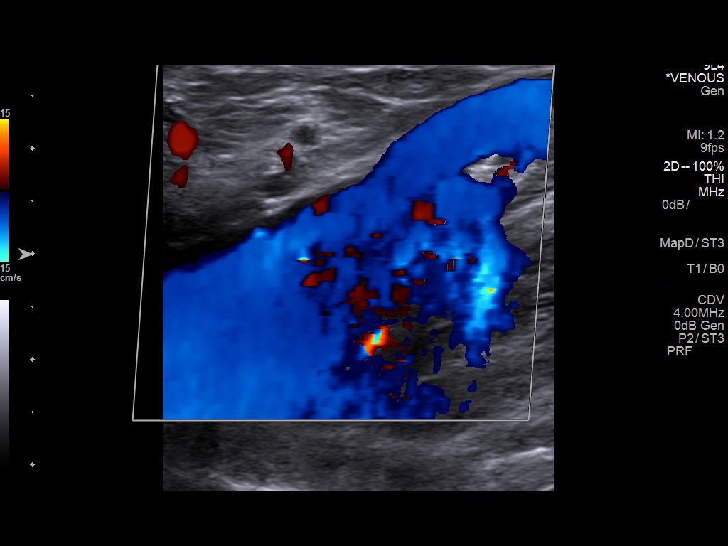
[im 10/58]
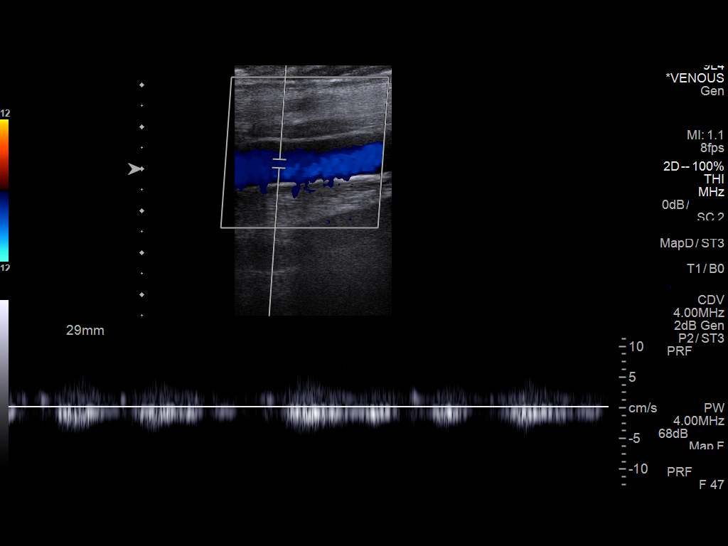
[im 15/58]
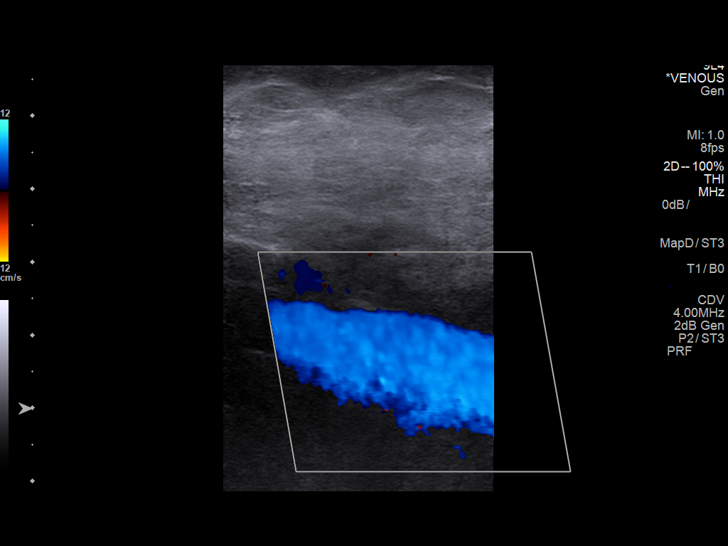
[im 20/58]
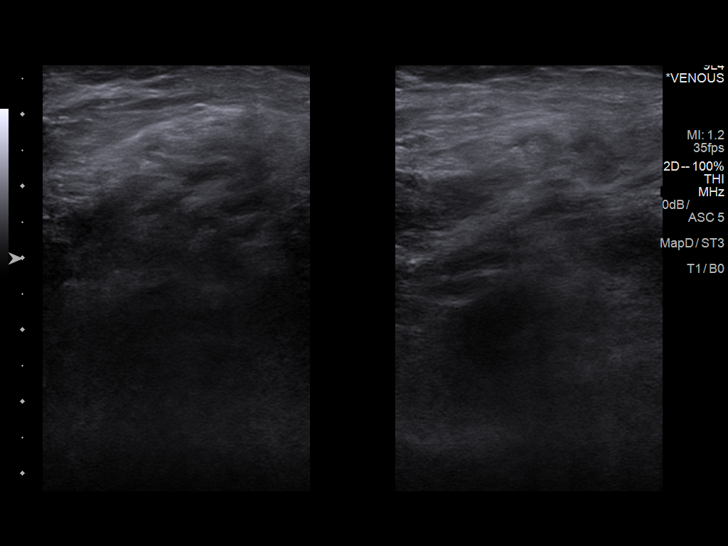
[im 25/58]
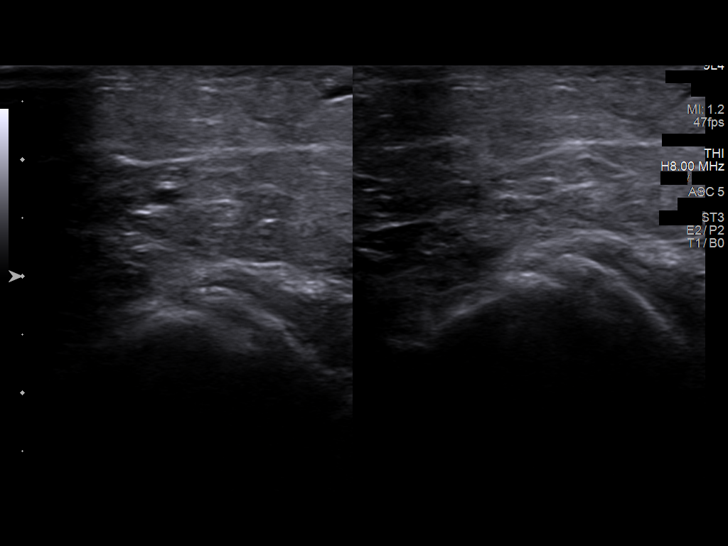
[im 30/58]
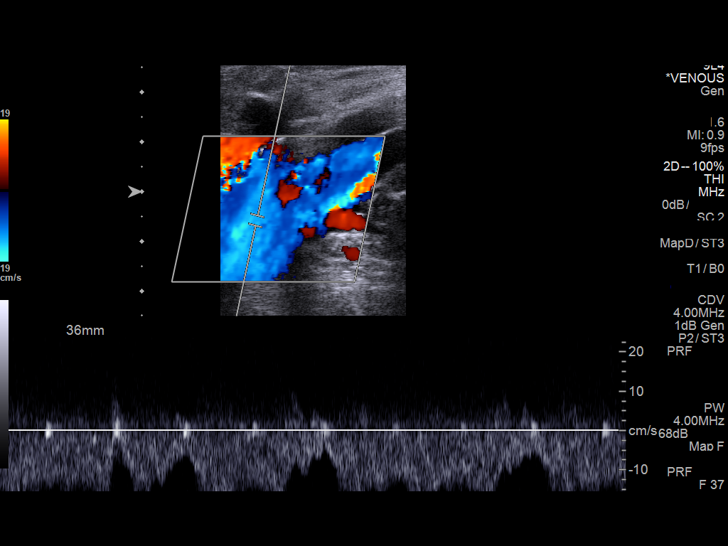
[im 33/58]
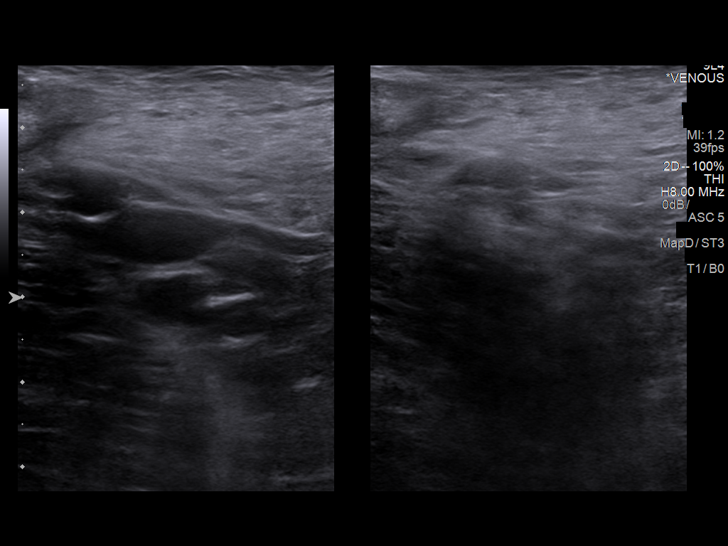
[im 38/58]
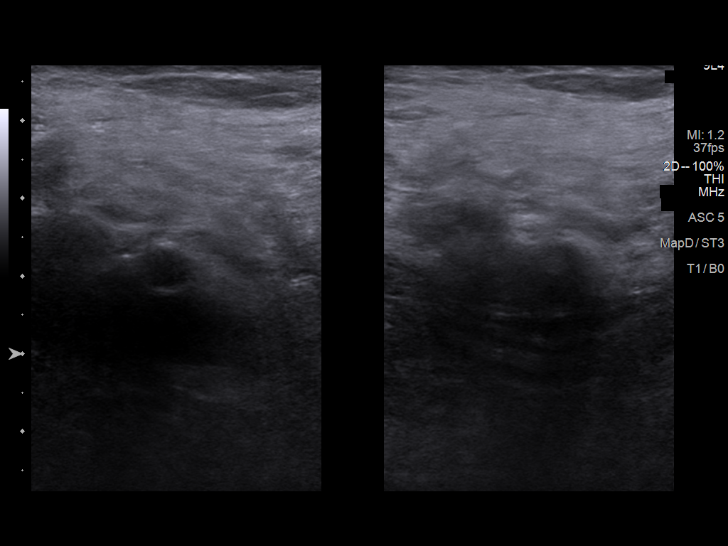
[im 43/58]
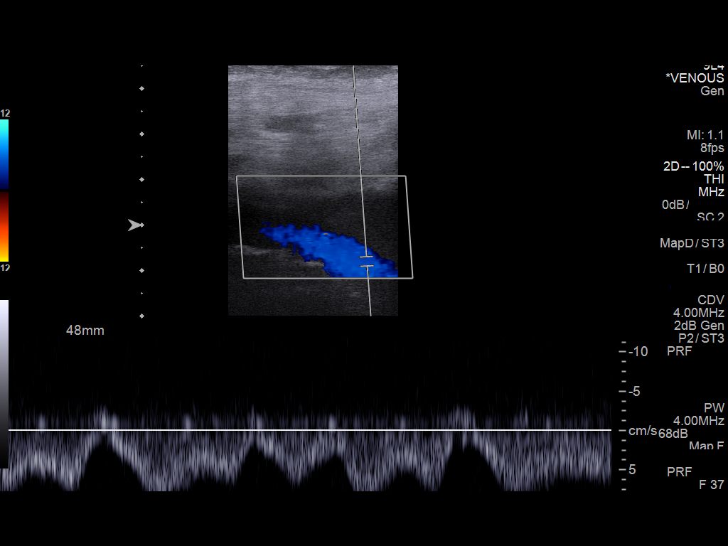
[im 48/58]
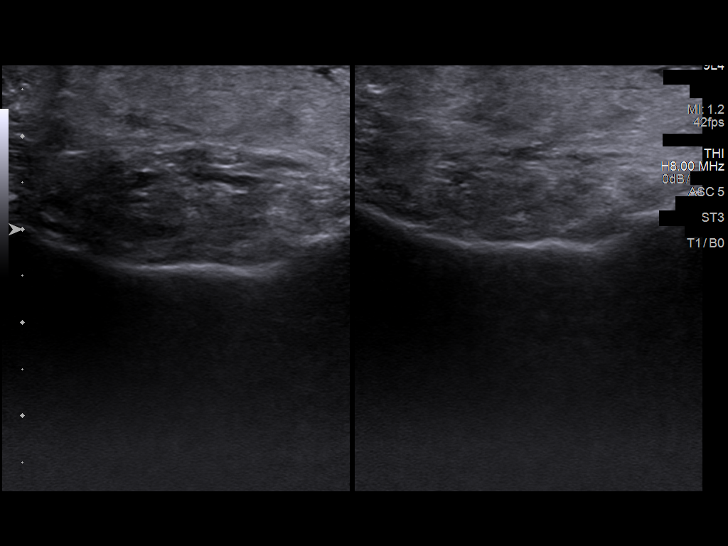
[im 53/58]
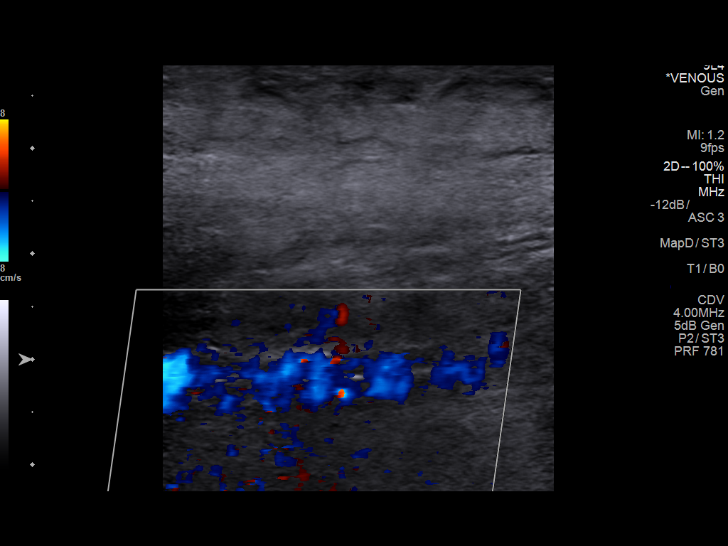
[im 58/58]
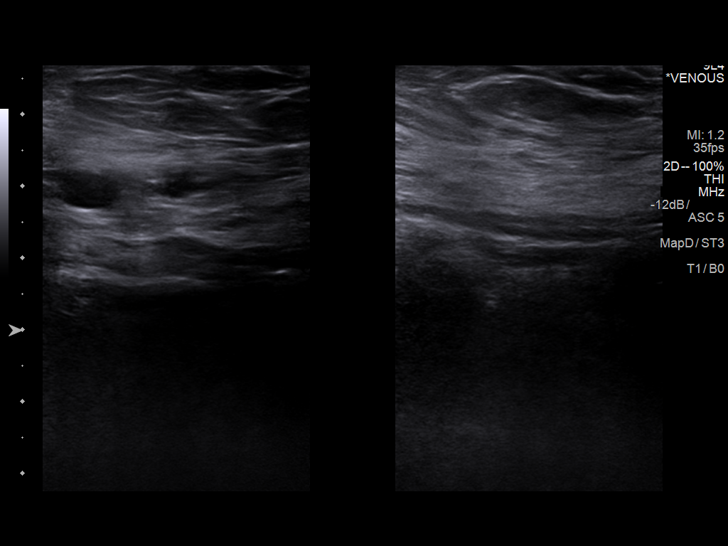

[13 of 24 positions shown; findings below may reference images not displayed]

FINDINGS: RIGHT LOWER EXTREMITY

Common Femoral Vein: No evidence of thrombus. Normal
compressibility, respiratory phasicity and response to augmentation.

Saphenofemoral Junction: No evidence of thrombus. Normal
compressibility and flow on color Doppler imaging.

Profunda Femoral Vein: No evidence of thrombus. Normal
compressibility and flow on color Doppler imaging.

Femoral Vein: No evidence of thrombus. Normal compressibility,
respiratory phasicity and response to augmentation.

Popliteal Vein: No evidence of thrombus. Normal compressibility,
respiratory phasicity and response to augmentation.

Calf Veins: No evidence of thrombus. Normal compressibility and flow
on color Doppler imaging.

Superficial Great Saphenous Vein: No evidence of thrombus. Normal
compressibility.

Venous Reflux:  None.

Other Findings:  None.

LEFT LOWER EXTREMITY

Common Femoral Vein: No evidence of thrombus. Normal
compressibility, respiratory phasicity and response to augmentation.

Saphenofemoral Junction: No evidence of thrombus. Normal
compressibility and flow on color Doppler imaging.

Profunda Femoral Vein: No evidence of thrombus. Normal
compressibility and flow on color Doppler imaging.

Femoral Vein: No evidence of thrombus. Normal compressibility,
respiratory phasicity and response to augmentation.

Popliteal Vein: No evidence of thrombus. Normal compressibility,
respiratory phasicity and response to augmentation.

Calf Veins: No evidence of thrombus. Normal compressibility and flow
on color Doppler imaging.

Superficial Great Saphenous Vein: No evidence of thrombus. Normal
compressibility.

Venous Reflux:  None.

Other Findings:  None.
IMPRESSION: No evidence of deep venous thrombosis.

## 2019-08-19 DIAGNOSIS — J449 Chronic obstructive pulmonary disease, unspecified: Secondary | ICD-10-CM | POA: Diagnosis not present

## 2019-08-19 DIAGNOSIS — J45909 Unspecified asthma, uncomplicated: Secondary | ICD-10-CM | POA: Diagnosis not present

## 2019-08-28 DIAGNOSIS — J449 Chronic obstructive pulmonary disease, unspecified: Secondary | ICD-10-CM | POA: Diagnosis not present

## 2019-09-28 DIAGNOSIS — J449 Chronic obstructive pulmonary disease, unspecified: Secondary | ICD-10-CM | POA: Diagnosis not present

## 2019-10-02 DIAGNOSIS — Z23 Encounter for immunization: Secondary | ICD-10-CM | POA: Diagnosis not present

## 2019-10-02 DIAGNOSIS — I2781 Cor pulmonale (chronic): Secondary | ICD-10-CM | POA: Diagnosis not present

## 2019-10-02 DIAGNOSIS — R0602 Shortness of breath: Secondary | ICD-10-CM | POA: Diagnosis not present

## 2019-10-07 DIAGNOSIS — M79671 Pain in right foot: Secondary | ICD-10-CM | POA: Diagnosis not present

## 2019-10-07 DIAGNOSIS — L84 Corns and callosities: Secondary | ICD-10-CM | POA: Diagnosis not present

## 2019-10-07 DIAGNOSIS — M79672 Pain in left foot: Secondary | ICD-10-CM | POA: Diagnosis not present

## 2019-10-07 DIAGNOSIS — B351 Tinea unguium: Secondary | ICD-10-CM | POA: Diagnosis not present

## 2019-10-07 DIAGNOSIS — E1159 Type 2 diabetes mellitus with other circulatory complications: Secondary | ICD-10-CM | POA: Diagnosis not present

## 2019-10-28 DIAGNOSIS — J449 Chronic obstructive pulmonary disease, unspecified: Secondary | ICD-10-CM | POA: Diagnosis not present

## 2019-11-07 ENCOUNTER — Other Ambulatory Visit: Payer: Self-pay

## 2019-11-10 ENCOUNTER — Encounter: Payer: Self-pay | Admitting: Cardiology

## 2019-11-10 ENCOUNTER — Other Ambulatory Visit: Payer: Self-pay

## 2019-11-10 ENCOUNTER — Ambulatory Visit (INDEPENDENT_AMBULATORY_CARE_PROVIDER_SITE_OTHER): Payer: Medicare HMO | Admitting: Cardiology

## 2019-11-10 VITALS — BP 153/80 | HR 70 | Ht 63.5 in | Wt 289.0 lb

## 2019-11-10 DIAGNOSIS — I10 Essential (primary) hypertension: Secondary | ICD-10-CM | POA: Diagnosis not present

## 2019-11-10 DIAGNOSIS — I2510 Atherosclerotic heart disease of native coronary artery without angina pectoris: Secondary | ICD-10-CM | POA: Diagnosis not present

## 2019-11-10 DIAGNOSIS — I5032 Chronic diastolic (congestive) heart failure: Secondary | ICD-10-CM | POA: Diagnosis not present

## 2019-11-10 DIAGNOSIS — E7800 Pure hypercholesterolemia, unspecified: Secondary | ICD-10-CM | POA: Diagnosis not present

## 2019-11-10 DIAGNOSIS — N189 Chronic kidney disease, unspecified: Secondary | ICD-10-CM

## 2019-11-10 DIAGNOSIS — I421 Obstructive hypertrophic cardiomyopathy: Secondary | ICD-10-CM | POA: Diagnosis not present

## 2019-11-10 NOTE — Progress Notes (Signed)
Cardiology Office Note:    Date:  11/10/2019   ID:  Morgan Fischer, DOB 07-Sep-1950, MRN [id number]  PCP:  Benito Mccreedy, MD  Cardiologist:  Jenean Lindau, MD   Referring MD: Benito Mccreedy, MD    ASSESSMENT:    1. Primary hypertension   2. Atherosclerosis of native coronary artery of native heart without angina pectoris   3. HOCM (hypertrophic obstructive cardiomyopathy) ([place])   4. Chronic diastolic heart failure ([place])   5. Essential (primary) hypertension   6. High cholesterol   7. Chronic kidney disease, unspecified CKD stage    PLAN:    In order of problems listed above:  1. I discussed my findings with the patient in extensive length.  Primary prevention stressed.  Importance of compliance with diet medication stressed and she vocalized understanding.  Congestive heart failure education was given including diet, salt intake issues and regular weight checks.  She promises to do so. 2. Hypertrophic cardiomyopathy: I discussed at length she prefers not to have any further evaluation in view of her comorbidities.  She tells me that she is asymptomatic and understands the risks of sudden cardiac death.  I respect her wishes. 3. Essential hypertension: Blood pressure is better at home according to the patient.  Told her to keep a written track of it in a log and get it to me next time. 4. Diastolic congestive heart failure: Advice was given as mentioned above and she promises to take good care of herself. 5. Morbid obesity and COPD: Weight reduction was stressed risks of obesity explained and she vocalized understanding and he promises to do better. 6. Patient will be seen in follow-up appointment in 4 months or earlier if the patient has any concerns    Medication Adjustments/Labs and Tests Ordered: Current medicines are reviewed at length with the patient today.  Concerns regarding medicines are outlined above.  Orders Placed This Encounter  Procedures  . Basic  metabolic panel   No orders of the defined types were placed in this encounter.    No chief complaint on file.    History of Present Illness:    Morgan Fischer is a 69 y.o. female.  Patient has past medical history of essential hypertension hypertrophic cardiomyopathy congestive heart failure of diastolic type and morbid obesity.  She denies any problems at this time and takes care of activities of daily living.  No chest pain orthopnea or PND.  She has essentially wheelchair-bound and has COPD and uses oxygen all the time.  At the time of my evaluation, the patient is alert awake oriented and in no distress.  Past Medical History:  Diagnosis Date  . Acute diastolic heart failure ([place]) 07/17/2019  . Acute hypercapnic respiratory failure ([place]) 07/21/2016  . Acute on chronic congestive heart failure ([place]) 07/19/2016  . Acute on chronic respiratory failure with hypoxia ([place]) 10/20/2018  . Acute respiratory acidosis 07/19/2016  . Acute respiratory distress 07/19/2016  . Aortic stenosis 11/01/2018  . Arthritis   . Asthma   . Atherosclerosis of native coronary artery of native heart without angina pectoris 03/17/2016  . Chest pain 07/14/2019  . CHF (congestive heart failure) ([place])   . Chronic diastolic heart failure ([place]) 03/17/2016  . Chronic hypoxemic respiratory failure ([place])   . CKD (chronic kidney disease)   . COPD (chronic obstructive pulmonary disease) ([place])   . Diabetes mellitus without complication ([place])   . Drug allergy 07/21/2016  . Essential (primary) hypertension 11/19/2016  . Gout 11/19/2016  .  Heart block AV complete ([place])   . High cholesterol   . HOCM (hypertrophic obstructive cardiomyopathy) ([place]) 09/18/2017  . Hyperglycemia due to type 2 diabetes mellitus ([place]) 07/19/2016  . Hypertension   . Hypertensive heart disease with heart failure ([place]) 03/17/2016  . Hypokalemia 08/24/2015  . Hypomagnesemia   . Hypoxia 03/17/2016  . Leukocytosis 07/19/2016  . Morbid obesity ([place])   .  Nonrheumatic aortic valve stenosis 03/17/2016  . Obstructive sleep apnea 11/19/2016  . Panniculitis 08/23/2015  . Pure hypercholesterolemia 02/13/2017  . SOB (shortness of breath) 08/24/2015  . Troponin level elevated 08/24/2015  . Type 2 diabetes mellitus, without long-term current use of insulin ([place]) 11/19/2016    Past Surgical History:  Procedure Laterality Date  . ABDOMINAL HYSTERECTOMY    . CESAREAN SECTION      Current Medications: Current Meds  Medication Sig  . allopurinol (ZYLOPRIM) 300 MG tablet Take 300 mg by mouth daily.  Marland Kitchen amLODipine (NORVASC) 5 MG tablet Take 5 mg by mouth daily.  Marland Kitchen aspirin EC 81 MG EC tablet Take 1 tablet (81 mg total) by mouth daily.  . BD PEN NEEDLE NANO 2ND GEN 32G X 4 MM MISC 1 each by Other route as directed.   . Blood Glucose Monitoring Suppl (ACCU-CHEK AVIVA PLUS) w/Device KIT 1 each by Other route as directed.   Marland Kitchen BREO ELLIPTA 100-25 MCG/INH AEPB Inhale 1 puff into the lungs daily.   . Calcium Carb-Cholecalciferol (CALCIUM-VITAMIN D3) 500-400 MG-UNIT TABS Take 1 tablet by mouth 2 (two) times daily.   . Cyanocobalamin (VITAMIN B-12 PO) Take 1 tablet by mouth daily.  . ferrous sulfate 325 (65 FE) MG EC tablet Take 1 tablet by mouth daily.  . Garlic 3299 MG CAPS Take 1,000 mg by mouth daily.  Marland Kitchen glimepiride (AMARYL) 4 MG tablet Take 4 mg by mouth 2 (two) times daily.  Marland Kitchen ipratropium-albuterol (DUONEB) 0.5-2.5 (3) MG/3ML SOLN Inhale 3 mLs into the lungs every 4 (four) hours as needed (breathing.).   Marland Kitchen LANTUS SOLOSTAR 100 UNIT/ML Solostar Pen Inject 10 Units into the skin daily.   Marland Kitchen losartan (COZAAR) 100 MG tablet Take 1 tablet by mouth daily.  . metFORMIN (GLUCOPHAGE) 500 MG tablet Take by mouth 2 (two) times daily with a meal.  . metoprolol succinate (TOPROL-XL) 25 MG 24 hr tablet Take 25 mg by mouth daily.  . metoprolol succinate (TOPROL-XL) 50 MG 24 hr tablet Take 50 mg by mouth daily.  . montelukast (SINGULAIR) 10 MG tablet Take 10 mg by mouth at  bedtime.  . Multiple Vitamin (MULTIVITAMIN WITH MINERALS) TABS tablet Take 1 tablet by mouth daily.  . nitroGLYCERIN (NITROSTAT) 0.4 MG SL tablet Place 1 tablet (0.4 mg total) under the tongue every 5 (five) minutes as needed for chest pain.  . Omega-3 Fatty Acids (FISH OIL) 1000 MG CAPS Take 1,000 mg by mouth 2 (two) times daily.   . OXYGEN Inhale 2 L into the lungs.   . polyethylene glycol (MIRALAX / GLYCOLAX) 17 g packet Take 17 g by mouth daily.  . potassium chloride (KLOR-CON) 10 MEQ tablet Take 10 mEq by mouth 2 (two) times daily.  . rosuvastatin (CRESTOR) 40 MG tablet Take 40 mg by mouth daily.  . Turmeric (QC TUMERIC COMPLEX) 500 MG CAPS Take 1 capsule by mouth daily.  . VENTOLIN HFA 108 (90 Base) MCG/ACT inhaler Inhale 2 puffs into the lungs every 6 (six) hours as needed for wheezing or shortness of breath.  Allergies:   Atorvastatin, Ciprocinonide [fluocinolone], Catapres [clonidine hcl], and Demadex [torsemide]   Social History   Socioeconomic History  . Marital status: Widowed    Spouse name: Not on file  . Number of children: Not on file  . Years of education: Not on file  . Highest education level: Not on file  Occupational History  . Not on file  Tobacco Use  . Smoking status: Never Smoker  . Smokeless tobacco: Never Used  Vaping Use  . Vaping Use: Never used  Substance and Sexual Activity  . Alcohol use: No  . Drug use: No  . Sexual activity: Never  Other Topics Concern  . Not on file  Social History Narrative  . Not on file   Social Determinants of Health   Financial Resource Strain:   . Difficulty of Paying Living Expenses: Not on file  Food Insecurity:   . Worried About Charity fundraiser in the Last Year: Not on file  . Ran Out of Food in the Last Year: Not on file  Transportation Needs:   . Lack of Transportation (Medical): Not on file  . Lack of Transportation (Non-Medical): Not on file  Physical Activity:   . Days of Exercise per Week: Not  on file  . Minutes of Exercise per Session: Not on file  Stress:   . Feeling of Stress : Not on file  Social Connections:   . Frequency of Communication with Friends and Family: Not on file  . Frequency of Social Gatherings with Friends and Family: Not on file  . Attends Religious Services: Not on file  . Active Member of Clubs or Organizations: Not on file  . Attends Archivist Meetings: Not on file  . Marital Status: Not on file     Family History: The patient's family history includes Breast cancer in her sister; CAD in her mother; Cancer in her father and sister; Diabetes in her mother; Diabetes Mellitus I in her mother; Heart disease in her father.  ROS:   Please see the history of present illness.    All other systems reviewed and are negative.  EKGs/Labs/Other Studies Reviewed:    The following studies were reviewed today: IMPRESSIONS    1. Severe intracavitary gradient. Peak velocity 4.67 m/s. Peak gradient  87.2 mmHg. Systolic anterior motion of the mitral valve. Findings  consistent with HOCM. Left ventricular ejection fraction, by estimation,  is 60 to 65%. The left ventricle has  normal function. The left ventricle has no regional wall motion  abnormalities. There is severe concentric left ventricular hypertrophy.  Left ventricular diastolic parameters are consistent with Grade II  diastolic dysfunction (pseudonormalization).  2. Right ventricular systolic function is normal. The right ventricular  size is normal.  3. Left atrial size was mildly dilated.  4. The mitral valve is normal in structure. No evidence of mitral valve  regurgitation. No evidence of mitral stenosis.  5. The aortic valve is normal in structure. Aortic valve regurgitation is  not visualized. No aortic stenosis is present.  6. Aortic dilatation noted. There is mild dilatation of the ascending  aorta measuring 38 mm.  7. The inferior vena cava is dilated in size with >50%  respiratory  variability, suggesting right atrial pressure of 8 mmHg.    Recent Labs: 07/14/2019: B Natriuretic Peptide 329.2 07/15/2019: Magnesium 2.4; TSH 3.422 07/16/2019: ALT 17 07/18/2019: Hemoglobin 9.2; Platelets 139 07/30/2019: BUN 46; Creatinine, Ser 1.07; Potassium 4.3; Sodium 142  Recent Lipid Panel  Component Value Date/Time   CHOL 181 08/24/2015 0422   TRIG 100 08/24/2015 0422   HDL 43 08/24/2015 0422   CHOLHDL 4.2 08/24/2015 0422   VLDL 20 08/24/2015 0422   LDLCALC 118 (H) 08/24/2015 0422    Physical Exam:    VS:  BP (!) 153/80   Pulse 70   Ht 5' 3.5" (1.613 m)   Wt 289 lb 0.6 oz (131.1 kg)   LMP  (LMP Unknown)   SpO2 100%   BMI 50.40 kg/m     Wt Readings from Last 3 Encounters:  11/10/19 289 lb 0.6 oz (131.1 kg)  07/30/19 (!) 290 lb 1.3 oz (131.6 kg)  07/21/19 287 lb 1.6 oz (130.2 kg)     GEN: Patient is in no acute distress HEENT: Normal NECK: No JVD; No carotid bruits LYMPHATICS: No lymphadenopathy CARDIAC: Hear sounds regular, 2/6 systolic murmur at the apex. RESPIRATORY:  Clear to auscultation without rales, wheezing or rhonchi  ABDOMEN: Soft, non-tender, non-distended MUSCULOSKELETAL:  No edema; No deformity  SKIN: Warm and dry NEUROLOGIC:  Alert and oriented x 3 PSYCHIATRIC:  Normal affect   Signed, Jenean Lindau, MD  11/10/2019 10:36 AM    [place]

## 2019-11-10 NOTE — Patient Instructions (Signed)
Medication Instructions:  No medication changes. *If you need a refill on your cardiac medications before your next appointment, please call your pharmacy*   Lab Work: Your physician recommends that you have a BMET done today in the office.  If you have labs (blood work) drawn today and your tests are completely normal, you will receive your results only by: . MyChart Message (if you have MyChart) OR . A paper copy in the mail If you have any lab test that is abnormal or we need to change your treatment, we will call you to review the results.   Testing/Procedures: None ordered   Follow-Up: At [facility], you and your health needs are our priority.  As part of our continuing mission to provide you with exceptional heart care, we have created designated Provider Care Teams.  These Care Teams include your primary Cardiologist (physician) and Advanced Practice Providers (APPs -  Physician Assistants and Nurse Practitioners) who all work together to provide you with the care you need, when you need it.  We recommend signing up for the patient portal called "MyChart".  Sign up information is provided on this After Visit Summary.  MyChart is used to connect with patients for Virtual Visits (Telemedicine).  Patients are able to view lab/test results, encounter notes, upcoming appointments, etc.  Non-urgent messages can be sent to your provider as well.   To learn more about what you can do with MyChart, go to [email or web address].    Your next appointment:   4 month(s)  The format for your next appointment:   In Person  Provider:   Rajan Revankar, MD   Other Instructions NA  

## 2019-11-11 LAB — BASIC METABOLIC PANEL
BUN/Creatinine Ratio: 35 — ABNORMAL HIGH (ref 12–28)
BUN: 33 mg/dL — ABNORMAL HIGH (ref 8–27)
CO2: 30 mmol/L — ABNORMAL HIGH (ref 20–29)
Calcium: 10 mg/dL (ref 8.7–10.3)
Chloride: 96 mmol/L (ref 96–106)
Creatinine, Ser: 0.94 mg/dL (ref 0.57–1.00)
GFR calc Af Amer: 71 mL/min/{1.73_m2} (ref >59)
GFR calc non Af Amer: 61 mL/min/{1.73_m2} (ref >59)
Glucose: 168 mg/dL — ABNORMAL HIGH (ref 65–99)
Potassium: 3.7 mmol/L (ref 3.5–5.2)
Sodium: 142 mmol/L (ref 134–144)

## 2019-11-18 DIAGNOSIS — J449 Chronic obstructive pulmonary disease, unspecified: Secondary | ICD-10-CM | POA: Diagnosis not present

## 2019-11-18 DIAGNOSIS — J45909 Unspecified asthma, uncomplicated: Secondary | ICD-10-CM | POA: Diagnosis not present

## 2019-11-28 DIAGNOSIS — J449 Chronic obstructive pulmonary disease, unspecified: Secondary | ICD-10-CM | POA: Diagnosis not present

## 2019-12-10 DIAGNOSIS — E1159 Type 2 diabetes mellitus with other circulatory complications: Secondary | ICD-10-CM | POA: Diagnosis not present

## 2019-12-10 DIAGNOSIS — B351 Tinea unguium: Secondary | ICD-10-CM | POA: Diagnosis not present

## 2019-12-10 DIAGNOSIS — L84 Corns and callosities: Secondary | ICD-10-CM | POA: Diagnosis not present

## 2019-12-10 DIAGNOSIS — M79672 Pain in left foot: Secondary | ICD-10-CM | POA: Diagnosis not present

## 2019-12-10 DIAGNOSIS — M79671 Pain in right foot: Secondary | ICD-10-CM | POA: Diagnosis not present

## 2019-12-28 DIAGNOSIS — J449 Chronic obstructive pulmonary disease, unspecified: Secondary | ICD-10-CM | POA: Diagnosis not present

## 2020-01-12 DIAGNOSIS — J449 Chronic obstructive pulmonary disease, unspecified: Secondary | ICD-10-CM | POA: Diagnosis not present

## 2020-01-12 DIAGNOSIS — I10 Essential (primary) hypertension: Secondary | ICD-10-CM | POA: Diagnosis not present

## 2020-01-12 DIAGNOSIS — J45909 Unspecified asthma, uncomplicated: Secondary | ICD-10-CM | POA: Diagnosis not present

## 2020-01-12 DIAGNOSIS — I2781 Cor pulmonale (chronic): Secondary | ICD-10-CM | POA: Diagnosis not present

## 2020-01-28 DIAGNOSIS — J449 Chronic obstructive pulmonary disease, unspecified: Secondary | ICD-10-CM | POA: Diagnosis not present

## 2020-02-10 MED ORDER — METOPROLOL SUCCINATE ER 25 MG PO TB24: 25.0000 mg | ORAL_TABLET | Freq: Every day | ORAL | 2 refills | Status: DC

## 2020-02-10 MED ORDER — METOPROLOL SUCCINATE ER 50 MG PO TB24: 50.0000 mg | ORAL_TABLET | Freq: Every day | ORAL | 2 refills | Status: DC

## 2020-02-18 DIAGNOSIS — J45909 Unspecified asthma, uncomplicated: Secondary | ICD-10-CM | POA: Diagnosis not present

## 2020-02-18 DIAGNOSIS — J449 Chronic obstructive pulmonary disease, unspecified: Secondary | ICD-10-CM | POA: Diagnosis not present

## 2020-02-23 DIAGNOSIS — I10 Essential (primary) hypertension: Secondary | ICD-10-CM | POA: Diagnosis not present

## 2020-02-23 DIAGNOSIS — I119 Hypertensive heart disease without heart failure: Secondary | ICD-10-CM | POA: Diagnosis not present

## 2020-02-23 DIAGNOSIS — E782 Mixed hyperlipidemia: Secondary | ICD-10-CM | POA: Diagnosis not present

## 2020-02-23 DIAGNOSIS — F419 Anxiety disorder, unspecified: Secondary | ICD-10-CM | POA: Diagnosis not present

## 2020-02-23 DIAGNOSIS — E611 Iron deficiency: Secondary | ICD-10-CM | POA: Diagnosis not present

## 2020-02-23 DIAGNOSIS — I872 Venous insufficiency (chronic) (peripheral): Secondary | ICD-10-CM | POA: Diagnosis not present

## 2020-02-23 DIAGNOSIS — J45909 Unspecified asthma, uncomplicated: Secondary | ICD-10-CM | POA: Diagnosis not present

## 2020-02-23 DIAGNOSIS — E1165 Type 2 diabetes mellitus with hyperglycemia: Secondary | ICD-10-CM | POA: Diagnosis not present

## 2020-02-28 DIAGNOSIS — J449 Chronic obstructive pulmonary disease, unspecified: Secondary | ICD-10-CM | POA: Diagnosis not present

## 2020-03-08 ENCOUNTER — Other Ambulatory Visit: Payer: Self-pay

## 2020-03-08 ENCOUNTER — Telehealth: Payer: Self-pay

## 2020-03-08 ENCOUNTER — Emergency Department (HOSPITAL_BASED_OUTPATIENT_CLINIC_OR_DEPARTMENT_OTHER): Payer: Medicare HMO

## 2020-03-08 ENCOUNTER — Inpatient Hospital Stay (HOSPITAL_BASED_OUTPATIENT_CLINIC_OR_DEPARTMENT_OTHER)
Admission: EM | Admit: 2020-03-08 | Discharge: 2020-03-12 | DRG: 291 | Disposition: A | Discharge status: Home Health | Payer: Medicare HMO | Attending: Internal Medicine | Admitting: Internal Medicine

## 2020-03-08 ENCOUNTER — Encounter (HOSPITAL_BASED_OUTPATIENT_CLINIC_OR_DEPARTMENT_OTHER): Payer: Self-pay | Admitting: *Deleted

## 2020-03-08 DIAGNOSIS — F40240 Claustrophobia: Secondary | ICD-10-CM | POA: Diagnosis present

## 2020-03-08 DIAGNOSIS — Z888 Allergy status to other drugs, medicaments and biological substances status: Secondary | ICD-10-CM

## 2020-03-08 DIAGNOSIS — E7800 Pure hypercholesterolemia, unspecified: Secondary | ICD-10-CM | POA: Diagnosis not present

## 2020-03-08 DIAGNOSIS — N189 Chronic kidney disease, unspecified: Secondary | ICD-10-CM | POA: Diagnosis present

## 2020-03-08 DIAGNOSIS — Z9981 Dependence on supplemental oxygen: Secondary | ICD-10-CM | POA: Diagnosis not present

## 2020-03-08 DIAGNOSIS — R778 Other specified abnormalities of plasma proteins: Secondary | ICD-10-CM

## 2020-03-08 DIAGNOSIS — I10 Essential (primary) hypertension: Secondary | ICD-10-CM | POA: Diagnosis present

## 2020-03-08 DIAGNOSIS — E785 Hyperlipidemia, unspecified: Secondary | ICD-10-CM | POA: Diagnosis present

## 2020-03-08 DIAGNOSIS — E876 Hypokalemia: Secondary | ICD-10-CM | POA: Diagnosis present

## 2020-03-08 DIAGNOSIS — E119 Type 2 diabetes mellitus without complications: Secondary | ICD-10-CM

## 2020-03-08 DIAGNOSIS — N183 Chronic kidney disease, stage 3 unspecified: Secondary | ICD-10-CM

## 2020-03-08 DIAGNOSIS — I248 Other forms of acute ischemic heart disease: Secondary | ICD-10-CM | POA: Diagnosis not present

## 2020-03-08 DIAGNOSIS — J9601 Acute respiratory failure with hypoxia: Secondary | ICD-10-CM | POA: Diagnosis not present

## 2020-03-08 DIAGNOSIS — M109 Gout, unspecified: Secondary | ICD-10-CM | POA: Diagnosis present

## 2020-03-08 DIAGNOSIS — I5023 Acute on chronic systolic (congestive) heart failure: Secondary | ICD-10-CM

## 2020-03-08 DIAGNOSIS — J9621 Acute and chronic respiratory failure with hypoxia: Secondary | ICD-10-CM | POA: Diagnosis present

## 2020-03-08 DIAGNOSIS — G4733 Obstructive sleep apnea (adult) (pediatric): Secondary | ICD-10-CM | POA: Diagnosis present

## 2020-03-08 DIAGNOSIS — J9600 Acute respiratory failure, unspecified whether with hypoxia or hypercapnia: Secondary | ICD-10-CM

## 2020-03-08 DIAGNOSIS — Z20822 Contact with and (suspected) exposure to covid-19: Secondary | ICD-10-CM | POA: Diagnosis not present

## 2020-03-08 DIAGNOSIS — I2721 Secondary pulmonary arterial hypertension: Secondary | ICD-10-CM | POA: Diagnosis present

## 2020-03-08 DIAGNOSIS — N182 Chronic kidney disease, stage 2 (mild): Secondary | ICD-10-CM | POA: Diagnosis present

## 2020-03-08 DIAGNOSIS — Z8249 Family history of ischemic heart disease and other diseases of the circulatory system: Secondary | ICD-10-CM

## 2020-03-08 DIAGNOSIS — J9622 Acute and chronic respiratory failure with hypercapnia: Secondary | ICD-10-CM

## 2020-03-08 DIAGNOSIS — I2510 Atherosclerotic heart disease of native coronary artery without angina pectoris: Secondary | ICD-10-CM | POA: Diagnosis present

## 2020-03-08 DIAGNOSIS — E1122 Type 2 diabetes mellitus with diabetic chronic kidney disease: Secondary | ICD-10-CM | POA: Diagnosis present

## 2020-03-08 DIAGNOSIS — I712 Thoracic aortic aneurysm, without rupture: Secondary | ICD-10-CM | POA: Diagnosis present

## 2020-03-08 DIAGNOSIS — Z7982 Long term (current) use of aspirin: Secondary | ICD-10-CM | POA: Diagnosis not present

## 2020-03-08 DIAGNOSIS — Z833 Family history of diabetes mellitus: Secondary | ICD-10-CM | POA: Diagnosis not present

## 2020-03-08 DIAGNOSIS — J441 Chronic obstructive pulmonary disease with (acute) exacerbation: Secondary | ICD-10-CM | POA: Diagnosis present

## 2020-03-08 DIAGNOSIS — R7989 Other specified abnormal findings of blood chemistry: Secondary | ICD-10-CM | POA: Diagnosis present

## 2020-03-08 DIAGNOSIS — E1165 Type 2 diabetes mellitus with hyperglycemia: Secondary | ICD-10-CM

## 2020-03-08 DIAGNOSIS — R0602 Shortness of breath: Secondary | ICD-10-CM | POA: Diagnosis not present

## 2020-03-08 DIAGNOSIS — I442 Atrioventricular block, complete: Secondary | ICD-10-CM | POA: Diagnosis present

## 2020-03-08 DIAGNOSIS — I878 Other specified disorders of veins: Secondary | ICD-10-CM | POA: Diagnosis present

## 2020-03-08 DIAGNOSIS — Z6841 Body Mass Index (BMI) 40.0 and over, adult: Secondary | ICD-10-CM

## 2020-03-08 DIAGNOSIS — I110 Hypertensive heart disease with heart failure: Secondary | ICD-10-CM | POA: Diagnosis not present

## 2020-03-08 DIAGNOSIS — I517 Cardiomegaly: Secondary | ICD-10-CM | POA: Diagnosis not present

## 2020-03-08 DIAGNOSIS — E662 Morbid (severe) obesity with alveolar hypoventilation: Secondary | ICD-10-CM | POA: Diagnosis not present

## 2020-03-08 DIAGNOSIS — I5033 Acute on chronic diastolic (congestive) heart failure: Secondary | ICD-10-CM | POA: Diagnosis present

## 2020-03-08 DIAGNOSIS — I421 Obstructive hypertrophic cardiomyopathy: Secondary | ICD-10-CM | POA: Diagnosis present

## 2020-03-08 DIAGNOSIS — I130 Hypertensive heart and chronic kidney disease with heart failure and stage 1 through stage 4 chronic kidney disease, or unspecified chronic kidney disease: Principal | ICD-10-CM | POA: Diagnosis present

## 2020-03-08 DIAGNOSIS — Z7984 Long term (current) use of oral hypoglycemic drugs: Secondary | ICD-10-CM

## 2020-03-08 DIAGNOSIS — Z79899 Other long term (current) drug therapy: Secondary | ICD-10-CM

## 2020-03-08 HISTORY — DX: Morbid (severe) obesity with alveolar hypoventilation: E66.2

## 2020-03-08 HISTORY — DX: Acute and chronic respiratory failure with hypoxia: J96.21

## 2020-03-08 HISTORY — DX: Acute respiratory failure, unspecified whether with hypoxia or hypercapnia: J96.00

## 2020-03-08 HISTORY — DX: Acute on chronic diastolic (congestive) heart failure: I50.33

## 2020-03-08 HISTORY — DX: Chronic obstructive pulmonary disease with (acute) exacerbation: J44.1

## 2020-03-08 LAB — URINALYSIS, MICROSCOPIC (REFLEX)

## 2020-03-08 LAB — I-STAT VENOUS BLOOD GAS, ED
Acid-Base Excess: 13 mmol/L — ABNORMAL HIGH (ref 0.0–2.0)
Bicarbonate: 42.3 mmol/L — ABNORMAL HIGH (ref 20.0–28.0)
Calcium, Ion: 1.17 mmol/L (ref 1.15–1.40)
HCT: 35 % — ABNORMAL LOW (ref 36.0–46.0)
Hemoglobin: 11.9 g/dL — ABNORMAL LOW (ref 12.0–15.0)
O2 Saturation: 68 %
Patient temperature: 98
Potassium: 3.4 mmol/L — ABNORMAL LOW (ref 3.5–5.1)
Sodium: 145 mmol/L (ref 135–145)
TCO2: 45 mmol/L — ABNORMAL HIGH (ref 22–32)
pCO2, Ven: 77 mmHg (ref 44.0–60.0)
pH, Ven: 7.347 (ref 7.250–7.430)
pO2, Ven: 39 mmHg (ref 32.0–45.0)

## 2020-03-08 LAB — CBC WITH DIFFERENTIAL/PLATELET
Abs Immature Granulocytes: 0.03 10*3/uL (ref 0.00–0.07)
Basophils Absolute: 0 10*3/uL (ref 0.0–0.1)
Basophils Relative: 0 %
Eosinophils Absolute: 0.1 10*3/uL (ref 0.0–0.5)
Eosinophils Relative: 2 %
HCT: 36.1 % (ref 36.0–46.0)
Hemoglobin: 10.8 g/dL — ABNORMAL LOW (ref 12.0–15.0)
Immature Granulocytes: 0 %
Lymphocytes Relative: 19 %
Lymphs Abs: 1.6 10*3/uL (ref 0.7–4.0)
MCH: 27.4 pg (ref 26.0–34.0)
MCHC: 29.9 g/dL — ABNORMAL LOW (ref 30.0–36.0)
MCV: 91.6 fL (ref 80.0–100.0)
Monocytes Absolute: 0.6 10*3/uL (ref 0.1–1.0)
Monocytes Relative: 7 %
Neutro Abs: 6.1 10*3/uL (ref 1.7–7.7)
Neutrophils Relative %: 72 %
Platelets: 138 10*3/uL — ABNORMAL LOW (ref 150–400)
RBC: 3.94 MIL/uL (ref 3.87–5.11)
RDW: 15.9 % — ABNORMAL HIGH (ref 11.5–15.5)
WBC: 8.4 10*3/uL (ref 4.0–10.5)
nRBC: 0 % (ref 0.0–0.2)

## 2020-03-08 LAB — BRAIN NATRIURETIC PEPTIDE: B Natriuretic Peptide: 450.8 pg/mL — ABNORMAL HIGH (ref 0.0–100.0)

## 2020-03-08 LAB — RESP PANEL BY RT-PCR (FLU A&B, COVID) ARPGX2
Influenza A by PCR: NEGATIVE
Influenza B by PCR: NEGATIVE
SARS Coronavirus 2 by RT PCR: NEGATIVE

## 2020-03-08 LAB — URINALYSIS, ROUTINE W REFLEX MICROSCOPIC
Bilirubin Urine: NEGATIVE
Glucose, UA: NEGATIVE mg/dL
Ketones, ur: NEGATIVE mg/dL
Leukocytes,Ua: NEGATIVE
Nitrite: NEGATIVE
Protein, ur: 30 mg/dL — AB
Specific Gravity, Urine: 1.015 (ref 1.005–1.030)
pH: 7 (ref 5.0–8.0)

## 2020-03-08 LAB — BASIC METABOLIC PANEL
Anion gap: 13 (ref 5–15)
BUN: 29 mg/dL — ABNORMAL HIGH (ref 8–23)
CO2: 34 mmol/L — ABNORMAL HIGH (ref 22–32)
Calcium: 9.5 mg/dL (ref 8.9–10.3)
Chloride: 96 mmol/L — ABNORMAL LOW (ref 98–111)
Creatinine, Ser: 0.9 mg/dL (ref 0.44–1.00)
GFR, Estimated: >60 mL/min (ref >60)
Glucose, Bld: 282 mg/dL — ABNORMAL HIGH (ref 70–99)
Potassium: 4 mmol/L (ref 3.5–5.1)
Sodium: 143 mmol/L (ref 135–145)

## 2020-03-08 LAB — COMPREHENSIVE METABOLIC PANEL
ALT: 19 U/L (ref 0–44)
AST: 17 U/L (ref 15–41)
Albumin: 4.3 g/dL (ref 3.5–5.0)
Alkaline Phosphatase: 52 U/L (ref 38–126)
Anion gap: 12 (ref 5–15)
BUN: 32 mg/dL — ABNORMAL HIGH (ref 8–23)
CO2: 36 mmol/L — ABNORMAL HIGH (ref 22–32)
Calcium: 9.4 mg/dL (ref 8.9–10.3)
Chloride: 96 mmol/L — ABNORMAL LOW (ref 98–111)
Creatinine, Ser: 0.86 mg/dL (ref 0.44–1.00)
GFR, Estimated: >60 mL/min (ref >60)
Glucose, Bld: 165 mg/dL — ABNORMAL HIGH (ref 70–99)
Potassium: 3.4 mmol/L — ABNORMAL LOW (ref 3.5–5.1)
Sodium: 144 mmol/L (ref 135–145)
Total Bilirubin: 0.6 mg/dL (ref 0.3–1.2)
Total Protein: 7.4 g/dL (ref 6.5–8.1)

## 2020-03-08 LAB — LACTIC ACID, PLASMA
Lactic Acid, Venous: 1 mmol/L (ref 0.5–1.9)
Lactic Acid, Venous: 1.1 mmol/L (ref 0.5–1.9)
Lactic Acid, Venous: 1.6 mmol/L (ref 0.5–1.9)

## 2020-03-08 LAB — RETICULOCYTES
Immature Retic Fract: 24.3 % — ABNORMAL HIGH (ref 2.3–15.9)
RBC.: 4.05 MIL/uL (ref 3.87–5.11)
Retic Count, Absolute: 86.7 10*3/uL (ref 19.0–186.0)
Retic Ct Pct: 2.1 % (ref 0.4–3.1)

## 2020-03-08 LAB — PROTIME-INR
INR: 1 (ref 0.8–1.2)
Prothrombin Time: 12.5 seconds (ref 11.4–15.2)

## 2020-03-08 LAB — MAGNESIUM: Magnesium: 2.2 mg/dL (ref 1.7–2.4)

## 2020-03-08 LAB — TROPONIN I (HIGH SENSITIVITY)
Troponin I (High Sensitivity): 27 ng/L — ABNORMAL HIGH (ref <18)
Troponin I (High Sensitivity): 30 ng/L — ABNORMAL HIGH (ref <18)
Troponin I (High Sensitivity): 22 ng/L — ABNORMAL HIGH (ref <18)
Troponin I (High Sensitivity): 24 ng/L — ABNORMAL HIGH (ref <18)

## 2020-03-08 LAB — D-DIMER, QUANTITATIVE: D-Dimer, Quant: 0.54 ug/mL-FEU — ABNORMAL HIGH (ref 0.00–0.50)

## 2020-03-08 LAB — GLUCOSE, CAPILLARY
Glucose-Capillary: 239 mg/dL — ABNORMAL HIGH (ref 70–99)
Glucose-Capillary: 251 mg/dL — ABNORMAL HIGH (ref 70–99)

## 2020-03-08 LAB — CK: Total CK: 46 U/L (ref 38–234)

## 2020-03-08 MED ORDER — IPRATROPIUM-ALBUTEROL 0.5-2.5 (3) MG/3ML IN SOLN
3.0000 mL | Freq: Once | RESPIRATORY_TRACT | Status: AC
  Administered 2020-03-08: 3 mL via RESPIRATORY_TRACT
  Filled 2020-03-08: qty 3

## 2020-03-08 MED ORDER — FUROSEMIDE 10 MG/ML IJ SOLN
40.0000 mg | Freq: Once | INTRAMUSCULAR | Status: AC
  Administered 2020-03-08: 40 mg via INTRAVENOUS
  Filled 2020-03-08: qty 4

## 2020-03-08 MED ORDER — ALBUTEROL SULFATE (2.5 MG/3ML) 0.083% IN NEBU: 2.5000 mg | INHALATION_SOLUTION | RESPIRATORY_TRACT | Status: DC | PRN

## 2020-03-08 MED ORDER — IPRATROPIUM-ALBUTEROL 0.5-2.5 (3) MG/3ML IN SOLN
3.0000 mL | Freq: Four times a day (QID) | RESPIRATORY_TRACT | Status: DC
  Administered 2020-03-09 – 2020-03-12 (×15): 3 mL via RESPIRATORY_TRACT
  Filled 2020-03-08 (×15): qty 3

## 2020-03-08 MED ORDER — PREDNISONE 20 MG PO TABS
40.0000 mg | ORAL_TABLET | Freq: Every day | ORAL | Status: DC
  Administered 2020-03-09: 40 mg via ORAL
  Filled 2020-03-08: qty 2

## 2020-03-08 MED ORDER — ACETAMINOPHEN 650 MG RE SUPP: 650.0000 mg | Freq: Four times a day (QID) | RECTAL | Status: DC | PRN

## 2020-03-08 MED ORDER — HYDROCODONE-ACETAMINOPHEN 5-325 MG PO TABS: 1.0000 | ORAL_TABLET | ORAL | Status: DC | PRN

## 2020-03-08 MED ORDER — METHYLPREDNISOLONE SODIUM SUCC 125 MG IJ SOLR
125.0000 mg | Freq: Once | INTRAMUSCULAR | Status: AC
  Administered 2020-03-08: 125 mg via INTRAVENOUS
  Filled 2020-03-08: qty 2

## 2020-03-08 MED ORDER — ALLOPURINOL 300 MG PO TABS
300.0000 mg | ORAL_TABLET | Freq: Every day | ORAL | Status: DC
Start: 2020-03-09 — End: 2020-03-12
  Administered 2020-03-09 – 2020-03-12 (×4): 300 mg via ORAL
  Filled 2020-03-08 (×5): qty 1

## 2020-03-08 MED ORDER — SODIUM CHLORIDE 0.9% FLUSH: 3.0000 mL | INTRAVENOUS | Status: DC | PRN

## 2020-03-08 MED ORDER — MONTELUKAST SODIUM 10 MG PO TABS
10.0000 mg | ORAL_TABLET | Freq: Every day | ORAL | Status: DC
  Administered 2020-03-08 – 2020-03-11 (×4): 10 mg via ORAL
  Filled 2020-03-08 (×4): qty 1

## 2020-03-08 MED ORDER — FLUTICASONE FUROATE-VILANTEROL 200-25 MCG/INH IN AEPB
1.0000 | INHALATION_SPRAY | Freq: Every day | RESPIRATORY_TRACT | Status: DC
  Administered 2020-03-10 – 2020-03-12 (×3): 1 via RESPIRATORY_TRACT
  Filled 2020-03-08 (×2): qty 28

## 2020-03-08 MED ORDER — INSULIN ASPART 100 UNIT/ML ~~LOC~~ SOLN
0.0000 [IU] | SUBCUTANEOUS | Status: DC
  Administered 2020-03-09: 11 [IU] via SUBCUTANEOUS
  Administered 2020-03-09 (×2): 8 [IU] via SUBCUTANEOUS
  Administered 2020-03-09 (×3): 15 [IU] via SUBCUTANEOUS
  Administered 2020-03-10: 5 [IU] via SUBCUTANEOUS
  Administered 2020-03-10: 11 [IU] via SUBCUTANEOUS
  Administered 2020-03-10: 2 [IU] via SUBCUTANEOUS
  Administered 2020-03-10 – 2020-03-11 (×3): 5 [IU] via SUBCUTANEOUS
  Administered 2020-03-11: 8 [IU] via SUBCUTANEOUS
  Administered 2020-03-11 (×2): 3 [IU] via SUBCUTANEOUS
  Administered 2020-03-12: 2 [IU] via SUBCUTANEOUS
  Administered 2020-03-12: 3 [IU] via SUBCUTANEOUS

## 2020-03-08 MED ORDER — ASPIRIN EC 81 MG PO TBEC
81.0000 mg | DELAYED_RELEASE_TABLET | Freq: Every day | ORAL | Status: DC
  Administered 2020-03-09 – 2020-03-12 (×4): 81 mg via ORAL
  Filled 2020-03-08 (×4): qty 1

## 2020-03-08 MED ORDER — ENOXAPARIN SODIUM 40 MG/0.4ML ~~LOC~~ SOLN
40.0000 mg | SUBCUTANEOUS | Status: DC
  Administered 2020-03-08 – 2020-03-10 (×3): 40 mg via SUBCUTANEOUS
  Filled 2020-03-08 (×4): qty 0.4

## 2020-03-08 MED ORDER — FUROSEMIDE 10 MG/ML IJ SOLN
60.0000 mg | Freq: Two times a day (BID) | INTRAMUSCULAR | Status: DC
  Administered 2020-03-08 – 2020-03-11 (×6): 60 mg via INTRAVENOUS
  Filled 2020-03-08 (×6): qty 6

## 2020-03-08 MED ORDER — ALBUTEROL SULFATE (2.5 MG/3ML) 0.083% IN NEBU
2.5000 mg | INHALATION_SOLUTION | RESPIRATORY_TRACT | Status: DC | PRN
Start: 2020-03-08 — End: 2020-03-12

## 2020-03-08 MED ORDER — SODIUM CHLORIDE 0.9% FLUSH
3.0000 mL | Freq: Two times a day (BID) | INTRAVENOUS | Status: DC
  Administered 2020-03-09 – 2020-03-12 (×8): 3 mL via INTRAVENOUS

## 2020-03-08 MED ORDER — POLYETHYLENE GLYCOL 3350 17 G PO PACK
17.0000 g | PACK | Freq: Every day | ORAL | Status: DC | PRN
  Administered 2020-03-12: 17 g via ORAL
  Filled 2020-03-08: qty 1

## 2020-03-08 MED ORDER — ACETAMINOPHEN 325 MG PO TABS
650.0000 mg | ORAL_TABLET | Freq: Four times a day (QID) | ORAL | Status: DC | PRN
  Filled 2020-03-08: qty 2

## 2020-03-08 MED ORDER — INSULIN GLARGINE 100 UNIT/ML ~~LOC~~ SOLN
10.0000 [IU] | Freq: Every day | SUBCUTANEOUS | Status: DC
  Administered 2020-03-08: 10 [IU] via SUBCUTANEOUS
  Filled 2020-03-08: qty 0.1

## 2020-03-08 MED ORDER — ROSUVASTATIN CALCIUM 20 MG PO TABS
40.0000 mg | ORAL_TABLET | Freq: Every day | ORAL | Status: DC
  Administered 2020-03-08 – 2020-03-11 (×4): 40 mg via ORAL
  Filled 2020-03-08 (×4): qty 2

## 2020-03-08 MED ORDER — LOSARTAN POTASSIUM 50 MG PO TABS
100.0000 mg | ORAL_TABLET | Freq: Every day | ORAL | Status: DC
  Administered 2020-03-09 – 2020-03-12 (×4): 100 mg via ORAL
  Filled 2020-03-08 (×4): qty 2

## 2020-03-08 MED ORDER — METOPROLOL SUCCINATE ER 50 MG PO TB24
50.0000 mg | ORAL_TABLET | Freq: Every day | ORAL | Status: DC
  Administered 2020-03-09: 50 mg via ORAL
  Filled 2020-03-08: qty 1

## 2020-03-08 MED ORDER — SODIUM CHLORIDE 0.9 % IV SOLN: 250.0000 mL | INTRAVENOUS | Status: DC | PRN

## 2020-03-08 NOTE — Progress Notes (Signed)
Paged admitting MD pt arrived to 4W.

## 2020-03-08 NOTE — ED Provider Notes (Addendum)
[facility] EMERGENCY DEPARTMENT Provider Note   CSN: [id number] Arrival date & time: 03/08/20  1014     History Chief Complaint  Patient presents with  . Shortness of Breath    Morgan Fischer is a 70 y.o. female.  HPI Patient reports that she always wears 2 L of oxygen.  She reports she has a history of congestive heart failure as well as chronic respiratory dysfunction with asthma.  She reports has been taking all of her medications per usual.  She reports she takes 80 Lasix twice daily.  She reports she is making urine and does not note that she is more swollen than normal.  She also has been using her albuterol twice daily.  She reports nonetheless over the past week she has been increasingly short of breath.  She has increased her oxygen to 3 L at times.  She reports one time her oxygen level dropped as low as 68% on her oxygen.  She reports her been several other episodes as well in the 46s and 80s.  She denies she is had fever, cough or nasal congestion.  No fevers no chills.  No general malaise.  Patient has had 2 vaccinations for COVID.  She has not yet had her booster    Past Medical History:  Diagnosis Date  . Acute diastolic heart failure ([place]) 07/17/2019  . Acute hypercapnic respiratory failure ([place]) 07/21/2016  . Acute on chronic congestive heart failure ([place]) 07/19/2016  . Acute on chronic respiratory failure with hypoxia ([place]) 10/20/2018  . Acute respiratory acidosis 07/19/2016  . Acute respiratory distress 07/19/2016  . Aortic stenosis 11/01/2018  . Arthritis   . Asthma   . Atherosclerosis of native coronary artery of native heart without angina pectoris 03/17/2016  . Chest pain 07/14/2019  . CHF (congestive heart failure) ([place])   . Chronic diastolic heart failure ([place]) 03/17/2016  . Chronic hypoxemic respiratory failure ([place])   . CKD (chronic kidney disease)   . COPD (chronic obstructive pulmonary disease) ([place])   . Diabetes mellitus without complication ([place])    . Drug allergy 07/21/2016  . Essential (primary) hypertension 11/19/2016  . Gout 11/19/2016  . Heart block AV complete ([place])   . High cholesterol   . HOCM (hypertrophic obstructive cardiomyopathy) ([place]) 09/18/2017  . Hyperglycemia due to type 2 diabetes mellitus ([place]) 07/19/2016  . Hypertension   . Hypertensive heart disease with heart failure ([place]) 03/17/2016  . Hypokalemia 08/24/2015  . Hypomagnesemia   . Hypoxia 03/17/2016  . Leukocytosis 07/19/2016  . Morbid obesity ([place])   . Nonrheumatic aortic valve stenosis 03/17/2016  . Obstructive sleep apnea 11/19/2016  . Panniculitis 08/23/2015  . Pure hypercholesterolemia 02/13/2017  . SOB (shortness of breath) 08/24/2015  . Troponin level elevated 08/24/2015  . Type 2 diabetes mellitus, without long-term current use of insulin ([place]) 11/19/2016    Patient Active Problem List   Diagnosis Date Noted  . Arthritis   . Diabetes mellitus without complication ([place])   . High cholesterol   . Acute diastolic heart failure ([place]) 07/17/2019  . Chronic hypoxemic respiratory failure ([place])   . CKD (chronic kidney disease)   . Chest pain 07/14/2019  . Aortic stenosis 11/01/2018  . Heart block AV complete ([place])   . Acute on chronic respiratory failure with hypoxia ([place]) 10/20/2018  . HOCM (hypertrophic obstructive cardiomyopathy) ([place]) 09/18/2017  . Pure hypercholesterolemia 02/13/2017  . CHF (congestive heart failure) ([place]) 12/22/2016  . Type 2 diabetes mellitus, without long-term current use  of insulin ([place]) 11/19/2016  . Essential (primary) hypertension 11/19/2016  . Gout 11/19/2016  . Obstructive sleep apnea 11/19/2016  . Acute hypercapnic respiratory failure ([place]) 07/21/2016  . Drug allergy 07/21/2016  . Acute on chronic congestive heart failure ([place]) 07/19/2016  . Acute respiratory acidosis 07/19/2016  . Acute respiratory distress 07/19/2016  . COPD (chronic obstructive pulmonary disease) ([place]) 07/19/2016  . Hyperglycemia due to type 2 diabetes  mellitus ([place]) 07/19/2016  . Leukocytosis 07/19/2016  . Chronic diastolic heart failure ([place]) 03/17/2016  . Hypertensive heart disease with heart failure ([place]) 03/17/2016  . Hypoxia 03/17/2016  . Atherosclerosis of native coronary artery of native heart without angina pectoris 03/17/2016  . Nonrheumatic aortic valve stenosis 03/17/2016  . Troponin level elevated 08/24/2015  . SOB (shortness of breath) 08/24/2015  . Hypokalemia 08/24/2015  . Morbid obesity ([place])   . Hypertension   . Asthma   . Hypomagnesemia   . Panniculitis 08/23/2015    Past Surgical History:  Procedure Laterality Date  . ABDOMINAL HYSTERECTOMY    . CESAREAN SECTION       OB History   No obstetric history on file.     Family History  Problem Relation Age of Onset  . Diabetes Mellitus I Mother   . CAD Mother   . Diabetes Mother   . Heart disease Father   . Cancer Father   . Breast cancer Sister   . Cancer Sister     Social History   Tobacco Use  . Smoking status: Never Smoker  . Smokeless tobacco: Never Used  Vaping Use  . Vaping Use: Never used  Substance Use Topics  . Alcohol use: No  . Drug use: No    Home Medications Prior to Admission medications   Medication Sig Start Date End Date Taking? Authorizing Provider  hydrOXYzine (ATARAX/VISTARIL) 25 MG tablet Take 25 mg by mouth at bedtime.   Yes [provider]  ACCU-CHEK AVIVA PLUS test strip 1 each 4 (four) times daily. 02/12/20   [provider]  allopurinol (ZYLOPRIM) 300 MG tablet Take 300 mg by mouth daily.    [provider]  amLODipine (NORVASC) 5 MG tablet Take 5 mg by mouth daily. 07/03/19   [provider]  aspirin EC 81 MG EC tablet Take 1 tablet (81 mg total) by mouth daily. 10/25/18   Lavina Hamman, MD  BD PEN NEEDLE NANO 2ND GEN 32G X 4 MM MISC 1 each by Other route as directed.  12/31/18   [provider]  Blood Glucose Monitoring Suppl (ACCU-CHEK AVIVA PLUS) w/Device KIT 1 each  by Other route as directed.  12/20/18   [provider]  BREO ELLIPTA 200-25 MCG/INH AEPB Inhale 1 puff into the lungs daily. 01/26/20   [provider]  Calcium Carb-Cholecalciferol (CALCIUM-VITAMIN D3) 500-400 MG-UNIT TABS Take 1 tablet by mouth 2 (two) times daily.     [provider]  Cyanocobalamin (VITAMIN B-12 PO) Take 1 tablet by mouth daily.    [provider]  ferrous sulfate 325 (65 FE) MG EC tablet Take 1 tablet by mouth daily. 07/11/19   [provider]  furosemide (LASIX) 80 MG tablet Take 1 tablet (80 mg total) by mouth 2 (two) times daily. 07/30/19 10/28/19  Revankar, Reita Cliche, MD  Garlic 4132 MG CAPS Take 1,000 mg by mouth daily.    [provider]  glimepiride (AMARYL) 4 MG tablet Take 4 mg by mouth 2 (two) times daily.  [provider]  ipratropium-albuterol (DUONEB) 0.5-2.5 (3) MG/3ML SOLN Inhale 3 mLs into the lungs every 4 (four) hours as needed (breathing.).     Gwenevere Ghazi, MD  LANTUS SOLOSTAR 100 UNIT/ML Solostar Pen Inject 10 Units into the skin daily.  01/21/19   [provider]  losartan (COZAAR) 100 MG tablet Take 1 tablet by mouth daily.    Bonsu, Osei A, DO  metFORMIN (GLUCOPHAGE) 500 MG tablet Take by mouth 2 (two) times daily with a meal.    [provider]  metFORMIN (GLUCOPHAGE-XR) 500 MG 24 hr tablet Take 500 mg by mouth 2 (two) times daily. 02/09/20   [provider]  metoprolol succinate (TOPROL-XL) 25 MG 24 hr tablet Take 1 tablet (25 mg total) by mouth daily. 02/10/20   Revankar, Reita Cliche, MD  metoprolol succinate (TOPROL-XL) 50 MG 24 hr tablet Take 1 tablet (50 mg total) by mouth daily. 02/10/20   Revankar, Reita Cliche, MD  montelukast (SINGULAIR) 10 MG tablet Take 10 mg by mouth at bedtime.    [provider]  Multiple Vitamin (MULTIVITAMIN WITH MINERALS) TABS tablet Take 1 tablet by mouth daily.    [provider]  nitroGLYCERIN (NITROSTAT) 0.4 MG SL tablet  Place 1 tablet (0.4 mg total) under the tongue every 5 (five) minutes as needed for chest pain. 04/25/19   Revankar, Reita Cliche, MD  Omega-3 Fatty Acids (FISH OIL) 1000 MG CAPS Take 1,000 mg by mouth 2 (two) times daily.     [provider]  OXYGEN Inhale 2 L into the lungs.     [provider]  polyethylene glycol (MIRALAX / GLYCOLAX) 17 g packet Take 17 g by mouth daily. 10/25/18   Lavina Hamman, MD  potassium chloride SA (KLOR-CON) 20 MEQ tablet Take 20 mEq by mouth daily. 03/04/20   [provider]  rosuvastatin (CRESTOR) 40 MG tablet Take 40 mg by mouth daily.    [provider]  Turmeric (QC TUMERIC COMPLEX) 500 MG CAPS Take 1 capsule by mouth daily.    [provider]  VENTOLIN HFA 108 (90 Base) MCG/ACT inhaler Inhale 2 puffs into the lungs every 6 (six) hours as needed for wheezing or shortness of breath.  07/03/18   [provider]    Allergies    Atorvastatin, Ciprocinonide [fluocinolone], Catapres [clonidine hcl], and Demadex [torsemide]  Review of Systems   Review of Systems 10 systems reviewed and negative except as per HPI Physical Exam Updated Vital Signs BP (!) 167/69 (BP Location: Left Arm)   Pulse 77   Temp 98.2 F (36.8 C) (Oral)   Resp (!) 22   Ht 5' 3.5" (1.613 m)   Wt 132.9 kg   LMP  (LMP Unknown)   SpO2 97%   BMI 51.09 kg/m   Physical Exam Constitutional:      Comments: Patient is alert and nontoxic.  Mild increased work of breathing at rest.  Speaking in full sentences.  HENT:     Mouth/Throat:     Pharynx: Oropharynx is clear.  Eyes:     Extraocular Movements: Extraocular movements intact.  Cardiovascular:     Comments: Heart regular.  2 out of 6 systolic ejection murmur. Pulmonary:     Comments: Mild increased work of breathing.  Speaking full sentences.  Breath sounds are very soft and diminished throughout the lung fields.  Patient does have significant obesity with thick chest wall. Abdominal:      General: There is no distension.  Palpations: Abdomen is soft.     Tenderness: There is no abdominal tenderness. There is no guarding.  Musculoskeletal:     Comments: Brawny skin changes of the lower legs consistent with chronic venous stasis.  At this time, trace edema.  Neurological:     General: No focal deficit present.     Mental Status: She is oriented to person, place, and time.     Coordination: Coordination normal.  Psychiatric:        Mood and Affect: Mood normal.     ED Results / Procedures / Treatments   Labs (all labs ordered are listed, but only abnormal results are displayed) Labs Reviewed  COMPREHENSIVE METABOLIC PANEL - Abnormal; Notable for the following components:      Result Value   Potassium 3.4 (*)    Chloride 96 (*)    CO2 36 (*)    Glucose, Bld 165 (*)    BUN 32 (*)    All other components within normal limits  BRAIN NATRIURETIC PEPTIDE - Abnormal; Notable for the following components:   B Natriuretic Peptide 450.8 (*)    All other components within normal limits  CBC WITH DIFFERENTIAL/PLATELET - Abnormal; Notable for the following components:   Hemoglobin 10.8 (*)    MCHC 29.9 (*)    RDW 15.9 (*)    Platelets 138 (*)    All other components within normal limits  URINALYSIS, ROUTINE W REFLEX MICROSCOPIC - Abnormal; Notable for the following components:   Hgb urine dipstick SMALL (*)    Protein, ur 30 (*)    All other components within normal limits  URINALYSIS, MICROSCOPIC (REFLEX) - Abnormal; Notable for the following components:   Bacteria, UA RARE (*)    All other components within normal limits  I-STAT VENOUS BLOOD GAS, ED - Abnormal; Notable for the following components:   pCO2, Ven 77.0 (*)    Bicarbonate 42.3 (*)    TCO2 45 (*)    Acid-Base Excess 13.0 (*)    Potassium 3.4 (*)    HCT 35.0 (*)    Hemoglobin 11.9 (*)    All other components within normal limits  TROPONIN I (HIGH SENSITIVITY) - Abnormal; Notable for the following  components:   Troponin I (High Sensitivity) 30 (*)    All other components within normal limits  RESP PANEL BY RT-PCR (FLU A&B, COVID) ARPGX2  LACTIC ACID, PLASMA  PROTIME-INR  LACTIC ACID, PLASMA  LACTIC ACID, PLASMA  TROPONIN I (HIGH SENSITIVITY)  TROPONIN I (HIGH SENSITIVITY)    EKG EKG Interpretation  Date/Time:  Monday March 08 2020 10:47:49 EST Ventricular Rate:  81 PR Interval:    QRS Duration: 105 QT Interval:  434 QTC Calculation: 504 R Axis:   -5 Text Interpretation: Sinus rhythm LVH with secondary repolarization abnormality Confirmed by Charlesetta Shanks [phone]) on 03/08/2020 12:37:21 PM   Radiology DG Chest Portable 1 View  Result Date: 03/08/2020 CLINICAL DATA:  Shortness of breath EXAM: PORTABLE CHEST 1 VIEW COMPARISON:  July 14, 2019 FINDINGS: Lungs are clear. There is cardiomegaly with central pulmonary vascular prominence and rapid peripheral tapering. No evident adenopathy. There is aortic atherosclerosis. No bone lesions. IMPRESSION: Stable cardiomegaly with pulmonary arterial hypertension. Lungs clear. No adenopathy appreciable. Aortic Atherosclerosis (ICD10-I70.0). Electronically Signed   By: Lowella Grip III M.D.   On: 03/08/2020 11:07    Procedures Procedures   Medications Ordered in ED Medications  albuterol (PROVENTIL) (2.5 MG/3ML) 0.083% nebulizer solution 2.5 mg (has no administration in time  range)  ipratropium-albuterol (DUONEB) 0.5-2.5 (3) MG/3ML nebulizer solution 3 mL (3 mLs Nebulization Given 03/08/20 1147)  methylPREDNISolone sodium succinate (SOLU-MEDROL) 125 mg/2 mL injection 125 mg (125 mg Intravenous Given 03/08/20 1410)  furosemide (LASIX) injection 40 mg (40 mg Intravenous Given 03/08/20 1459)    ED Course  I have reviewed the triage vital signs and the nursing notes.  Pertinent labs & imaging results that were available during my care of the patient were reviewed by me and considered in my medical decision making (see chart for  details).  Clinical Course as of 03/08/20 [place] Mar 08, 2020  1504 Consult: Dr. Darrick Meigs for admission.  [MP]    Clinical Course User Index [MP] Charlesetta Shanks, MD   MDM Rules/Calculators/A&P                         Patient has chronic respiratory failure on 2 L nasal cannula oxygen.  Over the past week and especially 2 days patient has gotten significantly increased shortness of breath with usual activity.  She has started to increase her oxygen at home.  Patient reports she has had increased use of her albuterol nebulizer.  Symptoms continue to worsen.  BNP is moderately elevated, patient does have peripheral edema.  Will treat with combination of Lasix for CHF exacerbation and DuoNeb plus Solu-Medrol for hypercapnic respiratory failure.  Patient has clear mental status.  At this time stable for admission.  Final Clinical Impression(s) / ED Diagnoses Final diagnoses:  Acute on chronic respiratory failure with hypoxia and hypercapnia (HCC)  Obesity hypoventilation syndrome (HCC)  Acute on chronic systolic congestive heart failure [facility])    Rx / DC Orders ED Discharge Orders    None       Charlesetta Shanks, MD 03/08/20 1424    Charlesetta Shanks, MD 03/08/20 1507

## 2020-03-08 NOTE — Telephone Encounter (Signed)
Pt's daughter states that pt's o2 sat is dropping into the upper 60's. Advised to go to the ED. Daughter verbalized understanding and had no additional questions.

## 2020-03-08 NOTE — ED Triage Notes (Signed)
C/o shortness of breath x 1 week  Getting worse   Normally on o49m 2l [place],  Denies cp  States bp has been high also

## 2020-03-08 NOTE — Progress Notes (Signed)
Report received from [facility] RN,([facility] ED).

## 2020-03-08 NOTE — H&P (Signed)
Morgan Fischer WPV:[id number] DOB: February 23, 1950 DOA: 03/08/2020    PCP: Benito Mccreedy, MD   Outpatient Specialists:    CARDS: Dr.Revankar, Reita Cliche   Patient arrived to ER on 03/08/20 at 1014 Referred by Attending Oswald Hillock, MD   Patient coming from: home Lives  With family    Chief Complaint:   Chief Complaint  Patient presents with  . Shortness of Breath    HPI: Morgan Fischer is a 70 y.o. female with medical history significant of diastolic congestive heart failure as well as chronic respiratory dysfunction on 2 L with asthma/COPD.,  CKD, DM2, HTN, gout complete heart block, HLD, hypertrophic obstructive cardiomyopathy, morbid obesity, obstructive sleep apnea    Presented with  Hypoxia at home o2 since 2004  saT DROPPING into 60% Reports its been up and down Have been SOB for the past 1wk Noted minimal leg swelling She uses BIPAP every night A little bit of wheezing today Never smoked no EtOh Has been taking lasix as supposed  80 mg BId No salty foods No CP, no fever Sleeps on 2 pillows hat is unchanged Infectious risk factors:  Reports shortness of breath,    Has   been vaccinated against COVID     Initial COVID TEST  NEGATIVE   Lab Results  Component Value Date   [place] 03/08/2020   [place] NEGATIVE 07/14/2019   [place] NEGATIVE 10/20/2018   Regarding pertinent Chronic problems:  Gout on allopurinol  HOCM -has been seen by cardiology and declined any additional work-up for this.   Hyperlipidemia -  on statins Crestor Lipid Panel     Component Value Date/Time   CHOL 181 08/24/2015 0422   TRIG 100 08/24/2015 0422   HDL 43 08/24/2015 0422   CHOLHDL 4.2 08/24/2015 0422   VLDL 20 08/24/2015 0422   LDLCALC 118 (H) 08/24/2015 0422     HTN on Norvasc Cozaar Toprol   chronic CHF diastolic - last echo 7/48/2707 Lasix 80 mg twice a day diastolic parameters are consistent with Grade II  diastolic dysfunction  (pseudonormalization).    CAD  - On Aspirin, statin, betablocker,                   -  followed by cardiology                -    DM 2 -  Lab Results  Component Value Date   HGBA1C 8.2 (H) 07/15/2019    on insulin, PO meds Lantus     Morbid obesity-   BMI Readings from Last 1 Encounters:  03/08/20 51.09 kg/m       Asthma/COPD -  controlled on home inhalers/ nebs f Obesity hypoventilation syndrome on chronic oxygen 2 L   OSA -on nocturnal BIPAP      CKD stage II - baseline Cr 0.9 Estimated Creatinine Clearance: 80.8 mL/min (by C-G formula based on SCr of 0.86 mg/dL).  Lab Results  Component Value Date   CREATININE 0.86 03/08/2020   CREATININE 0.94 11/10/2019   CREATININE 1.07 (H) 07/30/2019       Chronic anemia - baseline hg Hemoglobin & Hematocrit  Recent Labs    07/18/19 0452 03/08/20 1215 03/08/20 1233  HGB 9.2* 10.8* 11.9*    While in ER: Noted to be likely in CHF versus asthma exacerbation  Was given lasix and DuoNeb and Solu-Medrol Patient diuresed in the emergency department and O2 sats improved patient was able to go back to  her 2 L which is her baseline. Noted to have elevated troponin in the 30s which is her chronic   ED Triage Vitals  Enc Vitals Group     BP 03/08/20 1030 (!) 157/50     Pulse Rate 03/08/20 1030 70     Resp 03/08/20 1030 18     Temp 03/08/20 1030 98 F (36.7 C)     Temp Source 03/08/20 1030 Oral     SpO2 03/08/20 1030 99 %     Weight 03/08/20 1030 293 lb (132.9 kg)     Height 03/08/20 1030 5' 3.5" (1.613 m)     Head Circumference --      Peak Flow --      Pain Score 03/08/20 1045 0     Pain Loc --      Pain Edu? --      Excl. in [place]? --   TMAX(24)@     _________________________________________ Significant initial  Findings: Abnormal Labs Reviewed  COMPREHENSIVE METABOLIC PANEL - Abnormal; Notable for the following components:      Result Value   Potassium 3.4 (*)    Chloride 96 (*)    CO2 36 (*)    Glucose, Bld 165  (*)    BUN 32 (*)    All other components within normal limits  BRAIN NATRIURETIC PEPTIDE - Abnormal; Notable for the following components:   B Natriuretic Peptide 450.8 (*)    All other components within normal limits  CBC WITH DIFFERENTIAL/PLATELET - Abnormal; Notable for the following components:   Hemoglobin 10.8 (*)    MCHC 29.9 (*)    RDW 15.9 (*)    Platelets 138 (*)    All other components within normal limits  URINALYSIS, ROUTINE W REFLEX MICROSCOPIC - Abnormal; Notable for the following components:   Hgb urine dipstick SMALL (*)    Protein, ur 30 (*)    All other components within normal limits  URINALYSIS, MICROSCOPIC (REFLEX) - Abnormal; Notable for the following components:   Bacteria, UA RARE (*)    All other components within normal limits  GLUCOSE, CAPILLARY - Abnormal; Notable for the following components:   Glucose-Capillary 239 (*)    All other components within normal limits  BASIC METABOLIC PANEL - Abnormal; Notable for the following components:   Chloride 96 (*)    CO2 34 (*)    Glucose, Bld 282 (*)    BUN 29 (*)    All other components within normal limits  D-DIMER, QUANTITATIVE - Abnormal; Notable for the following components:   D-Dimer, Quant 0.54 (*)    All other components within normal limits  RETICULOCYTES - Abnormal; Notable for the following components:   Immature Retic Fract 24.3 (*)    All other components within normal limits  GLUCOSE, CAPILLARY - Abnormal; Notable for the following components:   Glucose-Capillary 251 (*)    All other components within normal limits  I-STAT VENOUS BLOOD GAS, ED - Abnormal; Notable for the following components:   pCO2, Ven 77.0 (*)    Bicarbonate 42.3 (*)    TCO2 45 (*)    Acid-Base Excess 13.0 (*)    Potassium 3.4 (*)    HCT 35.0 (*)    Hemoglobin 11.9 (*)    All other components within normal limits  TROPONIN I (HIGH SENSITIVITY) - Abnormal; Notable for the following components:   Troponin I (High  Sensitivity) 30 (*)    All other components within normal limits  TROPONIN I (HIGH  SENSITIVITY) - Abnormal; Notable for the following components:   Troponin I (High Sensitivity) 27 (*)    All other components within normal limits  TROPONIN I (HIGH SENSITIVITY) - Abnormal; Notable for the following components:   Troponin I (High Sensitivity) 22 (*)    All other components within normal limits  TROPONIN I (HIGH SENSITIVITY) - Abnormal; Notable for the following components:   Troponin I (High Sensitivity) 24 (*)    All other components within normal limits   ____________________________________________ Ordered   CXR -cardiomegaly with pulmonary artery hypertension  _________________________ Troponin 27-30 ECG: Ordered Personally reviewed by me showing: HR : 81 Rhythm:  NSR, LVH  nonspecific changes  QTC 504   Lactic Acid, Venous    Component Value Date/Time   LATICACIDVEN 1.1 03/08/2020 1500       UA   no evidence of UTI      Urine analysis:    Component Value Date/Time   COLORURINE YELLOW 03/08/2020 1214   APPEARANCEUR CLEAR 03/08/2020 1214   LABSPEC 1.015 03/08/2020 1214   PHURINE 7.0 03/08/2020 1214   GLUCOSEU NEGATIVE 03/08/2020 1214   HGBUR SMALL (A) 03/08/2020 1214   BILIRUBINUR NEGATIVE 03/08/2020 1214   KETONESUR NEGATIVE 03/08/2020 1214   PROTEINUR 30 (A) 03/08/2020 1214   NITRITE NEGATIVE 03/08/2020 1214   LEUKOCYTESUR NEGATIVE 03/08/2020 1214    Results for orders placed or performed during the hospital encounter of 03/08/20  Resp Panel by RT-PCR (Flu A&B, Covid) Nasopharyngeal Swab     Status: None   Collection Time: 03/08/20 12:14 PM   Specimen: Nasopharyngeal Swab; Nasopharyngeal(NP) swabs in vial transport medium  Result Value Ref Range Status   SARS Coronavirus 2 by RT PCR NEGATIVE NEGATIVE Final        Influenza A by PCR NEGATIVE NEGATIVE Final   Influenza B by PCR NEGATIVE NEGATIVE Final         _______________________________________________ Hospitalist was called for admission for CHF exacerbation  The following Work up has been ordered so far:  Orders Placed This Encounter  Procedures  . Resp Panel by RT-PCR (Flu A&B, Covid) Nasopharyngeal Swab  . DG Chest Portable 1 View  . Comprehensive metabolic panel  . Brain natriuretic peptide  . CBC with Differential  . Protime-INR  . Urinalysis, Routine w reflex microscopic  . Lactic acid, plasma  . Urinalysis, Microscopic (reflex)  . Glucose, capillary  . Cardiac Monitoring  . Page Admitting Doctor upon patients arrival to unit/floor  . Vital signs  . Up with assistance  . Initiate Carrier Fluid Protocol  . Consult to hospitalist  . I-Stat venous blood gas, ED  . EKG 12-Lead  . Saline lock IV  . Saline lock IV  . Admit to Inpatient (patient's expected length of stay will be greater than 2 midnights or inpatient only procedure)  . Admit to Inpatient (patient's expected length of stay will be greater than 2 midnights or inpatient only procedure)     Following Medications were ordered in ER: Medications  albuterol (PROVENTIL) (2.5 MG/3ML) 0.083% nebulizer solution 2.5 mg (has no administration in time range)  ipratropium-albuterol (DUONEB) 0.5-2.5 (3) MG/3ML nebulizer solution 3 mL (3 mLs Nebulization Given 03/08/20 1147)  methylPREDNISolone sodium succinate (SOLU-MEDROL) 125 mg/2 mL injection 125 mg (125 mg Intravenous Given 03/08/20 1410)  furosemide (LASIX) injection 40 mg (40 mg Intravenous Given 03/08/20 1459)        Consult Orders  (From admission, onward)         Start  Ordered   03/08/20 1423  Consult to hospitalist  Called [facility] spoke with Abbe [place]  Once       Provider:  (Not yet assigned)  Question Answer Comment  Place call to: [facility]   Reason for Consult Admit      03/08/20 1422            OTHER Significant initial  Findings:  labs showing:    Recent Labs  Lab 03/08/20 1215  03/08/20 1233 03/08/20 2245  NA 144 145 143  K 3.4* 3.4* 4.0  CO2 36*  --  34*  GLUCOSE 165*  --  282*  BUN 32*  --  29*  CREATININE 0.86  --  0.90  CALCIUM 9.4  --  9.5  MG  --   --  2.2    Cr  Stable,  Lab Results  Component Value Date   CREATININE 0.90 03/08/2020   CREATININE 0.86 03/08/2020   CREATININE 0.94 11/10/2019    Recent Labs  Lab 03/08/20 1215  AST 17  ALT 19  ALKPHOS 52  BILITOT 0.6  PROT 7.4  ALBUMIN 4.3   Lab Results  Component Value Date   CALCIUM 9.4 03/08/2020          Plt: Lab Results  Component Value Date   PLT 138 (L) 03/08/2020      COVID-19 Labs  No results for input(s): DDIMER, FERRITIN, LDH, CRP in the last 72 hours.  Lab Results  Component Value Date   SARSCOV2NAA NEGATIVE 03/08/2020   SARSCOV2NAA NEGATIVE 07/14/2019   SARSCOV2NAA NEGATIVE 10/20/2018     Venous  Blood Gas result:  PH7.47  PCO2 77    ABG    Component Value Date/Time   HCO3 42.3 (H) 03/08/2020 1233   TCO2 45 (H) 03/08/2020 1233   O2SAT 68.0 03/08/2020 1233       Recent Labs  Lab 03/08/20 1215 03/08/20 1233  WBC 8.4  --   NEUTROABS 6.1  --   HGB 10.8* 11.9*  HCT 36.1 35.0*  MCV 91.6  --   PLT 138*  --     HG/HCT stable,      Component Value Date/Time   HGB 11.9 (L) 03/08/2020 1233   HCT 35.0 (L) 03/08/2020 1233   MCV 91.6 03/08/2020 1215   Cardiac Panel (last 3 results) No results for input(s): CKTOTAL, CKMB, TROPONINI, RELINDX in the last 72 hours.   BNP (last 3 results) Recent Labs    07/14/19 1506 03/08/20 1214  BNP 329.2* 450.8*      DM  labs:  HbA1C: Recent Labs    07/15/19 1510  HGBA1C 8.2*       CBG (last 3)  Recent Labs    03/08/20 2037  GLUCAP 239*          Cultures:    Component Value Date/Time   SDES BLOOD RIGHT WRIST 08/24/2015 0420   SPECREQUEST IN PEDIATRIC BOTTLE 1CC 08/24/2015 0420   CULT NO GROWTH 5 DAYS 08/24/2015 0420   REPTSTATUS 08/29/2015 FINAL 08/24/2015 0420     Radiological Exams on  Admission: DG Chest Portable 1 View  Result Date: 03/08/2020 CLINICAL DATA:  Shortness of breath EXAM: PORTABLE CHEST 1 VIEW COMPARISON:  July 14, 2019 FINDINGS: Lungs are clear. There is cardiomegaly with central pulmonary vascular prominence and rapid peripheral tapering. No evident adenopathy. There is aortic atherosclerosis. No bone lesions. IMPRESSION: Stable cardiomegaly with pulmonary arterial hypertension. Lungs clear. No adenopathy appreciable. Aortic Atherosclerosis (ICD10-I70.0). Electronically Signed  By: Lowella Grip III M.D.   On: 03/08/2020 11:07   _______________________________________________________________________________________________________ Latest  Blood pressure (!) 162/75, pulse 94, temperature 98.6 F (37 C), resp. rate 16, height 5' 3.5" (1.613 m), weight 132.9 kg, SpO2 100 %.   Review of Systems:    Pertinent positives include:   fatigue weight gain 2 lb shortness of breath at rest.   dyspnea on exertion,  Constitutional:  No weight loss, night sweats, Fevers, chills, , HEENT:  No headaches, Difficulty swallowing,Tooth/dental problems,Sore throat,  No sneezing, itching, ear ache, nasal congestion, post nasal drip,  Cardio-vascular:  No chest pain, Orthopnea, PND, anasarca, dizziness, palpitations.no Bilateral lower extremity swelling  GI:  No heartburn, indigestion, abdominal pain, nausea, vomiting, diarrhea, change in bowel habits, loss of appetite, melena, blood in stool, hematemesis Resp:  no  No excess mucus, no productive cough, No non-productive cough, No coughing up of blood.No change in color of mucus.No wheezing. Skin:  no rash or lesions. No jaundice GU:  no dysuria, change in color of urine, no urgency or frequency. No straining to urinate.  No flank pain.  Musculoskeletal:  No joint pain or no joint swelling. No decreased range of motion. No back pain.  Psych:  No change in mood or affect. No depression or anxiety. No memory loss.   Neuro: no localizing neurological complaints, no tingling, no weakness, no double vision, no gait abnormality, no slurred speech, no confusion  All systems reviewed and apart from [place] all are negative _______________________________________________________________________________________________ Past Medical History:   Past Medical History:  Diagnosis Date  . Acute diastolic heart failure ([place]) 07/17/2019  . Acute hypercapnic respiratory failure ([place]) 07/21/2016  . Acute on chronic congestive heart failure ([place]) 07/19/2016  . Acute on chronic respiratory failure with hypoxia ([place]) 10/20/2018  . Acute respiratory acidosis 07/19/2016  . Acute respiratory distress 07/19/2016  . Aortic stenosis 11/01/2018  . Arthritis   . Asthma   . Atherosclerosis of native coronary artery of native heart without angina pectoris 03/17/2016  . Chest pain 07/14/2019  . CHF (congestive heart failure) ([place])   . Chronic diastolic heart failure ([place]) 03/17/2016  . Chronic hypoxemic respiratory failure ([place])   . CKD (chronic kidney disease)   . COPD (chronic obstructive pulmonary disease) ([place])   . Diabetes mellitus without complication ([place])   . Drug allergy 07/21/2016  . Essential (primary) hypertension 11/19/2016  . Gout 11/19/2016  . Heart block AV complete ([place])   . High cholesterol   . HOCM (hypertrophic obstructive cardiomyopathy) ([place]) 09/18/2017  . Hyperglycemia due to type 2 diabetes mellitus ([place]) 07/19/2016  . Hypertension   . Hypertensive heart disease with heart failure ([place]) 03/17/2016  . Hypokalemia 08/24/2015  . Hypomagnesemia   . Hypoxia 03/17/2016  . Leukocytosis 07/19/2016  . Morbid obesity ([place])   . Nonrheumatic aortic valve stenosis 03/17/2016  . Obstructive sleep apnea 11/19/2016  . Panniculitis 08/23/2015  . Pure hypercholesterolemia 02/13/2017  . SOB (shortness of breath) 08/24/2015  . Troponin level elevated 08/24/2015  . Type 2 diabetes mellitus, without long-term current use of insulin  ([place]) 11/19/2016      Past Surgical History:  Procedure Laterality Date  . ABDOMINAL HYSTERECTOMY    . CESAREAN SECTION      Social History:  Ambulatory   Independently      reports that she has never smoked. She has never used smokeless tobacco. She reports that she does not drink alcohol and does not use drugs.   Family History:  Family History  Problem Relation Age of Onset  . Diabetes Mellitus I Mother   . CAD Mother   . Diabetes Mother   . Heart disease Father   . Cancer Father   . Breast cancer Sister   . Cancer Sister    ______________________________________________________________________________________________ Allergies: Allergies  Allergen Reactions  . Atorvastatin Swelling    Lip swelling  . Ciprocinonide [Fluocinolone] Other (See Comments)    Kidney failure  . Catapres [Clonidine Hcl] Rash  . Demadex [Torsemide] Rash     Prior to Admission medications   Medication Sig Start Date End Date Taking? Authorizing Provider  ACCU-CHEK AVIVA PLUS test strip 1 each 4 (four) times daily. 02/12/20  Yes [provider]  allopurinol (ZYLOPRIM) 300 MG tablet Take 300 mg by mouth daily.   Yes [provider]  amLODipine (NORVASC) 5 MG tablet Take 5 mg by mouth daily. 07/03/19  Yes [provider]  aspirin EC 81 MG EC tablet Take 1 tablet (81 mg total) by mouth daily. 10/25/18  Yes Lavina Hamman, MD  BREO ELLIPTA 200-25 MCG/INH AEPB Inhale 1 puff into the lungs daily. 01/26/20  Yes [provider]  Calcium Carb-Cholecalciferol (CALCIUM-VITAMIN D3) 500-400 MG-UNIT TABS Take 1 tablet by mouth daily.   Yes [provider]  Cyanocobalamin (VITAMIN B-12 PO) Take 1 tablet by mouth daily.   Yes [provider]  ferrous sulfate 325 (65 FE) MG EC tablet Take 1 tablet by mouth daily. 07/11/19  Yes [provider]  furosemide (LASIX) 80 MG tablet Take 1 tablet (80 mg total) by mouth 2 (two) times daily. 07/30/19 10/28/19  Yes Revankar, Reita Cliche, MD  Garlic 4132 MG CAPS Take 1,000 mg by mouth daily.   Yes [provider]  glimepiride (AMARYL) 4 MG tablet Take 4 mg by mouth 2 (two) times daily.   Yes [provider]  hydrOXYzine (ATARAX/VISTARIL) 25 MG tablet Take 25 mg by mouth at bedtime.   Yes [provider]  ipratropium-albuterol (DUONEB) 0.5-2.5 (3) MG/3ML SOLN Inhale 3 mLs into the lungs every 4 (four) hours as needed (breathing.).    Yes Beau[place], Patrick Jupiter, MD  LANTUS SOLOSTAR 100 UNIT/ML Solostar Pen Inject 10 Units into the skin at bedtime. 01/21/19  Yes [provider]  losartan (COZAAR) 100 MG tablet Take 1 tablet by mouth daily.   Yes Bonsu, Osei A, DO  metFORMIN (GLUCOPHAGE) 500 MG tablet Take by mouth 2 (two) times daily with a meal.   Yes [provider]  metoprolol succinate (TOPROL-XL) 25 MG 24 hr tablet Take 1 tablet (25 mg total) by mouth daily. 02/10/20  Yes Revankar, Reita Cliche, MD  metoprolol succinate (TOPROL-XL) 50 MG 24 hr tablet Take 1 tablet (50 mg total) by mouth daily. 02/10/20  Yes Revankar, Reita Cliche, MD  montelukast (SINGULAIR) 10 MG tablet Take 10 mg by mouth at bedtime.   Yes [provider]  Multiple Vitamin (MULTIVITAMIN WITH MINERALS) TABS tablet Take 1 tablet by mouth daily.   Yes [provider]  Omega-3 Fatty Acids (FISH OIL) 1000 MG CAPS Take 1,000 mg by mouth 2 (two) times daily.    Yes [provider]  OXYGEN Inhale 2 L into the lungs.    Yes [provider]  polyethylene glycol (MIRALAX / GLYCOLAX) 17 g packet Take 17 g by mouth daily. Patient taking differently: Take 17 g by mouth daily as needed. 10/25/18  Yes Lavina Hamman, MD  potassium chloride SA (KLOR-CON) 20  MEQ tablet Take 20 mEq by mouth daily. 03/04/20  Yes [provider]  rosuvastatin (CRESTOR) 40 MG tablet Take 40 mg by mouth at bedtime.   Yes [provider]  Turmeric 500 MG CAPS Take 1 capsule by mouth daily.   Yes [provider]  BD PEN NEEDLE NANO 2ND GEN 32G X 4 MM MISC 1 each by Other route as directed.  12/31/18   [provider]  Blood Glucose Monitoring Suppl (ACCU-CHEK AVIVA PLUS) w/Device KIT 1 each by Other route as directed.  12/20/18   [provider]  metFORMIN (GLUCOPHAGE-XR) 500 MG 24 hr tablet Take 500 mg by mouth 2 (two) times daily. 02/09/20   [provider]  nitroGLYCERIN (NITROSTAT) 0.4 MG SL tablet Place 1 tablet (0.4 mg total) under the tongue every 5 (five) minutes as needed for chest pain. 04/25/19   Revankar, Reita Cliche, MD  VENTOLIN HFA 108 (90 Base) MCG/ACT inhaler Inhale 2 puffs into the lungs every 6 (six) hours as needed for wheezing or shortness of breath.  07/03/18   [provider]    ___________________________________________________________________________________________________ Physical Exam: ZJIRCV with BMI 03/08/2020 03/08/2020 03/08/2020  Height - - -  Weight - - -  BMI - - -  Systolic 893 810 175  Diastolic 75 65 72  Pulse 94 76 78     1. General:  in No  Acute distress    Chronically ill  -appearing 2. Psychological: Alert and  Oriented 3. Head/ENT:   Moist  Mucous Membranes                          Head Non traumatic, neck supple                          Poor Dentition 4. SKIN: normal   Skin turgor,  Skin clean Dry and intact no rash chronic dependent edema with venous stasis changes of the legs  5. Heart: Regular rate and rhythm no Murmur, no Rub or gallop 6. Lungs: Distant no wheezes or crackles   7. Abdomen: Soft,  non-tender, Non distended   obese large pannus present no evidence of superficial infection 8. Lower extremities: no clubbing, cyanosis, 1+ edema 9. Neurologically Grossly intact, moving all 4 extremities equally     10. MSK: Normal range of motion    Chart has been reviewed  ______________________________________________________________________  Assessment/Plan  70 y.o. female with medical history  significant of diastolic congestive heart failure as well as chronic respiratory dysfunction on 2 L with asthma/COPD.,  CKD, DM2, HTN, gout complete heart block, HLD, hypertrophic obstructive cardiomyopathy, morbid obesity, obstructive sleep apnea    Admitted for acute respiratory failure  Present on Admission:  . Acute on chronic diastolic CHF (congestive heart failure) ([place]) -  - admit on telemetry,  cycle cardiac enzymes, Troponin 27- 30   obtain serial ECG  to evaluate for ischemia as a cause of heart failure  monitor daily weight:  Filed Weights   03/08/20 1030  Weight: 132.9 kg   Last BNP BNP (last 3 results) Recent Labs    07/14/19 1506 03/08/20 1214  BNP 329.2* 450.8*     diurese with IV lasix and monitor orthostatics and creatinine to avoid over diuresis.  Order echogram to evaluate EF and valves  ACE/ARBi ordered    cardiology consulted    . COPD/asthma with acute exacerbation ([place]) -reactive airway disease may  contribute to acute on chronic respiratory failure.  Although suggest more likely cardiac etiology.  Given some wheezing will give steroids and monitor pulmonary status  . CKD (chronic kidney disease) -  -chronic avoid nephrotoxic medications such as NSAIDs, Vanco Zosyn combo,  avoid hypotension, continue to follow renal function   . Acute on chronic respiratory failure with hypoxia (H[facility]) -  this patient has acute respiratory failure with Hypoxia and  Hypercarbia as documented by the presence of following: O2 saturatio< 90% on RA pH <7.35 with pCO2 >50  Likely due to:   CHF exacerbation, asthma exacerbation, obesity hypoventilation syndrome   Provide O2 therapy and titrate as needed  Continuous pulse ox   check Pulse ox with ambulation prior to discharge   D-dimer   mildly elevated likely in the setting of severe obesity No chest pain to suggest PE  . Essential (primary) hypertension hold off on Norvasc as this can increase leg swelling otherwise continue  home medications  . Gout continue allopurinol stable  . HOCM (hypertrophic obstructive cardiomyopathy) ([place]) chronic stable appreciate cardiology input patient in the past have refused further work-up  . High cholesterol resume home medications Crestor  . Hypokalemia -replace and follow check magnesium level  . Morbid obesity ([place]) -will need nutritional follow-up as an outpatient  . Troponin level elevated -stable x3 history of elevated troponin in the past.  No chest pain, nonischemic EKG.  Continue to follow.  Most likely demand ischemia in the setting of intermittent hypoxia. Appreciate cardiology input  . Obstructive sleep apnea ordered BiPAP  Chronic hypercarbic failure no evidence of confusion or lethargy Continue BiPAP at night  Other plan as per orders.  DVT prophylaxis:   Lovenox      Code Status:    Code Status: Prior FULL CODE  as per patient    I had personally discussed CODE STATUS with patient       Family Communication:   Family not at  Bedside    Disposition Plan:    To home once workup is complete and patient is stable   Following barriers for discharge:                            Electrolytes corrected                                                      Will need consultants to evaluate patient prior to discharge                     Would benefit from PT/OT eval prior to DC  Ordered                                      Diabetes care coordinator                                     Nutrition    consulted                                   Consults  called:    emailed cardiology  Admission status:  ED Disposition    ED Disposition Condition [place]: [place] [[id number]]  Level of Care: Progressive [102]  Admit to Progressive based on following criteria: RESPIRATORY PROBLEMS hypoxemic/hypercapnic respiratory failure that is responsive to NIPPV (BiPAP) or High Flow Nasal Cannula (6-80 lpm). Frequent  assessment/intervention, no > Q2 hrs < Q4 hrs, to maintain oxygenation and pulmonary hygiene.  May admit patient to [facility] or [facility] if equivalent level of care is available:: Yes  Interfacility transfer: Yes  Covid Evaluation: Asymptomatic Screening Protocol (No Symptoms)  Diagnosis: COPD with acute exacerbation [facility]) [[id number]]  Admitting Physician: Oswald Hillock [[id number]]  Attending Physician: Oswald Hillock [[id number]]  Estimated length of stay: past midnight tomorrow  Certification:: I certify this patient will need inpatient services for at least 2 midnights         inpatient     I Expect 2 midnight stay secondary to severity of patient's current illness need for inpatient interventions justified by the following:  hemodynamic instability despite optimal treatment (  tachypnea  hypoxia, hypercapnia)  Severe lab/radiological/exam abnormalities including:   CHF and extensive comorbidities including:  DM2   CHF  CAD  COPD/asthma  Morbid Obesity  CKD  That are currently affecting medical management.   I expect  patient to be hospitalized for 2 midnights requiring inpatient medical care.  Patient is at high risk for adverse outcome (such as loss of life or disability) if not treated.  Indication for inpatient stay as follows:    Hemodynamic instability despite maximal medical therapy,    New or worsening hypoxia  Need for IV diuretics   Level of care  Progressive  tele indefinitely please discontinue once patient no longer qualifies COVID-19 Labs    Lab Results  Component Value Date   [place] 03/08/2020     Precautions: admitted as  Covid Negative    PPE: Used by the provider:   N95  eye Goggles,  Gloves    Morgan Fischer 03/08/2020, 11:55 PM    [facility]     after 2 AM please page floor coverage PA If 7AM-7PM, please contact the day team taking care of the patient using [email or web address]   Patient was evaluated in the context of the  global COVID-19 pandemic, which necessitated consideration that the patient might be at risk for infection with the SARS-CoV-2 virus that causes COVID-19. Institutional protocols and algorithms that pertain to the evaluation of patients at risk for COVID-19 are in a state of rapid change based on information released by regulatory bodies including the CDC and federal and state organizations. These policies and algorithms were followed during the patient's care.

## 2020-03-08 NOTE — ED Notes (Signed)
RT offered patient a neb treatment, and she stated she does not feel like she needs one right now. Told her to call us if needed

## 2020-03-09 ENCOUNTER — Encounter (HOSPITAL_COMMUNITY): Payer: Self-pay | Admitting: Internal Medicine

## 2020-03-09 ENCOUNTER — Inpatient Hospital Stay (HOSPITAL_COMMUNITY): Payer: Medicare HMO

## 2020-03-09 DIAGNOSIS — J9601 Acute respiratory failure with hypoxia: Secondary | ICD-10-CM

## 2020-03-09 DIAGNOSIS — I5033 Acute on chronic diastolic (congestive) heart failure: Secondary | ICD-10-CM

## 2020-03-09 LAB — COMPREHENSIVE METABOLIC PANEL
ALT: 24 U/L (ref 0–44)
AST: 22 U/L (ref 15–41)
Albumin: 4 g/dL (ref 3.5–5.0)
Alkaline Phosphatase: 58 U/L (ref 38–126)
Anion gap: 14 (ref 5–15)
BUN: 31 mg/dL — ABNORMAL HIGH (ref 8–23)
CO2: 31 mmol/L (ref 22–32)
Calcium: 9.1 mg/dL (ref 8.9–10.3)
Chloride: 97 mmol/L — ABNORMAL LOW (ref 98–111)
Creatinine, Ser: 0.94 mg/dL (ref 0.44–1.00)
GFR, Estimated: >60 mL/min (ref >60)
Glucose, Bld: 409 mg/dL — ABNORMAL HIGH (ref 70–99)
Potassium: 3.9 mmol/L (ref 3.5–5.1)
Sodium: 142 mmol/L (ref 135–145)
Total Bilirubin: 1 mg/dL (ref 0.3–1.2)
Total Protein: 7 g/dL (ref 6.5–8.1)

## 2020-03-09 LAB — ECHOCARDIOGRAM COMPLETE
Area-P 1/2: 4.49 cm2
Calc EF: 71.1 %
Height: 63.5 in
S' Lateral: 1.7 cm
Single Plane A2C EF: 77.2 %
Single Plane A4C EF: 58.2 %
Weight: 4688 oz

## 2020-03-09 LAB — PHOSPHORUS: Phosphorus: 2.8 mg/dL (ref 2.5–4.6)

## 2020-03-09 LAB — CBC WITH DIFFERENTIAL/PLATELET
Abs Immature Granulocytes: 0.02 10*3/uL (ref 0.00–0.07)
Basophils Absolute: 0 10*3/uL (ref 0.0–0.1)
Basophils Relative: 0 %
Eosinophils Absolute: 0 10*3/uL (ref 0.0–0.5)
Eosinophils Relative: 0 %
HCT: 36.1 % (ref 36.0–46.0)
Hemoglobin: 10.6 g/dL — ABNORMAL LOW (ref 12.0–15.0)
Immature Granulocytes: 0 %
Lymphocytes Relative: 6 %
Lymphs Abs: 0.5 10*3/uL — ABNORMAL LOW (ref 0.7–4.0)
MCH: 27.2 pg (ref 26.0–34.0)
MCHC: 29.4 g/dL — ABNORMAL LOW (ref 30.0–36.0)
MCV: 92.8 fL (ref 80.0–100.0)
Monocytes Absolute: 0.1 10*3/uL (ref 0.1–1.0)
Monocytes Relative: 1 %
Neutro Abs: 7.3 10*3/uL (ref 1.7–7.7)
Neutrophils Relative %: 93 %
Platelets: 135 10*3/uL — ABNORMAL LOW (ref 150–400)
RBC: 3.89 MIL/uL (ref 3.87–5.11)
RDW: 15.9 % — ABNORMAL HIGH (ref 11.5–15.5)
WBC: 7.9 10*3/uL (ref 4.0–10.5)
nRBC: 0 % (ref 0.0–0.2)

## 2020-03-09 LAB — FOLATE: Folate: 36.5 ng/mL (ref >5.9)

## 2020-03-09 LAB — IRON AND TIBC
Iron: 34 ug/dL (ref 28–170)
Saturation Ratios: 9 % — ABNORMAL LOW (ref 10.4–31.8)
TIBC: 395 ug/dL (ref 250–450)
UIBC: 361 ug/dL

## 2020-03-09 LAB — MAGNESIUM: Magnesium: 2 mg/dL (ref 1.7–2.4)

## 2020-03-09 LAB — VITAMIN B12: Vitamin B-12: 1292 pg/mL — ABNORMAL HIGH (ref 180–914)

## 2020-03-09 LAB — FERRITIN: Ferritin: 51 ng/mL (ref 11–307)

## 2020-03-09 LAB — GLUCOSE, CAPILLARY
Glucose-Capillary: 275 mg/dL — ABNORMAL HIGH (ref 70–99)
Glucose-Capillary: 339 mg/dL — ABNORMAL HIGH (ref 70–99)
Glucose-Capillary: 344 mg/dL — ABNORMAL HIGH (ref 70–99)
Glucose-Capillary: 355 mg/dL — ABNORMAL HIGH (ref 70–99)
Glucose-Capillary: 368 mg/dL — ABNORMAL HIGH (ref 70–99)
Glucose-Capillary: 401 mg/dL — ABNORMAL HIGH (ref 70–99)

## 2020-03-09 LAB — HEMOGLOBIN A1C
Hgb A1c MFr Bld: 8.9 % — ABNORMAL HIGH (ref 4.8–5.6)
Mean Plasma Glucose: 208.73 mg/dL

## 2020-03-09 LAB — TROPONIN I (HIGH SENSITIVITY): Troponin I (High Sensitivity): 17 ng/L (ref <18)

## 2020-03-09 LAB — TSH: TSH: 0.662 u[IU]/mL (ref 0.350–4.500)

## 2020-03-09 MED ORDER — LIP MEDEX EX OINT
1.0000 "application " | TOPICAL_OINTMENT | CUTANEOUS | Status: DC | PRN
  Filled 2020-03-09: qty 7

## 2020-03-09 MED ORDER — METOPROLOL SUCCINATE ER 100 MG PO TB24
100.0000 mg | ORAL_TABLET | Freq: Every day | ORAL | Status: DC
  Administered 2020-03-10 – 2020-03-12 (×3): 100 mg via ORAL
  Filled 2020-03-09 (×3): qty 1

## 2020-03-09 MED ORDER — PHENOL 1.4 % MT LIQD
1.0000 | OROMUCOSAL | Status: DC | PRN
  Administered 2020-03-10: 1 via OROMUCOSAL
  Filled 2020-03-09: qty 177

## 2020-03-09 MED ORDER — INSULIN GLARGINE 100 UNIT/ML ~~LOC~~ SOLN
15.0000 [IU] | Freq: Every day | SUBCUTANEOUS | Status: DC
  Administered 2020-03-09 – 2020-03-11 (×3): 15 [IU] via SUBCUTANEOUS
  Filled 2020-03-09 (×3): qty 0.15

## 2020-03-09 MED ORDER — GLIMEPIRIDE 4 MG PO TABS
4.0000 mg | ORAL_TABLET | Freq: Two times a day (BID) | ORAL | Status: DC
  Administered 2020-03-09 – 2020-03-12 (×7): 4 mg via ORAL
  Filled 2020-03-09 (×8): qty 1

## 2020-03-09 NOTE — Evaluation (Signed)
Occupational Therapy Evaluation Patient Details Name: Morgan Fischer MRN: [id number] DOB: 11/19/1950 Today's Date: 03/09/2020    History of Present Illness Morgan Fischer is a 70 y.o. female with medical history significant of diastolic congestive heart failure as well as chronic respiratory dysfunction on 2 L with asthma/COPD.,  CKD, DM2, HTN, gout complete heart block, HLD, hypertrophic obstructive cardiomyopathy, morbid obesity, obstructive sleep apnea. Pt admitted for Acute on chronic respiratory failure with hypoxia and hypercapnia   Clinical Impression   Patient is currently requiring assistance with ADLs including minimal assist with toileting, maximum assist with LE dressing lacking knowledge of adaptive equipment use, minimal to moderate assist with bathing, and setup assist with UE dressing and seated grooming, all of which is below patient's typical baseline of being Independent.  During this evaluation, patient was limited by unsteadiness in standing, decreased activity tolerance, and pt report that she cannot engage in any standing activities due to risk of fall, all of which has the potential to impact patient's safety and independence during functional mobility, as well as performance for ADLs. [employer] AM-PAC "6-clicks" Daily Activity Inpatient Short Form score of 18/24 indicates 46.65% ADL impairment this session. Patient lives with daughter, who is able to provide PRN supervision and assistance and has PCA 4 days a week x4 hours a day.  Patient demonstrates good rehab potential, and should benefit from continued skilled occupational therapy services while in acute care to maximize safety, independence and quality of life at home.  Continued occupational therapy services in the home is recommended.  ?    Follow Up Recommendations  Home health OT;Supervision - Intermittent    Equipment Recommendations  Toilet rise with handles    Recommendations for Other Services        Precautions / Restrictions Precautions Precautions: Fall Precaution Comments: 2-3L O2 [place] baseline Restrictions Weight Bearing Restrictions: No      Mobility Bed Mobility               General bed mobility comments: Pt in recliner Patient Response: Cooperative  Transfers Overall transfer level: Needs assistance Equipment used: None Transfers: Sit to/from [employer] Sit to Stand: Supervision Stand pivot transfers: Min guard       General transfer comment: Pt stood from recliner with supervision, pivoted to [facility] with need of Min guard assist without AD.    Balance Overall balance assessment: Needs assistance   Sitting balance-Leahy Scale: Good     Standing balance support: Single extremity supported Standing balance-Leahy Scale: Poor Standing balance comment: Unsteady                           ADL either performed or assessed with clinical judgement   ADL Overall ADL's : Needs assistance/impaired Eating/Feeding: Independent   Grooming: Set up;Wash/dry hands;Sitting   Upper Body Bathing: Set up;Sitting   Lower Body Bathing: Minimal assistance;Sitting/lateral leans   Upper Body Dressing : Set up;Sitting   Lower Body Dressing: Maximal assistance;Sit to/from stand;Sitting/lateral leans Lower Body Dressing Details (indicate cue type and reason): Max Assist for socks/shoes. Pt has sock aid at home but does not know how to use. Toilet Transfer: BSC;Min guard;Stand-pivot   Toileting- Clothing Manipulation and Hygiene: Set up;Supervision/safety;Sitting/lateral lean   Tub/ Shower Transfer: Minimal assistance;Walk-in shower   Functional mobility during ADLs: Min guard       Vision Baseline Vision/History: Wears glasses Wears Glasses: Reading only Patient Visual Report: No change from baseline Vision Assessment?: No  apparent visual deficits     Perception     Praxis      Pertinent Vitals/Pain Pain Assessment: No/denies pain      Hand Dominance Right   Extremity/Trunk Assessment Upper Extremity Assessment Upper Extremity Assessment: Overall WFL for tasks assessed   Lower Extremity Assessment Lower Extremity Assessment: Generalized weakness   Cervical / Trunk Assessment Cervical / Trunk Assessment: Other exceptions Cervical / Trunk Exceptions: body habitus limiting reaching lower legs   Communication Communication Communication: No difficulties   Cognition Arousal/Alertness: Awake/alert Behavior During Therapy: WFL for tasks assessed/performed Overall Cognitive Status: Within Functional Limits for tasks assessed                                     General Comments       Exercises     Shoulder Instructions      Home Living Family/patient expects to be discharged to:: Private residence Living Arrangements: Other (Comment) (daughter lives with pt and works days) Available Help at Discharge: Family;Available PRN/intermittently;Personal care attendant Type of Home: House Home Access: Ramped entrance     Home Layout: One level     Bathroom Shower/Tub: Producer, television/film/video: Standard Bathroom Accessibility: Yes   Home Equipment: Shower seat;Cane - single point;Wheelchair - manual          Prior Functioning/Environment Level of Independence: Independent with assistive device(s)        Comments: 2L O2 [place] baseline at rest, 3L for activity        OT Problem List: Decreased activity tolerance;Impaired balance (sitting and/or standing);Decreased knowledge of use of DME or AE;Decreased strength      OT Treatment/Interventions: Self-care/ADL training;Therapeutic exercise;Therapeutic activities;Energy conservation;DME and/or AE instruction;Patient/family education;Balance training    OT Goals(Current goals can be found in the care plan section) Acute Rehab OT Goals Patient Stated Goal: Bring O2 needs back down to 2L with activity. OT Goal Formulation: With  patient Time For Goal Achievement: 03/23/20 Potential to Achieve Goals: Good ADL Goals Pt Will Perform Upper Body Bathing: sitting;with modified independence;with adaptive equipment Pt Will Perform Lower Body Dressing: with adaptive equipment;with modified independence;sitting/lateral leans Pt Will Transfer to Toilet: with modified independence;ambulating Pt Will Perform Toileting - Clothing Manipulation and hygiene: with modified independence;sitting/lateral leans Additional ADL Goal #1: Pt will verbalize at least 3 energy conservation techniques to optimize function at home.  OT Frequency: Min 2X/week   Barriers to D/C:            Co-evaluation              AM-PAC OT "6 Clicks" Daily Activity     Outcome Measure Help from another person eating meals?: None Help from another person taking care of personal grooming?: A Little Help from another person toileting, which includes using toliet, bedpan, or urinal?: A Little Help from another person bathing (including washing, rinsing, drying)?: A Little Help from another person to put on and taking off regular upper body clothing?: A Little Help from another person to put on and taking off regular lower body clothing?: A Lot 6 Click Score: 18   End of Session Equipment Utilized During Treatment: Oxygen Nurse Communication: Mobility status  Activity Tolerance: Patient tolerated treatment well Patient left: in chair;with call bell/phone within reach  OT Visit Diagnosis: Unsteadiness on feet (R26.81)  Time: 2330-0762 OT Time Calculation (min): 20 min Charges:  OT General Charges $OT Visit: 1 Visit OT Evaluation $OT Eval Low Complexity: 1 Low  Arelene Moroni, OT Acute Rehab Services Office: [phone] 03/09/2020  Theodoro Clock 03/09/2020, 2:54 PM

## 2020-03-09 NOTE — Progress Notes (Signed)
  Echocardiogram 2D Echocardiogram has been performed.  Janalyn Harder 03/09/2020, 2:00 PM

## 2020-03-09 NOTE — Plan of Care (Signed)
  Problem: Education: Goal: Knowledge of General Education information will improve Description: Including pain rating scale, medication(s)/side effects and non-pharmacologic comfort measures Outcome: Progressing   Problem: Health Behavior/Discharge Planning: Goal: Ability to manage health-related needs will improve Outcome: Progressing   Problem: Clinical Measurements: Goal: Ability to maintain clinical measurements within normal limits will improve Outcome: Progressing Goal: Will remain free from infection Outcome: Progressing Goal: Diagnostic test results will improve Outcome: Progressing Goal: Respiratory complications will improve Outcome: Progressing Goal: Cardiovascular complication will be avoided Outcome: Progressing   Problem: Coping: Goal: Level of anxiety will decrease Outcome: Progressing   Problem: Elimination: Goal: Will not experience complications related to bowel motility Outcome: Progressing Goal: Will not experience complications related to urinary retention Outcome: Progressing   Problem: Pain Managment: Goal: General experience of comfort will improve Outcome: Progressing   Problem: Safety: Goal: Ability to remain free from injury will improve Outcome: Progressing   Problem: Skin Integrity: Goal: Risk for impaired skin integrity will decrease Outcome: Progressing   Problem: Skin Integrity: Goal: Risk for impaired skin integrity will decrease Outcome: Progressing

## 2020-03-09 NOTE — TOC Progression Note (Signed)
Transition of Care [facility]) - Progression Note    Patient Details  Name: Omya Winfield MRN: [id number] Date of Birth: Apr 30, 1950  [facility] CM/SW Contact  Armanda Heritage, RN Phone Number: 03/09/2020, 2:40 PM  Clinical Narrative:    CM spoke with patient re recommendation for HHPT services.  Patient is agreeable to this services.  Referral given to [facility] rep Kathlene November.   Expected Discharge Plan: Home w Home Health Services Barriers to Discharge: No Barriers Identified  Expected Discharge Plan and Services Expected Discharge Plan: Home w Home Health Services   Discharge Planning Services: CM Consult Post Acute Care Choice: Home Health Living arrangements for the past 2 months: Single Family Home                           HH Arranged: PT HH Agency: [facility] Date HH Agency Contacted: 03/09/20 Time HH Agency Contacted: 1440 Representative spoke with at [facility] Agency: Kathlene November   Social Determinants of Health (SDOH) Interventions    Readmission Risk Interventions Readmission Risk Prevention Plan 03/09/2020  Transportation Screening Complete  PCP or Specialist Appt within 3-5 Days Complete  Palliative Care Screening Not Applicable  Medication Review (RN Care Manager) Complete  Some recent data might be hidden

## 2020-03-09 NOTE — Evaluation (Signed)
Physical Therapy Evaluation Patient Details Name: Morgan Fischer MRN: [id number] DOB: 08-May-1950 Today's Date: 03/09/2020   History of Present Illness  Morgan Fischer is a 70 y.o. female with medical history significant of diastolic congestive heart failure as well as chronic respiratory dysfunction on 2 L with asthma/COPD.,  CKD, DM2, HTN, gout complete heart block, HLD, hypertrophic obstructive cardiomyopathy, morbid obesity, obstructive sleep apnea. Pt admitted for Acute on chronic respiratory failure with hypoxia and hypercapnia  Clinical Impression  Pt admitted with above diagnosis.  Pt currently with functional limitations due to the deficits listed below (see PT Problem List). Pt will benefit from skilled PT to increase their independence and safety with mobility to allow discharge to the venue listed below.  Pt reports she is mostly a household ambulator with SPC however does get out to the store occasionally.  Pt states her pulmonologist told her to increased O2 to 3L with activity.  Pt ambulated in hallway on 3L O2 [place] and reports moderate dyspnea upon returning to room.  Pt with improved stability with using RW instead of SPC.     Follow Up Recommendations Home health PT    Equipment Recommendations  None recommended by PT has walker at home   Recommendations for Other Services       Precautions / Restrictions Precautions Precautions: Fall Precaution Comments: 2-3L O2 [place] baseline      Mobility  Bed Mobility               General bed mobility comments: pt sitting EOB on arrival    Transfers Overall transfer level: Needs assistance Equipment used: Straight cane Transfers: Sit to/from Stand Sit to Stand: Min assist;From elevated surface         General transfer comment: slight boost from sitting position (pt reports difficulty with this at baseline)  Ambulation/Gait Ambulation/Gait assistance: Min guard;+2 safety/equipment Gait Distance (Feet): 120 Feet Assistive  device: Rolling walker (2 wheeled) Gait Pattern/deviations: Step-through pattern;Decreased stride length Gait velocity: decr   General Gait Details: initially used SPC however pt using other hand on bed rail for support so provided RW, ambulated on 3L O2 [place] and SPO2 briefly 84% however mostly 94% (returned to 2L at rest)  Information systems manager [place] (Stroke Patients Only)       Balance Overall balance assessment: Needs assistance         Standing balance support: Single extremity supported Standing balance-Leahy Scale: Poor Standing balance comment: reliant on UE support                             Pertinent Vitals/Pain Pain Assessment: No/denies pain    Home Living Family/patient expects to be discharged to:: Private residence Living Arrangements: Other (Comment) (daughter lives with her) Available Help at Discharge: Family;Available PRN/intermittently;Personal care attendant (PCA 4 days/week for 4 hours) Type of Home: House Home Access: Ramped entrance     Home Layout: One level Home Equipment: Shower seat;Cane - single point;Wheelchair - manual      Prior Function Level of Independence: Independent with assistive device(s)         Comments: 2L O2 [place] baseline at rest, 3L for activity     Hand Dominance        Extremity/Trunk Assessment        Lower Extremity Assessment Lower Extremity Assessment: Generalized weakness    Cervical /  Trunk Assessment Cervical / Trunk Assessment: Other exceptions Cervical / Trunk Exceptions: body habitus limiting reaching lower legs  Communication   Communication: No difficulties  Cognition Arousal/Alertness: Awake/alert Behavior During Therapy: WFL for tasks assessed/performed Overall Cognitive Status: Within Functional Limits for tasks assessed                                        General Comments      Exercises     Assessment/Plan    PT  Assessment Patient needs continued PT services  PT Problem List Decreased knowledge of use of DME;Decreased activity tolerance;Decreased strength;Decreased mobility;Cardiopulmonary status limiting activity       PT Treatment Interventions Gait training;DME instruction;Therapeutic exercise;Balance training;Functional mobility training;Therapeutic activities;Patient/family education    PT Goals (Current goals can be found in the Care Plan section)  Acute Rehab PT Goals PT Goal Formulation: With patient Time For Goal Achievement: 03/23/20 Potential to Achieve Goals: Good    Frequency Min 3X/week   Barriers to discharge        Co-evaluation               AM-PAC PT "6 Clicks" Mobility  Outcome Measure Help needed turning from your back to your side while in a flat bed without using bedrails?: A Little Help needed moving from lying on your back to sitting on the side of a flat bed without using bedrails?: A Little Help needed moving to and from a bed to a chair (including a wheelchair)?: A Little Help needed standing up from a chair using your arms (e.g., wheelchair or bedside chair)?: A Little Help needed to walk in hospital room?: A Little Help needed climbing 3-5 steps with a railing? : A Lot 6 Click Score: 17    End of Session Equipment Utilized During Treatment: Oxygen Activity Tolerance: Patient tolerated treatment well Patient left: in chair;with call bell/phone within reach;with chair alarm set Nurse Communication: Mobility status PT Visit Diagnosis: Difficulty in walking, not elsewhere classified (R26.2)    Time: 7209-4709 PT Time Calculation (min) (ACUTE ONLY): 18 min   Charges:   PT Evaluation $PT Eval Low Complexity: 1 Low         Kati PT, DPT Acute Rehabilitation Services Pager: [phone] Office: [phone]  Maida Sale E 03/09/2020, 1:29 PM

## 2020-03-09 NOTE — Progress Notes (Signed)
PROGRESS NOTE  Morgan Fischer  DOB: 12/19/1950  PCP: Jackie Plum, MD DVV:[id number]  DOA: 03/08/2020  LOS: 1 day   Chief Complaint  Patient presents with  . Shortness of Breath   Brief narrative: Klaudia Beirne is a 70 y.o. female with PMH significant for morbid obesity, OSA on nightly BiPAP, HTN, HLD, HOCM, chronic diastolic CHF, COPD on 2 L home oxygen. Patient presented to the ED on 3/7 for evaluation of shortness of breath, hypoxia, worsening leg swelling. In the ED, patient was afebrile, hemodynamically stable Labs with potassium of 3.4 Blood gas with PCO2 elevated to 77 with normal pH at 7.34.  Troponin mildly elevated Chest x-ray showed stable cardiomegaly with pulmonary arterial hypertension. Patient was admitted to hospital service.   Cardiology consultation was obtained.  Subjective: Patient was seen and examined this morning.  Pleasant elderly African-American female.  Sitting up at the edge of the bed.  Not in distress.  On 2 L oxygen by nasal cannula which is her home requirement. Chart reviewed Remains hemodynamically stable Blood sugar running elevated, 275 this morning  Assessment/Plan: Acute on chronic respiratory failure with hypoxia and hypercapnia -Multifactorial -underlying CHF, COPD, OSA -Initial blood gas with PCO2 elevated to 77 with normal pH. -Continue nightly BiPAP and 2 L oxygen during the day -Continue bronchodilators.  Acute on chronic diastolic CHF Essential hypertension HOCM -Recent echo with EF 60 to 65% and confirmation of HOCM -Per cardiology note, patient did not wish further work-up for HOCM -Home meds include Toprol 75 mg daily, Lasix 80 mg twice daily amlodipine 5 mg daily, losartan 100 mg daily, -Currently on IV Lasix 60 mg twice daily.  Continue Toprol and losartan.  Amlodipine on hold. -Net IO Since Admission: -1,600 mL [03/09/20 1110]  -Continue to monitor for daily intake output, weight, blood pressure, BNP, renal function and  electrolytes. Recent Labs  Lab 03/08/20 1214 03/08/20 1215 03/08/20 1233 03/08/20 2245 03/09/20 0222  BNP 450.8*  --   --   --   --   BUN  --  32*  --  29* 31*  CREATININE  --  0.86  --  0.90 0.94  K  --  3.4* 3.4* 4.0 3.9  MG  --   --   --  2.2 2.0   CAD Hyperlipidemia -Continue Crestor  Type 2 diabetes mellitus -A1c 8.9 on 3/7 -Home meds include Lantus 15 units at bedtime, glimepiride 4 mg twice daily, Metformin 500 mg twice daily. -Currently continued on Lantus 10 units.  Oral meds on hold. -Based on elevated blood sugar trend, I will increase Lantus to 15 units at bedtime and resume glimepiride.  Keep Metformin on hold for now. Recent Labs  Lab 03/08/20 2037 03/08/20 2348 03/09/20 0409 03/09/20 0801  GLUCAP 239* 251* 355* 275*   Morbid obesity  -Body mass index is 51.09 kg/m. Patient has been advised to make an attempt to improve diet and exercise patterns to aid in weight loss.  Gout  -continue allopurinol stable  Mobility: Encourage ambulation.  Ambulates at home with a cane.  PT ordered Code Status:   Code Status: Full Code  Nutritional status: Body mass index is 51.09 kg/m.     Diet Order            Diet Carb Modified Fluid consistency: Thin; Room service appropriate? Yes  Diet effective now                 DVT prophylaxis: enoxaparin (LOVENOX) injection 40 mg Start:  03/08/20 2245   Antimicrobials:  None Fluid: None Consultants: Cardiology Family Communication:  None at bedside  Status is: Inpatient   Remains inpatient appropriate because -continues to require diuresis  Dispo: The patient is from: Home              Anticipated d/c is to: Home              Patient currently is not medically stable to d/c.   Difficult to place patient No       Infusions:  . sodium chloride      Scheduled Meds: . allopurinol  300 mg Oral Daily  . aspirin EC  81 mg Oral Daily  . enoxaparin (LOVENOX) injection  40 mg Subcutaneous Q24H  .  fluticasone furoate-vilanterol  1 puff Inhalation Daily  . furosemide  60 mg Intravenous Q12H  . glimepiride  4 mg Oral BID WC  . insulin aspart  0-15 Units Subcutaneous Q4H  . insulin glargine  15 Units Subcutaneous QHS  . ipratropium-albuterol  3 mL Inhalation Q6H  . losartan  100 mg Oral Daily  . metoprolol succinate  50 mg Oral Daily  . montelukast  10 mg Oral QHS  . predniSONE  40 mg Oral Q breakfast  . rosuvastatin  40 mg Oral QHS  . sodium chloride flush  3 mL Intravenous Q12H    Antimicrobials: Anti-infectives (From admission, onward)   None      PRN meds: sodium chloride, acetaminophen **OR** acetaminophen, albuterol, albuterol, HYDROcodone-acetaminophen, polyethylene glycol, sodium chloride flush   Objective: Vitals:   03/09/20 0804 03/09/20 0918  BP: 136/60   Pulse: 67   Resp: 20   Temp:    SpO2:  98%    Intake/Output Summary (Last 24 hours) at 03/09/2020 1110 Last data filed at 03/09/2020 0304 Gross per 24 hour  Intake --  Output 1600 ml  Net -1600 ml   Filed Weights   03/08/20 1030  Weight: 132.9 kg   Weight change:  Body mass index is 51.09 kg/m.   Physical Exam: General exam: Pleasant elderly morbidly obese African-American female. Skin: No rashes, lesions or ulcers. HEENT: Atraumatic, normocephalic, no obvious bleeding Lungs: Clear to auscultation bilaterally CVS: Regular rate and rhythm, no murmur GI/Abd soft, nontender, nondistended, bowel sound present CNS: Alert, awake, oriented x3 Psychiatry: Mood appropriate Extremities: Pedal edema trace bilaterally  Data Review: I have personally reviewed the laboratory data and studies available.  Recent Labs  Lab 03/08/20 1215 03/08/20 1233 03/09/20 0222  WBC 8.4  --  7.9  NEUTROABS 6.1  --  7.3  HGB 10.8* 11.9* 10.6*  HCT 36.1 35.0* 36.1  MCV 91.6  --  92.8  PLT 138*  --  135*   Recent Labs  Lab 03/08/20 1215 03/08/20 1233 03/08/20 2245 03/09/20 0222  NA 144 145 143 142  K 3.4*  3.4* 4.0 3.9  CL 96*  --  96* 97*  CO2 36*  --  34* 31  GLUCOSE 165*  --  282* 409*  BUN 32*  --  29* 31*  CREATININE 0.86  --  0.90 0.94  CALCIUM 9.4  --  9.5 9.1  MG  --   --  2.2 2.0  PHOS  --   --   --  2.8    F/u labs ordered Unresulted Labs (From admission, onward)          Start     Ordered   03/08/20 2148  Expectorated sputum assessment w rflx  to resp cult  (COPD / Pneumonia )  Once,   R        03/08/20 2151   Unscheduled  Occult blood card to lab, stool  As needed,   R      03/08/20 2152   Unscheduled  Basic metabolic panel  Tomorrow morning,   R        03/09/20 1110   Unscheduled  CBC with Differential/Platelet  Tomorrow morning,   R        03/09/20 1110   Unscheduled  Magnesium  Tomorrow morning,   STAT        03/09/20 1110   Unscheduled  Phosphorus  Tomorrow morning,   R        03/09/20 1110          Signed, Lorin Glass, MD [facility] 03/09/2020

## 2020-03-09 NOTE — Plan of Care (Signed)

## 2020-03-09 NOTE — Progress Notes (Signed)
Inpatient Diabetes Program Recommendations  AACE/ADA: New Consensus Statement on Inpatient Glycemic Control (2015)  Target Ranges:  Prepandial:   less than 140 mg/dL      Peak postprandial:   less than 180 mg/dL (1-2 hours)      Critically ill patients:  140 - 180 mg/dL   Lab Results  Component Value Date   GLUCAP 275 (H) 03/09/2020   HGBA1C 8.9 (H) 03/08/2020    Review of Glycemic Control Results for Morgan Fischer, Morgan Fischer (MRN [id number]) as of 03/09/2020 12:13  Ref. Range 03/08/2020 20:37 03/08/2020 23:48 03/09/2020 04:09 03/09/2020 08:01  Glucose-Capillary Latest Ref Range: 70 - 99 mg/dL 657 (H) 846 (H) 962 (H) 275 (H)   Diabetes history: DM2 Outpatient Diabetes medications: Lantus 15 units + Amaryl 4 mg bid + Metformin 500 mg bid Current orders for Inpatient glycemic control: Lantus 15 units + Novolog 0-15 units q 4 hrs + Amaryl 4 mg qd  Inpatient Diabetes Program Recommendations:   Sent note to Dr. Pola Corn to request Lantus be given now instead of waiting until hs.  Thank you, Billy Fischer. Pura Picinich, RN, MSN, CDE  Diabetes Coordinator Inpatient Glycemic Control Team Team Pager [phone] (8am-5pm) 03/09/2020 12:25 PM

## 2020-03-09 NOTE — Consult Note (Signed)
Cardiology Consultation:   Patient ID: Morgan Fischer MRN: [id number]; DOB: 04/07/1950  Admit date: 03/08/2020 Date of Consult: 03/09/2020  PCP:  Osei-Bonsu, George, MD   [facility]  Cardiologist:  Rajan R Revankar, MD   Advanced Practice Provider:  No care team member to display Electrophysiologist:  None   { Click here to update Patient Care Team and Refresh Note - MD (PCP) or APP (Team Member)  Change PCP Type for MD, Specialty for APP is either Cardiology or Clinical Cardiac Electrophysiology  :[id number]}    Patient Profile:   Morgan Fischer is a 69 y.o. female with a hx of HTN, CAD, HOCM, chronic diastolic HF, HLD, CKD, obesity and COPD with home 02 now admitted with SOB and HTN who is being seen today for the evaluation of acute on chronic respiratory failure with hypoxia and CHF at the request of Dr. Dahal.  History of Present Illness:   [name] with above hx and last echo 07/16/19 with severe intracavitary gradient. pk velocity 4.67 and pk gradient 87.2 mmHg. systolic ant motion of the mitral valve consistent with HOCM.  EF 60-65%.  No RWMA.  Severe concentric LVH. G2DD.  RV normal.  No AS. No MR and ascending aortic dilatation of 38 mm.  Pt was stable on last cardiology visit in Nov 2021 and did not wish further testing at that time.  Her problem list notes CAD but I did not find cath or cardiac CTA.  It was noted in 2018.  She has several risk factors for CAD with DM, HTN, obesity   Pt admitted yesterday after presenting with about of week of SOB and 2 pillow orthopnea though this is chronic. Her sp02 has been dropping at home as well.  Minimal leg swelling.  She does have sleep apnea and uses BIPAP every night. She has been taking her lasix 80 mg BID and has not had salty foods.  Denies chest pain.   Negative covid.     EKG:  The EKG was personally reviewed and demonstrates:  SR at 81 with LVH, inverted T waves I, V3-V6 no change from 07/14/19  Telemetry:   Telemetry was personally reviewed and demonstrates:  SR at 91  BNP 450 hs troponin 27 Na 142 K+ 3.9 glucose 409 BUN 31 Cr 0.94 K+ 3.9  Mg+ 2.0  LFTs WNL  Hgb 10.6, WBC 7.9 plts 135  DDimer 0.54 A1C 8.9   TSH 0.662   PCXR IMPRESSION: Stable cardiomegaly with pulmonary arterial hypertension. Lungs clear. No adenopathy appreciable. Aortic Atherosclerosis (ICD10-I70.0).  VS  BP 145/70 P 74 Resp 22 afebrile.  Has rec'd since admit 100 mg total of lasix. And nowon 60 mg BID IV.  Past Medical History:  Diagnosis Date  . Acute diastolic heart failure (HCC) 07/17/2019  . Acute hypercapnic respiratory failure (HCC) 07/21/2016  . Acute on chronic congestive heart failure (HCC) 07/19/2016  . Acute on chronic respiratory failure with hypoxia (HCC) 10/20/2018  . Acute respiratory acidosis 07/19/2016  . Acute respiratory distress 07/19/2016  . Aortic stenosis 11/01/2018  . Arthritis   . Asthma   . Atherosclerosis of native coronary artery of native heart without angina pectoris 03/17/2016  . Chest pain 07/14/2019  . CHF (congestive heart failure) (HCC)   . Chronic diastolic heart failure (HCC) 03/17/2016  . Chronic hypoxemic respiratory failure (HCC)   . CKD (chronic kidney disease)   . COPD (chronic obstructive pulmonary disease) (HCC)   . Diabetes mellitus without complication (  HCC)   . Drug allergy 07/21/2016  . Essential (primary) hypertension 11/19/2016  . Gout 11/19/2016  . Heart block AV complete (HCC)   . High cholesterol   . HOCM (hypertrophic obstructive cardiomyopathy) (HCC) 09/18/2017  . Hyperglycemia due to type 2 diabetes mellitus (HCC) 07/19/2016  . Hypertension   . Hypertensive heart disease with heart failure (HCC) 03/17/2016  . Hypokalemia 08/24/2015  . Hypomagnesemia   . Hypoxia 03/17/2016  . Leukocytosis 07/19/2016  . Morbid obesity (HCC)   . Nonrheumatic aortic valve stenosis 03/17/2016  . Obstructive sleep apnea 11/19/2016  . Panniculitis 08/23/2015  . Pure  hypercholesterolemia 02/13/2017  . SOB (shortness of breath) 08/24/2015  . Troponin level elevated 08/24/2015  . Type 2 diabetes mellitus, without long-term current use of insulin (HCC) 11/19/2016    Past Surgical History:  Procedure Laterality Date  . ABDOMINAL HYSTERECTOMY    . CESAREAN SECTION       Home Medications:  Prior to Admission medications   Medication Sig Start Date End Date Taking? Authorizing Provider  ACCU-CHEK AVIVA PLUS test strip 1 each 4 (four) times daily. 02/12/20  Yes [provider]  allopurinol (ZYLOPRIM) 300 MG tablet Take 300 mg by mouth daily.   Yes [provider]  amLODipine (NORVASC) 5 MG tablet Take 5 mg by mouth daily. 07/03/19  Yes [provider]  aspirin EC 81 MG EC tablet Take 1 tablet (81 mg total) by mouth daily. 10/25/18  Yes Patel, Pranav M, MD  BREO ELLIPTA 200-25 MCG/INH AEPB Inhale 1 puff into the lungs daily. 01/26/20  Yes [provider]  Cholecalciferol 50 MCG (2000 UT) CAPS Take 2,000 Units by mouth daily.   Yes [provider]  Cyanocobalamin (VITAMIN B-12 PO) Take 1 tablet by mouth daily.   Yes [provider]  ferrous sulfate 325 (65 FE) MG EC tablet Take 1 tablet by mouth daily. 07/11/19  Yes [provider]  furosemide (LASIX) 80 MG tablet Take 1 tablet (80 mg total) by mouth 2 (two) times daily. 07/30/19 10/28/19 Yes Revankar, Rajan R, MD  Garlic 1000 MG CAPS Take 1,000 mg by mouth daily.   Yes [provider]  glimepiride (AMARYL) 4 MG tablet Take 4 mg by mouth 2 (two) times daily.   Yes [provider]  hydrOXYzine (ATARAX/VISTARIL) 25 MG tablet Take 25 mg by mouth at bedtime.   Yes [provider]  ipratropium-albuterol (DUONEB) 0.5-2.5 (3) MG/3ML SOLN Inhale 3 mLs into the lungs every 4 (four) hours as needed (wheezing).   Yes Beauford, Wayne, MD  LANTUS SOLOSTAR 100 UNIT/ML Solostar Pen Inject 15 Units into the skin at bedtime. 01/21/19  Yes [provider]  losartan (COZAAR) 100 MG tablet Take 1 tablet by mouth daily.   Yes Bonsu, Osei A, DO  metFORMIN (GLUCOPHAGE) 500 MG tablet Take 500 mg by mouth 2 (two) times daily with a meal.   Yes [provider]  metoprolol succinate (TOPROL-XL) 25 MG 24 hr tablet Take 1 tablet (25 mg total) by mouth daily. Patient taking differently: Take 25 mg by mouth daily. Takes along with 50 mg tablet daily to total 75 mg 02/10/20  Yes Revankar, Rajan R, MD  metoprolol succinate (TOPROL-XL) 50 MG 24 hr tablet Take 1 tablet (50 mg total) by mouth daily. Patient taking differently: Take 50 mg by mouth daily. Takes along with 25 mg to total 75 mg daily 02/10/20  Yes Revankar, Rajan R, MD  montelukast (SINGULAIR) 10 MG tablet   Take 10 mg by mouth at bedtime.   Yes [provider]  Multiple Vitamin (MULTIVITAMIN WITH MINERALS) TABS tablet Take 1 tablet by mouth daily.   Yes [provider]  nitroGLYCERIN (NITROSTAT) 0.4 MG SL tablet Place 1 tablet (0.4 mg total) under the tongue every 5 (five) minutes as needed for chest pain. 04/25/19  Yes Revankar, Reita Cliche, MD  Omega-3 Fatty Acids (FISH OIL) 1000 MG CAPS Take 1,000 mg by mouth 2 (two) times daily.    Yes [provider]  OXYGEN Inhale 2 L into the lungs continuous.   Yes [provider]  polyethylene glycol (MIRALAX / GLYCOLAX) 17 g packet Take 17 g by mouth daily. Patient taking differently: Take 17 g by mouth daily as needed. 10/25/18  Yes Lavina Hamman, MD  potassium chloride SA (KLOR-CON) 20 MEQ tablet Take 20 mEq by mouth daily. 03/04/20  Yes [provider]  rosuvastatin (CRESTOR) 40 MG tablet Take 40 mg by mouth at bedtime.   Yes [provider]  Turmeric 500 MG CAPS Take 1 capsule by mouth daily.   Yes [provider]  VENTOLIN HFA 108 (90 Base) MCG/ACT inhaler Inhale 2 puffs into the lungs every 6 (six) hours as needed for wheezing or shortness of breath.  07/03/18  Yes [provider]  BD PEN NEEDLE NANO 2ND GEN 32G X 4 MM MISC 1 each by Other route as directed.  12/31/18   [provider]  Blood Glucose Monitoring Suppl (ACCU-CHEK AVIVA PLUS) w/Device KIT 1 each by Other route as directed.  12/20/18   [provider]    Inpatient Medications: Scheduled Meds: . allopurinol  300 mg Oral Daily  . aspirin EC  81 mg Oral Daily  . enoxaparin (LOVENOX) injection  40 mg Subcutaneous Q24H  . fluticasone furoate-vilanterol  1 puff Inhalation Daily  . furosemide  60 mg Intravenous Q12H  . insulin aspart  0-15 Units Subcutaneous Q4H  . insulin glargine  10 Units Subcutaneous QHS  . ipratropium-albuterol  3 mL Inhalation Q6H  . losartan  100 mg Oral Daily  . metoprolol succinate  50 mg Oral Daily  . montelukast  10 mg Oral QHS  . predniSONE  40 mg Oral Q breakfast  . rosuvastatin  40 mg Oral QHS  . sodium chloride flush  3 mL Intravenous Q12H   Continuous Infusions: . sodium chloride     PRN Meds: sodium chloride, acetaminophen **OR** acetaminophen, albuterol, albuterol, HYDROcodone-acetaminophen, polyethylene glycol, sodium chloride flush  Allergies:    Allergies  Allergen Reactions  . Atorvastatin Swelling    Lip swelling  . Ciprocinonide [Fluocinolone] Other (See Comments)    Kidney failure  . Catapres [Clonidine Hcl] Rash  . Demadex [Torsemide] Rash    Social History:   Social History   Socioeconomic History  . Marital status: Widowed    Spouse name: Not on file  . Number of children: Not on file  . Years of education: Not on file  . Highest education level: Not on file  Occupational History  . Not on file  Tobacco Use  . Smoking status: Never Smoker  . Smokeless tobacco: Never Used  Vaping Use  . Vaping Use: Never used  Substance and Sexual Activity  . Alcohol use: No  . Drug use: No  . Sexual activity: Never  Other Topics Concern  . Not on file  Social History Narrative  . Not on file   Social Determinants  of Health  Financial Resource Strain: Not on file  Food Insecurity: Not on file  Transportation Needs: Not on file  Physical Activity: Not on file  Stress: Not on file  Social Connections: Not on file  Intimate Partner Violence: Not on file    Family History:    Family History  Problem Relation Age of Onset  . Diabetes Mellitus I Mother   . CAD Mother   . Diabetes Mother   . Heart disease Father   . Cancer Father   . Breast cancer Sister   . Cancer Sister      ROS:  Please see the history of present illness.  General:no colds or fevers, no weight changes Skin:no rashes or ulcers HEENT:no blurred vision, no congestion CV:see HPI PUL:see HPI GI:no diarrhea constipation or melena, no indigestion GU:no hematuria, no dysuria MS:no joint pain, no claudication Neuro:no syncope, no lightheadedness Endo:+no diabetes, no thyroid disease  All other ROS reviewed and negative.     Physical Exam/Data:   Vitals:   03/08/20 2036 03/09/20 0146 03/09/20 0156 03/09/20 0429  BP: (!) 162/75 (!) 150/72  (!) 145/70  Pulse: 94 80  78  Resp: _0 Temp: 98.6 F (37 C) 98.3 F (36.8 C)  98.1 F (36.7 C)  TempSrc:      SpO2: 100% 97% 97% 100%  Weight:      Height:        Intake/Output Summary (Last 24 hours) at 03/09/2020 0709 Last data filed at 03/09/2020 0304 Gross per 24 hour  Intake -  Output 1600 ml  Net -1600 ml   Last 3 Weights 03/08/2020 11/10/2019 07/30/2019  Weight (lbs) 293 lb 289 lb 0.6 oz 290 lb 1.3 oz  Weight (kg) 132.904 kg 131.108 kg 131.579 kg     Body mass index is 51.09 kg/m.  EXAM per Dr Oval Linsey  Relevant CV Studies: Echo 07/16/19 IMPRESSIONS    1. Severe intracavitary gradient. Peak velocity 4.67 m/s. Peak gradient  87.2 mmHg. Systolic anterior motion of the mitral valve. Findings  consistent with HOCM. Left ventricular ejection fraction, by estimation,  is 60 to 65%. The left ventricle has  normal function. The left ventricle has no regional  wall motion  abnormalities. There is severe concentric left ventricular hypertrophy.  Left ventricular diastolic parameters are consistent with Grade II  diastolic dysfunction (pseudonormalization).  2. Right ventricular systolic function is normal. The right ventricular  size is normal.  3. Left atrial size was mildly dilated.  4. The mitral valve is normal in structure. No evidence of mitral valve  regurgitation. No evidence of mitral stenosis.  5. The aortic valve is normal in structure. Aortic valve regurgitation is  not visualized. No aortic stenosis is present.  6. Aortic dilatation noted. There is mild dilatation of the ascending  aorta measuring 38 mm.  7. The inferior vena cava is dilated in size with >50% respiratory  variability, suggesting right atrial pressure of 8 mmHg.   FINDINGS  Left Ventricle: Severe intracavitary gradient. Peak velocity 4.67 m/s.  Peak gradient 87.2 mmHg. Systolic anterior motion of the mitral valve.  Findings consistent with HOCM. Left ventricular ejection fraction, by  estimation, is 60 to 65%. The left  ventricle has normal function. The left ventricle has no regional wall  motion abnormalities. The left ventricular internal cavity size was normal  in size. There is severe concentric left ventricular hypertrophy. Left  ventricular diastolic parameters are  consistent with Grade II diastolic dysfunction (pseudonormalization).  Laboratory Data:  High Sensitivity Troponin:   Recent Labs  Lab 03/08/20 1214 03/08/20 2059 03/08/20 2245 03/09/20 0222  TROPONINIHS 27*  30* 22* 24* 17     Chemistry Recent Labs  Lab 03/08/20 1215 03/08/20 1233 03/08/20 2245 03/09/20 0222  NA 144 145 143 142  K 3.4* 3.4* 4.0 3.9  CL 96*  --  96* 97*  CO2 36*  --  34* 31  GLUCOSE 165*  --  282* 409*  BUN 32*  --  29* 31*  CREATININE 0.86  --  0.90 0.94  CALCIUM 9.4  --  9.5 9.1  GFRNONAA >60  --  >60 >60  ANIONGAP 12  --  13 14    Recent Labs   Lab 03/08/20 1215 03/09/20 0222  PROT 7.4 7.0  ALBUMIN 4.3 4.0  AST 17 22  ALT 19 24  ALKPHOS 52 58  BILITOT 0.6 1.0   Hematology Recent Labs  Lab 03/08/20 1215 03/08/20 1233 03/08/20 2245 03/09/20 0222  WBC 8.4  --   --  7.9  RBC 3.94  --  4.05 3.89  HGB 10.8* 11.9*  --  10.6*  HCT 36.1 35.0*  --  36.1  MCV 91.6  --   --  92.8  MCH 27.4  --   --  27.2  MCHC 29.9*  --   --  29.4*  RDW 15.9*  --   --  15.9*  PLT 138*  --   --  135*   BNP Recent Labs  Lab 03/08/20 1214  BNP 450.8*    DDimer  Recent Labs  Lab 03/08/20 2245  DDIMER 0.54*     Radiology/Studies:  DG Chest Portable 1 View  Result Date: 03/08/2020 CLINICAL DATA:  Shortness of breath EXAM: PORTABLE CHEST 1 VIEW COMPARISON:  July 14, 2019 FINDINGS: Lungs are clear. There is cardiomegaly with central pulmonary vascular prominence and rapid peripheral tapering. No evident adenopathy. There is aortic atherosclerosis. No bone lesions. IMPRESSION: Stable cardiomegaly with pulmonary arterial hypertension. Lungs clear. No adenopathy appreciable. Aortic Atherosclerosis (ICD10-I70.0). Electronically Signed   By: William  Woodruff III M.D.   On: 03/08/2020 11:07     Assessment and Plan:   1. Respiratory failure acute with hypoxia/COPD with home 02 and OSA/ Asthma per IM 2. HOCM/diastolic HF neg 1600 since admit.  Dr. [place] to see. Continue 60 mg IV BID for now.  Pt did not wish further work up for HOCM in Nov. 3. Htn elevated on home meds  Amlodipine 5 mg daily, losartan 100 mg daily, toprol XL total 75 mg daily- though currently on 50 mg ,  4. HLD on statin continue 5. CAD in problem list but I do not see cath or cardiac CTA to confirm though multiple risk factors.  Troponin hs 27 may be demand ischemia from HTN and dyspnea  She is on ASA and statin 6. DM-2 on insulin per IM   Risk Assessment/Risk Scores:        New York Heart Association (NYHA) Functional Class NYHA Class III        For questions  or updates, please contact [facility] Please consult [email or web address] for contact info under    Signed, Laura Ingold, NP  03/09/2020 7:09 AM 

## 2020-03-10 DIAGNOSIS — I10 Essential (primary) hypertension: Secondary | ICD-10-CM

## 2020-03-10 DIAGNOSIS — I421 Obstructive hypertrophic cardiomyopathy: Secondary | ICD-10-CM

## 2020-03-10 DIAGNOSIS — I5033 Acute on chronic diastolic (congestive) heart failure: Secondary | ICD-10-CM

## 2020-03-10 LAB — BASIC METABOLIC PANEL
Anion gap: 14 (ref 5–15)
BUN: 36 mg/dL — ABNORMAL HIGH (ref 8–23)
CO2: 31 mmol/L (ref 22–32)
Calcium: 9.1 mg/dL (ref 8.9–10.3)
Chloride: 95 mmol/L — ABNORMAL LOW (ref 98–111)
Creatinine, Ser: 1.13 mg/dL — ABNORMAL HIGH (ref 0.44–1.00)
GFR, Estimated: 52 mL/min — ABNORMAL LOW (ref >60)
Glucose, Bld: 261 mg/dL — ABNORMAL HIGH (ref 70–99)
Potassium: 3.8 mmol/L (ref 3.5–5.1)
Sodium: 140 mmol/L (ref 135–145)

## 2020-03-10 LAB — CBC WITH DIFFERENTIAL/PLATELET
Abs Immature Granulocytes: 0.06 10*3/uL (ref 0.00–0.07)
Basophils Absolute: 0 10*3/uL (ref 0.0–0.1)
Basophils Relative: 0 %
Eosinophils Absolute: 0 10*3/uL (ref 0.0–0.5)
Eosinophils Relative: 0 %
HCT: 35.4 % — ABNORMAL LOW (ref 36.0–46.0)
Hemoglobin: 10.4 g/dL — ABNORMAL LOW (ref 12.0–15.0)
Immature Granulocytes: 1 %
Lymphocytes Relative: 12 %
Lymphs Abs: 1.3 10*3/uL (ref 0.7–4.0)
MCH: 27.1 pg (ref 26.0–34.0)
MCHC: 29.4 g/dL — ABNORMAL LOW (ref 30.0–36.0)
MCV: 92.2 fL (ref 80.0–100.0)
Monocytes Absolute: 0.8 10*3/uL (ref 0.1–1.0)
Monocytes Relative: 7 %
Neutro Abs: 9 10*3/uL — ABNORMAL HIGH (ref 1.7–7.7)
Neutrophils Relative %: 80 %
Platelets: 156 10*3/uL (ref 150–400)
RBC: 3.84 MIL/uL — ABNORMAL LOW (ref 3.87–5.11)
RDW: 15.9 % — ABNORMAL HIGH (ref 11.5–15.5)
WBC: 11.2 10*3/uL — ABNORMAL HIGH (ref 4.0–10.5)
nRBC: 0 % (ref 0.0–0.2)

## 2020-03-10 LAB — MAGNESIUM: Magnesium: 2.3 mg/dL (ref 1.7–2.4)

## 2020-03-10 LAB — GLUCOSE, CAPILLARY
Glucose-Capillary: 118 mg/dL — ABNORMAL HIGH (ref 70–99)
Glucose-Capillary: 147 mg/dL — ABNORMAL HIGH (ref 70–99)
Glucose-Capillary: 185 mg/dL — ABNORMAL HIGH (ref 70–99)
Glucose-Capillary: 207 mg/dL — ABNORMAL HIGH (ref 70–99)
Glucose-Capillary: 213 mg/dL — ABNORMAL HIGH (ref 70–99)
Glucose-Capillary: 221 mg/dL — ABNORMAL HIGH (ref 70–99)

## 2020-03-10 LAB — PHOSPHORUS: Phosphorus: 3.1 mg/dL (ref 2.5–4.6)

## 2020-03-10 MED ORDER — DISOPYRAMIDE PHOSPHATE 100 MG PO CAPS
100.0000 mg | ORAL_CAPSULE | Freq: Four times a day (QID) | ORAL | Status: DC
Start: 2020-03-10 — End: 2020-03-12
  Administered 2020-03-10 – 2020-03-12 (×8): 100 mg via ORAL
  Filled 2020-03-10 (×9): qty 1

## 2020-03-10 MED ORDER — INSULIN ASPART 100 UNIT/ML ~~LOC~~ SOLN
3.0000 [IU] | Freq: Three times a day (TID) | SUBCUTANEOUS | Status: DC
  Administered 2020-03-10 – 2020-03-12 (×7): 3 [IU] via SUBCUTANEOUS

## 2020-03-10 NOTE — Discharge Instructions (Signed)

## 2020-03-10 NOTE — Progress Notes (Addendum)
Progress Note  Patient Name: Morgan Fischer Date of Encounter: 03/10/2020  [facility] Cardiologist: Garwin Brothers, MD   Subjective   Feeling well. Breathing is improving.   Inpatient Medications    Scheduled Meds: . allopurinol  300 mg Oral Daily  . aspirin EC  81 mg Oral Daily  . enoxaparin (LOVENOX) injection  40 mg Subcutaneous Q24H  . fluticasone furoate-vilanterol  1 puff Inhalation Daily  . furosemide  60 mg Intravenous Q12H  . glimepiride  4 mg Oral BID WC  . insulin aspart  0-15 Units Subcutaneous Q4H  . insulin aspart  3 Units Subcutaneous TID WC  . insulin glargine  15 Units Subcutaneous QHS  . ipratropium-albuterol  3 mL Inhalation Q6H  . losartan  100 mg Oral Daily  . metoprolol succinate  100 mg Oral Daily  . montelukast  10 mg Oral QHS  . rosuvastatin  40 mg Oral QHS  . sodium chloride flush  3 mL Intravenous Q12H   Continuous Infusions: . sodium chloride     PRN Meds: sodium chloride, acetaminophen **OR** acetaminophen, albuterol, albuterol, HYDROcodone-acetaminophen, lip balm, phenol, polyethylene glycol, sodium chloride flush   Vital Signs    Vitals:   03/09/20 2000 03/10/20 0442 03/10/20 0444 03/10/20 0757  BP: (!) 158/64 133/68    Pulse: 69 (!) 57    Resp: 20 18    Temp: 98.3 F (36.8 C) 97.7 F (36.5 C)    TempSrc: Oral Oral    SpO2: 100% 100%  99%  Weight:   134.5 kg   Height:        Intake/Output Summary (Last 24 hours) at 03/10/2020 1108 Last data filed at 03/10/2020 0600 Gross per 24 hour  Intake 960 ml  Output 2350 ml  Net -1390 ml   Last 3 Weights 03/10/2020 03/09/2020 03/08/2020  Weight (lbs) 296 lb 8.3 oz 293 lb 14 oz 293 lb  Weight (kg) 134.5 kg 133.3 kg 132.904 kg      Telemetry    Sinus rhythm.  PVC  No events.- Personally Reviewed  ECG    n/a - Personally Reviewed  Physical Exam   VS:  BP 133/68 (BP Location: Left Arm)   Pulse (!) 57   Temp 97.7 F (36.5 C) (Oral)   Resp 18   Ht 5' 3.5" (1.613 m)   Wt 134.5  kg   LMP  (LMP Unknown)   SpO2 99%   BMI 51.70 kg/m  , BMI Body mass index is 51.7 kg/m. GENERAL:  Well appearing HEENT: Pupils equal round and reactive, fundi not visualized, oral mucosa unremarkable NECK:  No jugular venous distention, waveform within normal limits, carotid upstroke brisk and symmetric, no bruits LUNGS:  Clear to auscultation bilaterally HEART:  RRR.  PMI not displaced or sustained,S1 and S2 within normal limits, no S3, no S4, no clicks, no rubs, III/VI systolic murmur at the LLSB ABD:  Flat, positive bowel sounds normal in frequency in pitch, no bruits, no rebound, no guarding, no midline pulsatile mass, no hepatomegaly, no splenomegaly EXT:  2 plus pulses throughout, woody LE edema, no cyanosis no clubbing SKIN:  No rashes no nodules NEURO:  Cranial nerves II through XII grossly intact, motor grossly intact throughout PSYCH:  Cognitively intact, oriented to person place and time   Labs    High Sensitivity Troponin:   Recent Labs  Lab 03/08/20 1214 03/08/20 2059 03/08/20 2245 03/09/20 0222  TROPONINIHS 27*  30* 22* 24* 17  Chemistry Recent Labs  Lab 03/08/20 1215 03/08/20 1233 03/08/20 2245 03/09/20 0222 03/10/20 0351  NA 144   < > 143 142 140  K 3.4*   < > 4.0 3.9 3.8  CL 96*  --  96* 97* 95*  CO2 36*  --  34* 31 31  GLUCOSE 165*  --  282* 409* 261*  BUN 32*  --  29* 31* 36*  CREATININE 0.86  --  0.90 0.94 1.13*  CALCIUM 9.4  --  9.5 9.1 9.1  PROT 7.4  --   --  7.0  --   ALBUMIN 4.3  --   --  4.0  --   AST 17  --   --  22  --   ALT 19  --   --  24  --   ALKPHOS 52  --   --  58  --   BILITOT 0.6  --   --  1.0  --   GFRNONAA >60  --  >60 >60 52*  ANIONGAP 12  --  13 14 14    < > = values in this interval not displayed.     Hematology Recent Labs  Lab 03/08/20 1215 03/08/20 1233 03/08/20 2245 03/09/20 0222 03/10/20 0351  WBC 8.4  --   --  7.9 11.2*  RBC 3.94  --  4.05 3.89 3.84*  HGB 10.8* 11.9*  --  10.6* 10.4*  HCT 36.1 35.0*   --  36.1 35.4*  MCV 91.6  --   --  92.8 92.2  MCH 27.4  --   --  27.2 27.1  MCHC 29.9*  --   --  29.4* 29.4*  RDW 15.9*  --   --  15.9* 15.9*  PLT 138*  --   --  135* 156    BNP Recent Labs  Lab 03/08/20 1214  BNP 450.8*     DDimer  Recent Labs  Lab 03/08/20 2245  DDIMER 0.54*     Radiology    ECHOCARDIOGRAM COMPLETE  Result Date: 03/09/2020    ECHOCARDIOGRAM REPORT   Patient Name:   Hollie BeachJULIA Kattner Date of Exam: 03/09/2020 Medical Rec #:  [id number]    Height:       63.5 in Accession #:    [id number]   Weight:       293.0 lb Date of Birth:  01/31/1950    BSA:          2.288 m Patient Age:    70 years     BP:           145/70 mmHg Patient Gender: F            HR:           97 bpm. Exam Location:  Inpatient Procedure: 2D Echo, 3D Echo, Cardiac Doppler and Color Doppler Indications:    I50.40* Unspecified combined systolic (congestive) and diastolic                 (congestive) heart failure  History:        Patient has prior history of Echocardiogram examinations, most                 recent 07/16/2019. CHF and Cardiomyopathy, COPD, Aortic Valve                 Disease, Signs/Symptoms:Shortness of Breath, Dyspnea and Chest                 Pain; Risk Factors:Hypertension  and Dyslipidemia. HOCM. Hypoxia.  Sonographer:    Sheralyn Boatman RDCS Referring Phys: [id number] ANASTASSIA DOUTOVA  Sonographer Comments: Technically difficult study due to poor echo windows, suboptimal parasternal window, suboptimal subcostal window and patient is morbidly obese. Image acquisition challenging due to patient body habitus. Paient could no longer stand apical images. Patient short of breath. Extremely difficult imaging windows. Could not isolate aortic gradients. Study delayed to get patient back in bed, and for cardiology consult. EKG malfunction. IMPRESSIONS  1. There is a dynamic LV gradient observered. A Peak LVOT / AV gradient of 126 mmHg was measured. The echo tech was not able to isolate the AV gradient. . . Left  ventricular ejection fraction, by estimation, is 70 to 75%. The left ventricle has hyperdynamic function. The left ventricle has no regional wall motion abnormalities. Left ventricular diastolic parameters are consistent with Grade II diastolic dysfunction (pseudonormalization).  2. Right ventricular systolic function is normal. The right ventricular size is normal.  3. The mitral valve is normal in structure. Trivial mitral valve regurgitation. Moderate mitral annular calcification.  4. The aortic valve is normal in structure. Aortic valve regurgitation is not visualized.  5. Aortic dilatation noted. There is moderate dilatation of the ascending aorta, measuring 42 mm. FINDINGS  Left Ventricle: There is a dynamic LV gradient observered. A Peak LVOT / AV gradient of 126 mmHg was measured. The echo tech was not able to isolate the AV gradient. Left ventricular ejection fraction, by estimation, is 70 to 75%. The left ventricle has  hyperdynamic function. The left ventricle has no regional wall motion abnormalities. The left ventricular internal cavity size was normal in size. There is no left ventricular hypertrophy. Left ventricular diastolic parameters are consistent with Grade II diastolic dysfunction (pseudonormalization). Right Ventricle: The right ventricular size is normal. No increase in right ventricular wall thickness. Right ventricular systolic function is normal. Left Atrium: Left atrial size was normal in size. Right Atrium: Right atrial size was normal in size. Pericardium: There is no evidence of pericardial effusion. Mitral Valve: The mitral valve is normal in structure. Moderate mitral annular calcification. Trivial mitral valve regurgitation. MV peak gradient, 11.8 mmHg. The mean mitral valve gradient is 5.0 mmHg. Tricuspid Valve: The tricuspid valve is normal in structure. Tricuspid valve regurgitation is trivial. Aortic Valve: The aortic valve is normal in structure. Aortic valve regurgitation is  not visualized. Pulmonic Valve: The pulmonic valve was normal in structure. Pulmonic valve regurgitation is not visualized. Aorta: Aortic dilatation noted. There is moderate dilatation of the ascending aorta, measuring 42 mm. IAS/Shunts: The atrial septum is grossly normal.  LEFT VENTRICLE PLAX 2D LVIDd:         4.20 cm      Diastology LVIDs:         1.70 cm      LV e' medial:    5.66 cm/s LV PW:         2.90 cm      LV E/e' medial:  31.3 LV IVS:        2.40 cm      LV e' lateral:   9.90 cm/s LVOT diam:     1.70 cm      LV E/e' lateral: 17.9 LVOT Area:     2.27 cm  LV Volumes (MOD) LV vol d, MOD A2C: 119.0 ml LV vol d, MOD A4C: 111.0 ml LV vol s, MOD A2C: 27.1 ml LV vol s, MOD A4C: 46.4 ml LV SV MOD A2C:  91.9 ml LV SV MOD A4C:     111.0 ml LV SV MOD BP:      87.5 ml RIGHT VENTRICLE             IVC RV S prime:     13.70 cm/s  IVC diam: 2.50 cm TAPSE (M-mode): 2.2 cm LEFT ATRIUM             Index       RIGHT ATRIUM           Index LA diam:        4.70 cm 2.05 cm/m  RA Area:     12.30 cm LA Vol (A2C):   26.9 ml 11.76 ml/m RA Volume:   24.80 ml  10.84 ml/m LA Vol (A4C):   57.0 ml 24.92 ml/m LA Biplane Vol: 39.2 ml 17.13 ml/m                        PULMONIC VALVE AORTA                 PR End Diast Vel: 2.58 msec Ao Root diam: 3.30 cm Ao Asc diam:  4.20 cm MITRAL VALVE MV Area (PHT): 4.49 cm     SHUNTS MV Peak grad:  11.8 mmHg    Systemic Diam: 1.70 cm MV Mean grad:  5.0 mmHg MV Vmax:       1.72 m/s MV Vmean:      90.0 cm/s MV Decel Time: 169 msec MV E velocity: 177.00 cm/s MV A velocity: 138.00 cm/s MV E/A ratio:  1.28 Kristeen Miss MD Electronically signed by Kristeen Miss MD Signature Date/Time: 03/09/2020/3:26:27 PM    Final     Cardiac Studies   Echo 07/16/19: 1. Severe intracavitary gradient. Peak velocity 4.67 m/s. Peak gradient  87.2 mmHg. Systolic anterior motion of the mitral valve. Findings  consistent with HOCM. Left ventricular ejection fraction, by estimation,  is 60 to 65%. The left  ventricle has  normal function. The left ventricle has no regional wall motion  abnormalities. There is severe concentric left ventricular hypertrophy.  Left ventricular diastolic parameters are consistent with Grade II  diastolic dysfunction (pseudonormalization).  2. Right ventricular systolic function is normal. The right ventricular  size is normal.  3. Left atrial size was mildly dilated.  4. The mitral valve is normal in structure. No evidence of mitral valve  regurgitation. No evidence of mitral stenosis.  5. The aortic valve is normal in structure. Aortic valve regurgitation is  not visualized. No aortic valve stenosis.  6. Aortic dilatation noted. There is mild dilatation of the ascending  aorta measuring 38 mm.  7. The inferior vena cava is dilated in size with >50% respiratory  variability, suggesting right atrial pressure of 8 mmHg.   Echo 09/09/20: 1. There is a dynamic LV gradient observered. A Peak LVOT / AV gradient  of 126 mmHg was measured. The echo tech was not able to isolate the AV  gradient. . . Left ventricular ejection fraction, by estimation, is 70 to  75%. The left ventricle has  hyperdynamic function. The left ventricle has no regional wall motion  abnormalities. Left ventricular diastolic parameters are consistent with  Grade II diastolic dysfunction (pseudonormalization).  2. Right ventricular systolic function is normal. The right ventricular  size is normal.  3. The mitral valve is normal in structure. Trivial mitral valve  regurgitation. Moderate mitral annular calcification.  4. The aortic valve is normal in  structure. Aortic valve regurgitation is  not visualized.  5. Aortic dilatation noted. There is moderate dilatation of the ascending  aorta, measuring 42 mm. 1. There is a dynamic LV gradient observered. A Peak LVOT / AV gradient  of 126 mmHg was measured. The echo tech was not able to isolate the AV  gradient. . . Left ventricular  ejection fraction, by estimation, is 70 to  75%. The left ventricle has  hyperdynamic function. The left ventricle has no regional wall motion  abnormalities. Left ventricular diastolic parameters are consistent with  Grade II diastolic dysfunction (pseudonormalization).  2. Right ventricular systolic function is normal. The right ventricular  size is normal.  3. The mitral valve is normal in structure. Trivial mitral valve  regurgitation. Moderate mitral annular calcification.  4. The aortic valve is normal in structure. Aortic valve regurgitation is  not visualized.  5. Aortic dilatation noted. There is moderate dilatation of the ascending  aorta, measuring 42 mm.   Patient Profile     70 y.o. 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  Assessment & Plan    # Acute on chronic diastolic heart failure: # HOCM:  Volume status is improving.  She is net -1.7 L yesterday.  She remains volume overloaded.  Echo shows severe concentric hypertrophy with septal wall per 2.4 cm and posterior wall 2.9 cm.  She has near cavity obliteration.  Her intracavitary gradient is quite high.  The peak velocity is 5.6 m/s and the peak gradient is 126 mmHg.  We did increase her metoprolol from 75 mg to 100 mg.  However I suspect this will not be enough to alleviate her symptoms.  We will also start disopyramide 200 mg twice daily.  She would be a good candidate for Mevacamten at [facility].  We will have her see Dr. Regino Schultze after discharge.  She does have a history of NSVT.  She denies any family history of sudden cardiac death.  She is unable to undergo cardiac MRI due to claustrophobia.  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  Her atria  are not particularly enlarged.  She has  grade 2 diastolic dysfunction rather than a restrictive filling pattern.  Would be reasonable to consider PYP scan.  No plan for ICD given her age.  # Chest pain: # Demand ischemia: High-sensitivity troponin was elevated but flat ranging from 30-36.  Findings not consistent with ACS.  More consistent with demand ischemia.  # OSA:  Continue bipap use.   # HTN: BP stable on metoprolol and losartan.   # Hyperlipidemia;  Continue rosuvastatin.  # NSVT:  None in the last 24 hours.  Continue metoprolol.  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  Reassess in 6 months.  BP control as above.   For questions or updates, please contact [facility] Please consult [email or web address] for contact info under        Signed, Chilton Si, MD  03/10/2020, 11:08 AM

## 2020-03-10 NOTE — Plan of Care (Signed)

## 2020-03-10 NOTE — Plan of Care (Signed)
  Problem: Education: Goal: Knowledge of General Education information will improve Description: Including pain rating scale, medication(s)/side effects and non-pharmacologic comfort measures Outcome: Progressing   Problem: Clinical Measurements: Goal: Ability to maintain clinical measurements within normal limits will improve Outcome: Progressing Goal: Will remain free from infection Outcome: Progressing Goal: Diagnostic test results will improve Outcome: Progressing Goal: Respiratory complications will improve Outcome: Progressing Goal: Cardiovascular complication will be avoided Outcome: Progressing   Problem: Coping: Goal: Level of anxiety will decrease Outcome: Progressing   Problem: Nutrition: Goal: Adequate nutrition will be maintained Outcome: Progressing   Problem: Elimination: Goal: Will not experience complications related to bowel motility Outcome: Progressing Goal: Will not experience complications related to urinary retention Outcome: Progressing   Problem: Pain Managment: Goal: General experience of comfort will improve Outcome: Progressing   Problem: Safety: Goal: Ability to remain free from injury will improve Outcome: Progressing

## 2020-03-10 NOTE — Progress Notes (Signed)
PROGRESS NOTE  Morgan Fischer  DOB: Sep 09, 1950  PCP: Jackie Plum, MD FYB:[id number]  DOA: 03/08/2020  LOS: 2 days   Chief Complaint  Patient presents with  . Shortness of Breath   Brief narrative: Morgan Fischer is a 70 y.o. female with PMH significant for morbid obesity, OSA on nightly BiPAP, HTN, HLD, HOCM, chronic diastolic CHF, COPD on 2 L home oxygen. Patient presented to the ED on 3/7 for evaluation of shortness of breath, hypoxia, worsening leg swelling. In the ED, patient was afebrile, hemodynamically stable Labs with potassium of 3.4 Blood gas with PCO2 elevated to 77 with normal pH at 7.34.  Troponin mildly elevated Chest x-ray showed stable cardiomegaly with pulmonary arterial hypertension. Patient was admitted to hospital service.   Cardiology consultation was obtained.  Subjective: Patient was seen and examined this morning. Pleasant elderly African-American female. Sitting up in chair.  Not in distress.  Breathing improving.  Ongoing diuresis.   Assessment/Plan: Acute on chronic respiratory failure with hypoxia and hypercapnia -Multifactorial -underlying CHF, COPD, OSA -Initial blood gas with PCO2 elevated to 77 with normal pH. -Continue nightly BiPAP and 2 L oxygen during the day -Continue bronchodilators.  Acute on chronic diastolic CHF Essential hypertension -Recent echo with EF 60 to 65% and confirmation of HOCM -Per cardiology note, patient did not wish further work-up for HOCM -Home meds include Toprol 75 mg daily, Lasix 80 mg twice daily amlodipine 5 mg daily, losartan 100 mg daily, -Currently on IV Lasix 60 mg twice daily.  Cardiology consult appreciated.  Metoprolol dose increased.   Continue losartan 100 mg daily.  Amlodipine on hold. -Net IO Since Admission: -3,350 mL [03/10/20 1440]  -Continue to monitor for daily intake output, weight, blood pressure, BNP, renal function and electrolytes. Recent Labs  Lab 03/08/20 1214 03/08/20 1215  03/08/20 1233 03/08/20 2245 03/09/20 0222 03/10/20 0351  BNP 450.8*  --   --   --   --   --   BUN  --  32*  --  29* 31* 36*  CREATININE  --  0.86  --  0.90 0.94 1.13*  K  --  3.4* 3.4* 4.0 3.9 3.8  MG  --   --   --  2.2 2.0 2.3   Severe HOCM -Per cardiology, patient has been started on disopyramide 200 mg twice daily.  She would be a good candidate [facility] at [facility].  To follow-up with Dr. Regino Schultze post discharge.  CAD Hyperlipidemia -Continue Crestor  Type 2 diabetes mellitus -A1c 8.9 on 3/7 -Home meds include Lantus 15 units at bedtime, glimepiride 4 mg twice daily, Metformin 500 mg twice daily. -Currently continued on Lantus 10 units.  Oral meds on hold. -Currently on Lantus 15 units at bedtime and glimepiride.  Metformin on hold.  Stop prednisone.  Start on premed scheduled insulin 3 units NovoLog 3 times daily.  Continue sliding scale insulin.  Recent Labs  Lab 03/09/20 1956 03/09/20 2354 03/10/20 0438 03/10/20 0756 03/10/20 1206  GLUCAP 368* 344* 207* 118* 147*   Large intra-abdominal wall panniculus -With skin thickening, dragging sensation.  No other alarming symptoms.  I reviewed the CT scan she had in August 2017 which showed skin changes and reticulations.  No fluid collection at the time.  Not suspicious for any thing new this admission. -Patient states that her primary care provider was hesitant of recommending any intervention because of severe HOCM.   Morbid obesity  -Body mass index is 51.09 kg/m. Patient has been advised to  make an attempt to improve diet and exercise patterns to aid in weight loss.  Gout  -continue allopurinol stable  Mobility: Encourage ambulation.  Ambulates at home with a cane.  Home health PT recommended by physical therapy Code Status:   Code Status: Full Code  Nutritional status: Body mass index is 51.7 kg/m.     Diet Order            Diet Carb Modified Fluid consistency: Thin; Room service appropriate? Yes  Diet effective  now                 DVT prophylaxis: enoxaparin (LOVENOX) injection 40 mg Start: 03/08/20 2245   Antimicrobials:  None Fluid: None Consultants: Cardiology Family Communication:  None at bedside  Status is: Inpatient   Remains inpatient appropriate because -continues to require diuresis  Dispo: The patient is from: Home              Anticipated d/c is to: Home in 1 to 2 days              Patient currently is not medically stable to d/c.   Difficult to place patient No   Infusions:  . sodium chloride      Scheduled Meds: . allopurinol  300 mg Oral Daily  . aspirin EC  81 mg Oral Daily  . disopyramide  100 mg Oral Q6H  . enoxaparin (LOVENOX) injection  40 mg Subcutaneous Q24H  . fluticasone furoate-vilanterol  1 puff Inhalation Daily  . furosemide  60 mg Intravenous Q12H  . glimepiride  4 mg Oral BID WC  . insulin aspart  0-15 Units Subcutaneous Q4H  . insulin aspart  3 Units Subcutaneous TID WC  . insulin glargine  15 Units Subcutaneous QHS  . ipratropium-albuterol  3 mL Inhalation Q6H  . losartan  100 mg Oral Daily  . metoprolol succinate  100 mg Oral Daily  . montelukast  10 mg Oral QHS  . rosuvastatin  40 mg Oral QHS  . sodium chloride flush  3 mL Intravenous Q12H    Antimicrobials: Anti-infectives (From admission, onward)   None      PRN meds: sodium chloride, acetaminophen **OR** acetaminophen, albuterol, albuterol, HYDROcodone-acetaminophen, lip balm, phenol, polyethylene glycol, sodium chloride flush   Objective: Vitals:   03/10/20 0757 03/10/20 1233  BP:  (!) 142/55  Pulse:  67  Resp:  18  Temp:  98.4 F (36.9 C)  SpO2: 99% 100%    Intake/Output Summary (Last 24 hours) at 03/10/2020 1440 Last data filed at 03/10/2020 0600 Gross per 24 hour  Intake 240 ml  Output 1700 ml  Net -1460 ml   Filed Weights   03/08/20 1030 03/09/20 0429 03/10/20 0444  Weight: 132.9 kg 133.3 kg 134.5 kg   Weight change: 1.596 kg Body mass index is 51.7 kg/m.    Physical Exam: General exam: Pleasant elderly morbidly obese African-American female. Skin: No rashes, lesions or ulcers. HEENT: Atraumatic, normocephalic, no obvious bleeding Lungs: Clear to auscultation bilaterally CVS: Regular rate and rhythm, no murmur GI/Abd soft, nontender, nondistended, bowel sound present.  Large intra-abdominal wall panniculus with superficial skin changes. CNS: Alert, awake, oriented x3 Psychiatry: Mood appropriate Extremities: Pedal edema trace bilaterally  Data Review: I have personally reviewed the laboratory data and studies available.  Recent Labs  Lab 03/08/20 1215 03/08/20 1233 03/09/20 0222 03/10/20 0351  WBC 8.4  --  7.9 11.2*  NEUTROABS 6.1  --  7.3 9.0*  HGB 10.8* 11.9* 10.6*  10.4*  HCT 36.1 35.0* 36.1 35.4*  MCV 91.6  --  92.8 92.2  PLT 138*  --  135* 156   Recent Labs  Lab 03/08/20 1215 03/08/20 1233 03/08/20 2245 03/09/20 0222 03/10/20 0351  NA 144 145 143 142 140  K 3.4* 3.4* 4.0 3.9 3.8  CL 96*  --  96* 97* 95*  CO2 36*  --  34* 31 31  GLUCOSE 165*  --  282* 409* 261*  BUN 32*  --  29* 31* 36*  CREATININE 0.86  --  0.90 0.94 1.13*  CALCIUM 9.4  --  9.5 9.1 9.1  MG  --   --  2.2 2.0 2.3  PHOS  --   --   --  2.8 3.1    F/u labs ordered Unresulted Labs (From admission, onward)          Start     Ordered   03/08/20 2148  Expectorated sputum assessment w rflx to resp cult  (COPD / Pneumonia )  Once,   R        03/08/20 2151   Unscheduled  Occult blood card to lab, stool  As needed,   R      03/08/20 2152          Signed, Lorin Glass, MD [facility] 03/10/2020

## 2020-03-10 NOTE — Progress Notes (Signed)
NUTRITION NOTE RD working remotely.   Consult received for assessment of nutrition requirements and status.   Patient admitted for COPD exacerbation and acute on chronic CHF. She has hx of type 2 DM and HgbA1c on 3/7 was 8.9%.   Will place DM diet handout in discharge instructions. Will place order for outpatient DM diet counseling.   She is currently on Carb Modified diet and consuming 100% at all meals since admission.   BMI of 51.7 indicates morbid obesity. MD note from 3/7 indicates discussion about encouraging patient to improve diet and exercise patterns to aid in weight loss.  No additional nutrition-related needs identified at this time. If additional nutrition-related needs arise, please re-consult RD.      Trenton Gammon, MS, RD, LDN, CNSC Inpatient Clinical Dietitian RD pager # available in AMION  After hours/weekend pager # available in [facility]

## 2020-03-10 NOTE — Progress Notes (Addendum)
Inpatient Diabetes Program Recommendations  AACE/ADA: New Consensus Statement on Inpatient Glycemic Control (2015)  Target Ranges:  Prepandial:   less than 140 mg/dL      Peak postprandial:   less than 180 mg/dL (1-2 hours)      Critically ill patients:  140 - 180 mg/dL   Lab Results  Component Value Date   GLUCAP 118 (H) 03/10/2020   HGBA1C 8.9 (H) 03/08/2020    Review of Glycemic Control Results for KAIRY, FOLSOM (MRN [id number]) as of 03/10/2020 09:35  Ref. Range 03/09/2020 04:09 03/09/2020 08:01 03/09/2020 12:19 03/09/2020 16:18 03/09/2020 19:56 03/09/2020 23:54 03/10/2020 04:38 03/10/2020 07:56  Glucose-Capillary Latest Ref Range: 70 - 99 mg/dL 353 (H) 614 (H) 431 (H) 401 (H) 368 (H) 344 (H) 207 (H) 118 (H)   Diabetes history: DM2 Outpatient Diabetes medications: Lantus 15 units + Amaryl 4 mg bid + Metformin 500 mg bid Current orders for Inpatient glycemic control: Lantus 15 units + Novolog 0-15 units q 4 hrs + Amaryl 4 mg qd  Inpatient Diabetes Program Recommendations:   While on steroids: -Novolog 5 units tid meal coverage if eats 50% Secure chat sent to Dr. Pola Corn. Dr. Pola Corn discontinuing steroids, so adding Novolog 3 units meal coverage tid and taper as effects of steroids decrease.  Thank you, Billy Fischer. Waris Rodger, RN, MSN, CDE  Diabetes Coordinator Inpatient Glycemic Control Team Team Pager [phone] (8am-5pm) 03/10/2020 9:36 AM

## 2020-03-11 DIAGNOSIS — N182 Chronic kidney disease, stage 2 (mild): Secondary | ICD-10-CM

## 2020-03-11 LAB — BASIC METABOLIC PANEL
Anion gap: 11 (ref 5–15)
BUN: 41 mg/dL — ABNORMAL HIGH (ref 8–23)
CO2: 33 mmol/L — ABNORMAL HIGH (ref 22–32)
Calcium: 8.7 mg/dL — ABNORMAL LOW (ref 8.9–10.3)
Chloride: 94 mmol/L — ABNORMAL LOW (ref 98–111)
Creatinine, Ser: 1.14 mg/dL — ABNORMAL HIGH (ref 0.44–1.00)
GFR, Estimated: 51 mL/min — ABNORMAL LOW (ref >60)
Glucose, Bld: 213 mg/dL — ABNORMAL HIGH (ref 70–99)
Potassium: 3.7 mmol/L (ref 3.5–5.1)
Sodium: 138 mmol/L (ref 135–145)

## 2020-03-11 LAB — CBC WITH DIFFERENTIAL/PLATELET
Abs Immature Granulocytes: 0.02 10*3/uL (ref 0.00–0.07)
Basophils Absolute: 0 10*3/uL (ref 0.0–0.1)
Basophils Relative: 0 %
Eosinophils Absolute: 0.2 10*3/uL (ref 0.0–0.5)
Eosinophils Relative: 2 %
HCT: 36.3 % (ref 36.0–46.0)
Hemoglobin: 10.8 g/dL — ABNORMAL LOW (ref 12.0–15.0)
Immature Granulocytes: 0 %
Lymphocytes Relative: 37 %
Lymphs Abs: 2.9 10*3/uL (ref 0.7–4.0)
MCH: 27.3 pg (ref 26.0–34.0)
MCHC: 29.8 g/dL — ABNORMAL LOW (ref 30.0–36.0)
MCV: 91.9 fL (ref 80.0–100.0)
Monocytes Absolute: 0.6 10*3/uL (ref 0.1–1.0)
Monocytes Relative: 8 %
Neutro Abs: 4.2 10*3/uL (ref 1.7–7.7)
Neutrophils Relative %: 53 %
Platelets: 150 10*3/uL (ref 150–400)
RBC: 3.95 MIL/uL (ref 3.87–5.11)
RDW: 15.9 % — ABNORMAL HIGH (ref 11.5–15.5)
WBC: 8 10*3/uL (ref 4.0–10.5)
nRBC: 0 % (ref 0.0–0.2)

## 2020-03-11 LAB — GLUCOSE, CAPILLARY
Glucose-Capillary: 109 mg/dL — ABNORMAL HIGH (ref 70–99)
Glucose-Capillary: 111 mg/dL — ABNORMAL HIGH (ref 70–99)
Glucose-Capillary: 190 mg/dL — ABNORMAL HIGH (ref 70–99)
Glucose-Capillary: 263 mg/dL — ABNORMAL HIGH (ref 70–99)
Glucose-Capillary: 229 mg/dL — ABNORMAL HIGH (ref 70–99)

## 2020-03-11 MED ORDER — FUROSEMIDE 40 MG PO TABS
80.0000 mg | ORAL_TABLET | Freq: Two times a day (BID) | ORAL | Status: DC
  Administered 2020-03-12: 80 mg via ORAL
  Filled 2020-03-11: qty 2

## 2020-03-11 NOTE — Progress Notes (Signed)
Pt place on BIPAP for hs use pt tolerating well

## 2020-03-11 NOTE — Care Management Important Message (Signed)
Important Message  Patient Details IM Letter given to the Patient. Name: Morgan Fischer MRN: [id number] Date of Birth: [birth year]   Medicare Important Message Given:  Yes     Caren Macadam 03/11/2020, 11:01 AM

## 2020-03-11 NOTE — Plan of Care (Signed)
  Problem: Education: Goal: Knowledge of General Education information will improve Description: Including pain rating scale, medication(s)/side effects and non-pharmacologic comfort measures Outcome: Progressing   Problem: Activity: Goal: Risk for activity intolerance will decrease Outcome: Progressing   Problem: Cardiac: Goal: Ability to achieve and maintain adequate cardiopulmonary perfusion will improve Outcome: Progressing

## 2020-03-11 NOTE — Progress Notes (Signed)
PROGRESS NOTE  Morgan Fischer  DOB: July 13, 1950  PCP: Jackie Plum, MD JOA:[id number]  DOA: 03/08/2020  LOS: 3 days   Chief Complaint  Patient presents with  . Shortness of Breath   Brief narrative: Morgan Fischer is a 70 y.o. female with PMH significant for morbid obesity, OSA on nightly BiPAP, HTN, HLD, HOCM, chronic diastolic CHF, COPD on 2 L home oxygen. Patient presented to the ED on 3/7 for evaluation of shortness of breath, hypoxia, worsening leg swelling. In the ED, patient was afebrile, hemodynamically stable Labs with potassium of 3.4 Blood gas with PCO2 elevated to 77 with normal pH at 7.34.  Troponin mildly elevated Chest x-ray showed stable cardiomegaly with pulmonary arterial hypertension. Patient was admitted to hospital service.   Cardiology consultation was obtained.  Subjective: Patient was seen and examined this morning. Sitting up in chair.  Not in distress.  No new symptoms.  Breathing better.  Edema improving.    Assessment/Plan: Acute on chronic respiratory failure with hypoxia and hypercapnia -Multifactorial -underlying CHF, COPD, OSA -Initial blood gas with PCO2 elevated to 77 with normal pH. -Continue nightly BiPAP and 2 L oxygen during the day -Continue bronchodilators.  Acute on chronic diastolic CHF Essential hypertension -Recent echo with EF 60 to 65% and confirmation of HOCM -Per cardiology note, patient did not wish further work-up for HOCM -Home meds include Toprol 75 mg daily, Lasix 80 mg twice daily amlodipine 5 mg daily, losartan 100 mg daily, -Currently on IV Lasix 60 mg twice daily.  Cardiology consult appreciated.  Metoprolol dose increased.   Continue losartan 100 mg daily.  Amlodipine on hold. -Net IO Since Admission: -4,550 mL [03/11/20 1031]  -Continue to monitor for daily intake output, weight, blood pressure, BNP, renal function and electrolytes. Recent Labs  Lab 03/08/20 1214 03/08/20 1215 03/08/20 1215 03/08/20 1233  03/08/20 2245 03/09/20 0222 03/10/20 0351 03/11/20 0941  BNP 450.8*  --   --   --   --   --   --   --   BUN  --  32*  --   --  29* 31* 36* 41*  CREATININE  --  0.86  --   --  0.90 0.94 1.13* 1.14*  K  --  3.4*   < > 3.4* 4.0 3.9 3.8 3.7  MG  --   --   --   --  2.2 2.0 2.3  --    < > = values in this interval not displayed.   Severe HOCM -Per cardiology, patient has been started on disopyramide 200 mg twice daily.  She would be a good candidate for [place] at [facility].  To follow-up with Dr. Regino Schultze post discharge.  CAD Hyperlipidemia -Continue Crestor  Type 2 diabetes mellitus -A1c 8.9 on 3/7 -Home meds include Lantus 15 units at bedtime, glimepiride 4 mg twice daily, Metformin 500 mg twice daily. -Currently continued on Lantus 10 units.  Oral meds on hold. -Currently on Lantus 15 units at bedtime and premeal NovoLog 3 units 3 times daily.  Continue glimepiride.  Metformin on hold. Continue sliding scale insulin.  Recent Labs  Lab 03/10/20 1617 03/10/20 2009 03/10/20 2353 03/11/20 0420 03/11/20 0736  GLUCAP 213* 221* 185* 109* 111*   Large intra-abdominal wall panniculus -With skin thickening, dragging sensation.  No other alarming symptoms.  I reviewed the CT scan she had in August 2017 which showed skin changes and reticulations.  No fluid collection at the time.  Not suspicious for any thing new this  admission. No active cellulitis, tenderness, ulceration or bleeding. -Patient states that her primary care provider was hesitant of recommending any intervention because of severe HOCM.   -As stated above, patient is to follow-up with cardiologist at [facility] for HOCM management.  Once her cardiac status improves, she can follow-up with general surgery as an outpatient for surgical management of the panniculus.  Morbid obesity  -Body mass index is 51.09 kg/m. Patient has been advised to make an attempt to improve diet and exercise patterns to aid in weight loss.  Gout  -continue  allopurinol stable  Mobility: Encourage ambulation.  Ambulates at home with a cane.  Home health PT recommended by physical therapy. Code Status:   Code Status: Full Code  Nutritional status: Body mass index is 52.12 kg/m.     Diet Order            Diet Carb Modified Fluid consistency: Thin; Room service appropriate? Yes  Diet effective now                 DVT prophylaxis: enoxaparin (LOVENOX) injection 40 mg Start: 03/08/20 2245   Antimicrobials:  None Fluid: None Consultants: Cardiology Family Communication:  None at bedside  Status is: Inpatient   Remains inpatient appropriate because -continues to require diuresis.  Pending cardiology clearance  Dispo: The patient is from: Home              Anticipated d/c is to: Home in 1 to 2 days              Patient currently is not medically stable to d/c.   Difficult to place patient No   Infusions:  . sodium chloride      Scheduled Meds: . allopurinol  300 mg Oral Daily  . aspirin EC  81 mg Oral Daily  . disopyramide  100 mg Oral Q6H  . enoxaparin (LOVENOX) injection  40 mg Subcutaneous Q24H  . fluticasone furoate-vilanterol  1 puff Inhalation Daily  . furosemide  60 mg Intravenous Q12H  . glimepiride  4 mg Oral BID WC  . insulin aspart  0-15 Units Subcutaneous Q4H  . insulin aspart  3 Units Subcutaneous TID WC  . insulin glargine  15 Units Subcutaneous QHS  . ipratropium-albuterol  3 mL Inhalation Q6H  . losartan  100 mg Oral Daily  . metoprolol succinate  100 mg Oral Daily  . montelukast  10 mg Oral QHS  . rosuvastatin  40 mg Oral QHS  . sodium chloride flush  3 mL Intravenous Q12H    Antimicrobials: Anti-infectives (From admission, onward)   None      PRN meds: sodium chloride, acetaminophen **OR** acetaminophen, albuterol, albuterol, HYDROcodone-acetaminophen, lip balm, phenol, polyethylene glycol, sodium chloride flush   Objective: Vitals:   03/11/20 0923 03/11/20 1020  BP:  (!) 129/59  Pulse:  63   Resp:  18  Temp:  98.2 F (36.8 C)  SpO2: 97% 98%    Intake/Output Summary (Last 24 hours) at 03/11/2020 1031 Last data filed at 03/11/2020 0800 Gross per 24 hour  Intake 450 ml  Output 1650 ml  Net -1200 ml   Filed Weights   03/09/20 0429 03/10/20 0444 03/11/20 0500  Weight: 133.3 kg 134.5 kg 135.6 kg   Weight change: 1.1 kg Body mass index is 52.12 kg/m.   Physical Exam: General exam: Pleasant elderly morbidly obese African-American female. Skin: No rashes, lesions or ulcers. HEENT: Atraumatic, normocephalic, no obvious bleeding Lungs: Clear to auscultation bilaterally CVS: Regular  rate and rhythm, no murmur GI/Abd soft, nontender, nondistended, bowel sound present.  Large intra-abdominal wall panniculus with superficial skin changes.  No active cellulitis, tenderness, ulceration or bleeding CNS: Alert, awake, oriented x3 Psychiatry: Mood appropriate Extremities: Pedal edema improving bilaterally  Data Review: I have personally reviewed the laboratory data and studies available.  Recent Labs  Lab 03/08/20 1215 03/08/20 1233 03/09/20 0222 03/10/20 0351 03/11/20 0941  WBC 8.4  --  7.9 11.2* 8.0  NEUTROABS 6.1  --  7.3 9.0* 4.2  HGB 10.8* 11.9* 10.6* 10.4* 10.8*  HCT 36.1 35.0* 36.1 35.4* 36.3  MCV 91.6  --  92.8 92.2 91.9  PLT 138*  --  135* 156 150   Recent Labs  Lab 03/08/20 1215 03/08/20 1233 03/08/20 2245 03/09/20 0222 03/10/20 0351 03/11/20 0941  NA 144 145 143 142 140 138  K 3.4* 3.4* 4.0 3.9 3.8 3.7  CL 96*  --  96* 97* 95* 94*  CO2 36*  --  34* 31 31 33*  GLUCOSE 165*  --  282* 409* 261* 213*  BUN 32*  --  29* 31* 36* 41*  CREATININE 0.86  --  0.90 0.94 1.13* 1.14*  CALCIUM 9.4  --  9.5 9.1 9.1 8.7*  MG  --   --  2.2 2.0 2.3  --   PHOS  --   --   --  2.8 3.1  --     F/u labs ordered Unresulted Labs (From admission, onward)          Start     Ordered   03/12/20 0500  CBC with Differential/Platelet  Daily,   R      03/11/20 0751    03/12/20 0500  Basic metabolic panel  Daily,   R      03/11/20 0751   03/08/20 2148  Expectorated sputum assessment w rflx to resp cult  (COPD / Pneumonia )  Once,   R        03/08/20 2151   Unscheduled  Occult blood card to lab, stool  As needed,   R      03/08/20 2152          Signed, Lorin Glass, MD [facility] 03/11/2020

## 2020-03-11 NOTE — Progress Notes (Signed)
Physical Therapy Treatment Patient Details Name: Morgan Fischer MRN: [id number] DOB: 1950/06/15 Today's Date: 03/11/2020    History of Present Illness Morgan Fischer is a 70 y.o. female with medical history significant of diastolic congestive heart failure as well as chronic respiratory dysfunction on 2 L with asthma/COPD.,  CKD, DM2, HTN, gout complete heart block, HLD, hypertrophic obstructive cardiomyopathy, morbid obesity, obstructive sleep apnea. Pt admitted for Acute on chronic respiratory failure with hypoxia and hypercapnia    PT Comments    Pt pleasant and ambulated short distance in hallway.  SPO2 95-100% on 3L O2 [place] and pt reports no dyspnea today with activity.  Pt anticipates d/c home soon.   Follow Up Recommendations  Home health PT     Equipment Recommendations  None recommended by PT    Recommendations for Other Services       Precautions / Restrictions Precautions Precautions: Fall Precaution Comments: 2-3L O2 [place] baseline Restrictions Weight Bearing Restrictions: No    Mobility  Bed Mobility               General bed mobility comments: Pt in recliner    Transfers Overall transfer level: Needs assistance Equipment used: Rolling walker (2 wheeled) Transfers: Sit to/from Stand Sit to Stand: Supervision Stand pivot transfers: Min guard       General transfer comment: cues for hand placement  Ambulation/Gait Ambulation/Gait assistance: Min guard Gait Distance (Feet): 80 Feet Assistive device: Rolling walker (2 wheeled) Gait Pattern/deviations: Step-through pattern;Decreased stride length Gait velocity: decr   General Gait Details: SpO2 95-100% on 3L O2 [place], distance per pt tolerance; pt steady with RW and encouraged to use upon d/c (chronic knee pain)   Stairs             Wheelchair Mobility    Modified Rankin (Stroke Patients Only)       Balance Overall balance assessment: Mild deficits observed, not formally tested                                           Cognition Arousal/Alertness: Awake/alert Behavior During Therapy: WFL for tasks assessed/performed Overall Cognitive Status: Within Functional Limits for tasks assessed                                        Exercises      General Comments        Pertinent Vitals/Pain Pain Assessment: No/denies pain    Home Living                      Prior Function            PT Goals (current goals can now be found in the care plan section) Acute Rehab PT Goals Patient Stated Goal: Bring O2 needs back down to 2L with activity. Progress towards PT goals: Progressing toward goals    Frequency    Min 3X/week      PT Plan Current plan remains appropriate    Co-evaluation              AM-PAC PT "6 Clicks" Mobility   Outcome Measure  Help needed turning from your back to your side while in a flat bed without using bedrails?: A Little Help needed moving from lying on your back to  sitting on the side of a flat bed without using bedrails?: A Little Help needed moving to and from a bed to a chair (including a wheelchair)?: A Little Help needed standing up from a chair using your arms (e.g., wheelchair or bedside chair)?: A Little Help needed to walk in hospital room?: A Little Help needed climbing 3-5 steps with a railing? : A Lot 6 Click Score: 17    End of Session Equipment Utilized During Treatment: Oxygen Activity Tolerance: Patient tolerated treatment well Patient left: in chair;with call bell/phone within reach   PT Visit Diagnosis: Difficulty in walking, not elsewhere classified (R26.2)     Time: 9826-4158 PT Time Calculation (min) (ACUTE ONLY): 10 min  Charges:  $Gait Training: 8-22 mins                    Paulino Door, DPT Acute Rehabilitation Services Pager: [phone] Office: [phone]    Maida Sale E 03/11/2020, 2:33 PM

## 2020-03-11 NOTE — Progress Notes (Signed)
Occupational Therapy Treatment Patient Details Name: Morgan Fischer MRN: [id number] DOB: 21-Sep-1950 Today's Date: 03/11/2020    History of present illness Morgan Fischer is a 70 y.o. female with medical history significant of diastolic congestive heart failure as well as chronic respiratory dysfunction on 2 L with asthma/COPD.,  CKD, DM2, HTN, gout complete heart block, HLD, hypertrophic obstructive cardiomyopathy, morbid obesity, obstructive sleep apnea. Pt admitted for Acute on chronic respiratory failure with hypoxia and hypercapnia   OT comments  Patient found on 2 L [place] sitting in recliner. Patient ambulated to bathroom with RW on 2 L and performed grooming task at sink in seated position. Reports this is how she performs task at home. Patient returned to recliner after task and o2 sat 94-95% on 2 L. Patient instructed on energy conservation concepts as well as encouraged to perform more activity while monitoring energy level and shortness of breath. Patient verbalized understanding of all education. Discussed HH OT services at discharge - to assess home environment, recommend further compensatory strategies for home tasks and work on improving activity tolerance.    Follow Up Recommendations  Home health OT;Supervision - Intermittent    Equipment Recommendations  Toilet rise with handles    Recommendations for Other Services      Precautions / Restrictions Precautions Precautions: Fall Precaution Comments: 2-3L O2 [place] baseline Restrictions Weight Bearing Restrictions: No       Mobility Bed Mobility               General bed mobility comments: Pt in recliner    Transfers Overall transfer level: Needs assistance Equipment used: Rolling walker (2 wheeled) Transfers: Sit to/from [employer] Sit to Stand: Supervision Stand pivot transfers: Min guard       General transfer comment: ambulated to bathroom with RW.    Balance Overall balance assessment: Mild  deficits observed, not formally tested                                         ADL either performed or assessed with clinical judgement   ADL       Grooming: Wash/dry hands;Wash/dry face;Oral care Grooming Details (indicate cue type and reason): sat in chair in front of sink to perform grooming. Patient reports sitting to perform ADL tasks at home.                                     Vision   Vision Assessment?: No apparent visual deficits   Perception     Praxis      Cognition Arousal/Alertness: Awake/alert Behavior During Therapy: WFL for tasks assessed/performed Overall Cognitive Status: Within Functional Limits for tasks assessed                                          Exercises     Shoulder Instructions       General Comments      Pertinent Vitals/ Pain       Pain Assessment: No/denies pain  Home Living  Prior Functioning/Environment              Frequency  Min 2X/week        Progress Toward Goals  OT Goals(current goals can now be found in the care plan section)  Progress towards OT goals: Progressing toward goals  Acute Rehab OT Goals Patient Stated Goal: Bring O2 needs back down to 2L with activity. OT Goal Formulation: With patient Time For Goal Achievement: 03/23/20 Potential to Achieve Goals: Good  Plan Discharge plan remains appropriate    Co-evaluation                 AM-PAC OT "6 Clicks" Daily Activity     Outcome Measure   Help from another person eating meals?: None Help from another person taking care of personal grooming?: A Little Help from another person toileting, which includes using toliet, bedpan, or urinal?: A Little Help from another person bathing (including washing, rinsing, drying)?: A Little Help from another person to put on and taking off regular upper body clothing?: A Little Help from another  person to put on and taking off regular lower body clothing?: A Lot 6 Click Score: 18    End of Session Equipment Utilized During Treatment: Oxygen;Rolling walker  OT Visit Diagnosis: Unsteadiness on feet (R26.81)   Activity Tolerance Patient tolerated treatment well   Patient Left in chair;with call bell/phone within reach   Nurse Communication Mobility status        Time: 1191-4782 OT Time Calculation (min): 24 min  Charges: OT General Charges $OT Visit: 1 Visit OT Treatments $Self Care/Home Management : 23-37 mins  [name], OTR/L Acute Care Rehab Services  Office [phone] Pager: [phone]    Kelli Churn 03/11/2020, 11:12 AM

## 2020-03-11 NOTE — Progress Notes (Signed)
Progress Note  Patient Name: Morgan Fischer Date of Encounter: 03/11/2020  [facility] Cardiologist: Garwin Brothers, MD   Subjective   Feeling well. Breathing is improving. She was happy that she could breath laying in bed without her BIPAP or oxygen.  Notes that her daughter was recently diagnosed with HCM.  Inpatient Medications    Scheduled Meds: . allopurinol  300 mg Oral Daily  . aspirin EC  81 mg Oral Daily  . disopyramide  100 mg Oral Q6H  . enoxaparin (LOVENOX) injection  40 mg Subcutaneous Q24H  . fluticasone furoate-vilanterol  1 puff Inhalation Daily  . [START ON 03/12/2020] furosemide  80 mg Oral BID  . glimepiride  4 mg Oral BID WC  . insulin aspart  0-15 Units Subcutaneous Q4H  . insulin aspart  3 Units Subcutaneous TID WC  . insulin glargine  15 Units Subcutaneous QHS  . ipratropium-albuterol  3 mL Inhalation Q6H  . losartan  100 mg Oral Daily  . metoprolol succinate  100 mg Oral Daily  . montelukast  10 mg Oral QHS  . rosuvastatin  40 mg Oral QHS  . sodium chloride flush  3 mL Intravenous Q12H   Continuous Infusions: . sodium chloride     PRN Meds: sodium chloride, acetaminophen **OR** acetaminophen, albuterol, albuterol, HYDROcodone-acetaminophen, lip balm, phenol, polyethylene glycol, sodium chloride flush   Vital Signs    Vitals:   03/11/20 0423 03/11/20 0500 03/11/20 0923 03/11/20 1020  BP: (!) 110/44   (!) 129/59  Pulse: (!) 53   63  Resp: 19   18  Temp: 97.6 F (36.4 C)   98.2 F (36.8 C)  TempSrc: Oral   Oral  SpO2: 98%  97% 98%  Weight:  135.6 kg    Height:        Intake/Output Summary (Last 24 hours) at 03/11/2020 1312 Last data filed at 03/11/2020 0800 Gross per 24 hour  Intake 690 ml  Output 1650 ml  Net -960 ml   Last 3 Weights 03/11/2020 03/10/2020 03/09/2020  Weight (lbs) 298 lb 15.1 oz 296 lb 8.3 oz 293 lb 14 oz  Weight (kg) 135.6 kg 134.5 kg 133.3 kg      Telemetry    Sinus rhythm.  PVC  No events.- Personally  Reviewed  ECG    n/a - Personally Reviewed  Physical Exam   VS:  BP (!) 129/59 (BP Location: Right Arm)   Pulse 63   Temp 98.2 F (36.8 C) (Oral)   Resp 18   Ht 5' 3.5" (1.613 m)   Wt 135.6 kg   LMP  (LMP Unknown)   SpO2 98%   BMI 52.12 kg/m  , BMI Body mass index is 52.12 kg/m. GENERAL:  Well appearing HEENT: Pupils equal round and reactive, fundi not visualized, oral mucosa unremarkable NECK:  No jugular venous distention, waveform within normal limits, carotid upstroke brisk and symmetric, no bruits LUNGS:  Clear to auscultation bilaterally HEART:  RRR.  PMI not displaced or sustained,S1 and S2 within normal limits, no S3, no S4, no clicks, no rubs, II/VI systolic murmur at the LLSB ABD:  Flat, positive bowel sounds normal in frequency in pitch, no bruits, no rebound, no guarding, no midline pulsatile mass, no hepatomegaly, no splenomegaly EXT:  2 plus pulses throughout, woody LE edema, no cyanosis no clubbing SKIN:  No rashes no nodules NEURO:  Cranial nerves II through XII grossly intact, motor grossly intact throughout PSYCH:  Cognitively intact, oriented to person  place and time   Labs    High Sensitivity Troponin:   Recent Labs  Lab 03/08/20 1214 03/08/20 2059 03/08/20 2245 03/09/20 0222  TROPONINIHS 27*  30* 22* 24* 17      Chemistry Recent Labs  Lab 03/08/20 1215 03/08/20 1233 03/09/20 0222 03/10/20 0351 03/11/20 0941  NA 144   < > 142 140 138  K 3.4*   < > 3.9 3.8 3.7  CL 96*   < > 97* 95* 94*  CO2 36*   < > 31 31 33*  GLUCOSE 165*   < > 409* 261* 213*  BUN 32*   < > 31* 36* 41*  CREATININE 0.86   < > 0.94 1.13* 1.14*  CALCIUM 9.4   < > 9.1 9.1 8.7*  PROT 7.4  --  7.0  --   --   ALBUMIN 4.3  --  4.0  --   --   AST 17  --  22  --   --   ALT 19  --  24  --   --   ALKPHOS 52  --  58  --   --   BILITOT 0.6  --  1.0  --   --   GFRNONAA >60   < > >60 52* 51*  ANIONGAP 12   < > 14 14 11    < > = values in this interval not displayed.      Hematology Recent Labs  Lab 03/09/20 0222 03/10/20 0351 03/11/20 0941  WBC 7.9 11.2* 8.0  RBC 3.89 3.84* 3.95  HGB 10.6* 10.4* 10.8*  HCT 36.1 35.4* 36.3  MCV 92.8 92.2 91.9  MCH 27.2 27.1 27.3  MCHC 29.4* 29.4* 29.8*  RDW 15.9* 15.9* 15.9*  PLT 135* 156 150    BNP Recent Labs  Lab 03/08/20 1214  BNP 450.8*     DDimer  Recent Labs  Lab 03/08/20 2245  DDIMER 0.54*     Radiology    ECHOCARDIOGRAM COMPLETE  Result Date: 03/09/2020    ECHOCARDIOGRAM REPORT   Patient Name:   MADIHA BAMBRICK Date of Exam: 03/09/2020 Medical Rec #:  05/09/2020    Height:       63.5 in Accession #:    [id number]   Weight:       293.0 lb Date of Birth:  09-30-1950    BSA:          2.288 m Patient Age:    69 years     BP:           145/70 mmHg Patient Gender: F            HR:           97 bpm. Exam Location:  Inpatient Procedure: 2D Echo, 3D Echo, Cardiac Doppler and Color Doppler Indications:    I50.40* Unspecified combined systolic (congestive) and diastolic                 (congestive) heart failure  History:        Patient has prior history of Echocardiogram examinations, most                 recent 07/16/2019. CHF and Cardiomyopathy, COPD, Aortic Valve                 Disease, Signs/Symptoms:Shortness of Breath, Dyspnea and Chest                 Pain; Risk Factors:Hypertension and Dyslipidemia. HOCM. Hypoxia.  Sonographer:    Sheralyn Boatman RDCS Referring Phys: [id number] ANASTASSIA DOUTOVA  Sonographer Comments: Technically difficult study due to poor echo windows, suboptimal parasternal window, suboptimal subcostal window and patient is morbidly obese. Image acquisition challenging due to patient body habitus. Paient could no longer stand apical images. Patient short of breath. Extremely difficult imaging windows. Could not isolate aortic gradients. Study delayed to get patient back in bed, and for cardiology consult. EKG malfunction. IMPRESSIONS  1. There is a dynamic LV gradient observered. A Peak LVOT / AV  gradient of 126 mmHg was measured. The echo tech was not able to isolate the AV gradient. . . Left ventricular ejection fraction, by estimation, is 70 to 75%. The left ventricle has hyperdynamic function. The left ventricle has no regional wall motion abnormalities. Left ventricular diastolic parameters are consistent with Grade II diastolic dysfunction (pseudonormalization).  2. Right ventricular systolic function is normal. The right ventricular size is normal.  3. The mitral valve is normal in structure. Trivial mitral valve regurgitation. Moderate mitral annular calcification.  4. The aortic valve is normal in structure. Aortic valve regurgitation is not visualized.  5. Aortic dilatation noted. There is moderate dilatation of the ascending aorta, measuring 42 mm. FINDINGS  Left Ventricle: There is a dynamic LV gradient observered. A Peak LVOT / AV gradient of 126 mmHg was measured. The echo tech was not able to isolate the AV gradient. Left ventricular ejection fraction, by estimation, is 70 to 75%. The left ventricle has  hyperdynamic function. The left ventricle has no regional wall motion abnormalities. The left ventricular internal cavity size was normal in size. There is no left ventricular hypertrophy. Left ventricular diastolic parameters are consistent with Grade II diastolic dysfunction (pseudonormalization). Right Ventricle: The right ventricular size is normal. No increase in right ventricular wall thickness. Right ventricular systolic function is normal. Left Atrium: Left atrial size was normal in size. Right Atrium: Right atrial size was normal in size. Pericardium: There is no evidence of pericardial effusion. Mitral Valve: The mitral valve is normal in structure. Moderate mitral annular calcification. Trivial mitral valve regurgitation. MV peak gradient, 11.8 mmHg. The mean mitral valve gradient is 5.0 mmHg. Tricuspid Valve: The tricuspid valve is normal in structure. Tricuspid valve regurgitation  is trivial. Aortic Valve: The aortic valve is normal in structure. Aortic valve regurgitation is not visualized. Pulmonic Valve: The pulmonic valve was normal in structure. Pulmonic valve regurgitation is not visualized. Aorta: Aortic dilatation noted. There is moderate dilatation of the ascending aorta, measuring 42 mm. IAS/Shunts: The atrial septum is grossly normal.  LEFT VENTRICLE PLAX 2D LVIDd:         4.20 cm      Diastology LVIDs:         1.70 cm      LV e' medial:    5.66 cm/s LV PW:         2.90 cm      LV E/e' medial:  31.3 LV IVS:        2.40 cm      LV e' lateral:   9.90 cm/s LVOT diam:     1.70 cm      LV E/e' lateral: 17.9 LVOT Area:     2.27 cm  LV Volumes (MOD) LV vol d, MOD A2C: 119.0 ml LV vol d, MOD A4C: 111.0 ml LV vol s, MOD A2C: 27.1 ml LV vol s, MOD A4C: 46.4 ml LV SV MOD A2C:     91.9 ml LV  SV MOD A4C:     111.0 ml LV SV MOD BP:      87.5 ml RIGHT VENTRICLE             IVC RV S prime:     13.70 cm/s  IVC diam: 2.50 cm TAPSE (M-mode): 2.2 cm LEFT ATRIUM             Index       RIGHT ATRIUM           Index LA diam:        4.70 cm 2.05 cm/m  RA Area:     12.30 cm LA Vol (A2C):   26.9 ml 11.76 ml/m RA Volume:   24.80 ml  10.84 ml/m LA Vol (A4C):   57.0 ml 24.92 ml/m LA Biplane Vol: 39.2 ml 17.13 ml/m                        PULMONIC VALVE AORTA                 PR End Diast Vel: 2.58 msec Ao Root diam: 3.30 cm Ao Asc diam:  4.20 cm MITRAL VALVE MV Area (PHT): 4.49 cm     SHUNTS MV Peak grad:  11.8 mmHg    Systemic Diam: 1.70 cm MV Mean grad:  5.0 mmHg MV Vmax:       1.72 m/s MV Vmean:      90.0 cm/s MV Decel Time: 169 msec MV E velocity: 177.00 cm/s MV A velocity: 138.00 cm/s MV E/A ratio:  1.28 Kristeen MissPhilip Nahser MD Electronically signed by Kristeen MissPhilip Nahser MD Signature Date/Time: 03/09/2020/3:26:27 PM    Final     Cardiac Studies   Echo 07/16/19: 1. Severe intracavitary gradient. Peak velocity 4.67 m/s. Peak gradient  87.2 mmHg. Systolic anterior motion of the mitral valve. Findings   consistent with HOCM. Left ventricular ejection fraction, by estimation,  is 60 to 65%. The left ventricle has  normal function. The left ventricle has no regional wall motion  abnormalities. There is severe concentric left ventricular hypertrophy.  Left ventricular diastolic parameters are consistent with Grade II  diastolic dysfunction (pseudonormalization).  2. Right ventricular systolic function is normal. The right ventricular  size is normal.  3. Left atrial size was mildly dilated.  4. The mitral valve is normal in structure. No evidence of mitral valve  regurgitation. No evidence of mitral stenosis.  5. The aortic valve is normal in structure. Aortic valve regurgitation is  not visualized. No aortic valve stenosis.  6. Aortic dilatation noted. There is mild dilatation of the ascending  aorta measuring 38 mm.  7. The inferior vena cava is dilated in size with >50% respiratory  variability, suggesting right atrial pressure of 8 mmHg.   Echo 09/09/20: 1. There is a dynamic LV gradient observered. A Peak LVOT / AV gradient  of 126 mmHg was measured. The echo tech was not able to isolate the AV  gradient. . . Left ventricular ejection fraction, by estimation, is 70 to  75%. The left ventricle has  hyperdynamic function. The left ventricle has no regional wall motion  abnormalities. Left ventricular diastolic parameters are consistent with  Grade II diastolic dysfunction (pseudonormalization).  2. Right ventricular systolic function is normal. The right ventricular  size is normal.  3. The mitral valve is normal in structure. Trivial mitral valve  regurgitation. Moderate mitral annular calcification.  4. The aortic valve is normal in structure. Aortic valve  regurgitation is  not visualized.  5. Aortic dilatation noted. There is moderate dilatation of the ascending  aorta, measuring 42 mm. 1. There is a dynamic LV gradient observered. A Peak LVOT / AV gradient  of 126  mmHg was measured. The echo tech was not able to isolate the AV  gradient. . . Left ventricular ejection fraction, by estimation, is 70 to  75%. The left ventricle has  hyperdynamic function. The left ventricle has no regional wall motion  abnormalities. Left ventricular diastolic parameters are consistent with  Grade II diastolic dysfunction (pseudonormalization).  2. Right ventricular systolic function is normal. The right ventricular  size is normal.  3. The mitral valve is normal in structure. Trivial mitral valve  regurgitation. Moderate mitral annular calcification.  4. The aortic valve is normal in structure. Aortic valve regurgitation is  not visualized.  5. Aortic dilatation noted. There is moderate dilatation of the ascending  aorta, measuring 42 mm.   Patient Profile     70 y.o. 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  Assessment & Plan    # Acute on chronic diastolic heart failure: # HOCM:  Volume status is improving.  She is net -1 L yesterday, -4.3L this admission.  She still has significant lower extremity edema.  However I think some of this is venous stasis.  Her breathing is much better and she is able to lay flat.  Renal function is worsening with diuresis.  Therefore we will stop IV Lasix.  Plan to resume 80 mg twice daily tomorrow.  We did discuss limiting her sodium to 1500 mg and fluids to 1.5 L.  She does note that she struggles with fluid intake at home due to thirst.  Echo shows severe concentric hypertrophy with septal wall per 2.4 cm and posterior wall 2.9 cm.  She has near cavity obliteration.  Her intracavitary gradient is quite high.  The peak velocity is 5.6 m/s and the peak gradient is 126 mmHg.  We did increase her metoprolol from 75 mg to 100 mg.  We also started disopyramide 200 mg twice daily.  She is feeling  better and her murmur seems less pronounced on exam.  She would be a good candidate for Mev[place]amten at [facility].  We will have her see Dr. Regino Schultze after discharge.  She does have a history of NSVT.  She denies any family history of sudden cardiac death.  She is unable to undergo cardiac MRI due to claustrophobia.  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  Her atria  are not particularly enlarged.  She has grade 2 diastolic dysfunction rather than a more restrictive filling pattern.  Would be reasonable to consider PYP scan as an outpatient.  No plan for ICD given her age.  # Chest pain: # Demand ischemia: High-sensitivity troponin was elevated but flat ranging from 30-36.  Findings not consistent with ACS.  More consistent with demand ischemia.  # OSA:  Continue bipap use.   # HTN: BP stable on metoprolol and losartan.   # Hyperlipidemia;  Continue rosuvastatin.  # NSVT:  None in the last 24 hours.  Continue metoprolol.  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  Reassess in 6 months.  BP control as above.   For questions or updates, please contact [facility]  Please consult [email or web address] for contact info under        Signed, Chilton Si, MD  03/11/2020, 1:12 PM

## 2020-03-12 LAB — CBC WITH DIFFERENTIAL/PLATELET
Abs Immature Granulocytes: 0.04 10*3/uL (ref 0.00–0.07)
Basophils Absolute: 0 10*3/uL (ref 0.0–0.1)
Basophils Relative: 0 %
Eosinophils Absolute: 0.2 10*3/uL (ref 0.0–0.5)
Eosinophils Relative: 2 %
HCT: 35.1 % — ABNORMAL LOW (ref 36.0–46.0)
Hemoglobin: 10.1 g/dL — ABNORMAL LOW (ref 12.0–15.0)
Immature Granulocytes: 1 %
Lymphocytes Relative: 35 %
Lymphs Abs: 3 10*3/uL (ref 0.7–4.0)
MCH: 26.9 pg (ref 26.0–34.0)
MCHC: 28.8 g/dL — ABNORMAL LOW (ref 30.0–36.0)
MCV: 93.4 fL (ref 80.0–100.0)
Monocytes Absolute: 0.9 10*3/uL (ref 0.1–1.0)
Monocytes Relative: 10 %
Neutro Abs: 4.5 10*3/uL (ref 1.7–7.7)
Neutrophils Relative %: 52 %
Platelets: 152 10*3/uL (ref 150–400)
RBC: 3.76 MIL/uL — ABNORMAL LOW (ref 3.87–5.11)
RDW: 16 % — ABNORMAL HIGH (ref 11.5–15.5)
WBC: 8.7 10*3/uL (ref 4.0–10.5)
nRBC: 0 % (ref 0.0–0.2)

## 2020-03-12 LAB — BASIC METABOLIC PANEL
Anion gap: 12 (ref 5–15)
BUN: 42 mg/dL — ABNORMAL HIGH (ref 8–23)
CO2: 31 mmol/L (ref 22–32)
Calcium: 8.8 mg/dL — ABNORMAL LOW (ref 8.9–10.3)
Chloride: 98 mmol/L (ref 98–111)
Creatinine, Ser: 1.33 mg/dL — ABNORMAL HIGH (ref 0.44–1.00)
GFR, Estimated: 43 mL/min — ABNORMAL LOW (ref >60)
Glucose, Bld: 112 mg/dL — ABNORMAL HIGH (ref 70–99)
Potassium: 3.6 mmol/L (ref 3.5–5.1)
Sodium: 141 mmol/L (ref 135–145)

## 2020-03-12 LAB — GLUCOSE, CAPILLARY
Glucose-Capillary: 108 mg/dL — ABNORMAL HIGH (ref 70–99)
Glucose-Capillary: 111 mg/dL — ABNORMAL HIGH (ref 70–99)
Glucose-Capillary: 143 mg/dL — ABNORMAL HIGH (ref 70–99)
Glucose-Capillary: 167 mg/dL — ABNORMAL HIGH (ref 70–99)

## 2020-03-12 MED ORDER — FUROSEMIDE 80 MG PO TABS: 80.0000 mg | ORAL_TABLET | Freq: Two times a day (BID) | ORAL | 2 refills | Status: DC

## 2020-03-12 MED ORDER — DISOPYRAMIDE PHOSPHATE ER 100 MG PO CP12: 200.0000 mg | ORAL_CAPSULE | Freq: Two times a day (BID) | ORAL | 2 refills | Status: DC

## 2020-03-12 MED ORDER — METOPROLOL SUCCINATE ER 100 MG PO TB24: 100.0000 mg | ORAL_TABLET | Freq: Every day | ORAL | 2 refills | Status: DC

## 2020-03-12 MED ORDER — DISOPYRAMIDE PHOSPHATE ER 100 MG PO CP12: 200.0000 mg | ORAL_CAPSULE | Freq: Two times a day (BID) | ORAL | Status: DC

## 2020-03-12 NOTE — Progress Notes (Signed)
Pt awake and sitting in side of bed with 2lpm cann back on pt.not resting well tonight  Treatment given

## 2020-03-12 NOTE — Discharge Summary (Signed)
PROGRESS NOTE  Morgan Fischer  DOB: 1950/11/09  PCP: Benito Mccreedy, MD EVO:[id number]  DOA: 03/08/2020  LOS: 4 days   Chief Complaint  Patient presents with  . Shortness of Breath   Brief narrative: Morgan Fischer is a 70 y.o. female with PMH significant for morbid obesity, OSA on nightly BiPAP, HTN, HLD, HOCM, chronic diastolic CHF, COPD on 2 L home oxygen. Patient presented to the ED on 3/7 for evaluation of shortness of breath, hypoxia, worsening leg swelling. In the ED, patient was afebrile, hemodynamically stable Labs with potassium of 3.4 Blood gas with PCO2 elevated to 77 with normal pH at 7.34.  Troponin mildly elevated Chest x-ray showed stable cardiomegaly with pulmonary arterial hypertension. Patient was admitted to hospital service.   Cardiology consultation was obtained.  Subjective: Patient was seen and examined this morning. Sitting up in chair.  Not in distress.  No new symptoms.  On 2 L oxygen by nasal cannula  Hospital course: Acute on chronic respiratory failure with hypoxia and hypercapnia -Multifactorial -underlying CHF, COPD, OSA -Initial blood gas with PCO2 elevated to 77 with normal pH. -Dyspnea improved.  Continue nightly BiPAP and 2 L oxygen during the day -Continue bronchodilators.  Acute on chronic diastolic CHF Essential hypertension -Recent echo with EF 60 to 65% and confirmation of HOCM. -Cardiology consult appreciated. -Adequately diuresed with IV Lasix 60 mg twice daily.  Creatinine slightly elevated at baseline due to IV diuresis.  Expect improvement with resumption of oral diuretics at home. -Metoprolol dose was increased, disopyramide was started.   -We will discharge the patient home on Toprol 100 mg daily, disopyramide 200 mg twice daily.  As before, continue Lasix 80 mg twice daily and losartan 100 mg daily.  -Net IO Since Admission: -4,500 mL [03/12/20 1135]  Recent Labs  Lab 03/08/20 1214 03/08/20 1215 03/08/20 2245 03/09/20 0222  03/10/20 0351 03/11/20 0941 03/12/20 0431  BNP 450.8*  --   --   --   --   --   --   BUN  --    < > 29* 31* 36* 41* 42*  CREATININE  --    < > 0.90 0.94 1.13* 1.14* 1.33*  K  --    < > 4.0 3.9 3.8 3.7 3.6  MG  --   --  2.2 2.0 2.3  --   --    < > = values in this interval not displayed.   Severe HOCM -Per cardiology, patient has been started on disopyramide 200 mg twice daily.  She would be a good candidate for [place] at [facility]: [facility] Rhonda W.  To follow-up with Dr. Mina Marble post discharge.  CAD Hyperlipidemia -Continue Crestor  Type 2 diabetes mellitus -A1c 8.9 on 3/7 -Home meds include Lantus 15 units at bedtime, glimepiride 4 mg twice daily, Metformin 500 mg twice daily. -Resume home meds at discharge.  On further blood work as an outpatient, if creatinine tends to worsen, Metformin has to be stopped. Recent Labs  Lab 03/11/20 1709 03/11/20 2001 03/12/20 0000 03/12/20 0432 03/12/20 0753  GLUCAP 263* 229* 143* 108* 111*   Large intra-abdominal wall panniculus -With skin thickening, dragging sensation.  No other alarming symptoms.  I reviewed the CT scan she had in August 2017 which showed skin changes and reticulations.  No fluid collection at the time.  Not suspicious for any thing new this admission. No active cellulitis, tenderness, ulceration or bleeding. -Patient states that her primary care provider was hesitant of recommending any intervention because of severe HOCM.   -  As stated above, patient is to follow-up with cardiologist at [facility] for HOCM management.  Once her cardiac status improves, she can follow-up with general surgery as an outpatient for surgical management of the panniculus.  Morbid obesity  -Body mass index is 51.09 kg/m. Patient has been advised to make an attempt to improve diet and exercise patterns to aid in weight loss.  Gout  -continue allopurinol stable   Wound care:    Discharge Exam:   Vitals:   03/11/20 2208 03/12/20 0118 03/12/20 0437 03/12/20 0902   BP:   (!) 141/57   Pulse:   (!) 59   Resp: 19  18   Temp:   97.7 F (36.5 C)   TempSrc:   Oral   SpO2:  98% 99% 100%  Weight:   135.5 kg   Height:        Body mass index is 52.09 kg/m.  General exam: Pleasant, elderly morbidly obese African-American female Skin: No rashes, lesions or ulcers. HEENT: Atraumatic, normocephalic, no obvious bleeding Lungs: Clear to auscultation bilaterally CVS: Regular rate and rhythm, no murmur GI/Abd soft, nontender, nondistended, bowel sound present, has a large anterior abdominal wall panniculus with superficial skin changes, no cellulitis, ulceration or bleeding CNS: Alert, awake, oriented x3 Psychiatry: Mood appropriate Extremities: Pedal edema trace bilateral significantly better than at presentation.  No calf tenderness  Follow ups:   Discharge Instructions    Amb Referral to Nutrition and Diabetic Education   Complete by: As directed    Diet - low sodium heart healthy   Complete by: As directed    Diet Carb Modified   Complete by: As directed    Increase activity slowly   Complete by: As directed       Follow-up Information    [facility] Follow up.   Specialty: [place] Why: agency will provide home health physical therapy Contact information: [address] [place] [address] [phone]        Jenean Lindau, MD Follow up on 03/15/2020.   Specialty: Cardiology Why: Please arrive 15 minutes early for your 11:20am post-hospital cardiology appointment Contact information: [address] [place]  [place] [address] [phone]        Dr. Derinda Sis Follow up on 04/01/2020.   Why: Please arrive 15 minutes early for your 8:40am for your post-hospital cardiology appointment to discuss treatment options for your HOCM Contact information: [address], [address] [place], [place]       Benito Mccreedy, MD Follow up.   Specialty: Internal  Medicine Contact information: [address] [place] [address] [phone]        Revankar, Reita Cliche, MD .   Specialty: Cardiology Contact information: [address] [place] [address] [phone]               Recommendations for Outpatient Follow-Up:   1. Follow-up with PCP as an outpatient 2. Follow-up with cardiology as an outpatient  Discharge Instructions:  Follow with Primary MD Benito Mccreedy, MD in 7 days   Get CBC/BMP checked in next visit within 1 week by PCP or SNF MD ( we routinely change or add medications that can affect your baseline labs and fluid status, therefore we recommend that you get the mentioned basic workup next visit with your PCP, your PCP may decide not to get them or add new tests based on their clinical decision)  On your  next visit with your PCP, please Get Medicines reviewed and adjusted.  Please request your PCP  to go over all Hospital Tests and Procedure/Radiological results at the follow up, please get all Hospital records sent to your Prim MD by signing hospital release before you go home.  Activity: As tolerated with Full fall precautions use walker/cane & assistance as needed  For Heart failure patients - Check your Weight same time everyday, if you gain over 2 pounds, or you develop in leg swelling, experience more shortness of breath or chest pain, call your Primary MD immediately. Follow Cardiac Low Salt Diet and 1.5 lit/day fluid restriction.  If you have smoked or chewed Tobacco in the last 2 yrs please stop smoking, stop any regular Alcohol  and or any Recreational drug use.  If you experience worsening of your admission symptoms, develop shortness of breath, life threatening emergency, suicidal or homicidal thoughts you must seek medical attention immediately by calling 911 or calling your MD immediately  if symptoms less severe.  You Must read complete instructions/literature along with all the  possible adverse reactions/side effects for all the Medicines you take and that have been prescribed to you. Take any new Medicines after you have completely understood and accpet all the possible adverse reactions/side effects.   Do not drive, operate heavy machinery, perform activities at heights, swimming or participation in water activities or provide baby sitting services if your were admitted for syncope or siezures until you have seen by Primary MD or a Neurologist and advised to do so again.  Do not drive when taking Pain medications.  Do not take more than prescribed Pain, Sleep and Anxiety Medications  Wear Seat belts while driving.   Please note You were cared for by a hospitalist during your hospital stay. If you have any questions about your discharge medications or the care you received while you were in the hospital after you are discharged, you can call the unit and asked to speak with the hospitalist on call if the hospitalist that took care of you is not available. Once you are discharged, your primary care physician will handle any further medical issues. Please note that NO REFILLS for any discharge medications will be authorized once you are discharged, as it is imperative that you return to your primary care physician (or establish a relationship with a primary care physician if you do not have one) for your aftercare needs so that they can reassess your need for medications and monitor your lab values.    Allergies as of 03/12/2020      Reactions   Atorvastatin Swelling   Lip swelling   Ciprocinonide [fluocinolone] Other (See Comments)   Kidney failure   Catapres [clonidine Hcl] Rash   Demadex [torsemide] Rash      Medication List    STOP taking these medications   amLODipine 5 MG tablet Commonly known as: NORVASC     TAKE these medications   Accu-Chek Aviva Plus test strip Generic drug: glucose blood 1 each 4 (four) times daily.   Accu-Chek Aviva Plus  w/Device Kit 1 each by Other route as directed.   allopurinol 300 MG tablet Commonly known as: ZYLOPRIM Take 300 mg by mouth daily.   aspirin 81 MG EC tablet Take 1 tablet (81 mg total) by mouth daily.   BD Pen Needle Nano 2nd Gen 32G X 4 MM Misc Generic drug: Insulin Pen Needle 1 each by Other route as directed.   Breo Ellipta 200-25  MCG/INH Aepb Generic drug: fluticasone furoate-vilanterol Inhale 1 puff into the lungs daily.   Cholecalciferol 50 MCG (2000 UT) Caps Take 2,000 Units by mouth daily.   disopyramide 100 MG 12 hr capsule Commonly known as: NORPACE CR Take 2 capsules (200 mg total) by mouth every 12 (twelve) hours.   ferrous sulfate 325 (65 FE) MG EC tablet Take 1 tablet by mouth daily.   Fish Oil 1000 MG Caps Take 1,000 mg by mouth 2 (two) times daily.   furosemide 80 MG tablet Commonly known as: LASIX Take 1 tablet (80 mg total) by mouth 2 (two) times daily.   Garlic 5364 MG Caps Take 1,000 mg by mouth daily.   glimepiride 4 MG tablet Commonly known as: AMARYL Take 4 mg by mouth 2 (two) times daily.   hydrOXYzine 25 MG tablet Commonly known as: ATARAX/VISTARIL Take 25 mg by mouth at bedtime.   ipratropium-albuterol 0.5-2.5 (3) MG/3ML Soln Commonly known as: DUONEB Inhale 3 mLs into the lungs every 4 (four) hours as needed (wheezing).   Lantus SoloStar 100 UNIT/ML Solostar Pen Generic drug: insulin glargine Inject 15 Units into the skin at bedtime.   losartan 100 MG tablet Commonly known as: COZAAR Take 1 tablet by mouth daily.   metFORMIN 500 MG tablet Commonly known as: GLUCOPHAGE Take 500 mg by mouth 2 (two) times daily with a meal.   metoprolol succinate 100 MG 24 hr tablet Commonly known as: TOPROL-XL Take 1 tablet (100 mg total) by mouth daily. Take with or immediately following a meal. Start taking on: March 13, 2020 What changed:   medication strength  how much to take  additional instructions  Another medication with the  same name was removed. Continue taking this medication, and follow the directions you see here.   montelukast 10 MG tablet Commonly known as: SINGULAIR Take 10 mg by mouth at bedtime.   multivitamin with minerals Tabs tablet Take 1 tablet by mouth daily.   nitroGLYCERIN 0.4 MG SL tablet Commonly known as: NITROSTAT Place 1 tablet (0.4 mg total) under the tongue every 5 (five) minutes as needed for chest pain.   OXYGEN Inhale 2 L into the lungs continuous.   polyethylene glycol 17 g packet Commonly known as: MIRALAX / GLYCOLAX Take 17 g by mouth daily. What changed:   when to take this  reasons to take this   potassium chloride SA 20 MEQ tablet Commonly known as: KLOR-CON Take 20 mEq by mouth daily.   rosuvastatin 40 MG tablet Commonly known as: CRESTOR Take 40 mg by mouth at bedtime.   Turmeric 500 MG Caps Take 1 capsule by mouth daily.   Ventolin HFA 108 (90 Base) MCG/ACT inhaler Generic drug: albuterol Inhale 2 puffs into the lungs every 6 (six) hours as needed for wheezing or shortness of breath.   VITAMIN B-12 PO Take 1 tablet by mouth daily.       Time coordinating discharge: 35 minutes  The results of significant diagnostics from this hospitalization (including imaging, microbiology, ancillary and laboratory) are listed below for reference.    Procedures and Diagnostic Studies:   DG Chest Portable 1 View  Result Date: 03/08/2020 CLINICAL DATA:  Shortness of breath EXAM: PORTABLE CHEST 1 VIEW COMPARISON:  July 14, 2019 FINDINGS: Lungs are clear. There is cardiomegaly with central pulmonary vascular prominence and rapid peripheral tapering. No evident adenopathy. There is aortic atherosclerosis. No bone lesions. IMPRESSION: Stable cardiomegaly with pulmonary arterial hypertension. Lungs clear. No adenopathy appreciable. Aortic Atherosclerosis (  ICD10-I70.0). Electronically Signed   By: Lowella Grip III M.D.   On: 03/08/2020 11:07   ECHOCARDIOGRAM  COMPLETE  Result Date: 03/09/2020    ECHOCARDIOGRAM REPORT   Patient Name:   AFIFA TRUAX Date of Exam: 03/09/2020 Medical Rec #:  [id number]    Height:       63.5 in Accession #:    [id number]   Weight:       293.0 lb Date of Birth:  02-04-1950    BSA:          2.288 m Patient Age:    84 years     BP:           145/70 mmHg Patient Gender: F            HR:           97 bpm. Exam Location:  Inpatient Procedure: 2D Echo, 3D Echo, Cardiac Doppler and Color Doppler Indications:    I50.40* Unspecified combined systolic (congestive) and diastolic                 (congestive) heart failure  History:        Patient has prior history of Echocardiogram examinations, most                 recent 07/16/2019. CHF and Cardiomyopathy, COPD, Aortic Valve                 Disease, Signs/Symptoms:Shortness of Breath, Dyspnea and Chest                 Pain; Risk Factors:Hypertension and Dyslipidemia. HOCM. Hypoxia.  Sonographer:    Roseanna Rainbow RDCS Referring Phys: [place]  Sonographer Comments: Technically difficult study due to poor echo windows, suboptimal parasternal window, suboptimal subcostal window and patient is morbidly obese. Image acquisition challenging due to patient body habitus. Paient could no longer stand apical images. Patient short of breath. Extremely difficult imaging windows. Could not isolate aortic gradients. Study delayed to get patient back in bed, and for cardiology consult. EKG malfunction. IMPRESSIONS  1. There is a dynamic LV gradient observered. A Peak LVOT / AV gradient of 126 mmHg was measured. The echo tech was not able to isolate the AV gradient. . . Left ventricular ejection fraction, by estimation, is 70 to 75%. The left ventricle has hyperdynamic function. The left ventricle has no regional wall motion abnormalities. Left ventricular diastolic parameters are consistent with Grade II diastolic dysfunction (pseudonormalization).  2. Right ventricular systolic function is normal. The right  ventricular size is normal.  3. The mitral valve is normal in structure. Trivial mitral valve regurgitation. Moderate mitral annular calcification.  4. The aortic valve is normal in structure. Aortic valve regurgitation is not visualized.  5. Aortic dilatation noted. There is moderate dilatation of the ascending aorta, measuring 42 mm. FINDINGS  Left Ventricle: There is a dynamic LV gradient observered. A Peak LVOT / AV gradient of 126 mmHg was measured. The echo tech was not able to isolate the AV gradient. Left ventricular ejection fraction, by estimation, is 70 to 75%. The left ventricle has  hyperdynamic function. The left ventricle has no regional wall motion abnormalities. The left ventricular internal cavity size was normal in size. There is no left ventricular hypertrophy. Left ventricular diastolic parameters are consistent with Grade II diastolic dysfunction (pseudonormalization). Right Ventricle: The right ventricular size is normal. No increase in right ventricular wall thickness. Right ventricular systolic function is normal. Left Atrium: Left  atrial size was normal in size. Right Atrium: Right atrial size was normal in size. Pericardium: There is no evidence of pericardial effusion. Mitral Valve: The mitral valve is normal in structure. Moderate mitral annular calcification. Trivial mitral valve regurgitation. MV peak gradient, 11.8 mmHg. The mean mitral valve gradient is 5.0 mmHg. Tricuspid Valve: The tricuspid valve is normal in structure. Tricuspid valve regurgitation is trivial. Aortic Valve: The aortic valve is normal in structure. Aortic valve regurgitation is not visualized. Pulmonic Valve: The pulmonic valve was normal in structure. Pulmonic valve regurgitation is not visualized. Aorta: Aortic dilatation noted. There is moderate dilatation of the ascending aorta, measuring 42 mm. IAS/Shunts: The atrial septum is grossly normal.  LEFT VENTRICLE PLAX 2D LVIDd:         4.20 cm      Diastology  LVIDs:         1.70 cm      LV e' medial:    5.66 cm/s LV PW:         2.90 cm      LV E/e' medial:  31.3 LV IVS:        2.40 cm      LV e' lateral:   9.90 cm/s LVOT diam:     1.70 cm      LV E/e' lateral: 17.9 LVOT Area:     2.27 cm  LV Volumes (MOD) LV vol d, MOD A2C: 119.0 ml LV vol d, MOD A4C: 111.0 ml LV vol s, MOD A2C: 27.1 ml LV vol s, MOD A4C: 46.4 ml LV SV MOD A2C:     91.9 ml LV SV MOD A4C:     111.0 ml LV SV MOD BP:      87.5 ml RIGHT VENTRICLE             IVC RV S prime:     13.70 cm/s  IVC diam: 2.50 cm TAPSE (M-mode): 2.2 cm LEFT ATRIUM             Index       RIGHT ATRIUM           Index LA diam:        4.70 cm 2.05 cm/m  RA Area:     12.30 cm LA Vol (A2C):   26.9 ml 11.76 ml/m RA Volume:   24.80 ml  10.84 ml/m LA Vol (A4C):   57.0 ml 24.92 ml/m LA Biplane Vol: 39.2 ml 17.13 ml/m                        PULMONIC VALVE AORTA                 PR End Diast Vel: 2.58 msec Ao Root diam: 3.30 cm Ao Asc diam:  4.20 cm MITRAL VALVE MV Area (PHT): 4.49 cm     SHUNTS MV Peak grad:  11.8 mmHg    Systemic Diam: 1.70 cm MV Mean grad:  5.0 mmHg MV Vmax:       1.72 m/s MV Vmean:      90.0 cm/s MV Decel Time: 169 msec MV E velocity: 177.00 cm/s MV A velocity: 138.00 cm/s MV E/A ratio:  1.28 Mertie Moores MD Electronically signed by Mertie Moores MD Signature Date/Time: 03/09/2020/3:26:27 PM    Final      Labs:   Basic Metabolic Panel: Recent Labs  Lab 03/08/20 2245 03/09/20 0222 03/10/20 0351 03/11/20 0941 03/12/20 0431  NA 143 142 140 138 141  K 4.0 3.9 3.8 3.7 3.6  CL 96* 97* 95* 94* 98  CO2 34* 31 31 33* 31  GLUCOSE 282* 409* 261* 213* 112*  BUN 29* 31* 36* 41* 42*  CREATININE 0.90 0.94 1.13* 1.14* 1.33*  CALCIUM 9.5 9.1 9.1 8.7* 8.8*  MG 2.2 2.0 2.3  --   --   PHOS  --  2.8 3.1  --   --    GFR Estimated Creatinine Clearance: 52.9 mL/min (A) (by C-G formula based on SCr of 1.33 mg/dL (H)). Liver Function Tests: Recent Labs  Lab 03/08/20 1215 03/09/20 0222  AST 17 22  ALT 19 24   ALKPHOS 52 58  BILITOT 0.6 1.0  PROT 7.4 7.0  ALBUMIN 4.3 4.0   No results for input(s): LIPASE, AMYLASE in the last 168 hours. No results for input(s): AMMONIA in the last 168 hours. Coagulation profile Recent Labs  Lab 03/08/20 1215  INR 1.0    CBC: Recent Labs  Lab 03/08/20 1215 03/08/20 1233 03/09/20 0222 03/10/20 0351 03/11/20 0941 03/12/20 0431  WBC 8.4  --  7.9 11.2* 8.0 8.7  NEUTROABS 6.1  --  7.3 9.0* 4.2 4.5  HGB 10.8* 11.9* 10.6* 10.4* 10.8* 10.1*  HCT 36.1 35.0* 36.1 35.4* 36.3 35.1*  MCV 91.6  --  92.8 92.2 91.9 93.4  PLT 138*  --  135* 156 150 152   Cardiac Enzymes: Recent Labs  Lab 03/08/20 2245  CKTOTAL 46   BNP: Invalid input(s): POCBNP CBG: Recent Labs  Lab 03/11/20 1709 03/11/20 2001 03/12/20 0000 03/12/20 0432 03/12/20 0753  GLUCAP 263* 229* 143* 108* 111*   D-Dimer No results for input(s): DDIMER in the last 72 hours. Hgb A1c No results for input(s): HGBA1C in the last 72 hours. Lipid Profile No results for input(s): CHOL, HDL, LDLCALC, TRIG, CHOLHDL, LDLDIRECT in the last 72 hours. Thyroid function studies No results for input(s): TSH, T4TOTAL, T3FREE, THYROIDAB in the last 72 hours.  Invalid input(s): FREET3 Anemia work up No results for input(s): VITAMINB12, FOLATE, FERRITIN, TIBC, IRON, RETICCTPCT in the last 72 hours. Microbiology Recent Results (from the past 240 hour(s))  Resp Panel by RT-PCR (Flu A&B, Covid) Nasopharyngeal Swab     Status: None   Collection Time: 03/08/20 12:14 PM   Specimen: Nasopharyngeal Swab; Nasopharyngeal(NP) swabs in vial transport medium  Result Value Ref Range Status   SARS Coronavirus 2 by RT PCR NEGATIVE NEGATIVE Final    Comment: (NOTE) SARS-CoV-2 target nucleic acids are NOT DETECTED.  The SARS-CoV-2 RNA is generally detectable in upper respiratory specimens during the acute phase of infection. The lowest concentration of SARS-CoV-2 viral copies this assay can detect is 138 copies/mL.  A negative result does not preclude SARS-Cov-2 infection and should not be used as the sole basis for treatment or other patient management decisions. A negative result may occur with  improper specimen collection/handling, submission of specimen other than nasopharyngeal swab, presence of viral mutation(s) within the areas targeted by this assay, and inadequate number of viral copies(<138 copies/mL). A negative result must be combined with clinical observations, patient history, and epidemiological information. The expected result is Negative.  Fact Sheet for Patients:  EntrepreneurPulse.com.au  Fact Sheet for Healthcare Providers:  IncredibleEmployment.be  This test is no t yet approved or cleared by the [place] FDA and  has been authorized for detection and/or diagnosis of SARS-CoV-2 by FDA under an Emergency Use Authorization (EUA). This EUA will remain  in effect (meaning this test can be  used) for the duration of the COVID-19 declaration under Section 564(b)(1) of the Act, 21 U.S.C.section 360bbb-3(b)(1), unless the authorization is terminated  or revoked sooner.       Influenza A by PCR NEGATIVE NEGATIVE Final   Influenza B by PCR NEGATIVE NEGATIVE Final    Comment: (NOTE) The Xpert Xpress SARS-CoV-2/FLU/RSV plus assay is intended as an aid in the diagnosis of influenza from Nasopharyngeal swab specimens and should not be used as a sole basis for treatment. Nasal washings and aspirates are unacceptable for Xpert Xpress SARS-CoV-2/FLU/RSV testing.  Fact Sheet for Patients: EntrepreneurPulse.com.au  Fact Sheet for Healthcare Providers: IncredibleEmployment.be  This test is not yet approved or cleared by the [place] FDA and has been authorized for detection and/or diagnosis of SARS-CoV-2 by FDA under an Emergency Use Authorization (EUA). This EUA will remain in effect (meaning this test  can be used) for the duration of the COVID-19 declaration under Section 564(b)(1) of the Act, 21 U.S.C. section 360bbb-3(b)(1), unless the authorization is terminated or revoked.  Performed at [facility], [address] [place], [place] [address]      Signed: Terrilee Croak  [facility] 03/12/2020, 11:35 AM

## 2020-03-12 NOTE — Progress Notes (Signed)
Progress Note  Patient Name: Morgan Fischer Date of Encounter: 03/12/2020  [facility] Cardiologist: Garwin Brothers, MD   Subjective   Feeling well. Breathing is improving. She was happy that she could breath laying in bed without her BIPAP or oxygen.  Notes that her daughter was recently diagnosed with HCM.  Inpatient Medications    Scheduled Meds: . allopurinol  300 mg Oral Daily  . aspirin EC  81 mg Oral Daily  . disopyramide  200 mg Oral Q12H  . enoxaparin (LOVENOX) injection  40 mg Subcutaneous Q24H  . fluticasone furoate-vilanterol  1 puff Inhalation Daily  . furosemide  80 mg Oral BID  . glimepiride  4 mg Oral BID WC  . insulin aspart  0-15 Units Subcutaneous Q4H  . insulin aspart  3 Units Subcutaneous TID WC  . insulin glargine  15 Units Subcutaneous QHS  . ipratropium-albuterol  3 mL Inhalation Q6H  . losartan  100 mg Oral Daily  . metoprolol succinate  100 mg Oral Daily  . montelukast  10 mg Oral QHS  . rosuvastatin  40 mg Oral QHS  . sodium chloride flush  3 mL Intravenous Q12H   Continuous Infusions: . sodium chloride     PRN Meds: sodium chloride, acetaminophen **OR** acetaminophen, albuterol, albuterol, HYDROcodone-acetaminophen, lip balm, phenol, polyethylene glycol, sodium chloride flush   Vital Signs    Vitals:   03/11/20 2208 03/12/20 0118 03/12/20 0437 03/12/20 0902  BP:   (!) 141/57   Pulse:   (!) 59   Resp: 19  18   Temp:   97.7 F (36.5 C)   TempSrc:   Oral   SpO2:  98% 99% 100%  Weight:   135.5 kg   Height:        Intake/Output Summary (Last 24 hours) at 03/12/2020 1113 Last data filed at 03/12/2020 0854 Gross per 24 hour  Intake 1010 ml  Output 1200 ml  Net -190 ml   Last 3 Weights 03/12/2020 03/11/2020 03/10/2020  Weight (lbs) 298 lb 11.6 oz 298 lb 15.1 oz 296 lb 8.3 oz  Weight (kg) 135.5 kg 135.6 kg 134.5 kg      Telemetry    Sinus rhythm.  PVCs  No events.- Personally Reviewed  ECG    n/a - Personally  Reviewed  Physical Exam   VS:  BP (!) 141/57 (BP Location: Left Arm)   Pulse (!) 59   Temp 97.7 F (36.5 C) (Oral)   Resp 18   Ht 5' 3.5" (1.613 m)   Wt 135.5 kg   LMP  (LMP Unknown)   SpO2 100%   BMI 52.09 kg/m  , BMI Body mass index is 52.09 kg/m. GENERAL:  Well appearing HEENT: Pupils equal round and reactive, fundi not visualized, oral mucosa unremarkable NECK:  No jugular venous distention, waveform within normal limits, carotid upstroke brisk and symmetric, no bruits LUNGS:  Clear to auscultation bilaterally HEART:  RRR.  PMI not displaced or sustained,S1 and S2 within normal limits, no S3, no S4, no clicks, no rubs, II/VI systolic murmur at the LLSB ABD:  Flat, positive bowel sounds normal in frequency in pitch, no bruits, no rebound, no guarding, no midline pulsatile mass, no hepatomegaly, no splenomegaly EXT:  2 plus pulses throughout, woody LE edema, no cyanosis no clubbing SKIN:  No rashes no nodules NEURO:  Cranial nerves II through XII grossly intact, motor grossly intact throughout PSYCH:  Cognitively intact, oriented to person place and time   Labs  High Sensitivity Troponin:   Recent Labs  Lab 03/08/20 1214 03/08/20 2059 03/08/20 2245 03/09/20 0222  TROPONINIHS 27*  30* 22* 24* 17      Chemistry Recent Labs  Lab 03/08/20 1215 03/08/20 1233 03/09/20 0222 03/10/20 0351 03/11/20 0941 03/12/20 0431  NA 144   < > 142 140 138 141  K 3.4*   < > 3.9 3.8 3.7 3.6  CL 96*   < > 97* 95* 94* 98  CO2 36*   < > 31 31 33* 31  GLUCOSE 165*   < > 409* 261* 213* 112*  BUN 32*   < > 31* 36* 41* 42*  CREATININE 0.86   < > 0.94 1.13* 1.14* 1.33*  CALCIUM 9.4   < > 9.1 9.1 8.7* 8.8*  PROT 7.4  --  7.0  --   --   --   ALBUMIN 4.3  --  4.0  --   --   --   AST 17  --  22  --   --   --   ALT 19  --  24  --   --   --   ALKPHOS 52  --  58  --   --   --   BILITOT 0.6  --  1.0  --   --   --   GFRNONAA >60   < > >60 52* 51* 43*  ANIONGAP 12   < > 14 14 11 12    < >  = values in this interval not displayed.     Hematology Recent Labs  Lab 03/10/20 0351 03/11/20 0941 03/12/20 0431  WBC 11.2* 8.0 8.7  RBC 3.84* 3.95 3.76*  HGB 10.4* 10.8* 10.1*  HCT 35.4* 36.3 35.1*  MCV 92.2 91.9 93.4  MCH 27.1 27.3 26.9  MCHC 29.4* 29.8* 28.8*  RDW 15.9* 15.9* 16.0*  PLT 156 150 152    BNP Recent Labs  Lab 03/08/20 1214  BNP 450.8*     DDimer  Recent Labs  Lab 03/08/20 2245  DDIMER 0.54*     Radiology    No results found.  Cardiac Studies   Echo 07/16/19: 1. Severe intracavitary gradient. Peak velocity 4.67 m/s. Peak gradient  87.2 mmHg. Systolic anterior motion of the mitral valve. Findings  consistent with HOCM. Left ventricular ejection fraction, by estimation,  is 60 to 65%. The left ventricle has  normal function. The left ventricle has no regional wall motion  abnormalities. There is severe concentric left ventricular hypertrophy.  Left ventricular diastolic parameters are consistent with Grade II  diastolic dysfunction (pseudonormalization).  2. Right ventricular systolic function is normal. The right ventricular  size is normal.  3. Left atrial size was mildly dilated.  4. The mitral valve is normal in structure. No evidence of mitral valve  regurgitation. No evidence of mitral stenosis.  5. The aortic valve is normal in structure. Aortic valve regurgitation is  not visualized. No aortic valve stenosis.  6. Aortic dilatation noted. There is mild dilatation of the ascending  aorta measuring 38 mm.  7. The inferior vena cava is dilated in size with >50% respiratory  variability, suggesting right atrial pressure of 8 mmHg.   Echo 09/09/20: 1. There is a dynamic LV gradient observered. A Peak LVOT / AV gradient  of 126 mmHg was measured. The echo tech was not able to isolate the AV  gradient. . . Left ventricular ejection fraction, by estimation, is 70 to  75%. The left ventricle  has  hyperdynamic function. The left  ventricle has no regional wall motion  abnormalities. Left ventricular diastolic parameters are consistent with  Grade II diastolic dysfunction (pseudonormalization).  2. Right ventricular systolic function is normal. The right ventricular  size is normal.  3. The mitral valve is normal in structure. Trivial mitral valve  regurgitation. Moderate mitral annular calcification.  4. The aortic valve is normal in structure. Aortic valve regurgitation is  not visualized.  5. Aortic dilatation noted. There is moderate dilatation of the ascending  aorta, measuring 42 mm. 1. There is a dynamic LV gradient observered. A Peak LVOT / AV gradient  of 126 mmHg was measured. The echo tech was not able to isolate the AV  gradient. . . Left ventricular ejection fraction, by estimation, is 70 to  75%. The left ventricle has  hyperdynamic function. The left ventricle has no regional wall motion  abnormalities. Left ventricular diastolic parameters are consistent with  Grade II diastolic dysfunction (pseudonormalization).  2. Right ventricular systolic function is normal. The right ventricular  size is normal.  3. The mitral valve is normal in structure. Trivial mitral valve  regurgitation. Moderate mitral annular calcification.  4. The aortic valve is normal in structure. Aortic valve regurgitation is  not visualized.  5. Aortic dilatation noted. There is moderate dilatation of the ascending  aorta, measuring 42 mm.   Patient Profile     70 y.o. 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  Assessment & Plan    # Acute on chronic diastolic heart failure: # HOCM:  Volume status is improving.  She was net even for ins and outs yesterday.  -4.5L this admission.  Discharge weight is 135.5 kg.  She still has significant lower extremity edema.  However I think  some of this is venous stasis.  Recommend elevation of legs and compression stockings.  Her breathing is much better and she is able to lay flat.  Renal function is worsening with diuresis.  She was transitioned back to furosemide 80 mg twice daily.  She understands that she needs to weigh herself and if her weight goes up by 2 pounds in a day or 5 pounds over the course of the week she should take an additional dose of Lasix.  This hospitalization her metoprolol was increased to 100 mg and she was started on disopyramide.  She is feeling much better.  We did discuss limiting her sodium to 1500 mg and fluids to 1.5 L.  She does note that she struggles with fluid intake at home due to thirst.  Echo shows severe concentric hypertrophy with septal wall per 2.4 cm and posterior wall 2.9 cm.  She has near cavity obliteration.  Her intracavitary gradient is quite high.  The peak velocity is 5.6 m/s and the peak gradient is 126 mmHg.  She is feeling better and her murmur seems less pronounced on exam.  She would be a good candidate for [place] at [facility].  We have arranged for her to see Dr. Regino Schultze at [facility]..  She does have a history of NSVT.  She denies any family history of sudden cardiac death, but her daughter was also recently diagnosed with HOCM.  Recommend genetic testing as an outpatient.  She is unable to undergo cardiac MRI due to claustrophobia.  Her echo findings and EKG are more consistent with hypertrophic cardiomyopathy than  infiltrative cardiomyopathy/restrictive cardiomyopathy.  Her atria  are not particularly enlarged.  She has grade 2 diastolic dysfunction rather than a more restrictive filling pattern.  Would be reasonable to consider PYP scan as an outpatient.  No plan for ICD given her age.  # Chest pain: # Demand ischemia: High-sensitivity troponin was elevated but flat ranging from 30-36.  Findings not consistent with ACS.  More consistent with demand ischemia.  # OSA:  Continue bipap use.   #  HTN: BP stable on metoprolol and losartan.   # Hyperlipidemia;  Continue rosuvastatin.  # NSVT:  None in the last 24 hours.  Continue metoprolol.  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  Reassess in 6 months.  BP control as above.   [facility] will sign off.   Medication Recommendations:  Consolidate disopyramide to 200 mg twice daily Other recommendations (labs, testing, etc): Basic metabolic panel at clinic follow-up next week Follow up as an outpatient: Scheduled for 3/14  For questions or updates, please contact [facility] Please consult [email or web address] for contact info under        Signed, Chilton Si, MD  03/12/2020, 11:13 AM

## 2020-03-12 NOTE — Progress Notes (Signed)
AVS given to patient and explained at the bedside. Medications and follow up appointments have been explained with pt verbalizing understanding.  

## 2020-03-12 NOTE — Plan of Care (Signed)
  Problem: Coping: Goal: Level of anxiety will decrease Outcome: Progressing   Problem: Cardiac: Goal: Ability to achieve and maintain adequate cardiopulmonary perfusion will improve Outcome: Progressing   Problem: Education: Goal: Knowledge of General Education information will improve Description: Including pain rating scale, medication(s)/side effects and non-pharmacologic comfort measures Outcome: Progressing

## 2020-03-12 NOTE — Consult Note (Signed)
   [facility] CM Inpatient Consult   03/12/2020  Margorie Renner Aug 06, 1950 [id number]  Patient chart reviewed for potential [facility] CM) services due to high unplanned readmission risk score.   Spoke with Ms. Brodhead by telephone, HIPAA verified. Reviewed the [facility] CM outpatient services for assistance with chronic disease, case management and assistance with possible community needs. Patient verbally consents to a post hospital follow up from [facility] CM RN case manager. I explained that the [facility] CM services do not interfere with any arrangements made through inpatient social work or case management.   Referral for post hospital follow up will be placed.  Christophe Louis, MSN, RN [facility] Liaison Nurse Mobile Phone [phone]  Toll free office [phone]

## 2020-03-13 ENCOUNTER — Other Ambulatory Visit: Payer: Self-pay | Admitting: Internal Medicine

## 2020-03-13 MED ORDER — DISOPYRAMIDE PHOSPHATE ER 100 MG PO CP12: 200.0000 mg | ORAL_CAPSULE | Freq: Two times a day (BID) | ORAL | 2 refills | Status: DC

## 2020-03-13 NOTE — Progress Notes (Signed)
Recent patient's prescription of disopyramide to a different [facility] at [facility] based on patient's request.

## 2020-03-15 ENCOUNTER — Encounter: Payer: Self-pay | Admitting: Cardiology

## 2020-03-15 ENCOUNTER — Other Ambulatory Visit: Payer: Self-pay

## 2020-03-15 ENCOUNTER — Ambulatory Visit: Payer: Medicare HMO | Admitting: Cardiology

## 2020-03-15 VITALS — BP 148/60 | HR 66 | Ht 63.6 in | Wt 296.0 lb

## 2020-03-15 DIAGNOSIS — I429 Cardiomyopathy, unspecified: Secondary | ICD-10-CM | POA: Insufficient documentation

## 2020-03-15 DIAGNOSIS — M179 Osteoarthritis of knee, unspecified: Secondary | ICD-10-CM | POA: Insufficient documentation

## 2020-03-15 DIAGNOSIS — I872 Venous insufficiency (chronic) (peripheral): Secondary | ICD-10-CM | POA: Insufficient documentation

## 2020-03-15 DIAGNOSIS — E782 Mixed hyperlipidemia: Secondary | ICD-10-CM

## 2020-03-15 DIAGNOSIS — M1710 Unilateral primary osteoarthritis, unspecified knee: Secondary | ICD-10-CM | POA: Insufficient documentation

## 2020-03-15 DIAGNOSIS — I10 Essential (primary) hypertension: Secondary | ICD-10-CM | POA: Diagnosis not present

## 2020-03-15 DIAGNOSIS — E785 Hyperlipidemia, unspecified: Secondary | ICD-10-CM

## 2020-03-15 DIAGNOSIS — I119 Hypertensive heart disease without heart failure: Secondary | ICD-10-CM

## 2020-03-15 DIAGNOSIS — I421 Obstructive hypertrophic cardiomyopathy: Secondary | ICD-10-CM

## 2020-03-15 DIAGNOSIS — E119 Type 2 diabetes mellitus without complications: Secondary | ICD-10-CM

## 2020-03-15 DIAGNOSIS — E611 Iron deficiency: Secondary | ICD-10-CM

## 2020-03-15 DIAGNOSIS — I110 Hypertensive heart disease with heart failure: Secondary | ICD-10-CM

## 2020-03-15 DIAGNOSIS — F419 Anxiety disorder, unspecified: Secondary | ICD-10-CM

## 2020-03-15 DIAGNOSIS — I059 Rheumatic mitral valve disease, unspecified: Secondary | ICD-10-CM

## 2020-03-15 HISTORY — DX: Mixed hyperlipidemia: E78.2

## 2020-03-15 HISTORY — DX: Venous insufficiency (chronic) (peripheral): I87.2

## 2020-03-15 HISTORY — DX: Hyperlipidemia, unspecified: E78.5

## 2020-03-15 HISTORY — DX: Cardiomyopathy, unspecified: I42.9

## 2020-03-15 HISTORY — DX: Anxiety disorder, unspecified: F41.9

## 2020-03-15 HISTORY — DX: Hypertensive heart disease without heart failure: I11.9

## 2020-03-15 HISTORY — DX: Type 2 diabetes mellitus without complications: E11.9

## 2020-03-15 HISTORY — DX: Unilateral primary osteoarthritis, unspecified knee: M17.10

## 2020-03-15 HISTORY — DX: Iron deficiency: E61.1

## 2020-03-15 HISTORY — DX: Rheumatic mitral valve disease, unspecified: I05.9

## 2020-03-15 HISTORY — DX: Osteoarthritis of knee, unspecified: M17.9

## 2020-03-15 NOTE — Patient Outreach (Signed)
[facility]) Care Management  [place]  03/15/2020   Morgan Fischer Sep 06, 1950 [id number]  Subjective: Spoke with patient. She has follow up with cardiologist on today.  Patient lives with daughter Morgan Fischer and has caregivers through the [place] 4 days a week for 4 hours.  Daughter and aide help patient with iADL's. Patient states she is still able to bath herself. Patient has orders for home health through [place]. Patient states she does have contact information if they do not reach out by Tuesday.    Discussed with patient Heart Failure and daily management and rescue plan. Patient agreeable to heart failure management.   Discussed [facility] services and support. Patient agreeable to CM outreach.  Patient with medical history significant of diastolic congestive heart failure as well as chronic respiratory dysfunction on 2 L with asthma/COPD.,  CKD, DM2, HTN, gout complete heart block, HLD, hypertrophic obstructive cardiomyopathy, morbid obesity, obstructive sleep apnea.  Recently hospitalized with dropping oxygen saturations and shortness of breath.  Patient with acute heart failure. Patient was diuresed and placed on 1.5 fluid restriction.    Objective:   Encounter Medications:  Outpatient Encounter Medications as of 03/15/2020  Medication Sig  . ACCU-CHEK AVIVA PLUS test strip 1 each 4 (four) times daily.  Marland Kitchen allopurinol (ZYLOPRIM) 300 MG tablet Take 300 mg by mouth daily.  Marland Kitchen aspirin EC 81 MG EC tablet Take 1 tablet (81 mg total) by mouth daily.  . BD PEN NEEDLE NANO 2ND GEN 32G X 4 MM MISC 1 each by Other route as directed.   . Blood Glucose Monitoring Suppl (ACCU-CHEK AVIVA PLUS) w/Device KIT 1 each by Other route as directed.   Marland Kitchen BREO ELLIPTA 200-25 MCG/INH AEPB Inhale 1 puff into the lungs daily.  . Cholecalciferol 50 MCG (2000 UT) CAPS Take 2,000 Units by mouth daily.  . Cyanocobalamin (VITAMIN B-12 PO) Take 1 tablet by mouth daily.  . disopyramide (NORPACE CR) 100  MG 12 hr capsule Take 2 capsules (200 mg total) by mouth every 12 (twelve) hours.  . ferrous sulfate 325 (65 FE) MG EC tablet Take 1 tablet by mouth daily.  . furosemide (LASIX) 80 MG tablet Take 1 tablet (80 mg total) by mouth 2 (two) times daily.  . Garlic 2376 MG CAPS Take 1,000 mg by mouth daily.  Marland Kitchen glimepiride (AMARYL) 4 MG tablet Take 4 mg by mouth 2 (two) times daily.  . hydrOXYzine (ATARAX/VISTARIL) 25 MG tablet Take 25 mg by mouth at bedtime.  Marland Kitchen ipratropium-albuterol (DUONEB) 0.5-2.5 (3) MG/3ML SOLN Inhale 3 mLs into the lungs every 4 (four) hours as needed (wheezing).  Marland Kitchen LANTUS SOLOSTAR 100 UNIT/ML Solostar Pen Inject 15 Units into the skin at bedtime.  Marland Kitchen losartan (COZAAR) 100 MG tablet Take 1 tablet by mouth daily.  . metFORMIN (GLUCOPHAGE) 500 MG tablet Take 500 mg by mouth 2 (two) times daily with a meal.  . metoprolol succinate (TOPROL-XL) 100 MG 24 hr tablet Take 1 tablet (100 mg total) by mouth daily. Take with or immediately following a meal.  . montelukast (SINGULAIR) 10 MG tablet Take 10 mg by mouth at bedtime.  . Multiple Vitamin (MULTIVITAMIN WITH MINERALS) TABS tablet Take 1 tablet by mouth daily.  . nitroGLYCERIN (NITROSTAT) 0.4 MG SL tablet Place 1 tablet (0.4 mg total) under the tongue every 5 (five) minutes as needed for chest pain.  . Omega-3 Fatty Acids (FISH OIL) 1000 MG CAPS Take 1,000 mg by mouth 2 (two) times daily.   Marland Kitchen  OXYGEN Inhale 2 L into the lungs continuous.  . polyethylene glycol (MIRALAX / GLYCOLAX) 17 g packet Take 17 g by mouth daily. (Patient taking differently: Take 17 g by mouth daily as needed.)  . potassium chloride SA (KLOR-CON) 20 MEQ tablet Take 20 mEq by mouth daily.  . rosuvastatin (CRESTOR) 40 MG tablet Take 40 mg by mouth at bedtime.  . Turmeric 500 MG CAPS Take 1 capsule by mouth daily.  . VENTOLIN HFA 108 (90 Base) MCG/ACT inhaler Inhale 2 puffs into the lungs every 6 (six) hours as needed for wheezing or shortness of breath.    No  facility-administered encounter medications on file as of 03/15/2020.    Functional Status:  In your present state of health, do you have any difficulty performing the following activities: 03/15/2020 03/08/2020  Hearing? N N  Vision? N N  Difficulty concentrating or making decisions? N N  Walking or climbing stairs? Y Y  Comment shortness of breath -  Dressing or bathing? Morgan Fischer  Comment has aide -  Doing errands, shopping? Morgan Fischer  Comment family assists -  Conservation officer, nature and eating ? Y -  Comment aide and family help -  Using the Toilet? N -  In the past six months, have you accidently leaked urine? N -  Do you have problems with loss of bowel control? N -  Managing your Medications? N -  Managing your Finances? N -  Housekeeping or managing your Housekeeping? Y -  Comment family helps -  Some recent data might be hidden    Fall/Depression Screening: Fall Risk  03/15/2020 02/08/2017 12/28/2016  Falls in the past year? 0 No No  Injury with Fall? - No -  Risk for fall due to : - Medication side effect -  Risk for fall due to: Comment - - -   PHQ 2/9 Scores 03/15/2020 12/28/2016 08/18/2016 04/25/2016 10/12/2015 09/29/2015  PHQ - 2 Score 0 0 0 0 0 0    Assessment:  Goals Addressed            This Visit's Progress   . Make and Keep All Appointments       Timeframe:  Long-Range Goal Priority:  High Start Date:    03/15/20                         Expected End Date:     10/01/20                  Follow Up Date 04/01/20   - arrange a ride through an agency 1 week before appointment    Why is this important?    Part of staying healthy is seeing the doctor for follow-up care.   If you forget your appointments, there are some things you can do to stay on track.    Notes: 03/15/20 daughter takes to appointments.      . Track and Manage Fluids and Swelling-Heart Failure       Timeframe:  Short-Term Goal Priority:  High Start Date:    03/15/20                     Expected End Date:   05/31/20                     Follow Up Date 04/01/20   - call office if I gain more than 2 pounds in one day or 5  pounds in one week - weigh myself daily    Why is this important?    It is important to check your weight daily and watch how much salt and liquids you have.   It will help you to manage your heart failure.    Notes: 03/15/20 Discussed importance of hart failure regimen.     . Track and Manage Symptoms-Heart Failure       Timeframe:  Long-Range Goal Priority:  High Start Date:    03/15/20                         Expected End Date: 10/01/20                       Follow Up Date 04/01/20    - develop a rescue plan - track symptoms and what helps feel better or worse    Why is this important?    You will be able to handle your symptoms better if you keep track of them.   Making some simple changes to your lifestyle will help.   Eating healthy is one thing you can do to take good care of yourself.    Notes: 03/15/20 Discussed with patient heart failure zones and rescue plan.        Plan: RN CM will provide ongoing education and support to patient through phone calls.   RN CM will send welcome packet with consent to patient.   RN CM will send initial barriers letter, assessment, and care plan to primary care physician.   RN CM will contact patient next week and patient agrees to next contact.   Follow-up:  Patient agrees to Care Plan and Follow-up.  Jone Baseman, RN, MSN [place] [facility] Care Management Coordinator Direct Line [phone] Cell [phone] Toll Free: [phone]  Fax: [phone]

## 2020-03-15 NOTE — Patient Instructions (Signed)

## 2020-03-15 NOTE — Patient Instructions (Signed)
Goals    . Make and Keep All Appointments     Timeframe:  Long-Range Goal Priority:  High Start Date:    03/15/20                         Expected End Date:     10/01/20                  Follow Up Date 04/01/20   - arrange a ride through an agency 1 week before appointment    Why is this important?    Part of staying healthy is seeing the doctor for follow-up care.   If you forget your appointments, there are some things you can do to stay on track.    Notes: 03/15/20 daughter takes to appointments.      . Track and Manage Fluids and Swelling-Heart Failure     Timeframe:  Short-Term Goal Priority:  High Start Date:    03/15/20                     Expected End Date:  05/31/20                     Follow Up Date 04/01/20   - call office if I gain more than 2 pounds in one day or 5 pounds in one week - weigh myself daily    Why is this important?    It is important to check your weight daily and watch how much salt and liquids you have.   It will help you to manage your heart failure.    Notes: 03/15/20 Discussed importance of hart failure regimen.     . Track and Manage Symptoms-Heart Failure     Timeframe:  Long-Range Goal Priority:  High Start Date:    03/15/20                         Expected End Date: 10/01/20                       Follow Up Date 04/01/20    - develop a rescue plan - track symptoms and what helps feel better or worse    Why is this important?    You will be able to handle your symptoms better if you keep track of them.   Making some simple changes to your lifestyle will help.   Eating healthy is one thing you can do to take good care of yourself.    Notes: 03/15/20 Discussed with patient heart failure zones and rescue plan.

## 2020-03-15 NOTE — Progress Notes (Signed)
Cardiology Office Note:    Date:  03/15/2020   ID:  Morgan Fischer, DOB [birth year], MRN [id number]  PCP:  Jackie Plum, MD  Cardiologist:  Garwin Brothers, MD   Referring MD: Jackie Plum, MD    ASSESSMENT:    1. Mixed hyperlipidemia   2. Hypertensive heart disease with heart failure (HCC)   3. HOCM (hypertrophic obstructive cardiomyopathy) (HCC)   4. Essential hypertension    PLAN:    In order of problems listed above:  1. Primary prevention stressed with the patient.  Importance of compliance with diet medication stressed and she vocalized understanding. 2. Essential hypertension: Blood pressure stable and diet was emphasized. 3. Hypertrophic obstructive cardiomyopathy: I discussed echo report with her at length.  I reviewed this preadmission records.  She is feeling fine and much better.  She has an appointment with the [facility] referral cardiologist for condition. 4. Mixed dyslipidemia: Diet was emphasized and lipids were reviewed 5. Morbid obesity: I discussed with the patient risks of obesity and she plans to do better.  She uses oxygen 24 hours of the day this has been recommended by primary care provider. 6. Patient will be seen in follow-up appointment in 3 months or earlier if the patient has any concerns    Medication Adjustments/Labs and Tests Ordered: Current medicines are reviewed at length with the patient today.  Concerns regarding medicines are outlined above.  No orders of the defined types were placed in this encounter.  No orders of the defined types were placed in this encounter.    No chief complaint on file.    History of Present Illness:    Morgan Fischer is a 70 y.o. female.  Patient has past medical history of hypertrophic cardiomyopathy, essential hypertension, congestive heart failure and mixed dyslipidemia.  She is morbidly obese and uses oxygen all the time.  Her BMI is greater than 50.  She was recently admitted to the hospital and  congestive heart failure.  She has been initiated on Norpace and she plans to begin taking it today.  She has an appointment with the [facility] cardiologist for hypertrophic cardiomyopathy at the end of the month.  She denies any chest pain orthopnea or PND.  She leads a sedentary lifestyle.  Her daughter accompanies her for this visit.  At the time of my evaluation, the patient is alert awake oriented and in no distress.  Past Medical History:  Diagnosis Date  . Acute diastolic heart failure (HCC) 07/17/2019  . Acute hypercapnic respiratory failure (HCC) 07/21/2016  . Acute on chronic congestive heart failure (HCC) 07/19/2016  . Acute on chronic diastolic CHF (congestive heart failure) (HCC) 03/08/2020  . Acute on chronic respiratory failure with hypoxia (HCC) 10/20/2018  . Acute on chronic respiratory failure with hypoxia and hypercapnia (HCC) 03/08/2020  . Acute respiratory acidosis 07/19/2016  . Acute respiratory distress 07/19/2016  . Acute respiratory failure (HCC) 03/08/2020  . Aortic stenosis 11/01/2018  . Arthritis   . Asthma   . Atherosclerosis of native coronary artery of native heart without angina pectoris 03/17/2016  . Chest pain 07/14/2019  . CHF (congestive heart failure) (HCC)   . Chronic diastolic heart failure (HCC) 03/17/2016  . Chronic hypoxemic respiratory failure (HCC)   . CKD (chronic kidney disease)   . COPD (chronic obstructive pulmonary disease) (HCC)   . COPD with acute exacerbation (HCC) 03/08/2020  . Diabetes mellitus without complication (HCC)   . Drug allergy 07/21/2016  . Essential (primary) hypertension 11/19/2016  .  Gout 11/19/2016  . Heart block AV complete (HCC)   . High cholesterol   . HOCM (hypertrophic obstructive cardiomyopathy) (HCC) 09/18/2017  . Hyperglycemia due to type 2 diabetes mellitus (HCC) 07/19/2016  . Hypertension   . Hypertensive heart disease with heart failure (HCC) 03/17/2016  . Hypokalemia 08/24/2015  . Hypomagnesemia   . Hypoxia 03/17/2016  .  Leukocytosis 07/19/2016  . Morbid obesity (HCC)   . Nonrheumatic aortic valve stenosis 03/17/2016  . Obesity hypoventilation syndrome (HCC) 03/08/2020  . Obstructive sleep apnea 11/19/2016  . Panniculitis 08/23/2015  . Pure hypercholesterolemia 02/13/2017  . SOB (shortness of breath) 08/24/2015  . Troponin level elevated 08/24/2015  . Type 2 diabetes mellitus, without long-term current use of insulin (HCC) 11/19/2016    Past Surgical History:  Procedure Laterality Date  . ABDOMINAL HYSTERECTOMY    . CESAREAN SECTION      Current Medications: Current Meds  Medication Sig  . ACCU-CHEK AVIVA PLUS test strip 1 each 4 (four) times daily.  Marland Kitchen allopurinol (ZYLOPRIM) 300 MG tablet Take 300 mg by mouth daily.  Marland Kitchen aspirin EC 81 MG EC tablet Take 1 tablet (81 mg total) by mouth daily.  Marland Kitchen BREO ELLIPTA 200-25 MCG/INH AEPB Inhale 1 puff into the lungs daily.  . Cholecalciferol 50 MCG (2000 UT) CAPS Take 2,000 Units by mouth daily.  . Cyanocobalamin (VITAMIN B-12 PO) Take 1 tablet by mouth daily.  . diclofenac Sodium (VOLTAREN) 1 % GEL Apply 1 application topically as needed (pain).  Marland Kitchen disopyramide (NORPACE CR) 100 MG 12 hr capsule Take 2 capsules (200 mg total) by mouth every 12 (twelve) hours.  . ferrous sulfate 325 (65 FE) MG EC tablet Take 1 tablet by mouth daily.  . furosemide (LASIX) 80 MG tablet Take 1 tablet (80 mg total) by mouth 2 (two) times daily.  . Garlic 1000 MG CAPS Take 1,000 mg by mouth daily.  Marland Kitchen glimepiride (AMARYL) 4 MG tablet Take 4 mg by mouth 2 (two) times daily.  . hydrOXYzine (ATARAX/VISTARIL) 25 MG tablet Take 25 mg by mouth at bedtime.  Marland Kitchen ipratropium-albuterol (DUONEB) 0.5-2.5 (3) MG/3ML SOLN Inhale 3 mLs into the lungs every 4 (four) hours as needed (wheezing).  Marland Kitchen LANTUS SOLOSTAR 100 UNIT/ML Solostar Pen Inject 15 Units into the skin at bedtime.  Marland Kitchen losartan (COZAAR) 100 MG tablet Take 1 tablet by mouth daily.  . metFORMIN (GLUCOPHAGE) 500 MG tablet Take 500 mg by mouth 2 (two)  times daily with a meal.  . metoprolol succinate (TOPROL-XL) 100 MG 24 hr tablet Take 1 tablet (100 mg total) by mouth daily. Take with or immediately following a meal.  . montelukast (SINGULAIR) 10 MG tablet Take 10 mg by mouth at bedtime.  . Multiple Vitamin (MULTIVITAMIN WITH MINERALS) TABS tablet Take 1 tablet by mouth daily.  . nitroGLYCERIN (NITROSTAT) 0.4 MG SL tablet Place 1 tablet (0.4 mg total) under the tongue every 5 (five) minutes as needed for chest pain.  . Omega-3 Fatty Acids (FISH OIL) 1000 MG CAPS Take 1,000 mg by mouth 2 (two) times daily.   . OXYGEN Inhale 2 L into the lungs continuous.  . polyethylene glycol (MIRALAX / GLYCOLAX) 17 g packet Take 17 g by mouth as needed for mild constipation.  . potassium chloride SA (KLOR-CON) 20 MEQ tablet Take 20 mEq by mouth daily.  . Probiotic Product (PROBIOTIC PO) Take 1 tablet by mouth daily in the afternoon.  . rosuvastatin (CRESTOR) 40 MG tablet Take 40 mg by mouth at  bedtime.  . Turmeric 500 MG CAPS Take 1 capsule by mouth daily.  . VENTOLIN HFA 108 (90 Base) MCG/ACT inhaler Inhale 2 puffs into the lungs every 6 (six) hours as needed for wheezing or shortness of breath.      Allergies:   Atorvastatin, Ciprocinonide [fluocinolone], Catapres [clonidine hcl], and Demadex [torsemide]   Social History   Socioeconomic History  . Marital status: Widowed    Spouse name: Not on file  . Number of children: Not on file  . Years of education: Not on file  . Highest education level: Not on file  Occupational History  . Not on file  Tobacco Use  . Smoking status: Never Smoker  . Smokeless tobacco: Never Used  Vaping Use  . Vaping Use: Never used  Substance and Sexual Activity  . Alcohol use: No  . Drug use: No  . Sexual activity: Never  Other Topics Concern  . Not on file  Social History Narrative  . Not on file   Social Determinants of Health   Financial Resource Strain: Not on file  Food Insecurity: Not on file   Transportation Needs: No Transportation Needs  . Lack of Transportation (Medical): No  . Lack of Transportation (Non-Medical): No  Physical Activity: Not on file  Stress: Not on file  Social Connections: Not on file     Family History: The patient's family history includes Breast cancer in her sister; CAD in her mother; Cancer in her father and sister; Diabetes in her mother; Diabetes Mellitus I in her mother; Heart disease in her father.  ROS:   Please see the history of present illness.    All other systems reviewed and are negative.  EKGs/Labs/Other Studies Reviewed:    The following studies were reviewed today: I discussed my findings with the patient at length.  Echocardiogram report was reviewed from the hospital.   Recent Labs: 03/08/2020: B Natriuretic Peptide 450.8 03/09/2020: ALT 24; TSH 0.662 03/10/2020: Magnesium 2.3 03/12/2020: BUN 42; Creatinine, Ser 1.33; Hemoglobin 10.1; Platelets 152; Potassium 3.6; Sodium 141  Recent Lipid Panel    Component Value Date/Time   CHOL 181 08/24/2015 0422   TRIG 100 08/24/2015 0422   HDL 43 08/24/2015 0422   CHOLHDL 4.2 08/24/2015 0422   VLDL 20 08/24/2015 0422   LDLCALC 118 (H) 08/24/2015 0422    Physical Exam:    VS:  BP (!) 148/60   Pulse 66   Ht 5' 3.6" (1.615 m)   Wt 296 lb 0.6 oz (134.3 kg)   LMP  (LMP Unknown)   SpO2 99%   BMI 51.46 kg/m     Wt Readings from Last 3 Encounters:  03/15/20 296 lb 0.6 oz (134.3 kg)  03/12/20 298 lb 11.6 oz (135.5 kg)  11/10/19 289 lb 0.6 oz (131.1 kg)     GEN: Patient is in no acute distress HEENT: Normal NECK: No JVD; No carotid bruits LYMPHATICS: No lymphadenopathy CARDIAC: Hear sounds regular, 2/6 systolic murmur at the apex. RESPIRATORY:  Clear to auscultation without rales, wheezing or rhonchi  ABDOMEN: Soft, non-tender, non-distended MUSCULOSKELETAL:  No edema; No deformity  SKIN: Warm and dry NEUROLOGIC:  Alert and oriented x 3 PSYCHIATRIC:  Normal affect    Signed, Garwin Brothers, MD  03/15/2020 11:53 AM     [facility]

## 2020-03-16 DIAGNOSIS — I130 Hypertensive heart and chronic kidney disease with heart failure and stage 1 through stage 4 chronic kidney disease, or unspecified chronic kidney disease: Secondary | ICD-10-CM | POA: Diagnosis not present

## 2020-03-16 DIAGNOSIS — E1122 Type 2 diabetes mellitus with diabetic chronic kidney disease: Secondary | ICD-10-CM | POA: Diagnosis not present

## 2020-03-16 DIAGNOSIS — G4733 Obstructive sleep apnea (adult) (pediatric): Secondary | ICD-10-CM | POA: Diagnosis not present

## 2020-03-16 DIAGNOSIS — J449 Chronic obstructive pulmonary disease, unspecified: Secondary | ICD-10-CM | POA: Diagnosis not present

## 2020-03-16 DIAGNOSIS — I5033 Acute on chronic diastolic (congestive) heart failure: Secondary | ICD-10-CM | POA: Diagnosis not present

## 2020-03-16 DIAGNOSIS — I421 Obstructive hypertrophic cardiomyopathy: Secondary | ICD-10-CM | POA: Diagnosis not present

## 2020-03-16 DIAGNOSIS — E7800 Pure hypercholesterolemia, unspecified: Secondary | ICD-10-CM | POA: Diagnosis not present

## 2020-03-16 DIAGNOSIS — N182 Chronic kidney disease, stage 2 (mild): Secondary | ICD-10-CM | POA: Diagnosis not present

## 2020-03-16 DIAGNOSIS — I2721 Secondary pulmonary arterial hypertension: Secondary | ICD-10-CM | POA: Diagnosis not present

## 2020-03-22 ENCOUNTER — Other Ambulatory Visit: Payer: Self-pay

## 2020-03-22 NOTE — Patient Instructions (Signed)
Goals    . Make and Keep All Appointments     Timeframe:  Long-Range Goal Priority:  High Start Date:    03/15/20                         Expected End Date:     10/01/20                  Follow Up Date 04/01/20   - call to cancel if needed    Why is this important?    Part of staying healthy is seeing the doctor for follow-up care.   If you forget your appointments, there are some things you can do to stay on track.    Notes: 03/15/20 daughter takes to appointments.      . Track and Manage Fluids and Swelling-Heart Failure     Timeframe:  Short-Term Goal Priority:  High Start Date:    03/15/20                     Expected End Date:  05/31/20                     Follow Up Date 04/01/20   - keep legs up while sitting - track weight in diary    Why is this important?    It is important to check your weight daily and watch how much salt and liquids you have.   It will help you to manage your heart failure.    Notes: 03/15/20 Discussed importance of hart failure regimen.  03/22/20 Keep up the great work!    . Track and Manage Symptoms-Heart Failure     Timeframe:  Long-Range Goal Priority:  High Start Date:    03/15/20                         Expected End Date: 10/01/20                       Follow Up Date 04/01/20    - follow rescue plan if symptoms flare-up - know when to call the doctor    Why is this important?    You will be able to handle your symptoms better if you keep track of them.   Making some simple changes to your lifestyle will help.   Eating healthy is one thing you can do to take good care of yourself.    Notes: 03/15/20 Discussed with patient heart failure zones and rescue plan.  03/22/20 Continue to monitor for signs of heart failure.

## 2020-03-22 NOTE — Patient Outreach (Signed)
[facility]) Care Management  [facility] Care Manager  03/22/2020   Morgan Fischer Oct 30, 1950 [id number]  Subjective: Telephone call to patient for follow up. Patient reports she is doing good.  Patient managing heart failure. Saw Cardiologist with no changes in regimen. Encouraged to continue heart failure management.   Objective:   Encounter Medications:  Outpatient Encounter Medications as of 03/22/2020  Medication Sig  . ACCU-CHEK AVIVA PLUS test strip 1 each 4 (four) times daily.  Marland Kitchen allopurinol (ZYLOPRIM) 300 MG tablet Take 300 mg by mouth daily.  Marland Kitchen aspirin EC 81 MG EC tablet Take 1 tablet (81 mg total) by mouth daily.  Marland Kitchen BREO ELLIPTA 200-25 MCG/INH AEPB Inhale 1 puff into the lungs daily.  . Cholecalciferol 50 MCG (2000 UT) CAPS Take 2,000 Units by mouth daily.  . Cyanocobalamin (VITAMIN B-12 PO) Take 1 tablet by mouth daily.  . diclofenac Sodium (VOLTAREN) 1 % GEL Apply 1 application topically as needed (pain).  Marland Kitchen disopyramide (NORPACE CR) 100 MG 12 hr capsule Take 2 capsules (200 mg total) by mouth every 12 (twelve) hours.  . ferrous sulfate 325 (65 FE) MG EC tablet Take 1 tablet by mouth daily.  . furosemide (LASIX) 80 MG tablet Take 1 tablet (80 mg total) by mouth 2 (two) times daily.  . Garlic 1000 MG CAPS Take 1,000 mg by mouth daily.  Marland Kitchen glimepiride (AMARYL) 4 MG tablet Take 4 mg by mouth 2 (two) times daily.  . hydrOXYzine (ATARAX/VISTARIL) 25 MG tablet Take 25 mg by mouth at bedtime.  Marland Kitchen ipratropium-albuterol (DUONEB) 0.5-2.5 (3) MG/3ML SOLN Inhale 3 mLs into the lungs every 4 (four) hours as needed (wheezing).  Marland Kitchen LANTUS SOLOSTAR 100 UNIT/ML Solostar Pen Inject 15 Units into the skin at bedtime.  Marland Kitchen losartan (COZAAR) 100 MG tablet Take 1 tablet by mouth daily.  . metFORMIN (GLUCOPHAGE) 500 MG tablet Take 500 mg by mouth 2 (two) times daily with a meal.  . metoprolol succinate (TOPROL-XL) 100 MG 24 hr tablet Take 1 tablet (100 mg total) by mouth daily. Take with or  immediately following a meal.  . montelukast (SINGULAIR) 10 MG tablet Take 10 mg by mouth at bedtime.  . Multiple Vitamin (MULTIVITAMIN WITH MINERALS) TABS tablet Take 1 tablet by mouth daily.  . nitroGLYCERIN (NITROSTAT) 0.4 MG SL tablet Place 1 tablet (0.4 mg total) under the tongue every 5 (five) minutes as needed for chest pain.  . Omega-3 Fatty Acids (FISH OIL) 1000 MG CAPS Take 1,000 mg by mouth 2 (two) times daily.   . OXYGEN Inhale 2 L into the lungs continuous.  . polyethylene glycol (MIRALAX / GLYCOLAX) 17 g packet Take 17 g by mouth as needed for mild constipation.  . potassium chloride SA (KLOR-CON) 20 MEQ tablet Take 20 mEq by mouth daily.  . Probiotic Product (PROBIOTIC PO) Take 1 tablet by mouth daily in the afternoon.  . rosuvastatin (CRESTOR) 40 MG tablet Take 40 mg by mouth at bedtime.  . Turmeric 500 MG CAPS Take 1 capsule by mouth daily.  . VENTOLIN HFA 108 (90 Base) MCG/ACT inhaler Inhale 2 puffs into the lungs every 6 (six) hours as needed for wheezing or shortness of breath.    No facility-administered encounter medications on file as of 03/22/2020.    Functional Status:  In your present state of health, do you have any difficulty performing the following activities: 03/15/2020 03/08/2020  Hearing? N N  Vision? N N  Difficulty concentrating or making decisions? N N  Walking or climbing stairs? Y Y  Comment shortness of breath -  Dressing or bathing? [name]  Comment has aide -  Doing errands, shopping? [name]  Comment family assists -  Quarry manager and eating ? Y -  Comment aide and family help -  Using the Toilet? N -  In the past six months, have you accidently leaked urine? N -  Do you have problems with loss of bowel control? N -  Managing your Medications? N -  Managing your Finances? N -  Housekeeping or managing your Housekeeping? Y -  Comment family helps -  Some recent data might be hidden    Fall/Depression Screening: Fall Risk  03/15/2020 02/08/2017  12/28/2016  Falls in the past year? 0 No No  Injury with Fall? - No -  Risk for fall due to : - Medication side effect -  Risk for fall due to: Comment - - -   PHQ 2/9 Scores 03/15/2020 12/28/2016 08/18/2016 04/25/2016 10/12/2015 09/29/2015  PHQ - 2 Score 0 0 0 0 0 0    Assessment:  Goals Addressed            This Visit's Progress   . Make and Keep All Appointments   On track    Timeframe:  Long-Range Goal Priority:  High Start Date:    03/15/20                         Expected End Date:     10/01/20                  Follow Up Date 04/01/20   - call to cancel if needed    Why is this important?    Part of staying healthy is seeing the doctor for follow-up care.   If you forget your appointments, there are some things you can do to stay on track.    Notes: 03/15/20 daughter takes to appointments.      . Track and Manage Fluids and Swelling-Heart Failure   On track    Timeframe:  Short-Term Goal Priority:  High Start Date:    03/15/20                     Expected End Date:  05/31/20                     Follow Up Date 04/01/20   - keep legs up while sitting - track weight in diary    Why is this important?    It is important to check your weight daily and watch how much salt and liquids you have.   It will help you to manage your heart failure.    Notes: 03/15/20 Discussed importance of hart failure regimen.  03/22/20 Keep up the great work!    . Track and Manage Symptoms-Heart Failure   On track    Timeframe:  Long-Range Goal Priority:  High Start Date:    03/15/20                         Expected End Date: 10/01/20                       Follow Up Date 04/01/20    - follow rescue plan if symptoms flare-up - know when to call the doctor    Why is this important?  You will be able to handle your symptoms better if you keep track of them.   Making some simple changes to your lifestyle will help.   Eating healthy is one thing you can do to take good care of  yourself.    Notes: 03/15/20 Discussed with patient heart failure zones and rescue plan.  03/22/20 Continue to monitor for signs of heart failure.       Plan: RN CM will contact patient next week. Follow-up:  Patient agrees to Care Plan and Follow-up.   Bary Leriche, RN, MSN [facility] Care Management Care Management Coordinator Direct Line [phone] Cell [phone] Toll Free: [phone]  Fax: [phone]

## 2020-03-24 DIAGNOSIS — I5033 Acute on chronic diastolic (congestive) heart failure: Secondary | ICD-10-CM | POA: Diagnosis not present

## 2020-03-24 DIAGNOSIS — E7800 Pure hypercholesterolemia, unspecified: Secondary | ICD-10-CM | POA: Diagnosis not present

## 2020-03-24 DIAGNOSIS — I130 Hypertensive heart and chronic kidney disease with heart failure and stage 1 through stage 4 chronic kidney disease, or unspecified chronic kidney disease: Secondary | ICD-10-CM | POA: Diagnosis not present

## 2020-03-24 DIAGNOSIS — J449 Chronic obstructive pulmonary disease, unspecified: Secondary | ICD-10-CM | POA: Diagnosis not present

## 2020-03-24 DIAGNOSIS — I2721 Secondary pulmonary arterial hypertension: Secondary | ICD-10-CM | POA: Diagnosis not present

## 2020-03-24 DIAGNOSIS — I421 Obstructive hypertrophic cardiomyopathy: Secondary | ICD-10-CM | POA: Diagnosis not present

## 2020-03-24 DIAGNOSIS — N182 Chronic kidney disease, stage 2 (mild): Secondary | ICD-10-CM | POA: Diagnosis not present

## 2020-03-24 DIAGNOSIS — E1122 Type 2 diabetes mellitus with diabetic chronic kidney disease: Secondary | ICD-10-CM | POA: Diagnosis not present

## 2020-03-24 DIAGNOSIS — G4733 Obstructive sleep apnea (adult) (pediatric): Secondary | ICD-10-CM | POA: Diagnosis not present

## 2020-03-26 DIAGNOSIS — I5033 Acute on chronic diastolic (congestive) heart failure: Secondary | ICD-10-CM | POA: Diagnosis not present

## 2020-03-26 DIAGNOSIS — E7800 Pure hypercholesterolemia, unspecified: Secondary | ICD-10-CM | POA: Diagnosis not present

## 2020-03-26 DIAGNOSIS — I2721 Secondary pulmonary arterial hypertension: Secondary | ICD-10-CM | POA: Diagnosis not present

## 2020-03-26 DIAGNOSIS — J449 Chronic obstructive pulmonary disease, unspecified: Secondary | ICD-10-CM | POA: Diagnosis not present

## 2020-03-26 DIAGNOSIS — E1122 Type 2 diabetes mellitus with diabetic chronic kidney disease: Secondary | ICD-10-CM | POA: Diagnosis not present

## 2020-03-26 DIAGNOSIS — N182 Chronic kidney disease, stage 2 (mild): Secondary | ICD-10-CM | POA: Diagnosis not present

## 2020-03-26 DIAGNOSIS — G4733 Obstructive sleep apnea (adult) (pediatric): Secondary | ICD-10-CM | POA: Diagnosis not present

## 2020-03-26 DIAGNOSIS — I421 Obstructive hypertrophic cardiomyopathy: Secondary | ICD-10-CM | POA: Diagnosis not present

## 2020-03-26 DIAGNOSIS — I130 Hypertensive heart and chronic kidney disease with heart failure and stage 1 through stage 4 chronic kidney disease, or unspecified chronic kidney disease: Secondary | ICD-10-CM | POA: Diagnosis not present

## 2020-03-27 DIAGNOSIS — J449 Chronic obstructive pulmonary disease, unspecified: Secondary | ICD-10-CM | POA: Diagnosis not present

## 2020-03-29 ENCOUNTER — Other Ambulatory Visit: Payer: Self-pay

## 2020-03-29 DIAGNOSIS — I421 Obstructive hypertrophic cardiomyopathy: Secondary | ICD-10-CM | POA: Diagnosis not present

## 2020-03-29 DIAGNOSIS — J449 Chronic obstructive pulmonary disease, unspecified: Secondary | ICD-10-CM | POA: Diagnosis not present

## 2020-03-29 DIAGNOSIS — N182 Chronic kidney disease, stage 2 (mild): Secondary | ICD-10-CM | POA: Diagnosis not present

## 2020-03-29 DIAGNOSIS — G4733 Obstructive sleep apnea (adult) (pediatric): Secondary | ICD-10-CM | POA: Diagnosis not present

## 2020-03-29 DIAGNOSIS — I2721 Secondary pulmonary arterial hypertension: Secondary | ICD-10-CM | POA: Diagnosis not present

## 2020-03-29 DIAGNOSIS — I130 Hypertensive heart and chronic kidney disease with heart failure and stage 1 through stage 4 chronic kidney disease, or unspecified chronic kidney disease: Secondary | ICD-10-CM | POA: Diagnosis not present

## 2020-03-29 DIAGNOSIS — E1122 Type 2 diabetes mellitus with diabetic chronic kidney disease: Secondary | ICD-10-CM | POA: Diagnosis not present

## 2020-03-29 DIAGNOSIS — E7800 Pure hypercholesterolemia, unspecified: Secondary | ICD-10-CM | POA: Diagnosis not present

## 2020-03-29 DIAGNOSIS — I5033 Acute on chronic diastolic (congestive) heart failure: Secondary | ICD-10-CM | POA: Diagnosis not present

## 2020-03-29 NOTE — Patient Outreach (Signed)
[facility]) Care Management  [facility] Care Manager  03/29/2020   Morgan Fischer February 17, 1950 [id number]  Subjective: Telephone call to patient for follow up.Patient doing well. Weight down to 287.1 lbs.  Encouraged to continue regimen for heart failure.  She verbalized understanding.    Objective:   Encounter Medications:  Outpatient Encounter Medications as of 03/29/2020  Medication Sig  . ACCU-CHEK AVIVA PLUS test strip 1 each 4 (four) times daily.  Marland Kitchen allopurinol (ZYLOPRIM) 300 MG tablet Take 300 mg by mouth daily.  Marland Kitchen aspirin EC 81 MG EC tablet Take 1 tablet (81 mg total) by mouth daily.  Marland Kitchen BREO ELLIPTA 200-25 MCG/INH AEPB Inhale 1 puff into the lungs daily.  . Cholecalciferol 50 MCG (2000 UT) CAPS Take 2,000 Units by mouth daily.  . Cyanocobalamin (VITAMIN B-12 PO) Take 1 tablet by mouth daily.  . diclofenac Sodium (VOLTAREN) 1 % GEL Apply 1 application topically as needed (pain).  Marland Kitchen disopyramide (NORPACE CR) 100 MG 12 hr capsule Take 2 capsules (200 mg total) by mouth every 12 (twelve) hours.  . ferrous sulfate 325 (65 FE) MG EC tablet Take 1 tablet by mouth daily.  . furosemide (LASIX) 80 MG tablet Take 1 tablet (80 mg total) by mouth 2 (two) times daily.  . Garlic 1000 MG CAPS Take 1,000 mg by mouth daily.  Marland Kitchen glimepiride (AMARYL) 4 MG tablet Take 4 mg by mouth 2 (two) times daily.  . hydrOXYzine (ATARAX/VISTARIL) 25 MG tablet Take 25 mg by mouth at bedtime.  Marland Kitchen ipratropium-albuterol (DUONEB) 0.5-2.5 (3) MG/3ML SOLN Inhale 3 mLs into the lungs every 4 (four) hours as needed (wheezing).  Marland Kitchen LANTUS SOLOSTAR 100 UNIT/ML Solostar Pen Inject 15 Units into the skin at bedtime.  Marland Kitchen losartan (COZAAR) 100 MG tablet Take 1 tablet by mouth daily.  . metFORMIN (GLUCOPHAGE) 500 MG tablet Take 500 mg by mouth 2 (two) times daily with a meal.  . metoprolol succinate (TOPROL-XL) 100 MG 24 hr tablet Take 1 tablet (100 mg total) by mouth daily. Take with or immediately following a meal.  .  montelukast (SINGULAIR) 10 MG tablet Take 10 mg by mouth at bedtime.  . Multiple Vitamin (MULTIVITAMIN WITH MINERALS) TABS tablet Take 1 tablet by mouth daily.  . nitroGLYCERIN (NITROSTAT) 0.4 MG SL tablet Place 1 tablet (0.4 mg total) under the tongue every 5 (five) minutes as needed for chest pain.  . Omega-3 Fatty Acids (FISH OIL) 1000 MG CAPS Take 1,000 mg by mouth 2 (two) times daily.   . OXYGEN Inhale 2 L into the lungs continuous.  . polyethylene glycol (MIRALAX / GLYCOLAX) 17 g packet Take 17 g by mouth as needed for mild constipation.  . potassium chloride SA (KLOR-CON) 20 MEQ tablet Take 20 mEq by mouth daily.  . Probiotic Product (PROBIOTIC PO) Take 1 tablet by mouth daily in the afternoon.  . rosuvastatin (CRESTOR) 40 MG tablet Take 40 mg by mouth at bedtime.  . Turmeric 500 MG CAPS Take 1 capsule by mouth daily.  . VENTOLIN HFA 108 (90 Base) MCG/ACT inhaler Inhale 2 puffs into the lungs every 6 (six) hours as needed for wheezing or shortness of breath.    No facility-administered encounter medications on file as of 03/29/2020.    Functional Status:  In your present state of health, do you have any difficulty performing the following activities: 03/15/2020 03/08/2020  Hearing? N N  Vision? N N  Difficulty concentrating or making decisions? N N  Walking or climbing  stairs? Y Y  Comment shortness of breath -  Dressing or bathing? [name]  Comment has aide -  Doing errands, shopping? [name]  Comment family assists -  Quarry manager and eating ? Y -  Comment aide and family help -  Using the Toilet? N -  In the past six months, have you accidently leaked urine? N -  Do you have problems with loss of bowel control? N -  Managing your Medications? N -  Managing your Finances? N -  Housekeeping or managing your Housekeeping? Y -  Comment family helps -  Some recent data might be hidden    Fall/Depression Screening: Fall Risk  03/15/2020 02/08/2017 12/28/2016  Falls in the past year? 0  No No  Injury with Fall? - No -  Risk for fall due to : - Medication side effect -  Risk for fall due to: Comment - - -   PHQ 2/9 Scores 03/15/2020 12/28/2016 08/18/2016 04/25/2016 10/12/2015 09/29/2015  PHQ - 2 Score 0 0 0 0 0 0    Assessment:  Goals Addressed            This Visit's Progress   . Make and Keep All Appointments   On track    Timeframe:  Long-Range Goal Priority:  High Start Date:    03/15/20                         Expected End Date:     10/01/20                  Follow Up Date 05/01/20   - arrange a ride through an agency 1 week before appointment    Why is this important?    Part of staying healthy is seeing the doctor for follow-up care.   If you forget your appointments, there are some things you can do to stay on track.    Notes: 03/15/20 daughter takes to appointments.      . Track and Manage Fluids and Swelling-Heart Failure   On track    Timeframe:  Short-Term Goal Priority:  High Start Date:    03/15/20                     Expected End Date:  05/31/20                     Follow Up Date 05/01/20   - use salt in moderation - watch for swelling in feet, ankles and legs every day - weigh myself daily    Why is this important?    It is important to check your weight daily and watch how much salt and liquids you have.   It will help you to manage your heart failure.    Notes: 03/15/20 Discussed importance of hart failure regimen.  03/22/20 Keep up the great work! 03/29/20 Keep with current regimen.    . Track and Manage Symptoms-Heart Failure   On track    Timeframe:  Long-Range Goal Priority:  High Start Date:    03/15/20                         Expected End Date: 10/01/20                       Follow Up Date 05/01/20    - follow rescue plan if  symptoms flare-up - track symptoms and what helps feel better or worse    Why is this important?    You will be able to handle your symptoms better if you keep track of them.   Making some simple  changes to your lifestyle will help.   Eating healthy is one thing you can do to take good care of yourself.    Notes: 03/15/20 Discussed with patient heart failure zones and rescue plan.  03/22/20 Continue to monitor for signs of heart failure.       Plan: RN CM will follow up next month. Follow-up:  Patient agrees to Care Plan and Follow-up.   Bary Leriche, RN, MSN [facility] Care Management Care Management Coordinator Direct Line [phone] Cell [phone] Toll Free: [phone]  Fax: [phone]

## 2020-03-31 NOTE — Telephone Encounter (Signed)
How do you advise? Thank you for your help. Dala Dock

## 2020-04-01 DIAGNOSIS — I5033 Acute on chronic diastolic (congestive) heart failure: Secondary | ICD-10-CM | POA: Diagnosis not present

## 2020-04-01 DIAGNOSIS — I130 Hypertensive heart and chronic kidney disease with heart failure and stage 1 through stage 4 chronic kidney disease, or unspecified chronic kidney disease: Secondary | ICD-10-CM | POA: Diagnosis not present

## 2020-04-01 DIAGNOSIS — J449 Chronic obstructive pulmonary disease, unspecified: Secondary | ICD-10-CM | POA: Diagnosis not present

## 2020-04-01 DIAGNOSIS — E1122 Type 2 diabetes mellitus with diabetic chronic kidney disease: Secondary | ICD-10-CM | POA: Diagnosis not present

## 2020-04-01 DIAGNOSIS — E7800 Pure hypercholesterolemia, unspecified: Secondary | ICD-10-CM | POA: Diagnosis not present

## 2020-04-01 DIAGNOSIS — G4733 Obstructive sleep apnea (adult) (pediatric): Secondary | ICD-10-CM | POA: Diagnosis not present

## 2020-04-01 DIAGNOSIS — N182 Chronic kidney disease, stage 2 (mild): Secondary | ICD-10-CM | POA: Diagnosis not present

## 2020-04-01 DIAGNOSIS — I421 Obstructive hypertrophic cardiomyopathy: Secondary | ICD-10-CM | POA: Diagnosis not present

## 2020-04-01 DIAGNOSIS — I2721 Secondary pulmonary arterial hypertension: Secondary | ICD-10-CM | POA: Diagnosis not present

## 2020-04-05 NOTE — Telephone Encounter (Signed)
I have attempted to help this pt with a PA for the medication but do not know the answers to the questions. Hoping someone here could assist.

## 2020-04-06 DIAGNOSIS — I130 Hypertensive heart and chronic kidney disease with heart failure and stage 1 through stage 4 chronic kidney disease, or unspecified chronic kidney disease: Secondary | ICD-10-CM | POA: Diagnosis not present

## 2020-04-06 DIAGNOSIS — I5033 Acute on chronic diastolic (congestive) heart failure: Secondary | ICD-10-CM | POA: Diagnosis not present

## 2020-04-06 DIAGNOSIS — I2721 Secondary pulmonary arterial hypertension: Secondary | ICD-10-CM | POA: Diagnosis not present

## 2020-04-06 DIAGNOSIS — J449 Chronic obstructive pulmonary disease, unspecified: Secondary | ICD-10-CM | POA: Diagnosis not present

## 2020-04-06 DIAGNOSIS — E7800 Pure hypercholesterolemia, unspecified: Secondary | ICD-10-CM | POA: Diagnosis not present

## 2020-04-06 DIAGNOSIS — N182 Chronic kidney disease, stage 2 (mild): Secondary | ICD-10-CM | POA: Diagnosis not present

## 2020-04-06 DIAGNOSIS — I421 Obstructive hypertrophic cardiomyopathy: Secondary | ICD-10-CM | POA: Diagnosis not present

## 2020-04-06 DIAGNOSIS — G4733 Obstructive sleep apnea (adult) (pediatric): Secondary | ICD-10-CM | POA: Diagnosis not present

## 2020-04-06 DIAGNOSIS — E1122 Type 2 diabetes mellitus with diabetic chronic kidney disease: Secondary | ICD-10-CM | POA: Diagnosis not present

## 2020-04-08 DIAGNOSIS — I2721 Secondary pulmonary arterial hypertension: Secondary | ICD-10-CM | POA: Diagnosis not present

## 2020-04-08 DIAGNOSIS — I130 Hypertensive heart and chronic kidney disease with heart failure and stage 1 through stage 4 chronic kidney disease, or unspecified chronic kidney disease: Secondary | ICD-10-CM | POA: Diagnosis not present

## 2020-04-08 DIAGNOSIS — E7800 Pure hypercholesterolemia, unspecified: Secondary | ICD-10-CM | POA: Diagnosis not present

## 2020-04-08 DIAGNOSIS — I5033 Acute on chronic diastolic (congestive) heart failure: Secondary | ICD-10-CM | POA: Diagnosis not present

## 2020-04-08 DIAGNOSIS — G4733 Obstructive sleep apnea (adult) (pediatric): Secondary | ICD-10-CM | POA: Diagnosis not present

## 2020-04-08 DIAGNOSIS — J449 Chronic obstructive pulmonary disease, unspecified: Secondary | ICD-10-CM | POA: Diagnosis not present

## 2020-04-08 DIAGNOSIS — I421 Obstructive hypertrophic cardiomyopathy: Secondary | ICD-10-CM | POA: Diagnosis not present

## 2020-04-08 DIAGNOSIS — E1122 Type 2 diabetes mellitus with diabetic chronic kidney disease: Secondary | ICD-10-CM | POA: Diagnosis not present

## 2020-04-08 DIAGNOSIS — N182 Chronic kidney disease, stage 2 (mild): Secondary | ICD-10-CM | POA: Diagnosis not present

## 2020-04-08 MED ORDER — DISOPYRAMIDE PHOSPHATE ER 100 MG PO CP12: 200.0000 mg | ORAL_CAPSULE | Freq: Two times a day (BID) | ORAL | 2 refills | Status: DC

## 2020-04-08 NOTE — Addendum Note (Signed)
Addended by: Eleonore Chiquito on: 04/08/2020 01:33 PM   Modules accepted: Orders

## 2020-04-12 DIAGNOSIS — J441 Chronic obstructive pulmonary disease with (acute) exacerbation: Secondary | ICD-10-CM | POA: Diagnosis not present

## 2020-04-12 DIAGNOSIS — I10 Essential (primary) hypertension: Secondary | ICD-10-CM | POA: Diagnosis not present

## 2020-04-12 DIAGNOSIS — E1100 Type 2 diabetes mellitus with hyperosmolarity without nonketotic hyperglycemic-hyperosmolar coma (NKHHC): Secondary | ICD-10-CM | POA: Diagnosis not present

## 2020-04-12 DIAGNOSIS — I422 Other hypertrophic cardiomyopathy: Secondary | ICD-10-CM

## 2020-04-12 DIAGNOSIS — E119 Type 2 diabetes mellitus without complications: Secondary | ICD-10-CM | POA: Insufficient documentation

## 2020-04-12 HISTORY — DX: Other hypertrophic cardiomyopathy: I42.2

## 2020-04-12 HISTORY — DX: Type 2 diabetes mellitus without complications: E11.9

## 2020-04-15 DIAGNOSIS — E7800 Pure hypercholesterolemia, unspecified: Secondary | ICD-10-CM | POA: Diagnosis not present

## 2020-04-15 DIAGNOSIS — N182 Chronic kidney disease, stage 2 (mild): Secondary | ICD-10-CM | POA: Diagnosis not present

## 2020-04-15 DIAGNOSIS — J449 Chronic obstructive pulmonary disease, unspecified: Secondary | ICD-10-CM | POA: Diagnosis not present

## 2020-04-15 DIAGNOSIS — I421 Obstructive hypertrophic cardiomyopathy: Secondary | ICD-10-CM | POA: Diagnosis not present

## 2020-04-15 DIAGNOSIS — I2721 Secondary pulmonary arterial hypertension: Secondary | ICD-10-CM | POA: Diagnosis not present

## 2020-04-15 DIAGNOSIS — G4733 Obstructive sleep apnea (adult) (pediatric): Secondary | ICD-10-CM | POA: Diagnosis not present

## 2020-04-15 DIAGNOSIS — E1122 Type 2 diabetes mellitus with diabetic chronic kidney disease: Secondary | ICD-10-CM | POA: Diagnosis not present

## 2020-04-15 DIAGNOSIS — I5033 Acute on chronic diastolic (congestive) heart failure: Secondary | ICD-10-CM | POA: Diagnosis not present

## 2020-04-15 DIAGNOSIS — I130 Hypertensive heart and chronic kidney disease with heart failure and stage 1 through stage 4 chronic kidney disease, or unspecified chronic kidney disease: Secondary | ICD-10-CM | POA: Diagnosis not present

## 2020-04-20 ENCOUNTER — Other Ambulatory Visit: Payer: Self-pay

## 2020-04-20 NOTE — Patient Outreach (Signed)
[facility]) Care Management  [facility] Care Manager  04/20/2020   Morgan Fischer April 27, 1950 [id number]  Subjective: Telephone call to patient for follow up.  She states she is doing good.  Weight up to 290.1 lbs however.  Discussed heart failure management and when to notify physician for changes.  She denies any increased shortness of breath or swelling.  Patient continues to adhere fluid restriction.  Patient aide services continues without problems.    Objective:   Encounter Medications:  Outpatient Encounter Medications as of 04/20/2020  Medication Sig  . ACCU-CHEK AVIVA PLUS test strip 1 each 4 (four) times daily.  Marland Kitchen allopurinol (ZYLOPRIM) 300 MG tablet Take 300 mg by mouth daily.  Marland Kitchen aspirin EC 81 MG EC tablet Take 1 tablet (81 mg total) by mouth daily.  Marland Kitchen BREO ELLIPTA 200-25 MCG/INH AEPB Inhale 1 puff into the lungs daily.  . Cholecalciferol 50 MCG (2000 UT) CAPS Take 2,000 Units by mouth daily.  . Cyanocobalamin (VITAMIN B-12 PO) Take 1 tablet by mouth daily.  . diclofenac Sodium (VOLTAREN) 1 % GEL Apply 1 application topically as needed (pain).  Marland Kitchen disopyramide (NORPACE CR) 100 MG 12 hr capsule Take 2 capsules (200 mg total) by mouth every 12 (twelve) hours.  . ferrous sulfate 325 (65 FE) MG EC tablet Take 1 tablet by mouth daily.  . furosemide (LASIX) 80 MG tablet Take 1 tablet (80 mg total) by mouth 2 (two) times daily.  . Garlic 1000 MG CAPS Take 1,000 mg by mouth daily.  Marland Kitchen glimepiride (AMARYL) 4 MG tablet Take 4 mg by mouth 2 (two) times daily.  . hydrOXYzine (ATARAX/VISTARIL) 25 MG tablet Take 25 mg by mouth at bedtime.  Marland Kitchen ipratropium-albuterol (DUONEB) 0.5-2.5 (3) MG/3ML SOLN Inhale 3 mLs into the lungs every 4 (four) hours as needed (wheezing).  Marland Kitchen LANTUS SOLOSTAR 100 UNIT/ML Solostar Pen Inject 15 Units into the skin at bedtime.  Marland Kitchen losartan (COZAAR) 100 MG tablet Take 1 tablet by mouth daily.  . metFORMIN (GLUCOPHAGE) 500 MG tablet Take 500 mg by mouth 2 (two)  times daily with a meal.  . metoprolol succinate (TOPROL-XL) 100 MG 24 hr tablet Take 1 tablet (100 mg total) by mouth daily. Take with or immediately following a meal.  . montelukast (SINGULAIR) 10 MG tablet Take 10 mg by mouth at bedtime.  . Multiple Vitamin (MULTIVITAMIN WITH MINERALS) TABS tablet Take 1 tablet by mouth daily.  . nitroGLYCERIN (NITROSTAT) 0.4 MG SL tablet Place 1 tablet (0.4 mg total) under the tongue every 5 (five) minutes as needed for chest pain.  . Omega-3 Fatty Acids (FISH OIL) 1000 MG CAPS Take 1,000 mg by mouth 2 (two) times daily.   . OXYGEN Inhale 2 L into the lungs continuous.  . polyethylene glycol (MIRALAX / GLYCOLAX) 17 g packet Take 17 g by mouth as needed for mild constipation.  . potassium chloride SA (KLOR-CON) 20 MEQ tablet Take 20 mEq by mouth daily.  . Probiotic Product (PROBIOTIC PO) Take 1 tablet by mouth daily in the afternoon.  . rosuvastatin (CRESTOR) 40 MG tablet Take 40 mg by mouth at bedtime.  . Turmeric 500 MG CAPS Take 1 capsule by mouth daily.  . VENTOLIN HFA 108 (90 Base) MCG/ACT inhaler Inhale 2 puffs into the lungs every 6 (six) hours as needed for wheezing or shortness of breath.    No facility-administered encounter medications on file as of 04/20/2020.    Functional Status:  In your present state of health,  do you have any difficulty performing the following activities: 03/15/2020 03/08/2020  Hearing? N N  Vision? N N  Difficulty concentrating or making decisions? N N  Walking or climbing stairs? Y Y  Comment shortness of breath -  Dressing or bathing? Morgan Fischer  Comment has aide -  Doing errands, shopping? Morgan Fischer  Comment family assists -  Quarry manager and eating ? Y -  Comment aide and family help -  Using the Toilet? N -  In the past six months, have you accidently leaked urine? N -  Do you have problems with loss of bowel control? N -  Managing your Medications? N -  Managing your Finances? N -  Housekeeping or managing your  Housekeeping? Y -  Comment family helps -  Some recent data might be hidden    Fall/Depression Screening: Fall Risk  03/15/2020 02/08/2017 12/28/2016  Falls in the past year? 0 No No  Injury with Fall? - No -  Risk for fall due to : - Medication side effect -  Risk for fall due to: Comment - - -   PHQ 2/9 Scores 03/15/2020 12/28/2016 08/18/2016 04/25/2016 10/12/2015 09/29/2015  PHQ - 2 Score 0 0 0 0 0 0    Assessment:  Goals Addressed            This Visit's Progress   . Make and Keep All Appointments   On track    Timeframe:  Long-Range Goal Priority:  High Start Date:    03/15/20                         Expected End Date:     10/01/20                  Follow Up Date 06/01/20   - ask family or friend for a ride - keep a calendar with appointment dates    Why is this important?    Part of staying healthy is seeing the doctor for follow-up care.   If you forget your appointments, there are some things you can do to stay on track.    Notes: 03/15/20 daughter takes to appointments.   04/20/20 Patient sees physician regularly.     . COMPLETED: Track and Manage Fluids and Swelling-Heart Failure   On track    Timeframe:  Short-Term Goal Priority:  High Start Date:    03/15/20                     Expected End Date:  05/31/20                     Follow Up Date 05/01/20  - call office if I gain more than 2 pounds in one day or 5 pounds in one week    Why is this important?    It is important to check your weight daily and watch how much salt and liquids you have.   It will help you to manage your heart failure.    Notes: 03/15/20 Discussed importance of hart failure regimen.  03/22/20 Keep up the great work! 03/29/20 Keep with current regimen. 04/20/20 Reiterated with patient heart failure regimen.      . Track and Manage Symptoms-Heart Failure   On track    Timeframe:  Long-Range Goal Priority:  High Start Date:    03/15/20  Expected End Date: 10/01/20                        Follow Up Date 05/31/20    - follow rescue plan if symptoms flare-up - know when to call the doctor    Why is this important?    You will be able to handle your symptoms better if you keep track of them.   Making some simple changes to your lifestyle will help.   Eating healthy is one thing you can do to take good care of yourself.    Notes: 03/15/20 Discussed with patient heart failure zones and rescue plan.  03/22/20 Continue to monitor for signs of heart failure. 04/20/20 Reiterated with patient signs of heart failure and mangement.       Plan: RN CM will follow up next month. Follow-up:  Patient agrees to Care Plan and Follow-up.   Bary Leriche, RN, MSN [facility] Care Management Care Management Coordinator Direct Line [phone] Cell [phone] Toll Free: [phone]  Fax: [phone]

## 2020-04-21 DIAGNOSIS — I2781 Cor pulmonale (chronic): Secondary | ICD-10-CM | POA: Diagnosis not present

## 2020-04-21 DIAGNOSIS — R0602 Shortness of breath: Secondary | ICD-10-CM | POA: Diagnosis not present

## 2020-04-23 DIAGNOSIS — G4733 Obstructive sleep apnea (adult) (pediatric): Secondary | ICD-10-CM | POA: Diagnosis not present

## 2020-04-23 DIAGNOSIS — I130 Hypertensive heart and chronic kidney disease with heart failure and stage 1 through stage 4 chronic kidney disease, or unspecified chronic kidney disease: Secondary | ICD-10-CM | POA: Diagnosis not present

## 2020-04-23 DIAGNOSIS — E1122 Type 2 diabetes mellitus with diabetic chronic kidney disease: Secondary | ICD-10-CM | POA: Diagnosis not present

## 2020-04-23 DIAGNOSIS — I421 Obstructive hypertrophic cardiomyopathy: Secondary | ICD-10-CM | POA: Diagnosis not present

## 2020-04-23 DIAGNOSIS — J449 Chronic obstructive pulmonary disease, unspecified: Secondary | ICD-10-CM | POA: Diagnosis not present

## 2020-04-23 DIAGNOSIS — N182 Chronic kidney disease, stage 2 (mild): Secondary | ICD-10-CM | POA: Diagnosis not present

## 2020-04-23 DIAGNOSIS — E7800 Pure hypercholesterolemia, unspecified: Secondary | ICD-10-CM | POA: Diagnosis not present

## 2020-04-23 DIAGNOSIS — I2721 Secondary pulmonary arterial hypertension: Secondary | ICD-10-CM | POA: Diagnosis not present

## 2020-04-23 DIAGNOSIS — I5033 Acute on chronic diastolic (congestive) heart failure: Secondary | ICD-10-CM | POA: Diagnosis not present

## 2020-04-26 ENCOUNTER — Other Ambulatory Visit: Payer: Self-pay

## 2020-04-26 ENCOUNTER — Encounter: Payer: Medicare HMO | Attending: Internal Medicine | Admitting: Registered"

## 2020-04-26 ENCOUNTER — Encounter: Payer: Self-pay | Admitting: Registered"

## 2020-04-26 DIAGNOSIS — E119 Type 2 diabetes mellitus without complications: Secondary | ICD-10-CM | POA: Diagnosis not present

## 2020-04-26 NOTE — Progress Notes (Signed)
Diabetes Self-Management Education  Visit Type: First/Initial  Appt. Start Time: 1100 Appt. End Time: 1225  04/26/2020  [name], identified by name and date of birth, is a 70 y.o. female with a diagnosis of Diabetes: Type 2.   Caregiver present at visit.  ASSESSMENT  There were no vitals taken for this visit. There is no height or weight on file to calculate BMI.    DM Medication: Glimepiride bid, Lantus 15 u bedtime, metformin 500 bid  Problem list. HTN, COPD, OSA T2DM, CKD. History of gout, iron def  Vitamins, supplements: b-12, iron, garlic, tumeric, MVI, fish oil 1000 bid; vit D 2000 units  Patient states she is most concerned now due to recent events with her heart. Pt states she is following a low sodium diet. Pt states she has tried the DASH diet in the past but thought it was difficult and did not lose weight.  Pt states she has been on and off insulin and would like to be able to discontinue it. Pt reports hypoglycemic symptoms 65-70 mg/dL, uses rule of 15 to treat.  Sleep: pt states she stays up late, 4 hrs wakes up in middle of night, sleep has been worse last couple of years, but has had disrupted sleep cycle for many years. 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  Daily routine. ~8 am gets up and dressed, caregiver arrives around 9 am and makes breakfast, 11:30 exercises. Lunch 1 pm, watches TV or does paperwork; sometimes takes 60-90 min nap Dinner 7 or 8, stays up watching TV, sometimes reads on tablet, until 12 or 1 am  Next visit: ideas for simple meals when caregiver not available to cook meals and website for recipe ideas, read labels for [employer] honey wheat and low sodium [employer] [place], cheese sandwich.   Diabetes Self-Management Education - 04/26/20 1118      Visit Information   Visit Type First/Initial      Initial Visit   Diabetes Type Type 2     Are you currently following a meal plan? Yes    What type of meal plan do you follow? low sodium, heart healthy    Are you taking your medications as prescribed? Yes    Date Diagnosed 1993      Health Coping   How would you rate your overall health? Good      Psychosocial Assessment   Patient Belief/Attitude about Diabetes Motivated to manage diabetes    How often do you need to have someone help you when you read instructions, pamphlets, or other written materials from your doctor or pharmacy? 1 - Never    What is the last grade level you completed in school? college graduate      Complications   Last HgB A1C per patient/outside source 8.4 %    How often do you check your blood sugar? 1-2 times/day    Fasting Blood glucose range (mg/dL) 29-518   84-166   Number of hypoglycemic episodes per month 1    Have you had a dilated eye exam in the past 12 months? No    Have you had a dental exam in the past 12 months? No    Are you checking your feet? Yes    How many days per week are you checking your feet? 4      Dietary Intake   Breakfast oatmeal, milk,  fruit OR grits, eggs, 2 pieces low sodium bacon OR oatmeal    Snack (morning) none OR apple OR canned fruit in own juice or light    Lunch [place] sandwich, lettuce tomato mayo maybe with a fruit (9 grapes)    Snack (afternoon) none    Dinner baked chicken or pork chop, collard greens (frozen, no seasoning)y, brown or wild rice ~1 c    Snack (evening) none    Beverage(s) (fluid restriction 1.5 liters) decaf coffee with flavored creamer ~1.5 Tbs, diet ginger ale, water      Exercise   Exercise Type Light (walking / raking leaves)    How many days per week to you exercise? 5    How many minutes per day do you exercise? 15    Total minutes per week of exercise 75      Patient Education   Previous Diabetes Education Yes (please comment)   10 yrs ago   Nutrition management  Food label reading, portion sizes and measuring food.;Role of  diet in the treatment of diabetes and the relationship between the three main macronutrients and blood glucose level    Physical activity and exercise  Role of exercise on diabetes management, blood pressure control and cardiac health.    Monitoring Identified appropriate SMBG and/or A1C goals.    Chronic complications Dental care    Psychosocial adjustment Other (comment)   sleep     Individualized Goals (developed by patient)   Nutrition Other (comment);General guidelines for healthy choices and portions discussed   low sodium   Monitoring  test my blood glucose as discussed    Reducing Risk Other (comment)   improve sleep habits     Outcomes   Expected Outcomes Demonstrated interest in learning. Expect positive outcomes    Future DMSE 4-6 wks    Program Status Not Completed           Individualized Plan for Diabetes Self-Management Training:   Learning Objective:  Patient will have a greater understanding of diabetes self-management. Patient education plan is to attend individual and/or group sessions per assessed needs and concerns.    Patient Instructions  Sleep  Consider the sleep tips and attempt to retrain your body to sleep in the evening. Blood sugar control:  Continue aiming for 2-3 carbohydrates per meal and balance with protein and non-starchy vegetables.  Consider checking your blood sugar 1-2 hours after a meal and keeping a log book to take to all your medical appointments.  If interested in Continuous Glucose Monitor (CGM), call you insurance company to see if covered. If so, contact you doctor to get Rx. If you'd like help getting started you can schedule an appointment to start your CGM Low Sodium:  Use the DASH diet handout, being mindful of carb counting when following the servings of carb containing foods.  Use the Eating in the [employer] handout to find carb and sodium content of items at the restaurants that you rely on when you do not have a caregiver  to prepare meals.   Expected Outcomes:  Demonstrated interest in learning. Expect positive outcomes  Education material provided: Meal plan card, Planning Healthy Meals, sleep hygiene, low sodium tips, DASH diet brief  If problems or questions, patient to contact team via:  Phone  Future DSME appointment: 4-6 wks

## 2020-04-26 NOTE — Patient Instructions (Addendum)
Sleep  Consider the sleep tips and attempt to retrain your body to sleep in the evening. Blood sugar control:  Continue aiming for 2-3 carbohydrates per meal and balance with protein and non-starchy vegetables.  Consider checking your blood sugar 1-2 hours after a meal and keeping a log book to take to all your medical appointments.  If interested in Continuous Glucose Monitor (CGM), call you insurance company to see if covered. If so, contact you doctor to get Rx. If you'd like help getting started you can schedule an appointment to start your CGM Low Sodium:  Use the DASH diet handout, being mindful of carb counting when following the servings of carb containing foods.  Use the Eating in the [employer] handout to find carb and sodium content of items at the restaurants that you rely on when you do not have a caregiver to prepare meals.

## 2020-04-27 DIAGNOSIS — J449 Chronic obstructive pulmonary disease, unspecified: Secondary | ICD-10-CM | POA: Diagnosis not present

## 2020-04-28 DIAGNOSIS — N182 Chronic kidney disease, stage 2 (mild): Secondary | ICD-10-CM | POA: Diagnosis not present

## 2020-04-28 DIAGNOSIS — I421 Obstructive hypertrophic cardiomyopathy: Secondary | ICD-10-CM | POA: Diagnosis not present

## 2020-04-28 DIAGNOSIS — G4733 Obstructive sleep apnea (adult) (pediatric): Secondary | ICD-10-CM | POA: Diagnosis not present

## 2020-04-28 DIAGNOSIS — I130 Hypertensive heart and chronic kidney disease with heart failure and stage 1 through stage 4 chronic kidney disease, or unspecified chronic kidney disease: Secondary | ICD-10-CM | POA: Diagnosis not present

## 2020-04-28 DIAGNOSIS — E7800 Pure hypercholesterolemia, unspecified: Secondary | ICD-10-CM | POA: Diagnosis not present

## 2020-04-28 DIAGNOSIS — I2721 Secondary pulmonary arterial hypertension: Secondary | ICD-10-CM | POA: Diagnosis not present

## 2020-04-28 DIAGNOSIS — E1122 Type 2 diabetes mellitus with diabetic chronic kidney disease: Secondary | ICD-10-CM | POA: Diagnosis not present

## 2020-04-28 DIAGNOSIS — J449 Chronic obstructive pulmonary disease, unspecified: Secondary | ICD-10-CM | POA: Diagnosis not present

## 2020-04-28 DIAGNOSIS — I5033 Acute on chronic diastolic (congestive) heart failure: Secondary | ICD-10-CM | POA: Diagnosis not present

## 2020-05-03 DIAGNOSIS — G4733 Obstructive sleep apnea (adult) (pediatric): Secondary | ICD-10-CM | POA: Diagnosis not present

## 2020-05-03 DIAGNOSIS — I130 Hypertensive heart and chronic kidney disease with heart failure and stage 1 through stage 4 chronic kidney disease, or unspecified chronic kidney disease: Secondary | ICD-10-CM | POA: Diagnosis not present

## 2020-05-03 DIAGNOSIS — J449 Chronic obstructive pulmonary disease, unspecified: Secondary | ICD-10-CM | POA: Diagnosis not present

## 2020-05-03 DIAGNOSIS — I5033 Acute on chronic diastolic (congestive) heart failure: Secondary | ICD-10-CM | POA: Diagnosis not present

## 2020-05-03 DIAGNOSIS — N182 Chronic kidney disease, stage 2 (mild): Secondary | ICD-10-CM | POA: Diagnosis not present

## 2020-05-03 DIAGNOSIS — I421 Obstructive hypertrophic cardiomyopathy: Secondary | ICD-10-CM | POA: Diagnosis not present

## 2020-05-03 DIAGNOSIS — E7800 Pure hypercholesterolemia, unspecified: Secondary | ICD-10-CM | POA: Diagnosis not present

## 2020-05-03 DIAGNOSIS — I2721 Secondary pulmonary arterial hypertension: Secondary | ICD-10-CM | POA: Diagnosis not present

## 2020-05-03 DIAGNOSIS — E1122 Type 2 diabetes mellitus with diabetic chronic kidney disease: Secondary | ICD-10-CM | POA: Diagnosis not present

## 2020-05-13 DIAGNOSIS — G4733 Obstructive sleep apnea (adult) (pediatric): Secondary | ICD-10-CM | POA: Diagnosis not present

## 2020-05-13 DIAGNOSIS — J449 Chronic obstructive pulmonary disease, unspecified: Secondary | ICD-10-CM | POA: Diagnosis not present

## 2020-05-13 DIAGNOSIS — I5033 Acute on chronic diastolic (congestive) heart failure: Secondary | ICD-10-CM | POA: Diagnosis not present

## 2020-05-13 DIAGNOSIS — I2721 Secondary pulmonary arterial hypertension: Secondary | ICD-10-CM | POA: Diagnosis not present

## 2020-05-13 DIAGNOSIS — I421 Obstructive hypertrophic cardiomyopathy: Secondary | ICD-10-CM | POA: Diagnosis not present

## 2020-05-13 DIAGNOSIS — E7800 Pure hypercholesterolemia, unspecified: Secondary | ICD-10-CM | POA: Diagnosis not present

## 2020-05-13 DIAGNOSIS — N182 Chronic kidney disease, stage 2 (mild): Secondary | ICD-10-CM | POA: Diagnosis not present

## 2020-05-13 DIAGNOSIS — I130 Hypertensive heart and chronic kidney disease with heart failure and stage 1 through stage 4 chronic kidney disease, or unspecified chronic kidney disease: Secondary | ICD-10-CM | POA: Diagnosis not present

## 2020-05-13 DIAGNOSIS — E1122 Type 2 diabetes mellitus with diabetic chronic kidney disease: Secondary | ICD-10-CM | POA: Diagnosis not present

## 2020-05-18 DIAGNOSIS — J449 Chronic obstructive pulmonary disease, unspecified: Secondary | ICD-10-CM | POA: Diagnosis not present

## 2020-05-18 DIAGNOSIS — J45909 Unspecified asthma, uncomplicated: Secondary | ICD-10-CM | POA: Diagnosis not present

## 2020-05-19 DIAGNOSIS — M79672 Pain in left foot: Secondary | ICD-10-CM | POA: Diagnosis not present

## 2020-05-19 DIAGNOSIS — E1159 Type 2 diabetes mellitus with other circulatory complications: Secondary | ICD-10-CM | POA: Diagnosis not present

## 2020-05-19 DIAGNOSIS — M79671 Pain in right foot: Secondary | ICD-10-CM | POA: Diagnosis not present

## 2020-05-19 DIAGNOSIS — B351 Tinea unguium: Secondary | ICD-10-CM | POA: Diagnosis not present

## 2020-05-19 DIAGNOSIS — L84 Corns and callosities: Secondary | ICD-10-CM | POA: Diagnosis not present

## 2020-05-25 ENCOUNTER — Other Ambulatory Visit: Payer: Self-pay

## 2020-05-25 NOTE — Patient Instructions (Signed)
Goals Addressed            This Visit's Progress   . Make and Keep All Appointments   On track    Timeframe:  Long-Range Goal Priority:  High Start Date:    03/15/20                         Expected End Date:     10/01/20                  Follow Up Date 07/01/20   - ask family or friend for a ride - keep a calendar with appointment dates    Why is this important?    Part of staying healthy is seeing the doctor for follow-up care.   If you forget your appointments, there are some things you can do to stay on track.    Notes: 03/15/20 daughter takes to appointments.   04/20/20 Patient sees physician regularly.     . Track and Manage Symptoms-Heart Failure   On track    Timeframe:  Long-Range Goal Priority:  High Start Date:    03/15/20                         Expected End Date: 10/01/20                       Follow Up Date 07/01/20    - follow rescue plan if symptoms flare-up - know when to call the doctor    Why is this important?    You will be able to handle your symptoms better if you keep track of them.   Making some simple changes to your lifestyle will help.   Eating healthy is one thing you can do to take good care of yourself.    Notes: 03/15/20 Discussed with patient heart failure zones and rescue plan.  03/22/20 Continue to monitor for signs of heart failure. 04/20/20 Reiterated with patient signs of heart failure and mangement. 05/25/20 Reviewed signs of heart failure.

## 2020-05-25 NOTE — Patient Outreach (Signed)
[facility]) Care Management  [facility] Care Manager  05/25/2020   Morgan Fischer 11-02-1950 [id number]  Subjective: Telephone call to patient for follow up. Patient reports she is doing ok.  Discussed heart failure management.  She reports that her weight has been around 289 lbs. She continues with fluid restriction.  Encouraged patient to continue with heart failure management.   Objective:   Encounter Medications:  Outpatient Encounter Medications as of 05/25/2020  Medication Sig  . ACCU-CHEK AVIVA PLUS test strip 1 each 4 (four) times daily.  Marland Kitchen allopurinol (ZYLOPRIM) 300 MG tablet Take 300 mg by mouth daily.  Marland Kitchen aspirin EC 81 MG EC tablet Take 1 tablet (81 mg total) by mouth daily.  Marland Kitchen BREO ELLIPTA 200-25 MCG/INH AEPB Inhale 1 puff into the lungs daily.  . Cholecalciferol 50 MCG (2000 UT) CAPS Take 2,000 Units by mouth daily.  . Cyanocobalamin (VITAMIN B-12 PO) Take 1 tablet by mouth daily.  . diclofenac Sodium (VOLTAREN) 1 % GEL Apply 1 application topically as needed (pain).  Marland Kitchen disopyramide (NORPACE CR) 100 MG 12 hr capsule Take 2 capsules (200 mg total) by mouth every 12 (twelve) hours.  . ferrous sulfate 325 (65 FE) MG EC tablet Take 1 tablet by mouth daily.  . furosemide (LASIX) 80 MG tablet Take 1 tablet (80 mg total) by mouth 2 (two) times daily.  . Garlic 1000 MG CAPS Take 1,000 mg by mouth daily.  Marland Kitchen glimepiride (AMARYL) 4 MG tablet Take 4 mg by mouth 2 (two) times daily.  . hydrOXYzine (ATARAX/VISTARIL) 25 MG tablet Take 25 mg by mouth at bedtime.  Marland Kitchen ipratropium-albuterol (DUONEB) 0.5-2.5 (3) MG/3ML SOLN Inhale 3 mLs into the lungs every 4 (four) hours as needed (wheezing).  Marland Kitchen LANTUS SOLOSTAR 100 UNIT/ML Solostar Pen Inject 15 Units into the skin at bedtime.  Marland Kitchen losartan (COZAAR) 100 MG tablet Take 1 tablet by mouth daily.  . metFORMIN (GLUCOPHAGE) 500 MG tablet Take 500 mg by mouth 2 (two) times daily with a meal.  . metoprolol succinate (TOPROL-XL) 100 MG 24 hr  tablet Take 1 tablet (100 mg total) by mouth daily. Take with or immediately following a meal.  . montelukast (SINGULAIR) 10 MG tablet Take 10 mg by mouth at bedtime.  . Multiple Vitamin (MULTIVITAMIN WITH MINERALS) TABS tablet Take 1 tablet by mouth daily.  . nitroGLYCERIN (NITROSTAT) 0.4 MG SL tablet Place 1 tablet (0.4 mg total) under the tongue every 5 (five) minutes as needed for chest pain.  . Omega-3 Fatty Acids (FISH OIL) 1000 MG CAPS Take 1,000 mg by mouth 2 (two) times daily.   . OXYGEN Inhale 2 L into the lungs continuous.  . polyethylene glycol (MIRALAX / GLYCOLAX) 17 g packet Take 17 g by mouth as needed for mild constipation.  . potassium chloride SA (KLOR-CON) 20 MEQ tablet Take 20 mEq by mouth daily.  . Probiotic Product (PROBIOTIC PO) Take 1 tablet by mouth daily in the afternoon.  . rosuvastatin (CRESTOR) 40 MG tablet Take 40 mg by mouth at bedtime.  . Turmeric 500 MG CAPS Take 1 capsule by mouth daily.  . VENTOLIN HFA 108 (90 Base) MCG/ACT inhaler Inhale 2 puffs into the lungs every 6 (six) hours as needed for wheezing or shortness of breath.    No facility-administered encounter medications on file as of 05/25/2020.    Functional Status:  In your present state of health, do you have any difficulty performing the following activities: 03/15/2020 03/08/2020  Hearing? N N  Vision? N N  Difficulty concentrating or making decisions? N N  Walking or climbing stairs? Y Y  Comment shortness of breath -  Dressing or bathing? [name]  Comment has aide -  Doing errands, shopping? [name]  Comment family assists -  Quarry manager and eating ? Y -  Comment aide and family help -  Using the Toilet? N -  In the past six months, have you accidently leaked urine? N -  Do you have problems with loss of bowel control? N -  Managing your Medications? N -  Managing your Finances? N -  Housekeeping or managing your Housekeeping? Y -  Comment family helps -  Some recent data might be hidden     Fall/Depression Screening: Fall Risk  04/26/2020 03/15/2020 02/08/2017  Falls in the past year? 0 0 No  Injury with Fall? - - No  Risk for fall due to : - - Medication side effect  Risk for fall due to: Comment - - -   PHQ 2/9 Scores 04/26/2020 03/15/2020 12/28/2016 08/18/2016 04/25/2016 10/12/2015 09/29/2015  PHQ - 2 Score 0 0 0 0 0 0 0    Assessment:  Goals Addressed            This Visit's Progress   . Make and Keep All Appointments   On track    Timeframe:  Long-Range Goal Priority:  High Start Date:    03/15/20                         Expected End Date:     10/01/20                  Follow Up Date 07/01/20   - ask family or friend for a ride - keep a calendar with appointment dates    Why is this important?    Part of staying healthy is seeing the doctor for follow-up care.   If you forget your appointments, there are some things you can do to stay on track.    Notes: 03/15/20 daughter takes to appointments.   04/20/20 Patient sees physician regularly.     . Track and Manage Symptoms-Heart Failure   On track    Timeframe:  Long-Range Goal Priority:  High Start Date:    03/15/20                         Expected End Date: 10/01/20                       Follow Up Date 07/01/20    - follow rescue plan if symptoms flare-up - know when to call the doctor    Why is this important?    You will be able to handle your symptoms better if you keep track of them.   Making some simple changes to your lifestyle will help.   Eating healthy is one thing you can do to take good care of yourself.    Notes: 03/15/20 Discussed with patient heart failure zones and rescue plan.  03/22/20 Continue to monitor for signs of heart failure. 04/20/20 Reiterated with patient signs of heart failure and mangement. 05/25/20 Reviewed signs of heart failure.       Plan: RN CM will follow up in June.   Follow-up:  Patient agrees to Care Plan and Follow-up.   Bary Leriche, RN, MSN [facility] Care  Management Care Management Coordinator Direct Line [phone] Cell [phone] Toll Free: [phone]  Fax: [phone]

## 2020-05-27 DIAGNOSIS — J449 Chronic obstructive pulmonary disease, unspecified: Secondary | ICD-10-CM | POA: Diagnosis not present

## 2020-06-07 DIAGNOSIS — I422 Other hypertrophic cardiomyopathy: Secondary | ICD-10-CM | POA: Diagnosis not present

## 2020-06-07 DIAGNOSIS — I10 Essential (primary) hypertension: Secondary | ICD-10-CM | POA: Diagnosis not present

## 2020-06-14 ENCOUNTER — Encounter: Payer: Self-pay | Admitting: Registered"

## 2020-06-14 ENCOUNTER — Other Ambulatory Visit: Payer: Self-pay

## 2020-06-14 ENCOUNTER — Encounter: Payer: Medicare HMO | Attending: Internal Medicine | Admitting: Registered"

## 2020-06-14 DIAGNOSIS — E119 Type 2 diabetes mellitus without complications: Secondary | ICD-10-CM | POA: Insufficient documentation

## 2020-06-14 NOTE — Progress Notes (Signed)
Diabetes Self-Management Education  Visit Type: Follow-up  Appt. Start Time: 1115 Appt. End Time: 1150  06/14/2020  Morgan Fischer, identified by name and date of birth, is a 70 y.o. female with a diagnosis of Diabetes: Type 2.   ASSESSMENT  There were no vitals taken for this visit. There is no height or weight on file to calculate BMI.  DM Medication: Glimepiride bid, Lantus 15 u bedtime usually around 10-11 pm , metformin 500 bid  Pt states she would like some ideas for breakfast.  Patient brought in a blood sugar log. Patient checks FBS most mornings around 8 am 135-298 mg/dL Pt states when FBS is high she believes it is due to eating rice or corn the night before or that she didn't sleep well.   Pt reports she has worked on getting better sleet by going to bed 1 hr earlier. Pt states when she gets up during the night she wants to pick up phone during night to check the time and the weather out of habit. Pt states sometimes she turns on the TV to have some light in the room.   Diabetes Self-Management Education - 06/14/20 1124       Visit Information   Visit Type Follow-up      Initial Visit   Diabetes Type Type 2      Complications   How often do you check your blood sugar? 1-2 times/day    Fasting Blood glucose range (mg/dL) 419-379;>024   097-353   Postprandial Blood glucose range (mg/dL) 299-242;>683   419-622     Dietary Intake   Breakfast cereal bar, water (cereal - almond clusters)    Lunch none    Snack (afternoon) none    Dinner pork chop, Brussels sprouts, 1/2 cup rice    Snack (evening) none    Beverage(s) decaf coffee, sugar free apple cider, diet ginger ale      Individualized Goals (developed by patient)   Nutrition General guidelines for healthy choices and portions discussed    Monitoring  test my blood glucose as discussed;Other (comment)   bring blood sugar log to appts   Reducing Risk Other (comment)   continue working on better sleep strategies      Patient Self-Evaluation of Goals - Patient rates self as meeting previously set goals (% of time)   Monitoring 50 - 75 %      Outcomes   Expected Outcomes Demonstrated interest in learning. Expect positive outcomes    Future DMSE 2 months    Program Status Not Completed      Subsequent Visit   Since your last visit have you continued or begun to take your medications as prescribed? Yes    Since your last visit, are you checking your blood glucose at least once a day? Yes             Individualized Plan for Diabetes Self-Management Training:   Learning Objective:  Patient will have a greater understanding of diabetes self-management. Patient education plan is to attend individual and/or group sessions per assessed needs and concerns.   Patient Instructions  Continue with your goal to break the habit of looking at your phone when you wake up during the night.  Ideas for breakfast: [place] yogurt, berries, nuts Oats, apple, egg, Grits and eggs, If you want fruit with breakfast consider having with your eggs and bacon breakfast. Scrambled egg, spinach and cheese, Expected Outcomes:  Demonstrated interest in learning. Expect positive outcomes  Education material provided: recipe from [employer] - oatmeal custard, low sodium seasoning  If problems or questions, patient to contact team via:  Phone and MyChart  Future DSME appointment: 2 months

## 2020-06-14 NOTE — Patient Instructions (Addendum)
Continue with your goal to break the habit of looking at your phone when you wake up during the night.  Ideas for breakfast: [place] yogurt, berries, nuts Oats, apple, egg, Grits and eggs, If you want fruit with breakfast consider having with your eggs and bacon breakfast. Scrambled egg, spinach and cheese,

## 2020-06-22 ENCOUNTER — Other Ambulatory Visit: Payer: Self-pay

## 2020-06-23 ENCOUNTER — Other Ambulatory Visit: Payer: Self-pay

## 2020-06-23 ENCOUNTER — Encounter: Payer: Self-pay | Admitting: Cardiology

## 2020-06-23 ENCOUNTER — Ambulatory Visit (INDEPENDENT_AMBULATORY_CARE_PROVIDER_SITE_OTHER): Payer: Medicare HMO | Admitting: Cardiology

## 2020-06-23 VITALS — BP 144/60 | HR 68 | Ht 63.6 in | Wt 293.0 lb

## 2020-06-23 DIAGNOSIS — I421 Obstructive hypertrophic cardiomyopathy: Secondary | ICD-10-CM | POA: Diagnosis not present

## 2020-06-23 DIAGNOSIS — I2510 Atherosclerotic heart disease of native coronary artery without angina pectoris: Secondary | ICD-10-CM

## 2020-06-23 DIAGNOSIS — E119 Type 2 diabetes mellitus without complications: Secondary | ICD-10-CM | POA: Diagnosis not present

## 2020-06-23 DIAGNOSIS — I10 Essential (primary) hypertension: Secondary | ICD-10-CM

## 2020-06-23 NOTE — Patient Instructions (Signed)
Medication Instructions:  No medication changes. *If you need a refill on your cardiac medications before your next appointment, please call your pharmacy*   Lab Work: Your physician recommends that you have a BMET done today.  If you have labs (blood work) drawn today and your tests are completely normal, you will receive your results only by: . MyChart Message (if you have MyChart) OR . A paper copy in the mail If you have any lab test that is abnormal or we need to change your treatment, we will call you to review the results.   Testing/Procedures: None ordered   Follow-Up: At [facility], you and your health needs are our priority.  As part of our continuing mission to provide you with exceptional heart care, we have created designated Provider Care Teams.  These Care Teams include your primary Cardiologist (physician) and Advanced Practice Providers (APPs -  Physician Assistants and Nurse Practitioners) who all work together to provide you with the care you need, when you need it.  We recommend signing up for the patient portal called "MyChart".  Sign up information is provided on this After Visit Summary.  MyChart is used to connect with patients for Virtual Visits (Telemedicine).  Patients are able to view lab/test results, encounter notes, upcoming appointments, etc.  Non-urgent messages can be sent to your provider as well.   To learn more about what you can do with MyChart, go to [email or web address].    Your next appointment:   6 month(s)  The format for your next appointment:   In Person  Provider:   Rajan Revankar, MD   Other Instructions NA  

## 2020-06-23 NOTE — Progress Notes (Signed)
Cardiology Office Note:    Date:  06/23/2020   ID:  Morgan Fischer, DOB 1950/01/15, MRN [id number]  PCP:  Jackie Plum, MD  Cardiologist:  Garwin Brothers, MD   Referring MD: Jackie Plum, MD    ASSESSMENT:    1. HOCM (hypertrophic obstructive cardiomyopathy) (HCC)   2. Essential hypertension   3. Atherosclerosis of native coronary artery of native heart without angina pectoris   4. Type 2 diabetes mellitus without complication, without long-term current use of insulin (HCC)   5. Morbid obesity (HCC)    PLAN:    In order of problems listed above:  Primary prevention stressed with the patient.  Importance of compliance with diet medication stressed and she vocalized understanding. Hypertrophic cardiomyopathy: Medical management at this time.  [facility] records were reviewed and discussed with her at length and questions were answered to her satisfaction.  She has no issues with syncope or any such problems. Mixed dyslipidemia: Diet emphasized.  She promises to do better. Diabetes mellitus and morbid obesity: Managed by primary care.  I discussed risks of obesity and she is going to do better with diet to lose weight. Patient will be seen in follow-up appointment in 6 months or earlier if the patient has any concerns    Medication Adjustments/Labs and Tests Ordered: Current medicines are reviewed at length with the patient today.  Concerns regarding medicines are outlined above.  No orders of the defined types were placed in this encounter.  No orders of the defined types were placed in this encounter.    No chief complaint on file.    History of Present Illness:    Morgan Fischer is a 70 y.o. female.  Has past medical history of hypertrophic cardiomyopathy, essential hypertension, dyslipidemia, diabetes mellitus and morbid obesity.  She has hypertrophic cardiomyopathy and this was reviewed by specialist at [facility].  Medical therapy and  continuation of current medications was recommended.  Patient denies any problems at this time.  She leads a sedentary lifestyle and is brought in here in a wheelchair.  At the time of my evaluation, the patient is alert awake oriented and in no distress. Past Medical History:  Diagnosis Date   Acute diastolic heart failure (HCC) 07/17/2019   Acute hypercapnic respiratory failure (HCC) 07/21/2016   Acute on chronic congestive heart failure (HCC) 07/19/2016   Acute on chronic diastolic CHF (congestive heart failure) (HCC) 03/08/2020   Acute on chronic respiratory failure with hypoxia (HCC) 10/20/2018   Acute on chronic respiratory failure with hypoxia and hypercapnia (HCC) 03/08/2020   Acute respiratory acidosis 07/19/2016   Acute respiratory distress 07/19/2016   Acute respiratory failure (HCC) 03/08/2020   Anxiety 03/15/2020   Aortic stenosis 11/01/2018   Arthritis    Asthma    Atherosclerosis of native coronary artery of native heart without angina pectoris 03/17/2016   Cardiomyopathy (HCC) 03/15/2020   Chest pain 07/14/2019   CHF (congestive heart failure) (HCC)    Chronic diastolic heart failure (HCC) 03/17/2016   Chronic hypoxemic respiratory failure (HCC)    CKD (chronic kidney disease)    COPD (chronic obstructive pulmonary disease) (HCC)    COPD with acute exacerbation (HCC) 03/08/2020   Diabetes mellitus without complication (HCC)    Drug allergy 07/21/2016   Essential (primary) hypertension 11/19/2016   Essential hypertension 11/19/2016   Gout 11/19/2016   Heart block AV complete (HCC)    High cholesterol    HOCM (hypertrophic obstructive cardiomyopathy) (HCC) 09/18/2017   Hyperglycemia  due to type 2 diabetes mellitus (HCC) 07/19/2016   Hyperlipidemia, unspecified 03/15/2020   Hypertension    Hypertensive heart disease with heart failure (HCC) 03/17/2016   Hypertensive heart disease without congestive heart failure 03/15/2020   Hypertrophic cardiomyopathy (HCC) 04/12/2020   Hypokalemia  08/24/2015   Hypomagnesemia    Hypoxia 03/17/2016   Iron deficiency 03/15/2020   Leukocytosis 07/19/2016   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   Mixed hyperlipidemia 03/15/2020   Morbid obesity (HCC)    Morbid obesity with BMI of 60.0-69.9, adult (HCC) 07/21/2016   Nonrheumatic aortic valve stenosis 03/17/2016   Obesity hypoventilation syndrome (HCC) 03/08/2020   Obstructive sleep apnea 11/19/2016   Osteoarthritis of knee 03/15/2020   Panniculitis 08/23/2015   Peripheral venous insufficiency 03/15/2020   Pure hypercholesterolemia 02/13/2017   SOB (shortness of breath) 08/24/2015   Troponin level elevated 08/24/2015   Type 2 diabetes mellitus without complication, without long-term current use of insulin (HCC) 04/12/2020   Type 2 diabetes mellitus without complications (HCC) 03/15/2020   Type 2 diabetes mellitus, without long-term current use of insulin (HCC) 11/19/2016    Past Surgical History:  Procedure Laterality Date   ABDOMINAL HYSTERECTOMY     CESAREAN SECTION      Current Medications: Current Meds  Medication Sig   allopurinol (ZYLOPRIM) 300 MG tablet Take 300 mg by mouth daily.   aspirin EC 81 MG EC tablet Take 1 tablet (81 mg total) by mouth daily.   BREO ELLIPTA 200-25 MCG/INH AEPB Inhale 1 puff into the lungs daily.   Cholecalciferol 50 MCG (2000 UT) CAPS Take 2,000 Units by mouth daily.   Cyanocobalamin (VITAMIN B-12 PO) Take 1 tablet by mouth daily.   diclofenac Sodium (VOLTAREN) 1 % GEL Apply 1 application topically as needed for pain (pain).   disopyramide (NORPACE CR) 100 MG 12 hr capsule Take 2 capsules (200 mg total) by mouth every 12 (twelve) hours.   ferrous sulfate 325 (65 FE) MG EC tablet Take 1 tablet by mouth daily.   furosemide (LASIX) 80 MG tablet Take 80 mg by mouth 2 (two) times daily.   Garlic 1000 MG CAPS Take 1,000 mg by mouth daily.   glimepiride (AMARYL) 4 MG tablet Take 4 mg by mouth 2 (two) times daily.   ipratropium-albuterol (DUONEB) 0.5-2.5 (3) MG/3ML SOLN  Inhale 3 mLs into the lungs every 4 (four) hours as needed for wheezing (wheezing).   LANTUS SOLOSTAR 100 UNIT/ML Solostar Pen Inject 15 Units into the skin at bedtime.   losartan (COZAAR) 100 MG tablet Take 1 tablet by mouth daily.   metFORMIN (GLUCOPHAGE-XR) 500 MG 24 hr tablet Take 500 mg by mouth 2 (two) times daily.   metoprolol succinate (TOPROL-XL) 100 MG 24 hr tablet Take 100 mg by mouth daily. Take with or immediately following a meal.   montelukast (SINGULAIR) 10 MG tablet Take 10 mg by mouth at bedtime.   Multiple Vitamin (MULTIVITAMIN WITH MINERALS) TABS tablet Take 1 tablet by mouth daily.   nitroGLYCERIN (NITROSTAT) 0.4 MG SL tablet Place 1 tablet (0.4 mg total) under the tongue every 5 (five) minutes as needed for chest pain.   Omega-3 Fatty Acids (FISH OIL) 1000 MG CAPS Take 1,000 mg by mouth 2 (two) times daily.    OXYGEN Inhale 2 L into the lungs continuous.   polyethylene glycol (MIRALAX / GLYCOLAX) 17 g packet Take 17 g by mouth as needed for mild constipation.   potassium chloride SA (KLOR-CON) 20 MEQ tablet Take 20 mEq by  mouth daily.   Probiotic Product (PROBIOTIC PO) Take 1 tablet by mouth daily in the afternoon.   rosuvastatin (CRESTOR) 40 MG tablet Take 40 mg by mouth at bedtime.   Turmeric 500 MG CAPS Take 1 capsule by mouth daily.   VENTOLIN HFA 108 (90 Base) MCG/ACT inhaler Inhale 2 puffs into the lungs every 6 (six) hours as needed for wheezing or shortness of breath.      Allergies:   Atorvastatin, Ciprofloxacin, Ciprocinonide [fluocinolone], Catapres [clonidine hcl], Clonidine, and Demadex [torsemide]   Social History   Socioeconomic History   Marital status: Widowed    Spouse name: Not on file   Number of children: Not on file   Years of education: Not on file   Highest education level: Not on file  Occupational History   Not on file  Tobacco Use   Smoking status: Never   Smokeless tobacco: Never  Vaping Use   Vaping Use: Never used  Substance and  Sexual Activity   Alcohol use: No   Drug use: No   Sexual activity: Never  Other Topics Concern   Not on file  Social History Narrative   Not on file   Social Determinants of Health   Financial Resource Strain: Not on file  Food Insecurity: No Food Insecurity   Worried About Running Out of Food in the Last Year: Never true   Ran Out of Food in the Last Year: Never true  Transportation Needs: No Transportation Needs   Lack of Transportation (Medical): No   Lack of Transportation (Non-Medical): No  Physical Activity: Not on file  Stress: Not on file  Social Connections: Not on file     Family History: The patient's family history includes Breast cancer in her sister; CAD in her mother; Cancer in her father and sister; Diabetes in her mother; Diabetes Mellitus I in her mother; Heart disease in her father.  ROS:   Please see the history of present illness.    All other systems reviewed and are negative.  EKGs/Labs/Other Studies Reviewed:    The following studies were reviewed today: I discussed my findings with the patient in extensive length and [facility] records were reviewed   Recent Labs: 03/08/2020: B Natriuretic Peptide 450.8 03/09/2020: ALT 24; TSH 0.662 03/10/2020: Magnesium 2.3 03/12/2020: BUN 42; Creatinine, Ser 1.33; Hemoglobin 10.1; Platelets 152; Potassium 3.6; Sodium 141  Recent Lipid Panel    Component Value Date/Time   CHOL 181 08/24/2015 0422   TRIG 100 08/24/2015 0422   HDL 43 08/24/2015 0422   CHOLHDL 4.2 08/24/2015 0422   VLDL 20 08/24/2015 0422   LDLCALC 118 (H) 08/24/2015 0422    Physical Exam:    VS:  BP (!) 144/60   Pulse 68   Ht 5' 3.6" (1.615 m)   Wt 293 lb (132.9 kg)   LMP  (LMP Unknown)   SpO2 96%   BMI 50.93 kg/m     Wt Readings from Last 3 Encounters:  06/23/20 293 lb (132.9 kg)  03/15/20 296 lb 0.6 oz (134.3 kg)  03/12/20 298 lb 11.6 oz (135.5 kg)     GEN: Patient is in no acute distress HEENT: Normal NECK: No JVD; No carotid  bruits LYMPHATICS: No lymphadenopathy CARDIAC: Hear sounds regular, 2/6 systolic murmur at the apex. RESPIRATORY:  Clear to auscultation without rales, wheezing or rhonchi  ABDOMEN: Soft, non-tender, non-distended MUSCULOSKELETAL:  No edema; No deformity  SKIN: Warm and dry NEUROLOGIC:  Alert and oriented x 3 PSYCHIATRIC:  Normal affect   Signed, Garwin Brothersajan R Shenequa Howse, MD  06/23/2020 11:07 AM    [facility]

## 2020-06-24 ENCOUNTER — Other Ambulatory Visit: Payer: Self-pay

## 2020-06-24 LAB — BASIC METABOLIC PANEL
BUN/Creatinine Ratio: 26 (ref 12–28)
BUN: 36 mg/dL — ABNORMAL HIGH (ref 8–27)
CO2: 30 mmol/L — ABNORMAL HIGH (ref 20–29)
Calcium: 9.6 mg/dL (ref 8.7–10.3)
Chloride: 96 mmol/L (ref 96–106)
Creatinine, Ser: 1.41 mg/dL — ABNORMAL HIGH (ref 0.57–1.00)
Glucose: 208 mg/dL — ABNORMAL HIGH (ref 65–99)
Potassium: 4 mmol/L (ref 3.5–5.2)
Sodium: 144 mmol/L (ref 134–144)
eGFR: 40 mL/min/{1.73_m2} — ABNORMAL LOW (ref >59)

## 2020-06-24 NOTE — Patient Outreach (Signed)
[facility]) Care Management  06/24/2020  Natalya Domzalski 10-04-1950 [id number]   Telephone call to patient for follow up. No answer. HIPAA compliant voice message left.  Plan: RN CM will attempt again within 4 business days.  Bary Leriche, RN, MSN [facility] Care Management Care Management Coordinator Direct Line [phone] Cell [phone] Toll Free: [phone]  Fax: [phone]

## 2020-06-27 DIAGNOSIS — J449 Chronic obstructive pulmonary disease, unspecified: Secondary | ICD-10-CM | POA: Diagnosis not present

## 2020-06-28 ENCOUNTER — Other Ambulatory Visit: Payer: Self-pay

## 2020-06-28 NOTE — Patient Outreach (Signed)
[facility]) Care Management  06/28/2020  Morgan Fischer Feb 17, 1950 [id number]   Telephone call to patient for follow up. No answer. HIPAA compliant voice message left.    Plan: RN CM will attempt patient again within 4 business days and send letter.   Bary Leriche, RN, MSN [facility] Care Management Care Management Coordinator Direct Line [phone] Cell [phone] Toll Free: [phone]  Fax: [phone]

## 2020-06-30 ENCOUNTER — Other Ambulatory Visit: Payer: Self-pay

## 2020-06-30 NOTE — Patient Outreach (Signed)
[facility]) Care Management  [facility] Care Manager  06/30/2020   Morgan Fischer 01-17-1950 [id number]  Subjective: Telephone call to patient for follow up. Patient report she is doing ok.  Discussed heart failure and management. She verbalized understanding.   Objective:   Encounter Medications:  Outpatient Encounter Medications as of 06/30/2020  Medication Sig   allopurinol (ZYLOPRIM) 300 MG tablet Take 300 mg by mouth daily.   aspirin EC 81 MG EC tablet Take 1 tablet (81 mg total) by mouth daily.   BREO ELLIPTA 200-25 MCG/INH AEPB Inhale 1 puff into the lungs daily.   Cholecalciferol 50 MCG (2000 UT) CAPS Take 2,000 Units by mouth daily.   Cyanocobalamin (VITAMIN B-12 PO) Take 1 tablet by mouth daily.   diclofenac Sodium (VOLTAREN) 1 % GEL Apply 1 application topically as needed for pain (pain).   disopyramide (NORPACE CR) 100 MG 12 hr capsule Take 2 capsules (200 mg total) by mouth every 12 (twelve) hours.   ferrous sulfate 325 (65 FE) MG EC tablet Take 1 tablet by mouth daily.   furosemide (LASIX) 80 MG tablet Take 80 mg by mouth 2 (two) times daily.   Garlic 1000 MG CAPS Take 1,000 mg by mouth daily.   glimepiride (AMARYL) 4 MG tablet Take 4 mg by mouth 2 (two) times daily.   ipratropium-albuterol (DUONEB) 0.5-2.5 (3) MG/3ML SOLN Inhale 3 mLs into the lungs every 4 (four) hours as needed for wheezing (wheezing).   LANTUS SOLOSTAR 100 UNIT/ML Solostar Pen Inject 15 Units into the skin at bedtime.   losartan (COZAAR) 100 MG tablet Take 1 tablet by mouth daily.   metFORMIN (GLUCOPHAGE-XR) 500 MG 24 hr tablet Take 500 mg by mouth 2 (two) times daily.   metoprolol succinate (TOPROL-XL) 100 MG 24 hr tablet Take 100 mg by mouth daily. Take with or immediately following a meal.   montelukast (SINGULAIR) 10 MG tablet Take 10 mg by mouth at bedtime.   Multiple Vitamin (MULTIVITAMIN WITH MINERALS) TABS tablet Take 1 tablet by mouth daily.   nitroGLYCERIN (NITROSTAT) 0.4 MG SL  tablet Place 1 tablet (0.4 mg total) under the tongue every 5 (five) minutes as needed for chest pain.   Omega-3 Fatty Acids (FISH OIL) 1000 MG CAPS Take 1,000 mg by mouth 2 (two) times daily.    OXYGEN Inhale 2 L into the lungs continuous.   polyethylene glycol (MIRALAX / GLYCOLAX) 17 g packet Take 17 g by mouth as needed for mild constipation.   potassium chloride SA (KLOR-CON) 20 MEQ tablet Take 20 mEq by mouth daily.   Probiotic Product (PROBIOTIC PO) Take 1 tablet by mouth daily in the afternoon.   rosuvastatin (CRESTOR) 40 MG tablet Take 40 mg by mouth at bedtime.   Turmeric 500 MG CAPS Take 1 capsule by mouth daily.   VENTOLIN HFA 108 (90 Base) MCG/ACT inhaler Inhale 2 puffs into the lungs every 6 (six) hours as needed for wheezing or shortness of breath.    No facility-administered encounter medications on file as of 06/30/2020.    Functional Status:  In your present state of health, do you have any difficulty performing the following activities: 03/15/2020 03/08/2020  Hearing? N N  Vision? N N  Difficulty concentrating or making decisions? N N  Walking or climbing stairs? Y Y  Comment shortness of breath -  Dressing or bathing? [name]  Comment has aide -  Doing errands, shopping? [name]  Comment family assists -  Quarry manager and  eating ? Y -  Comment aide and family help -  Using the Toilet? N -  In the past six months, have you accidently leaked urine? N -  Do you have problems with loss of bowel control? N -  Managing your Medications? N -  Managing your Finances? N -  Housekeeping or managing your Housekeeping? Y -  Comment family helps -  Some recent data might be hidden    Fall/Depression Screening: Fall Risk  04/26/2020 03/15/2020 02/08/2017  Falls in the past year? 0 0 No  Injury with Fall? - - No  Risk for fall due to : - - Medication side effect  Risk for fall due to: Comment - - -   PHQ 2/9 Scores 04/26/2020 03/15/2020 12/28/2016 08/18/2016 04/25/2016 10/12/2015  09/29/2015  PHQ - 2 Score 0 0 0 0 0 0 0    Assessment:   Patient Care Plan: General Plan of Care (Adult)     Problem Identified: Therapeutic Alliance (General Plan of Care)      Goal: Therapeutic Alliance Established as evidenced by patient participation in care management program Completed 04/20/2020  Start Date: 03/15/2020  Expected End Date: 05/31/2020  Recent Progress: On track  Priority: High  Note:   Evidence-based guidance:  Promote self-reliance and autonomy based on age and ability; discourage  overprotection.  Use empathy and nonjudgmental, participatory manner.  Notes: 03/15/20 Patient agreeable to care management involvement.  03/22/20 Patient continues to respond to [facility] outreach and support. 04/20/20 Patient continues CM program. 03/29/20 Patient continues to welcome CM Support.      Task: Develop Relationship to Effect Behavior Change Completed 04/20/2020  Due Date: 05/31/2020  Priority: Routine  Responsible User: Fleeta Emmer, RN  Note:   Care Management Activities:    - acceptance conveyed - care explained - questions answered - reassurance provided    Notes: 03/15/20 Discussed with patient health and health management strategies. 03/22/20 Patient continues to follow advice on health management.    Patient Care Plan: Heart Failure (Adult)     Problem Identified: Symptom Exacerbation (Heart Failure)      Long-Range Goal: Symptom Exacerbation Prevented or Minimized as evidenced by no exacerbation of heart failure   Start Date: 03/15/2020  Expected End Date: 01/01/2021  This Visit's Progress: On track  Recent Progress: On track  Priority: High  Note:   Evidence-based guidance:  Establish a mutually-agreed-upon early intervention process to communicate  with primary care provider when signs/symptoms worsen.  Notes: 03/15/20 Discussed heart failure management. Weight 293.4 lbs. 03/22/20 Patient continues with heart failure management. 291.9 lbs weight. Patient  continues on 1.5 liters fluid restriction.     03/29/20 Weight 287.1 lbs. Patient continues heart failure regimen.  04/20/20 Weight 290.1 lbs.    06/30/20  Patient reports weight 290 lbs.  She continues fluid restriction of 1.5 liters.      Task: Identify and Minimize Risk of Heart Failure Exacerbation   Due Date: 10/01/2020  Priority: Routine  Responsible User: Fleeta Emmer, RN  Note:   Care Management Activities:    - healthy lifestyle promoted - rescue (action) plan reviewed - self-awareness of signs/symptoms of worsening disease encouraged    Notes:  05/25/20 Patient weight 289 lbs Patient following heart failure regimen. 06/30/20  Patient reports weight 290 lbs.  She continues fluid restriction of 1.5 liters.   Heart Failure Management Reviewed Please weight daily or as ordered by your doctor Report to your doctor weight gain of 2 -3 pounds  in a day or 5 pounds in a week Limit salt intake Monitor for shortness of breath, swelling of feet, ankles or abdomen and weight gain. Never use the salt shaker.  Read all food labels and avoid canned, processed and pickled foods.   Follow your doctor's recommendations for daily salt intake       Plan:  Follow-up: Patient agrees to Care Plan and Follow-up. Follow-up in 3-4 week(s)  Bary Leriche, RN, MSN [facility] Care Management Care Management Coordinator Direct Line [phone] Cell [phone] Toll Free: [phone]  Fax: [phone]

## 2020-06-30 NOTE — Patient Instructions (Signed)
Goals Addressed             This Visit's Progress    COMPLETED: Make and Keep All Appointments   On track    Timeframe:  Long-Range Goal Priority:  High Start Date:    03/15/20                         Expected End Date:    01/01/21 Follow Up Date 07/01/20   - ask family or friend for a ride - keep a calendar with appointment dates    Why is this important?   Part of staying healthy is seeing the doctor for follow-up care.  If you forget your appointments, there are some things you can do to stay on track.    Notes: 03/15/20 daughter takes to appointments.   04/20/20 Patient sees physician regularly.       Track and Manage Symptoms-Heart Failure   On track    Timeframe:  Long-Range Goal Priority:  High Start Date:    03/15/20                         Expected End Date: 01/01/21               Follow Up Date 08/01/20   - follow rescue plan if symptoms flare-up - know when to call the doctor - track symptoms and what helps feel better or worse    Why is this important?   You will be able to handle your symptoms better if you keep track of them.  Making some simple changes to your lifestyle will help.  Eating healthy is one thing you can do to take good care of yourself.    Notes: 03/15/20 Discussed with patient heart failure zones and rescue plan.  03/22/20 Continue to monitor for signs of heart failure. 04/20/20 Reiterated with patient signs of heart failure and mangement. 05/25/20 Reviewed signs of heart failure. 06/30/20  Patient reports weight 290 lbs.  She continues fluid restriction of 1.5 liters.   Heart Failure Management Reviewed Please weight daily or as ordered by your doctor Report to your doctor weight gain of 2 -3 pounds in a day or 5 pounds in a week Limit salt intake Monitor for shortness of breath, swelling of feet, ankles or abdomen and weight gain. Never use the salt shaker.  Read all food labels and avoid canned, processed and pickled foods.   Follow your  doctor's recommendations for daily salt intake

## 2020-06-30 NOTE — Patient Outreach (Signed)
[facility]) Care Management  06/30/2020  Morgan Fischer 1950-08-17 [id number]   Telephone call to patient for follow up. No answer.  HIPAA compliant voice message left.   Plan: RN CM will attempt patient in 3-4 weeks.  Bary Leriche, RN, MSN [facility] Care Management Care Management Coordinator Direct Line [phone] Cell [phone] Toll Free: [phone]  Fax: [phone]

## 2020-07-06 DIAGNOSIS — E611 Iron deficiency: Secondary | ICD-10-CM | POA: Diagnosis not present

## 2020-07-06 DIAGNOSIS — I119 Hypertensive heart disease without heart failure: Secondary | ICD-10-CM | POA: Diagnosis not present

## 2020-07-06 DIAGNOSIS — E1165 Type 2 diabetes mellitus with hyperglycemia: Secondary | ICD-10-CM | POA: Diagnosis not present

## 2020-07-06 DIAGNOSIS — I872 Venous insufficiency (chronic) (peripheral): Secondary | ICD-10-CM | POA: Diagnosis not present

## 2020-07-06 DIAGNOSIS — I10 Essential (primary) hypertension: Secondary | ICD-10-CM | POA: Diagnosis not present

## 2020-07-06 DIAGNOSIS — J45909 Unspecified asthma, uncomplicated: Secondary | ICD-10-CM | POA: Diagnosis not present

## 2020-07-06 DIAGNOSIS — E782 Mixed hyperlipidemia: Secondary | ICD-10-CM | POA: Diagnosis not present

## 2020-07-06 DIAGNOSIS — Z0001 Encounter for general adult medical examination with abnormal findings: Secondary | ICD-10-CM | POA: Diagnosis not present

## 2020-07-20 ENCOUNTER — Other Ambulatory Visit: Payer: Self-pay

## 2020-07-20 NOTE — Patient Outreach (Signed)
[facility]) Care Management  [facility] Care Manager  07/20/2020   Morgan Fischer 03/30/1950 [id number]  Subjective: Telephone call to patient for follow up. Patient reports doing good.  Weight 285 lbs.  Reviewed heart failure management.  No concerns.   Objective:   Encounter Medications:  Outpatient Encounter Medications as of 07/20/2020  Medication Sig Note   allopurinol (ZYLOPRIM) 300 MG tablet Take 300 mg by mouth daily.    aspirin EC 81 MG EC tablet Take 1 tablet (81 mg total) by mouth daily.    BREO ELLIPTA 200-25 MCG/INH AEPB Inhale 1 puff into the lungs daily.    Cholecalciferol 50 MCG (2000 UT) CAPS Take 2,000 Units by mouth daily.    Cyanocobalamin (VITAMIN B-12 PO) Take 1 tablet by mouth daily.    diclofenac Sodium (VOLTAREN) 1 % GEL Apply 1 application topically as needed for pain (pain).    ferrous sulfate 325 (65 FE) MG EC tablet Take 1 tablet by mouth daily.    furosemide (LASIX) 80 MG tablet Take 80 mg by mouth 2 (two) times daily.    Garlic 1000 MG CAPS Take 1,000 mg by mouth daily.    ipratropium-albuterol (DUONEB) 0.5-2.5 (3) MG/3ML SOLN Inhale 3 mLs into the lungs every 4 (four) hours as needed for wheezing (wheezing).    LANTUS SOLOSTAR 100 UNIT/ML Solostar Pen Inject 15 Units into the skin at bedtime.    losartan (COZAAR) 100 MG tablet Take 1 tablet by mouth daily.    metFORMIN (GLUCOPHAGE-XR) 500 MG 24 hr tablet Take 500 mg by mouth 2 (two) times daily. 07/20/2020: Taking 2 tablets BID   metoprolol succinate (TOPROL-XL) 100 MG 24 hr tablet Take 100 mg by mouth daily. Take with or immediately following a meal.    montelukast (SINGULAIR) 10 MG tablet Take 10 mg by mouth at bedtime.    Multiple Vitamin (MULTIVITAMIN WITH MINERALS) TABS tablet Take 1 tablet by mouth daily.    nitroGLYCERIN (NITROSTAT) 0.4 MG SL tablet Place 1 tablet (0.4 mg total) under the tongue every 5 (five) minutes as needed for chest pain.    Omega-3 Fatty Acids (FISH OIL) 1000 MG  CAPS Take 1,000 mg by mouth 2 (two) times daily.     OXYGEN Inhale 2 L into the lungs continuous.    polyethylene glycol (MIRALAX / GLYCOLAX) 17 g packet Take 17 g by mouth as needed for mild constipation.    potassium chloride SA (KLOR-CON) 20 MEQ tablet Take 20 mEq by mouth daily.    Probiotic Product (PROBIOTIC PO) Take 1 tablet by mouth daily in the afternoon.    rosuvastatin (CRESTOR) 40 MG tablet Take 40 mg by mouth at bedtime.    Turmeric 500 MG CAPS Take 1 capsule by mouth daily.    VENTOLIN HFA 108 (90 Base) MCG/ACT inhaler Inhale 2 puffs into the lungs every 6 (six) hours as needed for wheezing or shortness of breath.     disopyramide (NORPACE CR) 100 MG 12 hr capsule Take 2 capsules (200 mg total) by mouth every 12 (twelve) hours.    glimepiride (AMARYL) 4 MG tablet Take 4 mg by mouth 2 (two) times daily. (Patient not taking: Reported on 07/20/2020)    No facility-administered encounter medications on file as of 07/20/2020.    Functional Status:  In your present state of health, do you have any difficulty performing the following activities: 07/20/2020 03/15/2020  Hearing? N N  Vision? N N  Difficulty concentrating or making decisions? N N  Walking or climbing stairs? Y Y  Comment shortness of breath shortness of breath  Dressing or bathing? [name]  Comment has aide has aide  Doing errands, shopping? [name]  Comment family assists family assists  Preparing Food and eating ? Y Y  Comment aide and family help aide and family help  Using the Toilet? N N  In the past six months, have you accidently leaked urine? N N  Do you have problems with loss of bowel control? N N  Managing your Medications? N N  Managing your Finances? - N  Housekeeping or managing your Housekeeping? [name]  Comment family helps family helps  Some recent data might be hidden    Fall/Depression Screening: Fall Risk  07/20/2020 04/26/2020 03/15/2020  Falls in the past year? 0 0 0  Injury with Fall? - - -  Risk for  fall due to : - - -  Risk for fall due to: Comment - - -   PHQ 2/9 Scores 07/20/2020 04/26/2020 03/15/2020 12/28/2016 08/18/2016 04/25/2016 10/12/2015  PHQ - 2 Score 0 0 0 0 0 0 0    Assessment:   Care Plan Care Plan : Heart Failure (Adult)  Updates made by Fleeta Emmer, RN since 07/20/2020 12:00 AM     Problem: Symptom Exacerbation (Heart Failure)      Long-Range Goal: Symptom Exacerbation Prevented or Minimized as evidenced by no exacerbation of heart failure   Start Date: 03/15/2020  Expected End Date: 01/01/2021  This Visit's Progress: On track  Recent Progress: On track  Priority: High  Note:   Evidence-based guidance:  Establish a mutually-agreed-upon early intervention process to communicate  with primary care provider when signs/symptoms worsen.  Notes: 03/15/20 Discussed heart failure management. Weight 293.4 lbs. 03/22/20 Patient continues with heart failure management. 291.9 lbs weight. Patient continues on 1.5 liters fluid restriction.     03/29/20 Weight 287.1 lbs. Patient continues heart failure regimen.  04/20/20 Weight 290.1 lbs.    06/30/20  Patient reports weight 290 lbs.  She continues fluid restriction of 1.5 liters.   07/20/20 Patient weight 285 lbs.  Fluid restriction continues.      Task: Identify and Minimize Risk of Heart Failure Exacerbation   Due Date: 01/01/2021  Priority: Routine  Responsible User: Fleeta Emmer, RN  Note:   Care Management Activities:    - healthy lifestyle promoted - rescue (action) plan reviewed - self-awareness of signs/symptoms of worsening disease encouraged    Notes:  05/25/20 Patient weight 289 lbs Patient following heart failure regimen. 06/30/20  Patient reports weight 290 lbs.  She continues fluid restriction of 1.5 liters.   Heart Failure Management Reviewed Please weight daily or as ordered by your doctor Report to your doctor weight gain of 2 -3 pounds in a day or 5 pounds in a week Limit salt intake Monitor for  shortness of breath, swelling of feet, ankles or abdomen and weight gain. Never use the salt shaker.  Read all food labels and avoid canned, processed and pickled foods.   Follow your doctor's recommendations for daily salt intake  07/20/20 Patient weight 285 lbs. Reviewed heart failure management and fluid restriction.  Patient verbalized understanding.        Goals Addressed               This Visit's Progress     Track and Manage Symptoms-Heart Failure (pt-stated)   On track     Timeframe:  Long-Range Goal Priority:  High Start  Date:    03/15/20                         Expected End Date: 01/01/21               Follow Up Date 09/01/20   - follow rescue plan if symptoms flare-up - know when to call the doctor - track symptoms and what helps feel better or worse    Why is this important?   You will be able to handle your symptoms better if you keep track of them.  Making some simple changes to your lifestyle will help.  Eating healthy is one thing you can do to take good care of yourself.    Notes: 03/15/20 Discussed with patient heart failure zones and rescue plan.  03/22/20 Continue to monitor for signs of heart failure. 04/20/20 Reiterated with patient signs of heart failure and mangement. 05/25/20 Reviewed signs of heart failure. 06/30/20  Patient reports weight 290 lbs.  She continues fluid restriction of 1.5 liters.   Heart Failure Management Reviewed Please weight daily or as ordered by your doctor Report to your doctor weight gain of 2 -3 pounds in a day or 5 pounds in a week Limit salt intake Monitor for shortness of breath, swelling of feet, ankles or abdomen and weight gain. Never use the salt shaker.  Read all food labels and avoid canned, processed and pickled foods.   Follow your doctor's recommendations for daily salt intake 07/20/20 Patient weight 285 lbs. Reviewed heart failure management and fluid restriction.  Patient verbalized understanding.           Plan:  Follow-up: Patient agrees to Care Plan and Follow-up. Follow-up in 4-6 week(s)  Bary Leriche, RN, MSN [facility] Care Management Care Management Coordinator Direct Line [phone] Cell [phone] Toll Free: [phone]  Fax: [phone]

## 2020-07-20 NOTE — Patient Instructions (Signed)
Goals Addressed               This Visit's Progress     Track and Manage Symptoms-Heart Failure (pt-stated)   On track     Timeframe:  Long-Range Goal Priority:  High Start Date:    03/15/20                         Expected End Date: 01/01/21               Follow Up Date 09/01/20   - follow rescue plan if symptoms flare-up - know when to call the doctor - track symptoms and what helps feel better or worse    Why is this important?   You will be able to handle your symptoms better if you keep track of them.  Making some simple changes to your lifestyle will help.  Eating healthy is one thing you can do to take good care of yourself.    Notes: 03/15/20 Discussed with patient heart failure zones and rescue plan.  03/22/20 Continue to monitor for signs of heart failure. 04/20/20 Reiterated with patient signs of heart failure and mangement. 05/25/20 Reviewed signs of heart failure. 06/30/20  Patient reports weight 290 lbs.  She continues fluid restriction of 1.5 liters.   Heart Failure Management Reviewed Please weight daily or as ordered by your doctor Report to your doctor weight gain of 2 -3 pounds in a day or 5 pounds in a week Limit salt intake Monitor for shortness of breath, swelling of feet, ankles or abdomen and weight gain. Never use the salt shaker.  Read all food labels and avoid canned, processed and pickled foods.   Follow your doctor's recommendations for daily salt intake 07/20/20 Patient weight 285 lbs. Reviewed heart failure management and fluid restriction.  Patient verbalized understanding.

## 2020-07-23 ENCOUNTER — Ambulatory Visit: Payer: Self-pay

## 2020-07-27 DIAGNOSIS — J449 Chronic obstructive pulmonary disease, unspecified: Secondary | ICD-10-CM | POA: Diagnosis not present

## 2020-08-03 DIAGNOSIS — E611 Iron deficiency: Secondary | ICD-10-CM | POA: Diagnosis not present

## 2020-08-03 DIAGNOSIS — I119 Hypertensive heart disease without heart failure: Secondary | ICD-10-CM | POA: Diagnosis not present

## 2020-08-03 DIAGNOSIS — N1832 Chronic kidney disease, stage 3b: Secondary | ICD-10-CM | POA: Diagnosis not present

## 2020-08-03 DIAGNOSIS — F419 Anxiety disorder, unspecified: Secondary | ICD-10-CM | POA: Diagnosis not present

## 2020-08-03 DIAGNOSIS — I10 Essential (primary) hypertension: Secondary | ICD-10-CM | POA: Diagnosis not present

## 2020-08-03 DIAGNOSIS — E782 Mixed hyperlipidemia: Secondary | ICD-10-CM | POA: Diagnosis not present

## 2020-08-03 DIAGNOSIS — E1165 Type 2 diabetes mellitus with hyperglycemia: Secondary | ICD-10-CM | POA: Diagnosis not present

## 2020-08-03 DIAGNOSIS — J4520 Mild intermittent asthma, uncomplicated: Secondary | ICD-10-CM | POA: Diagnosis not present

## 2020-08-04 DIAGNOSIS — M79671 Pain in right foot: Secondary | ICD-10-CM | POA: Diagnosis not present

## 2020-08-04 DIAGNOSIS — E1159 Type 2 diabetes mellitus with other circulatory complications: Secondary | ICD-10-CM | POA: Diagnosis not present

## 2020-08-04 DIAGNOSIS — L84 Corns and callosities: Secondary | ICD-10-CM | POA: Diagnosis not present

## 2020-08-04 DIAGNOSIS — B351 Tinea unguium: Secondary | ICD-10-CM | POA: Diagnosis not present

## 2020-08-04 DIAGNOSIS — M79672 Pain in left foot: Secondary | ICD-10-CM | POA: Diagnosis not present

## 2020-08-16 ENCOUNTER — Ambulatory Visit: Payer: Medicare HMO | Admitting: Registered"

## 2020-08-16 DIAGNOSIS — J45909 Unspecified asthma, uncomplicated: Secondary | ICD-10-CM | POA: Diagnosis not present

## 2020-08-16 DIAGNOSIS — J449 Chronic obstructive pulmonary disease, unspecified: Secondary | ICD-10-CM | POA: Diagnosis not present

## 2020-08-17 DIAGNOSIS — E7800 Pure hypercholesterolemia, unspecified: Secondary | ICD-10-CM | POA: Diagnosis not present

## 2020-08-17 DIAGNOSIS — Z0100 Encounter for examination of eyes and vision without abnormal findings: Secondary | ICD-10-CM | POA: Diagnosis not present

## 2020-08-17 DIAGNOSIS — H2513 Age-related nuclear cataract, bilateral: Secondary | ICD-10-CM | POA: Diagnosis not present

## 2020-08-17 DIAGNOSIS — E119 Type 2 diabetes mellitus without complications: Secondary | ICD-10-CM | POA: Diagnosis not present

## 2020-08-17 DIAGNOSIS — H524 Presbyopia: Secondary | ICD-10-CM | POA: Diagnosis not present

## 2020-08-17 DIAGNOSIS — H35033 Hypertensive retinopathy, bilateral: Secondary | ICD-10-CM | POA: Diagnosis not present

## 2020-08-18 DIAGNOSIS — I119 Hypertensive heart disease without heart failure: Secondary | ICD-10-CM | POA: Diagnosis not present

## 2020-08-18 DIAGNOSIS — I10 Essential (primary) hypertension: Secondary | ICD-10-CM | POA: Diagnosis not present

## 2020-08-18 DIAGNOSIS — E1165 Type 2 diabetes mellitus with hyperglycemia: Secondary | ICD-10-CM | POA: Diagnosis not present

## 2020-08-18 DIAGNOSIS — F419 Anxiety disorder, unspecified: Secondary | ICD-10-CM | POA: Diagnosis not present

## 2020-08-18 DIAGNOSIS — E782 Mixed hyperlipidemia: Secondary | ICD-10-CM | POA: Diagnosis not present

## 2020-08-18 DIAGNOSIS — J4520 Mild intermittent asthma, uncomplicated: Secondary | ICD-10-CM | POA: Diagnosis not present

## 2020-08-18 DIAGNOSIS — E611 Iron deficiency: Secondary | ICD-10-CM | POA: Diagnosis not present

## 2020-08-18 DIAGNOSIS — N1832 Chronic kidney disease, stage 3b: Secondary | ICD-10-CM | POA: Diagnosis not present

## 2020-08-24 ENCOUNTER — Other Ambulatory Visit: Payer: Self-pay

## 2020-08-24 DIAGNOSIS — I2781 Cor pulmonale (chronic): Secondary | ICD-10-CM | POA: Diagnosis not present

## 2020-08-24 DIAGNOSIS — J45909 Unspecified asthma, uncomplicated: Secondary | ICD-10-CM | POA: Diagnosis not present

## 2020-08-24 DIAGNOSIS — R0602 Shortness of breath: Secondary | ICD-10-CM | POA: Diagnosis not present

## 2020-08-24 NOTE — Patient Outreach (Signed)
[facility]) Care Management  08/24/2020  Aritza Brunet [birth year] [id number]   Telephone call to patient for disease management follow up.   No answer.  HIPAA compliant voice message left.    Plan: If no return call, RN CM will attempt patient again in September.  Bary Leriche, RN, MSN [facility] Care Management Care Management Coordinator Direct Line [phone] Cell [phone] Toll Free: [phone]  Fax: [phone]

## 2020-08-26 DIAGNOSIS — E1165 Type 2 diabetes mellitus with hyperglycemia: Secondary | ICD-10-CM | POA: Diagnosis not present

## 2020-08-26 DIAGNOSIS — Z794 Long term (current) use of insulin: Secondary | ICD-10-CM | POA: Diagnosis not present

## 2020-08-27 DIAGNOSIS — J449 Chronic obstructive pulmonary disease, unspecified: Secondary | ICD-10-CM | POA: Diagnosis not present

## 2020-08-30 DIAGNOSIS — I10 Essential (primary) hypertension: Secondary | ICD-10-CM | POA: Diagnosis not present

## 2020-08-30 DIAGNOSIS — N1832 Chronic kidney disease, stage 3b: Secondary | ICD-10-CM | POA: Diagnosis not present

## 2020-08-30 DIAGNOSIS — N390 Urinary tract infection, site not specified: Secondary | ICD-10-CM | POA: Diagnosis not present

## 2020-09-09 DIAGNOSIS — I10 Essential (primary) hypertension: Secondary | ICD-10-CM | POA: Diagnosis not present

## 2020-09-09 DIAGNOSIS — R809 Proteinuria, unspecified: Secondary | ICD-10-CM | POA: Diagnosis not present

## 2020-09-13 DIAGNOSIS — I10 Essential (primary) hypertension: Secondary | ICD-10-CM | POA: Diagnosis not present

## 2020-09-13 DIAGNOSIS — N390 Urinary tract infection, site not specified: Secondary | ICD-10-CM | POA: Diagnosis not present

## 2020-09-13 DIAGNOSIS — N1832 Chronic kidney disease, stage 3b: Secondary | ICD-10-CM | POA: Diagnosis not present

## 2020-09-15 DIAGNOSIS — N289 Disorder of kidney and ureter, unspecified: Secondary | ICD-10-CM | POA: Diagnosis not present

## 2020-09-15 DIAGNOSIS — N200 Calculus of kidney: Secondary | ICD-10-CM | POA: Diagnosis not present

## 2020-09-15 DIAGNOSIS — N1832 Chronic kidney disease, stage 3b: Secondary | ICD-10-CM | POA: Diagnosis not present

## 2020-09-15 DIAGNOSIS — K8020 Calculus of gallbladder without cholecystitis without obstruction: Secondary | ICD-10-CM | POA: Diagnosis not present

## 2020-09-21 ENCOUNTER — Other Ambulatory Visit: Payer: Self-pay

## 2020-09-21 NOTE — Patient Outreach (Signed)
[facility]) Care Management  [facility] Care Manager  09/21/2020   Morgan Fischer [birth year] [id number]  Subjective: Telephone call to patient for disease management support.  Patient doing ok.  Some weight gain but has some changes with medication.  Discussed heart failure management and MD notification of weight gain.  Discussed low salt diet and items to avoid.  No concerns.    Objective:   Encounter Medications:  Outpatient Encounter Medications as of 09/21/2020  Medication Sig Note   allopurinol (ZYLOPRIM) 300 MG tablet Take 300 mg by mouth daily.    aspirin EC 81 MG EC tablet Take 1 tablet (81 mg total) by mouth daily.    BREO ELLIPTA 200-25 MCG/INH AEPB Inhale 1 puff into the lungs daily.    Cholecalciferol 50 MCG (2000 UT) CAPS Take 2,000 Units by mouth daily.    Cyanocobalamin (VITAMIN B-12 PO) Take 1 tablet by mouth daily.    diclofenac Sodium (VOLTAREN) 1 % GEL Apply 1 application topically as needed for pain (pain).    disopyramide (NORPACE CR) 100 MG 12 hr capsule Take 2 capsules (200 mg total) by mouth every 12 (twelve) hours.    ferrous sulfate 325 (65 FE) MG EC tablet Take 1 tablet by mouth daily.    furosemide (LASIX) 80 MG tablet Take 80 mg by mouth 2 (two) times daily.    Garlic 1000 MG CAPS Take 1,000 mg by mouth daily.    glimepiride (AMARYL) 4 MG tablet Take 4 mg by mouth 2 (two) times daily. (Patient not taking: Reported on 07/20/2020)    ipratropium-albuterol (DUONEB) 0.5-2.5 (3) MG/3ML SOLN Inhale 3 mLs into the lungs every 4 (four) hours as needed for wheezing (wheezing).    LANTUS SOLOSTAR 100 UNIT/ML Solostar Pen Inject 15 Units into the skin at bedtime.    losartan (COZAAR) 100 MG tablet Take 1 tablet by mouth daily.    metFORMIN (GLUCOPHAGE-XR) 500 MG 24 hr tablet Take 500 mg by mouth 2 (two) times daily. 07/20/2020: Taking 2 tablets BID   metoprolol succinate (TOPROL-XL) 100 MG 24 hr tablet Take 100 mg by mouth daily. Take with or immediately  following a meal.    montelukast (SINGULAIR) 10 MG tablet Take 10 mg by mouth at bedtime.    Multiple Vitamin (MULTIVITAMIN WITH MINERALS) TABS tablet Take 1 tablet by mouth daily.    nitroGLYCERIN (NITROSTAT) 0.4 MG SL tablet Place 1 tablet (0.4 mg total) under the tongue every 5 (five) minutes as needed for chest pain.    Omega-3 Fatty Acids (FISH OIL) 1000 MG CAPS Take 1,000 mg by mouth 2 (two) times daily.     OXYGEN Inhale 2 L into the lungs continuous.    polyethylene glycol (MIRALAX / GLYCOLAX) 17 g packet Take 17 g by mouth as needed for mild constipation.    potassium chloride SA (KLOR-CON) 20 MEQ tablet Take 20 mEq by mouth daily.    Probiotic Product (PROBIOTIC PO) Take 1 tablet by mouth daily in the afternoon.    rosuvastatin (CRESTOR) 40 MG tablet Take 40 mg by mouth at bedtime.    Turmeric 500 MG CAPS Take 1 capsule by mouth daily.    VENTOLIN HFA 108 (90 Base) MCG/ACT inhaler Inhale 2 puffs into the lungs every 6 (six) hours as needed for wheezing or shortness of breath.     No facility-administered encounter medications on file as of 09/21/2020.    Functional Status:  In your present state of health, do you have any  difficulty performing the following activities: 07/20/2020 03/15/2020  Hearing? N N  Vision? N N  Difficulty concentrating or making decisions? N N  Walking or climbing stairs? Y Y  Comment shortness of breath shortness of breath  Dressing or bathing? [name]  Comment has aide has aide  Doing errands, shopping? [name]  Comment family assists family assists  Preparing Food and eating ? Y Y  Comment aide and family help aide and family help  Using the Toilet? N N  In the past six months, have you accidently leaked urine? N N  Do you have problems with loss of bowel control? N N  Managing your Medications? N N  Managing your Finances? - N  Housekeeping or managing your Housekeeping? [name]  Comment family helps family helps  Some recent data might be hidden     Fall/Depression Screening: Fall Risk  07/20/2020 04/26/2020 03/15/2020  Falls in the past year? 0 0 0  Injury with Fall? - - -  Risk for fall due to : - - -  Risk for fall due to: Comment - - -   PHQ 2/9 Scores 07/20/2020 04/26/2020 03/15/2020 12/28/2016 08/18/2016 04/25/2016 10/12/2015  PHQ - 2 Score 0 0 0 0 0 0 0    Assessment:   Care Plan Care Plan : Heart Failure (Adult)  Updates made by Fleeta Emmer, RN since 09/21/2020 12:00 AM     Problem: Symptom Exacerbation (Heart Failure)      Long-Range Goal: Symptom Exacerbation Prevented or Minimized as evidenced by no exacerbation of heart failure   Start Date: 03/15/2020  Expected End Date: 07/01/2021  This Visit's Progress: On track  Recent Progress: On track  Priority: High  Note:   Evidence-based guidance:  Establish a mutually-agreed-upon early intervention process to communicate  with primary care provider when signs/symptoms worsen.  Notes: 03/15/20 Discussed heart failure management. Weight 293.4 lbs. 03/22/20 Patient continues with heart failure management. 291.9 lbs weight. Patient continues on 1.5 liters fluid restriction.     03/29/20 Weight 287.1 lbs. Patient continues heart failure regimen.  04/20/20 Weight 290.1 lbs.    06/30/20  Patient reports weight 290 lbs.  She continues fluid restriction of 1.5 liters.   07/20/20 Patient weight 285 lbs.  Fluid restriction continues.        Goals Addressed               This Visit's Progress     Track and Manage Symptoms-Heart Failure (pt-stated)   On track     Timeframe:  Long-Range Goal Priority:  High Start Date:    03/15/20                         Expected End Date: 07/01/21               Follow Up Date 11/01/20   - follow rescue plan if symptoms flare-up - know when to call the doctor - track symptoms and what helps feel better or worse    Why is this important?   You will be able to handle your symptoms better if you keep track of them.  Making some simple  changes to your lifestyle will help.  Eating healthy is one thing you can do to take good care of yourself.    Notes: 03/15/20 Discussed with patient heart failure zones and rescue plan.  03/22/20 Continue to monitor for signs of heart failure. 04/20/20 Reiterated with patient signs of  heart failure and mangement. 05/25/20 Reviewed signs of heart failure. 06/30/20  Patient reports weight 290 lbs.  She continues fluid restriction of 1.5 liters.   Heart Failure Management Reviewed Please weight daily or as ordered by your doctor Report to your doctor weight gain of 2 -3 pounds in a day or 5 pounds in a week Limit salt intake Monitor for shortness of breath, swelling of feet, ankles or abdomen and weight gain. Never use the salt shaker.  Read all food labels and avoid canned, processed and pickled foods.   Follow your doctor's recommendations for daily salt intake 07/20/20 Patient weight 285 lbs. Reviewed heart failure management and fluid restriction.  Patient verbalized understanding.   09/21/20 Patient has had some changes in medications from the kidney doctor.  Weight up to 290.1 lbs.  Discussed heart failure management and need to contact physician for increased weight if it continues. Also discussed high and low salt food items.  Daily weights continue.          Plan:  Follow-up: Patient agrees to Care Plan and Follow-up. Follow-up in 3-6 week(s)  Bary Leriche, RN, MSN [facility] Care Management Care Management Coordinator Direct Line [phone] Cell [phone] Toll Free: [phone]  Fax: [phone]

## 2020-09-21 NOTE — Patient Instructions (Signed)
Goals Addressed               This Visit's Progress     Track and Manage Symptoms-Heart Failure (pt-stated)   On track     Timeframe:  Long-Range Goal Priority:  High Start Date:    03/15/20                         Expected End Date: 07/01/21               Follow Up Date 11/01/20   - follow rescue plan if symptoms flare-up - know when to call the doctor - track symptoms and what helps feel better or worse    Why is this important?   You will be able to handle your symptoms better if you keep track of them.  Making some simple changes to your lifestyle will help.  Eating healthy is one thing you can do to take good care of yourself.    Notes: 03/15/20 Discussed with patient heart failure zones and rescue plan.  03/22/20 Continue to monitor for signs of heart failure. 04/20/20 Reiterated with patient signs of heart failure and mangement. 05/25/20 Reviewed signs of heart failure. 06/30/20  Patient reports weight 290 lbs.  She continues fluid restriction of 1.5 liters.   Heart Failure Management Reviewed Please weight daily or as ordered by your doctor Report to your doctor weight gain of 2 -3 pounds in a day or 5 pounds in a week Limit salt intake Monitor for shortness of breath, swelling of feet, ankles or abdomen and weight gain. Never use the salt shaker.  Read all food labels and avoid canned, processed and pickled foods.   Follow your doctor's recommendations for daily salt intake 07/20/20 Patient weight 285 lbs. Reviewed heart failure management and fluid restriction.  Patient verbalized understanding.   09/21/20 Patient has had some changes in medications from the kidney doctor.  Weight up to 290.1 lbs.  Discussed heart failure management and need to contact physician for increased weight if it continues. Also discussed high and low salt food items.  Daily weights continue.

## 2020-09-27 DIAGNOSIS — J449 Chronic obstructive pulmonary disease, unspecified: Secondary | ICD-10-CM | POA: Diagnosis not present

## 2020-10-06 DIAGNOSIS — E1159 Type 2 diabetes mellitus with other circulatory complications: Secondary | ICD-10-CM | POA: Diagnosis not present

## 2020-10-06 DIAGNOSIS — B351 Tinea unguium: Secondary | ICD-10-CM | POA: Diagnosis not present

## 2020-10-06 DIAGNOSIS — M79671 Pain in right foot: Secondary | ICD-10-CM | POA: Diagnosis not present

## 2020-10-06 DIAGNOSIS — M79672 Pain in left foot: Secondary | ICD-10-CM | POA: Diagnosis not present

## 2020-10-06 DIAGNOSIS — L84 Corns and callosities: Secondary | ICD-10-CM | POA: Diagnosis not present

## 2020-10-11 ENCOUNTER — Ambulatory Visit: Payer: Medicare HMO | Admitting: Registered"

## 2020-10-19 ENCOUNTER — Other Ambulatory Visit: Payer: Self-pay

## 2020-10-19 DIAGNOSIS — N183 Chronic kidney disease, stage 3 unspecified: Secondary | ICD-10-CM | POA: Diagnosis not present

## 2020-10-19 DIAGNOSIS — I10 Essential (primary) hypertension: Secondary | ICD-10-CM | POA: Diagnosis not present

## 2020-10-19 DIAGNOSIS — R809 Proteinuria, unspecified: Secondary | ICD-10-CM | POA: Diagnosis not present

## 2020-10-19 NOTE — Patient Outreach (Signed)
[facility]) Care Management  10/19/2020  Morgan Fischer 10/14/1950 [id number]   Telephone call to patient for disease management follow up.   No answer.  HIPAA compliant voice message left.    Plan: If no return call, RN CM will attempt patient again in the month of November.    Bary Leriche, RN, MSN [facility] Care Management Care Management Coordinator Direct Line [phone] Cell [phone] Toll Free: [phone]  Fax: [phone]

## 2020-10-20 DIAGNOSIS — I10 Essential (primary) hypertension: Secondary | ICD-10-CM | POA: Diagnosis not present

## 2020-10-20 DIAGNOSIS — N1832 Chronic kidney disease, stage 3b: Secondary | ICD-10-CM | POA: Diagnosis not present

## 2020-10-27 DIAGNOSIS — Z23 Encounter for immunization: Secondary | ICD-10-CM | POA: Diagnosis not present

## 2020-10-27 DIAGNOSIS — F419 Anxiety disorder, unspecified: Secondary | ICD-10-CM | POA: Diagnosis not present

## 2020-10-27 DIAGNOSIS — J4520 Mild intermittent asthma, uncomplicated: Secondary | ICD-10-CM | POA: Diagnosis not present

## 2020-10-27 DIAGNOSIS — I10 Essential (primary) hypertension: Secondary | ICD-10-CM | POA: Diagnosis not present

## 2020-10-27 DIAGNOSIS — Z794 Long term (current) use of insulin: Secondary | ICD-10-CM | POA: Diagnosis not present

## 2020-10-27 DIAGNOSIS — E1165 Type 2 diabetes mellitus with hyperglycemia: Secondary | ICD-10-CM | POA: Diagnosis not present

## 2020-10-27 DIAGNOSIS — J449 Chronic obstructive pulmonary disease, unspecified: Secondary | ICD-10-CM | POA: Diagnosis not present

## 2020-11-01 DIAGNOSIS — M542 Cervicalgia: Secondary | ICD-10-CM | POA: Diagnosis not present

## 2020-11-08 DIAGNOSIS — M542 Cervicalgia: Secondary | ICD-10-CM | POA: Diagnosis not present

## 2020-11-09 DIAGNOSIS — N183 Chronic kidney disease, stage 3 unspecified: Secondary | ICD-10-CM | POA: Diagnosis not present

## 2020-11-09 DIAGNOSIS — I10 Essential (primary) hypertension: Secondary | ICD-10-CM | POA: Diagnosis not present

## 2020-11-11 DIAGNOSIS — N1832 Chronic kidney disease, stage 3b: Secondary | ICD-10-CM | POA: Diagnosis not present

## 2020-11-11 DIAGNOSIS — I10 Essential (primary) hypertension: Secondary | ICD-10-CM | POA: Diagnosis not present

## 2020-11-11 DIAGNOSIS — N390 Urinary tract infection, site not specified: Secondary | ICD-10-CM | POA: Diagnosis not present

## 2020-11-12 ENCOUNTER — Ambulatory Visit: Payer: Medicare HMO | Admitting: Registered"

## 2020-11-18 DIAGNOSIS — N183 Chronic kidney disease, stage 3 unspecified: Secondary | ICD-10-CM | POA: Diagnosis not present

## 2020-11-18 DIAGNOSIS — I10 Essential (primary) hypertension: Secondary | ICD-10-CM | POA: Diagnosis not present

## 2020-11-18 DIAGNOSIS — R6884 Jaw pain: Secondary | ICD-10-CM | POA: Diagnosis not present

## 2020-11-22 ENCOUNTER — Other Ambulatory Visit: Payer: Self-pay

## 2020-11-22 DIAGNOSIS — N390 Urinary tract infection, site not specified: Secondary | ICD-10-CM | POA: Diagnosis not present

## 2020-11-22 DIAGNOSIS — N1832 Chronic kidney disease, stage 3b: Secondary | ICD-10-CM | POA: Diagnosis not present

## 2020-11-22 DIAGNOSIS — I10 Essential (primary) hypertension: Secondary | ICD-10-CM | POA: Diagnosis not present

## 2020-11-22 NOTE — Patient Outreach (Signed)
[name]) Care Management  [facility] Care Manager  11/22/2020   Morgan Fischer 06-25-1950 [id number]  Subjective: Telephone call to patient for disease management follow up.  Patient reports doing well.  Discussed heart failure and management.  No concerns.    Objective:   Encounter Medications:  Outpatient Encounter Medications as of 11/22/2020  Medication Sig Note   allopurinol (ZYLOPRIM) 300 MG tablet Take 300 mg by mouth daily.    aspirin EC 81 MG EC tablet Take 1 tablet (81 mg total) by mouth daily.    BREO ELLIPTA 200-25 MCG/INH AEPB Inhale 1 puff into the lungs daily.    Cholecalciferol 50 MCG (2000 UT) CAPS Take 2,000 Units by mouth daily.    Cyanocobalamin (VITAMIN B-12 PO) Take 1 tablet by mouth daily.    diclofenac Sodium (VOLTAREN) 1 % GEL Apply 1 application topically as needed for pain (pain).    ferrous sulfate 325 (65 FE) MG EC tablet Take 1 tablet by mouth daily.    furosemide (LASIX) 80 MG tablet Take 80 mg by mouth 2 (two) times daily.    Garlic 1000 MG CAPS Take 1,000 mg by mouth daily.    ipratropium-albuterol (DUONEB) 0.5-2.5 (3) MG/3ML SOLN Inhale 3 mLs into the lungs every 4 (four) hours as needed for wheezing (wheezing).    LANTUS SOLOSTAR 100 UNIT/ML Solostar Pen Inject 15 Units into the skin at bedtime.    losartan (COZAAR) 100 MG tablet Take 1 tablet by mouth daily.    metFORMIN (GLUCOPHAGE-XR) 500 MG 24 hr tablet Take 500 mg by mouth 2 (two) times daily. 07/20/2020: Taking 2 tablets BID   metoprolol succinate (TOPROL-XL) 100 MG 24 hr tablet Take 100 mg by mouth daily. Take with or immediately following a meal.    montelukast (SINGULAIR) 10 MG tablet Take 10 mg by mouth at bedtime.    Multiple Vitamin (MULTIVITAMIN WITH MINERALS) TABS tablet Take 1 tablet by mouth daily.    nitroGLYCERIN (NITROSTAT) 0.4 MG SL tablet Place 1 tablet (0.4 mg total) under the tongue every 5 (five) minutes as needed for chest pain.    Omega-3 Fatty Acids (FISH  OIL) 1000 MG CAPS Take 1,000 mg by mouth 2 (two) times daily.     OXYGEN Inhale 2 L into the lungs continuous.    polyethylene glycol (MIRALAX / GLYCOLAX) 17 g packet Take 17 g by mouth as needed for mild constipation.    potassium chloride SA (KLOR-CON) 20 MEQ tablet Take 20 mEq by mouth daily.    Probiotic Product (PROBIOTIC PO) Take 1 tablet by mouth daily in the afternoon.    rosuvastatin (CRESTOR) 40 MG tablet Take 40 mg by mouth at bedtime.    Turmeric 500 MG CAPS Take 1 capsule by mouth daily.    VENTOLIN HFA 108 (90 Base) MCG/ACT inhaler Inhale 2 puffs into the lungs every 6 (six) hours as needed for wheezing or shortness of breath.     disopyramide (NORPACE CR) 100 MG 12 hr capsule Take 2 capsules (200 mg total) by mouth every 12 (twelve) hours.    glimepiride (AMARYL) 4 MG tablet Take 4 mg by mouth 2 (two) times daily. (Patient not taking: Reported on 07/20/2020)    No facility-administered encounter medications on file as of 11/22/2020.    Functional Status:  In your present state of health, do you have any difficulty performing the following activities: 11/22/2020 07/20/2020  Hearing? N N  Vision? N N  Difficulty concentrating or making decisions? N  N  Walking or climbing stairs? Y Y  Comment shortness of breath shortness of breath  Dressing or bathing? Morgan Fischer  Comment has aide has aide  Doing errands, shopping? Morgan Fischer  Comment family assists family assists  Preparing Food and eating ? Morgan Fischer  Comment family help and aide help aide and family help  Using the Toilet? N N  In the past six months, have you accidently leaked urine? N N  Do you have problems with loss of bowel control? N N  Managing your Medications? N N  Managing your Finances? N -  Housekeeping or managing your Housekeeping? Morgan Fischer  Comment family helps family helps  Some recent data might be hidden    Fall/Depression Screening: Fall Risk  11/22/2020 07/20/2020 04/26/2020  Falls in the past year? 0 0 0  Injury with  Fall? - - -  Risk for fall due to : - - -  Risk for fall due to: Comment - - -   PHQ 2/9 Scores 11/22/2020 07/20/2020 04/26/2020 03/15/2020 12/28/2016 08/18/2016 04/25/2016  PHQ - 2 Score 0 0 0 0 0 0 0    Assessment:   Care Plan Care Plan : General Plan of Care (Adult)  Updates made by Fleeta Emmer, RN since 11/22/2020 12:00 AM  Completed 11/22/2020   Problem: Therapeutic Alliance (General Plan of Care) Resolved 11/22/2020     Care Plan : Heart Failure (Adult)  Updates made by Fleeta Emmer, RN since 11/22/2020 12:00 AM  Completed 11/22/2020   Problem: Symptom Exacerbation (Heart Failure) Resolved 11/22/2020     Long-Range Goal: Symptom Exacerbation Prevented or Minimized as evidenced by no exacerbation of heart failure Completed 11/22/2020  Start Date: 03/15/2020  Expected End Date: 07/01/2021  Recent Progress: On track  Priority: High  Note:   Evidence-based guidance:  Establish a mutually-agreed-upon early intervention process to communicate  with primary care provider when signs/symptoms worsen.  Notes: 03/15/20 Discussed heart failure management. Weight 293.4 lbs. 03/22/20 Patient continues with heart failure management. 291.9 lbs weight. Patient continues on 1.5 liters fluid restriction.     03/29/20 Weight 287.1 lbs. Patient continues heart failure regimen.  04/20/20 Weight 290.1 lbs.    06/30/20  Patient reports weight 290 lbs.  She continues fluid restriction of 1.5 liters.   07/20/20 Patient weight 285 lbs.  Fluid restriction continues.   11/22/20 Resolving Duplicate goal    Task: Identify and Minimize Risk of Heart Failure Exacerbation Completed 11/22/2020  Due Date: 01/01/2021  Priority: Routine  Responsible User: Fleeta Emmer, RN  Note:   Care Management Activities:    - healthy lifestyle promoted - rescue (action) plan reviewed - self-awareness of signs/symptoms of worsening disease encouraged    Notes:  05/25/20 Patient weight 289 lbs Patient following  heart failure regimen. 06/30/20  Patient reports weight 290 lbs.  She continues fluid restriction of 1.5 liters.   Heart Failure Management Reviewed Please weight daily or as ordered by your doctor Report to your doctor weight gain of 2 -3 pounds in a day or 5 pounds in a week Limit salt intake Monitor for shortness of breath, swelling of feet, ankles or abdomen and weight gain. Never use the salt shaker.  Read all food labels and avoid canned, processed and pickled foods.   Follow your doctor's recommendations for daily salt intake  07/20/20 Patient weight 285 lbs. Reviewed heart failure management and fluid restriction.  Patient verbalized understanding.      Care Plan : RN Care  Manager Plan of [place]  Updates made by Fleeta Emmer, RN since 11/22/2020 12:00 AM     Problem: Chronic Disease management and care coordination of heart failure   Priority: High     Long-Range Goal: Development of Plan oof Care related to management of heart failure   Start Date: 11/22/2020  Expected End Date: 12/01/2021  Priority: High  Note:   Current Barriers:  Chronic Disease Management support and education needs related to CHF  RNCM Clinical Goal(s):  Patient will verbalize basic understanding of CHF disease process and self health management plan as evidenced by No acute exacerbation of heart failure continue to work with RN Care Manager and/or Social Worker to address care management and care coordination needs related to CHF as evidenced by adherence to CM Team Scheduled appointments     through collaboration with Medical illustrator, provider, and care team.   Interventions: Education and support related to heart failure Inter-disciplinary care team collaboration (see longitudinal plan of care) Evaluation of current treatment plan related to  self management and patient's adherence to plan as established by provider Patient now seeing kidney specialist to help protect kidney function.  Continued  discussion and rationale at to reasoning and benefit of seeing kidney specialist.   Patient weight 291 lbs.  Encouraged continued weights and notifying physician for changes.      Patient Goals/Self-Care Activities: Take medications as prescribed   Attend all scheduled provider appointments call office if I gain more than 2 pounds in one day or 5 pounds in one week track weight in diary use salt in moderation watch for swelling in feet, ankles and legs every day weigh myself daily follow rescue plan if symptoms flare-up        Goals Addressed               This Visit's Progress     COMPLETED: Track and Manage Symptoms-Heart Failure (pt-stated)   On track     Timeframe:  Long-Range Goal Priority:  High Start Date:    03/15/20                         Expected End Date: 07/01/21               Follow Up Date 11/01/20   - follow rescue plan if symptoms flare-up - know when to call the doctor - track symptoms and what helps feel better or worse    Why is this important?   You will be able to handle your symptoms better if you keep track of them.  Making some simple changes to your lifestyle will help.  Eating healthy is one thing you can do to take good care of yourself.    Notes: 03/15/20 Discussed with patient heart failure zones and rescue plan.  03/22/20 Continue to monitor for signs of heart failure. 04/20/20 Reiterated with patient signs of heart failure and mangement. 05/25/20 Reviewed signs of heart failure. 06/30/20  Patient reports weight 290 lbs.  She continues fluid restriction of 1.5 liters.   Heart Failure Management Reviewed Please weight daily or as ordered by your doctor Report to your doctor weight gain of 2 -3 pounds in a day or 5 pounds in a week Limit salt intake Monitor for shortness of breath, swelling of feet, ankles or abdomen and weight gain. Never use the salt shaker.  Read all food labels and avoid canned, processed and pickled foods.   Follow  your doctor's recommendations for daily salt intake 07/20/20 Patient weight 285 lbs. Reviewed heart failure management and fluid restriction.  Patient verbalized understanding.   09/21/20 Patient has had some changes in medications from the kidney doctor.  Weight up to 290.1 lbs.  Discussed heart failure management and need to contact physician for increased weight if it continues. Also discussed high and low salt food items.  Daily weights continue.   11/22/20 Resolving duplicate goal        Plan:  Follow-up: Patient agrees to Care Plan and Follow-up. Follow-up in 3 month(s)  Bary Leriche, RN, MSN [facility] Care Management Care Management Coordinator Direct Line [phone] Cell [phone] Toll Free: [phone]  Fax: [phone]

## 2020-11-22 NOTE — Patient Instructions (Signed)
Patient Goals/Self-Care Activities: Take medications as prescribed   Attend all scheduled provider appointments call office if I gain more than 2 pounds in one day or 5 pounds in one week track weight in diary use salt in moderation watch for swelling in feet, ankles and legs every day weigh myself daily follow rescue plan if symptoms flare-up

## 2020-11-22 NOTE — Patient Outreach (Signed)
[facility]) Care Management  11/22/2020  Morgan Fischer Oct 28, 1950 [id number]  CMA made contact with patient per [facility] Care Management patient survey completed.  Baruch Gouty [facility] Management Assistant [phone]

## 2020-11-23 ENCOUNTER — Ambulatory Visit: Payer: Self-pay

## 2020-11-23 DIAGNOSIS — N6311 Unspecified lump in the right breast, upper outer quadrant: Secondary | ICD-10-CM | POA: Diagnosis not present

## 2020-11-23 DIAGNOSIS — R922 Inconclusive mammogram: Secondary | ICD-10-CM | POA: Diagnosis not present

## 2020-11-23 DIAGNOSIS — N6312 Unspecified lump in the right breast, upper inner quadrant: Secondary | ICD-10-CM | POA: Diagnosis not present

## 2020-11-23 DIAGNOSIS — R928 Other abnormal and inconclusive findings on diagnostic imaging of breast: Secondary | ICD-10-CM | POA: Diagnosis not present

## 2020-12-08 DIAGNOSIS — M79671 Pain in right foot: Secondary | ICD-10-CM | POA: Diagnosis not present

## 2020-12-08 DIAGNOSIS — L84 Corns and callosities: Secondary | ICD-10-CM | POA: Diagnosis not present

## 2020-12-08 DIAGNOSIS — M79672 Pain in left foot: Secondary | ICD-10-CM | POA: Diagnosis not present

## 2020-12-08 DIAGNOSIS — B351 Tinea unguium: Secondary | ICD-10-CM | POA: Diagnosis not present

## 2020-12-08 DIAGNOSIS — N179 Acute kidney failure, unspecified: Secondary | ICD-10-CM | POA: Diagnosis not present

## 2020-12-08 DIAGNOSIS — I10 Essential (primary) hypertension: Secondary | ICD-10-CM | POA: Diagnosis not present

## 2020-12-08 DIAGNOSIS — E1159 Type 2 diabetes mellitus with other circulatory complications: Secondary | ICD-10-CM | POA: Diagnosis not present

## 2020-12-09 DIAGNOSIS — N390 Urinary tract infection, site not specified: Secondary | ICD-10-CM | POA: Diagnosis not present

## 2020-12-09 DIAGNOSIS — I10 Essential (primary) hypertension: Secondary | ICD-10-CM | POA: Diagnosis not present

## 2020-12-09 DIAGNOSIS — N1832 Chronic kidney disease, stage 3b: Secondary | ICD-10-CM | POA: Diagnosis not present

## 2020-12-14 DIAGNOSIS — I10 Essential (primary) hypertension: Secondary | ICD-10-CM | POA: Diagnosis not present

## 2020-12-14 DIAGNOSIS — E1165 Type 2 diabetes mellitus with hyperglycemia: Secondary | ICD-10-CM | POA: Diagnosis not present

## 2020-12-14 DIAGNOSIS — Z794 Long term (current) use of insulin: Secondary | ICD-10-CM | POA: Diagnosis not present

## 2020-12-14 DIAGNOSIS — J4520 Mild intermittent asthma, uncomplicated: Secondary | ICD-10-CM | POA: Diagnosis not present

## 2020-12-14 DIAGNOSIS — F419 Anxiety disorder, unspecified: Secondary | ICD-10-CM | POA: Diagnosis not present

## 2020-12-21 DIAGNOSIS — N183 Chronic kidney disease, stage 3 unspecified: Secondary | ICD-10-CM | POA: Diagnosis not present

## 2020-12-21 DIAGNOSIS — E119 Type 2 diabetes mellitus without complications: Secondary | ICD-10-CM | POA: Diagnosis not present

## 2020-12-21 DIAGNOSIS — R0602 Shortness of breath: Secondary | ICD-10-CM | POA: Diagnosis not present

## 2020-12-21 DIAGNOSIS — I10 Essential (primary) hypertension: Secondary | ICD-10-CM | POA: Diagnosis not present

## 2020-12-21 DIAGNOSIS — I2781 Cor pulmonale (chronic): Secondary | ICD-10-CM | POA: Diagnosis not present

## 2020-12-23 DIAGNOSIS — N390 Urinary tract infection, site not specified: Secondary | ICD-10-CM | POA: Diagnosis not present

## 2020-12-23 DIAGNOSIS — N1832 Chronic kidney disease, stage 3b: Secondary | ICD-10-CM | POA: Diagnosis not present

## 2020-12-23 DIAGNOSIS — I10 Essential (primary) hypertension: Secondary | ICD-10-CM | POA: Diagnosis not present

## 2021-01-03 ENCOUNTER — Ambulatory Visit: Payer: Medicare HMO | Admitting: Registered"

## 2021-01-11 ENCOUNTER — Other Ambulatory Visit: Payer: Self-pay | Admitting: Cardiology

## 2021-01-11 MED ORDER — DISOPYRAMIDE PHOSPHATE ER 100 MG PO CP12: 200.0000 mg | ORAL_CAPSULE | Freq: Two times a day (BID) | ORAL | 2 refills | Status: DC

## 2021-01-24 DIAGNOSIS — K644 Residual hemorrhoidal skin tags: Secondary | ICD-10-CM | POA: Diagnosis not present

## 2021-01-24 DIAGNOSIS — Z1211 Encounter for screening for malignant neoplasm of colon: Secondary | ICD-10-CM | POA: Diagnosis not present

## 2021-01-24 DIAGNOSIS — J9610 Chronic respiratory failure, unspecified whether with hypoxia or hypercapnia: Secondary | ICD-10-CM | POA: Diagnosis not present

## 2021-01-24 DIAGNOSIS — Z9981 Dependence on supplemental oxygen: Secondary | ICD-10-CM | POA: Diagnosis not present

## 2021-01-24 DIAGNOSIS — K5730 Diverticulosis of large intestine without perforation or abscess without bleeding: Secondary | ICD-10-CM | POA: Diagnosis not present

## 2021-01-24 DIAGNOSIS — G4733 Obstructive sleep apnea (adult) (pediatric): Secondary | ICD-10-CM | POA: Diagnosis not present

## 2021-01-24 DIAGNOSIS — K640 First degree hemorrhoids: Secondary | ICD-10-CM | POA: Diagnosis not present

## 2021-01-24 DIAGNOSIS — E119 Type 2 diabetes mellitus without complications: Secondary | ICD-10-CM | POA: Diagnosis not present

## 2021-01-24 DIAGNOSIS — K5900 Constipation, unspecified: Secondary | ICD-10-CM | POA: Diagnosis not present

## 2021-02-02 ENCOUNTER — Other Ambulatory Visit: Payer: Self-pay

## 2021-02-02 NOTE — Patient Outreach (Signed)
[facility]) Care Management  02/02/2021  Morgan Fischer 10-31-1950 [id number]   Telephone Assessment    Unsuccessful outreach attempt patient. No answer after multiple rings.       Plan: Assigned RN CM will make outreach attempt to patient within the month of March if no return call.    Enzo Montgomery, RN,BSN,CCM [place] Management Telephonic Care Management Coordinator Direct Phone: [phone] Toll Free: [phone] Fax: [phone]

## 2021-02-04 ENCOUNTER — Other Ambulatory Visit: Payer: Self-pay

## 2021-02-04 DIAGNOSIS — I872 Venous insufficiency (chronic) (peripheral): Secondary | ICD-10-CM

## 2021-02-04 DIAGNOSIS — N1832 Chronic kidney disease, stage 3b: Secondary | ICD-10-CM

## 2021-02-04 DIAGNOSIS — J4520 Mild intermittent asthma, uncomplicated: Secondary | ICD-10-CM

## 2021-02-04 DIAGNOSIS — Z794 Long term (current) use of insulin: Secondary | ICD-10-CM

## 2021-02-04 HISTORY — DX: Mild intermittent asthma, uncomplicated: J45.20

## 2021-02-04 HISTORY — DX: Long term (current) use of insulin: Z79.4

## 2021-02-04 HISTORY — DX: Chronic kidney disease, stage 3b: N18.32

## 2021-02-09 DIAGNOSIS — B351 Tinea unguium: Secondary | ICD-10-CM | POA: Diagnosis not present

## 2021-02-09 DIAGNOSIS — M79672 Pain in left foot: Secondary | ICD-10-CM | POA: Diagnosis not present

## 2021-02-09 DIAGNOSIS — M79671 Pain in right foot: Secondary | ICD-10-CM | POA: Diagnosis not present

## 2021-02-09 DIAGNOSIS — L84 Corns and callosities: Secondary | ICD-10-CM | POA: Diagnosis not present

## 2021-02-09 DIAGNOSIS — E1159 Type 2 diabetes mellitus with other circulatory complications: Secondary | ICD-10-CM | POA: Diagnosis not present

## 2021-02-10 ENCOUNTER — Other Ambulatory Visit: Payer: Self-pay

## 2021-02-10 ENCOUNTER — Ambulatory Visit: Payer: Medicare HMO | Admitting: Cardiology

## 2021-02-10 ENCOUNTER — Encounter: Payer: Self-pay | Admitting: Cardiology

## 2021-02-10 VITALS — BP 183/72 | HR 66 | Ht 63.0 in | Wt 308.0 lb

## 2021-02-10 DIAGNOSIS — E7800 Pure hypercholesterolemia, unspecified: Secondary | ICD-10-CM

## 2021-02-10 DIAGNOSIS — J431 Panlobular emphysema: Secondary | ICD-10-CM

## 2021-02-10 DIAGNOSIS — I10 Essential (primary) hypertension: Secondary | ICD-10-CM | POA: Diagnosis not present

## 2021-02-10 DIAGNOSIS — I422 Other hypertrophic cardiomyopathy: Secondary | ICD-10-CM | POA: Diagnosis not present

## 2021-02-10 DIAGNOSIS — I5032 Chronic diastolic (congestive) heart failure: Secondary | ICD-10-CM

## 2021-02-10 MED ORDER — DISOPYRAMIDE PHOSPHATE ER 100 MG PO CP12: 200.0000 mg | ORAL_CAPSULE | Freq: Two times a day (BID) | ORAL | 3 refills | Status: DC

## 2021-02-10 MED ORDER — METOPROLOL SUCCINATE ER 50 MG PO TB24: 50.0000 mg | ORAL_TABLET | Freq: Every day | ORAL | 3 refills | Status: DC

## 2021-02-10 NOTE — Progress Notes (Signed)
Cardiology Office Note:    Date:  02/10/2021   ID:  Morgan Fischer, DOB Jan 10, 1950, MRN [id number]  PCP:  Jackie Plum, MD  Cardiologist:  Garwin Brothers, MD   Referring MD: Jackie Plum, MD    ASSESSMENT:    1. Hypertrophic cardiomyopathy (HCC)   2. Pure hypercholesterolemia   3. Primary hypertension   4. Chronic diastolic heart failure (HCC)   5. Morbid obesity (HCC)   6. Panlobular emphysema (HCC)    PLAN:    In order of problems listed above:  Primary prevention stressed with patient.  Importance of compliance with diet medications stressed and she vocalized understanding. Hypertrophic cardiomyopathy: I reviewed echocardiogram report with her.  She pursues medical therapy and has been very consistent with her approach to this in the past.  She has not had any palpitations syncope or any such issues.  We will respect pressures. Essential hypertension: Blood pressure is stable and diet was emphasized.  It appears she has an element of whitecoat hypertension.  Her blood pressure stable at home and she mentioned to me the numbers. Mixed dyslipidemia, diabetes mellitus and morbid obesity: Lifestyle modification urged weight reduction stressed and she promises to do better.  Diet emphasized. COPD on supplemental oxygen: Followed by primary care. Patient will be seen in follow-up appointment in 6 months or earlier if the patient has any concerns  Medication Adjustments/Labs and Tests Ordered: Current medicines are reviewed at length with the patient today.  Concerns regarding medicines are outlined above.  No orders of the defined types were placed in this encounter.  No orders of the defined types were placed in this encounter.    No chief complaint on file.    History of Present Illness:    Morgan Fischer is a 71 y.o. female.  Patient has past medical history of hypertrophic cardiomyopathy obstructive in nature, essential hypertension, mixed dyslipidemia, diabetes  mellitus and morbid obesity.  She is on oxygen supplementation.  She denies any chest pain orthopnea or PND.  At the time of my evaluation, the patient is alert awake oriented and in no distress.  Overall she leads a sedentary lifestyle.  She denies any history of palpitations or any syncope.  She has been evaluated for hypertrophic cardiomyopathy at [facility] in the past.  Past Medical History:  Diagnosis Date   Acute diastolic heart failure (HCC) 07/17/2019   Acute hypercapnic respiratory failure (HCC) 07/21/2016   Acute on chronic congestive heart failure (HCC) 07/19/2016   Acute on chronic diastolic CHF (congestive heart failure) (HCC) 03/08/2020   Acute on chronic respiratory failure with hypoxia (HCC) 10/20/2018   Acute on chronic respiratory failure with hypoxia and hypercapnia (HCC) 03/08/2020   Acute respiratory acidosis (HCC) 07/19/2016   Acute respiratory distress 07/19/2016   Acute respiratory failure (HCC) 03/08/2020   Anxiety 03/15/2020   Aortic stenosis 11/01/2018   Arthritis    Asthma    Atherosclerosis of native coronary artery of native heart without angina pectoris 03/17/2016   Cardiomyopathy (HCC) 03/15/2020   Chest pain 07/14/2019   CHF (congestive heart failure) (HCC)    Chronic diastolic heart failure (HCC) 03/17/2016   Chronic hypoxemic respiratory failure (HCC)    Chronic kidney disease, stage 3b (HCC) 02/04/2021   CKD (chronic kidney disease)    COPD (chronic obstructive pulmonary disease) (HCC)    COPD with acute exacerbation (HCC) 03/08/2020   Diabetes mellitus without complication (HCC)    Drug allergy 07/21/2016   Essential (primary) hypertension 11/19/2016  Essential hypertension 11/19/2016   Gout 11/19/2016   Heart block AV complete (HCC)    High cholesterol    HOCM (hypertrophic obstructive cardiomyopathy) ([place]) 09/18/2017   Hyperglycemia due to type 2 diabetes mellitus ([place]) 07/19/2016   Hyperlipidemia, unspecified 03/15/2020   Hypertension    Hypertensive heart  disease with heart failure ([place]) 03/17/2016   Hypertensive heart disease without congestive heart failure 03/15/2020   Hypertrophic cardiomyopathy ([place]) 04/12/2020   Hypokalemia 08/24/2015   Hypomagnesemia    Hypoxia 03/17/2016   Iron deficiency 03/15/2020   Leukocytosis 07/19/2016   Long term (current) use of insulin ([place]) 02/04/2021   Mild intermittent asthma 02/04/2021   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   Mixed hyperlipidemia 03/15/2020   Morbid obesity ([place])    Morbid obesity with BMI of 60.0-69.9, adult ([place]) 07/21/2016   Nonrheumatic aortic valve stenosis 03/17/2016   Obesity hypoventilation syndrome ([place]) 03/08/2020   Obstructive sleep apnea 11/19/2016   Osteoarthritis of knee 03/15/2020   Panniculitis 08/23/2015   Peripheral venous insufficiency 03/15/2020   Pure hypercholesterolemia 02/13/2017   SOB (shortness of breath) 08/24/2015   Troponin level elevated 08/24/2015   Type 2 diabetes mellitus with hyperglycemia ([place]) 07/19/2016   Type 2 diabetes mellitus without complication, without long-term current use of insulin ([place]) 04/12/2020   Type 2 diabetes mellitus without complications ([place]) [id number]   Type 2 diabetes mellitus, without long-term current use of insulin ([place]) 11/19/2016    Past Surgical History:  Procedure Laterality Date   ABDOMINAL HYSTERECTOMY     CESAREAN SECTION      Current Medications: Current Meds  Medication Sig   allopurinol (ZYLOPRIM) 300 MG tablet Take 300 mg by mouth daily.   aspirin EC 81 MG EC tablet Take 1 tablet (81 mg total) by mouth daily.   Cholecalciferol 50 MCG (2000 UT) CAPS Take 2,000 Units by mouth daily.   Cyanocobalamin (VITAMIN B-12 PO) Take 1 tablet by mouth daily.   dapagliflozin propanediol (FARXIGA) 10 MG TABS tablet Take 10 mg by mouth daily.   diclofenac Sodium (VOLTAREN) 1 % GEL Apply 1 application topically as needed for pain (pain).   disopyramide (NORPACE CR) 100 MG 12 hr capsule Take 2 capsules (200 mg total) by mouth every 12 (twelve)  hours.   ferrous sulfate 325 (65 FE) MG EC tablet Take 1 tablet by mouth daily.   fluticasone furoate-vilanterol (BREO ELLIPTA) 100-25 MCG/ACT AEPB Inhale 1 puff into the lungs daily.   furosemide (LASIX) 20 MG tablet Take 20 mg by mouth daily.   Garlic 123XX123 MG CAPS Take 1,000 mg by mouth daily.   ipratropium-albuterol (DUONEB) 0.5-2.5 (3) MG/3ML SOLN Inhale 3 mLs into the lungs every 4 (four) hours as needed for wheezing (wheezing).   LANTUS SOLOSTAR 100 UNIT/ML Solostar Pen Inject 15 Units into the skin at bedtime.   losartan (COZAAR) 100 MG tablet Take 1 tablet by mouth daily.   metoprolol succinate (TOPROL-XL) 50 MG 24 hr tablet Take 50 mg by mouth daily.   montelukast (SINGULAIR) 10 MG tablet Take 10 mg by mouth at bedtime.   Multiple Vitamin (MULTIVITAMIN WITH MINERALS) TABS tablet Take 1 tablet by mouth daily.   nitroGLYCERIN (NITROSTAT) 0.4 MG SL tablet Place 1 tablet (0.4 mg total) under the tongue every 5 (five) minutes as needed for chest pain.   Omega-3 Fatty Acids (FISH OIL) 1000 MG CAPS Take 1,000 mg by mouth 2 (two) times daily.    OXYGEN Inhale 2 L into the lungs continuous.   polyethylene  glycol (MIRALAX / GLYCOLAX) 17 g packet Take 17 g by mouth as needed for mild constipation.   Probiotic Product (PROBIOTIC PO) Take 1 tablet by mouth daily in the afternoon.   rosuvastatin (CRESTOR) 40 MG tablet Take 40 mg by mouth at bedtime.   spironolactone (ALDACTONE) 25 MG tablet Take 25 mg by mouth daily.   Turmeric 500 MG CAPS Take 1 capsule by mouth daily.   VENTOLIN HFA 108 (90 Base) MCG/ACT inhaler Inhale 2 puffs into the lungs every 6 (six) hours as needed for wheezing or shortness of breath.      Allergies:   Atorvastatin, Ciprofloxacin, Ciprocinonide [fluocinolone], Catapres [clonidine hcl], Clonidine, and Demadex [torsemide]   Social History   Socioeconomic History   Marital status: Widowed    Spouse name: Not on file   Number of children: Not on file   Years of  education: Not on file   Highest education level: Not on file  Occupational History   Not on file  Tobacco Use   Smoking status: Never   Smokeless tobacco: Never  Vaping Use   Vaping Use: Never used  Substance and Sexual Activity   Alcohol use: No   Drug use: No   Sexual activity: Never  Other Topics Concern   Not on file  Social History Narrative   Not on file   Social Determinants of Health   Financial Resource Strain: Not on file  Food Insecurity: No Food Insecurity   Worried About Running Out of Food in the Last Year: Never true   [place] in the Last Year: Never true  Transportation Needs: No Transportation Needs   Lack of Transportation (Medical): No   Lack of Transportation (Non-Medical): No  Physical Activity: Not on file  Stress: Not on file  Social Connections: Not on file     Family History: The patient's family history includes Breast cancer in her sister; CAD in her mother; Cancer in her father and sister; Diabetes in her mother; Diabetes Mellitus I in her mother; Heart disease in her father.  ROS:   Please see the history of present illness.    All other systems reviewed and are negative.  EKGs/Labs/Other Studies Reviewed:    The following studies were reviewed today: EKG reveals sinus rhythm, left ventricular hypertrophy QTc prolongation.   Recent Labs: 03/08/2020: B Natriuretic Peptide 450.8 03/09/2020: ALT 24; TSH 0.662 03/10/2020: Magnesium 2.3 03/12/2020: Hemoglobin 10.1; Platelets 152 06/23/2020: BUN 36; Creatinine, Ser 1.41; Potassium 4.0; Sodium 144  Recent Lipid Panel    Component Value Date/Time   CHOL 181 08/24/2015 0422   TRIG 100 08/24/2015 0422   HDL 43 08/24/2015 0422   CHOLHDL 4.2 08/24/2015 0422   VLDL 20 08/24/2015 0422   LDLCALC 118 (H) 08/24/2015 0422    Physical Exam:    VS:  BP (!) 183/72    Pulse 66    Ht 5\' 3"  (1.6 m)    Wt (!) 308 lb 0.6 oz (139.7 kg)    LMP  (LMP Unknown)    SpO2 (!) 88%    BMI 54.57 kg/m     Wt  Readings from Last 3 Encounters:  02/10/21 (!) 308 lb 0.6 oz (139.7 kg)  06/23/20 293 lb (132.9 kg)  03/15/20 296 lb 0.6 oz (134.3 kg)     GEN: Patient is in no acute distress HEENT: Normal NECK: No JVD; No carotid bruits LYMPHATICS: No lymphadenopathy CARDIAC: Hear sounds regular, 2/6 systolic murmur at the apex. RESPIRATORY:  Clear to auscultation without rales, wheezing or rhonchi  ABDOMEN: Soft, non-tender, non-distended MUSCULOSKELETAL:  No edema; No deformity  SKIN: Warm and dry NEUROLOGIC:  Alert and oriented x 3 PSYCHIATRIC:  Normal affect   Signed, Jenean Lindau, MD  02/10/2021 10:45 AM    [facility]

## 2021-02-10 NOTE — Patient Instructions (Addendum)
Medication Instructions:  Your physician recommends that you continue on your current medications as directed. Please refer to the Current Medication list given to you today.  *If you need a refill on your cardiac medications before your next appointment, please call your pharmacy*   Lab Work: Your physician recommends that you have labs done in the office today. Your test included  basic metabolic panel and magnesium.  If you have labs (blood work) drawn today and your tests are completely normal, you will receive your results only by: MyChart Message (if you have MyChart) OR A paper copy in the mail If you have any lab test that is abnormal or we need to change your treatment, we will call you to review the results.   Testing/Procedures: None ordered   Follow-Up: At [facility], you and your health needs are our priority.  As part of our continuing mission to provide you with exceptional heart care, we have created designated Provider Care Teams.  These Care Teams include your primary Cardiologist (physician) and Advanced Practice Providers (APPs -  Physician Assistants and Nurse Practitioners) who all work together to provide you with the care you need, when you need it.  We recommend signing up for the patient portal called "MyChart".  Sign up information is provided on this After Visit Summary.  MyChart is used to connect with patients for Virtual Visits (Telemedicine).  Patients are able to view lab/test results, encounter notes, upcoming appointments, etc.  Non-urgent messages can be sent to your provider as well.   To learn more about what you can do with MyChart, go to [email or web address].    Your next appointment:   9 month(s)  The format for your next appointment:   In Person  Provider:   Belva Crome, MD   Other Instructions NA

## 2021-02-11 ENCOUNTER — Ambulatory Visit: Payer: Self-pay

## 2021-02-11 LAB — BASIC METABOLIC PANEL
BUN/Creatinine Ratio: 17 (ref 12–28)
BUN: 25 mg/dL (ref 8–27)
CO2: 28 mmol/L (ref 20–29)
Calcium: 9 mg/dL (ref 8.7–10.3)
Chloride: 103 mmol/L (ref 96–106)
Creatinine, Ser: 1.43 mg/dL — ABNORMAL HIGH (ref 0.57–1.00)
Glucose: 153 mg/dL — ABNORMAL HIGH (ref 70–99)
Potassium: 4.4 mmol/L (ref 3.5–5.2)
Sodium: 144 mmol/L (ref 134–144)
eGFR: 39 mL/min/{1.73_m2} — ABNORMAL LOW (ref >59)

## 2021-02-11 LAB — MAGNESIUM: Magnesium: 2.2 mg/dL (ref 1.6–2.3)

## 2021-02-17 ENCOUNTER — Encounter: Payer: Self-pay | Admitting: Cardiology

## 2021-02-28 DIAGNOSIS — N183 Chronic kidney disease, stage 3 unspecified: Secondary | ICD-10-CM | POA: Diagnosis not present

## 2021-02-28 DIAGNOSIS — I10 Essential (primary) hypertension: Secondary | ICD-10-CM | POA: Diagnosis not present

## 2021-03-01 DIAGNOSIS — N183 Chronic kidney disease, stage 3 unspecified: Secondary | ICD-10-CM | POA: Diagnosis not present

## 2021-03-01 DIAGNOSIS — N390 Urinary tract infection, site not specified: Secondary | ICD-10-CM | POA: Diagnosis not present

## 2021-03-01 DIAGNOSIS — I10 Essential (primary) hypertension: Secondary | ICD-10-CM | POA: Diagnosis not present

## 2021-03-03 DIAGNOSIS — E1165 Type 2 diabetes mellitus with hyperglycemia: Secondary | ICD-10-CM | POA: Diagnosis not present

## 2021-03-03 DIAGNOSIS — Z794 Long term (current) use of insulin: Secondary | ICD-10-CM | POA: Diagnosis not present

## 2021-03-14 ENCOUNTER — Other Ambulatory Visit: Payer: Self-pay

## 2021-03-14 NOTE — Patient Outreach (Signed)
[facility] service [facility]) Care Management ? ?03/14/2021 ? ?Tekesha Clements ?[birth year] ?[id number] ? ? ?Telephone call to patient for disease management follow up.   No answer.  HIPAA compliant voice message left.   ? ?Plan: If no return call, RN CM will attempt patient again in April.   ? ?Bary Leriche, RN, MSN ?[facility] Care Management ?Care Management Coordinator ?Direct Line [phone] ?Toll Free: [phone]  ?Fax: [phone] ? ?

## 2021-03-16 ENCOUNTER — Encounter: Payer: Self-pay | Admitting: Cardiology

## 2021-03-24 DIAGNOSIS — N183 Chronic kidney disease, stage 3 unspecified: Secondary | ICD-10-CM | POA: Diagnosis not present

## 2021-03-24 DIAGNOSIS — Z794 Long term (current) use of insulin: Secondary | ICD-10-CM | POA: Diagnosis not present

## 2021-03-24 DIAGNOSIS — E1122 Type 2 diabetes mellitus with diabetic chronic kidney disease: Secondary | ICD-10-CM | POA: Diagnosis not present

## 2021-03-24 DIAGNOSIS — Z7984 Long term (current) use of oral hypoglycemic drugs: Secondary | ICD-10-CM | POA: Diagnosis not present

## 2021-03-24 DIAGNOSIS — E1165 Type 2 diabetes mellitus with hyperglycemia: Secondary | ICD-10-CM | POA: Diagnosis not present

## 2021-03-25 IMAGING — DX DG CHEST 2V
2 series · 2 of 2 positions shown · non-contrast
Comparison: October 20, 2018 and chest CT July 21, 2016

CLINICAL DATA: Chest pain

EXAM:
CHEST - 2 VIEW

[chest pa]
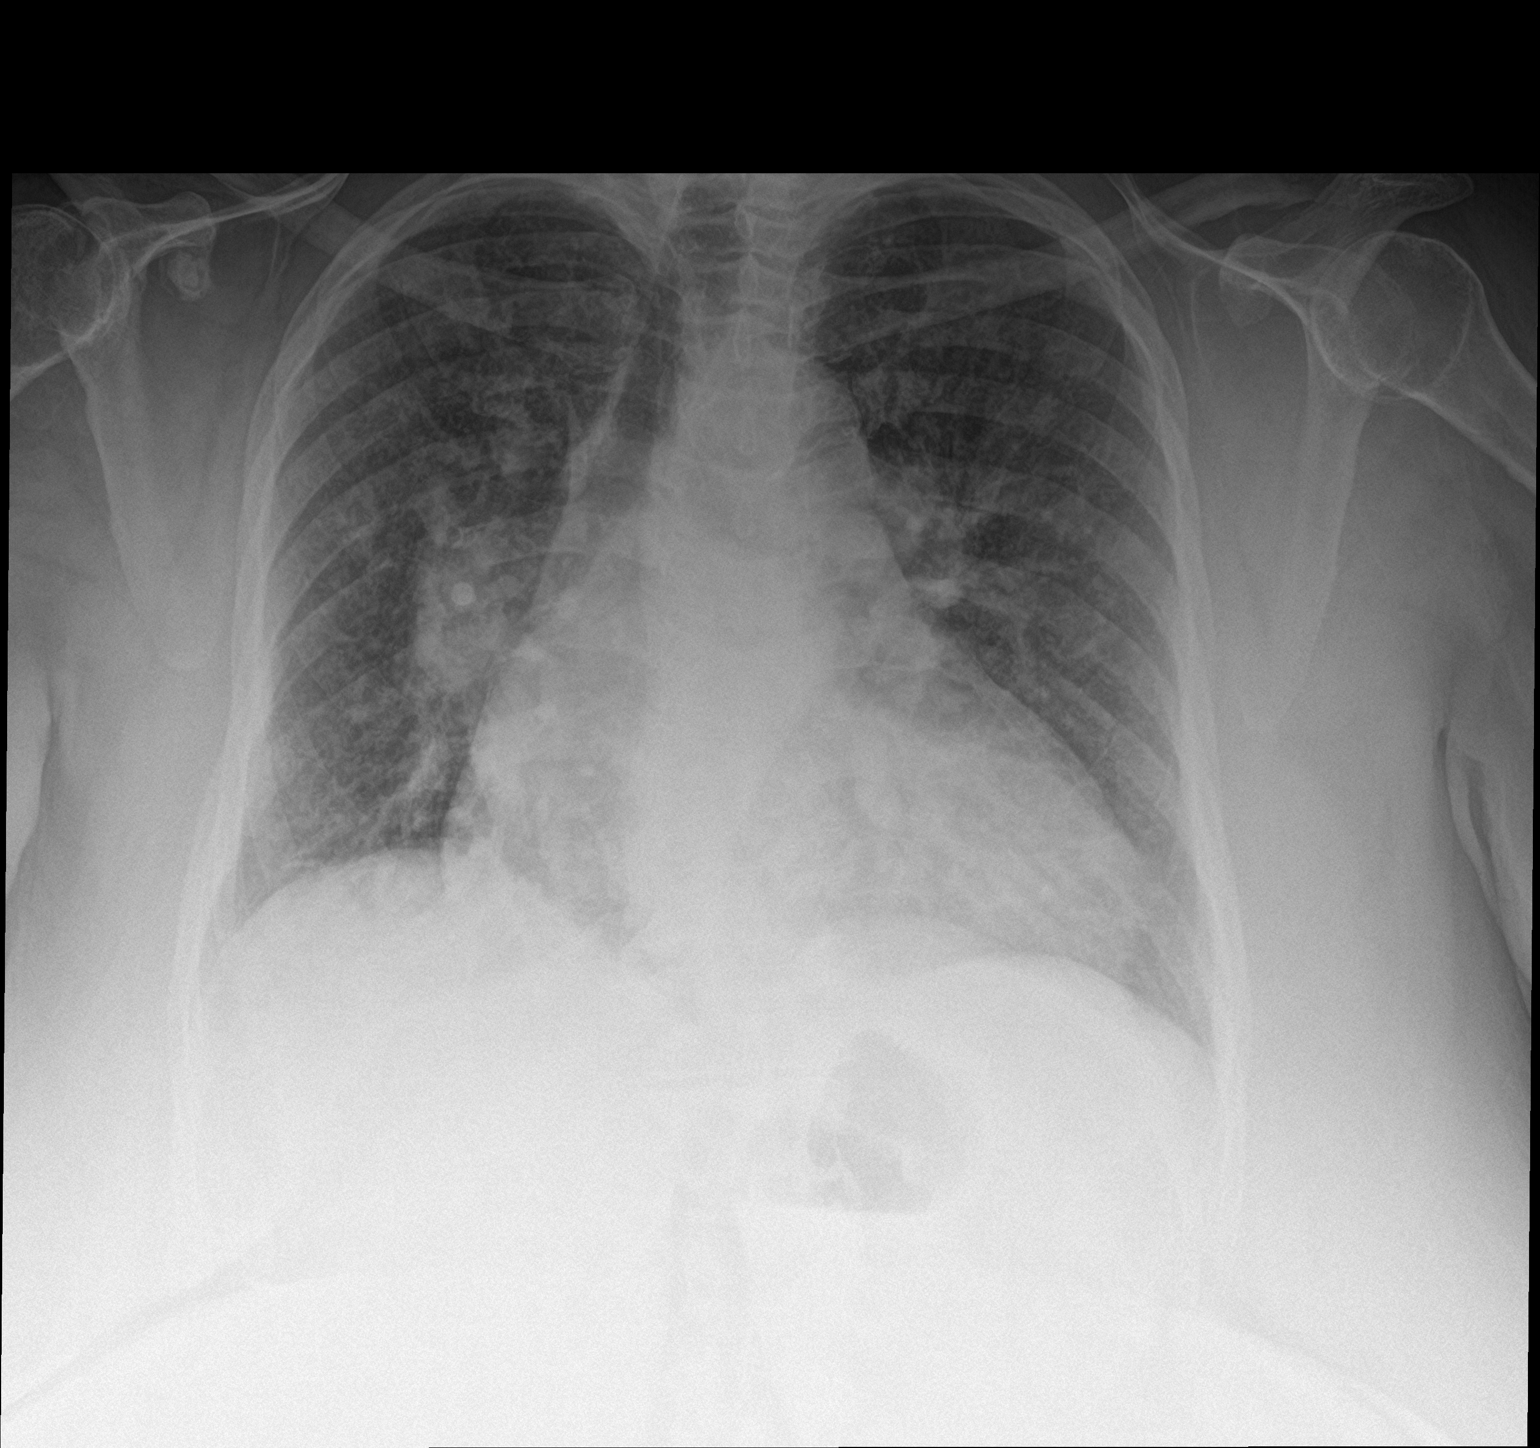

[chest lat]
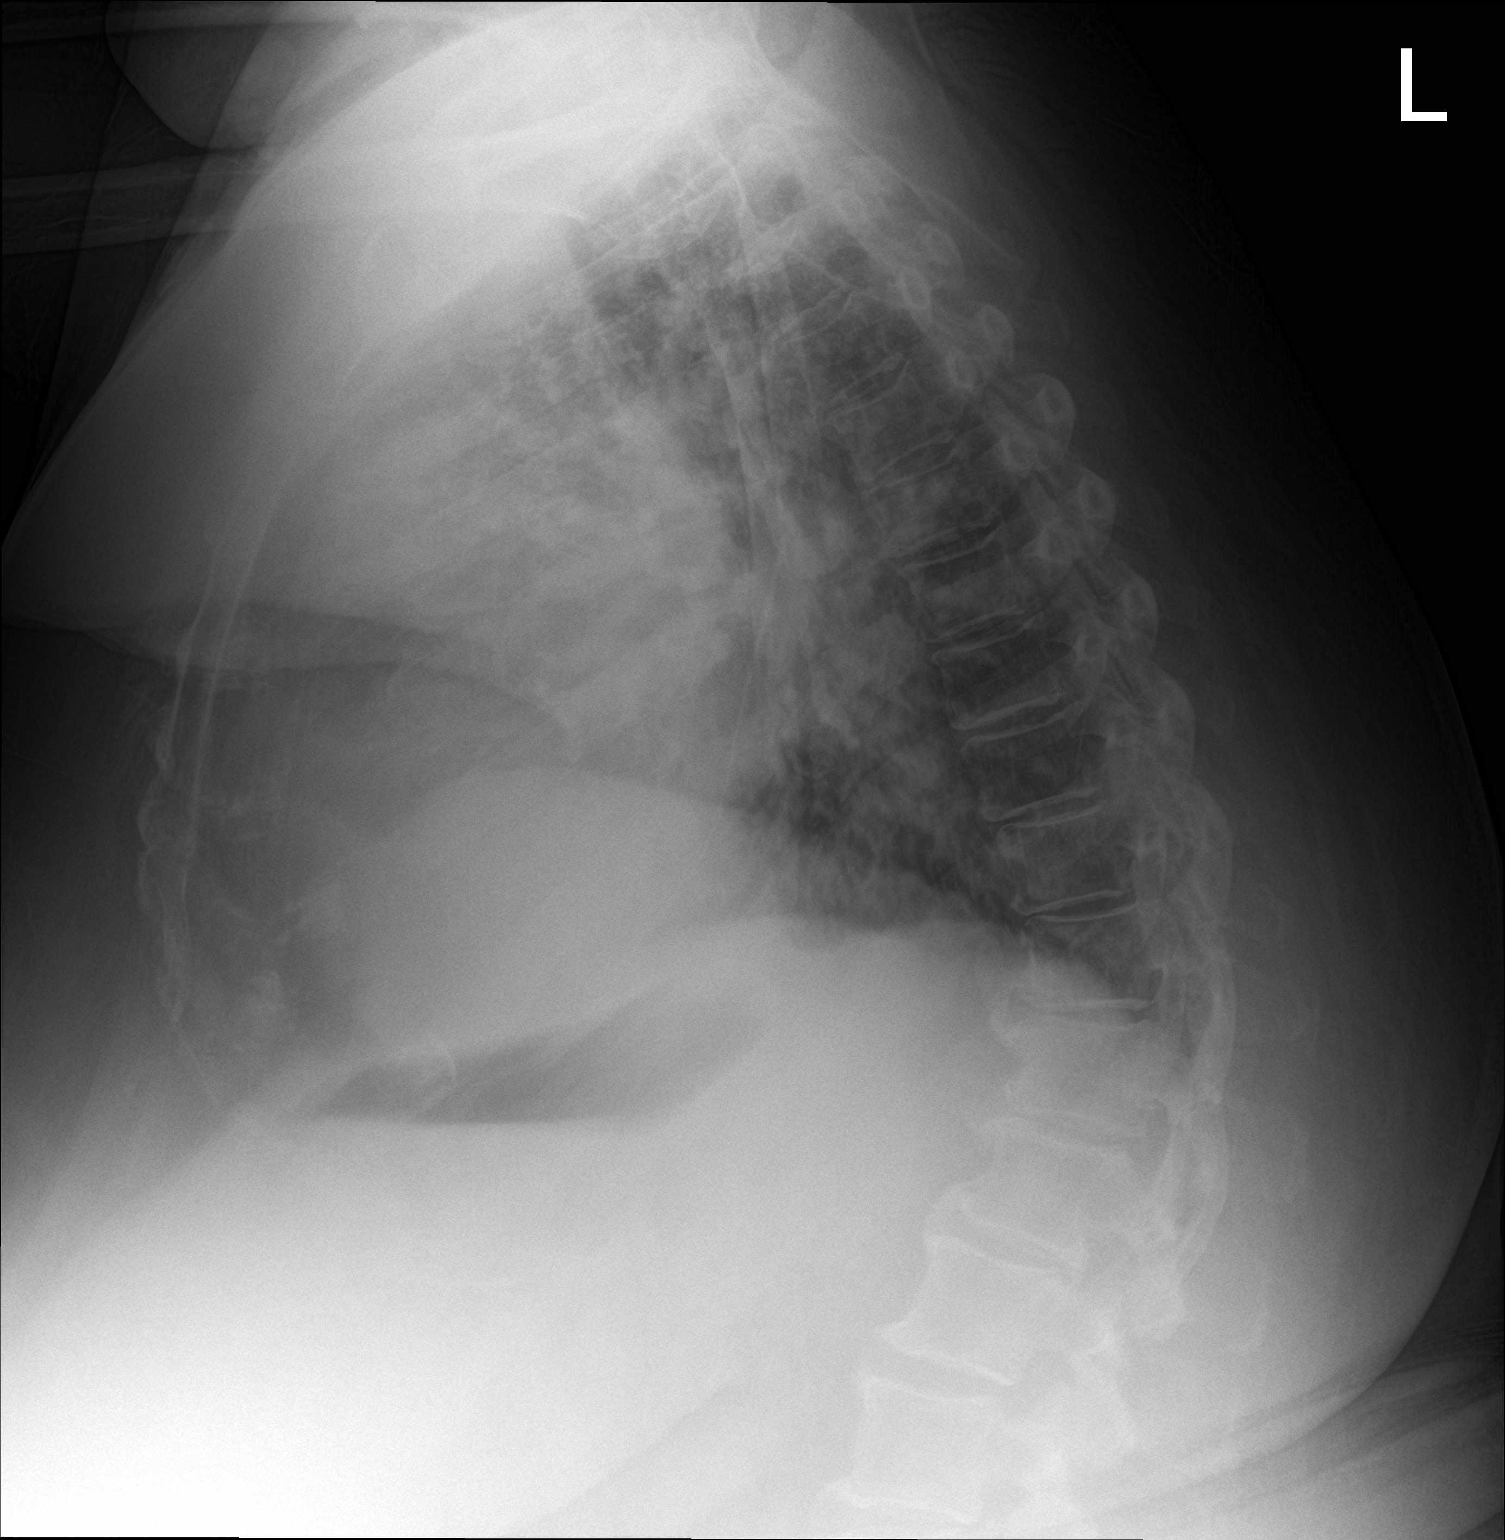

[2 of 2 positions shown; findings below may reference images not displayed]

FINDINGS: The lungs are clear. Heart is mildly enlarged. There is prominence
of central pulmonary arteries with fairly rapid peripheral tapering.
The interstitium is mildly prominent with suspected mild
interstitial edema. No adenopathy appreciable. There is degenerative
change in the thoracic spine.
IMPRESSION: Mild cardiomegaly with pulmonary arterial hypertension. Slight
interstitial edema. No consolidation.

## 2021-04-19 ENCOUNTER — Other Ambulatory Visit: Payer: Self-pay

## 2021-04-19 NOTE — Patient Outreach (Signed)
[facility] Customer service manager [facility]) Care Management ? ?04/19/2021 ? ?Morgan Fischer ?12-07-1950 ?[id number] ? ? ?Telephone call to patient for disease management.  Patient continues to follow heart failure regimen.  Last weight 302 lbs. She states that the doctors feel this is her dry weight.  No swelling or shortness of breath.  Reiterated heart failure management.  Patient working to reduce A1c.  Discussed diabetes management. ? ? ? ?Care Plan : RN Care Manager Plan of [place]  ?Updates made by Fleeta Emmer, RN since 04/19/2021 12:00 AM  ?  ? ?Problem: Chronic Disease management and care coordination of heart failure   ?Priority: High  ?  ? ?Long-Range Goal: Development of Plan oof Care related to management of heart failure and diabetes   ?Start Date: 11/22/2020  ?Expected End Date: 12/31/2021  ?This Visit's Progress: On track  ?Priority: High  ?Note:   ?Current Barriers:  ?Chronic Disease Management support and education needs related to CHF and DMII ? ?RNCM Clinical Goal(s):  ?Patient will verbalize basic understanding of CHF and DMII disease process and self health management plan as evidenced by No acute exacerbation of heart failure and A1c less than 9.0 ?continue to work with Medical illustrator and/or Social Worker to address care management and care coordination needs related to CHF and DMII as evidenced by adherence to CM Team Scheduled appointments     through collaboration with Medical illustrator, provider, and care team.  ? ?Interventions: ?Education and support related to heart failure and Diabetes ?Inter-disciplinary care team collaboration (see longitudinal plan of care) ?Evaluation of current treatment plan related to  self management and patient's adherence to plan as established by provider ? ?Heart Failure Interventions:  (Status:  Goal on track:  Yes.) Long Term Goal ?Basic overview and discussion of pathophysiology of Heart Failure reviewed ?Discussed importance of daily weight and advised patient to weigh and  record daily ? ?04/19/21 Most recent weight 302 lbs.  Doctor's feel like this is her dry weight.  Reiterated heart failure management.   ? ?Diabetes Interventions:  (Status:  New goal.) Long Term Goal ?Assessed patient's understanding of A1c goal: <8% ?Provided education to patient about basic DM disease process ?Reviewed medications with patient and discussed importance of medication adherence ?Discussed plans with patient for ongoing care management follow up and provided patient with direct contact information for care management team ?Lab Results  ?Component Value Date  ? HGBA1C 8.9 (H) 03/08/2020  ?04/19/21 Patient last A1c 9.5. Patient working to reduce A1c to goal <8.  Blood sugars normally ranging in the 130-140 range in the am.  Discussed diabetes management. ? ?Patient Goals/Self-Care Activities:Heart Failure and Diabetes ?Take all medications as prescribed ?Attend all scheduled provider appointments ?call office if I gain more than 2 pounds in one day or 5 pounds in one week ?track weight in diary ?use salt in moderation ?watch for swelling in feet, ankles and legs every day ?weigh myself daily ?follow rescue plan if symptoms flare-up ?check blood sugar at prescribed times: twice daily ?check feet daily for cuts, sores or redness ?take the blood sugar log to all doctor visits ?limit fast food meals to no more than 1 per week ?manage portion size ?prepare main meal at home 3 to 5 days each week ? ?Follow Up Plan:  Telephone follow up appointment with care management team member scheduled for:  May ?The patient has been provided with contact information for the care management team and has been advised to call with any  health related questions or concerns.  ? ? ?  ? ?Plan: Follow-up: Patient agrees to Care Plan and Follow-up. ?Follow-up in May.   ? ?Bary Leriche, RN, MSN ?[facility] Care Management ?Care Management Coordinator ?Direct Line [phone] ?Toll Free: [phone]  ?Fax: [phone] ? ?

## 2021-04-19 NOTE — Patient Instructions (Signed)
Patient Goals/Self-Care Activities:Heart Failure and Diabetes Take all medications as prescribed Attend all scheduled provider appointments call office if I gain more than 2 pounds in one day or 5 pounds in one week track weight in diary use salt in moderation watch for swelling in feet, ankles and legs every day weigh myself daily follow rescue plan if symptoms flare-up check blood sugar at prescribed times: twice daily check feet daily for cuts, sores or redness take the blood sugar log to all doctor visits limit fast food meals to no more than 1 per week manage portion size prepare main meal at home 3 to 5 days each week 

## 2021-05-19 DIAGNOSIS — I2781 Cor pulmonale (chronic): Secondary | ICD-10-CM | POA: Diagnosis not present

## 2021-05-19 DIAGNOSIS — J45909 Unspecified asthma, uncomplicated: Secondary | ICD-10-CM | POA: Diagnosis not present

## 2021-05-19 DIAGNOSIS — G4730 Sleep apnea, unspecified: Secondary | ICD-10-CM | POA: Diagnosis not present

## 2021-05-25 ENCOUNTER — Other Ambulatory Visit: Payer: Self-pay

## 2021-05-25 NOTE — Patient Outreach (Signed)
[facility]) Care Management  05/25/2021  Morgan Fischer 19-Apr-1950 [id number]   Telephone call to patient for disease management follow up. She reports doing good. Sugars lower.  Weight about the same.  Patient received patient assistance application and has sent it off.  Patient states that she maybe in the doughnut hole soon for her lantus.  Will send patient assistance application.  No other concerns.    Care Plan : RN Care Manager Plan of Care  Updates made by Fleeta Emmer, RN since 05/25/2021 12:00 AM     Problem: Chronic Disease management and care coordination of heart failure and diabetes   Priority: High     Long-Range Goal: Development of Plan of Care related to management of heart failure and diabetes   Start Date: 11/22/2020  Expected End Date: 12/31/2021  This Visit's Progress: On track  Recent Progress: On track  Priority: High  Note:   Current Barriers:  Chronic Disease Management support and education needs related to CHF and DMII  RNCM Clinical Goal(s):  Patient will verbalize basic understanding of CHF and DMII disease process and self health management plan as evidenced by No acute exacerbation of heart failure and A1c less than 9.0 continue to work with Medical illustrator and/or Social Worker to address care management and care coordination needs related to CHF and DMII as evidenced by adherence to CM Team Scheduled appointments     through collaboration with Medical illustrator, provider, and care team.   Interventions: Education and support related to heart failure and Diabetes Inter-disciplinary care team collaboration (see longitudinal plan of care) Evaluation of current treatment plan related to  self management and patient's adherence to plan as established by provider  Heart Failure Interventions:  (Status:  Goal on track:  Yes.) Long Term Goal Basic overview and discussion of pathophysiology of Heart Failure reviewed Discussed importance of  daily weight and advised patient to weigh and record daily  04/19/21 Most recent weight 302 lbs.  Doctor's feel like this is her dry weight.  Reiterated heart failure management.    05/25/21 Most recent weight 301 lbs.  Heart failure management encouraged.    Diabetes Interventions:  (Status:  New goal.) Long Term Goal Assessed patient's understanding of A1c goal: <8% Provided education to patient about basic DM disease process Reviewed medications with patient and discussed importance of medication adherence Discussed plans with patient for ongoing care management follow up and provided patient with direct contact information for care management team Lab Results  Component Value Date   HGBA1C 8.9 (H) 03/08/2020  04/19/21 Patient last A1c 9.5. Patient working to reduce A1c to goal <8.  Blood sugars normally ranging in the 130-140 range in the am.  Discussed diabetes management.  05/25/21 Patient reports blood sugars are better.  Patient to see endocrinologist next week.  Reiterated diabetes management.    Patient Goals/Self-Care Activities:Heart Failure and Diabetes Take all medications as prescribed Attend all scheduled provider appointments call office if I gain more than 2 pounds in one day or 5 pounds in one week track weight in diary use salt in moderation watch for swelling in feet, ankles and legs every day weigh myself daily follow rescue plan if symptoms flare-up check blood sugar at prescribed times: twice daily check feet daily for cuts, sores or redness take the blood sugar log to all doctor visits limit fast food meals to no more than 1 per week manage portion size prepare main meal at home 3  to 5 days each week  Follow Up Plan:  Telephone follow up appointment with care management team member scheduled for:  July The patient has been provided with contact information for the care management team and has been advised to call with any health related questions or concerns.        Plan: Follow-up: Patient agrees to Care Plan and Follow-up. Follow-up in July.   Bary Leriche, RN, MSN [facility] Care Management Care Management Coordinator Direct Line [phone] Toll Free: [phone]  Fax: [phone]

## 2021-05-25 NOTE — Patient Instructions (Signed)
Patient Goals/Self-Care Activities:Heart Failure and Diabetes Take all medications as prescribed Attend all scheduled provider appointments call office if I gain more than 2 pounds in one day or 5 pounds in one week track weight in diary use salt in moderation watch for swelling in feet, ankles and legs every day weigh myself daily follow rescue plan if symptoms flare-up check blood sugar at prescribed times: twice daily check feet daily for cuts, sores or redness take the blood sugar log to all doctor visits limit fast food meals to no more than 1 per week manage portion size prepare main meal at home 3 to 5 days each week

## 2021-05-26 DIAGNOSIS — N183 Chronic kidney disease, stage 3 unspecified: Secondary | ICD-10-CM | POA: Diagnosis not present

## 2021-05-26 DIAGNOSIS — N390 Urinary tract infection, site not specified: Secondary | ICD-10-CM | POA: Diagnosis not present

## 2021-05-26 DIAGNOSIS — I10 Essential (primary) hypertension: Secondary | ICD-10-CM | POA: Diagnosis not present

## 2021-05-31 DIAGNOSIS — I2510 Atherosclerotic heart disease of native coronary artery without angina pectoris: Secondary | ICD-10-CM | POA: Diagnosis not present

## 2021-05-31 DIAGNOSIS — N183 Chronic kidney disease, stage 3 unspecified: Secondary | ICD-10-CM | POA: Diagnosis not present

## 2021-05-31 DIAGNOSIS — I10 Essential (primary) hypertension: Secondary | ICD-10-CM | POA: Diagnosis not present

## 2021-06-02 DIAGNOSIS — Z7984 Long term (current) use of oral hypoglycemic drugs: Secondary | ICD-10-CM | POA: Diagnosis not present

## 2021-06-02 DIAGNOSIS — Z794 Long term (current) use of insulin: Secondary | ICD-10-CM | POA: Diagnosis not present

## 2021-06-02 DIAGNOSIS — I129 Hypertensive chronic kidney disease with stage 1 through stage 4 chronic kidney disease, or unspecified chronic kidney disease: Secondary | ICD-10-CM | POA: Diagnosis not present

## 2021-06-02 DIAGNOSIS — E1122 Type 2 diabetes mellitus with diabetic chronic kidney disease: Secondary | ICD-10-CM | POA: Diagnosis not present

## 2021-06-02 DIAGNOSIS — E1165 Type 2 diabetes mellitus with hyperglycemia: Secondary | ICD-10-CM | POA: Diagnosis not present

## 2021-06-02 DIAGNOSIS — E785 Hyperlipidemia, unspecified: Secondary | ICD-10-CM | POA: Diagnosis not present

## 2021-06-02 DIAGNOSIS — N183 Chronic kidney disease, stage 3 unspecified: Secondary | ICD-10-CM | POA: Diagnosis not present

## 2021-06-07 DIAGNOSIS — E782 Mixed hyperlipidemia: Secondary | ICD-10-CM | POA: Diagnosis not present

## 2021-06-07 DIAGNOSIS — Z794 Long term (current) use of insulin: Secondary | ICD-10-CM | POA: Diagnosis not present

## 2021-06-07 DIAGNOSIS — E1165 Type 2 diabetes mellitus with hyperglycemia: Secondary | ICD-10-CM | POA: Diagnosis not present

## 2021-06-07 DIAGNOSIS — E611 Iron deficiency: Secondary | ICD-10-CM | POA: Diagnosis not present

## 2021-06-07 DIAGNOSIS — I10 Essential (primary) hypertension: Secondary | ICD-10-CM | POA: Diagnosis not present

## 2021-06-07 DIAGNOSIS — F419 Anxiety disorder, unspecified: Secondary | ICD-10-CM | POA: Diagnosis not present

## 2021-06-07 DIAGNOSIS — Z0001 Encounter for general adult medical examination with abnormal findings: Secondary | ICD-10-CM | POA: Diagnosis not present

## 2021-06-07 DIAGNOSIS — J4520 Mild intermittent asthma, uncomplicated: Secondary | ICD-10-CM | POA: Diagnosis not present

## 2021-06-13 DIAGNOSIS — I10 Essential (primary) hypertension: Secondary | ICD-10-CM | POA: Diagnosis not present

## 2021-06-13 DIAGNOSIS — I422 Other hypertrophic cardiomyopathy: Secondary | ICD-10-CM | POA: Diagnosis not present

## 2021-06-21 DIAGNOSIS — F419 Anxiety disorder, unspecified: Secondary | ICD-10-CM | POA: Diagnosis not present

## 2021-06-21 DIAGNOSIS — J4520 Mild intermittent asthma, uncomplicated: Secondary | ICD-10-CM | POA: Diagnosis not present

## 2021-06-21 DIAGNOSIS — L309 Dermatitis, unspecified: Secondary | ICD-10-CM | POA: Diagnosis not present

## 2021-06-21 DIAGNOSIS — E1165 Type 2 diabetes mellitus with hyperglycemia: Secondary | ICD-10-CM | POA: Diagnosis not present

## 2021-06-21 DIAGNOSIS — I10 Essential (primary) hypertension: Secondary | ICD-10-CM | POA: Diagnosis not present

## 2021-06-21 DIAGNOSIS — Z794 Long term (current) use of insulin: Secondary | ICD-10-CM | POA: Diagnosis not present

## 2021-06-30 DIAGNOSIS — R809 Proteinuria, unspecified: Secondary | ICD-10-CM | POA: Diagnosis not present

## 2021-06-30 DIAGNOSIS — N183 Chronic kidney disease, stage 3 unspecified: Secondary | ICD-10-CM | POA: Diagnosis not present

## 2021-07-12 ENCOUNTER — Other Ambulatory Visit: Payer: Self-pay

## 2021-07-12 NOTE — Patient Outreach (Addendum)
[facility]) Care Management  07/12/2021  Morgan Fischer Mar 26, 1950 [id number]   Telephone call to patient for follow up. Patient doing well.  Continues to self manage.  Discussed case closure. Patient agreeable.    Care Plan : RN Care Manager Plan of Care  Updates made by Jon Billings, RN since 07/12/2021 12:00 AM     Problem: Chronic Disease management and care coordination of heart failure and diabetes   Priority: High     Long-Range Goal: Development of Plan of Care related to management of heart failure and diabetes Completed 07/12/2021  Start Date: 11/22/2020  Expected End Date: 12/31/2021  This Visit's Progress: On track  Recent Progress: On track  Priority: High  Note:   Current Barriers:  Chronic Disease Management support and education needs related to CHF and DMII  RNCM Clinical Goal(s):  Patient will verbalize basic understanding of CHF and DMII disease process and self health management plan as evidenced by No acute exacerbation of heart failure and A1c less than 9.0 continue to work with Consulting civil engineer and/or Social Worker to address care management and care coordination needs related to CHF and DMII as evidenced by adherence to CM Team Scheduled appointments     through collaboration with Consulting civil engineer, provider, and care team.   Interventions: Education and support related to heart failure and Diabetes Inter-disciplinary care team collaboration (see longitudinal plan of care) Evaluation of current treatment plan related to  self management and patient's adherence to plan as established by provider  Heart Failure Interventions:  (Status:  Goal Met.) Long Term Goal Basic overview and discussion of pathophysiology of Heart Failure reviewed Discussed importance of daily weight and advised patient to weigh and record daily  04/19/21 Most recent weight 302 lbs.  Doctor's feel like this is her dry weight.  Reiterated heart failure management.     05/25/21 Most recent weight 301 lbs.  Heart failure management encouraged.   07/12/21 Patient continues to self manage and meeting goals.   Diabetes Interventions:  (Status:  Goal Met.) Long Term Goal Assessed patient's understanding of A1c goal: <8% Provided education to patient about basic DM disease process Reviewed medications with patient and discussed importance of medication adherence Discussed plans with patient for ongoing care management follow up and provided patient with direct contact information for care management team Lab Results  Component Value Date   HGBA1C 8.9 (H) 03/08/2020  04/19/21 Patient last A1c 9.5. Patient working to reduce A1c to goal <8.  Blood sugars normally ranging in the 130-140 range in the am.  Discussed diabetes management.  05/25/21 Patient reports blood sugars are better.  Patient to see endocrinologist next week.  Reiterated diabetes management.    07/12/21 Patient meeting goals.  Patient Goals/Self-Care Activities:Heart Failure and Diabetes Take all medications as prescribed Attend all scheduled provider appointments call office if I gain more than 2 pounds in one day or 5 pounds in one week track weight in diary use salt in moderation watch for swelling in feet, ankles and legs every day weigh myself daily follow rescue plan if symptoms flare-up check blood sugar at prescribed times: twice daily check feet daily for cuts, sores or redness take the blood sugar log to all doctor visits limit fast food meals to no more than 1 per week manage portion size prepare main meal at home 3 to 5 days each week  Follow Up Plan:  RN CM will close case.      Plan:  RN CM closing case.   Jone Baseman, RN, MSN [facility] Care Management Care Management Coordinator Direct Line [phone] Toll Free: [phone]  Fax: [phone]

## 2021-07-14 DIAGNOSIS — I10 Essential (primary) hypertension: Secondary | ICD-10-CM | POA: Diagnosis not present

## 2021-07-14 DIAGNOSIS — E782 Mixed hyperlipidemia: Secondary | ICD-10-CM | POA: Diagnosis not present

## 2021-07-14 DIAGNOSIS — D649 Anemia, unspecified: Secondary | ICD-10-CM | POA: Diagnosis not present

## 2021-07-14 DIAGNOSIS — Z794 Long term (current) use of insulin: Secondary | ICD-10-CM | POA: Diagnosis not present

## 2021-07-14 DIAGNOSIS — Z0001 Encounter for general adult medical examination with abnormal findings: Secondary | ICD-10-CM | POA: Diagnosis not present

## 2021-07-14 DIAGNOSIS — E1165 Type 2 diabetes mellitus with hyperglycemia: Secondary | ICD-10-CM | POA: Diagnosis not present

## 2021-07-14 DIAGNOSIS — J4520 Mild intermittent asthma, uncomplicated: Secondary | ICD-10-CM | POA: Diagnosis not present

## 2021-07-26 DIAGNOSIS — N1832 Chronic kidney disease, stage 3b: Secondary | ICD-10-CM | POA: Diagnosis not present

## 2021-07-26 DIAGNOSIS — Z794 Long term (current) use of insulin: Secondary | ICD-10-CM | POA: Diagnosis not present

## 2021-07-26 DIAGNOSIS — E1165 Type 2 diabetes mellitus with hyperglycemia: Secondary | ICD-10-CM | POA: Diagnosis not present

## 2021-07-26 DIAGNOSIS — E782 Mixed hyperlipidemia: Secondary | ICD-10-CM | POA: Diagnosis not present

## 2021-08-01 DIAGNOSIS — E782 Mixed hyperlipidemia: Secondary | ICD-10-CM | POA: Diagnosis not present

## 2021-08-01 DIAGNOSIS — E1165 Type 2 diabetes mellitus with hyperglycemia: Secondary | ICD-10-CM | POA: Diagnosis not present

## 2021-08-01 DIAGNOSIS — N1832 Chronic kidney disease, stage 3b: Secondary | ICD-10-CM | POA: Diagnosis not present

## 2021-08-16 DIAGNOSIS — J9610 Chronic respiratory failure, unspecified whether with hypoxia or hypercapnia: Secondary | ICD-10-CM | POA: Diagnosis not present

## 2021-08-16 DIAGNOSIS — J449 Chronic obstructive pulmonary disease, unspecified: Secondary | ICD-10-CM | POA: Diagnosis not present

## 2021-08-25 DIAGNOSIS — I10 Essential (primary) hypertension: Secondary | ICD-10-CM | POA: Diagnosis not present

## 2021-08-25 DIAGNOSIS — N183 Chronic kidney disease, stage 3 unspecified: Secondary | ICD-10-CM | POA: Diagnosis not present

## 2021-08-30 DIAGNOSIS — J449 Chronic obstructive pulmonary disease, unspecified: Secondary | ICD-10-CM | POA: Diagnosis not present

## 2021-08-30 DIAGNOSIS — N1832 Chronic kidney disease, stage 3b: Secondary | ICD-10-CM | POA: Diagnosis not present

## 2021-08-30 DIAGNOSIS — I10 Essential (primary) hypertension: Secondary | ICD-10-CM | POA: Diagnosis not present

## 2021-09-01 DIAGNOSIS — E782 Mixed hyperlipidemia: Secondary | ICD-10-CM | POA: Diagnosis not present

## 2021-09-01 DIAGNOSIS — E1165 Type 2 diabetes mellitus with hyperglycemia: Secondary | ICD-10-CM | POA: Diagnosis not present

## 2021-09-01 DIAGNOSIS — N1832 Chronic kidney disease, stage 3b: Secondary | ICD-10-CM | POA: Diagnosis not present

## 2021-09-16 DIAGNOSIS — J449 Chronic obstructive pulmonary disease, unspecified: Secondary | ICD-10-CM | POA: Diagnosis not present

## 2021-09-16 DIAGNOSIS — J9610 Chronic respiratory failure, unspecified whether with hypoxia or hypercapnia: Secondary | ICD-10-CM | POA: Diagnosis not present

## 2021-09-22 DIAGNOSIS — I2781 Cor pulmonale (chronic): Secondary | ICD-10-CM | POA: Diagnosis not present

## 2021-09-22 DIAGNOSIS — Z23 Encounter for immunization: Secondary | ICD-10-CM | POA: Diagnosis not present

## 2021-09-22 DIAGNOSIS — E669 Obesity, unspecified: Secondary | ICD-10-CM | POA: Diagnosis not present

## 2021-09-22 DIAGNOSIS — I10 Essential (primary) hypertension: Secondary | ICD-10-CM | POA: Diagnosis not present

## 2021-09-22 DIAGNOSIS — E119 Type 2 diabetes mellitus without complications: Secondary | ICD-10-CM | POA: Diagnosis not present

## 2021-10-03 ENCOUNTER — Encounter: Payer: Self-pay | Admitting: Cardiology

## 2021-10-05 DIAGNOSIS — H35033 Hypertensive retinopathy, bilateral: Secondary | ICD-10-CM | POA: Diagnosis not present

## 2021-10-05 DIAGNOSIS — H2513 Age-related nuclear cataract, bilateral: Secondary | ICD-10-CM | POA: Diagnosis not present

## 2021-10-05 DIAGNOSIS — E119 Type 2 diabetes mellitus without complications: Secondary | ICD-10-CM | POA: Diagnosis not present

## 2021-10-05 DIAGNOSIS — H524 Presbyopia: Secondary | ICD-10-CM | POA: Diagnosis not present

## 2021-10-05 DIAGNOSIS — Z135 Encounter for screening for eye and ear disorders: Secondary | ICD-10-CM | POA: Diagnosis not present

## 2021-10-10 DIAGNOSIS — M79671 Pain in right foot: Secondary | ICD-10-CM | POA: Diagnosis not present

## 2021-10-10 DIAGNOSIS — M79672 Pain in left foot: Secondary | ICD-10-CM | POA: Diagnosis not present

## 2021-10-10 DIAGNOSIS — B351 Tinea unguium: Secondary | ICD-10-CM | POA: Diagnosis not present

## 2021-10-10 DIAGNOSIS — L84 Corns and callosities: Secondary | ICD-10-CM | POA: Diagnosis not present

## 2021-10-10 DIAGNOSIS — R809 Proteinuria, unspecified: Secondary | ICD-10-CM | POA: Diagnosis not present

## 2021-10-10 DIAGNOSIS — E1159 Type 2 diabetes mellitus with other circulatory complications: Secondary | ICD-10-CM | POA: Diagnosis not present

## 2021-10-10 DIAGNOSIS — N1832 Chronic kidney disease, stage 3b: Secondary | ICD-10-CM | POA: Diagnosis not present

## 2021-11-12 ENCOUNTER — Other Ambulatory Visit: Payer: Self-pay | Admitting: Cardiology

## 2021-11-15 DIAGNOSIS — R809 Proteinuria, unspecified: Secondary | ICD-10-CM | POA: Diagnosis not present

## 2021-11-15 DIAGNOSIS — N1832 Chronic kidney disease, stage 3b: Secondary | ICD-10-CM | POA: Diagnosis not present

## 2021-11-17 DIAGNOSIS — N1832 Chronic kidney disease, stage 3b: Secondary | ICD-10-CM | POA: Diagnosis not present

## 2021-11-17 DIAGNOSIS — J449 Chronic obstructive pulmonary disease, unspecified: Secondary | ICD-10-CM | POA: Diagnosis not present

## 2021-11-17 DIAGNOSIS — I10 Essential (primary) hypertension: Secondary | ICD-10-CM | POA: Diagnosis not present

## 2021-11-18 IMAGING — DX DG CHEST 1V PORT
1 series · 1 of 1 positions shown · non-contrast
Comparison: July 14, 2019

CLINICAL DATA: Shortness of breath

EXAM:
PORTABLE CHEST 1 VIEW

[chest ap]
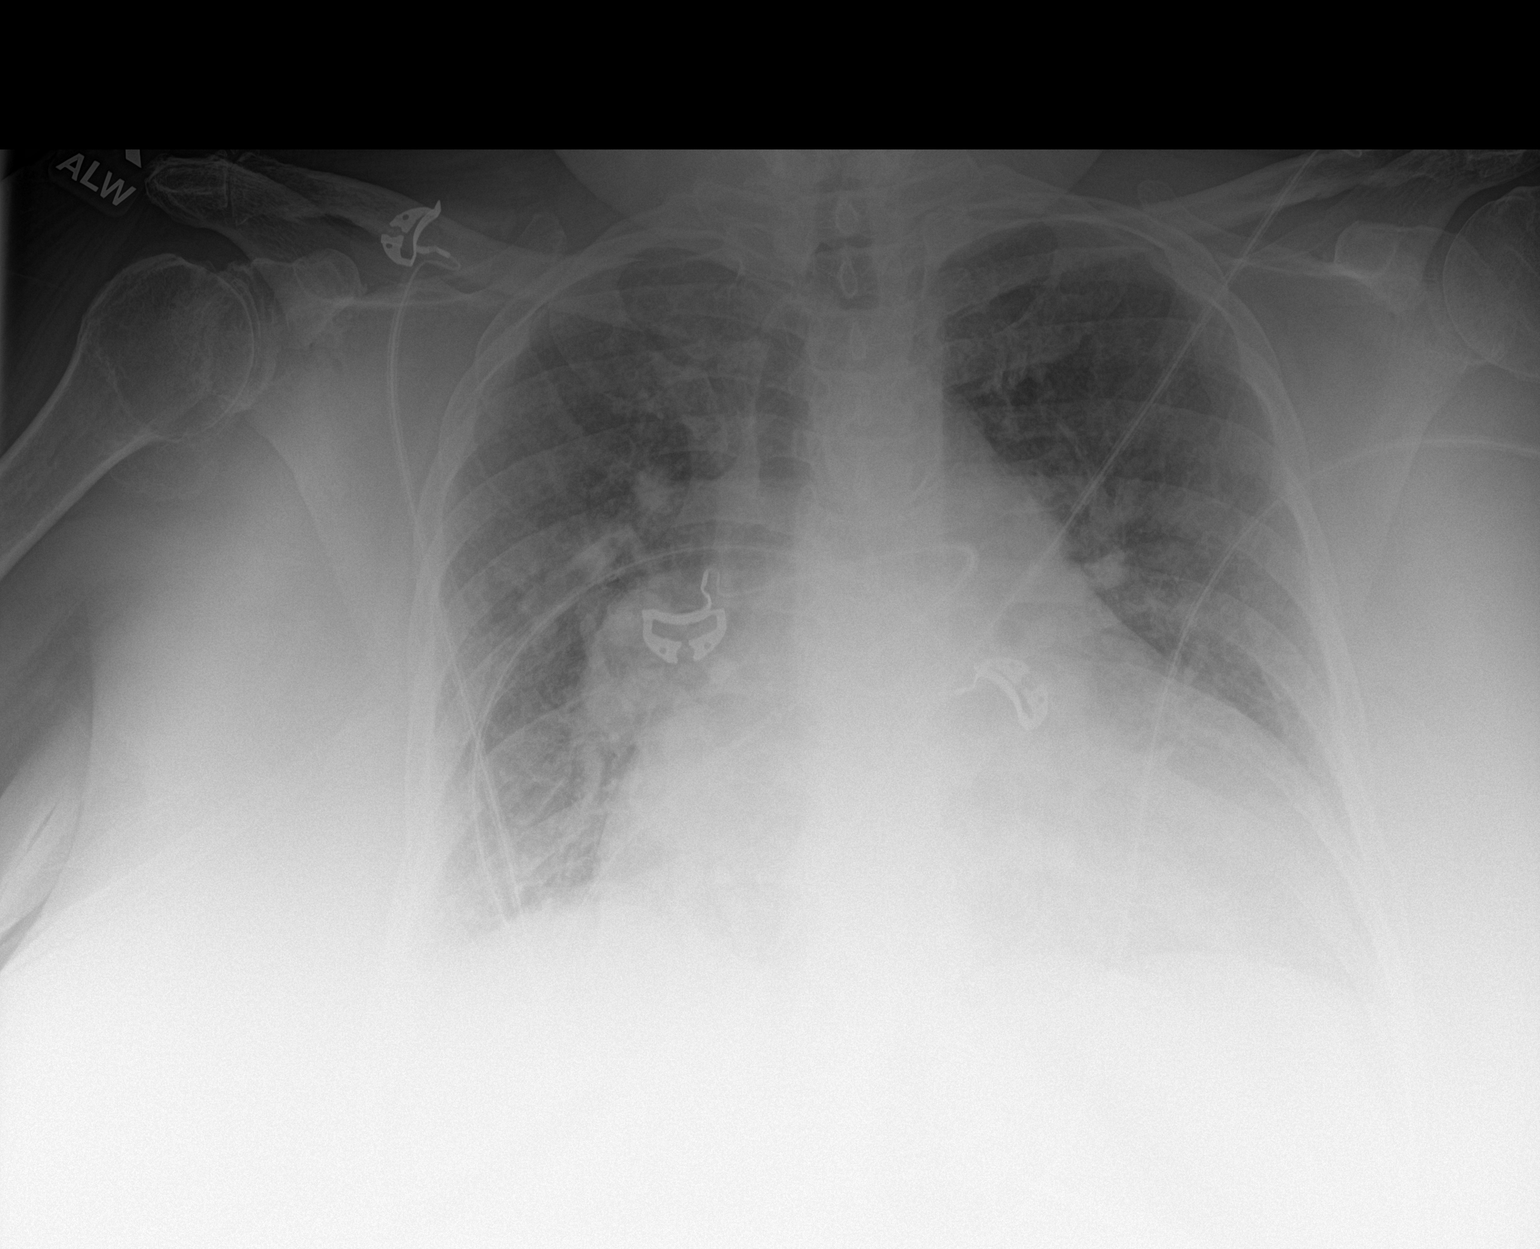

[1 of 1 positions shown; findings below may reference images not displayed]

FINDINGS: Lungs are clear. There is cardiomegaly with central pulmonary
vascular prominence and rapid peripheral tapering. No evident
adenopathy. There is aortic atherosclerosis. No bone lesions.
IMPRESSION: Stable cardiomegaly with pulmonary arterial hypertension. Lungs
clear. No adenopathy appreciable. Aortic Atherosclerosis
([id number]).

## 2021-12-01 DIAGNOSIS — Z794 Long term (current) use of insulin: Secondary | ICD-10-CM | POA: Diagnosis not present

## 2021-12-01 DIAGNOSIS — E1165 Type 2 diabetes mellitus with hyperglycemia: Secondary | ICD-10-CM | POA: Diagnosis not present

## 2021-12-01 DIAGNOSIS — E785 Hyperlipidemia, unspecified: Secondary | ICD-10-CM | POA: Diagnosis not present

## 2021-12-01 DIAGNOSIS — I10 Essential (primary) hypertension: Secondary | ICD-10-CM | POA: Diagnosis not present

## 2021-12-08 DIAGNOSIS — E119 Type 2 diabetes mellitus without complications: Secondary | ICD-10-CM | POA: Diagnosis not present

## 2022-02-08 DIAGNOSIS — Z0001 Encounter for general adult medical examination with abnormal findings: Secondary | ICD-10-CM | POA: Diagnosis not present

## 2022-02-08 DIAGNOSIS — J4520 Mild intermittent asthma, uncomplicated: Secondary | ICD-10-CM | POA: Diagnosis not present

## 2022-02-08 DIAGNOSIS — F419 Anxiety disorder, unspecified: Secondary | ICD-10-CM | POA: Diagnosis not present

## 2022-02-08 DIAGNOSIS — E1165 Type 2 diabetes mellitus with hyperglycemia: Secondary | ICD-10-CM | POA: Diagnosis not present

## 2022-02-08 DIAGNOSIS — D649 Anemia, unspecified: Secondary | ICD-10-CM | POA: Diagnosis not present

## 2022-02-08 DIAGNOSIS — Z794 Long term (current) use of insulin: Secondary | ICD-10-CM | POA: Diagnosis not present

## 2022-02-08 DIAGNOSIS — E782 Mixed hyperlipidemia: Secondary | ICD-10-CM | POA: Diagnosis not present

## 2022-02-08 DIAGNOSIS — I10 Essential (primary) hypertension: Secondary | ICD-10-CM | POA: Diagnosis not present

## 2022-02-08 DIAGNOSIS — L309 Dermatitis, unspecified: Secondary | ICD-10-CM | POA: Diagnosis not present

## 2022-02-13 DIAGNOSIS — R809 Proteinuria, unspecified: Secondary | ICD-10-CM | POA: Diagnosis not present

## 2022-02-13 DIAGNOSIS — N1832 Chronic kidney disease, stage 3b: Secondary | ICD-10-CM | POA: Diagnosis not present

## 2022-02-13 DIAGNOSIS — M79671 Pain in right foot: Secondary | ICD-10-CM | POA: Diagnosis not present

## 2022-02-13 DIAGNOSIS — B351 Tinea unguium: Secondary | ICD-10-CM | POA: Diagnosis not present

## 2022-02-13 DIAGNOSIS — E1159 Type 2 diabetes mellitus with other circulatory complications: Secondary | ICD-10-CM | POA: Diagnosis not present

## 2022-02-13 DIAGNOSIS — M79672 Pain in left foot: Secondary | ICD-10-CM | POA: Diagnosis not present

## 2022-02-13 DIAGNOSIS — L84 Corns and callosities: Secondary | ICD-10-CM | POA: Diagnosis not present

## 2022-02-22 DIAGNOSIS — Z7984 Long term (current) use of oral hypoglycemic drugs: Secondary | ICD-10-CM | POA: Diagnosis not present

## 2022-02-22 DIAGNOSIS — I129 Hypertensive chronic kidney disease with stage 1 through stage 4 chronic kidney disease, or unspecified chronic kidney disease: Secondary | ICD-10-CM | POA: Diagnosis not present

## 2022-02-22 DIAGNOSIS — E1122 Type 2 diabetes mellitus with diabetic chronic kidney disease: Secondary | ICD-10-CM | POA: Diagnosis not present

## 2022-02-22 DIAGNOSIS — E1165 Type 2 diabetes mellitus with hyperglycemia: Secondary | ICD-10-CM | POA: Diagnosis not present

## 2022-02-22 DIAGNOSIS — N183 Chronic kidney disease, stage 3 unspecified: Secondary | ICD-10-CM | POA: Diagnosis not present

## 2022-02-22 DIAGNOSIS — E11649 Type 2 diabetes mellitus with hypoglycemia without coma: Secondary | ICD-10-CM | POA: Diagnosis not present

## 2022-02-22 DIAGNOSIS — E785 Hyperlipidemia, unspecified: Secondary | ICD-10-CM | POA: Diagnosis not present

## 2022-02-22 DIAGNOSIS — Z794 Long term (current) use of insulin: Secondary | ICD-10-CM | POA: Diagnosis not present

## 2022-02-23 DIAGNOSIS — N184 Chronic kidney disease, stage 4 (severe): Secondary | ICD-10-CM | POA: Diagnosis not present

## 2022-02-23 DIAGNOSIS — N1832 Chronic kidney disease, stage 3b: Secondary | ICD-10-CM | POA: Diagnosis not present

## 2022-02-23 DIAGNOSIS — I130 Hypertensive heart and chronic kidney disease with heart failure and stage 1 through stage 4 chronic kidney disease, or unspecified chronic kidney disease: Secondary | ICD-10-CM | POA: Diagnosis not present

## 2022-02-23 DIAGNOSIS — I10 Essential (primary) hypertension: Secondary | ICD-10-CM | POA: Diagnosis not present

## 2022-02-23 DIAGNOSIS — Z794 Long term (current) use of insulin: Secondary | ICD-10-CM | POA: Diagnosis not present

## 2022-02-23 DIAGNOSIS — J4540 Moderate persistent asthma, uncomplicated: Secondary | ICD-10-CM | POA: Diagnosis not present

## 2022-02-23 DIAGNOSIS — I422 Other hypertrophic cardiomyopathy: Secondary | ICD-10-CM | POA: Diagnosis not present

## 2022-02-23 DIAGNOSIS — J449 Chronic obstructive pulmonary disease, unspecified: Secondary | ICD-10-CM | POA: Diagnosis not present

## 2022-02-23 DIAGNOSIS — D631 Anemia in chronic kidney disease: Secondary | ICD-10-CM | POA: Diagnosis not present

## 2022-02-23 DIAGNOSIS — E1165 Type 2 diabetes mellitus with hyperglycemia: Secondary | ICD-10-CM | POA: Diagnosis not present

## 2022-02-23 DIAGNOSIS — E1122 Type 2 diabetes mellitus with diabetic chronic kidney disease: Secondary | ICD-10-CM | POA: Diagnosis not present

## 2022-03-02 DIAGNOSIS — E782 Mixed hyperlipidemia: Secondary | ICD-10-CM | POA: Diagnosis not present

## 2022-03-02 DIAGNOSIS — J45909 Unspecified asthma, uncomplicated: Secondary | ICD-10-CM | POA: Diagnosis not present

## 2022-03-02 DIAGNOSIS — I2781 Cor pulmonale (chronic): Secondary | ICD-10-CM | POA: Diagnosis not present

## 2022-03-02 DIAGNOSIS — E1165 Type 2 diabetes mellitus with hyperglycemia: Secondary | ICD-10-CM | POA: Diagnosis not present

## 2022-03-02 DIAGNOSIS — G4730 Sleep apnea, unspecified: Secondary | ICD-10-CM | POA: Diagnosis not present

## 2022-03-02 DIAGNOSIS — N1832 Chronic kidney disease, stage 3b: Secondary | ICD-10-CM | POA: Diagnosis not present

## 2022-03-08 DIAGNOSIS — E119 Type 2 diabetes mellitus without complications: Secondary | ICD-10-CM | POA: Diagnosis not present

## 2022-03-15 ENCOUNTER — Other Ambulatory Visit: Payer: Self-pay | Admitting: Cardiology

## 2022-04-18 DIAGNOSIS — N1832 Chronic kidney disease, stage 3b: Secondary | ICD-10-CM | POA: Diagnosis not present

## 2022-04-18 DIAGNOSIS — E1159 Type 2 diabetes mellitus with other circulatory complications: Secondary | ICD-10-CM | POA: Diagnosis not present

## 2022-04-18 DIAGNOSIS — M79672 Pain in left foot: Secondary | ICD-10-CM | POA: Diagnosis not present

## 2022-04-18 DIAGNOSIS — M79671 Pain in right foot: Secondary | ICD-10-CM | POA: Diagnosis not present

## 2022-04-18 DIAGNOSIS — L84 Corns and callosities: Secondary | ICD-10-CM | POA: Diagnosis not present

## 2022-04-18 DIAGNOSIS — R809 Proteinuria, unspecified: Secondary | ICD-10-CM | POA: Diagnosis not present

## 2022-04-18 DIAGNOSIS — B351 Tinea unguium: Secondary | ICD-10-CM | POA: Diagnosis not present

## 2022-05-24 DIAGNOSIS — E875 Hyperkalemia: Secondary | ICD-10-CM | POA: Diagnosis not present

## 2022-05-24 DIAGNOSIS — N1832 Chronic kidney disease, stage 3b: Secondary | ICD-10-CM | POA: Diagnosis not present

## 2022-05-24 DIAGNOSIS — I10 Essential (primary) hypertension: Secondary | ICD-10-CM | POA: Diagnosis not present

## 2022-06-05 DIAGNOSIS — I10 Essential (primary) hypertension: Secondary | ICD-10-CM | POA: Diagnosis not present

## 2022-06-05 DIAGNOSIS — N1832 Chronic kidney disease, stage 3b: Secondary | ICD-10-CM | POA: Diagnosis not present

## 2022-06-05 DIAGNOSIS — E875 Hyperkalemia: Secondary | ICD-10-CM | POA: Diagnosis not present

## 2022-06-06 DIAGNOSIS — E119 Type 2 diabetes mellitus without complications: Secondary | ICD-10-CM | POA: Diagnosis not present

## 2022-06-14 ENCOUNTER — Other Ambulatory Visit: Payer: Self-pay | Admitting: Cardiology

## 2022-06-14 DIAGNOSIS — Z794 Long term (current) use of insulin: Secondary | ICD-10-CM | POA: Diagnosis not present

## 2022-06-14 DIAGNOSIS — E785 Hyperlipidemia, unspecified: Secondary | ICD-10-CM | POA: Diagnosis not present

## 2022-06-14 DIAGNOSIS — N183 Chronic kidney disease, stage 3 unspecified: Secondary | ICD-10-CM | POA: Diagnosis not present

## 2022-06-14 DIAGNOSIS — I129 Hypertensive chronic kidney disease with stage 1 through stage 4 chronic kidney disease, or unspecified chronic kidney disease: Secondary | ICD-10-CM | POA: Diagnosis not present

## 2022-06-14 DIAGNOSIS — E1122 Type 2 diabetes mellitus with diabetic chronic kidney disease: Secondary | ICD-10-CM | POA: Diagnosis not present

## 2022-06-14 DIAGNOSIS — E1165 Type 2 diabetes mellitus with hyperglycemia: Secondary | ICD-10-CM | POA: Diagnosis not present

## 2022-06-26 DIAGNOSIS — I422 Other hypertrophic cardiomyopathy: Secondary | ICD-10-CM | POA: Diagnosis not present

## 2022-06-26 DIAGNOSIS — I10 Essential (primary) hypertension: Secondary | ICD-10-CM | POA: Diagnosis not present

## 2022-07-27 DIAGNOSIS — Z794 Long term (current) use of insulin: Secondary | ICD-10-CM | POA: Diagnosis not present

## 2022-07-27 DIAGNOSIS — E1165 Type 2 diabetes mellitus with hyperglycemia: Secondary | ICD-10-CM | POA: Diagnosis not present

## 2022-08-14 DIAGNOSIS — R928 Other abnormal and inconclusive findings on diagnostic imaging of breast: Secondary | ICD-10-CM | POA: Diagnosis not present

## 2022-08-14 DIAGNOSIS — N6320 Unspecified lump in the left breast, unspecified quadrant: Secondary | ICD-10-CM | POA: Diagnosis not present

## 2022-08-14 DIAGNOSIS — R92323 Mammographic fibroglandular density, bilateral breasts: Secondary | ICD-10-CM | POA: Diagnosis not present

## 2022-08-27 DIAGNOSIS — E1122 Type 2 diabetes mellitus with diabetic chronic kidney disease: Secondary | ICD-10-CM | POA: Diagnosis not present

## 2022-08-27 DIAGNOSIS — J9611 Chronic respiratory failure with hypoxia: Secondary | ICD-10-CM | POA: Diagnosis not present

## 2022-08-27 DIAGNOSIS — I517 Cardiomegaly: Secondary | ICD-10-CM | POA: Diagnosis not present

## 2022-08-27 DIAGNOSIS — I5033 Acute on chronic diastolic (congestive) heart failure: Secondary | ICD-10-CM | POA: Diagnosis not present

## 2022-08-27 DIAGNOSIS — I110 Hypertensive heart disease with heart failure: Secondary | ICD-10-CM | POA: Diagnosis not present

## 2022-08-27 DIAGNOSIS — I052 Rheumatic mitral stenosis with insufficiency: Secondary | ICD-10-CM | POA: Diagnosis not present

## 2022-08-27 DIAGNOSIS — I130 Hypertensive heart and chronic kidney disease with heart failure and stage 1 through stage 4 chronic kidney disease, or unspecified chronic kidney disease: Secondary | ICD-10-CM | POA: Diagnosis not present

## 2022-08-27 DIAGNOSIS — R918 Other nonspecific abnormal finding of lung field: Secondary | ICD-10-CM | POA: Diagnosis not present

## 2022-08-27 DIAGNOSIS — I421 Obstructive hypertrophic cardiomyopathy: Secondary | ICD-10-CM | POA: Diagnosis not present

## 2022-08-27 DIAGNOSIS — Z6841 Body Mass Index (BMI) 40.0 and over, adult: Secondary | ICD-10-CM | POA: Diagnosis not present

## 2022-08-27 DIAGNOSIS — J449 Chronic obstructive pulmonary disease, unspecified: Secondary | ICD-10-CM | POA: Diagnosis not present

## 2022-08-27 DIAGNOSIS — I422 Other hypertrophic cardiomyopathy: Secondary | ICD-10-CM | POA: Diagnosis not present

## 2022-08-27 DIAGNOSIS — E1151 Type 2 diabetes mellitus with diabetic peripheral angiopathy without gangrene: Secondary | ICD-10-CM | POA: Diagnosis not present

## 2022-08-27 DIAGNOSIS — E1165 Type 2 diabetes mellitus with hyperglycemia: Secondary | ICD-10-CM | POA: Diagnosis not present

## 2022-08-27 DIAGNOSIS — I509 Heart failure, unspecified: Secondary | ICD-10-CM | POA: Diagnosis not present

## 2022-08-27 DIAGNOSIS — R0600 Dyspnea, unspecified: Secondary | ICD-10-CM | POA: Diagnosis not present

## 2022-08-27 DIAGNOSIS — N184 Chronic kidney disease, stage 4 (severe): Secondary | ICD-10-CM | POA: Diagnosis not present

## 2022-09-08 ENCOUNTER — Encounter: Payer: Self-pay | Admitting: Cardiology

## 2022-09-08 DIAGNOSIS — I2781 Cor pulmonale (chronic): Secondary | ICD-10-CM | POA: Diagnosis not present

## 2022-09-08 DIAGNOSIS — J45909 Unspecified asthma, uncomplicated: Secondary | ICD-10-CM | POA: Diagnosis not present

## 2022-09-08 DIAGNOSIS — G4730 Sleep apnea, unspecified: Secondary | ICD-10-CM | POA: Diagnosis not present

## 2022-09-14 ENCOUNTER — Telehealth: Payer: Self-pay | Admitting: Cardiology

## 2022-09-14 NOTE — Telephone Encounter (Signed)
Morgan Fischer is requesting a callback regarding needing a signature for home health order. She'd like to discuss further once called back. Please advise

## 2022-09-14 NOTE — Telephone Encounter (Signed)
The reason for the referral is for skilled nursing and PT to see the patient. The diagnosis is hypertensive heart and chronic kidney disease with heart failure.

## 2022-09-14 NOTE — Telephone Encounter (Signed)
Left message for Morgan Fischer to call back.

## 2022-09-14 NOTE — Telephone Encounter (Signed)
I called and spoke with Lanora Manis advised that we have not seen her since 02-10-21. Advised we could not sign the order. Lanora Manis verbalized understanding and had no additional quesitons.

## 2022-09-14 NOTE — Telephone Encounter (Signed)
Called Morgan Fischer from [facility] and she is asking if Dr. Tomie China would be willing to sign the home health order since the patient was in the hospital recently for heart failure exacerbation. I asked her if the patient's PCP would be able to sign the order and she stated that she tried and the patient's PCP doesn't sign home health orders for anybody. She stated that if they are unable to get a physician to sign the order they will have to discharge the patient.

## 2022-09-26 DIAGNOSIS — E119 Type 2 diabetes mellitus without complications: Secondary | ICD-10-CM | POA: Diagnosis not present

## 2022-09-28 ENCOUNTER — Ambulatory Visit: Payer: Medicare HMO | Attending: Cardiology | Admitting: Cardiology

## 2022-09-28 ENCOUNTER — Encounter: Payer: Self-pay | Admitting: Cardiology

## 2022-09-28 VITALS — BP 126/60 | HR 63 | Ht 63.6 in | Wt 293.0 lb

## 2022-09-28 DIAGNOSIS — E782 Mixed hyperlipidemia: Secondary | ICD-10-CM

## 2022-09-28 DIAGNOSIS — I421 Obstructive hypertrophic cardiomyopathy: Secondary | ICD-10-CM

## 2022-09-28 DIAGNOSIS — E7800 Pure hypercholesterolemia, unspecified: Secondary | ICD-10-CM

## 2022-09-28 DIAGNOSIS — I10 Essential (primary) hypertension: Secondary | ICD-10-CM

## 2022-09-28 DIAGNOSIS — E119 Type 2 diabetes mellitus without complications: Secondary | ICD-10-CM | POA: Diagnosis not present

## 2022-09-28 NOTE — Progress Notes (Signed)
Cardiology Office Note:    Date:  09/28/2022   ID:  Morgan Fischer, DOB Oct 04, 1950, MRN [id number]  PCP:  Jackie Plum, MD  Cardiologist:  Garwin Brothers, MD   Referring MD: Jackie Plum, MD    ASSESSMENT:    1. Pure hypercholesterolemia   2. Essential (primary) hypertension   3. HOCM (hypertrophic obstructive cardiomyopathy) (HCC)   4. Diabetes mellitus without complication (HCC)   5. Mixed hyperlipidemia   6. Morbid obesity (HCC)    PLAN:    In order of problems listed above:  Primary prevention stressed with the patient.  Importance of compliance with diet medication stressed and patient verbalized standing. Essential hypertension: Blood pressure stable and diet was emphasized.  Her recheck blood pressure was significantly better than when she came in.  It was normal. Hypertrophic cardiomyopathy: I reviewed specialist notes from [facility].  EKG was reviewed and discussed with her.  Will do a Chem-7 and magnesium level.  Will send copy of this report and EKG to her cardiologist at [facility]. Diabetes mellitus and morbid obesity: Discussed with the patient.  Lifestyle modification urged and she promises to do better. I reviewed her EKG and compared with previous EKG.  I will send a copy of this note and EKG to Dr. Regino Schultze a cardiologist at [facility] to advise Morgan Fischer about any intervention or changes in her medications  Patient will be seen in follow-up appointment in 6 months or earlier if the patient has any concerns.     Medication Adjustments/Labs and Tests Ordered: Current medicines are reviewed at length with the patient today.  Concerns regarding medicines are outlined above.  Orders Placed This Encounter  Procedures   EKG 12-Lead   No orders of the defined types were placed in this encounter.    No chief complaint on file.    History of Present Illness:    Morgan Fischer is a 72 y.o. female.  Patient has past medical history of hypertrophic cardiomyopathy, essential  hypertension, morbid obesity and diabetes mellitus and renal insufficiency.  She denies any problems at this time and takes care of activities of daily living.  She was admitted to the hospital with congestive heart failure treated and released.  She follows cardiologist at [facility] on a regular basis and had a recent follow-up.  I reviewed those reports's office notes and echocardiogram report.  At the time of my evaluation, the patient is alert awake oriented and in no distress.  Past Medical History:  Diagnosis Date   Acute diastolic heart failure (HCC) 07/17/2019   Acute hypercapnic respiratory failure (HCC) 07/21/2016   Acute on chronic congestive heart failure (HCC) 07/19/2016   Acute on chronic diastolic CHF (congestive heart failure) (HCC) 03/08/2020   Acute on chronic respiratory failure with hypoxia (HCC) 10/20/2018   Acute on chronic respiratory failure with hypoxia and hypercapnia (HCC) 03/08/2020   Acute respiratory acidosis (HCC) 07/19/2016   Acute respiratory distress 07/19/2016   Acute respiratory failure (HCC) 03/08/2020   Anxiety 03/15/2020   Aortic stenosis 11/01/2018   Arthritis    Asthma    Atherosclerosis of native coronary artery of native heart without angina pectoris 03/17/2016   Cardiomyopathy (HCC) 03/15/2020   Chest pain 07/14/2019   CHF (congestive heart failure) (HCC)    Chronic diastolic heart failure (HCC) 03/17/2016   Chronic hypoxemic respiratory failure (HCC)    Chronic kidney disease, stage 3b (HCC) 02/04/2021   CKD (chronic kidney disease)    COPD (chronic obstructive pulmonary disease) (HCC)  COPD with acute exacerbation (HCC) 03/08/2020   Diabetes mellitus without complication (HCC)    Drug allergy 07/21/2016   Essential (primary) hypertension 11/19/2016   Essential hypertension 11/19/2016   Gout 11/19/2016   Heart block AV complete (HCC)    High cholesterol    HOCM (hypertrophic obstructive cardiomyopathy) (HCC) 09/18/2017   Hyperlipidemia,  unspecified 03/15/2020   Hypertension    Hypertensive heart disease with heart failure (HCC) 03/17/2016   Hypertensive heart disease without congestive heart failure 03/15/2020   Hypertrophic cardiomyopathy (HCC) 04/12/2020   Hypokalemia 08/24/2015   Hypomagnesemia    Hypoxia 03/17/2016   Iron deficiency 03/15/2020   Leukocytosis 07/19/2016   Long term (current) use of insulin (HCC) 02/04/2021   Mild intermittent asthma 02/04/2021   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   Mixed hyperlipidemia 03/15/2020   Morbid obesity (HCC)    Morbid obesity with BMI of 60.0-69.9, adult (HCC) 07/21/2016   Nonrheumatic aortic valve stenosis 03/17/2016   Obesity hypoventilation syndrome (HCC) 03/08/2020   Obstructive sleep apnea 11/19/2016   Osteoarthritis of knee 03/15/2020   Panniculitis 08/23/2015   Peripheral venous insufficiency 03/15/2020   Pure hypercholesterolemia 02/13/2017   SOB (shortness of breath) 08/24/2015   Troponin level elevated 08/24/2015   Type 2 diabetes mellitus with hyperglycemia (HCC) 07/19/2016   Type 2 diabetes mellitus without complication, without long-term current use of insulin (HCC) 04/12/2020   Type 2 diabetes mellitus without complications (HCC) 03/15/2020   Type 2 diabetes mellitus, without long-term current use of insulin (HCC) 11/19/2016    Past Surgical History:  Procedure Laterality Date   ABDOMINAL HYSTERECTOMY     CESAREAN SECTION      Current Medications: Current Meds  Medication Sig   allopurinol (ZYLOPRIM) 300 MG tablet Take 300 mg by mouth daily.   aspirin EC 81 MG EC tablet Take 1 tablet (81 mg total) by mouth daily.   Cholecalciferol 50 MCG (2000 UT) CAPS Take 2,000 Units by mouth daily.   Cyanocobalamin (VITAMIN B-12 PO) Take 1 tablet by mouth daily.   dapagliflozin propanediol (FARXIGA) 10 MG TABS tablet Take 10 mg by mouth daily.   diclofenac Sodium (VOLTAREN) 1 % GEL Apply 1 application topically as needed for pain (pain).   ferrous sulfate  325 (65 FE) MG EC tablet Take 1 tablet by mouth daily.   fluticasone furoate-vilanterol (BREO ELLIPTA) 100-25 MCG/ACT AEPB Inhale 1 puff into the lungs daily.   furosemide (LASIX) 20 MG tablet Take 20 mg by mouth daily.   Garlic 1000 MG CAPS Take 1,000 mg by mouth daily.   hydrochlorothiazide (MICROZIDE) 12.5 MG capsule Take 12.5 mg by mouth daily.   ipratropium-albuterol (DUONEB) 0.5-2.5 (3) MG/3ML SOLN Inhale 3 mLs into the lungs every 4 (four) hours as needed for wheezing (wheezing).   LANTUS SOLOSTAR 100 UNIT/ML Solostar Pen Inject 15 Units into the skin at bedtime.   losartan (COZAAR) 100 MG tablet Take 1 tablet by mouth daily.   metoprolol succinate (TOPROL-XL) 50 MG 24 hr tablet TAKE 1 TABLET(50 MG) BY MOUTH DAILY   montelukast (SINGULAIR) 10 MG tablet Take 10 mg by mouth at bedtime.   Multiple Vitamin (MULTIVITAMIN WITH MINERALS) TABS tablet Take 1 tablet by mouth daily.   nitroGLYCERIN (NITROSTAT) 0.4 MG SL tablet Place 1 tablet (0.4 mg total) under the tongue every 5 (five) minutes as needed for chest pain.   Omega-3 Fatty Acids (FISH OIL) 1000 MG CAPS Take 1,000 mg by mouth 2 (two) times daily.    OXYGEN Inhale 2  L into the lungs continuous.   polyethylene glycol (MIRALAX / GLYCOLAX) 17 g packet Take 17 g by mouth as needed for mild constipation.   Probiotic Product (PROBIOTIC PO) Take 1 tablet by mouth daily in the afternoon.   rosuvastatin (CRESTOR) 40 MG tablet Take 40 mg by mouth at bedtime.   spironolactone (ALDACTONE) 25 MG tablet Take 25 mg by mouth every Monday, Wednesday, and Friday.   Turmeric 500 MG CAPS Take 1 capsule by mouth daily.   VENTOLIN HFA 108 (90 Base) MCG/ACT inhaler Inhale 2 puffs into the lungs every 6 (six) hours as needed for wheezing or shortness of breath.      Allergies:   Atorvastatin, Ciprofloxacin, Ciprocinonide [fluocinolone], Catapres [clonidine hcl], Clonidine, and Demadex [torsemide]   Social History   Socioeconomic History   Marital status:  Widowed    Spouse name: Not on file   Number of children: Not on file   Years of education: Not on file   Highest education level: Not on file  Occupational History   Not on file  Tobacco Use   Smoking status: Never   Smokeless tobacco: Never  Vaping Use   Vaping status: Never Used  Substance and Sexual Activity   Alcohol use: No   Drug use: No   Sexual activity: Never  Other Topics Concern   Not on file  Social History Narrative   Not on file   Social Determinants of Health   Financial Resource Strain: Not on file  Food Insecurity: Low Risk  (08/27/2022)   Received from [facility]   Hunger Vital Sign    Worried About Running Out of Food in the Last Year: Never true    Ran Out of Food in the Last Year: Never true  Transportation Needs: No Transportation Needs (08/27/2022)   Received from [facility]    In the past 12 months, has lack of reliable transportation kept you from medical appointments, meetings, work or from getting things needed for daily living? : No  Physical Activity: Not on file  Stress: Not on file  Social Connections: Not on file     Family History: The patient's family history includes Breast cancer in her sister; CAD in her mother; Cancer in her father and sister; Diabetes in her mother; Diabetes Mellitus I in her mother; Heart disease in her father.  ROS:   Please see the history of present illness.    All other systems reviewed and are negative.  EKGs/Labs/Other Studies Reviewed:    The following studies were reviewed today: .Marland KitchenEKG Interpretation Date/Time:  Thursday September 28 2022 10:20:42 EDT Ventricular Rate:  63 PR Interval:  160 QRS Duration:  116 QT Interval:  490 QTC Calculation: 501 R Axis:   49  Text Interpretation: Normal sinus rhythm Incomplete left bundle branch block ST & T wave abnormality, consider inferolateral ischemia Prolonged QT When compared with ECG of 08-Mar-2020 10:47, PREVIOUS ECG IS PRESENT  Confirmed by Belva Crome [phone]) on 09/28/2022 10:30:16 AM     Recent Labs: No results found for requested labs within last 365 days.  Recent Lipid Panel    Component Value Date/Time   CHOL 181 08/24/2015 0422   TRIG 100 08/24/2015 0422   HDL 43 08/24/2015 0422   CHOLHDL 4.2 08/24/2015 0422   VLDL 20 08/24/2015 0422   LDLCALC 118 (H) 08/24/2015 0422    Physical Exam:    VS:  BP (!) 158/70   Pulse 63   Ht  5' 3.6" (1.615 m)   Wt 293 lb (132.9 kg)   LMP  (LMP Unknown)   SpO2 95%   BMI 50.93 kg/m     Wt Readings from Last 3 Encounters:  09/28/22 293 lb (132.9 kg)  02/10/21 (!) 308 lb 0.6 oz (139.7 kg)  06/23/20 293 lb (132.9 kg)     GEN: Patient is in no acute distress HEENT: Normal NECK: No JVD; No carotid bruits LYMPHATICS: No lymphadenopathy CARDIAC: Hear sounds regular, 2/6 systolic murmur at the apex. RESPIRATORY:  Clear to auscultation without rales, wheezing or rhonchi  ABDOMEN: Soft, non-tender, non-distended MUSCULOSKELETAL:  No edema; No deformity  SKIN: Warm and dry NEUROLOGIC:  Alert and oriented x 3 PSYCHIATRIC:  Normal affect   Signed, Garwin Brothers, MD  09/28/2022 10:32 AM     [facility]

## 2022-09-28 NOTE — Patient Instructions (Signed)
Medication Instructions:  Your physician recommends that you continue on your current medications as directed. Please refer to the Current Medication list given to you today.  *If you need a refill on your cardiac medications before your next appointment, please call your pharmacy*   Lab Work: None ordered If you have labs (blood work) drawn today and your tests are completely normal, you will receive your results only by: MyChart Message (if you have MyChart) OR A paper copy in the mail If you have any lab test that is abnormal or we need to change your treatment, we will call you to review the results.   Testing/Procedures: None ordered   Follow-Up: At [facility], you and your health needs are our priority.  As part of our continuing mission to provide you with exceptional heart care, we have created designated Provider Care Teams.  These Care Teams include your primary Cardiologist (physician) and Advanced Practice Providers (APPs -  Physician Assistants and Nurse Practitioners) who all work together to provide you with the care you need, when you need it.  We recommend signing up for the patient portal called "MyChart".  Sign up information is provided on this After Visit Summary.  MyChart is used to connect with patients for Virtual Visits (Telemedicine).  Patients are able to view lab/test results, encounter notes, upcoming appointments, etc.  Non-urgent messages can be sent to your provider as well.   To learn more about what you can do with MyChart, go to [email or web address].    Your next appointment:   9 month(s)  The format for your next appointment:   In Person  Provider:   Belva Crome, MD    Other Instructions none  Important Information About Sugar

## 2022-09-29 ENCOUNTER — Telehealth: Payer: Self-pay

## 2022-09-29 ENCOUNTER — Other Ambulatory Visit: Payer: Self-pay | Admitting: Cardiology

## 2022-09-29 ENCOUNTER — Telehealth: Payer: Self-pay | Admitting: Cardiology

## 2022-09-29 DIAGNOSIS — E875 Hyperkalemia: Secondary | ICD-10-CM

## 2022-09-29 LAB — COMPREHENSIVE METABOLIC PANEL
ALT: 12 [IU]/L (ref 0–32)
AST: 16 [IU]/L (ref 0–40)
Albumin: 4.1 g/dL (ref 3.8–4.8)
Alkaline Phosphatase: 67 [IU]/L (ref 44–121)
BUN/Creatinine Ratio: 35 — ABNORMAL HIGH (ref 12–28)
BUN: 63 mg/dL — ABNORMAL HIGH (ref 8–27)
Bilirubin Total: <0.2 mg/dL (ref 0.0–1.2)
CO2: 27 mmol/L (ref 20–29)
Calcium: 9.1 mg/dL (ref 8.7–10.3)
Chloride: 103 mmol/L (ref 96–106)
Creatinine, Ser: 1.8 mg/dL — ABNORMAL HIGH (ref 0.57–1.00)
Globulin, Total: 2.4 g/dL (ref 1.5–4.5)
Glucose: 107 mg/dL — ABNORMAL HIGH (ref 70–99)
Potassium: 5.4 mmol/L — ABNORMAL HIGH (ref 3.5–5.2)
Sodium: 145 mmol/L — ABNORMAL HIGH (ref 134–144)
Total Protein: 6.5 g/dL (ref 6.0–8.5)
eGFR: 29 mL/min/{1.73_m2} — ABNORMAL LOW (ref >59)

## 2022-09-29 LAB — MAGNESIUM: Magnesium: 2.8 mg/dL — ABNORMAL HIGH (ref 1.6–2.3)

## 2022-09-29 MED ORDER — SODIUM POLYSTYRENE SULFONATE PO POWD
Freq: Once | ORAL | 0 refills | Status: AC
Start: 2022-09-29 — End: 2022-09-29

## 2022-09-29 NOTE — Telephone Encounter (Signed)
-----   Message from Garwin Brothers sent at 09/29/2022  9:54 AM EDT ----- Kidney function is worse.  She needs to talk to her primary care about this.  Also best to get appointment with nephrology since she has complex medical issues.  He is she taking both diuretics hydrochlorothiazide and Lasix.  Kayexalate 30 g 1 dose only and Chem-7 repeated in 1 week.  Copy primary care Garwin Brothers, MD 09/29/2022 9:54 AM

## 2022-09-29 NOTE — Telephone Encounter (Signed)
Results reviewed with pt as per Dr. Kem Parkinson note.  Pt verbalized understanding and had no additional questions. Routed to PCP.  Labs sent to DR. Darliss Cheney with nephrology.

## 2022-09-29 NOTE — Telephone Encounter (Signed)
Caller Lanora Manis) wants to know if Dr. Tomie China would reconsider signing patient's home health orders since patient had in-office visit yesterday.  Caller noted patient has appointment with new PCP on 11/21.

## 2022-10-02 NOTE — Telephone Encounter (Signed)
Left vm for Lanora Manis to return call.

## 2022-10-06 DIAGNOSIS — E119 Type 2 diabetes mellitus without complications: Secondary | ICD-10-CM | POA: Diagnosis not present

## 2022-10-11 DIAGNOSIS — E875 Hyperkalemia: Secondary | ICD-10-CM | POA: Diagnosis not present

## 2022-10-12 LAB — BASIC METABOLIC PANEL
BUN/Creatinine Ratio: 26 (ref 12–28)
BUN: 50 mg/dL — ABNORMAL HIGH (ref 8–27)
CO2: 27 mmol/L (ref 20–29)
Calcium: 9.1 mg/dL (ref 8.7–10.3)
Chloride: 100 mmol/L (ref 96–106)
Creatinine, Ser: 1.9 mg/dL — ABNORMAL HIGH (ref 0.57–1.00)
Glucose: 121 mg/dL — ABNORMAL HIGH (ref 70–99)
Potassium: 5.2 mmol/L (ref 3.5–5.2)
Sodium: 143 mmol/L (ref 134–144)
eGFR: 28 mL/min/{1.73_m2} — ABNORMAL LOW (ref >59)

## 2022-10-18 DIAGNOSIS — E119 Type 2 diabetes mellitus without complications: Secondary | ICD-10-CM | POA: Diagnosis not present

## 2022-11-02 DIAGNOSIS — Z794 Long term (current) use of insulin: Secondary | ICD-10-CM | POA: Diagnosis not present

## 2022-11-02 DIAGNOSIS — Z978 Presence of other specified devices: Secondary | ICD-10-CM | POA: Diagnosis not present

## 2022-11-02 DIAGNOSIS — E1165 Type 2 diabetes mellitus with hyperglycemia: Secondary | ICD-10-CM | POA: Diagnosis not present

## 2022-11-02 DIAGNOSIS — E1122 Type 2 diabetes mellitus with diabetic chronic kidney disease: Secondary | ICD-10-CM | POA: Diagnosis not present

## 2022-11-02 DIAGNOSIS — N183 Chronic kidney disease, stage 3 unspecified: Secondary | ICD-10-CM | POA: Diagnosis not present

## 2022-11-02 DIAGNOSIS — Z7984 Long term (current) use of oral hypoglycemic drugs: Secondary | ICD-10-CM | POA: Diagnosis not present

## 2022-11-06 DIAGNOSIS — M25561 Pain in right knee: Secondary | ICD-10-CM | POA: Diagnosis not present

## 2022-11-06 DIAGNOSIS — M1711 Unilateral primary osteoarthritis, right knee: Secondary | ICD-10-CM | POA: Diagnosis not present

## 2022-11-10 DIAGNOSIS — E119 Type 2 diabetes mellitus without complications: Secondary | ICD-10-CM | POA: Diagnosis not present

## 2022-11-20 DIAGNOSIS — N1832 Chronic kidney disease, stage 3b: Secondary | ICD-10-CM | POA: Diagnosis not present

## 2022-11-20 DIAGNOSIS — R809 Proteinuria, unspecified: Secondary | ICD-10-CM | POA: Diagnosis not present

## 2022-11-20 DIAGNOSIS — M1711 Unilateral primary osteoarthritis, right knee: Secondary | ICD-10-CM | POA: Diagnosis not present

## 2022-11-22 DIAGNOSIS — N184 Chronic kidney disease, stage 4 (severe): Secondary | ICD-10-CM | POA: Diagnosis not present

## 2022-11-22 DIAGNOSIS — I2510 Atherosclerotic heart disease of native coronary artery without angina pectoris: Secondary | ICD-10-CM | POA: Diagnosis not present

## 2022-11-22 DIAGNOSIS — E875 Hyperkalemia: Secondary | ICD-10-CM | POA: Diagnosis not present

## 2022-11-22 DIAGNOSIS — G4733 Obstructive sleep apnea (adult) (pediatric): Secondary | ICD-10-CM | POA: Diagnosis not present

## 2022-11-22 DIAGNOSIS — I10 Essential (primary) hypertension: Secondary | ICD-10-CM | POA: Diagnosis not present

## 2022-11-22 DIAGNOSIS — J449 Chronic obstructive pulmonary disease, unspecified: Secondary | ICD-10-CM | POA: Diagnosis not present

## 2022-11-22 DIAGNOSIS — E790 Hyperuricemia without signs of inflammatory arthritis and tophaceous disease: Secondary | ICD-10-CM | POA: Diagnosis not present

## 2022-11-23 ENCOUNTER — Ambulatory Visit: Payer: Medicare HMO | Admitting: Family Medicine

## 2022-11-23 ENCOUNTER — Encounter: Payer: Self-pay | Admitting: Family Medicine

## 2022-11-23 VITALS — BP 150/80 | HR 58 | Temp 98.0°F | Resp 18 | Ht 63.6 in | Wt 283.4 lb

## 2022-11-23 DIAGNOSIS — Z23 Encounter for immunization: Secondary | ICD-10-CM | POA: Insufficient documentation

## 2022-11-23 DIAGNOSIS — E785 Hyperlipidemia, unspecified: Secondary | ICD-10-CM

## 2022-11-23 DIAGNOSIS — J9611 Chronic respiratory failure with hypoxia: Secondary | ICD-10-CM | POA: Diagnosis not present

## 2022-11-23 DIAGNOSIS — E1122 Type 2 diabetes mellitus with diabetic chronic kidney disease: Secondary | ICD-10-CM | POA: Diagnosis not present

## 2022-11-23 DIAGNOSIS — Z794 Long term (current) use of insulin: Secondary | ICD-10-CM

## 2022-11-23 DIAGNOSIS — I10 Essential (primary) hypertension: Secondary | ICD-10-CM

## 2022-11-23 DIAGNOSIS — N1832 Chronic kidney disease, stage 3b: Secondary | ICD-10-CM

## 2022-11-23 DIAGNOSIS — I2510 Atherosclerotic heart disease of native coronary artery without angina pectoris: Secondary | ICD-10-CM | POA: Diagnosis not present

## 2022-11-23 DIAGNOSIS — N183 Chronic kidney disease, stage 3 unspecified: Secondary | ICD-10-CM

## 2022-11-23 DIAGNOSIS — G4700 Insomnia, unspecified: Secondary | ICD-10-CM

## 2022-11-23 DIAGNOSIS — I5032 Chronic diastolic (congestive) heart failure: Secondary | ICD-10-CM | POA: Diagnosis not present

## 2022-11-23 DIAGNOSIS — E1169 Type 2 diabetes mellitus with other specified complication: Secondary | ICD-10-CM | POA: Diagnosis not present

## 2022-11-23 DIAGNOSIS — I421 Obstructive hypertrophic cardiomyopathy: Secondary | ICD-10-CM

## 2022-11-23 HISTORY — DX: Insomnia, unspecified: G47.00

## 2022-11-23 HISTORY — DX: Type 2 diabetes mellitus with other specified complication: E11.69

## 2022-11-23 HISTORY — DX: Encounter for immunization: Z23

## 2022-11-23 LAB — COMPREHENSIVE METABOLIC PANEL
ALT: 14 U/L (ref 0–35)
AST: 13 U/L (ref 0–37)
Albumin: 4.5 g/dL (ref 3.5–5.2)
Alkaline Phosphatase: 53 U/L (ref 39–117)
BUN: 56 mg/dL — ABNORMAL HIGH (ref 6–23)
CO2: 33 meq/L — ABNORMAL HIGH (ref 19–32)
Calcium: 9 mg/dL (ref 8.4–10.5)
Chloride: 101 meq/L (ref 96–112)
Creatinine, Ser: 1.44 mg/dL — ABNORMAL HIGH (ref 0.40–1.20)
GFR: 35.95 mL/min — ABNORMAL LOW (ref >60.00)
Glucose, Bld: 99 mg/dL (ref 70–99)
Potassium: 4.7 meq/L (ref 3.5–5.1)
Sodium: 141 meq/L (ref 135–145)
Total Bilirubin: 0.5 mg/dL (ref 0.2–1.2)
Total Protein: 6.6 g/dL (ref 6.0–8.3)

## 2022-11-23 LAB — CBC WITH DIFFERENTIAL/PLATELET
Basophils Absolute: 0 10*3/uL (ref 0.0–0.1)
Basophils Relative: 0.4 % (ref 0.0–3.0)
Eosinophils Absolute: 0 10*3/uL (ref 0.0–0.7)
Eosinophils Relative: 0 % (ref 0.0–5.0)
HCT: 36.4 % (ref 36.0–46.0)
Hemoglobin: 11.3 g/dL — ABNORMAL LOW (ref 12.0–15.0)
Lymphocytes Relative: 23.9 % (ref 12.0–46.0)
Lymphs Abs: 1.8 10*3/uL (ref 0.7–4.0)
MCHC: 31 g/dL (ref 30.0–36.0)
MCV: 89.9 fL (ref 78.0–100.0)
Monocytes Absolute: 0.6 10*3/uL (ref 0.1–1.0)
Monocytes Relative: 7.9 % (ref 3.0–12.0)
Neutro Abs: 5.2 10*3/uL (ref 1.4–7.7)
Neutrophils Relative %: 67.8 % (ref 43.0–77.0)
Platelets: 148 10*3/uL — ABNORMAL LOW (ref 150.0–400.0)
RBC: 4.05 Mil/uL (ref 3.87–5.11)
RDW: 17.4 % — ABNORMAL HIGH (ref 11.5–15.5)
WBC: 7.7 10*3/uL (ref 4.0–10.5)

## 2022-11-23 LAB — HEMOGLOBIN A1C: Hgb A1c MFr Bld: 8.1 % — ABNORMAL HIGH (ref 4.6–6.5)

## 2022-11-23 LAB — LIPID PANEL
Cholesterol: 148 mg/dL (ref 0–200)
HDL: 33 mg/dL — ABNORMAL LOW (ref >39.00)
LDL Cholesterol: 89 mg/dL (ref 0–99)
NonHDL: 115.11
Total CHOL/HDL Ratio: 4
Triglycerides: 133 mg/dL (ref 0.0–149.0)
VLDL: 26.6 mg/dL (ref 0.0–40.0)

## 2022-11-23 LAB — MICROALBUMIN / CREATININE URINE RATIO
Creatinine,U: 51.4 mg/dL
Microalb Creat Ratio: 20.9 mg/g (ref 0.0–30.0)
Microalb, Ur: 10.7 mg/dL — ABNORMAL HIGH (ref 0.0–1.9)

## 2022-11-23 MED ORDER — TRAZODONE HCL 50 MG PO TABS
25.0000 mg | ORAL_TABLET | Freq: Every evening | ORAL | 3 refills | Status: DC | PRN
Start: 2022-11-23 — End: 2022-12-21

## 2022-11-23 NOTE — Progress Notes (Signed)
New Patient Office Visit  Subjective    Patient ID: Morgan Fischer, female    DOB: Jun 18, 1950  Age: 72 y.o. MRN: [id number]  CC:  Chief Complaint  Patient presents with   New Patient (Initial Visit)    Pt states wanting to establish care, pt states right knee pain. Pt states having a diabetes and ortho provider. Pt states having trouble sleeping.     HPI Indica Sokolski presents to establish care Discussed the use of AI scribe software for clinical note transcription with the patient, who gave verbal consent to proceed.  History of Present Illness   The patient, Morgan Fischer, presents with a complex medical history, including diabetes, severe arthritis in the right knee, hypertrophic cardiomyopathy, and chronic obstructive pulmonary disease (COPD). The patient's primary concern is poorly controlled diabetes, with blood glucose levels consistently in the 200-300 range despite adherence to a low-fat diet and regular meals. The patient is currently on Lantus, Januvia, and [place] for diabetes management. Despite these interventions, the patient reports that blood glucose levels remain elevated, particularly after breakfast, which typically consists of bacon, eggs, grits or oatmeal, toast, and fruit.  The patient also reports severe arthritis in the right knee, which is bone-on-bone. An orthopedic specialist, Dr. Case, recently administered a steroid injection to manage the pain, which temporarily exacerbated the patient's blood glucose levels.  The patient also reports right shoulder pain following a recent fall.  The patient has a history of sleep disturbances, reporting difficulty falling asleep and staying asleep. The patient is currently on gabapentin to aid sleep, but reports that it only seems to be effective in the early morning hours.  The patient has a history of cellulitis of the abdomen in 2000, which required surgical intervention and prolonged wound care. The patient also reports a history  of high carbon dioxide levels, which necessitates the use of BiPAP.  The patient is under the care of multiple specialists, including an endocrinologist, orthopedic specialist, cardiologist, nephrologist, podiatrist, and pulmonologist. The patient is also on a water restriction due to kidney issues, which complicates dietary management of diabetes.  The patient has never smoked and reports a sedentary lifestyle due to mobility issues. The patient uses a rollator for short distances and a wheelchair for longer distances or when leaving the home. The patient is also on a low-sodium diet and reports being mindful of potassium intake due to kidney issues.  The patient is the oldest of ten siblings, two of whom have passed away. One sister died of an unspecified heart condition in her forties, and one brother died of prostate cancer in his late sixties. The patient is a widow, having lost her husband in 2018.       Outpatient Encounter Medications as of 11/23/2022  Medication Sig   allopurinol (ZYLOPRIM) 300 MG tablet Take 300 mg by mouth daily.   aspirin EC 81 MG EC tablet Take 1 tablet (81 mg total) by mouth daily.   Cholecalciferol 50 MCG (2000 UT) CAPS Take 2,000 Units by mouth daily.   Cyanocobalamin (VITAMIN B-12 PO) Take 1 tablet by mouth daily.   dapagliflozin propanediol (FARXIGA) 10 MG TABS tablet Take 10 mg by mouth daily.   diclofenac Sodium (VOLTAREN) 1 % GEL Apply 1 application topically as needed for pain (pain).   disopyramide (NORPACE CR) 100 MG 12 hr capsule Take 200 mg by mouth every 12 (twelve) hours.   ferrous sulfate 325 (65 FE) MG EC tablet Take 1 tablet by mouth daily.  fluticasone furoate-vilanterol (BREO ELLIPTA) 100-25 MCG/ACT AEPB Inhale 1 puff into the lungs daily.   gabapentin (NEURONTIN) 100 MG capsule Take 200 mg by mouth at bedtime.   Garlic 1000 MG CAPS Take 1,000 mg by mouth daily.   hydrochlorothiazide (MICROZIDE) 12.5 MG capsule Take 12.5 mg by mouth daily.    ipratropium-albuterol (DUONEB) 0.5-2.5 (3) MG/3ML SOLN Inhale 3 mLs into the lungs every 4 (four) hours as needed for wheezing (wheezing).   LANTUS SOLOSTAR 100 UNIT/ML Solostar Pen Inject 15 Units into the skin at bedtime.   losartan (COZAAR) 100 MG tablet Take 1 tablet by mouth daily.   metoprolol succinate (TOPROL-XL) 50 MG 24 hr tablet Take 1 tablet (50 mg total) by mouth daily.   montelukast (SINGULAIR) 10 MG tablet Take 10 mg by mouth at bedtime.   Multiple Vitamin (MULTIVITAMIN WITH MINERALS) TABS tablet Take 1 tablet by mouth daily.   Omega-3 Fatty Acids (FISH OIL) 1000 MG CAPS Take 1,000 mg by mouth 2 (two) times daily.    OXYGEN Inhale 2 L into the lungs continuous.   polyethylene glycol (MIRALAX / GLYCOLAX) 17 g packet Take 17 g by mouth as needed for mild constipation.   Probiotic Product (PROBIOTIC PO) Take 1 tablet by mouth daily in the afternoon.   rosuvastatin (CRESTOR) 40 MG tablet Take 40 mg by mouth at bedtime.   traZODone (DESYREL) 50 MG tablet Take 0.5-1 tablets (25-50 mg total) by mouth at bedtime as needed for sleep.   Turmeric 500 MG CAPS Take 1 capsule by mouth daily.   VENTOLIN HFA 108 (90 Base) MCG/ACT inhaler Inhale 2 puffs into the lungs every 6 (six) hours as needed for wheezing or shortness of breath.    [DISCONTINUED] furosemide (LASIX) 20 MG tablet Take 20 mg by mouth daily.   [DISCONTINUED] nitroGLYCERIN (NITROSTAT) 0.4 MG SL tablet Place 1 tablet (0.4 mg total) under the tongue every 5 (five) minutes as needed for chest pain.   [DISCONTINUED] spironolactone (ALDACTONE) 25 MG tablet Take 25 mg by mouth every Monday, Wednesday, and Friday.   No facility-administered encounter medications on file as of 11/23/2022.    Past Medical History:  Diagnosis Date   Acute diastolic heart failure (HCC) 07/17/2019   Acute hypercapnic respiratory failure (HCC) 07/21/2016   Acute on chronic congestive heart failure (HCC) 07/19/2016   Acute on chronic diastolic CHF  (congestive heart failure) (HCC) 03/08/2020   Acute on chronic respiratory failure with hypoxia (HCC) 10/20/2018   Acute on chronic respiratory failure with hypoxia and hypercapnia (HCC) 03/08/2020   Acute respiratory acidosis (HCC) 07/19/2016   Acute respiratory distress 07/19/2016   Acute respiratory failure (HCC) 03/08/2020   Anxiety 03/15/2020   Aortic stenosis 11/01/2018   Arthritis    Asthma    Atherosclerosis of native coronary artery of native heart without angina pectoris 03/17/2016   Cardiomyopathy (HCC) 03/15/2020   Chest pain 07/14/2019   CHF (congestive heart failure) (HCC)    Chronic diastolic heart failure (HCC) 03/17/2016   Chronic hypoxemic respiratory failure (HCC)    Chronic kidney disease, stage 3b (HCC) 02/04/2021   CKD (chronic kidney disease)    COPD (chronic obstructive pulmonary disease) (HCC)    COPD with acute exacerbation (HCC) 03/08/2020   Diabetes mellitus without complication (HCC)    Drug allergy 07/21/2016   Essential (primary) hypertension 11/19/2016   Essential hypertension 11/19/2016   Gout 11/19/2016   Heart block AV complete (HCC)    High cholesterol    HOCM (hypertrophic obstructive  cardiomyopathy) (HCC) 09/18/2017   Hyperlipidemia, unspecified 03/15/2020   Hypertension    Hypertensive heart disease with heart failure (HCC) 03/17/2016   Hypertensive heart disease without congestive heart failure 03/15/2020   Hypertrophic cardiomyopathy (HCC) 04/12/2020   Hypokalemia 08/24/2015   Hypomagnesemia    Hypoxia 03/17/2016   Iron deficiency 03/15/2020   Leukocytosis 07/19/2016   Long term (current) use of insulin (HCC) 02/04/2021   Mild intermittent asthma 02/04/2021   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   Mixed hyperlipidemia 03/15/2020   Morbid obesity (HCC)    Morbid obesity with BMI of 60.0-69.9, adult (HCC) 07/21/2016   Nonrheumatic aortic valve stenosis 03/17/2016   Obesity hypoventilation syndrome (HCC) 03/08/2020   Obstructive sleep  apnea 11/19/2016   Osteoarthritis of knee 03/15/2020   Panniculitis 08/23/2015   Peripheral venous insufficiency 03/15/2020   Pure hypercholesterolemia 02/13/2017   SOB (shortness of breath) 08/24/2015   Troponin level elevated 08/24/2015   Type 2 diabetes mellitus with hyperglycemia (HCC) 07/19/2016   Type 2 diabetes mellitus without complication, without long-term current use of insulin (HCC) 04/12/2020   Type 2 diabetes mellitus without complications (HCC) 03/15/2020   Type 2 diabetes mellitus, without long-term current use of insulin (HCC) 11/19/2016    Past Surgical History:  Procedure Laterality Date   ABDOMINAL HYSTERECTOMY     cellulitis debridement  2000   abdomen   CESAREAN SECTION      Family History  Problem Relation Age of Onset   Diabetes Mellitus I Mother    CAD Mother    Diabetes Mother    Heart disease Father    Cancer Father    Breast cancer Sister    Cancer Sister    Heart disease Sister    Prostate cancer Brother     Social History   Socioeconomic History   Marital status: Widowed    Spouse name: Not on file   Number of children: Not on file   Years of education: Not on file   Highest education level: Not on file  Occupational History   Occupation: retired    Comment: Copywriter, 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  Tobacco Use   Smoking status: Never   Smokeless tobacco: Never  Vaping Use   Vaping status: Never Used  Substance and Sexual Activity   Alcohol use: No   Drug use: No   Sexual activity: Never  Other Topics Concern   Not on file  Social History Narrative   Not on file   Social Determinants of Health   Financial Resource Strain: Not on file  Food Insecurity: Low Risk  (08/27/2022)   Received from [facility]   Hunger Vital Sign    Worried About Running Out of Food in the Last Year: Never true    Ran Out of Food in the Last Year: Never true  Transportation Needs: No Transportation Needs (08/27/2022)   Received from  [facility]    In the past 12 months, has lack of reliable transportation kept you from medical appointments, meetings, work or from getting things needed for daily living? : No  Physical Activity: Not on file  Stress: Not on file  Social Connections: Not on file  Intimate Partner Violence: Not on file    Review of Systems  Constitutional:  Negative for chills, fever and malaise/fatigue.  HENT:  Negative for congestion and hearing loss.   Eyes:  Negative for blurred vision and discharge.  Respiratory:  Positive for shortness of breath. Negative for  cough and sputum production.   Cardiovascular:  Negative for chest pain, palpitations and leg swelling.  Gastrointestinal:  Negative for abdominal pain, blood in stool, constipation, diarrhea, heartburn, nausea and vomiting.  Genitourinary:  Negative for dysuria, frequency, hematuria and urgency.  Musculoskeletal:  Positive for joint pain. Negative for back pain, falls and myalgias.  Skin:  Negative for rash.  Neurological:  Negative for dizziness, sensory change, loss of consciousness, weakness and headaches.  Endo/Heme/Allergies:  Negative for environmental allergies. Does not bruise/bleed easily.  Psychiatric/Behavioral:  Negative for depression and suicidal ideas. The patient is not nervous/anxious and does not have insomnia.         Objective    BP (!) 150/80 (BP Location: Left Arm, Patient Position: Sitting, Cuff Size: Large)   Pulse (!) 58   Temp 98 F (36.7 C) (Oral)   Resp 18   Ht 5' 3.6" (1.615 m)   Wt 283 lb 6.4 oz (128.5 kg) Comment: per patient  LMP  (LMP Unknown)   SpO2 98%   BMI 49.26 kg/m   Physical Exam Vitals and nursing note reviewed.  Constitutional:      General: She is not in acute distress.    Appearance: Normal appearance. She is well-developed.  HENT:     Head: Normocephalic and atraumatic.     Right Ear: Tympanic membrane, ear canal and external ear normal. There is no impacted  cerumen.     Left Ear: Tympanic membrane, ear canal and external ear normal. There is no impacted cerumen.     Nose: Nose normal.     Mouth/Throat:     Mouth: Mucous membranes are moist.     Pharynx: Oropharynx is clear. No oropharyngeal exudate or posterior oropharyngeal erythema.  Eyes:     General: No scleral icterus.       Right eye: No discharge.        Left eye: No discharge.     Conjunctiva/sclera: Conjunctivae normal.     Pupils: Pupils are equal, round, and reactive to light.  Neck:     Thyroid: No thyromegaly or thyroid tenderness.     Vascular: No JVD.  Cardiovascular:     Rate and Rhythm: Normal rate and regular rhythm.     Heart sounds: Normal heart sounds. No murmur heard. Pulmonary:     Effort: Pulmonary effort is normal. No respiratory distress.     Breath sounds: Normal breath sounds.  Abdominal:     General: Bowel sounds are normal. There is no distension.     Palpations: Abdomen is soft. There is no mass.     Tenderness: There is no abdominal tenderness. There is no guarding or rebound.  Genitourinary:    Vagina: Normal.  Musculoskeletal:        General: Normal range of motion.     Cervical back: Normal range of motion and neck supple.     Right lower leg: No edema.     Left lower leg: No edema.  Lymphadenopathy:     Cervical: No cervical adenopathy.  Skin:    General: Skin is warm and dry.     Findings: No erythema or rash.  Neurological:     Mental Status: She is alert and oriented to person, place, and time.     Cranial Nerves: No cranial nerve deficit.     Deep Tendon Reflexes: Reflexes are normal and symmetric.  Psychiatric:        Mood and Affect: Mood normal.  Behavior: Behavior normal.        Thought Content: Thought content normal.        Judgment: Judgment normal.         Assessment & Plan:   Problem List Items Addressed This Visit       Unprioritized   Hypertension   Relevant Orders   Comprehensive metabolic panel    Hemoglobin A1c   Type 2 diabetes mellitus with stage 3 chronic kidney disease, with long-term current use of insulin (HCC)    Per endo and nephrology      Relevant Orders   Hemoglobin A1c   Microalbumin / creatinine urine ratio (Completed)   Need for pneumococcal 20-valent conjugate vaccination   Relevant Orders   Pneumococcal conjugate vaccine 20-valent (Prevnar 20) (Completed)   Need for influenza vaccination   Relevant Orders   Flu Vaccine Trivalent High Dose (Fluad) (Completed)   Insomnia - Primary    Pt has tried otc with no results -- gabepentin not working  Trazadone 50 mg 1/2-1 po at bedtime as needed       Relevant Medications   traZODone (DESYREL) 50 MG tablet   Hyperlipidemia associated with type 2 diabetes mellitus (HCC)   Relevant Orders   Lipid panel   CBC with Differential/Platelet   HOCM (hypertrophic obstructive cardiomyopathy) (HCC)    Per cardiology      Essential hypertension    Well controlled, no changes to meds. Encouraged heart healthy diet such as the DASH diet and exercise as tolerated.        Chronic kidney disease, stage 3b (HCC)    Per nephrology      Chronic hypoxemic respiratory failure (HCC)    On 2L [place] Per pulmonary      Chronic diastolic heart failure [facility])    Per cardiology      Atherosclerosis of native coronary artery of native heart without angina pectoris    On a statin      Assessment and Plan    Type 2 Diabetes Mellitus   She has Type 2 Diabetes Mellitus with blood glucose levels consistently between 200-300 mg/dL despite her current regimen of Lantus, Januvia, and Farxiga. A recent steroid injection for knee arthritis likely worsened her hyperglycemia. We discussed adding short-acting insulin with an endocrinologist, considering the potential weight control complications. She will continue her current medications, consult a nutritionist for dietary management, and regularly monitor blood glucose levels.  Severe  Osteoarthritis of the Right Knee   She suffers from severe osteoarthritis in the right knee, with bone-on-bone contact. A steroid injection provided temporary relief but elevated blood glucose levels. She will follow up with an orthopedic specialist as needed and consider alternative pain management strategies to avoid steroids.  Chronic Obstructive Pulmonary Disease (COPD)   She is on 2 liters of oxygen and was recently hospitalized for breathing issues, currently managed by pulmonologist Dr. Frederick Peers. She will continue her current oxygen therapy and follow up with her pulmonologist as needed.  Chronic Kidney Disease   Her chronic kidney disease is managed by nephrologist Dr. Shaune Pascal, with recent labs showing a potassium level at 5.0 mmol/L and a water-restricted diet in place. She will continue monitoring kidney function with her nephrologist, avoid high-potassium foods, and request recent lab results be sent to her primary care provider.  Hypertrophic Cardiomyopathy   Her hypertrophic cardiomyopathy is managed by cardiologist Dr. Donalee Citrin at [facility], with no acute issues discussed during this visit. She will continue regular follow-ups  with her cardiologist.  Insomnia   She reports difficulty sleeping despite using gabapentin, with a racing mind and trouble staying asleep, exacerbated by recent stress from the loss of her spouse. Gabapentin will be discontinued for sleep, and trazodone prescribed, starting with half a tablet and increasing to one if needed. A referral to a mental health professional for grief counseling will be considered.  General Health Maintenance   She is due for several vaccinations and screenings. We discussed the shingles vaccine, which is 97-98% effective with two shots 2-6 months apart, and the RSV vaccine, recommended due to her age and COPD. She will receive the flu shot and pneumonia shot today, and we will ensure her MyChart account is set up for  communication.  Follow-up   A follow-up appointment is scheduled in three months, with comprehensive blood work ordered today. We will ensure records from her nephrologist are sent to primary care.      D/w pt her insomnia, diet and complicated medical hx ,  reviewed [facility] in care everywhere  45 min with >50% time face to face   Return in about 3 months (around 02/23/2023) for hypertension, hyperlipidemia, diabetes II.   Donato Schultz, DO

## 2022-11-23 NOTE — Patient Instructions (Signed)
Insomnia Insomnia is a sleep disorder that makes it difficult to fall asleep or stay asleep. Insomnia can cause fatigue, low energy, difficulty concentrating, mood swings, and poor performance at work or school. There are three different ways to classify insomnia: Difficulty falling asleep. Difficulty staying asleep. Waking up too early in the morning. Any type of insomnia can be long-term (chronic) or short-term (acute). Both are common. Short-term insomnia usually lasts for 3 months or less. Chronic insomnia occurs at least three times a week for longer than 3 months. What are the causes? Insomnia may be caused by another condition, situation, or substance, such as: Having certain mental health conditions, such as anxiety and depression. Using caffeine, alcohol, tobacco, or drugs. Having gastrointestinal conditions, such as gastroesophageal reflux disease (GERD). Having certain medical conditions. These include: Asthma. Alzheimer's disease. Stroke. Chronic pain. An overactive thyroid gland (hyperthyroidism). Other sleep disorders, such as restless legs syndrome and sleep apnea. Menopause. Sometimes, the cause of insomnia may not be known. What increases the risk? Risk factors for insomnia include: Gender. Females are affected more often than males. Age. Insomnia is more common as people get older. Stress and certain medical and mental health conditions. Lack of exercise. Having an irregular work schedule. This may include working night shifts and traveling between different time zones. What are the signs or symptoms? If you have insomnia, the main symptom is having trouble falling asleep or having trouble staying asleep. This may lead to other symptoms, such as: Feeling tired or having low energy. Feeling nervous about going to sleep. Not feeling rested in the morning. Having trouble concentrating. Feeling irritable, anxious, or depressed. How is this diagnosed? This condition  may be diagnosed based on: Your symptoms and medical history. Your health care provider may ask about: Your sleep habits. Any medical conditions you have. Your mental health. A physical exam. How is this treated? Treatment for insomnia depends on the cause. Treatment may focus on treating an underlying condition that is causing the insomnia. Treatment may also include: Medicines to help you sleep. Counseling or therapy. Lifestyle adjustments to help you sleep better. Follow these instructions at home: Eating and drinking  Limit or avoid alcohol, caffeinated beverages, and products that contain nicotine and tobacco, especially close to bedtime. These can disrupt your sleep. Do not eat a large meal or eat spicy foods right before bedtime. This can lead to digestive discomfort that can make it hard for you to sleep. Sleep habits  Keep a sleep diary to help you and your health care provider figure out what could be causing your insomnia. Write down: When you sleep. When you wake up during the night. How well you sleep and how rested you feel the next day. Any side effects of medicines you are taking. What you eat and drink. Make your bedroom a dark, comfortable place where it is easy to fall asleep. Put up shades or blackout curtains to block light from outside. Use a white noise machine to block noise. Keep the temperature cool. Limit screen use before bedtime. This includes: Not watching TV. Not using your smartphone, tablet, or computer. Stick to a routine that includes going to bed and waking up at the same times every day and night. This can help you fall asleep faster. Consider making a quiet activity, such as reading, part of your nighttime routine. Try to avoid taking naps during the day so that you sleep better at night. Get out of bed if you are still awake after   15 minutes of trying to sleep. Keep the lights down, but try reading or doing a quiet activity. When you feel  sleepy, go back to bed. General instructions Take over-the-counter and prescription medicines only as told by your health care provider. Exercise regularly as told by your health care provider. However, avoid exercising in the hours right before bedtime. Use relaxation techniques to manage stress. Ask your health care provider to suggest some techniques that may work well for you. These may include: Breathing exercises. Routines to release muscle tension. Visualizing peaceful scenes. Make sure that you drive carefully. Do not drive if you feel very sleepy. Keep all follow-up visits. This is important. Contact a health care provider if: You are tired throughout the day. You have trouble in your daily routine due to sleepiness. You continue to have sleep problems, or your sleep problems get worse. Get help right away if: You have thoughts about hurting yourself or someone else. Get help right away if you feel like you may hurt yourself or others, or have thoughts about taking your own life. Go to your nearest emergency room or: Call 911. Call the National Suicide Prevention Lifeline at 1-800-273-8255 or 988. This is open 24 hours a day. Text the Crisis Text Line at 741741. Summary Insomnia is a sleep disorder that makes it difficult to fall asleep or stay asleep. Insomnia can be long-term (chronic) or short-term (acute). Treatment for insomnia depends on the cause. Treatment may focus on treating an underlying condition that is causing the insomnia. Keep a sleep diary to help you and your health care provider figure out what could be causing your insomnia. This information is not intended to replace advice given to you by your health care provider. Make sure you discuss any questions you have with your health care provider. Document Revised: 11/29/2020 Document Reviewed: 11/29/2020 Elsevier Patient Education  2024 [employer]  

## 2022-11-23 NOTE — Assessment & Plan Note (Signed)
Well controlled, no changes to meds. Encouraged heart healthy diet such as the DASH diet and exercise as tolerated.  °

## 2022-11-23 NOTE — Assessment & Plan Note (Signed)
Per cardiology 

## 2022-11-23 NOTE — Assessment & Plan Note (Signed)
Pt has tried otc with no results -- gabepentin not working  Trazadone 50 mg 1/2-1 po at bedtime as needed

## 2022-11-23 NOTE — Assessment & Plan Note (Signed)
On a statin 

## 2022-11-23 NOTE — Assessment & Plan Note (Signed)
Per endo and nephrology

## 2022-11-23 NOTE — Assessment & Plan Note (Signed)
Per nephrology 

## 2022-11-23 NOTE — Assessment & Plan Note (Signed)
On 2L [place] Per pulmonary

## 2022-11-24 ENCOUNTER — Telehealth: Payer: Self-pay | Admitting: Family Medicine

## 2022-11-24 NOTE — Telephone Encounter (Signed)
Patient states was seen yesterday and was placed on a new medication and has some question with the gabapentin. Please call

## 2022-11-27 NOTE — Telephone Encounter (Signed)
Pt called back to follow-up. Please advise.

## 2022-11-27 NOTE — Telephone Encounter (Signed)
Pt called. Confirmed with Lowne that patient should stop taking Gabapentin and start the Trazodone.

## 2022-11-29 DIAGNOSIS — H903 Sensorineural hearing loss, bilateral: Secondary | ICD-10-CM | POA: Diagnosis not present

## 2022-12-07 DIAGNOSIS — N183 Chronic kidney disease, stage 3 unspecified: Secondary | ICD-10-CM | POA: Diagnosis not present

## 2022-12-07 DIAGNOSIS — H524 Presbyopia: Secondary | ICD-10-CM | POA: Diagnosis not present

## 2022-12-07 DIAGNOSIS — I10 Essential (primary) hypertension: Secondary | ICD-10-CM | POA: Diagnosis not present

## 2022-12-08 ENCOUNTER — Other Ambulatory Visit: Payer: Self-pay

## 2022-12-08 DIAGNOSIS — N289 Disorder of kidney and ureter, unspecified: Secondary | ICD-10-CM

## 2022-12-19 ENCOUNTER — Encounter: Payer: Self-pay | Admitting: Family Medicine

## 2022-12-21 MED ORDER — DOXEPIN HCL 6 MG PO TABS: 6.0000 mg | ORAL_TABLET | Freq: Every day | ORAL | 1 refills | Status: DC

## 2022-12-25 NOTE — Telephone Encounter (Signed)
[facility] faxed back rx stating insurance does not cover Doxepin.  Preferred alternative is Belsomra or Zolpidem Tartrate    Please advise

## 2023-01-01 NOTE — Telephone Encounter (Signed)
Copied from CRM [id number]. Topic: Clinical - Prescription Issue >> Jan 01, 2023 10:56 AM Morgan Fischer wrote: Reason for CRM: Patient stated she needs a prior authorization  for medication Doxepin HCl 6 MG TABS [[id number]]

## 2023-01-01 NOTE — Telephone Encounter (Signed)
Have we received request for PA?

## 2023-01-02 NOTE — Telephone Encounter (Signed)
 Note on 12-23 states possible change to Zolpidem or Belsomra; these are preferred alternatives. Did the patient not want to try these?

## 2023-01-08 ENCOUNTER — Telehealth: Payer: Self-pay | Admitting: *Deleted

## 2023-01-08 MED ORDER — MONTELUKAST SODIUM 10 MG PO TABS: 10.0000 mg | ORAL_TABLET | Freq: Every day | ORAL | 1 refills | Status: DC

## 2023-01-08 NOTE — Telephone Encounter (Signed)
 We never filled for here.  Ok to fill?

## 2023-01-11 ENCOUNTER — Other Ambulatory Visit: Payer: Self-pay | Admitting: Family Medicine

## 2023-01-11 DIAGNOSIS — G4700 Insomnia, unspecified: Secondary | ICD-10-CM

## 2023-01-11 MED ORDER — BELSOMRA 10 MG PO TABS: 10.0000 mg | ORAL_TABLET | Freq: Every evening | ORAL | 0 refills | Status: DC | PRN

## 2023-01-12 DIAGNOSIS — E119 Type 2 diabetes mellitus without complications: Secondary | ICD-10-CM | POA: Diagnosis not present

## 2023-01-12 DIAGNOSIS — Z978 Presence of other specified devices: Secondary | ICD-10-CM | POA: Diagnosis not present

## 2023-01-16 DIAGNOSIS — E1165 Type 2 diabetes mellitus with hyperglycemia: Secondary | ICD-10-CM | POA: Diagnosis not present

## 2023-01-21 ENCOUNTER — Telehealth: Payer: Medicare HMO | Admitting: Physician Assistant

## 2023-01-21 DIAGNOSIS — L089 Local infection of the skin and subcutaneous tissue, unspecified: Secondary | ICD-10-CM | POA: Diagnosis not present

## 2023-01-21 MED ORDER — CEPHALEXIN 500 MG PO CAPS: 500.0000 mg | ORAL_CAPSULE | Freq: Two times a day (BID) | ORAL | 0 refills | Status: AC

## 2023-01-21 NOTE — Progress Notes (Signed)
Virtual Visit Consent   Morgan Fischer, you are scheduled for a virtual visit with a [facility] provider today. Just as with appointments in the office, your consent must be obtained to participate. Your consent will be active for this visit and any virtual visit you may have with one of our providers in the next 365 days. If you have a MyChart account, a copy of this consent can be sent to you electronically.  As this is a virtual visit, video technology does not allow for your provider to perform a traditional examination. This may limit your provider's ability to fully assess your condition. If your provider identifies any concerns that need to be evaluated in person or the need to arrange testing (such as labs, EKG, etc.), we will make arrangements to do so. Although advances in technology are sophisticated, we cannot ensure that it will always work on either your end or our end. If the connection with a video visit is poor, the visit may have to be switched to a telephone visit. With either a video or telephone visit, we are not always able to ensure that we have a secure connection.  By engaging in this virtual visit, you consent to the provision of healthcare and authorize for your insurance to be billed (if applicable) for the services provided during this visit. Depending on your insurance coverage, you may receive a charge related to this service.  I need to obtain your verbal consent now. Are you willing to proceed with your visit today? Morgan Fischer has provided verbal consent on 01/21/2023 for a virtual visit (video or telephone). Tylene Fantasia Ward, PA-C  Date: 01/21/2023 10:18 AM  Virtual Visit via Video Note   I, Tylene Fantasia Ward, connected with  Morgan Fischer  ([id number], 1950/03/22) on 01/21/23 at 10:15 AM EST by a video-enabled telemedicine application and verified that I am speaking with the correct person using two identifiers.  Location: Patient: Virtual Visit Location Patient:  Home Provider: Virtual Visit Location Provider: Home Office   I discussed the limitations of evaluation and management by telemedicine and the availability of in person appointments. The patient expressed understanding and agreed to proceed.    History of Present Illness: Morgan Fischer is a 73 y.o. who identifies as a female who was assigned female at birth, and is being seen today for an area of swelling, redness, and drainage on her right upper abdomen that started a few days ago.  Reports are is mildly painful.  Reports noting some drainage.  Denies fever, chills, n/v/d.  She reports skin infections in the past similar to this and was worried about it.  She is diabetic.  HPI: HPI  Problems:  Patient Active Problem List   Diagnosis Date Noted   Need for pneumococcal 20-valent conjugate vaccination 11/23/2022   Need for influenza vaccination 11/23/2022   Insomnia 11/23/2022   Hyperlipidemia associated with type 2 diabetes mellitus (HCC) 11/23/2022   Chronic kidney disease, stage 3b (HCC) 02/04/2021   Long term (current) use of insulin (HCC) 02/04/2021   Mild intermittent asthma 02/04/2021   Hypertrophic cardiomyopathy (HCC) 04/12/2020   Type 2 diabetes mellitus without complication, without long-term current use of insulin (HCC) 04/12/2020   Anxiety 03/15/2020   Cardiomyopathy (HCC) 03/15/2020   Iron deficiency 03/15/2020   Osteoarthritis of knee 03/15/2020   Peripheral venous insufficiency 03/15/2020   Hypertensive heart disease without congestive heart failure 03/15/2020   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   Hyperlipidemia, unspecified 03/15/2020   Mixed  hyperlipidemia 03/15/2020   Type 2 diabetes mellitus without complications (HCC) 03/15/2020   Acute respiratory failure (HCC) 03/08/2020   COPD with acute exacerbation (HCC) 03/08/2020   Acute on chronic respiratory failure with hypoxia and hypercapnia (HCC) 03/08/2020   Acute on chronic diastolic CHF (congestive heart failure)  (HCC) 03/08/2020   Obesity hypoventilation syndrome (HCC) 03/08/2020   Arthritis    Diabetes mellitus without complication (HCC)    High cholesterol    Acute diastolic heart failure (HCC) 07/17/2019   Chronic hypoxemic respiratory failure (HCC)    CKD (chronic kidney disease)    Chest pain 07/14/2019   Aortic stenosis 11/01/2018   Heart block AV complete (HCC)    Acute on chronic respiratory failure with hypoxia (HCC) 10/20/2018   HOCM (hypertrophic obstructive cardiomyopathy) (HCC) 09/18/2017   Pure hypercholesterolemia 02/13/2017   CHF (congestive heart failure) (HCC) 12/22/2016   Type 2 diabetes mellitus with stage 3 chronic kidney disease, with long-term current use of insulin (HCC) 11/19/2016   Essential hypertension 11/19/2016   Gout 11/19/2016   Obstructive sleep apnea 11/19/2016   Essential (primary) hypertension 11/19/2016   Acute hypercapnic respiratory failure (HCC) 07/21/2016   Drug allergy 07/21/2016   Morbid obesity with BMI of 60.0-69.9, adult (HCC) 07/21/2016   Acute on chronic congestive heart failure (HCC) 07/19/2016   Acute respiratory acidosis (HCC) 07/19/2016   Acute respiratory distress 07/19/2016   Type 2 diabetes mellitus with hyperglycemia (HCC) 07/19/2016   Leukocytosis 07/19/2016   Chronic diastolic heart failure (HCC) 03/17/2016   Hypertensive heart disease with heart failure (HCC) 03/17/2016   Hypoxia 03/17/2016   Atherosclerosis of native coronary artery of native heart without angina pectoris 03/17/2016   Nonrheumatic aortic valve stenosis 03/17/2016   Troponin level elevated 08/24/2015   SOB (shortness of breath) 08/24/2015   Hypokalemia 08/24/2015   Morbid obesity (HCC)    Hypertension    Asthma    Hypomagnesemia    Panniculitis 08/23/2015    Allergies:  Allergies  Allergen Reactions   Atorvastatin Anaphylaxis and Swelling    Lip swelling    Ciprofloxacin Anaphylaxis    Kidney failure   Ciprocinonide [Fluocinolone] Other (See  Comments)    Kidney failure   Catapres [Clonidine Hcl] Rash   Clonidine Rash   Demadex [Torsemide] Rash   Medications:  Current Outpatient Medications:    allopurinol (ZYLOPRIM) 300 MG tablet, Take 300 mg by mouth daily., Disp: , Rfl:    aspirin EC 81 MG EC tablet, Take 1 tablet (81 mg total) by mouth daily., Disp: 120 tablet, Rfl: 0   Cholecalciferol 50 MCG (2000 UT) CAPS, Take 2,000 Units by mouth daily., Disp: , Rfl:    Cyanocobalamin (VITAMIN B-12 PO), Take 1 tablet by mouth daily., Disp: , Rfl:    dapagliflozin propanediol (FARXIGA) 10 MG TABS tablet, Take 10 mg by mouth daily., Disp: , Rfl:    diclofenac Sodium (VOLTAREN) 1 % GEL, Apply 1 application topically as needed for pain (pain)., Disp: , Rfl:    disopyramide (NORPACE CR) 100 MG 12 hr capsule, Take 200 mg by mouth every 12 (twelve) hours., Disp: , Rfl:    Doxepin HCl 6 MG TABS, Take 1 tablet (6 mg total) by mouth at bedtime., Disp: 30 tablet, Rfl: 1   ferrous sulfate 325 (65 FE) MG EC tablet, Take 1 tablet by mouth daily., Disp: , Rfl:    fluticasone furoate-vilanterol (BREO ELLIPTA) 100-25 MCG/ACT AEPB, Inhale 1 puff into the lungs daily., Disp: , Rfl:  gabapentin (NEURONTIN) 100 MG capsule, Take 200 mg by mouth at bedtime., Disp: , Rfl:    Garlic 1000 MG CAPS, Take 1,000 mg by mouth daily., Disp: , Rfl:    hydrochlorothiazide (MICROZIDE) 12.5 MG capsule, Take 12.5 mg by mouth daily., Disp: , Rfl:    ipratropium-albuterol (DUONEB) 0.5-2.5 (3) MG/3ML SOLN, Inhale 3 mLs into the lungs every 4 (four) hours as needed for wheezing (wheezing)., Disp: , Rfl:    LANTUS SOLOSTAR 100 UNIT/ML Solostar Pen, Inject 15 Units into the skin at bedtime., Disp: , Rfl:    losartan (COZAAR) 100 MG tablet, Take 1 tablet by mouth daily., Disp: , Rfl:    metoprolol succinate (TOPROL-XL) 50 MG 24 hr tablet, Take 1 tablet (50 mg total) by mouth daily., Disp: 90 tablet, Rfl: 3   montelukast (SINGULAIR) 10 MG tablet, Take 1 tablet (10 mg total) by  mouth at bedtime., Disp: 90 tablet, Rfl: 1   Multiple Vitamin (MULTIVITAMIN WITH MINERALS) TABS tablet, Take 1 tablet by mouth daily., Disp: , Rfl:    Omega-3 Fatty Acids (FISH OIL) 1000 MG CAPS, Take 1,000 mg by mouth 2 (two) times daily. , Disp: , Rfl:    OXYGEN, Inhale 2 L into the lungs continuous., Disp: , Rfl:    polyethylene glycol (MIRALAX / GLYCOLAX) 17 g packet, Take 17 g by mouth as needed for mild constipation., Disp: , Rfl:    Probiotic Product (PROBIOTIC PO), Take 1 tablet by mouth daily in the afternoon., Disp: , Rfl:    rosuvastatin (CRESTOR) 40 MG tablet, Take 40 mg by mouth at bedtime., Disp: , Rfl:    Suvorexant (BELSOMRA) 10 MG TABS, Take 1 tablet (10 mg total) by mouth at bedtime as needed., Disp: 30 tablet, Rfl: 0   Turmeric 500 MG CAPS, Take 1 capsule by mouth daily., Disp: , Rfl:    VENTOLIN HFA 108 (90 Base) MCG/ACT inhaler, Inhale 2 puffs into the lungs every 6 (six) hours as needed for wheezing or shortness of breath. , Disp: , Rfl:   Observations/Objective: Patient is well-developed, well-nourished in no acute distress.  Resting comfortably at home.  Head is normocephalic, atraumatic.  No labored breathing.  Speech is clear and coherent with logical content.  Patient is alert and oriented at baseline.    Assessment and Plan: 1. Skin infection (Primary)  Hard to visualize, but there appears to be a small scratch over the area. Given pt h/o drainage and swelling will send in antibiotic.  Pt denies fever, chills.  Advised in person follow up if sx become worse or no improvement.   Follow Up Instructions: I discussed the assessment and treatment plan with the patient. The patient was provided an opportunity to ask questions and all were answered. The patient agreed with the plan and demonstrated an understanding of the instructions.  A copy of instructions were sent to the patient via MyChart unless otherwise noted below.     The patient was advised to call back  or seek an in-person evaluation if the symptoms worsen or if the condition fails to improve as anticipated.    Tylene Fantasia Ward, PA-C

## 2023-01-21 NOTE — Patient Instructions (Signed)
[name], thank you for joining Tylene Fantasia Ward, PA-C for today's virtual visit.  While this provider is not your primary care provider (PCP), if your PCP is located in our provider database this encounter information will be shared with them immediately following your visit.   A [facility] MyChart account gives you access to today's visit and all your visits, tests, and labs performed at [facility] " click here if you don't have a  MyChart account or go to [email or web address]  Consent: (Patient) Morgan Fischer provided verbal consent for this virtual visit at the beginning of the encounter.  Current Medications:  Current Outpatient Medications:    cephALEXin (KEFLEX) 500 MG capsule, Take 1 capsule (500 mg total) by mouth 2 (two) times daily for 5 days., Disp: 10 capsule, Rfl: 0   allopurinol (ZYLOPRIM) 300 MG tablet, Take 300 mg by mouth daily., Disp: , Rfl:    aspirin EC 81 MG EC tablet, Take 1 tablet (81 mg total) by mouth daily., Disp: 120 tablet, Rfl: 0   Cholecalciferol 50 MCG (2000 UT) CAPS, Take 2,000 Units by mouth daily., Disp: , Rfl:    Cyanocobalamin (VITAMIN B-12 PO), Take 1 tablet by mouth daily., Disp: , Rfl:    dapagliflozin propanediol (FARXIGA) 10 MG TABS tablet, Take 10 mg by mouth daily., Disp: , Rfl:    diclofenac Sodium (VOLTAREN) 1 % GEL, Apply 1 application topically as needed for pain (pain)., Disp: , Rfl:    disopyramide (NORPACE CR) 100 MG 12 hr capsule, Take 200 mg by mouth every 12 (twelve) hours., Disp: , Rfl:    Doxepin HCl 6 MG TABS, Take 1 tablet (6 mg total) by mouth at bedtime., Disp: 30 tablet, Rfl: 1   ferrous sulfate 325 (65 FE) MG EC tablet, Take 1 tablet by mouth daily., Disp: , Rfl:    fluticasone furoate-vilanterol (BREO ELLIPTA) 100-25 MCG/ACT AEPB, Inhale 1 puff into the lungs daily., Disp: , Rfl:    gabapentin (NEURONTIN) 100 MG capsule, Take 200 mg by mouth at bedtime., Disp: , Rfl:    Garlic 1000 MG CAPS, Take  1,000 mg by mouth daily., Disp: , Rfl:    hydrochlorothiazide (MICROZIDE) 12.5 MG capsule, Take 12.5 mg by mouth daily., Disp: , Rfl:    ipratropium-albuterol (DUONEB) 0.5-2.5 (3) MG/3ML SOLN, Inhale 3 mLs into the lungs every 4 (four) hours as needed for wheezing (wheezing)., Disp: , Rfl:    LANTUS SOLOSTAR 100 UNIT/ML Solostar Pen, Inject 15 Units into the skin at bedtime., Disp: , Rfl:    losartan (COZAAR) 100 MG tablet, Take 1 tablet by mouth daily., Disp: , Rfl:    metoprolol succinate (TOPROL-XL) 50 MG 24 hr tablet, Take 1 tablet (50 mg total) by mouth daily., Disp: 90 tablet, Rfl: 3   montelukast (SINGULAIR) 10 MG tablet, Take 1 tablet (10 mg total) by mouth at bedtime., Disp: 90 tablet, Rfl: 1   Multiple Vitamin (MULTIVITAMIN WITH MINERALS) TABS tablet, Take 1 tablet by mouth daily., Disp: , Rfl:    Omega-3 Fatty Acids (FISH OIL) 1000 MG CAPS, Take 1,000 mg by mouth 2 (two) times daily. , Disp: , Rfl:    OXYGEN, Inhale 2 L into the lungs continuous., Disp: , Rfl:    polyethylene glycol (MIRALAX / GLYCOLAX) 17 g packet, Take 17 g by mouth as needed for mild constipation., Disp: , Rfl:    Probiotic Product (PROBIOTIC PO), Take 1 tablet by mouth daily in the afternoon., Disp: , Rfl:  rosuvastatin (CRESTOR) 40 MG tablet, Take 40 mg by mouth at bedtime., Disp: , Rfl:    Suvorexant (BELSOMRA) 10 MG TABS, Take 1 tablet (10 mg total) by mouth at bedtime as needed., Disp: 30 tablet, Rfl: 0   Turmeric 500 MG CAPS, Take 1 capsule by mouth daily., Disp: , Rfl:    VENTOLIN HFA 108 (90 Base) MCG/ACT inhaler, Inhale 2 puffs into the lungs every 6 (six) hours as needed for wheezing or shortness of breath. , Disp: , Rfl:    Medications ordered in this encounter:  Meds ordered this encounter  Medications   cephALEXin (KEFLEX) 500 MG capsule    Sig: Take 1 capsule (500 mg total) by mouth 2 (two) times daily for 5 days.    Dispense:  10 capsule    Refill:  0    Supervising Provider:   Merrilee Jansky  [[id number]]     *If you need refills on other medications prior to your next appointment, please contact your pharmacy*  Follow-Up: Call back or seek an in-person evaluation if the symptoms worsen or if the condition fails to improve as anticipated.  [facility] [phone]  Other Instructions Take antibiotic as prescribed.  Apply warm compress and topical antibiotic ointment.  If no improvement or symptoms become worse follow up for in person evaluation with your Primary Care Physician or Urgent Care.    If you have been instructed to have an in-person evaluation today at a local Urgent Care facility, please use the link below. It will take you to a list of all of our available [facility], including address, phone number and hours of operation. Please do not delay care.  [facility] [facility]  If you or a family member do not have a primary care provider, use the link below to schedule a visit and establish care. When you choose a [facility] primary care physician or advanced practice provider, you gain a long-term partner in health. Find a Primary Care Provider  Learn more about [facility]'s in-office and virtual care options: [facility] - Get Care Now

## 2023-01-31 ENCOUNTER — Telehealth: Payer: Self-pay

## 2023-01-31 NOTE — Telephone Encounter (Signed)
Moved to new encounter

## 2023-01-31 NOTE — Telephone Encounter (Signed)
Copied from CRM [id number]. Topic: Clinical - Medical Advice >> Jan 31, 2023  9:41 AM Denese Killings wrote: Reason for CRM: Patient stated that she has been on Suvorexant (BELSOMRA) 10 MG TABS medication for 10 days and it is not helping. Patient states that she only sleep for about 2 hours and then is up for the rest of the night. Patient wants to be prescribed something else.

## 2023-01-31 NOTE — Telephone Encounter (Signed)
Patient needs question answered regarding sleep medication, call patient Arliss Journey  AU   01/31/23 11:25 AM Unsigned Note Copied from CRM [id number]. Topic: Clinical - Medical Advice >> Jan 31, 2023  9:41 AM Denese Killings wrote: Reason for CRM: Patient stated that she has been on Suvorexant (BELSOMRA) 10 MG TABS medication for 10 days and it is not helping. Patient states that she only sleep for about 2 hours and then is up for the rest of the night. Patient wants to be prescribed something else.

## 2023-01-31 NOTE — Telephone Encounter (Signed)
Pt called. LDVM

## 2023-02-01 DIAGNOSIS — J45909 Unspecified asthma, uncomplicated: Secondary | ICD-10-CM | POA: Diagnosis not present

## 2023-02-01 DIAGNOSIS — G4730 Sleep apnea, unspecified: Secondary | ICD-10-CM | POA: Diagnosis not present

## 2023-02-01 DIAGNOSIS — E669 Obesity, unspecified: Secondary | ICD-10-CM | POA: Diagnosis not present

## 2023-02-01 DIAGNOSIS — I10 Essential (primary) hypertension: Secondary | ICD-10-CM | POA: Diagnosis not present

## 2023-02-01 DIAGNOSIS — I2781 Cor pulmonale (chronic): Secondary | ICD-10-CM | POA: Diagnosis not present

## 2023-02-01 DIAGNOSIS — J449 Chronic obstructive pulmonary disease, unspecified: Secondary | ICD-10-CM | POA: Diagnosis not present

## 2023-02-06 DIAGNOSIS — Z7984 Long term (current) use of oral hypoglycemic drugs: Secondary | ICD-10-CM | POA: Diagnosis not present

## 2023-02-06 DIAGNOSIS — I10 Essential (primary) hypertension: Secondary | ICD-10-CM | POA: Diagnosis not present

## 2023-02-06 DIAGNOSIS — Z7902 Long term (current) use of antithrombotics/antiplatelets: Secondary | ICD-10-CM | POA: Diagnosis not present

## 2023-02-06 DIAGNOSIS — E785 Hyperlipidemia, unspecified: Secondary | ICD-10-CM | POA: Diagnosis not present

## 2023-02-06 DIAGNOSIS — E1165 Type 2 diabetes mellitus with hyperglycemia: Secondary | ICD-10-CM | POA: Diagnosis not present

## 2023-02-06 DIAGNOSIS — Z794 Long term (current) use of insulin: Secondary | ICD-10-CM | POA: Diagnosis not present

## 2023-02-07 ENCOUNTER — Other Ambulatory Visit: Payer: Self-pay | Admitting: Family Medicine

## 2023-02-07 DIAGNOSIS — G4700 Insomnia, unspecified: Secondary | ICD-10-CM

## 2023-02-08 ENCOUNTER — Encounter: Payer: Self-pay | Admitting: Family Medicine

## 2023-02-08 ENCOUNTER — Other Ambulatory Visit: Payer: Self-pay | Admitting: Family Medicine

## 2023-02-08 DIAGNOSIS — G4700 Insomnia, unspecified: Secondary | ICD-10-CM

## 2023-02-08 MED ORDER — BELSOMRA 20 MG PO TABS: 20.0000 mg | ORAL_TABLET | Freq: Every evening | ORAL | 3 refills | Status: DC | PRN

## 2023-02-08 NOTE — Telephone Encounter (Signed)
 Requesting: Suvorexant  10 mg  Contract: N/A UDS: N/A Last Visit: 11/23/2022 Next Visit: 02/27/2023  Last Refill: 01/11/2023  Please Advise

## 2023-02-13 ENCOUNTER — Other Ambulatory Visit: Payer: Self-pay | Admitting: Family Medicine

## 2023-02-13 ENCOUNTER — Ambulatory Visit (INDEPENDENT_AMBULATORY_CARE_PROVIDER_SITE_OTHER): Payer: Medicare HMO

## 2023-02-13 VITALS — Ht 63.6 in | Wt 283.0 lb

## 2023-02-13 DIAGNOSIS — Z0000 Encounter for general adult medical examination without abnormal findings: Secondary | ICD-10-CM | POA: Diagnosis not present

## 2023-02-13 NOTE — Progress Notes (Signed)
Subjective:   Morgan Fischer is a 73 y.o. female who presents for Medicare Annual (Subsequent) preventive examination.  Visit Complete: Virtual I connected with  Morgan Fischer on 02/13/23 by a video and audio enabled telemedicine application and verified that I am speaking with the correct person using two identifiers.  Patient Location: Home  Provider Location: Home Office  I discussed the limitations of evaluation and management by telemedicine. The patient expressed understanding and agreed to proceed.  Vital Signs: Because this visit was a virtual/telehealth visit, some criteria may be missing or patient reported. Any vitals not documented were not able to be obtained and vitals that have been documented are patient reported.  Patient Medicare AWV questionnaire was completed by the patient on 02/12/23; I have confirmed that all information answered by patient is correct and no changes since this date.  Cardiac Risk Factors include: advanced age (>23men, >70 women);diabetes mellitus;hypertension     Objective:    Today's Vitals   02/13/23 1052  Weight: 283 lb (128.4 kg)  Height: 5' 3.6" (1.615 m)   Body mass index is 49.19 kg/m.     02/13/2023   11:14 AM 04/19/2021   10:24 AM 11/22/2020   12:18 PM 07/20/2020   11:52 AM 04/26/2020   11:15 AM 03/15/2020    9:00 AM 03/08/2020   12:54 PM  Advanced Directives  Does Patient Have a Medical Advance Directive? No No No No No No No  Would patient like information on creating a medical advance directive? No - Patient declined No - Patient declined No - Patient declined No - Patient declined No - Patient declined No - Patient declined No - Patient declined    Current Medications (verified) Outpatient Encounter Medications as of 02/13/2023  Medication Sig   rosuvastatin (CRESTOR) 40 MG tablet Take 40 mg by mouth at bedtime. (Patient taking differently: Take 10 mg by mouth at bedtime.)   allopurinol (ZYLOPRIM) 300 MG tablet Take 300 mg by  mouth daily. (Patient taking differently: Take 100 mg by mouth daily.)   aspirin EC 81 MG EC tablet Take 1 tablet (81 mg total) by mouth daily.   Cholecalciferol 50 MCG (2000 UT) CAPS Take 2,000 Units by mouth daily.   Cyanocobalamin (VITAMIN B-12 PO) Take 1 tablet by mouth daily.   dapagliflozin propanediol (FARXIGA) 10 MG TABS tablet Take 10 mg by mouth daily.   diclofenac Sodium (VOLTAREN) 1 % GEL Apply 1 application topically as needed for pain (pain).   disopyramide (NORPACE CR) 100 MG 12 hr capsule Take 200 mg by mouth every 12 (twelve) hours. (Patient taking differently: Take 100 mg by mouth 3 (three) times daily.)   ferrous sulfate 325 (65 FE) MG EC tablet Take 1 tablet by mouth daily.   fluticasone furoate-vilanterol (BREO ELLIPTA) 100-25 MCG/ACT AEPB Inhale 1 puff into the lungs daily.   Garlic 1000 MG CAPS Take 1,000 mg by mouth daily.   hydrochlorothiazide (MICROZIDE) 12.5 MG capsule Take 12.5 mg by mouth daily. (Patient taking differently: Take 25 mg by mouth daily.)   ipratropium-albuterol (DUONEB) 0.5-2.5 (3) MG/3ML SOLN Inhale 3 mLs into the lungs every 4 (four) hours as needed for wheezing (wheezing).   LANTUS SOLOSTAR 100 UNIT/ML Solostar Pen Inject 15 Units into the skin at bedtime. (Patient taking differently: Inject 26 Units into the skin at bedtime.)   losartan (COZAAR) 100 MG tablet Take 1 tablet by mouth daily.   metoprolol succinate (TOPROL-XL) 50 MG 24 hr tablet Take 1 tablet (50 mg  total) by mouth daily.   montelukast (SINGULAIR) 10 MG tablet Take 1 tablet (10 mg total) by mouth at bedtime.   Multiple Vitamin (MULTIVITAMIN WITH MINERALS) TABS tablet Take 1 tablet by mouth daily.   Omega-3 Fatty Acids (FISH OIL) 1000 MG CAPS Take 1,000 mg by mouth 2 (two) times daily.    OXYGEN Inhale 2 L into the lungs continuous.   polyethylene glycol (MIRALAX / GLYCOLAX) 17 g packet Take 17 g by mouth as needed for mild constipation.   Probiotic Product (PROBIOTIC PO) Take 1 tablet by  mouth daily in the afternoon.   Suvorexant (BELSOMRA) 20 MG TABS Take 1 tablet (20 mg total) by mouth at bedtime as needed.   Turmeric 500 MG CAPS Take 1 capsule by mouth daily.   VENTOLIN HFA 108 (90 Base) MCG/ACT inhaler Inhale 2 puffs into the lungs every 6 (six) hours as needed for wheezing or shortness of breath.    [DISCONTINUED] Doxepin HCl 6 MG TABS Take 1 tablet (6 mg total) by mouth at bedtime.   [DISCONTINUED] gabapentin (NEURONTIN) 100 MG capsule Take 200 mg by mouth at bedtime.   No facility-administered encounter medications on file as of 02/13/2023.    Allergies (verified) Atorvastatin, Ciprofloxacin, Ciprocinonide [fluocinolone], Catapres [clonidine hcl], Clonidine, and Demadex [torsemide]   History: Past Medical History:  Diagnosis Date   Acute diastolic heart failure (HCC) 07/17/2019   Acute hypercapnic respiratory failure (HCC) 07/21/2016   Acute on chronic congestive heart failure (HCC) 07/19/2016   Acute on chronic diastolic CHF (congestive heart failure) (HCC) 03/08/2020   Acute on chronic respiratory failure with hypoxia (HCC) 10/20/2018   Acute on chronic respiratory failure with hypoxia and hypercapnia (HCC) 03/08/2020   Acute respiratory acidosis (HCC) 07/19/2016   Acute respiratory distress 07/19/2016   Acute respiratory failure (HCC) 03/08/2020   Anxiety 03/15/2020   Aortic stenosis 11/01/2018   Arthritis    Asthma    Atherosclerosis of native coronary artery of native heart without angina pectoris 03/17/2016   Cardiomyopathy (HCC) 03/15/2020   Chest pain 07/14/2019   CHF (congestive heart failure) (HCC)    Chronic diastolic heart failure (HCC) 03/17/2016   Chronic hypoxemic respiratory failure (HCC)    Chronic kidney disease, stage 3b (HCC) 02/04/2021   CKD (chronic kidney disease)    COPD (chronic obstructive pulmonary disease) (HCC)    COPD with acute exacerbation (HCC) 03/08/2020   Diabetes mellitus without complication (HCC)    Drug allergy  07/21/2016   Essential (primary) hypertension 11/19/2016   Essential hypertension 11/19/2016   Gout 11/19/2016   Heart block AV complete (HCC)    High cholesterol    HOCM (hypertrophic obstructive cardiomyopathy) (HCC) 09/18/2017   Hyperlipidemia, unspecified 03/15/2020   Hypertension    Hypertensive heart disease with heart failure (HCC) 03/17/2016   Hypertensive heart disease without congestive heart failure 03/15/2020   Hypertrophic cardiomyopathy (HCC) 04/12/2020   Hypokalemia 08/24/2015   Hypomagnesemia    Hypoxia 03/17/2016   Iron deficiency 03/15/2020   Leukocytosis 07/19/2016   Long term (current) use of insulin (HCC) 02/04/2021   Mild intermittent asthma 02/04/2021   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   Mixed hyperlipidemia 03/15/2020   Morbid obesity (HCC)    Morbid obesity with BMI of 60.0-69.9, adult (HCC) 07/21/2016   Nonrheumatic aortic valve stenosis 03/17/2016   Obesity hypoventilation syndrome (HCC) 03/08/2020   Obstructive sleep apnea 11/19/2016   Osteoarthritis of knee 03/15/2020   Panniculitis 08/23/2015   Peripheral venous insufficiency 03/15/2020   Pure hypercholesterolemia  02/13/2017   SOB (shortness of breath) 08/24/2015   Troponin level elevated 08/24/2015   Type 2 diabetes mellitus with hyperglycemia (HCC) 07/19/2016   Type 2 diabetes mellitus without complication, without long-term current use of insulin (HCC) 04/12/2020   Type 2 diabetes mellitus without complications (HCC) 03/15/2020   Type 2 diabetes mellitus, without long-term current use of insulin (HCC) 11/19/2016   Past Surgical History:  Procedure Laterality Date   ABDOMINAL HYSTERECTOMY     cellulitis debridement  2000   abdomen   CESAREAN SECTION     Family History  Problem Relation Age of Onset   Diabetes Mellitus I Mother    CAD Mother    Diabetes Mother    Heart disease Father    Cancer Father    Breast cancer Sister    Cancer Sister    Heart disease Sister    Prostate cancer  Brother    Social History   Socioeconomic History   Marital status: Widowed    Spouse name: Not on file   Number of children: Not on file   Years of education: Not on file   Highest education level: Bachelor's degree (e.g., BA, AB, BS)  Occupational History   Occupation: retired    Comment: Copywriter, 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  Tobacco Use   Smoking status: Never   Smokeless tobacco: Never  Vaping Use   Vaping status: Never Used  Substance and Sexual Activity   Alcohol use: No   Drug use: No   Sexual activity: Never  Other Topics Concern   Not on file  Social History Narrative   Not on file   Social Drivers of Health   Financial Resource Strain: Patient Declined (02/13/2023)   Overall Financial Resource Strain (CARDIA)    Difficulty of Paying Living Expenses: Patient declined  Food Insecurity: No Food Insecurity (02/13/2023)   Hunger Vital Sign    Worried About Running Out of Food in the Last Year: Never true    Ran Out of Food in the Last Year: Never true  Transportation Needs: No Transportation Needs (02/13/2023)   PRAPARE - Administrator, Civil Service (Medical): No    Lack of Transportation (Non-Medical): No  Physical Activity: Unknown (02/13/2023)   Exercise Vital Sign    Days of Exercise per Week: 0 days    Minutes of Exercise per Session: Not on file  Stress: No Stress Concern Present (02/13/2023)   [employer] of Occupational Health - Occupational Stress Questionnaire    Feeling of Stress : Only a little  Social Connections: Moderately Integrated (02/13/2023)   Social Connection and Isolation Panel [NHANES]    Frequency of Communication with Friends and Family: More than three times a week    Frequency of Social Gatherings with Friends and Family: More than three times a week    Attends Religious Services: More than 4 times per year    Active Member of [employer] or Organizations: Yes    Attends Banker Meetings: Patient  declined    Marital Status: Widowed    Tobacco Counseling Counseling given: Not Answered   Clinical Intake:  Pre-visit preparation completed: Yes  Pain : No/denies pain     BMI - recorded: 49.19 Nutritional Status: BMI > 30  Obese Nutritional Risks: None Diabetes: Yes CBG done?: No Did pt. bring in CBG monitor from home?: No  How often do you need to have someone help you when you read instructions, pamphlets, or other written  materials from your doctor or pharmacy?: 1 - Never  Interpreter Needed?: No  Information entered by :: Theresa Mulligan LPN   Activities of Daily Living    02/13/2023   11:11 AM 02/12/2023   10:02 AM  In your present state of health, do you have any difficulty performing the following activities:  Hearing? 1 0  Comment Wears Hearing Aids   Vision? 0 0  Difficulty concentrating or making decisions? 0 0  Walking or climbing stairs? 1 1  Comment Uses Walker, Cane and Wheelchair   Dressing or bathing? 0 0  Doing errands, shopping? 1 1  Comment Caregiver assist   Preparing Food and eating ? [name]  Comment Caregiver assist   Using the Toilet? N N  In the past six months, have you accidently leaked urine? N N  Do you have problems with loss of bowel control? N N  Managing your Medications? N N  Managing your Finances? N N  Housekeeping or managing your Housekeeping? [name]  Comment Caregiver assist     Patient Care Team: Zola Button, Grayling Congress, DO as PCP - General (Family Medicine) Revankar, Aundra Dubin, MD as PCP - Cardiology (Cardiology) Case, [place], MD as Referring Physician (Orthopedic Surgery) Nile Dear, DO as Referring Physician (Endocrinology) Mcneil Sober, MD as Referring Physician (Internal Medicine) Nolon Rod, MD as Referring Physician (Internal Medicine) Lysle Dingwall, DPM (Podiatry) Bethanie Dicker, MD (Pulmonary Disease)  Indicate any recent Medical Services you may have received from other than [facility] providers in the  past year (date may be approximate).     Assessment:   This is a routine wellness examination for Morgan Fischer.  Hearing/Vision screen Hearing Screening - Comments:: Wears Hearing Aids Vision Screening - Comments:: Wears rx glasses - up to date with routine eye exams with  [facility]   Goals Addressed               This Visit's Progress     Increase physical activity (pt-stated)        Stay Active.       Depression Screen    02/13/2023   11:09 AM 11/23/2022   10:08 AM 04/19/2021   10:22 AM 11/22/2020   12:14 PM 07/20/2020   11:51 AM 04/26/2020   11:16 AM 03/15/2020    9:01 AM  PHQ 2/9 Scores  PHQ - 2 Score 0 0 0 0 0 0 0    Fall Risk    02/13/2023   11:14 AM 02/12/2023   10:02 AM 11/23/2022   10:08 AM 04/19/2021   10:22 AM 11/22/2020   12:13 PM  Fall Risk   Falls in the past year? 0 0 0 0 0  Number falls in past yr: 0 0 0    Injury with Fall? 0 0 0    Risk for fall due to : No Fall Risks      Follow up Falls prevention discussed;Falls evaluation completed  Falls evaluation completed      MEDICARE RISK AT HOME: Medicare Risk at Home Any stairs in or around the home?: No If so, are there any without handrails?: No Home free of loose throw rugs in walkways, pet beds, electrical cords, etc?: Yes Adequate lighting in your home to reduce risk of falls?: Yes Life alert?: No Use of a cane, walker or w/c?: Yes Grab bars in the bathroom?: No Shower chair or bench in shower?: Yes Elevated toilet seat or a handicapped toilet?: Yes  TIMED UP AND GO:  Was the test performed?  No    Cognitive Function:        02/13/2023   11:15 AM  6CIT Screen  What Year? 0 points  What month? 0 points  What time? 0 points  Count back from 20 0 points  Months in reverse 0 points  Repeat phrase 0 points  Total Score 0 points    Immunizations Immunization History  Administered Date(s) Administered   Fluad Quad(high Dose 65+) 10/22/2018   Fluad Trivalent(High Dose 65+)  11/23/2022   Influenza-Unspecified 11/08/2016   Moderna Sars-Covid-2 Vaccination 07/28/2019, 08/25/2019   PNEUMOCOCCAL CONJUGATE-20 11/23/2022   Pneumococcal Polysaccharide-23 11/21/2016    TDAP status: Due, Education has been provided regarding the importance of this vaccine. Advised may receive this vaccine at local pharmacy or Health Dept. Aware to provide a copy of the vaccination record if obtained from local pharmacy or Health Dept. Verbalized acceptance and understanding.  Flu Vaccine status: Up to date  Pneumococcal vaccine status: Up to date   Covid-19 vaccine status: Declined, Education has been provided regarding the importance of this vaccine but patient still declined. Advised may receive this vaccine at local pharmacy or Health Dept.or vaccine clinic. Aware to provide a copy of the vaccination record if obtained from local pharmacy or Health Dept. Verbalized acceptance and understanding.  Qualifies for Shingles Vaccine? Yes   Zostavax completed No   Shingrix Completed?: No.    Education has been provided regarding the importance of this vaccine. Patient has been advised to call insurance company to determine out of pocket expense if they have not yet received this vaccine. Advised may also receive vaccine at local pharmacy or Health Dept. Verbalized acceptance and understanding.  Screening Tests Health Maintenance  Topic Date Due   FOOT EXAM  Never done   Hepatitis C Screening  Never done   DTaP/Tdap/Td (1 - Tdap) Never done   Zoster Vaccines- Shingrix (1 of 2) Never done   DEXA SCAN  Never done   COVID-19 Vaccine (3 - 2024-25 season) 09/03/2022   HEMOGLOBIN A1C  05/23/2023   Diabetic kidney evaluation - eGFR measurement  11/23/2023   Diabetic kidney evaluation - Urine ACR  11/23/2023   OPHTHALMOLOGY EXAM  01/03/2024   Medicare Annual Wellness (AWV)  02/13/2024   MAMMOGRAM  08/13/2024   Colonoscopy  01/19/2032   Pneumonia Vaccine 32+ Years old  Completed   INFLUENZA  VACCINE  Completed   HPV VACCINES  Aged Out    Health Maintenance  Health Maintenance Due  Topic Date Due   FOOT EXAM  Never done   Hepatitis C Screening  Never done   DTaP/Tdap/Td (1 - Tdap) Never done   Zoster Vaccines- Shingrix (1 of 2) Never done   DEXA SCAN  Never done   COVID-19 Vaccine (3 - 2024-25 season) 09/03/2022    Colorectal cancer screening: Type of screening: Colonoscopy. Completed 01/18/22. Repeat every 10 years  Mammogram status: Completed 08/14/22. Repeat every year  Bone Density status: Ordered Patient deferred. Pt provided with contact info and advised to call to schedule appt.     Additional Screening:  Hepatitis C Screening: does qualify;  Deferred  Vision Screening: Recommended annual ophthalmology exams for early detection of glaucoma and other disorders of the eye. Is the patient up to date with their annual eye exam?  Yes  Who is the provider or what is the name of the office in which the patient attends annual eye exams? [facility] If pt is  not established with a provider, would they like to be referred to a provider to establish care? No .   Dental Screening: Recommended annual dental exams for proper oral hygiene  Diabetic Foot Exam: Diabetic Foot Exam: Overdue, Pt has been advised about the importance in completing this exam. Pt is scheduled for diabetic foot exam on Deferred.  Community Resource Referral / Chronic Care Management:  CRR required this visit?  No   CCM required this visit?  No     Plan:     I have personally reviewed and noted the following in the patient's chart:   Medical and social history Use of alcohol, tobacco or illicit drugs  Current medications and supplements including opioid prescriptions. Patient is not currently taking opioid prescriptions. Functional ability and status Nutritional status Physical activity Advanced directives List of other physicians Hospitalizations, surgeries, and ER visits in  previous 12 months Vitals Screenings to include cognitive, depression, and falls Referrals and appointments  In addition, I have reviewed and discussed with patient certain preventive protocols, quality metrics, and best practice recommendations. A written personalized care plan for preventive services as well as general preventive health recommendations were provided to patient.     Tillie Rung, LPN   1/61/0960   After Visit Summary: (MyChart) Due to this being a telephonic visit, the after visit summary with patients personalized plan was offered to patient via MyChart   Nurse Notes: None

## 2023-02-13 NOTE — Patient Instructions (Addendum)
Ms. Doby , Thank you for taking time to come for your Medicare Wellness Visit. I appreciate your ongoing commitment to your health goals. Please review the following plan we discussed and let me know if I can assist you in the future.   Referrals/Orders/Follow-Ups/Clinician Recommendations: Follow up with Shingles Vaccine and [facility] regarding Dexa Scan Report.  This is a list of the screening recommended for you and due dates:  Health Maintenance  Topic Date Due   Complete foot exam   Never done   Hepatitis C Screening  Never done   DTaP/Tdap/Td vaccine (1 - Tdap) Never done   Zoster (Shingles) Vaccine (1 of 2) Never done   DEXA scan (bone density measurement)  Never done   COVID-19 Vaccine (3 - 2024-25 season) 09/03/2022   Hemoglobin A1C  05/23/2023   Yearly kidney function blood test for diabetes  11/23/2023   Yearly kidney health urinalysis for diabetes  11/23/2023   Eye exam for diabetics  01/03/2024   Medicare Annual Wellness Visit  02/13/2024   Mammogram  08/13/2024   Colon Cancer Screening  01/19/2032   Pneumonia Vaccine  Completed   Flu Shot  Completed   HPV Vaccine  Aged Out    Advanced directives: (Declined) Advance directive discussed with you today. Even though you declined this today, please call our office should you change your mind, and we can give you the proper paperwork for you to fill out.  Next Medicare Annual Wellness Visit scheduled for next year: Yes

## 2023-02-13 NOTE — Telephone Encounter (Signed)
Copied from CRM [id number]. Topic: Clinical - Medication Refill >> Feb 13, 2023  3:38 PM Pascal Lux wrote: Most Recent Primary Care Visit:  Provider: Tillie Rung  Department: [facility]  Visit Type: MEDICARE AWV, SEQUENTIAL  Date: 02/13/2023  Medication: losartan (COZAAR) 100 MG tablet [[id number]] and rosuvastatin (CRESTOR) 40 MG tablet [[id number]]  Has the patient contacted their pharmacy? Yes (Agent: If no, request that the patient contact the pharmacy for the refill. If patient does not wish to contact the pharmacy document the reason why and proceed with request.) (Agent: If yes, when and what did the pharmacy advise?) Pharmacy stated they will call us.  Is this the correct pharmacy for this prescription? Yes If no, delete pharmacy and type the correct one.  This is the patient's preferred pharmacy:  [id number], [place] - [address] AT [facility] [address] [place] [address] Phone: [phone] Fax: [phone]    Has the prescription been filled recently? No  Is the patient out of the medication? Yes  Has the patient been seen for an appointment in the last year OR does the patient have an upcoming appointment? Yes  Can we respond through MyChart? Yes  Agent: Please be advised that Rx refills may take up to 3 business days. We ask that you follow-up with your pharmacy.

## 2023-02-13 NOTE — Telephone Encounter (Signed)
Last Fill: Losartan:      Rosuvastatin:  Last OV: 11/23/22 Next OV: 02/27/23  Routing to provider for review/authorization.

## 2023-02-14 MED ORDER — LOSARTAN POTASSIUM 100 MG PO TABS: 100.0000 mg | ORAL_TABLET | Freq: Every day | ORAL | 1 refills | Status: DC

## 2023-02-14 MED ORDER — ROSUVASTATIN CALCIUM 40 MG PO TABS: 40.0000 mg | ORAL_TABLET | Freq: Every day | ORAL | 1 refills | Status: DC

## 2023-02-17 ENCOUNTER — Encounter: Payer: Self-pay | Admitting: Family Medicine

## 2023-02-20 MED ORDER — ROSUVASTATIN CALCIUM 10 MG PO TABS: 10.0000 mg | ORAL_TABLET | Freq: Every day | ORAL | 1 refills | Status: DC

## 2023-02-27 ENCOUNTER — Encounter: Payer: Self-pay | Admitting: Family Medicine

## 2023-02-27 ENCOUNTER — Ambulatory Visit (INDEPENDENT_AMBULATORY_CARE_PROVIDER_SITE_OTHER): Payer: Medicare HMO | Admitting: Family Medicine

## 2023-02-27 VITALS — BP 120/60 | HR 63 | Temp 98.5°F | Resp 18 | Ht 63.6 in | Wt 281.7 lb

## 2023-02-27 DIAGNOSIS — E1169 Type 2 diabetes mellitus with other specified complication: Secondary | ICD-10-CM | POA: Diagnosis not present

## 2023-02-27 DIAGNOSIS — E1122 Type 2 diabetes mellitus with diabetic chronic kidney disease: Secondary | ICD-10-CM

## 2023-02-27 DIAGNOSIS — E2839 Other primary ovarian failure: Secondary | ICD-10-CM

## 2023-02-27 DIAGNOSIS — E785 Hyperlipidemia, unspecified: Secondary | ICD-10-CM

## 2023-02-27 DIAGNOSIS — M793 Panniculitis, unspecified: Secondary | ICD-10-CM

## 2023-02-27 DIAGNOSIS — I10 Essential (primary) hypertension: Secondary | ICD-10-CM

## 2023-02-27 DIAGNOSIS — I5032 Chronic diastolic (congestive) heart failure: Secondary | ICD-10-CM

## 2023-02-27 DIAGNOSIS — F418 Other specified anxiety disorders: Secondary | ICD-10-CM | POA: Diagnosis not present

## 2023-02-27 DIAGNOSIS — Z794 Long term (current) use of insulin: Secondary | ICD-10-CM | POA: Diagnosis not present

## 2023-02-27 DIAGNOSIS — F419 Anxiety disorder, unspecified: Secondary | ICD-10-CM | POA: Diagnosis not present

## 2023-02-27 DIAGNOSIS — N183 Chronic kidney disease, stage 3 unspecified: Secondary | ICD-10-CM

## 2023-02-27 DIAGNOSIS — N1832 Chronic kidney disease, stage 3b: Secondary | ICD-10-CM

## 2023-02-27 LAB — COMPREHENSIVE METABOLIC PANEL
ALT: 12 U/L (ref 0–35)
AST: 13 U/L (ref 0–37)
Albumin: 4.3 g/dL (ref 3.5–5.2)
Alkaline Phosphatase: 60 U/L (ref 39–117)
BUN: 38 mg/dL — ABNORMAL HIGH (ref 6–23)
CO2: 37 meq/L — ABNORMAL HIGH (ref 19–32)
Calcium: 9.1 mg/dL (ref 8.4–10.5)
Chloride: 98 meq/L (ref 96–112)
Creatinine, Ser: 1.29 mg/dL — ABNORMAL HIGH (ref 0.40–1.20)
GFR: 40.94 mL/min — ABNORMAL LOW (ref >60.00)
Glucose, Bld: 86 mg/dL (ref 70–99)
Potassium: 4.1 meq/L (ref 3.5–5.1)
Sodium: 143 meq/L (ref 135–145)
Total Bilirubin: 0.5 mg/dL (ref 0.2–1.2)
Total Protein: 6.6 g/dL (ref 6.0–8.3)

## 2023-02-27 LAB — LIPID PANEL
Cholesterol: 153 mg/dL (ref 0–200)
HDL: 37.3 mg/dL — ABNORMAL LOW (ref >39.00)
LDL Cholesterol: 94 mg/dL (ref 0–99)
NonHDL: 115.24
Total CHOL/HDL Ratio: 4
Triglycerides: 106 mg/dL (ref 0.0–149.0)
VLDL: 21.2 mg/dL (ref 0.0–40.0)

## 2023-02-27 MED ORDER — SERTRALINE HCL 25 MG PO TABS
25.0000 mg | ORAL_TABLET | Freq: Every day | ORAL | 3 refills | Status: DC
Start: 2023-02-27 — End: 2023-03-20

## 2023-02-27 NOTE — Assessment & Plan Note (Signed)
 Per cardiology

## 2023-02-27 NOTE — Assessment & Plan Note (Signed)
 Well controlled, no changes to meds. Encouraged heart healthy diet such as the DASH diet and exercise as tolerated.

## 2023-02-27 NOTE — Progress Notes (Signed)
 Established Patient Office Visit  Subjective   Patient ID: Morgan Fischer, female    DOB: 1950-02-20  Age: 73 y.o. MRN: [id number]  Chief Complaint  Patient presents with   Hypertension   Diabetes   Follow-up    HPI Discussed the use of AI scribe software for clinical note transcription with the patient, who gave verbal consent to proceed.  History of Present Illness   Morgan Fischer is a 73 year old female who presents with anxiety and abdominal swelling. She is accompanied by her Caretaker   She has been experiencing anxiety symptoms that have worsened over the past month. She feels nervous and experiences shaking, especially in closed spaces like the shower, which prompts her to leave quickly. These episodes occur almost daily and sometimes lead to tears. She has not previously taken any medication for anxiety.  She has a longstanding issue with abdominal swelling, which she attributes to a debridement procedure in 2000. The swelling began around 2003 or 2004 and, while not painful, has become heavy and uncomfortable, affecting her legs. She recalls being informed that it was related to lymph fluid. The swelling has not increased in size recently but has been present for over 20 years.  Her blood sugar levels are well-controlled, with a recent HbA1c of 7.2%. She is currently taking Belsomra, which has improved her sleep, allowing her to sleep through the night and reducing the need for daytime naps.  She is up to date with her mammogram, which was done in December at [facility] on [address]. She has a podiatrist who checks her feet regularly. Her last bone density test was several years ago.       Patient Active Problem List   Diagnosis Date Noted   Need for pneumococcal 20-valent conjugate vaccination 11/23/2022   Need for influenza vaccination 11/23/2022   Insomnia 11/23/2022   Hyperlipidemia associated with type 2 diabetes mellitus (HCC) 11/23/2022   Chronic kidney disease,  stage 3b (HCC) 02/04/2021   Long term (current) use of insulin (HCC) 02/04/2021   Mild intermittent asthma 02/04/2021   Hypertrophic cardiomyopathy (HCC) 04/12/2020   Type 2 diabetes mellitus without complication, without long-term current use of insulin (HCC) 04/12/2020   Anxiety 03/15/2020   Cardiomyopathy (HCC) 03/15/2020   Iron deficiency 03/15/2020   Osteoarthritis of knee 03/15/2020   Peripheral venous insufficiency 03/15/2020   Hypertensive heart disease without congestive heart failure 03/15/2020   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   Hyperlipidemia, unspecified 03/15/2020   Mixed hyperlipidemia 03/15/2020   Type 2 diabetes mellitus without complications (HCC) 03/15/2020   Acute respiratory failure (HCC) 03/08/2020   COPD with acute exacerbation (HCC) 03/08/2020   Acute on chronic respiratory failure with hypoxia and hypercapnia (HCC) 03/08/2020   Acute on chronic diastolic CHF (congestive heart failure) (HCC) 03/08/2020   Obesity hypoventilation syndrome (HCC) 03/08/2020   Arthritis    Diabetes mellitus without complication (HCC)    High cholesterol    Acute diastolic heart failure (HCC) 07/17/2019   Chronic hypoxemic respiratory failure (HCC)    CKD (chronic kidney disease)    Chest pain 07/14/2019   Aortic stenosis 11/01/2018   Heart block AV complete (HCC)    Acute on chronic respiratory failure with hypoxia (HCC) 10/20/2018   HOCM (hypertrophic obstructive cardiomyopathy) (HCC) 09/18/2017   Pure hypercholesterolemia 02/13/2017   CHF (congestive heart failure) (HCC) 12/22/2016   Type 2 diabetes mellitus with stage 3 chronic kidney disease, with long-term current use of insulin (HCC) 11/19/2016   Essential hypertension  11/19/2016   Gout 11/19/2016   Obstructive sleep apnea 11/19/2016   Essential (primary) hypertension 11/19/2016   Acute hypercapnic respiratory failure (HCC) 07/21/2016   Drug allergy 07/21/2016   Morbid obesity with BMI of 60.0-69.9, adult (HCC)  07/21/2016   Acute on chronic congestive heart failure (HCC) 07/19/2016   Acute respiratory acidosis (HCC) 07/19/2016   Acute respiratory distress 07/19/2016   Type 2 diabetes mellitus with hyperglycemia (HCC) 07/19/2016   Leukocytosis 07/19/2016   Chronic diastolic heart failure (HCC) 03/17/2016   Hypertensive heart disease with heart failure (HCC) 03/17/2016   Hypoxia 03/17/2016   Atherosclerosis of native coronary artery of native heart without angina pectoris 03/17/2016   Nonrheumatic aortic valve stenosis 03/17/2016   Troponin level elevated 08/24/2015   SOB (shortness of breath) 08/24/2015   Hypokalemia 08/24/2015   Morbid obesity (HCC)    Hypertension    Asthma    Hypomagnesemia    Panniculitis 08/23/2015   Past Medical History:  Diagnosis Date   Acute diastolic heart failure (HCC) 07/17/2019   Acute hypercapnic respiratory failure (HCC) 07/21/2016   Acute on chronic congestive heart failure (HCC) 07/19/2016   Acute on chronic diastolic CHF (congestive heart failure) (HCC) 03/08/2020   Acute on chronic respiratory failure with hypoxia (HCC) 10/20/2018   Acute on chronic respiratory failure with hypoxia and hypercapnia (HCC) 03/08/2020   Acute respiratory acidosis (HCC) 07/19/2016   Acute respiratory distress 07/19/2016   Acute respiratory failure (HCC) 03/08/2020   Anxiety 03/15/2020   Aortic stenosis 11/01/2018   Arthritis    Asthma    Atherosclerosis of native coronary artery of native heart without angina pectoris 03/17/2016   Cardiomyopathy (HCC) 03/15/2020   Chest pain 07/14/2019   CHF (congestive heart failure) (HCC)    Chronic diastolic heart failure (HCC) 03/17/2016   Chronic hypoxemic respiratory failure (HCC)    Chronic kidney disease, stage 3b (HCC) 02/04/2021   CKD (chronic kidney disease)    COPD (chronic obstructive pulmonary disease) (HCC)    COPD with acute exacerbation (HCC) 03/08/2020   Diabetes mellitus without complication (HCC)    Drug allergy  07/21/2016   Essential (primary) hypertension 11/19/2016   Essential hypertension 11/19/2016   Gout 11/19/2016   Heart block AV complete (HCC)    High cholesterol    HOCM (hypertrophic obstructive cardiomyopathy) (HCC) 09/18/2017   Hyperlipidemia, unspecified 03/15/2020   Hypertension    Hypertensive heart disease with heart failure (HCC) 03/17/2016   Hypertensive heart disease without congestive heart failure 03/15/2020   Hypertrophic cardiomyopathy (HCC) 04/12/2020   Hypokalemia 08/24/2015   Hypomagnesemia    Hypoxia 03/17/2016   Iron deficiency 03/15/2020   Leukocytosis 07/19/2016   Long term (current) use of insulin (HCC) 02/04/2021   Mild intermittent asthma 02/04/2021   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   Mixed hyperlipidemia 03/15/2020   Morbid obesity (HCC)    Morbid obesity with BMI of 60.0-69.9, adult (HCC) 07/21/2016   Nonrheumatic aortic valve stenosis 03/17/2016   Obesity hypoventilation syndrome (HCC) 03/08/2020   Obstructive sleep apnea 11/19/2016   Osteoarthritis of knee 03/15/2020   Panniculitis 08/23/2015   Peripheral venous insufficiency 03/15/2020   Pure hypercholesterolemia 02/13/2017   SOB (shortness of breath) 08/24/2015   Troponin level elevated 08/24/2015   Type 2 diabetes mellitus with hyperglycemia (HCC) 07/19/2016   Type 2 diabetes mellitus without complication, without long-term current use of insulin (HCC) 04/12/2020   Type 2 diabetes mellitus without complications (HCC) 03/15/2020   Type 2 diabetes mellitus, without long-term current use of  insulin (HCC) 11/19/2016   Past Surgical History:  Procedure Laterality Date   ABDOMINAL HYSTERECTOMY     cellulitis debridement  2000   abdomen   CESAREAN SECTION     Social History   Tobacco Use   Smoking status: Never   Smokeless tobacco: Never  Vaping Use   Vaping status: Never Used  Substance Use Topics   Alcohol use: No   Drug use: No   Social History   Socioeconomic History   Marital  status: Widowed    Spouse name: Not on file   Number of children: Not on file   Years of education: Not on file   Highest education level: Bachelor's degree (e.g., BA, AB, BS)  Occupational History   Occupation: retired    Comment: Copywriter, 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  Tobacco Use   Smoking status: Never   Smokeless tobacco: Never  Vaping Use   Vaping status: Never Used  Substance and Sexual Activity   Alcohol use: No   Drug use: No   Sexual activity: Never  Other Topics Concern   Not on file  Social History Narrative   Not on file   Social Drivers of Health   Financial Resource Strain: Patient Declined (02/13/2023)   Overall Financial Resource Strain (CARDIA)    Difficulty of Paying Living Expenses: Patient declined  Food Insecurity: No Food Insecurity (02/13/2023)   Hunger Vital Sign    Worried About Running Out of Food in the Last Year: Never true    Ran Out of Food in the Last Year: Never true  Transportation Needs: No Transportation Needs (02/13/2023)   PRAPARE - Administrator, Civil Service (Medical): No    Lack of Transportation (Non-Medical): No  Physical Activity: Unknown (02/13/2023)   Exercise Vital Sign    Days of Exercise per Week: 0 days    Minutes of Exercise per Session: Not on file  Stress: No Stress Concern Present (02/13/2023)   [employer] of Occupational Health - Occupational Stress Questionnaire    Feeling of Stress : Only a little  Social Connections: Moderately Integrated (02/13/2023)   Social Connection and Isolation Panel [NHANES]    Frequency of Communication with Friends and Family: More than three times a week    Frequency of Social Gatherings with Friends and Family: More than three times a week    Attends Religious Services: More than 4 times per year    Active Member of [employer] or Organizations: Yes    Attends Banker Meetings: Patient declined    Marital Status: Widowed  Intimate Partner Violence:  Not At Risk (02/13/2023)   Humiliation, Afraid, Rape, and Kick questionnaire    Fear of Current or Ex-Partner: No    Emotionally Abused: No    Physically Abused: No    Sexually Abused: No   Family Status  Relation Name Status   Mother  Deceased   Father  Deceased   Sister  Alive   Sister  Alive   Sister  Deceased at age 47   Brother  Alive   Brother  Alive   Brother  Alive   Brother  Alive   Brother  Alive   Brother  Deceased at age 9  No partnership data on file   Family History  Problem Relation Age of Onset   Diabetes Mellitus I Mother    CAD Mother    Diabetes Mother    Heart disease Father    Cancer Father  Breast cancer Sister    Cancer Sister    Heart disease Sister    Prostate cancer Brother    Allergies  Allergen Reactions   Atorvastatin Anaphylaxis and Swelling    Lip swelling    Ciprofloxacin Anaphylaxis    Kidney failure   Ciprocinonide [Fluocinolone] Other (See Comments)    Kidney failure   Catapres [Clonidine Hcl] Rash   Clonidine Rash   Demadex [Torsemide] Rash      Review of Systems  Constitutional:  Negative for chills, fever and malaise/fatigue.  HENT:  Negative for congestion and hearing loss.   Eyes:  Negative for blurred vision and discharge.  Respiratory:  Negative for cough, sputum production and shortness of breath.   Cardiovascular:  Negative for chest pain, palpitations and leg swelling.  Gastrointestinal:  Negative for abdominal pain, blood in stool, constipation, diarrhea, heartburn, nausea and vomiting.  Genitourinary:  Negative for dysuria, frequency, hematuria and urgency.  Musculoskeletal:  Negative for back pain, falls and myalgias.  Skin:  Negative for rash.  Neurological:  Negative for dizziness, sensory change, loss of consciousness, weakness and headaches.  Endo/Heme/Allergies:  Negative for environmental allergies. Does not bruise/bleed easily.  Psychiatric/Behavioral:  Negative for depression and suicidal ideas. The  patient is not nervous/anxious and does not have insomnia.       Objective:     BP 120/60 (BP Location: Left Arm, Patient Position: Sitting, Cuff Size: Large)   Pulse 63   Temp 98.5 F (36.9 C) (Oral)   Resp 18   Ht 5' 3.6" (1.615 m)   Wt 281 lb 11.2 oz (127.8 kg)   LMP  (LMP Unknown)   SpO2 98%   BMI 48.96 kg/m  BP Readings from Last 3 Encounters:  02/27/23 120/60  11/23/22 (!) 150/80  09/28/22 126/60   Wt Readings from Last 3 Encounters:  02/27/23 281 lb 11.2 oz (127.8 kg)  02/13/23 283 lb (128.4 kg)  11/23/22 283 lb 6.4 oz (128.5 kg)   SpO2 Readings from Last 3 Encounters:  02/27/23 98%  11/23/22 98%  09/28/22 95%      Physical Exam Vitals and nursing note reviewed.      No results found for any visits on 02/27/23.  Last CBC Lab Results  Component Value Date   WBC 7.7 11/23/2022   HGB 11.3 (L) 11/23/2022   HCT 36.4 11/23/2022   MCV 89.9 11/23/2022   MCH 26.9 03/12/2020   RDW 17.4 (H) 11/23/2022   PLT 148.0 (L) 11/23/2022   Last metabolic panel Lab Results  Component Value Date   GLUCOSE 99 11/23/2022   NA 141 11/23/2022   K 4.7 11/23/2022   CL 101 11/23/2022   CO2 33 (H) 11/23/2022   BUN 56 (H) 11/23/2022   CREATININE 1.44 (H) 11/23/2022   GFR 35.95 (L) 11/23/2022   CALCIUM 9.0 11/23/2022   PHOS 3.1 03/10/2020   PROT 6.6 11/23/2022   ALBUMIN 4.5 11/23/2022   LABGLOB 2.4 09/28/2022   BILITOT 0.5 11/23/2022   ALKPHOS 53 11/23/2022   AST 13 11/23/2022   ALT 14 11/23/2022   ANIONGAP 12 03/12/2020   Last lipids Lab Results  Component Value Date   CHOL 148 11/23/2022   HDL 33.00 (L) 11/23/2022   LDLCALC 89 11/23/2022   TRIG 133.0 11/23/2022   CHOLHDL 4 11/23/2022   Last hemoglobin A1c Lab Results  Component Value Date   HGBA1C 8.1 (H) 11/23/2022   Last thyroid functions Lab Results  Component Value Date   TSH  0.662 03/09/2020   Last vitamin D No results found for: "25OHVITD2", "25OHVITD3", "VD25OH" Last vitamin B12 and  Folate Lab Results  Component Value Date   VITAMINB12 1,292 (H) 03/08/2020   FOLATE 36.5 03/08/2020      The 10-year ASCVD risk score (Arnett DK, et al., 2019) is: 19.4%    Assessment & Plan:   Problem List Items Addressed This Visit       Unprioritized   Hypertension   Relevant Orders   Comprehensive metabolic panel   Lipid panel   Type 2 diabetes mellitus with stage 3 chronic kidney disease, with long-term current use of insulin (HCC) - Primary   Per endo and neph      Relevant Orders   Comprehensive metabolic panel   Panniculitis   Large mass -- soft , fluid filled Ct done several years ago -- pt states it has enlarged over the years and is bothering her leg       Relevant Orders   Ambulatory referral to General Surgery   Hyperlipidemia associated with type 2 diabetes mellitus (HCC)   Tolerating statin, encouraged heart healthy diet, avoid trans fats, minimize simple carbs and saturated fats. Increase exercise as tolerated       Relevant Orders   Comprehensive metabolic panel   Lipid panel   Essential (primary) hypertension   Well controlled, no changes to meds. Encouraged heart healthy diet such as the DASH diet and exercise as tolerated.        Chronic kidney disease, stage 3b [facility])   Per nephrology      Chronic diastolic heart failure [facility])   Per cardiology      Anxiety   Zoloft 25 mg daily  F/u 1 month or sooner as needed       Relevant Medications   sertraline (ZOLOFT) 25 MG tablet   Other Visit Diagnoses       Depression with anxiety       Relevant Medications   sertraline (ZOLOFT) 25 MG tablet     Estrogen deficiency       Relevant Orders   DG Bone Density     Assessment and Plan    Anxiety Worsening anxiety, particularly in the mornings, presents with nervousness and shaking. No prior treatment. Zoloft (sertraline) is recommended as a safe option, considering age and respiratory status. It may take 2 weeks to 2 months to notice  improvement and is non-addictive. Benzodiazepines' risks on respiratory drive were discussed. Zoloft is compatible with current medication, Belsomra. Prescribe Zoloft, 30 pills. Follow up in 3-4 weeks to assess response and consider dose adjustment.  Abdominal Lymphedema Post-abdominal debridement in 2000 led to a non-painful, heavy abdominal growth, increasing over 20 years. Previous surgical advice was against surgery due to age and health. Current discomfort from weight on legs. No recent imaging or specialist consultation. Discussed potential need for CT scan or ultrasound. Refer to a surgeon in [place] for evaluation. Anticipate need for CT scan or ultrasound as per surgeon's discretion. Request records from [facility] for previous surgical consultations.  Diabetes Mellitus Good control of blood sugars with a recent HbA1c of 7.2%. Continue current diabetes management and monitor blood sugar levels regularly.  General Health Maintenance Recent visits to endocrinologist and podiatrist, with an upcoming nephrologist appointment. Mammogram in August, no bone density scan in several years. Check cholesterol and kidney function today. Schedule bone density scan. Continue regular follow-ups with endocrinologist, nephrologist, and podiatrist.  Follow-up Follow up in 3-4 weeks for  anxiety management. Follow up with surgeon in [facility] for abdominal lymphedema. Continue regular follow-ups with endocrinologist, nephrologist, and podiatrist.        No follow-ups on file.    Donato Schultz, DO

## 2023-02-27 NOTE — Assessment & Plan Note (Signed)
 Large mass -- soft , fluid filled Ct done several years ago -- pt states it has enlarged over the years and is bothering her leg

## 2023-02-27 NOTE — Assessment & Plan Note (Signed)
 Per nephrology

## 2023-02-27 NOTE — Patient Instructions (Signed)
 Lymphedema  Lymphedema is swelling that happens when an abnormal amount of lymph collects in the soft tissues under your skin. Lymph is fluid that moves through your lymphatic system. This system: Is part of your body's defense system, also called your immune system. Filters germs and waste from tissues in your body to your bloodstream. Lymphedema happens when your lymphatic system is blocked. This keeps lymph from draining as it should and leads to swelling. What are the causes? The cause of lymphedema depends on which type you have. Primary lymphedema is when you're born without lymph vessels or with lymph vessels that aren't normal. Secondary lymphedema is more common. It happens when lymph vessels are blocked or damaged from: Infection. Injury. Radiation therapy. Cancer. Scar tissue that forms. Surgery. What are the signs or symptoms? A swollen arm, leg, feet, toes, or fingers. A heavy or tight feeling in the swollen area. Skin that turns red near the swollen area. Not being able to move your arm or leg. Your arm or leg is sensitive to touch. Discomfort in your arm or leg. How is this diagnosed? Lymphedema may be diagnosed based on: Your symptoms and medical history. A physical exam. Bioimpedance spectroscopy. This test uses painless electrical currents. They help measure fluid levels in your body. Imaging tests, such as: MRI or CT scan. Duplex ultrasound. This test uses sound waves to make pictures on a screen. The pictures show your blood vessels and blood flow. Lymphoscintigraphy. In this test, a low dose of radioactive substance is given through a needle that goes through your skin. The substance traces the flow of lymph through your lymph vessels. Lymphangiography. In this test, a contrast dye is put into your lymph vessel. The dye helps show if the vessel is blocked. How is this treated?  If another condition is causing your lymphedema, that condition will be treated.  For example, antibiotics may be used to treat infection. Treatment for lymphedema depends on the cause. Treatment may include: Complete decongestive therapy (CDT). This lowers fluid buildup. CDT includes: Pressure (compression) wrapping of the area. Manual lymph drainage. This helps lymph drain out of your arm or leg. Certain exercises. These help fluid move out of your arm or leg. Compression. This puts pressure on your arm or leg to lower swelling. It includes: Compression stockings or sleeves. Special bandage wraps. Surgery. This is normally done only for severe cases that don't get better with other treatments. Follow these instructions at home: Self-care Your swollen area is more likely to get hurt or infected. To help prevent infection: Keep the area clean and dry. Use creams or lotions that your health care provider says are okay. These keep your skin moist. Protect your skin from cuts. Use gloves when you cook or garden. Do not walk barefoot. If you shave the area, use an Neurosurgeon. Do not wear tight clothes, shoes, or jewelry. Eat a healthy diet. Eat a lot of fruits and vegetables. Activity Do exercises as told by your provider. Do not sit with your legs crossed. When you can, keep the swollen leg or foot raised above the level of your heart. Avoid using an arm with lymphedema to carry things. General instructions Wear compression stockings or sleeves as told by your provider. Note any changes in size of the swollen arm or leg. You may be told to measure it at set times and track the results. Take over-the-counter and prescription medicines only as told by your provider. If you were prescribed antibiotics, use them  as told by your provider. Do not stop using the antibiotic even if you start to feel better or if your condition improves. Do not use heating pads or ice packs on the swollen area. Avoid having your swollen arm or leg used for: Blood draws. IVs. Blood  pressure checks. Contact a health care provider if: You get new swelling in your arm or leg all of a sudden. Fluid leaks from the skin of your swollen arm or leg. You have a cut that doesn't heal. The swollen area hurts or turns red. You get purple spots, a rash, blisters, or sores on your swollen arm or leg. You have a fever or chills. This information is not intended to replace advice given to you by your health care provider. Make sure you discuss any questions you have with your health care provider. Document Revised: 03/15/2022 Document Reviewed: 03/15/2022 Elsevier Patient Education  2024 [employer].

## 2023-02-27 NOTE — Assessment & Plan Note (Signed)
 Per endo and neph

## 2023-02-27 NOTE — Assessment & Plan Note (Signed)
 Zoloft 25 mg daily  F/u 1 month or sooner as needed

## 2023-02-27 NOTE — Assessment & Plan Note (Signed)
 Tolerating statin, encouraged heart healthy diet, avoid trans fats, minimize simple carbs and saturated fats. Increase exercise as tolerated

## 2023-02-28 ENCOUNTER — Encounter: Payer: Self-pay | Admitting: Family Medicine

## 2023-03-08 DIAGNOSIS — G4733 Obstructive sleep apnea (adult) (pediatric): Secondary | ICD-10-CM | POA: Diagnosis not present

## 2023-03-08 DIAGNOSIS — J449 Chronic obstructive pulmonary disease, unspecified: Secondary | ICD-10-CM | POA: Diagnosis not present

## 2023-03-08 DIAGNOSIS — J45909 Unspecified asthma, uncomplicated: Secondary | ICD-10-CM | POA: Diagnosis not present

## 2023-03-08 DIAGNOSIS — R269 Unspecified abnormalities of gait and mobility: Secondary | ICD-10-CM | POA: Diagnosis not present

## 2023-03-08 DIAGNOSIS — M356 Relapsing panniculitis [Weber-Christian]: Secondary | ICD-10-CM | POA: Diagnosis not present

## 2023-03-14 DIAGNOSIS — J449 Chronic obstructive pulmonary disease, unspecified: Secondary | ICD-10-CM | POA: Diagnosis not present

## 2023-03-14 DIAGNOSIS — J45909 Unspecified asthma, uncomplicated: Secondary | ICD-10-CM | POA: Diagnosis not present

## 2023-03-14 DIAGNOSIS — G4733 Obstructive sleep apnea (adult) (pediatric): Secondary | ICD-10-CM | POA: Diagnosis not present

## 2023-03-14 DIAGNOSIS — M356 Relapsing panniculitis [Weber-Christian]: Secondary | ICD-10-CM | POA: Diagnosis not present

## 2023-03-14 DIAGNOSIS — R269 Unspecified abnormalities of gait and mobility: Secondary | ICD-10-CM | POA: Diagnosis not present

## 2023-03-19 DIAGNOSIS — Z978 Presence of other specified devices: Secondary | ICD-10-CM | POA: Diagnosis not present

## 2023-03-19 DIAGNOSIS — Z794 Long term (current) use of insulin: Secondary | ICD-10-CM | POA: Diagnosis not present

## 2023-03-19 DIAGNOSIS — Z7984 Long term (current) use of oral hypoglycemic drugs: Secondary | ICD-10-CM | POA: Diagnosis not present

## 2023-03-19 DIAGNOSIS — E119 Type 2 diabetes mellitus without complications: Secondary | ICD-10-CM | POA: Diagnosis not present

## 2023-03-20 ENCOUNTER — Ambulatory Visit: Payer: Self-pay | Admitting: Family Medicine

## 2023-03-20 MED ORDER — SERTRALINE HCL 50 MG PO TABS: 50.0000 mg | ORAL_TABLET | Freq: Every day | ORAL | 3 refills | Status: DC

## 2023-03-20 NOTE — Addendum Note (Signed)
 Addended by: Maximino Sarin on: 03/20/2023 03:18 PM   Modules accepted: Orders

## 2023-03-20 NOTE — Telephone Encounter (Addendum)
 3rd attempt, "call cannot be completed as dialed" message. Routing to clinic for follow up.  2nd attempt, "call cannot be completed as dialed" message  1st attempt, "call cannot be completed as dialed" message.  Copied from CRM [id number]. Topic: Clinical - Medication Question >> Mar 20, 2023 11:03 AM Gurney Maxin H wrote: Reason for CRM: Patient states medication sertraline (ZOLOFT) 25 MG tablet not working having more incidents and more intense, wants to know if she should increase dosage or she needs something different, patient didn't want to go in details with agent about issue.   Raechel  [phone]

## 2023-03-20 NOTE — Telephone Encounter (Signed)
 Pt notified & advised to keep appt for 04/02/23

## 2023-03-21 ENCOUNTER — Other Ambulatory Visit: Payer: Self-pay | Admitting: Family Medicine

## 2023-03-21 DIAGNOSIS — M79671 Pain in right foot: Secondary | ICD-10-CM | POA: Diagnosis not present

## 2023-03-21 DIAGNOSIS — L84 Corns and callosities: Secondary | ICD-10-CM | POA: Diagnosis not present

## 2023-03-21 DIAGNOSIS — E1159 Type 2 diabetes mellitus with other circulatory complications: Secondary | ICD-10-CM | POA: Diagnosis not present

## 2023-03-21 DIAGNOSIS — M79672 Pain in left foot: Secondary | ICD-10-CM | POA: Diagnosis not present

## 2023-03-21 DIAGNOSIS — B351 Tinea unguium: Secondary | ICD-10-CM | POA: Diagnosis not present

## 2023-03-21 NOTE — Telephone Encounter (Signed)
 Zoloft refilled yesterday 03/20/23. Refills available at pharmacy.

## 2023-03-21 NOTE — Telephone Encounter (Signed)
 Copied from CRM [id number]. Topic: Clinical - Medication Refill >> Mar 21, 2023 11:29 AM Pascal Lux wrote: Most Recent Primary Care Visit:  Provider: Seabron Spates R  Department: [facility]  Visit Type: OFFICE VISIT  Date: 02/27/2023  Medication: sertraline (ZOLOFT) 50 MG tablet [[id number]]  Has the patient contacted their pharmacy? Yes (Agent: If no, request that the patient contact the pharmacy for the refill. If patient does not wish to contact the pharmacy document the reason why and proceed with request.) (Agent: If yes, when and what did the pharmacy advise?) awaiting prescription  Is this the correct pharmacy for this prescription? Yes If no, delete pharmacy and type the correct one.  This is the patient's preferred pharmacy:  [id number], [place] - [address] AT [facility] [address] [place] [address] Phone: [phone] Fax: [phone]   Has the prescription been filled recently? No  Is the patient out of the medication? Yes, 1 pill left  Has the patient been seen for an appointment in the last year OR does the patient have an upcoming appointment? Yes  Can we respond through MyChart? Yes  Agent: Please be advised that Rx refills may take up to 3 business days. We ask that you follow-up with your pharmacy.

## 2023-03-29 DIAGNOSIS — J449 Chronic obstructive pulmonary disease, unspecified: Secondary | ICD-10-CM | POA: Diagnosis not present

## 2023-03-29 DIAGNOSIS — G4733 Obstructive sleep apnea (adult) (pediatric): Secondary | ICD-10-CM | POA: Diagnosis not present

## 2023-03-29 DIAGNOSIS — I10 Essential (primary) hypertension: Secondary | ICD-10-CM | POA: Diagnosis not present

## 2023-04-02 ENCOUNTER — Encounter: Payer: Self-pay | Admitting: Family Medicine

## 2023-04-02 ENCOUNTER — Telehealth (INDEPENDENT_AMBULATORY_CARE_PROVIDER_SITE_OTHER): Payer: Medicare HMO | Admitting: Family Medicine

## 2023-04-02 DIAGNOSIS — F418 Other specified anxiety disorders: Secondary | ICD-10-CM

## 2023-04-02 MED ORDER — SERTRALINE HCL 100 MG PO TABS: 100.0000 mg | ORAL_TABLET | Freq: Every day | ORAL | 3 refills | Status: DC

## 2023-04-02 NOTE — Progress Notes (Signed)
 MyChart Video Visit    Virtual Visit via Video Note   This patient is at least at moderate risk for complications without adequate follow up. This format is felt to be most appropriate for this patient at this time. Physical exam was limited by quality of the video and audio technology used for the visit. Herbert Seta was able to get the patient set up on a video visit.  Patient location: home alone ,  Patient and provider in visit Provider location: Office  I discussed the limitations of evaluation and management by telemedicine and the availability of in person appointments. The patient expressed understanding and agreed to proceed.  Visit Date: 04/02/2023  Today's healthcare provider: Donato Schultz, DO     Subjective:    Patient ID: Morgan Fischer, female    DOB: 1950/07/09, 73 y.o.   MRN: [id number]  No chief complaint on file.   HPI Patient is in today for f/u depression / anxiety .   Discussed the use of AI scribe software for clinical note transcription with the patient, who gave verbal consent to proceed.  History of Present Illness Morgan Fischer is a 73 year old female who presents with persistent panic attacks despite medication.  She experiences ongoing panic attacks despite taking sertraline 50 mg daily. The medication provides some relief but is insufficient, as she continues to feel anxious, particularly in the late afternoon. She describes anxiety in various situations, a need to leave areas quickly, and occasional feelings of wanting to cry without actually crying. She also reports a lack of energy and motivation, which is unusual for her.  She is currently taking two 25 mg tablets of sertraline daily because the 50 mg prescription was not called into the pharmacy. She is concerned about increasing the dose due to her heart condition but acknowledges that sertraline is considered safe for her age group.  She takes Belsomra for sleep, which is generally effective,  although there are nights when she cannot sleep despite taking the medication. She wonders if this could be related to her anxiety.  She lives alone most of the time, with occasional visits from her daughter. She has not previously considered counseling but is open to it if it might help with her anxiety. '  Past Medical History:  Diagnosis Date   Acute diastolic heart failure (HCC) 07/17/2019   Acute hypercapnic respiratory failure (HCC) 07/21/2016   Acute on chronic congestive heart failure (HCC) 07/19/2016   Acute on chronic diastolic CHF (congestive heart failure) (HCC) 03/08/2020   Acute on chronic respiratory failure with hypoxia (HCC) 10/20/2018   Acute on chronic respiratory failure with hypoxia and hypercapnia (HCC) 03/08/2020   Acute respiratory acidosis (HCC) 07/19/2016   Acute respiratory distress 07/19/2016   Acute respiratory failure (HCC) 03/08/2020   Anxiety 03/15/2020   Aortic stenosis 11/01/2018   Arthritis    Asthma    Atherosclerosis of native coronary artery of native heart without angina pectoris 03/17/2016   Cardiomyopathy (HCC) 03/15/2020   Chest pain 07/14/2019   CHF (congestive heart failure) (HCC)    Chronic diastolic heart failure (HCC) 03/17/2016   Chronic hypoxemic respiratory failure (HCC)    Chronic kidney disease, stage 3b (HCC) 02/04/2021   CKD (chronic kidney disease)    COPD (chronic obstructive pulmonary disease) (HCC)    COPD with acute exacerbation (HCC) 03/08/2020   Diabetes mellitus without complication (HCC)    Drug allergy 07/21/2016   Essential (primary) hypertension 11/19/2016   Essential  hypertension 11/19/2016   Gout 11/19/2016   Heart block AV complete (HCC)    High cholesterol    HOCM (hypertrophic obstructive cardiomyopathy) (HCC) 09/18/2017   Hyperlipidemia, unspecified 03/15/2020   Hypertension    Hypertensive heart disease with heart failure (HCC) 03/17/2016   Hypertensive heart disease without congestive heart failure  03/15/2020   Hypertrophic cardiomyopathy (HCC) 04/12/2020   Hypokalemia 08/24/2015   Hypomagnesemia    Hypoxia 03/17/2016   Iron deficiency 03/15/2020   Leukocytosis 07/19/2016   Long term (current) use of insulin (HCC) 02/04/2021   Mild intermittent asthma 02/04/2021   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   Mixed hyperlipidemia 03/15/2020   Morbid obesity (HCC)    Morbid obesity with BMI of 60.0-69.9, adult (HCC) 07/21/2016   Nonrheumatic aortic valve stenosis 03/17/2016   Obesity hypoventilation syndrome (HCC) 03/08/2020   Obstructive sleep apnea 11/19/2016   Osteoarthritis of knee 03/15/2020   Panniculitis 08/23/2015   Peripheral venous insufficiency 03/15/2020   Pure hypercholesterolemia 02/13/2017   SOB (shortness of breath) 08/24/2015   Troponin level elevated 08/24/2015   Type 2 diabetes mellitus with hyperglycemia (HCC) 07/19/2016   Type 2 diabetes mellitus without complication, without long-term current use of insulin (HCC) 04/12/2020   Type 2 diabetes mellitus without complications (HCC) 03/15/2020   Type 2 diabetes mellitus, without long-term current use of insulin (HCC) 11/19/2016    Past Surgical History:  Procedure Laterality Date   ABDOMINAL HYSTERECTOMY     cellulitis debridement  2000   abdomen   CESAREAN SECTION      Family History  Problem Relation Age of Onset   Diabetes Mellitus I Mother    CAD Mother    Diabetes Mother    Heart disease Father    Cancer Father    Breast cancer Sister    Cancer Sister    Heart disease Sister    Prostate cancer Brother     Social History   Socioeconomic History   Marital status: Widowed    Spouse name: Not on file   Number of children: Not on file   Years of education: Not on file   Highest education level: Bachelor's degree (e.g., BA, AB, BS)  Occupational History   Occupation: retired    Comment: Copywriter, 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  Tobacco Use   Smoking status: Never   Smokeless tobacco:  Never  Vaping Use   Vaping status: Never Used  Substance and Sexual Activity   Alcohol use: No   Drug use: No   Sexual activity: Never  Other Topics Concern   Not on file  Social History Narrative   Not on file   Social Drivers of Health   Financial Resource Strain: Patient Declined (02/13/2023)   Overall Financial Resource Strain (CARDIA)    Difficulty of Paying Living Expenses: Patient declined  Food Insecurity: No Food Insecurity (02/13/2023)   Hunger Vital Sign    Worried About Running Out of Food in the Last Year: Never true    Ran Out of Food in the Last Year: Never true  Transportation Needs: No Transportation Needs (02/13/2023)   PRAPARE - Administrator, Civil Service (Medical): No    Lack of Transportation (Non-Medical): No  Physical Activity: Unknown (02/13/2023)   Exercise Vital Sign    Days of Exercise per Week: 0 days    Minutes of Exercise per Session: Not on file  Stress: No Stress Concern Present (02/13/2023)   [employer] of Occupational Health - Occupational Stress  Questionnaire    Feeling of Stress : Only a little  Social Connections: Moderately Integrated (02/13/2023)   Social Connection and Isolation Panel [NHANES]    Frequency of Communication with Friends and Family: More than three times a week    Frequency of Social Gatherings with Friends and Family: More than three times a week    Attends Religious Services: More than 4 times per year    Active Member of [employer] or Organizations: Yes    Attends Banker Meetings: Patient declined    Marital Status: Widowed  Intimate Partner Violence: Not At Risk (02/13/2023)   Humiliation, Afraid, Rape, and Kick questionnaire    Fear of Current or Ex-Partner: No    Emotionally Abused: No    Physically Abused: No    Sexually Abused: No    Outpatient Medications Prior to Visit  Medication Sig Dispense Refill   allopurinol (ZYLOPRIM) 300 MG tablet Take 300 mg by mouth daily. (Patient  taking differently: Take 100 mg by mouth daily.)     aspirin EC 81 MG EC tablet Take 1 tablet (81 mg total) by mouth daily. 120 tablet 0   Cholecalciferol 50 MCG (2000 UT) CAPS Take 2,000 Units by mouth daily.     Cyanocobalamin (VITAMIN B-12 PO) Take 1 tablet by mouth daily.     dapagliflozin propanediol (FARXIGA) 10 MG TABS tablet Take 10 mg by mouth daily.     diclofenac Sodium (VOLTAREN) 1 % GEL Apply 1 application topically as needed for pain (pain).     disopyramide (NORPACE CR) 100 MG 12 hr capsule Take 200 mg by mouth every 12 (twelve) hours. (Patient taking differently: Take 100 mg by mouth 3 (three) times daily.)     ferrous sulfate 325 (65 FE) MG EC tablet Take 1 tablet by mouth daily.     fluticasone furoate-vilanterol (BREO ELLIPTA) 100-25 MCG/ACT AEPB Inhale 1 puff into the lungs daily.     Garlic 1000 MG CAPS Take 1,000 mg by mouth daily.     hydrochlorothiazide (MICROZIDE) 12.5 MG capsule Take 12.5 mg by mouth daily. (Patient taking differently: Take 25 mg by mouth daily.)     ipratropium-albuterol (DUONEB) 0.5-2.5 (3) MG/3ML SOLN Inhale 3 mLs into the lungs every 4 (four) hours as needed for wheezing (wheezing).     LANTUS SOLOSTAR 100 UNIT/ML Solostar Pen Inject 15 Units into the skin at bedtime. (Patient taking differently: Inject 26 Units into the skin at bedtime.)     losartan (COZAAR) 100 MG tablet Take 1 tablet (100 mg total) by mouth daily. 90 tablet 1   metoprolol succinate (TOPROL-XL) 50 MG 24 hr tablet Take 1 tablet (50 mg total) by mouth daily. 90 tablet 3   montelukast (SINGULAIR) 10 MG tablet Take 1 tablet (10 mg total) by mouth at bedtime. 90 tablet 1   Multiple Vitamin (MULTIVITAMIN WITH MINERALS) TABS tablet Take 1 tablet by mouth daily.     Omega-3 Fatty Acids (FISH OIL) 1000 MG CAPS Take 1,000 mg by mouth 2 (two) times daily.      OXYGEN Inhale 2 L into the lungs continuous.     polyethylene glycol (MIRALAX / GLYCOLAX) 17 g packet Take 17 g by mouth as needed for  mild constipation.     Probiotic Product (PROBIOTIC PO) Take 1 tablet by mouth daily in the afternoon.     rosuvastatin (CRESTOR) 10 MG tablet Take 1 tablet (10 mg total) by mouth daily. 90 tablet 1   Suvorexant (BELSOMRA) 20  MG TABS Take 1 tablet (20 mg total) by mouth at bedtime as needed. 30 tablet 3   Turmeric 500 MG CAPS Take 1 capsule by mouth daily.     VENTOLIN HFA 108 (90 Base) MCG/ACT inhaler Inhale 2 puffs into the lungs every 6 (six) hours as needed for wheezing or shortness of breath.      sertraline (ZOLOFT) 50 MG tablet Take 1 tablet (50 mg total) by mouth daily. 30 tablet 3   No facility-administered medications prior to visit.    Allergies  Allergen Reactions   Atorvastatin Anaphylaxis and Swelling    Lip swelling    Ciprofloxacin Anaphylaxis    Kidney failure   Ciprocinonide [Fluocinolone] Other (See Comments)    Kidney failure   Catapres [Clonidine Hcl] Rash   Clonidine Rash   Demadex [Torsemide] Rash    Review of Systems  Constitutional:  Negative for chills, fever and malaise/fatigue.  HENT:  Negative for congestion and hearing loss.   Eyes:  Negative for blurred vision and discharge.  Respiratory:  Negative for cough, sputum production and shortness of breath.   Cardiovascular:  Negative for chest pain, palpitations and leg swelling.  Gastrointestinal:  Negative for abdominal pain, blood in stool, constipation, diarrhea, heartburn, nausea and vomiting.  Genitourinary:  Negative for dysuria, frequency, hematuria and urgency.  Musculoskeletal:  Negative for back pain, falls and myalgias.  Skin:  Negative for rash.  Neurological:  Negative for dizziness, sensory change, loss of consciousness, weakness and headaches.  Endo/Heme/Allergies:  Negative for environmental allergies. Does not bruise/bleed easily.  Psychiatric/Behavioral:  Negative for depression and suicidal ideas. The patient is not nervous/anxious and does not have insomnia.        Objective:      Depression with anxiety -     Sertraline HCl; Take 1 tablet (100 mg total) by mouth daily.  Dispense: 30 tablet; Refill: 3 -     Ambulatory referral to Behavioral Health   Assessment and Plan Assessment & Plan Generalized Anxiety Disorder   She experiences persistent anxiety and panic attacks, especially in the late afternoon and evening. Anxiety occurs in various situations, including when alone or with her daughter, accompanied by a lack of energy and a desire to cry. The current sertraline dose of 50 mg provides some relief but is insufficient. Increasing the dose to 100 mg is considered, as it is safe for patients over 65 and can be increased up to 200 mg if needed. She is open to this adjustment. Anxiety may also affect her sleep, as she sometimes has difficulty sleeping even after taking Belsomra. Increase sertraline to 100 mg daily, refer to a counselor for therapy, and monitor her response to the increased dose in 4-6 weeks.  Insomnia   She takes Belsomra for insomnia, which is generally effective, but occasionally she cannot sleep despite the medication. Anxiety may contribute to these insomnia episodes. Monitor sleep patterns in conjunction with anxiety treatment.    I discussed the assessment and treatment plan with the patient. The patient was provided an opportunity to ask questions and all were answered. The patient agreed with the plan and demonstrated an understanding of the instructions.   The patient was advised to call back or seek an in-person evaluation if the symptoms worsen or if the condition fails to improve as anticipated.  Donato Schultz, DO [facility] [phone] (phone) [phone] (fax)  [facility]

## 2023-04-03 ENCOUNTER — Ambulatory Visit: Payer: Self-pay

## 2023-04-03 DIAGNOSIS — M356 Relapsing panniculitis [Weber-Christian]: Secondary | ICD-10-CM | POA: Diagnosis not present

## 2023-04-03 DIAGNOSIS — N189 Chronic kidney disease, unspecified: Secondary | ICD-10-CM | POA: Diagnosis not present

## 2023-04-03 DIAGNOSIS — J9622 Acute and chronic respiratory failure with hypercapnia: Secondary | ICD-10-CM | POA: Diagnosis not present

## 2023-04-03 DIAGNOSIS — I110 Hypertensive heart disease with heart failure: Secondary | ICD-10-CM | POA: Diagnosis not present

## 2023-04-03 DIAGNOSIS — I5023 Acute on chronic systolic (congestive) heart failure: Secondary | ICD-10-CM | POA: Diagnosis not present

## 2023-04-03 DIAGNOSIS — I10 Essential (primary) hypertension: Secondary | ICD-10-CM | POA: Diagnosis not present

## 2023-04-03 DIAGNOSIS — I422 Other hypertrophic cardiomyopathy: Secondary | ICD-10-CM | POA: Diagnosis not present

## 2023-04-03 DIAGNOSIS — I080 Rheumatic disorders of both mitral and aortic valves: Secondary | ICD-10-CM | POA: Diagnosis not present

## 2023-04-03 DIAGNOSIS — R0603 Acute respiratory distress: Secondary | ICD-10-CM | POA: Diagnosis not present

## 2023-04-03 DIAGNOSIS — E1151 Type 2 diabetes mellitus with diabetic peripheral angiopathy without gangrene: Secondary | ICD-10-CM | POA: Diagnosis not present

## 2023-04-03 DIAGNOSIS — E872 Acidosis, unspecified: Secondary | ICD-10-CM | POA: Diagnosis not present

## 2023-04-03 DIAGNOSIS — J9621 Acute and chronic respiratory failure with hypoxia: Secondary | ICD-10-CM | POA: Diagnosis not present

## 2023-04-03 DIAGNOSIS — D509 Iron deficiency anemia, unspecified: Secondary | ICD-10-CM | POA: Diagnosis not present

## 2023-04-03 DIAGNOSIS — J449 Chronic obstructive pulmonary disease, unspecified: Secondary | ICD-10-CM | POA: Diagnosis not present

## 2023-04-03 DIAGNOSIS — M6281 Muscle weakness (generalized): Secondary | ICD-10-CM | POA: Diagnosis not present

## 2023-04-03 DIAGNOSIS — R269 Unspecified abnormalities of gait and mobility: Secondary | ICD-10-CM | POA: Diagnosis not present

## 2023-04-03 DIAGNOSIS — J90 Pleural effusion, not elsewhere classified: Secondary | ICD-10-CM | POA: Diagnosis not present

## 2023-04-03 DIAGNOSIS — M109 Gout, unspecified: Secondary | ICD-10-CM | POA: Diagnosis not present

## 2023-04-03 DIAGNOSIS — J45909 Unspecified asthma, uncomplicated: Secondary | ICD-10-CM | POA: Diagnosis not present

## 2023-04-03 DIAGNOSIS — E1122 Type 2 diabetes mellitus with diabetic chronic kidney disease: Secondary | ICD-10-CM | POA: Diagnosis not present

## 2023-04-03 DIAGNOSIS — R0602 Shortness of breath: Secondary | ICD-10-CM | POA: Diagnosis not present

## 2023-04-03 DIAGNOSIS — E119 Type 2 diabetes mellitus without complications: Secondary | ICD-10-CM | POA: Diagnosis not present

## 2023-04-03 DIAGNOSIS — I350 Nonrheumatic aortic (valve) stenosis: Secondary | ICD-10-CM | POA: Diagnosis not present

## 2023-04-03 DIAGNOSIS — I2510 Atherosclerotic heart disease of native coronary artery without angina pectoris: Secondary | ICD-10-CM | POA: Diagnosis not present

## 2023-04-03 DIAGNOSIS — E785 Hyperlipidemia, unspecified: Secondary | ICD-10-CM | POA: Diagnosis not present

## 2023-04-03 DIAGNOSIS — I421 Obstructive hypertrophic cardiomyopathy: Secondary | ICD-10-CM | POA: Diagnosis not present

## 2023-04-03 DIAGNOSIS — J9690 Respiratory failure, unspecified, unspecified whether with hypoxia or hypercapnia: Secondary | ICD-10-CM | POA: Diagnosis not present

## 2023-04-03 DIAGNOSIS — I129 Hypertensive chronic kidney disease with stage 1 through stage 4 chronic kidney disease, or unspecified chronic kidney disease: Secondary | ICD-10-CM | POA: Diagnosis not present

## 2023-04-03 DIAGNOSIS — R531 Weakness: Secondary | ICD-10-CM | POA: Diagnosis not present

## 2023-04-03 DIAGNOSIS — I509 Heart failure, unspecified: Secondary | ICD-10-CM | POA: Diagnosis not present

## 2023-04-03 DIAGNOSIS — Z743 Need for continuous supervision: Secondary | ICD-10-CM | POA: Diagnosis not present

## 2023-04-03 DIAGNOSIS — J441 Chronic obstructive pulmonary disease with (acute) exacerbation: Secondary | ICD-10-CM | POA: Diagnosis not present

## 2023-04-03 DIAGNOSIS — Z8739 Personal history of other diseases of the musculoskeletal system and connective tissue: Secondary | ICD-10-CM | POA: Diagnosis not present

## 2023-04-03 DIAGNOSIS — R0989 Other specified symptoms and signs involving the circulatory and respiratory systems: Secondary | ICD-10-CM | POA: Diagnosis not present

## 2023-04-03 DIAGNOSIS — R0689 Other abnormalities of breathing: Secondary | ICD-10-CM | POA: Diagnosis not present

## 2023-04-03 DIAGNOSIS — G4733 Obstructive sleep apnea (adult) (pediatric): Secondary | ICD-10-CM | POA: Diagnosis not present

## 2023-04-03 DIAGNOSIS — R2689 Other abnormalities of gait and mobility: Secondary | ICD-10-CM | POA: Diagnosis not present

## 2023-04-03 DIAGNOSIS — N179 Acute kidney failure, unspecified: Secondary | ICD-10-CM | POA: Diagnosis not present

## 2023-04-03 DIAGNOSIS — R1310 Dysphagia, unspecified: Secondary | ICD-10-CM | POA: Diagnosis not present

## 2023-04-03 DIAGNOSIS — E1169 Type 2 diabetes mellitus with other specified complication: Secondary | ICD-10-CM | POA: Diagnosis not present

## 2023-04-03 DIAGNOSIS — Z794 Long term (current) use of insulin: Secondary | ICD-10-CM | POA: Diagnosis not present

## 2023-04-03 DIAGNOSIS — M170 Bilateral primary osteoarthritis of knee: Secondary | ICD-10-CM | POA: Diagnosis not present

## 2023-04-03 DIAGNOSIS — Z9981 Dependence on supplemental oxygen: Secondary | ICD-10-CM | POA: Diagnosis not present

## 2023-04-03 DIAGNOSIS — R918 Other nonspecific abnormal finding of lung field: Secondary | ICD-10-CM | POA: Diagnosis not present

## 2023-04-03 DIAGNOSIS — J984 Other disorders of lung: Secondary | ICD-10-CM | POA: Diagnosis not present

## 2023-04-03 DIAGNOSIS — J811 Chronic pulmonary edema: Secondary | ICD-10-CM | POA: Diagnosis not present

## 2023-04-03 DIAGNOSIS — R0902 Hypoxemia: Secondary | ICD-10-CM | POA: Diagnosis not present

## 2023-04-03 DIAGNOSIS — R6889 Other general symptoms and signs: Secondary | ICD-10-CM | POA: Diagnosis not present

## 2023-04-03 DIAGNOSIS — I739 Peripheral vascular disease, unspecified: Secondary | ICD-10-CM | POA: Diagnosis not present

## 2023-04-03 DIAGNOSIS — R2681 Unsteadiness on feet: Secondary | ICD-10-CM | POA: Diagnosis not present

## 2023-04-03 DIAGNOSIS — J9811 Atelectasis: Secondary | ICD-10-CM | POA: Diagnosis not present

## 2023-04-03 DIAGNOSIS — N183 Chronic kidney disease, stage 3 unspecified: Secondary | ICD-10-CM | POA: Diagnosis not present

## 2023-04-03 DIAGNOSIS — Z7982 Long term (current) use of aspirin: Secondary | ICD-10-CM | POA: Diagnosis not present

## 2023-04-03 DIAGNOSIS — I130 Hypertensive heart and chronic kidney disease with heart failure and stage 1 through stage 4 chronic kidney disease, or unspecified chronic kidney disease: Secondary | ICD-10-CM | POA: Diagnosis not present

## 2023-04-03 DIAGNOSIS — I517 Cardiomegaly: Secondary | ICD-10-CM | POA: Diagnosis not present

## 2023-04-03 DIAGNOSIS — D649 Anemia, unspecified: Secondary | ICD-10-CM | POA: Diagnosis not present

## 2023-04-03 LAB — HEMOGLOBIN A1C: Hemoglobin A1C: 7.3

## 2023-04-03 NOTE — Telephone Encounter (Signed)
 Copied from CRM [id number]. Topic: Clinical - Pink Word Triage >> Apr 03, 2023 12:54 PM Adaysia C wrote: Reason for Triage: Patients caregiver Pam called in to report that the patient is having an anxiety attack and is struggling to breathe; warm transferred to nurse triage   Chief Complaint: Anxiety   Symptoms: Dyspnea, Wheezing, S  Frequency: Acute  Pertinent Negatives: Patient denies chest pain, pallor, cyanosis.  Disposition: [x] ED /[] Urgent Care (no appt availability in office) / [] Appointment(In office/virtual)/ []  [facility]/ [] Home Care/ [] Refused Recommended Disposition /[] [facility]/ []  Follow-up with PCP Additional Notes:  JB is being triaged for anxiety and panic attacks.  The patient has experienced wheezing and a headache. The patient has had a breathing treatment and is still experiencing difficulty breathing. The patient's caregiver has been with her for over an hour and the patient feels like she is still struggling to breath. Recommended the patient seek emergency care as soon as possible for evaluation and treatment due to current presentation. Agreed to disposition and verbalized understanding.   Reason for Disposition  [1] Difficulty breathing AND [2] persists > 10 minutes AND [3] not relieved by reassurance provided by triager  Answer Assessment - Initial Assessment Questions 1. CONCERN: "Did anything happen that prompted you to call today?"      Difficulty breathing  2. ANXIETY SYMPTOMS: "Can you describe how you (your loved one; patient) have been feeling?" (e.g., tense, restless, panicky, anxious, keyed up, overwhelmed, sense of impending doom).      Anxious  3. ONSET: "How long have you been feeling this way?" (e.g., hours, days, weeks)     Currently, Days  4. SEVERITY: "How would you rate the level of anxiety?" (e.g., 0 - 10; or mild, moderate, severe).     Severe  5. FUNCTIONAL IMPAIRMENT: "How have these feelings affected your  ability to do daily activities?" "Have you had more difficulty than usual doing your normal daily activities?" (e.g., getting better, same, worse; self-care, school, work, interactions)     Worse  6. HISTORY: "Have you felt this way before?" "Have you ever been diagnosed with an anxiety problem in the past?" (e.g., generalized anxiety disorder, panic attacks, PTSD). If Yes, ask: "How was this problem treated?" (e.g., medicines, counseling, etc.)      Yes, Anxiety  7. RISK OF HARM - SUICIDAL IDEATION: "Do you ever have thoughts of hurting or killing yourself?" If Yes, ask:  "Do you have these feelings now?" "Do you have a plan on how you would do this?"     No  8. TREATMENT:  "What has been done so far to treat this anxiety?" (e.g., medicines, relaxation strategies). "What has helped?"     Breathing treatments  9. TREATMENT - THERAPIST: "Do you have a counselor or therapist? Name?"     Unsure  10. POTENTIAL TRIGGERS: "Do you drink caffeinated beverages (e.g., coffee, colas, teas), and how much daily?" "Do you drink alcohol or use any drugs?" "Have you started any new medicines recently?"       No  11. PATIENT SUPPORT: "Who is with you now?" "Who do you live with?" "Do you have family or friends who you can talk to?"        Caregiver  12. OTHER SYMPTOMS: "Do you have any other symptoms?" (e.g., feeling depressed, trouble concentrating, trouble sleeping, trouble breathing, palpitations or fast heartbeat, chest pain, sweating, nausea, or diarrhea)       Trouble breath;ing  13. PREGNANCY: "Is  there any chance you are pregnant?" "When was your last menstrual period?"       No and No  Protocols used: Anxiety and Panic Attack-A-AH

## 2023-04-04 DIAGNOSIS — R0603 Acute respiratory distress: Secondary | ICD-10-CM | POA: Diagnosis not present

## 2023-04-05 DIAGNOSIS — Z8739 Personal history of other diseases of the musculoskeletal system and connective tissue: Secondary | ICD-10-CM | POA: Insufficient documentation

## 2023-04-05 HISTORY — DX: Personal history of other diseases of the musculoskeletal system and connective tissue: Z87.39

## 2023-04-07 NOTE — Addendum Note (Signed)
 Addended by: Seabron Spates R on: 04/07/2023 09:36 AM   Modules accepted: Orders

## 2023-04-10 ENCOUNTER — Other Ambulatory Visit: Payer: Self-pay | Admitting: Family Medicine

## 2023-04-16 ENCOUNTER — Telehealth: Payer: Self-pay | Admitting: Neurology

## 2023-04-16 NOTE — Telephone Encounter (Signed)
 Copied from CRM [id number]. Topic: Clinical - Medical Advice >> Apr 13, 2023  4:14 PM Adaysia C wrote: Reason for CRM: Kristy(Discharge Case Manager) called to inform the provider that the patient was admitted into [facility]) due to respiratory failure/COPD; If necessary please follow up with Kristy(Discharge Case Manager)#[phone]

## 2023-04-16 NOTE — Telephone Encounter (Signed)
 FYI

## 2023-04-18 DIAGNOSIS — I350 Nonrheumatic aortic (valve) stenosis: Secondary | ICD-10-CM | POA: Diagnosis not present

## 2023-04-18 DIAGNOSIS — G4700 Insomnia, unspecified: Secondary | ICD-10-CM | POA: Diagnosis not present

## 2023-04-18 DIAGNOSIS — D509 Iron deficiency anemia, unspecified: Secondary | ICD-10-CM | POA: Diagnosis not present

## 2023-04-18 DIAGNOSIS — M6281 Muscle weakness (generalized): Secondary | ICD-10-CM | POA: Diagnosis not present

## 2023-04-18 DIAGNOSIS — N183 Chronic kidney disease, stage 3 unspecified: Secondary | ICD-10-CM | POA: Diagnosis not present

## 2023-04-18 DIAGNOSIS — G4733 Obstructive sleep apnea (adult) (pediatric): Secondary | ICD-10-CM | POA: Diagnosis not present

## 2023-04-18 DIAGNOSIS — R1310 Dysphagia, unspecified: Secondary | ICD-10-CM | POA: Diagnosis not present

## 2023-04-18 DIAGNOSIS — R531 Weakness: Secondary | ICD-10-CM | POA: Diagnosis not present

## 2023-04-18 DIAGNOSIS — I5032 Chronic diastolic (congestive) heart failure: Secondary | ICD-10-CM | POA: Diagnosis not present

## 2023-04-18 DIAGNOSIS — E1122 Type 2 diabetes mellitus with diabetic chronic kidney disease: Secondary | ICD-10-CM | POA: Diagnosis not present

## 2023-04-18 DIAGNOSIS — R0603 Acute respiratory distress: Secondary | ICD-10-CM | POA: Diagnosis not present

## 2023-04-18 DIAGNOSIS — I509 Heart failure, unspecified: Secondary | ICD-10-CM | POA: Diagnosis not present

## 2023-04-18 DIAGNOSIS — J441 Chronic obstructive pulmonary disease with (acute) exacerbation: Secondary | ICD-10-CM | POA: Diagnosis not present

## 2023-04-18 DIAGNOSIS — I2510 Atherosclerotic heart disease of native coronary artery without angina pectoris: Secondary | ICD-10-CM | POA: Diagnosis not present

## 2023-04-18 DIAGNOSIS — J9690 Respiratory failure, unspecified, unspecified whether with hypoxia or hypercapnia: Secondary | ICD-10-CM | POA: Diagnosis not present

## 2023-04-18 DIAGNOSIS — I10 Essential (primary) hypertension: Secondary | ICD-10-CM | POA: Diagnosis not present

## 2023-04-18 DIAGNOSIS — J9622 Acute and chronic respiratory failure with hypercapnia: Secondary | ICD-10-CM | POA: Diagnosis not present

## 2023-04-18 DIAGNOSIS — I119 Hypertensive heart disease without heart failure: Secondary | ICD-10-CM | POA: Diagnosis not present

## 2023-04-18 DIAGNOSIS — I739 Peripheral vascular disease, unspecified: Secondary | ICD-10-CM | POA: Diagnosis not present

## 2023-04-18 DIAGNOSIS — E119 Type 2 diabetes mellitus without complications: Secondary | ICD-10-CM | POA: Diagnosis not present

## 2023-04-18 DIAGNOSIS — Z8739 Personal history of other diseases of the musculoskeletal system and connective tissue: Secondary | ICD-10-CM | POA: Diagnosis not present

## 2023-04-18 DIAGNOSIS — R0602 Shortness of breath: Secondary | ICD-10-CM | POA: Diagnosis not present

## 2023-04-18 DIAGNOSIS — R2689 Other abnormalities of gait and mobility: Secondary | ICD-10-CM | POA: Diagnosis not present

## 2023-04-18 DIAGNOSIS — J9611 Chronic respiratory failure with hypoxia: Secondary | ICD-10-CM | POA: Diagnosis not present

## 2023-04-18 DIAGNOSIS — M170 Bilateral primary osteoarthritis of knee: Secondary | ICD-10-CM | POA: Diagnosis not present

## 2023-04-18 DIAGNOSIS — I422 Other hypertrophic cardiomyopathy: Secondary | ICD-10-CM | POA: Diagnosis not present

## 2023-04-18 DIAGNOSIS — I129 Hypertensive chronic kidney disease with stage 1 through stage 4 chronic kidney disease, or unspecified chronic kidney disease: Secondary | ICD-10-CM | POA: Diagnosis not present

## 2023-04-18 DIAGNOSIS — Z743 Need for continuous supervision: Secondary | ICD-10-CM | POA: Diagnosis not present

## 2023-04-18 DIAGNOSIS — I421 Obstructive hypertrophic cardiomyopathy: Secondary | ICD-10-CM | POA: Diagnosis not present

## 2023-04-18 DIAGNOSIS — J449 Chronic obstructive pulmonary disease, unspecified: Secondary | ICD-10-CM | POA: Diagnosis not present

## 2023-04-18 DIAGNOSIS — N1832 Chronic kidney disease, stage 3b: Secondary | ICD-10-CM | POA: Diagnosis not present

## 2023-04-18 DIAGNOSIS — R2681 Unsteadiness on feet: Secondary | ICD-10-CM | POA: Diagnosis not present

## 2023-04-18 DIAGNOSIS — J9620 Acute and chronic respiratory failure, unspecified whether with hypoxia or hypercapnia: Secondary | ICD-10-CM | POA: Diagnosis not present

## 2023-04-18 DIAGNOSIS — M109 Gout, unspecified: Secondary | ICD-10-CM | POA: Diagnosis not present

## 2023-04-18 DIAGNOSIS — E785 Hyperlipidemia, unspecified: Secondary | ICD-10-CM | POA: Diagnosis not present

## 2023-04-19 DIAGNOSIS — I5032 Chronic diastolic (congestive) heart failure: Secondary | ICD-10-CM | POA: Diagnosis not present

## 2023-04-19 DIAGNOSIS — I350 Nonrheumatic aortic (valve) stenosis: Secondary | ICD-10-CM | POA: Diagnosis not present

## 2023-04-19 DIAGNOSIS — M6281 Muscle weakness (generalized): Secondary | ICD-10-CM | POA: Diagnosis not present

## 2023-04-19 DIAGNOSIS — J441 Chronic obstructive pulmonary disease with (acute) exacerbation: Secondary | ICD-10-CM | POA: Diagnosis not present

## 2023-04-19 DIAGNOSIS — R2689 Other abnormalities of gait and mobility: Secondary | ICD-10-CM | POA: Diagnosis not present

## 2023-04-19 DIAGNOSIS — J9620 Acute and chronic respiratory failure, unspecified whether with hypoxia or hypercapnia: Secondary | ICD-10-CM | POA: Diagnosis not present

## 2023-04-19 DIAGNOSIS — E119 Type 2 diabetes mellitus without complications: Secondary | ICD-10-CM | POA: Diagnosis not present

## 2023-04-19 DIAGNOSIS — J9622 Acute and chronic respiratory failure with hypercapnia: Secondary | ICD-10-CM | POA: Diagnosis not present

## 2023-04-19 DIAGNOSIS — N183 Chronic kidney disease, stage 3 unspecified: Secondary | ICD-10-CM | POA: Diagnosis not present

## 2023-04-19 DIAGNOSIS — I422 Other hypertrophic cardiomyopathy: Secondary | ICD-10-CM | POA: Diagnosis not present

## 2023-04-23 DIAGNOSIS — M6281 Muscle weakness (generalized): Secondary | ICD-10-CM | POA: Diagnosis not present

## 2023-04-23 DIAGNOSIS — R2689 Other abnormalities of gait and mobility: Secondary | ICD-10-CM | POA: Diagnosis not present

## 2023-04-23 DIAGNOSIS — J9622 Acute and chronic respiratory failure with hypercapnia: Secondary | ICD-10-CM | POA: Diagnosis not present

## 2023-04-23 DIAGNOSIS — J441 Chronic obstructive pulmonary disease with (acute) exacerbation: Secondary | ICD-10-CM | POA: Diagnosis not present

## 2023-04-26 DIAGNOSIS — J441 Chronic obstructive pulmonary disease with (acute) exacerbation: Secondary | ICD-10-CM | POA: Diagnosis not present

## 2023-04-26 DIAGNOSIS — R2689 Other abnormalities of gait and mobility: Secondary | ICD-10-CM | POA: Diagnosis not present

## 2023-04-26 DIAGNOSIS — J9622 Acute and chronic respiratory failure with hypercapnia: Secondary | ICD-10-CM | POA: Diagnosis not present

## 2023-04-26 DIAGNOSIS — M6281 Muscle weakness (generalized): Secondary | ICD-10-CM | POA: Diagnosis not present

## 2023-04-27 DIAGNOSIS — G4700 Insomnia, unspecified: Secondary | ICD-10-CM | POA: Diagnosis not present

## 2023-04-28 DIAGNOSIS — I5032 Chronic diastolic (congestive) heart failure: Secondary | ICD-10-CM | POA: Diagnosis not present

## 2023-04-30 ENCOUNTER — Encounter: Payer: Self-pay | Admitting: Cardiology

## 2023-04-30 DIAGNOSIS — M6281 Muscle weakness (generalized): Secondary | ICD-10-CM | POA: Diagnosis not present

## 2023-04-30 DIAGNOSIS — J9622 Acute and chronic respiratory failure with hypercapnia: Secondary | ICD-10-CM | POA: Diagnosis not present

## 2023-04-30 DIAGNOSIS — J441 Chronic obstructive pulmonary disease with (acute) exacerbation: Secondary | ICD-10-CM | POA: Diagnosis not present

## 2023-04-30 DIAGNOSIS — R2689 Other abnormalities of gait and mobility: Secondary | ICD-10-CM | POA: Diagnosis not present

## 2023-05-01 ENCOUNTER — Ambulatory Visit: Admitting: Cardiology

## 2023-05-01 DIAGNOSIS — J9620 Acute and chronic respiratory failure, unspecified whether with hypoxia or hypercapnia: Secondary | ICD-10-CM | POA: Diagnosis not present

## 2023-05-01 DIAGNOSIS — Z8739 Personal history of other diseases of the musculoskeletal system and connective tissue: Secondary | ICD-10-CM | POA: Diagnosis not present

## 2023-05-01 DIAGNOSIS — N183 Chronic kidney disease, stage 3 unspecified: Secondary | ICD-10-CM | POA: Diagnosis not present

## 2023-05-01 DIAGNOSIS — I5032 Chronic diastolic (congestive) heart failure: Secondary | ICD-10-CM | POA: Diagnosis not present

## 2023-05-01 DIAGNOSIS — E119 Type 2 diabetes mellitus without complications: Secondary | ICD-10-CM | POA: Diagnosis not present

## 2023-05-02 ENCOUNTER — Ambulatory Visit: Attending: Cardiology | Admitting: Cardiology

## 2023-05-02 ENCOUNTER — Encounter: Payer: Self-pay | Admitting: Cardiology

## 2023-05-02 VITALS — BP 152/74 | HR 78 | Ht 63.0 in | Wt 302.4 lb

## 2023-05-02 DIAGNOSIS — I10 Essential (primary) hypertension: Secondary | ICD-10-CM

## 2023-05-02 DIAGNOSIS — I119 Hypertensive heart disease without heart failure: Secondary | ICD-10-CM

## 2023-05-02 DIAGNOSIS — N1832 Chronic kidney disease, stage 3b: Secondary | ICD-10-CM

## 2023-05-02 DIAGNOSIS — I5032 Chronic diastolic (congestive) heart failure: Secondary | ICD-10-CM | POA: Diagnosis not present

## 2023-05-02 DIAGNOSIS — I421 Obstructive hypertrophic cardiomyopathy: Secondary | ICD-10-CM | POA: Diagnosis not present

## 2023-05-02 DIAGNOSIS — I422 Other hypertrophic cardiomyopathy: Secondary | ICD-10-CM

## 2023-05-02 DIAGNOSIS — J9611 Chronic respiratory failure with hypoxia: Secondary | ICD-10-CM | POA: Diagnosis not present

## 2023-05-02 DIAGNOSIS — E119 Type 2 diabetes mellitus without complications: Secondary | ICD-10-CM

## 2023-05-02 NOTE — Progress Notes (Signed)
 Cardiology Office Note:    Date:  05/02/2023   ID:  Morgan Fischer, DOB 04-01-1950, MRN [id number]  PCP:  Crecencio Dodge, Candida Chalk, DO  Cardiologist:  Nelia Balzarine, MD   Referring MD: Crecencio Dodge, Candida Chalk, *    ASSESSMENT:    1. Hypertrophic cardiomyopathy (HCC)   2. Hypertensive heart disease without congestive heart failure   3. HOCM (hypertrophic obstructive cardiomyopathy) (HCC)   4. Essential hypertension   5. Chronic diastolic heart failure (HCC)   6. Chronic hypoxemic respiratory failure (HCC)   7. Type 2 diabetes mellitus without complication, without long-term current use of insulin  (HCC)   8. Chronic kidney disease, stage 3b (HCC)   9. Morbid obesity (HCC)    PLAN:    In order of problems listed above:  Primary prevention stressed with the patient.  Importance of compliance with diet medication stressed and patient verbalized standing. Hypertrophic obstructive cardiomyopathy: Patient is known to have this diagnosis.  Medical management at this time.  She has been extensively evaluated also at [facility] in the past.  She is asymptomatic. Essential hypertension: Blood pressure is stable and diet was emphasized.  She tells me that her blood pressure at the facility at times is borderline and he is very conscious about her blood pressures. Congestive heart failure with preserved ejection fraction: Salt intake and daily weight checks were discussed and she vocalized understanding.  Questions were answered to her satisfaction. Morbid obesity: Weight reduction stressed diet emphasized and she promises to do better.  Also her blood work will need to be monitored by primary care on a regular basis in view of multiple medications that she is on. Patient will be seen in follow-up appointment in 6 months or earlier if the patient has any concerns.    Medication Adjustments/Labs and Tests Ordered: Current medicines are reviewed at length with the patient today.  Concerns regarding  medicines are outlined above.  No orders of the defined types were placed in this encounter.  No orders of the defined types were placed in this encounter.    No chief complaint on file.    History of Present Illness:    Morgan Fischer is a 73 y.o. female.  Patient has past medical history of essential hypertension, mixed dyslipidemia, diabetes mellitus, hypertrophic cardiomyopathy and morbid obesity and COPD.  She was recently admitted to the hospital for congestive heart failure.  She was treated and recently stopped.  She was in acute respiratory distress at that time.  I reviewed hospital records and echocardiogram at length and discussed with the patient.  She is morbidly obese and brought in in a wheelchair.  She is undergoing rehab but tells me that she will be discharged tomorrow.  She tells me that providers at the facility are doing blood work for her regularly.  Including the last 1 which was done on Monday.  At the time of my evaluation, the patient is alert awake oriented and in no distress.  Past Medical History:  Diagnosis Date   Acute diastolic heart failure (HCC) 07/17/2019   Acute hypercapnic respiratory failure (HCC) 07/21/2016   Acute on chronic congestive heart failure (HCC) 07/19/2016   Acute on chronic diastolic CHF (congestive heart failure) (HCC) 03/08/2020   Acute on chronic respiratory failure with hypoxia (HCC) 10/20/2018   Acute on chronic respiratory failure with hypoxia and hypercapnia (HCC) 03/08/2020   Acute respiratory acidosis (HCC) 07/19/2016   Acute respiratory distress 07/19/2016   Acute respiratory failure (HCC) 03/08/2020  Anxiety 03/15/2020   Aortic stenosis 11/01/2018   Arthritis    Asthma    Atherosclerosis of native coronary artery of native heart without angina pectoris 03/17/2016   Cardiomyopathy (HCC) 03/15/2020   Chest pain 07/14/2019   CHF (congestive heart failure) (HCC)    Chronic diastolic heart failure (HCC) 03/17/2016   Chronic  hypoxemic respiratory failure (HCC)    Chronic kidney disease, stage 3b (HCC) 02/04/2021   CKD (chronic kidney disease)    COPD (chronic obstructive pulmonary disease) (HCC)    COPD with acute exacerbation (HCC) 03/08/2020   Diabetes mellitus without complication (HCC)    Drug allergy 07/21/2016   Essential (primary) hypertension 11/19/2016   Essential hypertension 11/19/2016   Gout 11/19/2016   Heart block AV complete (HCC)    High cholesterol    History of gout 04/05/2023   HOCM (hypertrophic obstructive cardiomyopathy) (HCC) 09/18/2017   Hyperlipidemia associated with type 2 diabetes mellitus (HCC) 11/23/2022   Hyperlipidemia, unspecified 03/15/2020   Hypertension    Hypertensive heart disease with heart failure (HCC) 03/17/2016   Hypertensive heart disease without congestive heart failure 03/15/2020   Hypertrophic cardiomyopathy (HCC) 04/12/2020   Hypokalemia 08/24/2015   Hypomagnesemia    Hypoxia 03/17/2016   Insomnia 11/23/2022   Iron deficiency 03/15/2020   Leukocytosis 07/19/2016   Long term (current) use of insulin  (HCC) 02/04/2021   Mild intermittent asthma 02/04/2021   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   Mixed hyperlipidemia 03/15/2020   Morbid obesity (HCC)    Morbid obesity with BMI of 60.0-69.9, adult (HCC) 07/21/2016   Need for influenza vaccination 11/23/2022   Need for pneumococcal 20-valent conjugate vaccination 11/23/2022   Nonrheumatic aortic valve stenosis 03/17/2016   Obesity hypoventilation syndrome (HCC) 03/08/2020   Obstructive sleep apnea 11/19/2016   Osteoarthritis of knee 03/15/2020   Panniculitis 08/23/2015   Peripheral venous insufficiency 03/15/2020   Pure hypercholesterolemia 02/13/2017   SOB (shortness of breath) 08/24/2015   Troponin level elevated 08/24/2015   Type 2 diabetes mellitus with hyperglycemia (HCC) 07/19/2016   Type 2 diabetes mellitus with stage 3 chronic kidney disease, with long-term current use of insulin  (HCC) 11/19/2016    Type 2 diabetes mellitus without complication, without long-term current use of insulin  (HCC) 04/12/2020   Type 2 diabetes mellitus without complications (HCC) 03/15/2020   Type 2 diabetes mellitus, without long-term current use of insulin  (HCC) 11/19/2016    Past Surgical History:  Procedure Laterality Date   ABDOMINAL HYSTERECTOMY     cellulitis debridement  2000   abdomen   CESAREAN SECTION      Current Medications: Current Meds  Medication Sig   allopurinol  (ZYLOPRIM ) 300 MG tablet Take 300 mg by mouth daily.   aspirin  EC 81 MG EC tablet Take 1 tablet (81 mg total) by mouth daily.   Cholecalciferol  50 MCG (2000 UT) CAPS Take 2,000 Units by mouth daily.   Cyanocobalamin (VITAMIN B-12 PO) Take 1 tablet by mouth daily.   dapagliflozin propanediol (FARXIGA) 10 MG TABS tablet Take 10 mg by mouth daily.   diclofenac Sodium (VOLTAREN) 1 % GEL Apply 1 application topically as needed for pain (pain).   disopyramide  (NORPACE  CR) 100 MG 12 hr capsule Take 200 mg by mouth every 12 (twelve) hours. (Patient taking differently: Take 100 mg by mouth 3 (three) times daily.)   ferrous sulfate  325 (65 FE) MG EC tablet Take 1 tablet by mouth daily.   fluticasone  furoate-vilanterol (BREO ELLIPTA ) 100-25 MCG/ACT AEPB Inhale 1 puff into the lungs daily.  furosemide  (LASIX ) 20 MG tablet Take 1 tablet by mouth daily.   Garlic 1000 MG CAPS Take 1,000 mg by mouth daily.   hydrochlorothiazide (MICROZIDE) 12.5 MG capsule Take 12.5 mg by mouth daily. (Patient taking differently: Take 25 mg by mouth daily.)   ipratropium-albuterol  (DUONEB) 0.5-2.5 (3) MG/3ML SOLN Inhale 3 mLs into the lungs every 4 (four) hours as needed for wheezing (wheezing).   JANUVIA 25 MG tablet Take 25 mg by mouth daily.   Lactobacillus [facility] WOMEN) CAPS Take 1 capsule by mouth daily as needed.   LANTUS  SOLOSTAR 100 UNIT/ML Solostar Pen Inject 15 Units into the skin at bedtime.   metoprolol  succinate (TOPROL -XL) 50 MG 24 hr tablet  Take 1 tablet (50 mg total) by mouth daily.   montelukast  (SINGULAIR ) 10 MG tablet Take 1 tablet (10 mg total) by mouth at bedtime.   Multiple Vitamin (MULTIVITAMIN WITH MINERALS) TABS tablet Take 1 tablet by mouth daily.   nitroGLYCERIN  (NITROSTAT ) 0.4 MG SL tablet Place 0.4 mg under the tongue every 5 (five) minutes as needed.   Omega-3 Fatty Acids (FISH OIL) 1000 MG CAPS Take 1,000 mg by mouth 2 (two) times daily.    OXYGEN  Inhale 2 L into the lungs continuous.   polyethylene glycol (MIRALAX  / GLYCOLAX ) 17 g packet Take 17 g by mouth as needed for mild constipation.   Probiotic Product (PROBIOTIC PO) Take 1 tablet by mouth daily in the afternoon.   rosuvastatin  (CRESTOR ) 10 MG tablet Take 1 tablet (10 mg total) by mouth daily.   sertraline  (ZOLOFT ) 100 MG tablet Take 1 tablet (100 mg total) by mouth daily.   spironolactone (ALDACTONE) 25 MG tablet Take 25 mg by mouth every Monday, Wednesday, and Friday.   Suvorexant  (BELSOMRA ) 20 MG TABS Take 1 tablet (20 mg total) by mouth at bedtime as needed.   Turmeric 500 MG CAPS Take 1 capsule by mouth daily.   VENTOLIN  HFA 108 (90 Base) MCG/ACT inhaler Inhale 2 puffs into the lungs every 6 (six) hours as needed for wheezing or shortness of breath.      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  Tobacco Use   Smoking status: Never   Smokeless tobacco: Never  Vaping Use   Vaping status: Never Used  Substance and Sexual Activity   Alcohol use: No   Drug use: No   Sexual activity: Never  Other Topics Concern   Not on  file  Social History Narrative   Not on file   Social Drivers of Health   Financial Resource Strain: Patient Declined (02/13/2023)   Overall Financial Resource Strain (CARDIA)    Difficulty of Paying Living Expenses: Patient declined  Food Insecurity: No Food Insecurity (02/13/2023)   Hunger Vital Sign    Worried About Running Out of Food in the Last Year: Never true    Ran Out of Food in the Last Year: Never true  Transportation Needs: No Transportation Needs (02/13/2023)   PRAPARE - Administrator, Civil Service (Medical): No    Lack of Transportation (Non-Medical): No  Physical Activity: Unknown (02/13/2023)   Exercise Vital Sign    Days of Exercise per Week: 0 days    Minutes of Exercise per Session: Not on file  Stress: No Stress Concern Present (02/13/2023)   [employer] of Occupational Health - Occupational Stress Questionnaire    Feeling of Stress : Only a little  Social Connections: Moderately Integrated (02/13/2023)   Social Connection and Isolation Panel [NHANES]    Frequency of Communication with Friends and Family: More than three times a week    Frequency of Social Gatherings with Friends and Family: More than three times a week    Attends Religious Services: More than 4 times per year    Active Member of [employer] or Organizations: Yes    Attends Banker Meetings: Patient declined    Marital Status: Widowed     Family History: The patient's family history includes Breast cancer in her sister; CAD in her mother; Cancer in her father and sister; Diabetes in her mother; Diabetes Mellitus I in her mother; Heart disease in her father and sister; Prostate cancer in her brother.  ROS:   Please see the history of present illness.    All other systems reviewed and are negative.  EKGs/Labs/Other Studies Reviewed:    The following studies were reviewed today: I discussed my findings with the patient at length   Recent Labs: 09/28/2022: Magnesium   2.8 11/23/2022: Hemoglobin 11.3; Platelets 148.0 02/27/2023: ALT 12; BUN 38; Creatinine, Ser 1.29; Potassium 4.1; Sodium 143  Recent Lipid Panel    Component Value Date/Time   CHOL 153 02/27/2023 1049   TRIG 106.0 02/27/2023 1049   HDL 37.30 (L) 02/27/2023 1049   CHOLHDL 4 02/27/2023 1049   VLDL 21.2 02/27/2023 1049   LDLCALC 94 02/27/2023 1049    Physical Exam:    VS:  BP (!) 152/74   Pulse 78   Ht 5\' 3"  (1.6 m)   Wt (!) 302 lb 6.4 oz (137.2 kg)   LMP  (LMP Unknown)   SpO2 97%   BMI 53.57 kg/m     Wt Readings from Last 3 Encounters:  05/02/23 (!) 302 lb 6.4 oz (137.2 kg)  02/27/23 281 lb 11.2 oz (127.8 kg)  02/13/23 283 lb (128.4 kg)     GEN: Patient is in no acute distress HEENT: Normal NECK: No JVD; No carotid bruits LYMPHATICS: No lymphadenopathy CARDIAC: Hear sounds regular, 2/6 systolic murmur at the apex. RESPIRATORY:  Clear to auscultation without rales, wheezing or rhonchi  ABDOMEN: Soft, non-tender, non-distended MUSCULOSKELETAL:  No edema; No deformity  SKIN: Warm and dry NEUROLOGIC:  Alert and oriented x 3 PSYCHIATRIC:  Normal affect   Signed, Nelia Balzarine, MD  05/02/2023 1:37 PM    [facility]

## 2023-05-02 NOTE — Patient Instructions (Signed)
 Medication Instructions:  Your physician recommends that you continue on your current medications as directed. Please refer to the Current Medication list given to you today.  *If you need a refill on your cardiac medications before your next appointment, please call your pharmacy*   Lab Work: None Ordered If you have labs (blood work) drawn today and your tests are completely normal, you will receive your results only by: MyChart Message (if you have MyChart) OR A paper copy in the mail If you have any lab test that is abnormal or we need to change your treatment, we will call you to review the results.   Testing/Procedures: None Ordered   Follow-Up: At [facility], you and your health needs are our priority.  As part of our continuing mission to provide you with exceptional heart care, we have created designated Provider Care Teams.  These Care Teams include your primary Cardiologist (physician) and Advanced Practice Providers (APPs -  Physician Assistants and Nurse Practitioners) who all work together to provide you with the care you need, when you need it.  We recommend signing up for the patient portal called "MyChart".  Sign up information is provided on this After Visit Summary.  MyChart is used to connect with patients for Virtual Visits (Telemedicine).  Patients are able to view lab/test results, encounter notes, upcoming appointments, etc.  Non-urgent messages can be sent to your provider as well.   To learn more about what you can do with MyChart, go to [email or web address].    Your next appointment:   9 month follow up

## 2023-05-03 DIAGNOSIS — J449 Chronic obstructive pulmonary disease, unspecified: Secondary | ICD-10-CM | POA: Diagnosis not present

## 2023-05-03 DIAGNOSIS — I10 Essential (primary) hypertension: Secondary | ICD-10-CM | POA: Diagnosis not present

## 2023-05-03 DIAGNOSIS — G4733 Obstructive sleep apnea (adult) (pediatric): Secondary | ICD-10-CM | POA: Diagnosis not present

## 2023-05-04 ENCOUNTER — Telehealth: Payer: Self-pay

## 2023-05-04 NOTE — Transitions of Care (Post Inpatient/ED Visit) (Unsigned)
   05/04/2023  Name: Morgan Fischer MRN: [id number] DOB: December 29, 1950  Today's TOC FU Call Status: Today's TOC FU Call Status:: Unsuccessful Call (1st Attempt) Unsuccessful Call (1st Attempt) Date: 05/04/23  Attempted to reach the patient regarding the most recent Inpatient/ED visit.  Follow Up Plan: Additional outreach attempts will be made to reach the patient to complete the Transitions of Care (Post Inpatient/ED visit) call.   Signature Darrall Ellison, LPN [facility] Nurse Health Advisor Direct Dial [phone]

## 2023-05-07 NOTE — Telephone Encounter (Signed)
 Patient called back in regard to a missed call from Lanette on Friday. Tried to reach the direct number listed for her but was not available. Advised patient I would leave a note that she had tried to reach her back. Please call patient when available.

## 2023-05-08 DIAGNOSIS — M356 Relapsing panniculitis [Weber-Christian]: Secondary | ICD-10-CM | POA: Diagnosis not present

## 2023-05-08 DIAGNOSIS — J45909 Unspecified asthma, uncomplicated: Secondary | ICD-10-CM | POA: Diagnosis not present

## 2023-05-08 DIAGNOSIS — J449 Chronic obstructive pulmonary disease, unspecified: Secondary | ICD-10-CM | POA: Diagnosis not present

## 2023-05-08 DIAGNOSIS — G4733 Obstructive sleep apnea (adult) (pediatric): Secondary | ICD-10-CM | POA: Diagnosis not present

## 2023-05-08 DIAGNOSIS — R269 Unspecified abnormalities of gait and mobility: Secondary | ICD-10-CM | POA: Diagnosis not present

## 2023-05-08 NOTE — Transitions of Care (Post Inpatient/ED Visit) (Signed)
 05/08/2023  Name: Morgan Fischer MRN: [id number] DOB: July 05, 1950  Today's TOC FU Call Status: Today's TOC FU Call Status:: Successful TOC FU Call Completed Unsuccessful Call (1st Attempt) Date: 05/04/23 [facility] FU Call Complete Date: 05/08/23 Patient's Name and Date of Birth confirmed.  Transition Care Management Follow-up Telephone Call Date of Discharge: 05/03/23 Discharge Facility: Other ([facility]) Name of Other ([facility]) Discharge Facility: [facility] Type of Discharge: Inpatient Admission Primary Inpatient Discharge Diagnosis:: weakness How have you been since you were released from the hospital?: Better Any questions or concerns?: No  Items Reviewed: Did you receive and understand the discharge instructions provided?: Yes Medications obtained,verified, and reconciled?: Yes (Medications Reviewed) Any new allergies since your discharge?: No Dietary orders reviewed?: Yes Do you have support at home?: Yes Name of Support/Comfort Primary Source: caregiver  Medications Reviewed Today: Medications Reviewed Today     Reviewed by Darrall Ellison, LPN (Licensed Practical Nurse) on 05/08/23 at 1110  Med List Status: <None>   Medication Order Taking? Sig Documenting Provider Last Dose Status Informant  allopurinol  (ZYLOPRIM ) 300 MG tablet [id number] No Take 300 mg by mouth daily. [provider] Taking Active Self  aspirin  EC 81 MG EC tablet [id number] No Take 1 tablet (81 mg total) by mouth daily. Kraig Peru, MD Taking Active Self  Cholecalciferol  50 MCG (2000 UT) CAPS [id number] No Take 2,000 Units by mouth daily. [provider] Taking Active Self  Cyanocobalamin (VITAMIN B-12 PO) [id number] No Take 1 tablet by mouth daily. [provider] Taking Active Self  dapagliflozin propanediol (FARXIGA) 10 MG TABS tablet [id number] No Take 10 mg by mouth daily. [provider] Taking Active   diclofenac Sodium (VOLTAREN) 1 % GEL [id number] No Apply 1  application topically as needed for pain (pain). [provider] Taking Active   disopyramide  (NORPACE  CR) 100 MG 12 hr capsule [id number] No Take 200 mg by mouth every 12 (twelve) hours.  Patient taking differently: Take 100 mg by mouth 3 (three) times daily.   [provider] Taking Active   ferrous sulfate  325 (65 FE) MG EC tablet [id number] No Take 1 tablet by mouth daily. [provider] Taking Active Self  fluticasone  furoate-vilanterol (BREO ELLIPTA ) 100-25 MCG/ACT AEPB [id number] No Inhale 1 puff into the lungs daily. [provider] Taking Active   furosemide  (LASIX ) 20 MG tablet [id number] No Take 1 tablet by mouth daily. [provider] Taking Active   Garlic 1000 MG CAPS [id number] No Take 1,000 mg by mouth daily. [provider] Taking Active Self           Med Note Suszanne Eriksson, Koleen Perna   Fri Jul 28, 2016 10:30 AM)    hydrochlorothiazide (MICROZIDE) 12.5 MG capsule [id number] No Take 12.5 mg by mouth daily.  Patient taking differently: Take 25 mg by mouth daily.   [provider] Taking Active   ipratropium-albuterol  (DUONEB) 0.5-2.5 (3) MG/3ML SOLN [id number] No Inhale 3 mLs into the lungs every 4 (four) hours as needed for wheezing (wheezing). Cozetta Divers, MD Taking Active Self           Med Note Purnell Bruch   Tue Dec 24, 2018  9:53 AM)    JANUVIA 25 MG tablet [id number] No Take 25 mg by mouth daily. [provider] Taking Active   Lactobacillus [facility] WOMEN) CAPS [id number] No Take 1 capsule by mouth daily as needed. [provider] Taking Active   LANTUS  SOLOSTAR 100 UNIT/ML Solostar Pen [id number] No Inject  15 Units into the skin at bedtime. [provider] Taking Active Self  metoprolol  succinate (TOPROL -XL) 50 MG 24 hr tablet [id number] No Take 1 tablet (50 mg total) by mouth daily. Revankar, Micael Adas, MD Taking Active   montelukast  (SINGULAIR ) 10 MG tablet [id number] No Take 1 tablet (10 mg  total) by mouth at bedtime. Lowne [name], Yvonne R, DO Taking Active   Multiple Vitamin (MULTIVITAMIN WITH MINERALS) TABS tablet [id number] No Take 1 tablet by mouth daily. [provider] Taking Active Self  nitroGLYCERIN  (NITROSTAT ) 0.4 MG SL tablet [id number] No Place 0.4 mg under the tongue every 5 (five) minutes as needed. [provider] Taking Active   Omega-3 Fatty Acids (FISH OIL) 1000 MG CAPS [id number] No Take 1,000 mg by mouth 2 (two) times daily.  [provider] Taking Active Self  OXYGEN  [id number] No Inhale 2 L into the lungs continuous. [provider] Taking Active Self  polyethylene glycol (MIRALAX  / GLYCOLAX ) 17 g packet [id number] No Take 17 g by mouth as needed for mild constipation. [provider] Taking Active   Probiotic Product (PROBIOTIC PO) [id number] No Take 1 tablet by mouth daily in the afternoon. [provider] Taking Active   rosuvastatin  (CRESTOR ) 10 MG tablet [id number] No Take 1 tablet (10 mg total) by mouth daily. Estill Hemming, DO Taking Active   sertraline  (ZOLOFT ) 100 MG tablet [id number] No Take 1 tablet (100 mg total) by mouth daily. Roel Clarity R, DO Taking Active   spironolactone (ALDACTONE) 25 MG tablet [id number] No Take 25 mg by mouth every Monday, Wednesday, and Friday. [provider] Taking Active   Suvorexant  (BELSOMRA ) 20 MG TABS [id number] No Take 1 tablet (20 mg total) by mouth at bedtime as needed. Estill Hemming, DO Taking Active   Turmeric 500 MG CAPS [id number] No Take 1 capsule by mouth daily. [provider] Taking Active Self  VENTOLIN  HFA 108 (90 Base) MCG/ACT inhaler [id number] No Inhale 2 puffs into the lungs every 6 (six) hours as needed for wheezing or shortness of breath.  [provider] Taking Active Self            Home Care and Equipment/Supplies: Were Home Health Services Ordered?: Yes Name of Home Health Agency:: [facility] Has  Agency set up a time to come to your home?: No Any new equipment or medical supplies ordered?: NA  Functional Questionnaire: Do you need assistance with bathing/showering or dressing?: Yes Do you need assistance with meal preparation?: Yes Do you need assistance with eating?: No Do you have difficulty maintaining continence: No Do you need assistance with getting out of bed/getting out of a chair/moving?: No Do you have difficulty managing or taking your medications?: No  Follow up appointments reviewed: PCP Follow-up appointment confirmed?: Yes Date of PCP follow-up appointment?: 05/10/23 Follow-up Provider: [facility] Follow-up appointment confirmed?: Yes Date of Specialist follow-up appointment?: 05/02/23 Follow-Up Specialty Provider:: cardio Do you need transportation to your follow-up appointment?: No Do you understand care options if your condition(s) worsen?: Yes-patient verbalized understanding    SIGNATURE Darrall Ellison, LPN [facility] Nurse Health Advisor Direct Dial [phone]

## 2023-05-10 ENCOUNTER — Ambulatory Visit: Admitting: Family Medicine

## 2023-05-10 DIAGNOSIS — N189 Chronic kidney disease, unspecified: Secondary | ICD-10-CM | POA: Diagnosis not present

## 2023-05-10 DIAGNOSIS — J45909 Unspecified asthma, uncomplicated: Secondary | ICD-10-CM | POA: Diagnosis not present

## 2023-05-10 DIAGNOSIS — I517 Cardiomegaly: Secondary | ICD-10-CM | POA: Diagnosis not present

## 2023-05-10 DIAGNOSIS — I2781 Cor pulmonale (chronic): Secondary | ICD-10-CM | POA: Diagnosis not present

## 2023-05-11 ENCOUNTER — Ambulatory Visit (INDEPENDENT_AMBULATORY_CARE_PROVIDER_SITE_OTHER): Admitting: Family Medicine

## 2023-05-11 ENCOUNTER — Encounter: Payer: Self-pay | Admitting: Family Medicine

## 2023-05-11 VITALS — BP 140/88 | HR 57 | Temp 98.1°F | Resp 18 | Ht 63.0 in | Wt 281.5 lb

## 2023-05-11 DIAGNOSIS — J9602 Acute respiratory failure with hypercapnia: Secondary | ICD-10-CM

## 2023-05-11 DIAGNOSIS — I5032 Chronic diastolic (congestive) heart failure: Secondary | ICD-10-CM | POA: Diagnosis not present

## 2023-05-11 DIAGNOSIS — N1832 Chronic kidney disease, stage 3b: Secondary | ICD-10-CM | POA: Diagnosis not present

## 2023-05-11 DIAGNOSIS — J9611 Chronic respiratory failure with hypoxia: Secondary | ICD-10-CM | POA: Diagnosis not present

## 2023-05-11 DIAGNOSIS — I5023 Acute on chronic systolic (congestive) heart failure: Secondary | ICD-10-CM

## 2023-05-11 LAB — COMPREHENSIVE METABOLIC PANEL WITH GFR
ALT: 18 U/L (ref 0–35)
AST: 16 U/L (ref 0–37)
Albumin: 4.1 g/dL (ref 3.5–5.2)
Alkaline Phosphatase: 56 U/L (ref 39–117)
BUN: 31 mg/dL — ABNORMAL HIGH (ref 6–23)
CO2: 39 meq/L — ABNORMAL HIGH (ref 19–32)
Calcium: 9 mg/dL (ref 8.4–10.5)
Chloride: 97 meq/L (ref 96–112)
Creatinine, Ser: 1.27 mg/dL — ABNORMAL HIGH (ref 0.40–1.20)
GFR: 41.66 mL/min — ABNORMAL LOW (ref >60.00)
Glucose, Bld: 82 mg/dL (ref 70–99)
Potassium: 3.8 meq/L (ref 3.5–5.1)
Sodium: 144 meq/L (ref 135–145)
Total Bilirubin: 0.5 mg/dL (ref 0.2–1.2)
Total Protein: 6 g/dL (ref 6.0–8.3)

## 2023-05-11 MED ORDER — GABAPENTIN 100 MG PO CAPS: ORAL_CAPSULE | ORAL | 1 refills | Status: DC

## 2023-05-11 NOTE — Progress Notes (Signed)
 Established Patient Office Visit  Subjective   Patient ID: Morgan Fischer, female    DOB: 13-Mar-1950  Age: 73 y.o. MRN: [id number]  Chief Complaint  Patient presents with   Hospitalization Follow-up    HPI Discussed the use of AI scribe software for clinical note transcription with the patient, who gave verbal consent to proceed.  History of Present Illness Morgan Fischer is a 74 year old female with COPD and hypercapnia who presents with respiratory issues and fluid retention.  She has been experiencing respiratory issues related to her COPD and hypercapnia, including episodes of feeling unwell, dyspnea, and inability to ambulate. Her friend noted cyanosis, indicating severe respiratory distress. Pulse oximeter readings at home were as low as 73, even while on oxygen  therapy. She was admitted to the hospital on the first of the month and discharged on the sixteenth, followed by a two-week stay at a skilled nursing facility before returning home on May first.  She uses a BiPAP machine at home every night and is on two liters of oxygen . During her hospital stay, her oxygen  levels sometimes required an increase to three liters. She has a history of acute and chronic respiratory failure with hypercapnia and was recently seen by a pulmonologist who performed a chest x-ray.  She has a history of several chronic conditions including atherosclerosis, chronic kidney disease stage three, gout, high cholesterol, iron deficiency, peripheral vascular disease, type two diabetes, anxiety, osteoarthritis, heart failure, aortic stenosis, and hypertrophic cardiomyopathy. She is currently taking multiple medications including Norpace  100 mg three times a day for CHF, gabapentin 100 mg two tablets at bedtime, and furosemide  20 mg. She was switched from hydrochlorothiazide to furosemide  and spironolactone.  She reports persistent and severe edema in her legs, causing discomfort. She takes two Lasix  pills to manage  the swelling. Her nephrologist is aware of her recent hospitalization and she has a follow-up appointment scheduled for the twenty-seventh of this month.  Her friend expressed concern about her reluctance to seek medical attention during severe episodes, noting that her pulse oximeter readings should not fall below 90. She acknowledges that her normal pulse ox is in the high 90s.   Patient Active Problem List   Diagnosis Date Noted   History of gout 04/05/2023   Need for pneumococcal 20-valent conjugate vaccination 11/23/2022   Need for influenza vaccination 11/23/2022   Insomnia 11/23/2022   Hyperlipidemia associated with type 2 diabetes mellitus (HCC) 11/23/2022   Chronic kidney disease, stage 3b (HCC) 02/04/2021   Long term (current) use of insulin  (HCC) 02/04/2021   Mild intermittent asthma 02/04/2021   Hypertrophic cardiomyopathy (HCC) 04/12/2020   Type 2 diabetes mellitus without complication, without long-term current use of insulin  (HCC) 04/12/2020   Anxiety 03/15/2020   Cardiomyopathy (HCC) 03/15/2020   Iron deficiency 03/15/2020   Osteoarthritis of knee 03/15/2020   Peripheral venous insufficiency 03/15/2020   Hypertensive heart disease without congestive heart failure 03/15/2020   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   Hyperlipidemia, unspecified 03/15/2020   Mixed hyperlipidemia 03/15/2020   Type 2 diabetes mellitus without complications (HCC) 03/15/2020   Acute respiratory failure (HCC) 03/08/2020   COPD with acute exacerbation (HCC) 03/08/2020   Acute on chronic respiratory failure with hypoxia and hypercapnia (HCC) 03/08/2020   Acute on chronic diastolic CHF (congestive heart failure) (HCC) 03/08/2020   Obesity hypoventilation syndrome (HCC) 03/08/2020   Arthritis    Diabetes mellitus without complication (HCC)    High cholesterol    Acute diastolic heart failure (HCC)  07/17/2019   Chronic hypoxemic respiratory failure (HCC)    CKD (chronic kidney disease)    Chest  pain 07/14/2019   Aortic stenosis 11/01/2018   Heart block AV complete (HCC)    Acute on chronic respiratory failure with hypoxia (HCC) 10/20/2018   HOCM (hypertrophic obstructive cardiomyopathy) (HCC) 09/18/2017   Pure hypercholesterolemia 02/13/2017   CHF (congestive heart failure) (HCC) 12/22/2016   Type 2 diabetes mellitus with stage 3 chronic kidney disease, with long-term current use of insulin  (HCC) 11/19/2016   Essential hypertension 11/19/2016   Gout 11/19/2016   Obstructive sleep apnea 11/19/2016   Essential (primary) hypertension 11/19/2016   Acute hypercapnic respiratory failure (HCC) 07/21/2016   Drug allergy 07/21/2016   Morbid obesity with BMI of 60.0-69.9, adult (HCC) 07/21/2016   Acute on chronic congestive heart failure (HCC) 07/19/2016   Acute respiratory acidosis (HCC) 07/19/2016   Acute respiratory distress 07/19/2016   Type 2 diabetes mellitus with hyperglycemia (HCC) 07/19/2016   Leukocytosis 07/19/2016   Chronic diastolic heart failure (HCC) 03/17/2016   Hypertensive heart disease with heart failure (HCC) 03/17/2016   Hypoxia 03/17/2016   Atherosclerosis of native coronary artery of native heart without angina pectoris 03/17/2016   Nonrheumatic aortic valve stenosis 03/17/2016   Troponin level elevated 08/24/2015   SOB (shortness of breath) 08/24/2015   Hypokalemia 08/24/2015   Morbid obesity (HCC)    Hypertension    Asthma    Hypomagnesemia    Panniculitis 08/23/2015   Past Medical History:  Diagnosis Date   Acute diastolic heart failure (HCC) 07/17/2019   Acute hypercapnic respiratory failure (HCC) 07/21/2016   Acute on chronic congestive heart failure (HCC) 07/19/2016   Acute on chronic diastolic CHF (congestive heart failure) (HCC) 03/08/2020   Acute on chronic respiratory failure with hypoxia (HCC) 10/20/2018   Acute on chronic respiratory failure with hypoxia and hypercapnia (HCC) 03/08/2020   Acute respiratory acidosis (HCC) 07/19/2016   Acute  respiratory distress 07/19/2016   Acute respiratory failure (HCC) 03/08/2020   Anxiety 03/15/2020   Aortic stenosis 11/01/2018   Arthritis    Asthma    Atherosclerosis of native coronary artery of native heart without angina pectoris 03/17/2016   Cardiomyopathy (HCC) 03/15/2020   Chest pain 07/14/2019   CHF (congestive heart failure) (HCC)    Chronic diastolic heart failure (HCC) 03/17/2016   Chronic hypoxemic respiratory failure (HCC)    Chronic kidney disease, stage 3b (HCC) 02/04/2021   CKD (chronic kidney disease)    COPD (chronic obstructive pulmonary disease) (HCC)    COPD with acute exacerbation (HCC) 03/08/2020   Diabetes mellitus without complication (HCC)    Drug allergy 07/21/2016   Essential (primary) hypertension 11/19/2016   Essential hypertension 11/19/2016   Gout 11/19/2016   Heart block AV complete (HCC)    High cholesterol    History of gout 04/05/2023   HOCM (hypertrophic obstructive cardiomyopathy) (HCC) 09/18/2017   Hyperlipidemia associated with type 2 diabetes mellitus (HCC) 11/23/2022   Hyperlipidemia, unspecified 03/15/2020   Hypertension    Hypertensive heart disease with heart failure (HCC) 03/17/2016   Hypertensive heart disease without congestive heart failure 03/15/2020   Hypertrophic cardiomyopathy (HCC) 04/12/2020   Hypokalemia 08/24/2015   Hypomagnesemia    Hypoxia 03/17/2016   Insomnia 11/23/2022   Iron deficiency 03/15/2020   Leukocytosis 07/19/2016   Long term (current) use of insulin  (HCC) 02/04/2021   Mild intermittent asthma 02/04/2021   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   Mixed hyperlipidemia 03/15/2020   Morbid obesity (HCC)  Morbid obesity with BMI of 60.0-69.9, adult (HCC) 07/21/2016   Need for influenza vaccination 11/23/2022   Need for pneumococcal 20-valent conjugate vaccination 11/23/2022   Nonrheumatic aortic valve stenosis 03/17/2016   Obesity hypoventilation syndrome (HCC) 03/08/2020   Obstructive sleep apnea 11/19/2016    Osteoarthritis of knee 03/15/2020   Panniculitis 08/23/2015   Peripheral venous insufficiency 03/15/2020   Pure hypercholesterolemia 02/13/2017   SOB (shortness of breath) 08/24/2015   Troponin level elevated 08/24/2015   Type 2 diabetes mellitus with hyperglycemia (HCC) 07/19/2016   Type 2 diabetes mellitus with stage 3 chronic kidney disease, with long-term current use of insulin  (HCC) 11/19/2016   Type 2 diabetes mellitus without complication, without long-term current use of insulin  (HCC) 04/12/2020   Type 2 diabetes mellitus without complications (HCC) 03/15/2020   Type 2 diabetes mellitus, without long-term current use of insulin  (HCC) 11/19/2016   Past Surgical History:  Procedure Laterality Date   ABDOMINAL HYSTERECTOMY     cellulitis debridement  2000   abdomen   CESAREAN SECTION     Social History   Tobacco Use   Smoking status: Never   Smokeless tobacco: Never  Vaping Use   Vaping status: Never Used  Substance Use Topics   Alcohol use: No   Drug use: No   Social History   Socioeconomic History   Marital status: Widowed    Spouse name: Not on file   Number of children: Not on file   Years of education: Not on file   Highest education level: Bachelor's degree (e.g., BA, AB, BS)  Occupational History   Occupation: retired    Comment: Copywriter, 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  Tobacco Use   Smoking status: Never   Smokeless tobacco: Never  Vaping Use   Vaping status: Never Used  Substance and Sexual Activity   Alcohol use: No   Drug use: No   Sexual activity: Never  Other Topics Concern   Not on file  Social History Narrative   Not on file   Social Drivers of Health   Financial Resource Strain: Patient Declined (02/13/2023)   Overall Financial Resource Strain (CARDIA)    Difficulty of Paying Living Expenses: Patient declined  Food Insecurity: No Food Insecurity (02/13/2023)   Hunger Vital Sign    Worried About Running Out of Food in the  Last Year: Never true    Ran Out of Food in the Last Year: Never true  Transportation Needs: No Transportation Needs (02/13/2023)   PRAPARE - Administrator, Civil Service (Medical): No    Lack of Transportation (Non-Medical): No  Physical Activity: Unknown (02/13/2023)   Exercise Vital Sign    Days of Exercise per Week: 0 days    Minutes of Exercise per Session: Not on file  Stress: No Stress Concern Present (02/13/2023)   [employer] of Occupational Health - Occupational Stress Questionnaire    Feeling of Stress : Only a little  Social Connections: Moderately Integrated (02/13/2023)   Social Connection and Isolation Panel [NHANES]    Frequency of Communication with Friends and Family: More than three times a week    Frequency of Social Gatherings with Friends and Family: More than three times a week    Attends Religious Services: More than 4 times per year    Active Member of [employer] or Organizations: Yes    Attends Banker Meetings: Patient declined    Marital Status: Widowed  Intimate Partner Violence: Not At Risk (02/13/2023)  Humiliation, Afraid, Rape, and Kick questionnaire    Fear of Current or Ex-Partner: No    Emotionally Abused: No    Physically Abused: No    Sexually Abused: No   Family Status  Relation Name Status   Mother  Deceased   Father  Deceased   Sister  Alive   Sister  Alive   Sister  Deceased at age 95   Brother  Alive   Brother  Alive   Brother  Alive   Brother  Alive   Brother  Alive   Brother  Deceased at age 68  No partnership data on file   Family History  Problem Relation Age of Onset   Diabetes Mellitus I Mother    CAD Mother    Diabetes Mother    Heart disease Father    Cancer Father    Breast cancer Sister    Cancer Sister    Heart disease Sister    Prostate cancer Brother    Allergies  Allergen Reactions   Atorvastatin Anaphylaxis and Swelling    Lip swelling    Ciprofloxacin Anaphylaxis    Kidney  failure   Ciprocinonide [Fluocinolone] Other (See Comments)    Kidney failure   Catapres [Clonidine Hcl] Rash   Clonidine Rash   Demadex [Torsemide] Rash      Review of Systems  Constitutional:  Negative for fever and malaise/fatigue.  HENT:  Negative for congestion.   Eyes:  Negative for blurred vision.  Respiratory:  Negative for shortness of breath.   Cardiovascular:  Negative for chest pain, palpitations and leg swelling.  Gastrointestinal:  Negative for abdominal pain, blood in stool and nausea.  Genitourinary:  Negative for dysuria and frequency.  Musculoskeletal:  Negative for falls.  Skin:  Negative for rash.  Neurological:  Negative for dizziness, loss of consciousness and headaches.  Endo/Heme/Allergies:  Negative for environmental allergies.  Psychiatric/Behavioral:  Negative for depression. The patient is not nervous/anxious.       Objective:     BP (!) 140/88 (BP Location: Left Arm, Patient Position: Sitting, Cuff Size: Large)   Pulse (!) 57   Temp 98.1 F (36.7 C) (Oral)   Resp 18   Ht 5\' 3"  (1.6 m)   Wt 281 lb 8 oz (127.7 kg) Comment: per patient  LMP  (LMP Unknown)   SpO2 97%   BMI 49.87 kg/m  BP Readings from Last 3 Encounters:  05/11/23 (!) 140/88  05/02/23 (!) 152/74  02/27/23 120/60   Wt Readings from Last 3 Encounters:  05/11/23 281 lb 8 oz (127.7 kg)  05/02/23 (!) 302 lb 6.4 oz (137.2 kg)  02/27/23 281 lb 11.2 oz (127.8 kg)   SpO2 Readings from Last 3 Encounters:  05/11/23 97%  05/02/23 97%  02/27/23 98%    Physical Exam Vitals and nursing note reviewed.  Constitutional:      General: She is not in acute distress.    Appearance: Normal appearance. She is well-developed.  HENT:     Head: Normocephalic and atraumatic.  Eyes:     General: No scleral icterus.       Right eye: No discharge.        Left eye: No discharge.  Cardiovascular:     Rate and Rhythm: Normal rate and regular rhythm.     Heart sounds: No murmur  heard. Pulmonary:     Effort: Pulmonary effort is normal. No respiratory distress.     Breath sounds: Decreased breath sounds present.  Abdominal:  General: Abdomen is flat. Bowel sounds are normal. There is no distension.     Palpations: Abdomen is soft.     Tenderness: There is no abdominal tenderness. There is no guarding or rebound.  Musculoskeletal:        General: Normal range of motion.     Cervical back: Normal range of motion and neck supple.     Right lower leg: No edema.     Left lower leg: No edema.  Skin:    General: Skin is warm and dry.  Neurological:     Mental Status: She is alert and oriented to person, place, and time.  Psychiatric:        Mood and Affect: Mood normal.        Behavior: Behavior normal.        Thought Content: Thought content normal.        Judgment: Judgment normal.      No results found for any visits on 05/11/23.  Last CBC Lab Results  Component Value Date   WBC 7.7 11/23/2022   HGB 11.3 (L) 11/23/2022   HCT 36.4 11/23/2022   MCV 89.9 11/23/2022   MCH 26.9 03/12/2020   RDW 17.4 (H) 11/23/2022   PLT 148.0 (L) 11/23/2022   Last metabolic panel Lab Results  Component Value Date   GLUCOSE 86 02/27/2023   NA 143 02/27/2023   K 4.1 02/27/2023   CL 98 02/27/2023   CO2 37 (H) 02/27/2023   BUN 38 (H) 02/27/2023   CREATININE 1.29 (H) 02/27/2023   GFR 40.94 (L) 02/27/2023   CALCIUM  9.1 02/27/2023   PHOS 3.1 03/10/2020   PROT 6.6 02/27/2023   ALBUMIN 4.3 02/27/2023   LABGLOB 2.4 09/28/2022   BILITOT 0.5 02/27/2023   ALKPHOS 60 02/27/2023   AST 13 02/27/2023   ALT 12 02/27/2023   ANIONGAP 12 03/12/2020   Last lipids Lab Results  Component Value Date   CHOL 153 02/27/2023   HDL 37.30 (L) 02/27/2023   LDLCALC 94 02/27/2023   TRIG 106.0 02/27/2023   CHOLHDL 4 02/27/2023   Last hemoglobin A1c Lab Results  Component Value Date   HGBA1C 8.1 (H) 11/23/2022   Last thyroid  functions Lab Results  Component Value Date    TSH 0.662 03/09/2020   Last vitamin D  No results found for: "25OHVITD2", "25OHVITD3", "VD25OH" Last vitamin B12 and Folate Lab Results  Component Value Date   VITAMINB12 1,292 (H) 03/08/2020   FOLATE 36.5 03/08/2020      The ASCVD Risk score (Arnett DK, et al., 2019) failed to calculate for the following reasons:   Risk score cannot be calculated because patient has a medical history suggesting prior/existing ASCVD    Assessment & Plan:   Problem List Items Addressed This Visit       Unprioritized   Chronic hypoxemic respiratory failure (HCC)   Chronic kidney disease, stage 3b (HCC)   Relevant Orders   Comprehensive metabolic panel with GFR   Chronic diastolic heart failure (HCC) - Primary   Relevant Medications   disopyramide  (NORPACE ) 100 MG capsule   Other Relevant Orders   Comprehensive metabolic panel with GFR   Acute on chronic congestive heart failure (HCC)   Check cmp and pt willl take 2 lasix  today due to swelling  Elevated legs  Compression socks / stockings F/u 3 months  Pt has f/u nephrology later this month      Relevant Medications   disopyramide  (NORPACE ) 100 MG capsule   Acute  hypercapnic respiratory failure (HCC)   Pt doing much better on 2 L o2  Po at home 95-%     Assessment and Plan Assessment & Plan Chronic obstructive pulmonary disease (COPD)   COPD recently exacerbated, leading to hospitalization. She uses BiPAP at home and is on 2 liters of oxygen . Pulse oximetry readings have been as low as 73% with oxygen , indicating severe hypoxemia. A recent pulmonologist follow-up included a chest x-ray, with results pending. Continue BiPAP therapy and maintain oxygen  at 2 liters per minute. Monitor pulse oximetry at home; seek emergency care if below 93%. Follow up with the pulmonologist in 6 weeks.  Acute and chronic respiratory failure with hypercapnia   She was recently hospitalized for acute and chronic respiratory failure with hypercapnia,  experiencing fluid retention and requiring BiPAP therapy. Current management includes oxygen  therapy and monitoring of respiratory status.  Heart failure   Heart failure recently exacerbated with significant leg swelling. Current medications include furosemide  and spironolactone. The pulmonologist advised taking two doses of furosemide  today to manage swelling. Monitor leg swelling and report any worsening symptoms. Check kidney function with blood work today. Follow up with the nephrologist on the 27th of this month.  Chronic kidney disease, stage 3   Chronic kidney disease stage 3 requires ongoing coordination with the nephrologist. Monitoring kidney function is essential, especially with diuretic use.  Aortic stenosis   Aortic stenosis with no recent changes in management.  Hypertrophic cardiomyopathy   Hypertrophic cardiomyopathy with no recent changes in management.  Type 2 diabetes mellitus   Type 2 diabetes mellitus managed with 26 units of Lantus  insulin . Continue Lantus  insulin  at 26 units.  Peripheral vascular disease   Peripheral vascular disease with no recent changes in management.  Iron deficiency anemia   Iron deficiency anemia noted in recent hospitalization records.  Osteoarthritis   Osteoarthritis with no recent changes in management.  Gout   Gout with no recent changes in management.  Anxiety   Anxiety with no recent changes in management.  Follow-up   Follow-up appointments are scheduled with various specialists. Regular follow-up with the primary care provider is planned. Follow up with the pulmonologist in 6 weeks, the nephrologist on the 27th of this month, and schedule a follow-up with the primary care provider in 3 months.    No follow-ups on file.    Seaborn Nakama R Lowne [name], DO

## 2023-05-11 NOTE — Assessment & Plan Note (Signed)
 Pt doing much better on 2 L o2  Po at home 95-%

## 2023-05-11 NOTE — Assessment & Plan Note (Signed)
 Check cmp and pt willl take 2 lasix  today due to swelling  Elevated legs  Compression socks / stockings F/u 3 months  Pt has f/u nephrology later this month

## 2023-05-14 DIAGNOSIS — M356 Relapsing panniculitis [Weber-Christian]: Secondary | ICD-10-CM | POA: Diagnosis not present

## 2023-05-14 DIAGNOSIS — G4733 Obstructive sleep apnea (adult) (pediatric): Secondary | ICD-10-CM | POA: Diagnosis not present

## 2023-05-14 DIAGNOSIS — R269 Unspecified abnormalities of gait and mobility: Secondary | ICD-10-CM | POA: Diagnosis not present

## 2023-05-14 DIAGNOSIS — J449 Chronic obstructive pulmonary disease, unspecified: Secondary | ICD-10-CM | POA: Diagnosis not present

## 2023-05-14 DIAGNOSIS — J45909 Unspecified asthma, uncomplicated: Secondary | ICD-10-CM | POA: Diagnosis not present

## 2023-05-15 ENCOUNTER — Telehealth: Payer: Self-pay | Admitting: Family Medicine

## 2023-05-15 DIAGNOSIS — J9611 Chronic respiratory failure with hypoxia: Secondary | ICD-10-CM

## 2023-05-15 DIAGNOSIS — I5032 Chronic diastolic (congestive) heart failure: Secondary | ICD-10-CM

## 2023-05-15 DIAGNOSIS — N1832 Chronic kidney disease, stage 3b: Secondary | ICD-10-CM

## 2023-05-15 DIAGNOSIS — Z794 Long term (current) use of insulin: Secondary | ICD-10-CM

## 2023-05-15 NOTE — Telephone Encounter (Signed)
 Copied from CRM [id number]. Topic: Referral - Request for Referral >> May 15, 2023  4:42 PM Felizardo Hotter wrote: Did the patient discuss referral with their provider in the last year? No (If No - schedule appointment) (If Yes - send message)  Appointment offered? Yes  Type of order/referral and detailed reason for visit: [facility] ph: [phone] or  [facility] [phone]  Preference of office, provider, location: near pt  If referral order, have you been seen by this specialty before? No (If Yes, this issue or another issue? When? Where?  Can we respond through MyChart? No

## 2023-05-17 ENCOUNTER — Other Ambulatory Visit: Payer: Self-pay | Admitting: Family Medicine

## 2023-05-17 NOTE — Addendum Note (Signed)
 Addended by: Roel Clarity R on: 05/17/2023 04:46 PM   Modules accepted: Orders

## 2023-05-17 NOTE — Addendum Note (Signed)
 Addended by: Ysidro Her on: 05/17/2023 11:42 AM   Modules accepted: Orders

## 2023-05-17 NOTE — Telephone Encounter (Signed)
 Copied from CRM [id number]. Topic: Referral - Status >> May 17, 2023 10:15 AM [name] wrote: Morgan Fischer from [employer] called Tuesday and spoke with Morgan Fischer due to patient needing Home Health Care. Was told the patient would get a call about the referral.

## 2023-05-18 ENCOUNTER — Ambulatory Visit: Payer: Self-pay | Admitting: Family Medicine

## 2023-05-25 DIAGNOSIS — E1165 Type 2 diabetes mellitus with hyperglycemia: Secondary | ICD-10-CM | POA: Diagnosis not present

## 2023-05-25 DIAGNOSIS — Z794 Long term (current) use of insulin: Secondary | ICD-10-CM | POA: Diagnosis not present

## 2023-05-30 DIAGNOSIS — I10 Essential (primary) hypertension: Secondary | ICD-10-CM | POA: Diagnosis not present

## 2023-05-30 DIAGNOSIS — N183 Chronic kidney disease, stage 3 unspecified: Secondary | ICD-10-CM | POA: Diagnosis not present

## 2023-05-30 DIAGNOSIS — I2510 Atherosclerotic heart disease of native coronary artery without angina pectoris: Secondary | ICD-10-CM | POA: Diagnosis not present

## 2023-05-31 ENCOUNTER — Telehealth: Payer: Self-pay | Admitting: Family Medicine

## 2023-05-31 NOTE — Telephone Encounter (Signed)
 Copied from CRM [id number]. Topic: Referral - Status >> May 31, 2023  3:29 PM Morgan Fischer wrote: Reason for CRM: [facility] was calling checking the status of home health referral- as it still showing it's pending.

## 2023-05-31 NOTE — Telephone Encounter (Signed)
 Can you sign off on the order from 05/15 please

## 2023-06-01 NOTE — Addendum Note (Signed)
 Addended by: Ysidro Her on: 06/01/2023 01:20 PM   Modules accepted: Orders

## 2023-06-01 NOTE — Telephone Encounter (Signed)
New orders pended

## 2023-06-04 ENCOUNTER — Telehealth: Payer: Self-pay | Admitting: Family Medicine

## 2023-06-04 NOTE — Telephone Encounter (Signed)
 Copied from CRM [id number]. Topic: General - Other >> Jun 04, 2023  9:39 AM Caliyah H wrote: Reason for CRM: Niesha from [facility] called to inform Dr. Jalene Mayor that she is assisting with the [facility] referral for physical therapy and nursing. She stated she will be submitting another referral to [facility] and clinical information will be needed for 72 hour turn around since the patient has been waiting for a month.   Callback Number: [phone] ext: [phone]

## 2023-06-05 NOTE — Telephone Encounter (Signed)
 Noted

## 2023-06-07 ENCOUNTER — Telehealth: Payer: Self-pay

## 2023-06-07 NOTE — Telephone Encounter (Signed)
 FYI

## 2023-06-07 NOTE — Telephone Encounter (Signed)
 Copied from CRM [id number]. Topic: Referral - Request for Referral >> Jun 07, 2023 10:35 AM Marlan Silva wrote: Did the patient discuss referral with their provider in the last year? Yes (If No - schedule appointment) (If Yes - send message)  Appointment offered? No  Type of order/referral and detailed reason for visit: Referral is needed for [facility], [employer] called and stated that the prior auth has been approved.  Preference of office, provider, location:[facility]. P: [phone] Ext [phone] F: [phone]  If referral order, have you been seen by this specialty before? Yes (If Yes, this issue or another issue? When? Where?  Can we respond through MyChart? Yes

## 2023-06-10 DIAGNOSIS — I422 Other hypertrophic cardiomyopathy: Secondary | ICD-10-CM | POA: Diagnosis not present

## 2023-06-10 DIAGNOSIS — J4489 Other specified chronic obstructive pulmonary disease: Secondary | ICD-10-CM | POA: Diagnosis not present

## 2023-06-10 DIAGNOSIS — J4520 Mild intermittent asthma, uncomplicated: Secondary | ICD-10-CM | POA: Diagnosis not present

## 2023-06-10 DIAGNOSIS — M179 Osteoarthritis of knee, unspecified: Secondary | ICD-10-CM | POA: Diagnosis not present

## 2023-06-10 DIAGNOSIS — E782 Mixed hyperlipidemia: Secondary | ICD-10-CM | POA: Diagnosis not present

## 2023-06-10 DIAGNOSIS — E1122 Type 2 diabetes mellitus with diabetic chronic kidney disease: Secondary | ICD-10-CM | POA: Diagnosis not present

## 2023-06-10 DIAGNOSIS — N1832 Chronic kidney disease, stage 3b: Secondary | ICD-10-CM | POA: Diagnosis not present

## 2023-06-10 DIAGNOSIS — M109 Gout, unspecified: Secondary | ICD-10-CM | POA: Diagnosis not present

## 2023-06-10 DIAGNOSIS — G4700 Insomnia, unspecified: Secondary | ICD-10-CM | POA: Diagnosis not present

## 2023-06-10 DIAGNOSIS — J9621 Acute and chronic respiratory failure with hypoxia: Secondary | ICD-10-CM | POA: Diagnosis not present

## 2023-06-10 DIAGNOSIS — E1151 Type 2 diabetes mellitus with diabetic peripheral angiopathy without gangrene: Secondary | ICD-10-CM | POA: Diagnosis not present

## 2023-06-10 DIAGNOSIS — J9622 Acute and chronic respiratory failure with hypercapnia: Secondary | ICD-10-CM | POA: Diagnosis not present

## 2023-06-10 DIAGNOSIS — Z7982 Long term (current) use of aspirin: Secondary | ICD-10-CM | POA: Diagnosis not present

## 2023-06-10 DIAGNOSIS — Z794 Long term (current) use of insulin: Secondary | ICD-10-CM | POA: Diagnosis not present

## 2023-06-10 DIAGNOSIS — I130 Hypertensive heart and chronic kidney disease with heart failure and stage 1 through stage 4 chronic kidney disease, or unspecified chronic kidney disease: Secondary | ICD-10-CM | POA: Diagnosis not present

## 2023-06-10 DIAGNOSIS — I059 Rheumatic mitral valve disease, unspecified: Secondary | ICD-10-CM | POA: Diagnosis not present

## 2023-06-10 DIAGNOSIS — I442 Atrioventricular block, complete: Secondary | ICD-10-CM | POA: Diagnosis not present

## 2023-06-10 DIAGNOSIS — I5043 Acute on chronic combined systolic (congestive) and diastolic (congestive) heart failure: Secondary | ICD-10-CM | POA: Diagnosis not present

## 2023-06-10 DIAGNOSIS — I2510 Atherosclerotic heart disease of native coronary artery without angina pectoris: Secondary | ICD-10-CM | POA: Diagnosis not present

## 2023-06-10 DIAGNOSIS — E1169 Type 2 diabetes mellitus with other specified complication: Secondary | ICD-10-CM | POA: Diagnosis not present

## 2023-06-10 DIAGNOSIS — I872 Venous insufficiency (chronic) (peripheral): Secondary | ICD-10-CM | POA: Diagnosis not present

## 2023-06-10 DIAGNOSIS — I350 Nonrheumatic aortic (valve) stenosis: Secondary | ICD-10-CM | POA: Diagnosis not present

## 2023-06-11 ENCOUNTER — Telehealth: Payer: Self-pay

## 2023-06-11 NOTE — Telephone Encounter (Signed)
 VO given.

## 2023-06-11 NOTE — Telephone Encounter (Signed)
 Copied from CRM [id number]. Topic: Clinical - Home Health Verbal Orders >> Jun 11, 2023  8:38 AM Albertha Alosa wrote: Caller/Agency: Polly Brink. [facility]  Callback Number: [phone] Service Requested: Physical Therapy Frequency: 2 week 4 , 1 week 4  Any new concerns about the patient? No

## 2023-06-12 DIAGNOSIS — M109 Gout, unspecified: Secondary | ICD-10-CM | POA: Diagnosis not present

## 2023-06-12 DIAGNOSIS — J4520 Mild intermittent asthma, uncomplicated: Secondary | ICD-10-CM | POA: Diagnosis not present

## 2023-06-12 DIAGNOSIS — N1832 Chronic kidney disease, stage 3b: Secondary | ICD-10-CM | POA: Diagnosis not present

## 2023-06-12 DIAGNOSIS — E782 Mixed hyperlipidemia: Secondary | ICD-10-CM | POA: Diagnosis not present

## 2023-06-12 DIAGNOSIS — I872 Venous insufficiency (chronic) (peripheral): Secondary | ICD-10-CM | POA: Diagnosis not present

## 2023-06-12 DIAGNOSIS — J9621 Acute and chronic respiratory failure with hypoxia: Secondary | ICD-10-CM | POA: Diagnosis not present

## 2023-06-12 DIAGNOSIS — I059 Rheumatic mitral valve disease, unspecified: Secondary | ICD-10-CM | POA: Diagnosis not present

## 2023-06-12 DIAGNOSIS — I130 Hypertensive heart and chronic kidney disease with heart failure and stage 1 through stage 4 chronic kidney disease, or unspecified chronic kidney disease: Secondary | ICD-10-CM | POA: Diagnosis not present

## 2023-06-12 DIAGNOSIS — I350 Nonrheumatic aortic (valve) stenosis: Secondary | ICD-10-CM | POA: Diagnosis not present

## 2023-06-12 DIAGNOSIS — G4700 Insomnia, unspecified: Secondary | ICD-10-CM | POA: Diagnosis not present

## 2023-06-12 DIAGNOSIS — E1122 Type 2 diabetes mellitus with diabetic chronic kidney disease: Secondary | ICD-10-CM | POA: Diagnosis not present

## 2023-06-12 DIAGNOSIS — E1151 Type 2 diabetes mellitus with diabetic peripheral angiopathy without gangrene: Secondary | ICD-10-CM | POA: Diagnosis not present

## 2023-06-12 DIAGNOSIS — I2510 Atherosclerotic heart disease of native coronary artery without angina pectoris: Secondary | ICD-10-CM | POA: Diagnosis not present

## 2023-06-12 DIAGNOSIS — E1169 Type 2 diabetes mellitus with other specified complication: Secondary | ICD-10-CM | POA: Diagnosis not present

## 2023-06-12 DIAGNOSIS — J4489 Other specified chronic obstructive pulmonary disease: Secondary | ICD-10-CM | POA: Diagnosis not present

## 2023-06-12 DIAGNOSIS — M179 Osteoarthritis of knee, unspecified: Secondary | ICD-10-CM | POA: Diagnosis not present

## 2023-06-12 DIAGNOSIS — Z7982 Long term (current) use of aspirin: Secondary | ICD-10-CM | POA: Diagnosis not present

## 2023-06-12 DIAGNOSIS — Z794 Long term (current) use of insulin: Secondary | ICD-10-CM | POA: Diagnosis not present

## 2023-06-12 DIAGNOSIS — J9622 Acute and chronic respiratory failure with hypercapnia: Secondary | ICD-10-CM | POA: Diagnosis not present

## 2023-06-12 DIAGNOSIS — I442 Atrioventricular block, complete: Secondary | ICD-10-CM | POA: Diagnosis not present

## 2023-06-12 DIAGNOSIS — I5043 Acute on chronic combined systolic (congestive) and diastolic (congestive) heart failure: Secondary | ICD-10-CM | POA: Diagnosis not present

## 2023-06-12 DIAGNOSIS — I422 Other hypertrophic cardiomyopathy: Secondary | ICD-10-CM | POA: Diagnosis not present

## 2023-06-13 DIAGNOSIS — N183 Chronic kidney disease, stage 3 unspecified: Secondary | ICD-10-CM | POA: Diagnosis not present

## 2023-06-13 DIAGNOSIS — I10 Essential (primary) hypertension: Secondary | ICD-10-CM | POA: Diagnosis not present

## 2023-06-14 DIAGNOSIS — R269 Unspecified abnormalities of gait and mobility: Secondary | ICD-10-CM | POA: Diagnosis not present

## 2023-06-14 DIAGNOSIS — J45909 Unspecified asthma, uncomplicated: Secondary | ICD-10-CM | POA: Diagnosis not present

## 2023-06-14 DIAGNOSIS — J449 Chronic obstructive pulmonary disease, unspecified: Secondary | ICD-10-CM | POA: Diagnosis not present

## 2023-06-14 DIAGNOSIS — G4733 Obstructive sleep apnea (adult) (pediatric): Secondary | ICD-10-CM | POA: Diagnosis not present

## 2023-06-14 DIAGNOSIS — M356 Relapsing panniculitis [Weber-Christian]: Secondary | ICD-10-CM | POA: Diagnosis not present

## 2023-06-20 DIAGNOSIS — E1169 Type 2 diabetes mellitus with other specified complication: Secondary | ICD-10-CM | POA: Diagnosis not present

## 2023-06-20 DIAGNOSIS — I442 Atrioventricular block, complete: Secondary | ICD-10-CM | POA: Diagnosis not present

## 2023-06-20 DIAGNOSIS — I5043 Acute on chronic combined systolic (congestive) and diastolic (congestive) heart failure: Secondary | ICD-10-CM | POA: Diagnosis not present

## 2023-06-20 DIAGNOSIS — I130 Hypertensive heart and chronic kidney disease with heart failure and stage 1 through stage 4 chronic kidney disease, or unspecified chronic kidney disease: Secondary | ICD-10-CM | POA: Diagnosis not present

## 2023-06-20 DIAGNOSIS — G4700 Insomnia, unspecified: Secondary | ICD-10-CM | POA: Diagnosis not present

## 2023-06-20 DIAGNOSIS — M109 Gout, unspecified: Secondary | ICD-10-CM | POA: Diagnosis not present

## 2023-06-20 DIAGNOSIS — Z7982 Long term (current) use of aspirin: Secondary | ICD-10-CM | POA: Diagnosis not present

## 2023-06-20 DIAGNOSIS — N1832 Chronic kidney disease, stage 3b: Secondary | ICD-10-CM | POA: Diagnosis not present

## 2023-06-20 DIAGNOSIS — I2510 Atherosclerotic heart disease of native coronary artery without angina pectoris: Secondary | ICD-10-CM | POA: Diagnosis not present

## 2023-06-20 DIAGNOSIS — J4520 Mild intermittent asthma, uncomplicated: Secondary | ICD-10-CM | POA: Diagnosis not present

## 2023-06-20 DIAGNOSIS — I059 Rheumatic mitral valve disease, unspecified: Secondary | ICD-10-CM | POA: Diagnosis not present

## 2023-06-20 DIAGNOSIS — I422 Other hypertrophic cardiomyopathy: Secondary | ICD-10-CM | POA: Diagnosis not present

## 2023-06-20 DIAGNOSIS — J4489 Other specified chronic obstructive pulmonary disease: Secondary | ICD-10-CM | POA: Diagnosis not present

## 2023-06-20 DIAGNOSIS — J9621 Acute and chronic respiratory failure with hypoxia: Secondary | ICD-10-CM | POA: Diagnosis not present

## 2023-06-20 DIAGNOSIS — J9622 Acute and chronic respiratory failure with hypercapnia: Secondary | ICD-10-CM | POA: Diagnosis not present

## 2023-06-20 DIAGNOSIS — E1151 Type 2 diabetes mellitus with diabetic peripheral angiopathy without gangrene: Secondary | ICD-10-CM | POA: Diagnosis not present

## 2023-06-20 DIAGNOSIS — I872 Venous insufficiency (chronic) (peripheral): Secondary | ICD-10-CM | POA: Diagnosis not present

## 2023-06-20 DIAGNOSIS — E1122 Type 2 diabetes mellitus with diabetic chronic kidney disease: Secondary | ICD-10-CM | POA: Diagnosis not present

## 2023-06-20 DIAGNOSIS — E782 Mixed hyperlipidemia: Secondary | ICD-10-CM | POA: Diagnosis not present

## 2023-06-20 DIAGNOSIS — Z794 Long term (current) use of insulin: Secondary | ICD-10-CM | POA: Diagnosis not present

## 2023-06-20 DIAGNOSIS — M179 Osteoarthritis of knee, unspecified: Secondary | ICD-10-CM | POA: Diagnosis not present

## 2023-06-20 DIAGNOSIS — I350 Nonrheumatic aortic (valve) stenosis: Secondary | ICD-10-CM | POA: Diagnosis not present

## 2023-06-21 DIAGNOSIS — J45909 Unspecified asthma, uncomplicated: Secondary | ICD-10-CM | POA: Diagnosis not present

## 2023-06-21 DIAGNOSIS — E119 Type 2 diabetes mellitus without complications: Secondary | ICD-10-CM | POA: Diagnosis not present

## 2023-06-21 DIAGNOSIS — I10 Essential (primary) hypertension: Secondary | ICD-10-CM | POA: Diagnosis not present

## 2023-06-21 DIAGNOSIS — I2781 Cor pulmonale (chronic): Secondary | ICD-10-CM | POA: Diagnosis not present

## 2023-06-25 DIAGNOSIS — M179 Osteoarthritis of knee, unspecified: Secondary | ICD-10-CM | POA: Diagnosis not present

## 2023-06-25 DIAGNOSIS — N1832 Chronic kidney disease, stage 3b: Secondary | ICD-10-CM | POA: Diagnosis not present

## 2023-06-25 DIAGNOSIS — I872 Venous insufficiency (chronic) (peripheral): Secondary | ICD-10-CM | POA: Diagnosis not present

## 2023-06-25 DIAGNOSIS — Z7982 Long term (current) use of aspirin: Secondary | ICD-10-CM | POA: Diagnosis not present

## 2023-06-25 DIAGNOSIS — I350 Nonrheumatic aortic (valve) stenosis: Secondary | ICD-10-CM | POA: Diagnosis not present

## 2023-06-25 DIAGNOSIS — J4489 Other specified chronic obstructive pulmonary disease: Secondary | ICD-10-CM | POA: Diagnosis not present

## 2023-06-25 DIAGNOSIS — I059 Rheumatic mitral valve disease, unspecified: Secondary | ICD-10-CM | POA: Diagnosis not present

## 2023-06-25 DIAGNOSIS — M109 Gout, unspecified: Secondary | ICD-10-CM | POA: Diagnosis not present

## 2023-06-25 DIAGNOSIS — I130 Hypertensive heart and chronic kidney disease with heart failure and stage 1 through stage 4 chronic kidney disease, or unspecified chronic kidney disease: Secondary | ICD-10-CM | POA: Diagnosis not present

## 2023-06-25 DIAGNOSIS — J4520 Mild intermittent asthma, uncomplicated: Secondary | ICD-10-CM | POA: Diagnosis not present

## 2023-06-25 DIAGNOSIS — J9621 Acute and chronic respiratory failure with hypoxia: Secondary | ICD-10-CM | POA: Diagnosis not present

## 2023-06-25 DIAGNOSIS — J9622 Acute and chronic respiratory failure with hypercapnia: Secondary | ICD-10-CM | POA: Diagnosis not present

## 2023-06-25 DIAGNOSIS — I422 Other hypertrophic cardiomyopathy: Secondary | ICD-10-CM | POA: Diagnosis not present

## 2023-06-25 DIAGNOSIS — I2510 Atherosclerotic heart disease of native coronary artery without angina pectoris: Secondary | ICD-10-CM | POA: Diagnosis not present

## 2023-06-25 DIAGNOSIS — E1122 Type 2 diabetes mellitus with diabetic chronic kidney disease: Secondary | ICD-10-CM | POA: Diagnosis not present

## 2023-06-25 DIAGNOSIS — E1169 Type 2 diabetes mellitus with other specified complication: Secondary | ICD-10-CM | POA: Diagnosis not present

## 2023-06-25 DIAGNOSIS — E1151 Type 2 diabetes mellitus with diabetic peripheral angiopathy without gangrene: Secondary | ICD-10-CM | POA: Diagnosis not present

## 2023-06-25 DIAGNOSIS — Z794 Long term (current) use of insulin: Secondary | ICD-10-CM | POA: Diagnosis not present

## 2023-06-25 DIAGNOSIS — I442 Atrioventricular block, complete: Secondary | ICD-10-CM | POA: Diagnosis not present

## 2023-06-25 DIAGNOSIS — E782 Mixed hyperlipidemia: Secondary | ICD-10-CM | POA: Diagnosis not present

## 2023-06-25 DIAGNOSIS — G4700 Insomnia, unspecified: Secondary | ICD-10-CM | POA: Diagnosis not present

## 2023-06-25 DIAGNOSIS — I5043 Acute on chronic combined systolic (congestive) and diastolic (congestive) heart failure: Secondary | ICD-10-CM | POA: Diagnosis not present

## 2023-06-26 DIAGNOSIS — Z794 Long term (current) use of insulin: Secondary | ICD-10-CM | POA: Diagnosis not present

## 2023-06-26 DIAGNOSIS — E1165 Type 2 diabetes mellitus with hyperglycemia: Secondary | ICD-10-CM | POA: Diagnosis not present

## 2023-06-26 DIAGNOSIS — E785 Hyperlipidemia, unspecified: Secondary | ICD-10-CM | POA: Diagnosis not present

## 2023-06-26 DIAGNOSIS — I10 Essential (primary) hypertension: Secondary | ICD-10-CM | POA: Diagnosis not present

## 2023-06-28 DIAGNOSIS — I130 Hypertensive heart and chronic kidney disease with heart failure and stage 1 through stage 4 chronic kidney disease, or unspecified chronic kidney disease: Secondary | ICD-10-CM | POA: Diagnosis not present

## 2023-06-28 DIAGNOSIS — I2510 Atherosclerotic heart disease of native coronary artery without angina pectoris: Secondary | ICD-10-CM | POA: Diagnosis not present

## 2023-06-28 DIAGNOSIS — J4520 Mild intermittent asthma, uncomplicated: Secondary | ICD-10-CM | POA: Diagnosis not present

## 2023-06-28 DIAGNOSIS — I442 Atrioventricular block, complete: Secondary | ICD-10-CM | POA: Diagnosis not present

## 2023-06-28 DIAGNOSIS — J9622 Acute and chronic respiratory failure with hypercapnia: Secondary | ICD-10-CM | POA: Diagnosis not present

## 2023-06-28 DIAGNOSIS — I350 Nonrheumatic aortic (valve) stenosis: Secondary | ICD-10-CM | POA: Diagnosis not present

## 2023-06-28 DIAGNOSIS — M109 Gout, unspecified: Secondary | ICD-10-CM | POA: Diagnosis not present

## 2023-06-28 DIAGNOSIS — I059 Rheumatic mitral valve disease, unspecified: Secondary | ICD-10-CM | POA: Diagnosis not present

## 2023-06-28 DIAGNOSIS — I872 Venous insufficiency (chronic) (peripheral): Secondary | ICD-10-CM | POA: Diagnosis not present

## 2023-06-28 DIAGNOSIS — E782 Mixed hyperlipidemia: Secondary | ICD-10-CM | POA: Diagnosis not present

## 2023-06-28 DIAGNOSIS — Z794 Long term (current) use of insulin: Secondary | ICD-10-CM | POA: Diagnosis not present

## 2023-06-28 DIAGNOSIS — N1832 Chronic kidney disease, stage 3b: Secondary | ICD-10-CM | POA: Diagnosis not present

## 2023-06-28 DIAGNOSIS — M179 Osteoarthritis of knee, unspecified: Secondary | ICD-10-CM | POA: Diagnosis not present

## 2023-06-28 DIAGNOSIS — Z7982 Long term (current) use of aspirin: Secondary | ICD-10-CM | POA: Diagnosis not present

## 2023-06-28 DIAGNOSIS — I5043 Acute on chronic combined systolic (congestive) and diastolic (congestive) heart failure: Secondary | ICD-10-CM | POA: Diagnosis not present

## 2023-06-28 DIAGNOSIS — J4489 Other specified chronic obstructive pulmonary disease: Secondary | ICD-10-CM | POA: Diagnosis not present

## 2023-06-28 DIAGNOSIS — E1122 Type 2 diabetes mellitus with diabetic chronic kidney disease: Secondary | ICD-10-CM | POA: Diagnosis not present

## 2023-06-28 DIAGNOSIS — E1151 Type 2 diabetes mellitus with diabetic peripheral angiopathy without gangrene: Secondary | ICD-10-CM | POA: Diagnosis not present

## 2023-06-28 DIAGNOSIS — J9621 Acute and chronic respiratory failure with hypoxia: Secondary | ICD-10-CM | POA: Diagnosis not present

## 2023-06-28 DIAGNOSIS — G4700 Insomnia, unspecified: Secondary | ICD-10-CM | POA: Diagnosis not present

## 2023-06-28 DIAGNOSIS — I422 Other hypertrophic cardiomyopathy: Secondary | ICD-10-CM | POA: Diagnosis not present

## 2023-06-28 DIAGNOSIS — E1169 Type 2 diabetes mellitus with other specified complication: Secondary | ICD-10-CM | POA: Diagnosis not present

## 2023-06-29 DIAGNOSIS — I10 Essential (primary) hypertension: Secondary | ICD-10-CM | POA: Diagnosis not present

## 2023-06-29 DIAGNOSIS — G4733 Obstructive sleep apnea (adult) (pediatric): Secondary | ICD-10-CM | POA: Diagnosis not present

## 2023-06-29 DIAGNOSIS — J449 Chronic obstructive pulmonary disease, unspecified: Secondary | ICD-10-CM | POA: Diagnosis not present

## 2023-07-03 ENCOUNTER — Telehealth: Payer: Self-pay

## 2023-07-03 DIAGNOSIS — I422 Other hypertrophic cardiomyopathy: Secondary | ICD-10-CM | POA: Diagnosis not present

## 2023-07-03 DIAGNOSIS — Z794 Long term (current) use of insulin: Secondary | ICD-10-CM | POA: Diagnosis not present

## 2023-07-03 DIAGNOSIS — I442 Atrioventricular block, complete: Secondary | ICD-10-CM | POA: Diagnosis not present

## 2023-07-03 DIAGNOSIS — J9621 Acute and chronic respiratory failure with hypoxia: Secondary | ICD-10-CM | POA: Diagnosis not present

## 2023-07-03 DIAGNOSIS — E1151 Type 2 diabetes mellitus with diabetic peripheral angiopathy without gangrene: Secondary | ICD-10-CM | POA: Diagnosis not present

## 2023-07-03 DIAGNOSIS — I130 Hypertensive heart and chronic kidney disease with heart failure and stage 1 through stage 4 chronic kidney disease, or unspecified chronic kidney disease: Secondary | ICD-10-CM | POA: Diagnosis not present

## 2023-07-03 DIAGNOSIS — M109 Gout, unspecified: Secondary | ICD-10-CM | POA: Diagnosis not present

## 2023-07-03 DIAGNOSIS — E1169 Type 2 diabetes mellitus with other specified complication: Secondary | ICD-10-CM | POA: Diagnosis not present

## 2023-07-03 DIAGNOSIS — I059 Rheumatic mitral valve disease, unspecified: Secondary | ICD-10-CM | POA: Diagnosis not present

## 2023-07-03 DIAGNOSIS — I2510 Atherosclerotic heart disease of native coronary artery without angina pectoris: Secondary | ICD-10-CM | POA: Diagnosis not present

## 2023-07-03 DIAGNOSIS — I872 Venous insufficiency (chronic) (peripheral): Secondary | ICD-10-CM | POA: Diagnosis not present

## 2023-07-03 DIAGNOSIS — I350 Nonrheumatic aortic (valve) stenosis: Secondary | ICD-10-CM | POA: Diagnosis not present

## 2023-07-03 DIAGNOSIS — N1832 Chronic kidney disease, stage 3b: Secondary | ICD-10-CM | POA: Diagnosis not present

## 2023-07-03 DIAGNOSIS — J4489 Other specified chronic obstructive pulmonary disease: Secondary | ICD-10-CM | POA: Diagnosis not present

## 2023-07-03 DIAGNOSIS — Z7982 Long term (current) use of aspirin: Secondary | ICD-10-CM | POA: Diagnosis not present

## 2023-07-03 DIAGNOSIS — E782 Mixed hyperlipidemia: Secondary | ICD-10-CM | POA: Diagnosis not present

## 2023-07-03 DIAGNOSIS — E1122 Type 2 diabetes mellitus with diabetic chronic kidney disease: Secondary | ICD-10-CM | POA: Diagnosis not present

## 2023-07-03 DIAGNOSIS — I5043 Acute on chronic combined systolic (congestive) and diastolic (congestive) heart failure: Secondary | ICD-10-CM | POA: Diagnosis not present

## 2023-07-03 DIAGNOSIS — J9622 Acute and chronic respiratory failure with hypercapnia: Secondary | ICD-10-CM | POA: Diagnosis not present

## 2023-07-03 DIAGNOSIS — M179 Osteoarthritis of knee, unspecified: Secondary | ICD-10-CM | POA: Diagnosis not present

## 2023-07-03 DIAGNOSIS — G4700 Insomnia, unspecified: Secondary | ICD-10-CM | POA: Diagnosis not present

## 2023-07-03 DIAGNOSIS — J4520 Mild intermittent asthma, uncomplicated: Secondary | ICD-10-CM | POA: Diagnosis not present

## 2023-07-03 NOTE — Telephone Encounter (Signed)
 Copied from CRM [id number]. Topic: Clinical - Home Health Verbal Orders >> Jul 03, 2023 10:52 AM Rea BROCKS wrote: Caller/Agency: Beth/[facility]  Callback Number: [phone] Any new concerns about the patient? Low oxygen - Patient is on two liters of oxygen . Patient walked 60 ft with PT and her oxygen  dropped to 85%. It took over 5 minutes for her oxygen  to come back to 93%. Patient lost two pounds since the 26th. Patient is not in any distress but is struggling with anxiety.

## 2023-07-04 DIAGNOSIS — G4733 Obstructive sleep apnea (adult) (pediatric): Secondary | ICD-10-CM | POA: Diagnosis not present

## 2023-07-04 DIAGNOSIS — G4730 Sleep apnea, unspecified: Secondary | ICD-10-CM | POA: Diagnosis not present

## 2023-07-04 DIAGNOSIS — I10 Essential (primary) hypertension: Secondary | ICD-10-CM | POA: Diagnosis not present

## 2023-07-04 DIAGNOSIS — J449 Chronic obstructive pulmonary disease, unspecified: Secondary | ICD-10-CM | POA: Diagnosis not present

## 2023-07-04 NOTE — Telephone Encounter (Signed)
 Pt unable to come in this week and didn't want to see a different provider. Pt scheduled for 7/8

## 2023-07-05 DIAGNOSIS — J9621 Acute and chronic respiratory failure with hypoxia: Secondary | ICD-10-CM | POA: Diagnosis not present

## 2023-07-05 DIAGNOSIS — I5043 Acute on chronic combined systolic (congestive) and diastolic (congestive) heart failure: Secondary | ICD-10-CM | POA: Diagnosis not present

## 2023-07-05 DIAGNOSIS — J9622 Acute and chronic respiratory failure with hypercapnia: Secondary | ICD-10-CM | POA: Diagnosis not present

## 2023-07-05 DIAGNOSIS — I059 Rheumatic mitral valve disease, unspecified: Secondary | ICD-10-CM | POA: Diagnosis not present

## 2023-07-05 DIAGNOSIS — J4489 Other specified chronic obstructive pulmonary disease: Secondary | ICD-10-CM | POA: Diagnosis not present

## 2023-07-05 DIAGNOSIS — G4700 Insomnia, unspecified: Secondary | ICD-10-CM | POA: Diagnosis not present

## 2023-07-05 DIAGNOSIS — I2510 Atherosclerotic heart disease of native coronary artery without angina pectoris: Secondary | ICD-10-CM | POA: Diagnosis not present

## 2023-07-05 DIAGNOSIS — J4520 Mild intermittent asthma, uncomplicated: Secondary | ICD-10-CM | POA: Diagnosis not present

## 2023-07-05 DIAGNOSIS — E1169 Type 2 diabetes mellitus with other specified complication: Secondary | ICD-10-CM | POA: Diagnosis not present

## 2023-07-05 DIAGNOSIS — E1151 Type 2 diabetes mellitus with diabetic peripheral angiopathy without gangrene: Secondary | ICD-10-CM | POA: Diagnosis not present

## 2023-07-05 DIAGNOSIS — M109 Gout, unspecified: Secondary | ICD-10-CM | POA: Diagnosis not present

## 2023-07-05 DIAGNOSIS — I442 Atrioventricular block, complete: Secondary | ICD-10-CM | POA: Diagnosis not present

## 2023-07-05 DIAGNOSIS — N1832 Chronic kidney disease, stage 3b: Secondary | ICD-10-CM | POA: Diagnosis not present

## 2023-07-05 DIAGNOSIS — I130 Hypertensive heart and chronic kidney disease with heart failure and stage 1 through stage 4 chronic kidney disease, or unspecified chronic kidney disease: Secondary | ICD-10-CM | POA: Diagnosis not present

## 2023-07-05 DIAGNOSIS — I872 Venous insufficiency (chronic) (peripheral): Secondary | ICD-10-CM | POA: Diagnosis not present

## 2023-07-05 DIAGNOSIS — Z7982 Long term (current) use of aspirin: Secondary | ICD-10-CM | POA: Diagnosis not present

## 2023-07-05 DIAGNOSIS — E782 Mixed hyperlipidemia: Secondary | ICD-10-CM | POA: Diagnosis not present

## 2023-07-05 DIAGNOSIS — E1122 Type 2 diabetes mellitus with diabetic chronic kidney disease: Secondary | ICD-10-CM | POA: Diagnosis not present

## 2023-07-05 DIAGNOSIS — M179 Osteoarthritis of knee, unspecified: Secondary | ICD-10-CM | POA: Diagnosis not present

## 2023-07-05 DIAGNOSIS — I350 Nonrheumatic aortic (valve) stenosis: Secondary | ICD-10-CM | POA: Diagnosis not present

## 2023-07-05 DIAGNOSIS — Z794 Long term (current) use of insulin: Secondary | ICD-10-CM | POA: Diagnosis not present

## 2023-07-05 DIAGNOSIS — I422 Other hypertrophic cardiomyopathy: Secondary | ICD-10-CM | POA: Diagnosis not present

## 2023-07-09 DIAGNOSIS — I10 Essential (primary) hypertension: Secondary | ICD-10-CM | POA: Diagnosis not present

## 2023-07-09 DIAGNOSIS — I422 Other hypertrophic cardiomyopathy: Secondary | ICD-10-CM | POA: Diagnosis not present

## 2023-07-10 ENCOUNTER — Ambulatory Visit (INDEPENDENT_AMBULATORY_CARE_PROVIDER_SITE_OTHER): Admitting: Family Medicine

## 2023-07-10 VITALS — BP 136/80 | HR 55 | Temp 97.9°F | Resp 18 | Ht 63.0 in | Wt 278.6 lb

## 2023-07-10 DIAGNOSIS — F419 Anxiety disorder, unspecified: Secondary | ICD-10-CM | POA: Diagnosis not present

## 2023-07-10 DIAGNOSIS — I5023 Acute on chronic systolic (congestive) heart failure: Secondary | ICD-10-CM

## 2023-07-10 DIAGNOSIS — R0602 Shortness of breath: Secondary | ICD-10-CM

## 2023-07-10 DIAGNOSIS — F418 Other specified anxiety disorders: Secondary | ICD-10-CM | POA: Insufficient documentation

## 2023-07-10 MED ORDER — SERTRALINE HCL 100 MG PO TABS: ORAL_TABLET | ORAL | 3 refills | Status: DC

## 2023-07-10 NOTE — Assessment & Plan Note (Signed)
 Increase zoloft  100 mg 1 1/2 tab every day  Refer to [facility] for counseling  F/u 1 month

## 2023-07-10 NOTE — Assessment & Plan Note (Signed)
 Refer for counseling  Inc zoloft  150 mg daily F/u 1 month

## 2023-07-10 NOTE — Patient Instructions (Signed)

## 2023-07-10 NOTE — Progress Notes (Signed)
 -  Established Patient Office Visit  Subjective   Patient ID: Morgan Fischer, female    DOB: April 03, 1950  Age: 73 y.o. MRN: [id number]  Chief Complaint  Patient presents with   Anxiety   Shortness of Breath    Pt states O2 is still dropping to 82 or 81. Pt is using 2L of 02 currently    HPI Discussed the use of AI scribe software for clinical note transcription with the patient, who gave verbal consent to proceed.  History of Present Illness Morgan Fischer is a 73 year old female with congestive heart failure who presents with shortness of breath and weight gain.  She experiences shortness of breath and difficulty breathing, with oxygen  saturation levels dropping to 81-83%. Her oxygen  supplementation was increased to 4 liters, but she feels no significant improvement. She recently consulted a pulmonologist to discuss when to seek emergency care for her symptoms.  She has noticed a weight gain from 270 to 278 pounds over the past couple of weeks, with fluctuations in her weight. Her weight was 280 pounds yesterday and 278 pounds today. She saw a cardiologist who changed her diuretic medication from hydrochlorothiazide to furosemide  40 mg and stopped spironolactone, which she was not taking. She experiences swelling in her legs, particularly in the ankles.  She experiences anxiety and is currently taking sertraline , which she feels helps but takes time to take effect. She has been taking it since March and feels more peaceful in the afternoon but becomes shaky later in the day. She is considering increasing her dose to one and a half tablets.  No congestion or coughing up anything. She is concerned about her oxygen  levels dropping and the impact of heat on her breathing. She uses a device to check her oxygen  levels at home.   Patient Active Problem List   Diagnosis Date Noted   Depression with anxiety 07/10/2023   History of gout 04/05/2023   Need for pneumococcal 20-valent conjugate  vaccination 11/23/2022   Need for influenza vaccination 11/23/2022   Insomnia 11/23/2022   Hyperlipidemia associated with type 2 diabetes mellitus (HCC) 11/23/2022   Chronic kidney disease, stage 3b (HCC) 02/04/2021   Long term (current) use of insulin  (HCC) 02/04/2021   Mild intermittent asthma 02/04/2021   Hypertrophic cardiomyopathy (HCC) 04/12/2020   Type 2 diabetes mellitus without complication, without long-term current use of insulin  (HCC) 04/12/2020   Anxiety 03/15/2020   Cardiomyopathy (HCC) 03/15/2020   Iron deficiency 03/15/2020   Osteoarthritis of knee 03/15/2020   Peripheral venous insufficiency 03/15/2020   Hypertensive heart disease without congestive heart failure 03/15/2020   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   Hyperlipidemia, unspecified 03/15/2020   Mixed hyperlipidemia 03/15/2020   Type 2 diabetes mellitus without complications (HCC) 03/15/2020   Acute respiratory failure (HCC) 03/08/2020   COPD with acute exacerbation (HCC) 03/08/2020   Acute on chronic respiratory failure with hypoxia and hypercapnia (HCC) 03/08/2020   Acute on chronic diastolic CHF (congestive heart failure) (HCC) 03/08/2020   Obesity hypoventilation syndrome (HCC) 03/08/2020   Arthritis    Diabetes mellitus without complication (HCC)    High cholesterol    Acute diastolic heart failure (HCC) 07/17/2019   Chronic hypoxemic respiratory failure (HCC)    CKD (chronic kidney disease)    Chest pain 07/14/2019   Aortic stenosis 11/01/2018   Heart block AV complete (HCC)    Acute on chronic respiratory failure with hypoxia (HCC) 10/20/2018   HOCM (hypertrophic obstructive cardiomyopathy) (HCC) 09/18/2017   Pure hypercholesterolemia  02/13/2017   CHF (congestive heart failure) (HCC) 12/22/2016   Type 2 diabetes mellitus with stage 3 chronic kidney disease, with long-term current use of insulin  (HCC) 11/19/2016   Essential hypertension 11/19/2016   Gout 11/19/2016   Obstructive sleep apnea  11/19/2016   Essential (primary) hypertension 11/19/2016   Acute hypercapnic respiratory failure (HCC) 07/21/2016   Drug allergy 07/21/2016   Morbid obesity with BMI of 60.0-69.9, adult (HCC) 07/21/2016   Acute on chronic congestive heart failure (HCC) 07/19/2016   Acute respiratory acidosis (HCC) 07/19/2016   Acute respiratory distress 07/19/2016   Type 2 diabetes mellitus with hyperglycemia (HCC) 07/19/2016   Leukocytosis 07/19/2016   Chronic diastolic heart failure (HCC) 03/17/2016   Hypertensive heart disease with heart failure (HCC) 03/17/2016   Hypoxia 03/17/2016   Atherosclerosis of native coronary artery of native heart without angina pectoris 03/17/2016   Nonrheumatic aortic valve stenosis 03/17/2016   Troponin level elevated 08/24/2015   SOB (shortness of breath) 08/24/2015   Hypokalemia 08/24/2015   Morbid obesity (HCC)    Hypertension    Asthma    Hypomagnesemia    Panniculitis 08/23/2015   Past Medical History:  Diagnosis Date   Acute diastolic heart failure (HCC) 07/17/2019   Acute hypercapnic respiratory failure (HCC) 07/21/2016   Acute on chronic congestive heart failure (HCC) 07/19/2016   Acute on chronic diastolic CHF (congestive heart failure) (HCC) 03/08/2020   Acute on chronic respiratory failure with hypoxia (HCC) 10/20/2018   Acute on chronic respiratory failure with hypoxia and hypercapnia (HCC) 03/08/2020   Acute respiratory acidosis (HCC) 07/19/2016   Acute respiratory distress 07/19/2016   Acute respiratory failure (HCC) 03/08/2020   Anxiety 03/15/2020   Aortic stenosis 11/01/2018   Arthritis    Asthma    Atherosclerosis of native coronary artery of native heart without angina pectoris 03/17/2016   Cardiomyopathy (HCC) 03/15/2020   Chest pain 07/14/2019   CHF (congestive heart failure) (HCC)    Chronic diastolic heart failure (HCC) 03/17/2016   Chronic hypoxemic respiratory failure (HCC)    Chronic kidney disease, stage 3b (HCC) 02/04/2021    CKD (chronic kidney disease)    COPD (chronic obstructive pulmonary disease) (HCC)    COPD with acute exacerbation (HCC) 03/08/2020   Diabetes mellitus without complication (HCC)    Drug allergy 07/21/2016   Essential (primary) hypertension 11/19/2016   Essential hypertension 11/19/2016   Gout 11/19/2016   Heart block AV complete (HCC)    High cholesterol    History of gout 04/05/2023   HOCM (hypertrophic obstructive cardiomyopathy) (HCC) 09/18/2017   Hyperlipidemia associated with type 2 diabetes mellitus (HCC) 11/23/2022   Hyperlipidemia, unspecified 03/15/2020   Hypertension    Hypertensive heart disease with heart failure (HCC) 03/17/2016   Hypertensive heart disease without congestive heart failure 03/15/2020   Hypertrophic cardiomyopathy (HCC) 04/12/2020   Hypokalemia 08/24/2015   Hypomagnesemia    Hypoxia 03/17/2016   Insomnia 11/23/2022   Iron deficiency 03/15/2020   Leukocytosis 07/19/2016   Long term (current) use of insulin  (HCC) 02/04/2021   Mild intermittent asthma 02/04/2021   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   Mixed hyperlipidemia 03/15/2020   Morbid obesity (HCC)    Morbid obesity with BMI of 60.0-69.9, adult (HCC) 07/21/2016   Need for influenza vaccination 11/23/2022   Need for pneumococcal 20-valent conjugate vaccination 11/23/2022   Nonrheumatic aortic valve stenosis 03/17/2016   Obesity hypoventilation syndrome (HCC) 03/08/2020   Obstructive sleep apnea 11/19/2016   Osteoarthritis of knee 03/15/2020   Panniculitis 08/23/2015  Peripheral venous insufficiency 03/15/2020   Pure hypercholesterolemia 02/13/2017   SOB (shortness of breath) 08/24/2015   Troponin level elevated 08/24/2015   Type 2 diabetes mellitus with hyperglycemia (HCC) 07/19/2016   Type 2 diabetes mellitus with stage 3 chronic kidney disease, with long-term current use of insulin  (HCC) 11/19/2016   Type 2 diabetes mellitus without complication, without long-term current use of insulin   (HCC) 04/12/2020   Type 2 diabetes mellitus without complications (HCC) 03/15/2020   Type 2 diabetes mellitus, without long-term current use of insulin  (HCC) 11/19/2016   Past Surgical History:  Procedure Laterality Date   ABDOMINAL HYSTERECTOMY     cellulitis debridement  2000   abdomen   CESAREAN SECTION     Social History   Tobacco Use   Smoking status: Never   Smokeless tobacco: Never  Vaping Use   Vaping status: Never Used  Substance Use Topics   Alcohol use: No   Drug use: No   Social History   Socioeconomic History   Marital status: Widowed    Spouse name: Not on file   Number of children: Not on file   Years of education: Not on file   Highest education level: Bachelor's degree (e.g., BA, AB, BS)  Occupational History   Occupation: retired    Comment: Copywriter, 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  Tobacco Use   Smoking status: Never   Smokeless tobacco: Never  Vaping Use   Vaping status: Never Used  Substance and Sexual Activity   Alcohol use: No   Drug use: No   Sexual activity: Never  Other Topics Concern   Not on file  Social History Narrative   Not on file   Social Drivers of Health   Financial Resource Strain: Medium Risk (07/10/2023)   Overall Financial Resource Strain (CARDIA)    Difficulty of Paying Living Expenses: Somewhat hard  Food Insecurity: No Food Insecurity (07/10/2023)   Hunger Vital Sign    Worried About Running Out of Food in the Last Year: Never true    Ran Out of Food in the Last Year: Never true  Transportation Needs: No Transportation Needs (07/10/2023)   PRAPARE - Administrator, Civil Service (Medical): No    Lack of Transportation (Non-Medical): No  Physical Activity: Inactive (07/10/2023)   Exercise Vital Sign    Days of Exercise per Week: 0 days    Minutes of Exercise per Session: Not on file  Stress: Stress Concern Present (07/10/2023)   [employer] of Occupational Health - Occupational Stress  Questionnaire    Feeling of Stress: To some extent  Social Connections: Moderately Integrated (07/10/2023)   Social Connection and Isolation Panel    Frequency of Communication with Friends and Family: More than three times a week    Frequency of Social Gatherings with Friends and Family: Once a week    Attends Religious Services: More than 4 times per year    Active Member of [employer] or Organizations: Yes    Attends Banker Meetings: More than 4 times per year    Marital Status: Widowed  Intimate Partner Violence: Not At Risk (02/13/2023)   Humiliation, Afraid, Rape, and Kick questionnaire    Fear of Current or Ex-Partner: No    Emotionally Abused: No    Physically Abused: No    Sexually Abused: No   Family Status  Relation Name Status   Mother  Deceased   Father  Deceased   Sister  Alive  Sister  Alive   Sister  Deceased at age 34   Brother  Alive   Brother  Alive   Brother  Alive   Brother  Alive   Brother  Alive   Brother  Deceased at age 13  No partnership data on file   Family History  Problem Relation Age of Onset   Diabetes Mellitus I Mother    CAD Mother    Diabetes Mother    Heart disease Father    Cancer Father    Breast cancer Sister    Cancer Sister    Heart disease Sister    Prostate cancer Brother    Allergies  Allergen Reactions   Atorvastatin Anaphylaxis and Swelling    Lip swelling    Ciprofloxacin Anaphylaxis    Kidney failure   Ciprocinonide [Fluocinolone] Other (See Comments)    Kidney failure   Catapres [Clonidine Hcl] Rash   Clonidine Rash   Demadex [Torsemide] Rash      Review of Systems  Constitutional:  Negative for fever and malaise/fatigue.  HENT:  Negative for congestion.   Eyes:  Negative for blurred vision.  Respiratory:  Positive for shortness of breath. Negative for cough.   Cardiovascular:  Negative for chest pain, palpitations and leg swelling.  Gastrointestinal:  Negative for vomiting.  Musculoskeletal:   Negative for back pain.  Skin:  Negative for rash.  Neurological:  Negative for loss of consciousness and headaches.  Psychiatric/Behavioral:  Negative for depression, hallucinations, memory loss, substance abuse and suicidal ideas. The patient is nervous/anxious. The patient does not have insomnia.       Objective:     BP 136/80 (BP Location: Left Arm, Patient Position: Sitting, Cuff Size: Large)   Pulse (!) 55   Temp 97.9 F (36.6 C) (Oral)   Resp 18   Ht 5' 3 (1.6 m)   Wt 278 lb 9.6 oz (126.4 kg)   LMP  (LMP Unknown)   SpO2 96% Comment: 4L of O2  BMI 49.35 kg/m  BP Readings from Last 3 Encounters:  07/10/23 136/80  05/11/23 (!) 140/88  05/02/23 (!) 152/74   Wt Readings from Last 3 Encounters:  07/10/23 278 lb 9.6 oz (126.4 kg)  05/11/23 281 lb 8 oz (127.7 kg)  05/02/23 (!) 302 lb 6.4 oz (137.2 kg)   SpO2 Readings from Last 3 Encounters:  07/10/23 96%  05/11/23 97%  05/02/23 97%      Physical Exam Vitals and nursing note reviewed.  Constitutional:      General: She is not in acute distress.    Appearance: Normal appearance. She is well-developed.  HENT:     Head: Normocephalic and atraumatic.  Eyes:     General: No scleral icterus.       Right eye: No discharge.        Left eye: No discharge.  Cardiovascular:     Rate and Rhythm: Normal rate and regular rhythm.     Heart sounds: No murmur heard. Pulmonary:     Effort: Pulmonary effort is normal. No respiratory distress.     Breath sounds: Normal breath sounds.  Musculoskeletal:        General: Normal range of motion.     Cervical back: Normal range of motion and neck supple.     Right lower leg: No tenderness. 1+ Pitting Edema present.     Left lower leg: No tenderness. 2+ Pitting Edema present.     Right ankle: Swelling present.  Left ankle: Swelling present.  Skin:    General: Skin is warm and dry.  Neurological:     General: No focal deficit present.     Mental Status: She is alert and  oriented to person, place, and time.  Psychiatric:        Mood and Affect: Affect normal. Mood is anxious. Mood is not elated. Affect is not labile, blunt, flat, angry, tearful or inappropriate.        Behavior: Behavior normal.        Thought Content: Thought content normal.        Judgment: Judgment normal.      No results found for any visits on 07/10/23.  Last CBC Lab Results  Component Value Date   WBC 7.7 11/23/2022   HGB 11.3 (L) 11/23/2022   HCT 36.4 11/23/2022   MCV 89.9 11/23/2022   MCH 26.9 03/12/2020   RDW 17.4 (H) 11/23/2022   PLT 148.0 (L) 11/23/2022   Last metabolic panel Lab Results  Component Value Date   GLUCOSE 82 05/11/2023   NA 144 05/11/2023   K 3.8 05/11/2023   CL 97 05/11/2023   CO2 39 (H) 05/11/2023   BUN 31 (H) 05/11/2023   CREATININE 1.27 (H) 05/11/2023   GFR 41.66 (L) 05/11/2023   CALCIUM  9.0 05/11/2023   PHOS 3.1 03/10/2020   PROT 6.0 05/11/2023   ALBUMIN 4.1 05/11/2023   LABGLOB 2.4 09/28/2022   BILITOT 0.5 05/11/2023   ALKPHOS 56 05/11/2023   AST 16 05/11/2023   ALT 18 05/11/2023   ANIONGAP 12 03/12/2020   Last lipids Lab Results  Component Value Date   CHOL 153 02/27/2023   HDL 37.30 (L) 02/27/2023   LDLCALC 94 02/27/2023   TRIG 106.0 02/27/2023   CHOLHDL 4 02/27/2023   Last hemoglobin A1c Lab Results  Component Value Date   HGBA1C 8.1 (H) 11/23/2022   Last thyroid  functions Lab Results  Component Value Date   TSH 0.662 03/09/2020   Last vitamin D  No results found for: 25OHVITD2, 25OHVITD3, VD25OH Last vitamin B12 and Folate Lab Results  Component Value Date   VITAMINB12 1,292 (H) 03/08/2020   FOLATE 36.5 03/08/2020      The ASCVD Risk score (Arnett DK, et al., 2019) failed to calculate for the following reasons:   Risk score cannot be calculated because patient has a medical history suggesting prior/existing ASCVD    Assessment & Plan:   Problem List Items Addressed This Visit       Unprioritized    SOB (shortness of breath)   O2 dependant----  increased to 4 L F/u pulmonary      Depression with anxiety - Primary   Increase zoloft  100 mg 1 1/2 tab every day  [facility]er to [facility]  F/u 1 month      Relevant Medications   sertraline  (ZOLOFT ) 100 MG tablet   Other Relevant Orders   Ambulatory referral to Behavioral Health   Anxiety   Refer for counseling  Inc zoloft  150 mg daily F/u 1 month       Relevant Medications   sertraline  (ZOLOFT ) 100 MG tablet   Acute on chronic congestive heart failure (HCC)   PER cardiology----  they d/c hydrochlorothiazide and started lasix  yesterday Bnp done yesterday is elevated but pt did drop 2 lbs overnight Pt will f/u with cardiology at [facility] O2 at 4 L [place]      Assessment and Plan Assessment & Plan Shortness of breath  She experiences shortness of breath with oxygen  saturation dropping to 81-83%. Increasing oxygen  to 4 L/min has not significantly improved her condition, likely due to fluid overload from congestive heart failure. She should avoid heat exposure as it worsens her symptoms. Increase oxygen  to 4 L/min temporarily, avoid heat exposure, monitor weight daily, and report any increase to her cardiologist. Follow the pulmonologist's advice regarding oxygen  levels.  Congestive heart failure   She has congestive heart failure with a recent 8-pound weight gain over a couple of weeks, with fluctuation. Her cardiologist adjusted diuretics from hydrochlorothiazide to furosemide  40 mg, resulting in slight weight loss. Labs indicate dehydration and concern for heart failure, with BNP levels checked to assess severity. She should continue furosemide  40 mg, monitor weight and fluid status, and contact her cardiologist if weight increases. Follow up with her cardiologist as needed.  Anxiety   Her ongoing anxiety is managed with sertraline  since March, with reported improvement but afternoon shakiness, possibly anxiety-related.  Therapy is under consideration, and she is advised to increase sertraline  to 1.5 tablets. She is informed that sertraline  can take 2 weeks to 2 months for noticeable effects. Increase sertraline  to 1.5 tablets daily, consider therapy referral if needed, and follow up in one month to assess anxiety management.    Return in about 4 weeks (around 08/07/2023), or if symptoms worsen or fail to improve, for anxiety.    Xiana Carns R Lowne [name], DO

## 2023-07-10 NOTE — Assessment & Plan Note (Signed)
 PER cardiology----  they d/c hydrochlorothiazide and started lasix  yesterday Bnp done yesterday is elevated but pt did drop 2 lbs overnight Pt will f/u with cardiology at [facility] O2 at 4 L [place]

## 2023-07-10 NOTE — Assessment & Plan Note (Signed)
 O2 dependant----  increased to 4 L F/u pulmonary

## 2023-07-11 ENCOUNTER — Other Ambulatory Visit: Payer: Self-pay | Admitting: Cardiology

## 2023-07-11 ENCOUNTER — Encounter: Payer: Self-pay | Admitting: Family Medicine

## 2023-07-11 ENCOUNTER — Other Ambulatory Visit: Payer: Self-pay | Admitting: Family Medicine

## 2023-07-11 DIAGNOSIS — M109 Gout, unspecified: Secondary | ICD-10-CM | POA: Diagnosis not present

## 2023-07-11 DIAGNOSIS — J4520 Mild intermittent asthma, uncomplicated: Secondary | ICD-10-CM | POA: Diagnosis not present

## 2023-07-11 DIAGNOSIS — G4700 Insomnia, unspecified: Secondary | ICD-10-CM

## 2023-07-11 DIAGNOSIS — Z794 Long term (current) use of insulin: Secondary | ICD-10-CM | POA: Diagnosis not present

## 2023-07-11 DIAGNOSIS — I130 Hypertensive heart and chronic kidney disease with heart failure and stage 1 through stage 4 chronic kidney disease, or unspecified chronic kidney disease: Secondary | ICD-10-CM | POA: Diagnosis not present

## 2023-07-11 DIAGNOSIS — E1122 Type 2 diabetes mellitus with diabetic chronic kidney disease: Secondary | ICD-10-CM | POA: Diagnosis not present

## 2023-07-11 DIAGNOSIS — I059 Rheumatic mitral valve disease, unspecified: Secondary | ICD-10-CM | POA: Diagnosis not present

## 2023-07-11 DIAGNOSIS — Z7982 Long term (current) use of aspirin: Secondary | ICD-10-CM | POA: Diagnosis not present

## 2023-07-11 DIAGNOSIS — M179 Osteoarthritis of knee, unspecified: Secondary | ICD-10-CM | POA: Diagnosis not present

## 2023-07-11 DIAGNOSIS — E1151 Type 2 diabetes mellitus with diabetic peripheral angiopathy without gangrene: Secondary | ICD-10-CM | POA: Diagnosis not present

## 2023-07-11 DIAGNOSIS — I422 Other hypertrophic cardiomyopathy: Secondary | ICD-10-CM | POA: Diagnosis not present

## 2023-07-11 DIAGNOSIS — N1832 Chronic kidney disease, stage 3b: Secondary | ICD-10-CM | POA: Diagnosis not present

## 2023-07-11 DIAGNOSIS — E1169 Type 2 diabetes mellitus with other specified complication: Secondary | ICD-10-CM | POA: Diagnosis not present

## 2023-07-11 DIAGNOSIS — I872 Venous insufficiency (chronic) (peripheral): Secondary | ICD-10-CM | POA: Diagnosis not present

## 2023-07-11 DIAGNOSIS — I2510 Atherosclerotic heart disease of native coronary artery without angina pectoris: Secondary | ICD-10-CM | POA: Diagnosis not present

## 2023-07-11 DIAGNOSIS — I5043 Acute on chronic combined systolic (congestive) and diastolic (congestive) heart failure: Secondary | ICD-10-CM | POA: Diagnosis not present

## 2023-07-11 DIAGNOSIS — I350 Nonrheumatic aortic (valve) stenosis: Secondary | ICD-10-CM | POA: Diagnosis not present

## 2023-07-11 DIAGNOSIS — I442 Atrioventricular block, complete: Secondary | ICD-10-CM | POA: Diagnosis not present

## 2023-07-11 DIAGNOSIS — E782 Mixed hyperlipidemia: Secondary | ICD-10-CM | POA: Diagnosis not present

## 2023-07-11 DIAGNOSIS — J9621 Acute and chronic respiratory failure with hypoxia: Secondary | ICD-10-CM | POA: Diagnosis not present

## 2023-07-11 DIAGNOSIS — J9622 Acute and chronic respiratory failure with hypercapnia: Secondary | ICD-10-CM | POA: Diagnosis not present

## 2023-07-11 DIAGNOSIS — J4489 Other specified chronic obstructive pulmonary disease: Secondary | ICD-10-CM | POA: Diagnosis not present

## 2023-07-11 NOTE — Telephone Encounter (Signed)
 Requesting: belsomra  20mg   Contract: None UDS: None Last Visit: 07/10/23 Next Visit: 08/07/23 Last Refill: 02/08/23 #30 and 3rf   Please Advise

## 2023-07-13 DIAGNOSIS — E1151 Type 2 diabetes mellitus with diabetic peripheral angiopathy without gangrene: Secondary | ICD-10-CM | POA: Diagnosis not present

## 2023-07-13 DIAGNOSIS — J9622 Acute and chronic respiratory failure with hypercapnia: Secondary | ICD-10-CM | POA: Diagnosis not present

## 2023-07-13 DIAGNOSIS — I130 Hypertensive heart and chronic kidney disease with heart failure and stage 1 through stage 4 chronic kidney disease, or unspecified chronic kidney disease: Secondary | ICD-10-CM | POA: Diagnosis not present

## 2023-07-13 DIAGNOSIS — I442 Atrioventricular block, complete: Secondary | ICD-10-CM | POA: Diagnosis not present

## 2023-07-13 DIAGNOSIS — E782 Mixed hyperlipidemia: Secondary | ICD-10-CM | POA: Diagnosis not present

## 2023-07-13 DIAGNOSIS — I2510 Atherosclerotic heart disease of native coronary artery without angina pectoris: Secondary | ICD-10-CM | POA: Diagnosis not present

## 2023-07-13 DIAGNOSIS — G4700 Insomnia, unspecified: Secondary | ICD-10-CM | POA: Diagnosis not present

## 2023-07-13 DIAGNOSIS — J9621 Acute and chronic respiratory failure with hypoxia: Secondary | ICD-10-CM | POA: Diagnosis not present

## 2023-07-13 DIAGNOSIS — M109 Gout, unspecified: Secondary | ICD-10-CM | POA: Diagnosis not present

## 2023-07-13 DIAGNOSIS — I872 Venous insufficiency (chronic) (peripheral): Secondary | ICD-10-CM | POA: Diagnosis not present

## 2023-07-13 DIAGNOSIS — J4520 Mild intermittent asthma, uncomplicated: Secondary | ICD-10-CM | POA: Diagnosis not present

## 2023-07-13 DIAGNOSIS — N1832 Chronic kidney disease, stage 3b: Secondary | ICD-10-CM | POA: Diagnosis not present

## 2023-07-13 DIAGNOSIS — I5043 Acute on chronic combined systolic (congestive) and diastolic (congestive) heart failure: Secondary | ICD-10-CM | POA: Diagnosis not present

## 2023-07-13 DIAGNOSIS — I059 Rheumatic mitral valve disease, unspecified: Secondary | ICD-10-CM | POA: Diagnosis not present

## 2023-07-13 DIAGNOSIS — J4489 Other specified chronic obstructive pulmonary disease: Secondary | ICD-10-CM | POA: Diagnosis not present

## 2023-07-13 DIAGNOSIS — Z7982 Long term (current) use of aspirin: Secondary | ICD-10-CM | POA: Diagnosis not present

## 2023-07-13 DIAGNOSIS — I422 Other hypertrophic cardiomyopathy: Secondary | ICD-10-CM | POA: Diagnosis not present

## 2023-07-13 DIAGNOSIS — E1169 Type 2 diabetes mellitus with other specified complication: Secondary | ICD-10-CM | POA: Diagnosis not present

## 2023-07-13 DIAGNOSIS — E1122 Type 2 diabetes mellitus with diabetic chronic kidney disease: Secondary | ICD-10-CM | POA: Diagnosis not present

## 2023-07-13 DIAGNOSIS — I350 Nonrheumatic aortic (valve) stenosis: Secondary | ICD-10-CM | POA: Diagnosis not present

## 2023-07-13 DIAGNOSIS — Z794 Long term (current) use of insulin: Secondary | ICD-10-CM | POA: Diagnosis not present

## 2023-07-13 DIAGNOSIS — M179 Osteoarthritis of knee, unspecified: Secondary | ICD-10-CM | POA: Diagnosis not present

## 2023-07-17 DIAGNOSIS — I059 Rheumatic mitral valve disease, unspecified: Secondary | ICD-10-CM | POA: Diagnosis not present

## 2023-07-17 DIAGNOSIS — Z7982 Long term (current) use of aspirin: Secondary | ICD-10-CM | POA: Diagnosis not present

## 2023-07-17 DIAGNOSIS — I872 Venous insufficiency (chronic) (peripheral): Secondary | ICD-10-CM | POA: Diagnosis not present

## 2023-07-17 DIAGNOSIS — N1832 Chronic kidney disease, stage 3b: Secondary | ICD-10-CM | POA: Diagnosis not present

## 2023-07-17 DIAGNOSIS — Z794 Long term (current) use of insulin: Secondary | ICD-10-CM | POA: Diagnosis not present

## 2023-07-17 DIAGNOSIS — I350 Nonrheumatic aortic (valve) stenosis: Secondary | ICD-10-CM | POA: Diagnosis not present

## 2023-07-17 DIAGNOSIS — I422 Other hypertrophic cardiomyopathy: Secondary | ICD-10-CM | POA: Diagnosis not present

## 2023-07-17 DIAGNOSIS — J4520 Mild intermittent asthma, uncomplicated: Secondary | ICD-10-CM | POA: Diagnosis not present

## 2023-07-17 DIAGNOSIS — E1122 Type 2 diabetes mellitus with diabetic chronic kidney disease: Secondary | ICD-10-CM | POA: Diagnosis not present

## 2023-07-17 DIAGNOSIS — E782 Mixed hyperlipidemia: Secondary | ICD-10-CM | POA: Diagnosis not present

## 2023-07-17 DIAGNOSIS — G4700 Insomnia, unspecified: Secondary | ICD-10-CM | POA: Diagnosis not present

## 2023-07-17 DIAGNOSIS — E1151 Type 2 diabetes mellitus with diabetic peripheral angiopathy without gangrene: Secondary | ICD-10-CM | POA: Diagnosis not present

## 2023-07-17 DIAGNOSIS — M179 Osteoarthritis of knee, unspecified: Secondary | ICD-10-CM | POA: Diagnosis not present

## 2023-07-17 DIAGNOSIS — J4489 Other specified chronic obstructive pulmonary disease: Secondary | ICD-10-CM | POA: Diagnosis not present

## 2023-07-17 DIAGNOSIS — J9621 Acute and chronic respiratory failure with hypoxia: Secondary | ICD-10-CM | POA: Diagnosis not present

## 2023-07-17 DIAGNOSIS — I442 Atrioventricular block, complete: Secondary | ICD-10-CM | POA: Diagnosis not present

## 2023-07-17 DIAGNOSIS — M109 Gout, unspecified: Secondary | ICD-10-CM | POA: Diagnosis not present

## 2023-07-17 DIAGNOSIS — E1169 Type 2 diabetes mellitus with other specified complication: Secondary | ICD-10-CM | POA: Diagnosis not present

## 2023-07-17 DIAGNOSIS — I5043 Acute on chronic combined systolic (congestive) and diastolic (congestive) heart failure: Secondary | ICD-10-CM | POA: Diagnosis not present

## 2023-07-17 DIAGNOSIS — I2510 Atherosclerotic heart disease of native coronary artery without angina pectoris: Secondary | ICD-10-CM | POA: Diagnosis not present

## 2023-07-17 DIAGNOSIS — I130 Hypertensive heart and chronic kidney disease with heart failure and stage 1 through stage 4 chronic kidney disease, or unspecified chronic kidney disease: Secondary | ICD-10-CM | POA: Diagnosis not present

## 2023-07-17 DIAGNOSIS — J9622 Acute and chronic respiratory failure with hypercapnia: Secondary | ICD-10-CM | POA: Diagnosis not present

## 2023-07-18 DIAGNOSIS — M109 Gout, unspecified: Secondary | ICD-10-CM | POA: Diagnosis not present

## 2023-07-18 DIAGNOSIS — I5043 Acute on chronic combined systolic (congestive) and diastolic (congestive) heart failure: Secondary | ICD-10-CM | POA: Diagnosis not present

## 2023-07-18 DIAGNOSIS — E782 Mixed hyperlipidemia: Secondary | ICD-10-CM | POA: Diagnosis not present

## 2023-07-18 DIAGNOSIS — M179 Osteoarthritis of knee, unspecified: Secondary | ICD-10-CM | POA: Diagnosis not present

## 2023-07-18 DIAGNOSIS — Z7982 Long term (current) use of aspirin: Secondary | ICD-10-CM | POA: Diagnosis not present

## 2023-07-18 DIAGNOSIS — I350 Nonrheumatic aortic (valve) stenosis: Secondary | ICD-10-CM | POA: Diagnosis not present

## 2023-07-18 DIAGNOSIS — E1169 Type 2 diabetes mellitus with other specified complication: Secondary | ICD-10-CM | POA: Diagnosis not present

## 2023-07-18 DIAGNOSIS — N1832 Chronic kidney disease, stage 3b: Secondary | ICD-10-CM | POA: Diagnosis not present

## 2023-07-18 DIAGNOSIS — G4700 Insomnia, unspecified: Secondary | ICD-10-CM | POA: Diagnosis not present

## 2023-07-18 DIAGNOSIS — I442 Atrioventricular block, complete: Secondary | ICD-10-CM | POA: Diagnosis not present

## 2023-07-18 DIAGNOSIS — J4489 Other specified chronic obstructive pulmonary disease: Secondary | ICD-10-CM | POA: Diagnosis not present

## 2023-07-18 DIAGNOSIS — J9622 Acute and chronic respiratory failure with hypercapnia: Secondary | ICD-10-CM | POA: Diagnosis not present

## 2023-07-18 DIAGNOSIS — I872 Venous insufficiency (chronic) (peripheral): Secondary | ICD-10-CM | POA: Diagnosis not present

## 2023-07-18 DIAGNOSIS — E1151 Type 2 diabetes mellitus with diabetic peripheral angiopathy without gangrene: Secondary | ICD-10-CM | POA: Diagnosis not present

## 2023-07-18 DIAGNOSIS — J4520 Mild intermittent asthma, uncomplicated: Secondary | ICD-10-CM | POA: Diagnosis not present

## 2023-07-18 DIAGNOSIS — I130 Hypertensive heart and chronic kidney disease with heart failure and stage 1 through stage 4 chronic kidney disease, or unspecified chronic kidney disease: Secondary | ICD-10-CM | POA: Diagnosis not present

## 2023-07-18 DIAGNOSIS — I2510 Atherosclerotic heart disease of native coronary artery without angina pectoris: Secondary | ICD-10-CM | POA: Diagnosis not present

## 2023-07-18 DIAGNOSIS — E1122 Type 2 diabetes mellitus with diabetic chronic kidney disease: Secondary | ICD-10-CM | POA: Diagnosis not present

## 2023-07-18 DIAGNOSIS — I059 Rheumatic mitral valve disease, unspecified: Secondary | ICD-10-CM | POA: Diagnosis not present

## 2023-07-18 DIAGNOSIS — Z794 Long term (current) use of insulin: Secondary | ICD-10-CM | POA: Diagnosis not present

## 2023-07-18 DIAGNOSIS — I422 Other hypertrophic cardiomyopathy: Secondary | ICD-10-CM | POA: Diagnosis not present

## 2023-07-18 DIAGNOSIS — J9621 Acute and chronic respiratory failure with hypoxia: Secondary | ICD-10-CM | POA: Diagnosis not present

## 2023-07-25 DIAGNOSIS — I059 Rheumatic mitral valve disease, unspecified: Secondary | ICD-10-CM | POA: Diagnosis not present

## 2023-07-25 DIAGNOSIS — Z794 Long term (current) use of insulin: Secondary | ICD-10-CM | POA: Diagnosis not present

## 2023-07-25 DIAGNOSIS — J9622 Acute and chronic respiratory failure with hypercapnia: Secondary | ICD-10-CM | POA: Diagnosis not present

## 2023-07-25 DIAGNOSIS — Z7982 Long term (current) use of aspirin: Secondary | ICD-10-CM | POA: Diagnosis not present

## 2023-07-25 DIAGNOSIS — J9621 Acute and chronic respiratory failure with hypoxia: Secondary | ICD-10-CM | POA: Diagnosis not present

## 2023-07-25 DIAGNOSIS — J4520 Mild intermittent asthma, uncomplicated: Secondary | ICD-10-CM | POA: Diagnosis not present

## 2023-07-25 DIAGNOSIS — I5043 Acute on chronic combined systolic (congestive) and diastolic (congestive) heart failure: Secondary | ICD-10-CM | POA: Diagnosis not present

## 2023-07-25 DIAGNOSIS — E1169 Type 2 diabetes mellitus with other specified complication: Secondary | ICD-10-CM | POA: Diagnosis not present

## 2023-07-25 DIAGNOSIS — E1151 Type 2 diabetes mellitus with diabetic peripheral angiopathy without gangrene: Secondary | ICD-10-CM | POA: Diagnosis not present

## 2023-07-25 DIAGNOSIS — M179 Osteoarthritis of knee, unspecified: Secondary | ICD-10-CM | POA: Diagnosis not present

## 2023-07-25 DIAGNOSIS — I422 Other hypertrophic cardiomyopathy: Secondary | ICD-10-CM | POA: Diagnosis not present

## 2023-07-25 DIAGNOSIS — I442 Atrioventricular block, complete: Secondary | ICD-10-CM | POA: Diagnosis not present

## 2023-07-25 DIAGNOSIS — J4489 Other specified chronic obstructive pulmonary disease: Secondary | ICD-10-CM | POA: Diagnosis not present

## 2023-07-25 DIAGNOSIS — I350 Nonrheumatic aortic (valve) stenosis: Secondary | ICD-10-CM | POA: Diagnosis not present

## 2023-07-25 DIAGNOSIS — N1832 Chronic kidney disease, stage 3b: Secondary | ICD-10-CM | POA: Diagnosis not present

## 2023-07-25 DIAGNOSIS — G4700 Insomnia, unspecified: Secondary | ICD-10-CM | POA: Diagnosis not present

## 2023-07-25 DIAGNOSIS — M109 Gout, unspecified: Secondary | ICD-10-CM | POA: Diagnosis not present

## 2023-07-25 DIAGNOSIS — E1122 Type 2 diabetes mellitus with diabetic chronic kidney disease: Secondary | ICD-10-CM | POA: Diagnosis not present

## 2023-07-25 DIAGNOSIS — I872 Venous insufficiency (chronic) (peripheral): Secondary | ICD-10-CM | POA: Diagnosis not present

## 2023-07-25 DIAGNOSIS — I2510 Atherosclerotic heart disease of native coronary artery without angina pectoris: Secondary | ICD-10-CM | POA: Diagnosis not present

## 2023-07-25 DIAGNOSIS — I130 Hypertensive heart and chronic kidney disease with heart failure and stage 1 through stage 4 chronic kidney disease, or unspecified chronic kidney disease: Secondary | ICD-10-CM | POA: Diagnosis not present

## 2023-07-25 DIAGNOSIS — E782 Mixed hyperlipidemia: Secondary | ICD-10-CM | POA: Diagnosis not present

## 2023-07-26 DIAGNOSIS — Z7982 Long term (current) use of aspirin: Secondary | ICD-10-CM | POA: Diagnosis not present

## 2023-07-26 DIAGNOSIS — G4700 Insomnia, unspecified: Secondary | ICD-10-CM | POA: Diagnosis not present

## 2023-07-26 DIAGNOSIS — I872 Venous insufficiency (chronic) (peripheral): Secondary | ICD-10-CM | POA: Diagnosis not present

## 2023-07-26 DIAGNOSIS — I350 Nonrheumatic aortic (valve) stenosis: Secondary | ICD-10-CM | POA: Diagnosis not present

## 2023-07-26 DIAGNOSIS — E1151 Type 2 diabetes mellitus with diabetic peripheral angiopathy without gangrene: Secondary | ICD-10-CM | POA: Diagnosis not present

## 2023-07-26 DIAGNOSIS — J4489 Other specified chronic obstructive pulmonary disease: Secondary | ICD-10-CM | POA: Diagnosis not present

## 2023-07-26 DIAGNOSIS — I130 Hypertensive heart and chronic kidney disease with heart failure and stage 1 through stage 4 chronic kidney disease, or unspecified chronic kidney disease: Secondary | ICD-10-CM | POA: Diagnosis not present

## 2023-07-26 DIAGNOSIS — N1832 Chronic kidney disease, stage 3b: Secondary | ICD-10-CM | POA: Diagnosis not present

## 2023-07-26 DIAGNOSIS — I2510 Atherosclerotic heart disease of native coronary artery without angina pectoris: Secondary | ICD-10-CM | POA: Diagnosis not present

## 2023-07-26 DIAGNOSIS — J9621 Acute and chronic respiratory failure with hypoxia: Secondary | ICD-10-CM | POA: Diagnosis not present

## 2023-07-26 DIAGNOSIS — E1169 Type 2 diabetes mellitus with other specified complication: Secondary | ICD-10-CM | POA: Diagnosis not present

## 2023-07-26 DIAGNOSIS — I5043 Acute on chronic combined systolic (congestive) and diastolic (congestive) heart failure: Secondary | ICD-10-CM | POA: Diagnosis not present

## 2023-07-26 DIAGNOSIS — E1122 Type 2 diabetes mellitus with diabetic chronic kidney disease: Secondary | ICD-10-CM | POA: Diagnosis not present

## 2023-07-26 DIAGNOSIS — I059 Rheumatic mitral valve disease, unspecified: Secondary | ICD-10-CM | POA: Diagnosis not present

## 2023-07-26 DIAGNOSIS — I442 Atrioventricular block, complete: Secondary | ICD-10-CM | POA: Diagnosis not present

## 2023-07-26 DIAGNOSIS — M109 Gout, unspecified: Secondary | ICD-10-CM | POA: Diagnosis not present

## 2023-07-26 DIAGNOSIS — E782 Mixed hyperlipidemia: Secondary | ICD-10-CM | POA: Diagnosis not present

## 2023-07-26 DIAGNOSIS — J9622 Acute and chronic respiratory failure with hypercapnia: Secondary | ICD-10-CM | POA: Diagnosis not present

## 2023-07-26 DIAGNOSIS — I422 Other hypertrophic cardiomyopathy: Secondary | ICD-10-CM | POA: Diagnosis not present

## 2023-07-26 DIAGNOSIS — M179 Osteoarthritis of knee, unspecified: Secondary | ICD-10-CM | POA: Diagnosis not present

## 2023-07-26 DIAGNOSIS — J4520 Mild intermittent asthma, uncomplicated: Secondary | ICD-10-CM | POA: Diagnosis not present

## 2023-07-26 DIAGNOSIS — Z794 Long term (current) use of insulin: Secondary | ICD-10-CM | POA: Diagnosis not present

## 2023-07-30 DIAGNOSIS — I130 Hypertensive heart and chronic kidney disease with heart failure and stage 1 through stage 4 chronic kidney disease, or unspecified chronic kidney disease: Secondary | ICD-10-CM | POA: Diagnosis not present

## 2023-07-30 DIAGNOSIS — J9622 Acute and chronic respiratory failure with hypercapnia: Secondary | ICD-10-CM | POA: Diagnosis not present

## 2023-07-30 DIAGNOSIS — E1151 Type 2 diabetes mellitus with diabetic peripheral angiopathy without gangrene: Secondary | ICD-10-CM | POA: Diagnosis not present

## 2023-07-30 DIAGNOSIS — E1122 Type 2 diabetes mellitus with diabetic chronic kidney disease: Secondary | ICD-10-CM | POA: Diagnosis not present

## 2023-07-30 DIAGNOSIS — N1832 Chronic kidney disease, stage 3b: Secondary | ICD-10-CM | POA: Diagnosis not present

## 2023-07-30 DIAGNOSIS — M179 Osteoarthritis of knee, unspecified: Secondary | ICD-10-CM | POA: Diagnosis not present

## 2023-07-30 DIAGNOSIS — I442 Atrioventricular block, complete: Secondary | ICD-10-CM | POA: Diagnosis not present

## 2023-07-30 DIAGNOSIS — E1169 Type 2 diabetes mellitus with other specified complication: Secondary | ICD-10-CM | POA: Diagnosis not present

## 2023-07-30 DIAGNOSIS — Z7982 Long term (current) use of aspirin: Secondary | ICD-10-CM | POA: Diagnosis not present

## 2023-07-30 DIAGNOSIS — J9621 Acute and chronic respiratory failure with hypoxia: Secondary | ICD-10-CM | POA: Diagnosis not present

## 2023-07-30 DIAGNOSIS — I872 Venous insufficiency (chronic) (peripheral): Secondary | ICD-10-CM | POA: Diagnosis not present

## 2023-07-30 DIAGNOSIS — M109 Gout, unspecified: Secondary | ICD-10-CM | POA: Diagnosis not present

## 2023-07-30 DIAGNOSIS — Z794 Long term (current) use of insulin: Secondary | ICD-10-CM | POA: Diagnosis not present

## 2023-07-30 DIAGNOSIS — G4700 Insomnia, unspecified: Secondary | ICD-10-CM | POA: Diagnosis not present

## 2023-07-30 DIAGNOSIS — I059 Rheumatic mitral valve disease, unspecified: Secondary | ICD-10-CM | POA: Diagnosis not present

## 2023-07-30 DIAGNOSIS — I422 Other hypertrophic cardiomyopathy: Secondary | ICD-10-CM | POA: Diagnosis not present

## 2023-07-30 DIAGNOSIS — I350 Nonrheumatic aortic (valve) stenosis: Secondary | ICD-10-CM | POA: Diagnosis not present

## 2023-07-30 DIAGNOSIS — I2510 Atherosclerotic heart disease of native coronary artery without angina pectoris: Secondary | ICD-10-CM | POA: Diagnosis not present

## 2023-07-30 DIAGNOSIS — J4520 Mild intermittent asthma, uncomplicated: Secondary | ICD-10-CM | POA: Diagnosis not present

## 2023-07-30 DIAGNOSIS — E782 Mixed hyperlipidemia: Secondary | ICD-10-CM | POA: Diagnosis not present

## 2023-07-30 DIAGNOSIS — J4489 Other specified chronic obstructive pulmonary disease: Secondary | ICD-10-CM | POA: Diagnosis not present

## 2023-07-30 DIAGNOSIS — I5043 Acute on chronic combined systolic (congestive) and diastolic (congestive) heart failure: Secondary | ICD-10-CM | POA: Diagnosis not present

## 2023-07-31 ENCOUNTER — Encounter: Payer: Self-pay | Admitting: Cardiology

## 2023-07-31 DIAGNOSIS — I2510 Atherosclerotic heart disease of native coronary artery without angina pectoris: Secondary | ICD-10-CM | POA: Diagnosis not present

## 2023-07-31 DIAGNOSIS — M179 Osteoarthritis of knee, unspecified: Secondary | ICD-10-CM | POA: Diagnosis not present

## 2023-07-31 DIAGNOSIS — J4489 Other specified chronic obstructive pulmonary disease: Secondary | ICD-10-CM | POA: Diagnosis not present

## 2023-07-31 DIAGNOSIS — E1169 Type 2 diabetes mellitus with other specified complication: Secondary | ICD-10-CM | POA: Diagnosis not present

## 2023-07-31 DIAGNOSIS — E782 Mixed hyperlipidemia: Secondary | ICD-10-CM | POA: Diagnosis not present

## 2023-07-31 DIAGNOSIS — I5043 Acute on chronic combined systolic (congestive) and diastolic (congestive) heart failure: Secondary | ICD-10-CM | POA: Diagnosis not present

## 2023-07-31 DIAGNOSIS — E1151 Type 2 diabetes mellitus with diabetic peripheral angiopathy without gangrene: Secondary | ICD-10-CM | POA: Diagnosis not present

## 2023-07-31 DIAGNOSIS — I422 Other hypertrophic cardiomyopathy: Secondary | ICD-10-CM | POA: Diagnosis not present

## 2023-07-31 DIAGNOSIS — I442 Atrioventricular block, complete: Secondary | ICD-10-CM | POA: Diagnosis not present

## 2023-07-31 DIAGNOSIS — J4520 Mild intermittent asthma, uncomplicated: Secondary | ICD-10-CM | POA: Diagnosis not present

## 2023-07-31 DIAGNOSIS — I872 Venous insufficiency (chronic) (peripheral): Secondary | ICD-10-CM | POA: Diagnosis not present

## 2023-07-31 DIAGNOSIS — I059 Rheumatic mitral valve disease, unspecified: Secondary | ICD-10-CM | POA: Diagnosis not present

## 2023-07-31 DIAGNOSIS — N1832 Chronic kidney disease, stage 3b: Secondary | ICD-10-CM | POA: Diagnosis not present

## 2023-07-31 DIAGNOSIS — I350 Nonrheumatic aortic (valve) stenosis: Secondary | ICD-10-CM | POA: Diagnosis not present

## 2023-07-31 DIAGNOSIS — J9621 Acute and chronic respiratory failure with hypoxia: Secondary | ICD-10-CM | POA: Diagnosis not present

## 2023-07-31 DIAGNOSIS — J9622 Acute and chronic respiratory failure with hypercapnia: Secondary | ICD-10-CM | POA: Diagnosis not present

## 2023-07-31 DIAGNOSIS — I130 Hypertensive heart and chronic kidney disease with heart failure and stage 1 through stage 4 chronic kidney disease, or unspecified chronic kidney disease: Secondary | ICD-10-CM | POA: Diagnosis not present

## 2023-07-31 DIAGNOSIS — E1122 Type 2 diabetes mellitus with diabetic chronic kidney disease: Secondary | ICD-10-CM | POA: Diagnosis not present

## 2023-07-31 DIAGNOSIS — M109 Gout, unspecified: Secondary | ICD-10-CM | POA: Diagnosis not present

## 2023-07-31 DIAGNOSIS — Z7982 Long term (current) use of aspirin: Secondary | ICD-10-CM | POA: Diagnosis not present

## 2023-07-31 DIAGNOSIS — Z794 Long term (current) use of insulin: Secondary | ICD-10-CM | POA: Diagnosis not present

## 2023-07-31 DIAGNOSIS — G4700 Insomnia, unspecified: Secondary | ICD-10-CM | POA: Diagnosis not present

## 2023-08-03 DIAGNOSIS — J449 Chronic obstructive pulmonary disease, unspecified: Secondary | ICD-10-CM | POA: Diagnosis not present

## 2023-08-03 DIAGNOSIS — J45909 Unspecified asthma, uncomplicated: Secondary | ICD-10-CM | POA: Diagnosis not present

## 2023-08-03 DIAGNOSIS — R269 Unspecified abnormalities of gait and mobility: Secondary | ICD-10-CM | POA: Diagnosis not present

## 2023-08-03 DIAGNOSIS — G4733 Obstructive sleep apnea (adult) (pediatric): Secondary | ICD-10-CM | POA: Diagnosis not present

## 2023-08-03 DIAGNOSIS — M356 Relapsing panniculitis [Weber-Christian]: Secondary | ICD-10-CM | POA: Diagnosis not present

## 2023-08-04 DIAGNOSIS — J449 Chronic obstructive pulmonary disease, unspecified: Secondary | ICD-10-CM | POA: Diagnosis not present

## 2023-08-04 DIAGNOSIS — G4733 Obstructive sleep apnea (adult) (pediatric): Secondary | ICD-10-CM | POA: Diagnosis not present

## 2023-08-04 DIAGNOSIS — I10 Essential (primary) hypertension: Secondary | ICD-10-CM | POA: Diagnosis not present

## 2023-08-04 DIAGNOSIS — G4730 Sleep apnea, unspecified: Secondary | ICD-10-CM | POA: Diagnosis not present

## 2023-08-06 DIAGNOSIS — I059 Rheumatic mitral valve disease, unspecified: Secondary | ICD-10-CM | POA: Diagnosis not present

## 2023-08-06 DIAGNOSIS — I350 Nonrheumatic aortic (valve) stenosis: Secondary | ICD-10-CM | POA: Diagnosis not present

## 2023-08-06 DIAGNOSIS — E782 Mixed hyperlipidemia: Secondary | ICD-10-CM | POA: Diagnosis not present

## 2023-08-06 DIAGNOSIS — E1122 Type 2 diabetes mellitus with diabetic chronic kidney disease: Secondary | ICD-10-CM | POA: Diagnosis not present

## 2023-08-06 DIAGNOSIS — I2510 Atherosclerotic heart disease of native coronary artery without angina pectoris: Secondary | ICD-10-CM | POA: Diagnosis not present

## 2023-08-06 DIAGNOSIS — J4520 Mild intermittent asthma, uncomplicated: Secondary | ICD-10-CM | POA: Diagnosis not present

## 2023-08-06 DIAGNOSIS — Z7982 Long term (current) use of aspirin: Secondary | ICD-10-CM | POA: Diagnosis not present

## 2023-08-06 DIAGNOSIS — M179 Osteoarthritis of knee, unspecified: Secondary | ICD-10-CM | POA: Diagnosis not present

## 2023-08-06 DIAGNOSIS — J9621 Acute and chronic respiratory failure with hypoxia: Secondary | ICD-10-CM | POA: Diagnosis not present

## 2023-08-06 DIAGNOSIS — I422 Other hypertrophic cardiomyopathy: Secondary | ICD-10-CM | POA: Diagnosis not present

## 2023-08-06 DIAGNOSIS — M109 Gout, unspecified: Secondary | ICD-10-CM | POA: Diagnosis not present

## 2023-08-06 DIAGNOSIS — G4700 Insomnia, unspecified: Secondary | ICD-10-CM | POA: Diagnosis not present

## 2023-08-06 DIAGNOSIS — N1832 Chronic kidney disease, stage 3b: Secondary | ICD-10-CM | POA: Diagnosis not present

## 2023-08-06 DIAGNOSIS — E1169 Type 2 diabetes mellitus with other specified complication: Secondary | ICD-10-CM | POA: Diagnosis not present

## 2023-08-06 DIAGNOSIS — I130 Hypertensive heart and chronic kidney disease with heart failure and stage 1 through stage 4 chronic kidney disease, or unspecified chronic kidney disease: Secondary | ICD-10-CM | POA: Diagnosis not present

## 2023-08-06 DIAGNOSIS — I872 Venous insufficiency (chronic) (peripheral): Secondary | ICD-10-CM | POA: Diagnosis not present

## 2023-08-06 DIAGNOSIS — I442 Atrioventricular block, complete: Secondary | ICD-10-CM | POA: Diagnosis not present

## 2023-08-06 DIAGNOSIS — J9622 Acute and chronic respiratory failure with hypercapnia: Secondary | ICD-10-CM | POA: Diagnosis not present

## 2023-08-06 DIAGNOSIS — Z794 Long term (current) use of insulin: Secondary | ICD-10-CM | POA: Diagnosis not present

## 2023-08-06 DIAGNOSIS — E1151 Type 2 diabetes mellitus with diabetic peripheral angiopathy without gangrene: Secondary | ICD-10-CM | POA: Diagnosis not present

## 2023-08-06 DIAGNOSIS — J4489 Other specified chronic obstructive pulmonary disease: Secondary | ICD-10-CM | POA: Diagnosis not present

## 2023-08-06 DIAGNOSIS — I5043 Acute on chronic combined systolic (congestive) and diastolic (congestive) heart failure: Secondary | ICD-10-CM | POA: Diagnosis not present

## 2023-08-07 ENCOUNTER — Telehealth: Admitting: Family Medicine

## 2023-08-07 DIAGNOSIS — I422 Other hypertrophic cardiomyopathy: Secondary | ICD-10-CM

## 2023-08-07 DIAGNOSIS — I421 Obstructive hypertrophic cardiomyopathy: Secondary | ICD-10-CM | POA: Diagnosis not present

## 2023-08-07 DIAGNOSIS — I5032 Chronic diastolic (congestive) heart failure: Secondary | ICD-10-CM

## 2023-08-07 DIAGNOSIS — F418 Other specified anxiety disorders: Secondary | ICD-10-CM

## 2023-08-07 DIAGNOSIS — M793 Panniculitis, unspecified: Secondary | ICD-10-CM

## 2023-08-07 NOTE — Progress Notes (Signed)
 "   MyChart Video Visit    Virtual Visit via Video Note   This patient is at least at moderate risk for complications without adequate follow up. This format is felt to be most appropriate for this patient at this time. Physical exam was limited by quality of the video and audio technology used for the visit. Powell was able to get the patient set up on a video visit.  Patient location: home alone.    Patient and provider in visit Provider location: Office  I discussed the limitations of evaluation and management by telemedicine and the availability of in person appointments. The patient expressed understanding and agreed to proceed.  Visit Date: 08/07/2023  Today's healthcare provider: [name] [name] Antonio Cyndee, DO     Subjective:    Patient ID: Morgan Fischer, female    DOB: 06-21-1950, 73 y.o.   MRN: [id number]  Chief Complaint  Patient presents with   Depression   Follow-up    HPI Patient is in today for f/u depression.   Discussed the use of AI scribe software for clinical note transcription with the patient, who gave verbal consent to proceed.  History of Present Illness Morgan Fischer is a 73 year old female with hypertrophic cardiomyopathy who presents with weight gain and shortness of breath.  She has experienced a weight gain, currently weighing 280.0 pounds, up from a previous low of 271.8 pounds. Her weight had been decreasing but has recently increased again. She is unsure of the cause as she is eating less than before her hospitalization.  She experiences mild shortness of breath and slight swelling in her legs. She is currently taking furosemide  40 mg once daily, which was restarted recently. Her oxygen  levels at home are usually around 88-89%.  She has a history of hypertrophic cardiomyopathy and sees a cardiologist at [facility] for this condition.  She also experiences depression, which she describes as fluctuating. She is on medication for depression, taking one and a half  pills daily, and feels it helps but takes a few hours to take effect. She has been waiting for a follow-up call regarding a referral for her depression for almost two weeks.  She is also dealing with lymphedema and has been referred to [facility], who are currently visiting her at home. She is awaiting contact from a surgeon in [place] regarding a previous referral for her abdominal issues, which was delayed due to her hospitalization.    Past Medical History:  Diagnosis Date   Acute diastolic heart failure (HCC) 07/17/2019   Acute hypercapnic respiratory failure (HCC) 07/21/2016   Acute on chronic congestive heart failure (HCC) 07/19/2016   Acute on chronic diastolic CHF (congestive heart failure) (HCC) 03/08/2020   Acute on chronic respiratory failure with hypoxia (HCC) 10/20/2018   Acute on chronic respiratory failure with hypoxia and hypercapnia (HCC) 03/08/2020   Acute respiratory acidosis (HCC) 07/19/2016   Acute respiratory distress 07/19/2016   Acute respiratory failure (HCC) 03/08/2020   Anxiety 03/15/2020   Aortic stenosis 11/01/2018   Arthritis    Asthma    Atherosclerosis of native coronary artery of native heart without angina pectoris 03/17/2016   Cardiomyopathy (HCC) 03/15/2020   Chest pain 07/14/2019   CHF (congestive heart failure) (HCC)    Chronic diastolic heart failure (HCC) 03/17/2016   Chronic hypoxemic respiratory failure (HCC)    Chronic kidney disease, stage 3b (HCC) 02/04/2021   CKD (chronic kidney disease)    COPD (chronic obstructive pulmonary disease) (HCC)  COPD with acute exacerbation (HCC) 03/08/2020   Diabetes mellitus without complication (HCC)    Drug allergy 07/21/2016   Essential (primary) hypertension 11/19/2016   Essential hypertension 11/19/2016   Gout 11/19/2016   Heart block AV complete (HCC)    High cholesterol    History of gout 04/05/2023   HOCM (hypertrophic obstructive cardiomyopathy) (HCC) 09/18/2017    Hyperlipidemia associated with type 2 diabetes mellitus (HCC) 11/23/2022   Hyperlipidemia, unspecified 03/15/2020   Hypertension    Hypertensive heart disease with heart failure (HCC) 03/17/2016   Hypertensive heart disease without congestive heart failure 03/15/2020   Hypertrophic cardiomyopathy (HCC) 04/12/2020   Hypokalemia 08/24/2015   Hypomagnesemia    Hypoxia 03/17/2016   Insomnia 11/23/2022   Iron deficiency 03/15/2020   Leukocytosis 07/19/2016   Long term (current) use of insulin  (HCC) 02/04/2021   Mild intermittent asthma 02/04/2021   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   Mixed hyperlipidemia 03/15/2020   Morbid obesity (HCC)    Morbid obesity with BMI of 60.0-69.9, adult (HCC) 07/21/2016   Need for influenza vaccination 11/23/2022   Need for pneumococcal 20-valent conjugate vaccination 11/23/2022   Nonrheumatic aortic valve stenosis 03/17/2016   Obesity hypoventilation syndrome (HCC) 03/08/2020   Obstructive sleep apnea 11/19/2016   Osteoarthritis of knee 03/15/2020   Panniculitis 08/23/2015   Peripheral venous insufficiency 03/15/2020   Pure hypercholesterolemia 02/13/2017   SOB (shortness of breath) 08/24/2015   Troponin level elevated 08/24/2015   Type 2 diabetes mellitus with hyperglycemia (HCC) 07/19/2016   Type 2 diabetes mellitus with stage 3 chronic kidney disease, with long-term current use of insulin  (HCC) 11/19/2016   Type 2 diabetes mellitus without complication, without long-term current use of insulin  (HCC) 04/12/2020   Type 2 diabetes mellitus without complications (HCC) 03/15/2020   Type 2 diabetes mellitus, without long-term current use of insulin  (HCC) 11/19/2016    Past Surgical History:  Procedure Laterality Date   ABDOMINAL HYSTERECTOMY     cellulitis debridement  2000   abdomen   CESAREAN SECTION      Family History  Problem Relation Age of Onset   Diabetes Mellitus I Mother    CAD Mother    Diabetes Mother    Heart disease Father     Cancer Father    Breast cancer Sister    Cancer Sister    Heart disease Sister    Prostate cancer Brother     Social History   Socioeconomic History   Marital status: Widowed    Spouse name: Not on file   Number of children: Not on file   Years of education: Not on file   Highest education level: Bachelor's degree (e.g., BA, AB, BS)  Occupational History   Occupation: retired    Comment: copywriter, 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  Tobacco Use   Smoking status: Never   Smokeless tobacco: Never  Vaping Use   Vaping status: Never Used  Substance and Sexual Activity   Alcohol use: No   Drug use: No   Sexual activity: Never  Other Topics Concern   Not on file  Social History Narrative   Not on file   Social Drivers of Health   Financial Resource Strain: Medium Risk (07/10/2023)   Overall Financial Resource Strain (CARDIA)    Difficulty of Paying Living Expenses: Somewhat hard  Food Insecurity: No Food Insecurity (07/10/2023)   Hunger Vital Sign    Worried About Running Out of Food in the Last Year: Never true    Ran  Out of Food in the Last Year: Never true  Transportation Needs: No Transportation Needs (07/10/2023)   PRAPARE - Administrator, Civil Service (Medical): No    Lack of Transportation (Non-Medical): No  Physical Activity: Inactive (07/10/2023)   Exercise Vital Sign    Days of Exercise per Week: 0 days    Minutes of Exercise per Session: Not on file  Stress: Stress Concern Present (07/10/2023)   [employer] of Occupational Health - Occupational Stress Questionnaire    Feeling of Stress: To some extent  Social Connections: Moderately Integrated (07/10/2023)   Social Connection and Isolation Panel    Frequency of Communication with Friends and Family: More than three times a week    Frequency of Social Gatherings with Friends and Family: Once a week    Attends Religious Services: More than 4 times per year    Active Member of [employer] or  Organizations: Yes    Attends Banker Meetings: More than 4 times per year    Marital Status: Widowed  Intimate Partner Violence: Not At Risk (02/13/2023)   Humiliation, Afraid, Rape, and Kick questionnaire    Fear of Current or Ex-Partner: No    Emotionally Abused: No    Physically Abused: No    Sexually Abused: No    Outpatient Medications Prior to Visit  Medication Sig Dispense Refill   allopurinol  (ZYLOPRIM ) 300 MG tablet Take 100 mg by mouth daily.     aspirin  EC 81 MG EC tablet Take 1 tablet (81 mg total) by mouth daily. 120 tablet 0   Cholecalciferol  50 MCG (2000 UT) CAPS Take 2,000 Units by mouth daily.     Cyanocobalamin (VITAMIN B-12 PO) Take 1 tablet by mouth daily.     dapagliflozin propanediol (FARXIGA) 10 MG TABS tablet Take 10 mg by mouth daily.     diclofenac  Sodium (VOLTAREN ) 1 % GEL Apply 1 application topically as needed for pain (pain).     disopyramide  (NORPACE ) 100 MG capsule 1 po tid     ferrous sulfate  325 (65 FE) MG EC tablet Take 1 tablet by mouth daily.     fluticasone  furoate-vilanterol (BREO ELLIPTA ) 100-25 MCG/ACT AEPB Inhale 1 puff into the lungs daily.     furosemide  (LASIX ) 40 MG tablet Take 1 tablet (40 mg total) by mouth daily.     gabapentin  (NEURONTIN ) 100 MG capsule Two tabs daily 180 capsule 1   Garlic 1000 MG CAPS Take 1,000 mg by mouth daily.     ipratropium-albuterol  (DUONEB) 0.5-2.5 (3) MG/3ML SOLN Inhale 3 mLs into the lungs every 4 (four) hours as needed for wheezing (wheezing).     JANUVIA 25 MG tablet Take 25 mg by mouth daily.     Lactobacillus [facility] WOMEN) CAPS Take 1 capsule by mouth daily as needed.     LANTUS  SOLOSTAR 100 UNIT/ML Solostar Pen Inject 26 Units into the skin at bedtime.     metoprolol  succinate (TOPROL -XL) 50 MG 24 hr tablet TAKE 1 TABLET(50 MG) BY MOUTH DAILY 90 tablet 2   montelukast  (SINGULAIR ) 10 MG tablet Take 1 tablet (10 mg total) by mouth at bedtime. 90 tablet 1   Multiple Vitamin (MULTIVITAMIN  WITH MINERALS) TABS tablet Take 1 tablet by mouth daily.     Omega-3 Fatty Acids (FISH OIL) 1000 MG CAPS Take 1,000 mg by mouth 2 (two) times daily.      OXYGEN  Inhale 2 L into the lungs continuous.     polyethylene glycol (MIRALAX  /  GLYCOLAX ) 17 g packet Take 17 g by mouth as needed for mild constipation.     Probiotic Product (PROBIOTIC PO) Take 1 tablet by mouth daily in the afternoon.     rosuvastatin  (CRESTOR ) 10 MG tablet Take 1 tablet (10 mg total) by mouth daily. 90 tablet 1   sertraline  (ZOLOFT ) 100 MG tablet 1 1/2 tab po every day 45 tablet 3   Suvorexant  (BELSOMRA ) 20 MG TABS TAKE 1 TABLET(20 MG) BY MOUTH AT BEDTIME AS NEEDED 30 tablet 2   Turmeric 500 MG CAPS Take 1 capsule by mouth daily.     VENTOLIN  HFA 108 (90 Base) MCG/ACT inhaler Inhale 2 puffs into the lungs every 6 (six) hours as needed for wheezing or shortness of breath.      No facility-administered medications prior to visit.    Allergies  Allergen Reactions   Atorvastatin Anaphylaxis and Swelling    Lip swelling    Ciprofloxacin Anaphylaxis    Kidney failure   Ciprocinonide [Fluocinolone] Other (See Comments)    Kidney failure   Catapres [Clonidine Hcl] Rash   Clonidine Rash   Demadex [Torsemide] Rash    Review of Systems  Constitutional:  Negative for fever and malaise/fatigue.  HENT:  Negative for congestion.   Eyes:  Negative for blurred vision.  Respiratory:  Negative for shortness of breath.   Cardiovascular:  Negative for chest pain, palpitations and leg swelling.  Gastrointestinal:  Negative for abdominal pain, blood in stool and nausea.  Genitourinary:  Negative for dysuria and frequency.  Musculoskeletal:  Negative for falls.  Skin:  Negative for rash.  Neurological:  Negative for dizziness, loss of consciousness and headaches.  Endo/Heme/Allergies:  Negative for environmental allergies.  Psychiatric/Behavioral:  Negative for depression. The patient is not nervous/anxious.         Objective:    Physical Exam Vitals and nursing note reviewed.  Constitutional:      General: She is not in acute distress.    Appearance: Normal appearance. She is well-developed.  HENT:     Head: Normocephalic and atraumatic.  Eyes:     General: No scleral icterus.       Right eye: No discharge.        Left eye: No discharge.  Cardiovascular:     Heart sounds: No murmur heard. Pulmonary:     Effort: Pulmonary effort is normal. No respiratory distress.     Breath sounds: Normal breath sounds.  Musculoskeletal:     Cervical back: Normal range of motion and neck supple.  Skin:    General: Skin is warm and dry.  Neurological:     Mental Status: She is alert and oriented to person, place, and time.  Psychiatric:        Mood and Affect: Mood normal.        Behavior: Behavior normal.        Thought Content: Thought content normal.        Judgment: Judgment normal.     LMP  (LMP Unknown)  Wt Readings from Last 3 Encounters:  07/10/23 278 lb 9.6 oz (126.4 kg)  05/11/23 281 lb 8 oz (127.7 kg)  05/02/23 (!) 302 lb 6.4 oz (137.2 kg)       Assessment & Plan:  Chronic diastolic heart failure (HCC)  Panniculitis -     Ambulatory referral to Plastic Surgery  Depression with anxiety   Assessment and Plan Assessment & Plan Hypertrophic cardiomyopathy with volume overload and lower extremity edema  She has hypertrophic cardiomyopathy with recent weight gain of 9 pounds, mild lower extremity edema, and intermittent dyspnea. Her home oxygen  saturation is 88-89%. Currently on furosemide  40 mg daily, which was initiated by the cardiologist at [facility] due to weight gain and decreased oxygen  levels. Contact the cardiologist at [facility] to report weight gain and discuss potential adjustment of furosemide  dosage. Continue furosemide  40 mg daily. Monitor weight and report significant changes to the cardiologist. Attend the upcoming appointment with the nephrologist for blood work to assess renal  function.  Depression   She experiences depression with fluctuating symptoms. Current medication provides some relief but has a delayed onset of action. Awaiting contact from mental health services for further evaluation and management. Ask the referral coordinator to follow up on the mental health referral. Continue the current depression medication regimen.  Panniculitis   . A previous referral to plastic surgery was closed due to hospitalization. She seeks to reopen the referral for further management options without surgery. Contact [facility] plastic surgery to reopen the referral and schedule an appointment.    I discussed the assessment and treatment plan with the patient. The patient was provided an opportunity to ask questions and all were answered. The patient agreed with the plan and demonstrated an understanding of the instructions.   The patient was advised to call back or seek an in-person evaluation if the symptoms worsen or if the condition fails to improve as anticipated.  [name] [name] Antonio Cyndee, DO [facility] [phone] (phone) [phone] (fax)  [facility]  "

## 2023-08-12 NOTE — Assessment & Plan Note (Signed)
 Per cardiology

## 2023-08-12 NOTE — Assessment & Plan Note (Signed)
 Per cardiology at [facility]

## 2023-08-14 ENCOUNTER — Other Ambulatory Visit: Payer: Self-pay | Admitting: Family Medicine

## 2023-08-14 DIAGNOSIS — E1151 Type 2 diabetes mellitus with diabetic peripheral angiopathy without gangrene: Secondary | ICD-10-CM | POA: Diagnosis not present

## 2023-08-14 DIAGNOSIS — J9622 Acute and chronic respiratory failure with hypercapnia: Secondary | ICD-10-CM | POA: Diagnosis not present

## 2023-08-14 DIAGNOSIS — I350 Nonrheumatic aortic (valve) stenosis: Secondary | ICD-10-CM | POA: Diagnosis not present

## 2023-08-14 DIAGNOSIS — Z7982 Long term (current) use of aspirin: Secondary | ICD-10-CM | POA: Diagnosis not present

## 2023-08-14 DIAGNOSIS — I5043 Acute on chronic combined systolic (congestive) and diastolic (congestive) heart failure: Secondary | ICD-10-CM | POA: Diagnosis not present

## 2023-08-14 DIAGNOSIS — J4520 Mild intermittent asthma, uncomplicated: Secondary | ICD-10-CM | POA: Diagnosis not present

## 2023-08-14 DIAGNOSIS — I2510 Atherosclerotic heart disease of native coronary artery without angina pectoris: Secondary | ICD-10-CM | POA: Diagnosis not present

## 2023-08-14 DIAGNOSIS — I422 Other hypertrophic cardiomyopathy: Secondary | ICD-10-CM | POA: Diagnosis not present

## 2023-08-14 DIAGNOSIS — Z794 Long term (current) use of insulin: Secondary | ICD-10-CM | POA: Diagnosis not present

## 2023-08-14 DIAGNOSIS — M179 Osteoarthritis of knee, unspecified: Secondary | ICD-10-CM | POA: Diagnosis not present

## 2023-08-14 DIAGNOSIS — I130 Hypertensive heart and chronic kidney disease with heart failure and stage 1 through stage 4 chronic kidney disease, or unspecified chronic kidney disease: Secondary | ICD-10-CM | POA: Diagnosis not present

## 2023-08-14 DIAGNOSIS — I872 Venous insufficiency (chronic) (peripheral): Secondary | ICD-10-CM | POA: Diagnosis not present

## 2023-08-14 DIAGNOSIS — N1832 Chronic kidney disease, stage 3b: Secondary | ICD-10-CM | POA: Diagnosis not present

## 2023-08-14 DIAGNOSIS — E1122 Type 2 diabetes mellitus with diabetic chronic kidney disease: Secondary | ICD-10-CM | POA: Diagnosis not present

## 2023-08-14 DIAGNOSIS — E1169 Type 2 diabetes mellitus with other specified complication: Secondary | ICD-10-CM | POA: Diagnosis not present

## 2023-08-14 DIAGNOSIS — J9621 Acute and chronic respiratory failure with hypoxia: Secondary | ICD-10-CM | POA: Diagnosis not present

## 2023-08-14 DIAGNOSIS — M109 Gout, unspecified: Secondary | ICD-10-CM | POA: Diagnosis not present

## 2023-08-14 DIAGNOSIS — I442 Atrioventricular block, complete: Secondary | ICD-10-CM | POA: Diagnosis not present

## 2023-08-14 DIAGNOSIS — I059 Rheumatic mitral valve disease, unspecified: Secondary | ICD-10-CM | POA: Diagnosis not present

## 2023-08-14 DIAGNOSIS — E782 Mixed hyperlipidemia: Secondary | ICD-10-CM | POA: Diagnosis not present

## 2023-08-14 DIAGNOSIS — G4700 Insomnia, unspecified: Secondary | ICD-10-CM | POA: Diagnosis not present

## 2023-08-14 DIAGNOSIS — J4489 Other specified chronic obstructive pulmonary disease: Secondary | ICD-10-CM | POA: Diagnosis not present

## 2023-08-16 ENCOUNTER — Ambulatory Visit: Payer: Self-pay | Admitting: Family Medicine

## 2023-08-16 ENCOUNTER — Encounter: Payer: Self-pay | Admitting: Family Medicine

## 2023-08-16 ENCOUNTER — Ambulatory Visit (HOSPITAL_BASED_OUTPATIENT_CLINIC_OR_DEPARTMENT_OTHER)
Admission: RE | Admit: 2023-08-16 | Discharge: 2023-08-16 | Disposition: A | Discharge status: Home / Self Care | Source: Ambulatory Visit | Attending: Family Medicine | Admitting: Family Medicine

## 2023-08-16 ENCOUNTER — Ambulatory Visit: Admitting: Family Medicine

## 2023-08-16 VITALS — BP 138/78 | HR 62 | Temp 97.9°F | Resp 20 | Ht 63.0 in | Wt 296.8 lb

## 2023-08-16 DIAGNOSIS — I442 Atrioventricular block, complete: Secondary | ICD-10-CM

## 2023-08-16 DIAGNOSIS — R0602 Shortness of breath: Secondary | ICD-10-CM | POA: Insufficient documentation

## 2023-08-16 DIAGNOSIS — I422 Other hypertrophic cardiomyopathy: Secondary | ICD-10-CM

## 2023-08-16 DIAGNOSIS — I517 Cardiomegaly: Secondary | ICD-10-CM | POA: Diagnosis not present

## 2023-08-16 DIAGNOSIS — I5032 Chronic diastolic (congestive) heart failure: Secondary | ICD-10-CM | POA: Diagnosis not present

## 2023-08-16 DIAGNOSIS — E1169 Type 2 diabetes mellitus with other specified complication: Secondary | ICD-10-CM

## 2023-08-16 DIAGNOSIS — R918 Other nonspecific abnormal finding of lung field: Secondary | ICD-10-CM | POA: Diagnosis not present

## 2023-08-16 DIAGNOSIS — J42 Unspecified chronic bronchitis: Secondary | ICD-10-CM | POA: Insufficient documentation

## 2023-08-16 DIAGNOSIS — E1122 Type 2 diabetes mellitus with diabetic chronic kidney disease: Secondary | ICD-10-CM

## 2023-08-16 DIAGNOSIS — I10 Essential (primary) hypertension: Secondary | ICD-10-CM

## 2023-08-16 DIAGNOSIS — E785 Hyperlipidemia, unspecified: Secondary | ICD-10-CM | POA: Diagnosis not present

## 2023-08-16 DIAGNOSIS — Z794 Long term (current) use of insulin: Secondary | ICD-10-CM

## 2023-08-16 DIAGNOSIS — R0989 Other specified symptoms and signs involving the circulatory and respiratory systems: Secondary | ICD-10-CM | POA: Diagnosis not present

## 2023-08-16 DIAGNOSIS — N1832 Chronic kidney disease, stage 3b: Secondary | ICD-10-CM

## 2023-08-16 DIAGNOSIS — J9611 Chronic respiratory failure with hypoxia: Secondary | ICD-10-CM

## 2023-08-16 LAB — LIPID PANEL
Cholesterol: 144 mg/dL (ref 0–200)
HDL: 37.9 mg/dL — ABNORMAL LOW
LDL Cholesterol: 87 mg/dL (ref 0–99)
NonHDL: 106.1
Total CHOL/HDL Ratio: 4
Triglycerides: 94 mg/dL (ref 0.0–149.0)
VLDL: 18.8 mg/dL (ref 0.0–40.0)

## 2023-08-16 LAB — COMPREHENSIVE METABOLIC PANEL WITH GFR
ALT: 14 U/L (ref 0–35)
AST: 16 U/L (ref 0–37)
Albumin: 4 g/dL (ref 3.5–5.2)
Alkaline Phosphatase: 54 U/L (ref 39–117)
BUN: 45 mg/dL — ABNORMAL HIGH (ref 6–23)
CO2: 41 meq/L — ABNORMAL HIGH (ref 19–32)
Calcium: 8.9 mg/dL (ref 8.4–10.5)
Chloride: 97 meq/L (ref 96–112)
Creatinine, Ser: 1.36 mg/dL — ABNORMAL HIGH (ref 0.40–1.20)
GFR: 38.3 mL/min — ABNORMAL LOW
Glucose, Bld: 114 mg/dL — ABNORMAL HIGH (ref 70–99)
Potassium: 3.9 meq/L (ref 3.5–5.1)
Sodium: 146 meq/L — ABNORMAL HIGH (ref 135–145)
Total Bilirubin: 0.4 mg/dL (ref 0.2–1.2)
Total Protein: 6.1 g/dL (ref 6.0–8.3)

## 2023-08-16 LAB — BRAIN NATRIURETIC PEPTIDE: Pro B Natriuretic peptide (BNP): 1497 pg/mL — ABNORMAL HIGH (ref 0.0–100.0)

## 2023-08-16 MED ORDER — FLUTICASONE FUROATE-VILANTEROL 200-25 MCG/ACT IN AEPB: 1.0000 | INHALATION_SPRAY | Freq: Every day | RESPIRATORY_TRACT | 11 refills | Status: DC

## 2023-08-16 NOTE — Progress Notes (Signed)
 Subjective:    Patient ID: Morgan Fischer, female    DOB: [birth year], 73 y.o.   MRN: [id number]  Chief Complaint  Patient presents with   Diabetes   Follow-up    HPI Patient is in today for f/u weight , dm and breathing .   Discussed the use of AI scribe software for clinical note transcription with the patient, who gave verbal consent to proceed.  History of Present Illness [name] is a 73 year old female with COPD and heart failure who presents with worsening shortness of breath and weight fluctuations.  She experiences worsening shortness of breath, particularly during physical activities, accompanied by anxiety and shaking. She uses a rescue inhaler, which provides some relief, and is currently on 3 liters of oxygen . Increasing the oxygen  flow causes discomfort, such as burning and drying out her nose.  She describes fluctuations in her weight, with recent measurements showing an increase. Discrepancies between home and clinic scales have been noted. She reports swelling in her foot and ankle. She is currently taking furosemide  40 mg daily, but it is not causing the usual diuretic effect.  Her blood sugar levels have been stable, with a recent reading of 128 mg/dL and an J8r of 3.0%. She continues to see an endocrinologist for management.  She has a history of depression, but reports no improvement in her mood since her last visit. She has not received any communication from her mental health provider despite previous messages being sent.  No chest pain and no significant issues with sleep, stating she does not wake up frequently at night.    Past Medical History:  Diagnosis Date   Acute diastolic heart failure (HCC) 07/17/2019   Acute hypercapnic respiratory failure (HCC) 07/21/2016   Acute on chronic congestive heart failure (HCC) 07/19/2016   Acute on chronic diastolic CHF (congestive heart failure) (HCC) 03/08/2020   Acute on chronic respiratory failure with hypoxia  (HCC) 10/20/2018   Acute on chronic respiratory failure with hypoxia and hypercapnia (HCC) 03/08/2020   Acute respiratory acidosis (HCC) 07/19/2016   Acute respiratory distress 07/19/2016   Acute respiratory failure (HCC) 03/08/2020   Anxiety 03/15/2020   Aortic stenosis 11/01/2018   Arthritis    Asthma    Atherosclerosis of native coronary artery of native heart without angina pectoris 03/17/2016   Cardiomyopathy (HCC) 03/15/2020   Chest pain 07/14/2019   CHF (congestive heart failure) (HCC)    Chronic diastolic heart failure (HCC) 03/17/2016   Chronic hypoxemic respiratory failure (HCC)    Chronic kidney disease, stage 3b (HCC) 02/04/2021   CKD (chronic kidney disease)    COPD (chronic obstructive pulmonary disease) (HCC)    COPD with acute exacerbation (HCC) 03/08/2020   Diabetes mellitus without complication (HCC)    Drug allergy 07/21/2016   Essential (primary) hypertension 11/19/2016   Essential hypertension 11/19/2016   Gout 11/19/2016   Heart block AV complete (HCC)    High cholesterol    History of gout 04/05/2023   HOCM (hypertrophic obstructive cardiomyopathy) (HCC) 09/18/2017   Hyperlipidemia associated with type 2 diabetes mellitus (HCC) 11/23/2022   Hyperlipidemia, unspecified 03/15/2020   Hypertension    Hypertensive heart disease with heart failure (HCC) 03/17/2016   Hypertensive heart disease without congestive heart failure 03/15/2020   Hypertrophic cardiomyopathy (HCC) 04/12/2020   Hypokalemia 08/24/2015   Hypomagnesemia    Hypoxia 03/17/2016   Insomnia 11/23/2022   Iron deficiency 03/15/2020   Leukocytosis 07/19/2016   Long term (current) use of insulin  (HCC)  02/04/2021   Mild intermittent asthma 02/04/2021   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   Mixed hyperlipidemia 03/15/2020   Morbid obesity (HCC)    Morbid obesity with BMI of 60.0-69.9, adult (HCC) 07/21/2016   Need for influenza vaccination 11/23/2022   Need for pneumococcal 20-valent conjugate  vaccination 11/23/2022   Nonrheumatic aortic valve stenosis 03/17/2016   Obesity hypoventilation syndrome (HCC) 03/08/2020   Obstructive sleep apnea 11/19/2016   Osteoarthritis of knee 03/15/2020   Panniculitis 08/23/2015   Peripheral venous insufficiency 03/15/2020   Pure hypercholesterolemia 02/13/2017   SOB (shortness of breath) 08/24/2015   Troponin level elevated 08/24/2015   Type 2 diabetes mellitus with hyperglycemia (HCC) 07/19/2016   Type 2 diabetes mellitus with stage 3 chronic kidney disease, with long-term current use of insulin  (HCC) 11/19/2016   Type 2 diabetes mellitus without complication, without long-term current use of insulin  (HCC) 04/12/2020   Type 2 diabetes mellitus without complications (HCC) 03/15/2020   Type 2 diabetes mellitus, without long-term current use of insulin  (HCC) 11/19/2016    Past Surgical History:  Procedure Laterality Date   ABDOMINAL HYSTERECTOMY     cellulitis debridement  2000   abdomen   CESAREAN SECTION      Family History  Problem Relation Age of Onset   Diabetes Mellitus I Mother    CAD Mother    Diabetes Mother    Heart disease Father    Cancer Father    Breast cancer Sister    Cancer Sister    Heart disease Sister    Prostate cancer Brother     Social History   Socioeconomic History   Marital status: Widowed    Spouse name: Not on file   Number of children: Not on file   Years of education: Not on file   Highest education level: Bachelor's degree (e.g., BA, AB, BS)  Occupational History   Occupation: retired    Comment: Copywriter, 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  Tobacco Use   Smoking status: Never   Smokeless tobacco: Never  Vaping Use   Vaping status: Never Used  Substance and Sexual Activity   Alcohol use: No   Drug use: No   Sexual activity: Never  Other Topics Concern   Not on file  Social History Narrative   Not on file   Social Drivers of Health   Financial Resource Strain: Medium Risk  (07/10/2023)   Overall Financial Resource Strain (CARDIA)    Difficulty of Paying Living Expenses: Somewhat hard  Food Insecurity: No Food Insecurity (07/10/2023)   Hunger Vital Sign    Worried About Running Out of Food in the Last Year: Never true    Ran Out of Food in the Last Year: Never true  Transportation Needs: No Transportation Needs (07/10/2023)   PRAPARE - Administrator, Civil Service (Medical): No    Lack of Transportation (Non-Medical): No  Physical Activity: Inactive (07/10/2023)   Exercise Vital Sign    Days of Exercise per Week: 0 days    Minutes of Exercise per Session: Not on file  Stress: Stress Concern Present (07/10/2023)   [employer] of Occupational Health - Occupational Stress Questionnaire    Feeling of Stress: To some extent  Social Connections: Moderately Integrated (07/10/2023)   Social Connection and Isolation Panel    Frequency of Communication with Friends and Family: More than three times a week    Frequency of Social Gatherings with Friends and Family: Once a week    Attends  Religious Services: More than 4 times per year    Active Member of Clubs or Organizations: Yes    Attends Banker Meetings: More than 4 times per year    Marital Status: Widowed  Intimate Partner Violence: Not At Risk (02/13/2023)   Humiliation, Afraid, Rape, and Kick questionnaire    Fear of Current or Ex-Partner: No    Emotionally Abused: No    Physically Abused: No    Sexually Abused: No    Outpatient Medications Prior to Visit  Medication Sig Dispense Refill   allopurinol  (ZYLOPRIM ) 300 MG tablet Take 100 mg by mouth daily.     aspirin  EC 81 MG EC tablet Take 1 tablet (81 mg total) by mouth daily. 120 tablet 0   Cholecalciferol  50 MCG (2000 UT) CAPS Take 2,000 Units by mouth daily.     Cyanocobalamin (VITAMIN B-12 PO) Take 1 tablet by mouth daily.     dapagliflozin propanediol (FARXIGA) 10 MG TABS tablet Take 10 mg by mouth daily.     diclofenac Sodium  (VOLTAREN) 1 % GEL Apply 1 application topically as needed for pain (pain).     disopyramide  (NORPACE ) 100 MG capsule 1 po tid     ferrous sulfate  325 (65 FE) MG EC tablet Take 1 tablet by mouth daily.     fluticasone  furoate-vilanterol (BREO ELLIPTA ) 100-25 MCG/ACT AEPB Inhale 1 puff into the lungs daily.     furosemide  (LASIX ) 40 MG tablet Take 1 tablet (40 mg total) by mouth daily.     gabapentin  (NEURONTIN ) 100 MG capsule Two tabs daily 180 capsule 1   Garlic 1000 MG CAPS Take 1,000 mg by mouth daily.     ipratropium-albuterol  (DUONEB) 0.5-2.5 (3) MG/3ML SOLN Inhale 3 mLs into the lungs every 4 (four) hours as needed for wheezing (wheezing).     JANUVIA 25 MG tablet Take 25 mg by mouth daily.     Lactobacillus [facility] WOMEN) CAPS Take 1 capsule by mouth daily as needed.     LANTUS  SOLOSTAR 100 UNIT/ML Solostar Pen Inject 26 Units into the skin at bedtime.     metoprolol  succinate (TOPROL -XL) 50 MG 24 hr tablet TAKE 1 TABLET(50 MG) BY MOUTH DAILY 90 tablet 2   montelukast  (SINGULAIR ) 10 MG tablet Take 1 tablet (10 mg total) by mouth at bedtime. 90 tablet 1   Multiple Vitamin (MULTIVITAMIN WITH MINERALS) TABS tablet Take 1 tablet by mouth daily.     Omega-3 Fatty Acids (FISH OIL) 1000 MG CAPS Take 1,000 mg by mouth 2 (two) times daily.      OXYGEN  Inhale 2 L into the lungs continuous.     polyethylene glycol (MIRALAX  / GLYCOLAX ) 17 g packet Take 17 g by mouth as needed for mild constipation.     Probiotic Product (PROBIOTIC PO) Take 1 tablet by mouth daily in the afternoon.     rosuvastatin  (CRESTOR ) 10 MG tablet Take 1 tablet (10 mg total) by mouth daily. 90 tablet 1   sertraline  (ZOLOFT ) 100 MG tablet 1 1/2 tab po every day 45 tablet 3   Suvorexant  (BELSOMRA ) 20 MG TABS TAKE 1 TABLET(20 MG) BY MOUTH AT BEDTIME AS NEEDED 30 tablet 2   Turmeric 500 MG CAPS Take 1 capsule by mouth daily.     VENTOLIN  HFA 108 (90 Base) MCG/ACT inhaler Inhale 2 puffs into the lungs every 6 (six) hours as  needed for wheezing or shortness of breath.      No facility-administered medications prior to visit.  Allergies  Allergen Reactions   Atorvastatin Anaphylaxis and Swelling    Lip swelling    Ciprofloxacin Anaphylaxis    Kidney failure   Ciprocinonide [Fluocinolone] Other (See Comments)    Kidney failure   Catapres [Clonidine Hcl] Rash   Clonidine Rash   Demadex [Torsemide] Rash    Review of Systems  Constitutional:  Negative for fever and malaise/fatigue.  HENT:  Negative for congestion.   Eyes:  Negative for blurred vision.  Respiratory:  Positive for shortness of breath.   Cardiovascular:  Positive for leg swelling. Negative for chest pain and palpitations.  Gastrointestinal:  Negative for abdominal pain, blood in stool and nausea.  Genitourinary:  Negative for dysuria and frequency.  Musculoskeletal:  Negative for falls.  Skin:  Negative for rash.  Neurological:  Negative for dizziness, loss of consciousness and headaches.  Endo/Heme/Allergies:  Negative for environmental allergies.  Psychiatric/Behavioral:  Negative for depression. The patient is not nervous/anxious.        Objective:    Physical Exam Vitals and nursing note reviewed.  Constitutional:      General: She is not in acute distress.    Appearance: Normal appearance. She is well-developed.  HENT:     Head: Normocephalic and atraumatic.  Eyes:     General: No scleral icterus.       Right eye: No discharge.        Left eye: No discharge.  Cardiovascular:     Rate and Rhythm: Normal rate and regular rhythm.     Heart sounds: No murmur heard. Pulmonary:     Effort: Pulmonary effort is normal. No respiratory distress.     Breath sounds: Decreased air movement present. Decreased breath sounds present.  Musculoskeletal:        General: Normal range of motion.     Cervical back: Normal range of motion and neck supple.     Right lower leg: 1+ Pitting Edema present.     Left lower leg: 2+ Pitting  Edema present.  Skin:    General: Skin is warm and dry.  Neurological:     Mental Status: She is alert and oriented to person, place, and time.  Psychiatric:        Mood and Affect: Mood normal.        Behavior: Behavior normal.        Thought Content: Thought content normal.        Judgment: Judgment normal.     BP 138/78 (BP Location: Left Arm, Patient Position: Sitting, Cuff Size: Large)   Pulse 62   Temp 97.9 F (36.6 C) (Oral)   Resp 20   Ht 5' 3 (1.6 m)   Wt 296 lb 12.8 oz (134.6 kg)   LMP  (LMP Unknown)   SpO2 94%   BMI 52.58 kg/m  Wt Readings from Last 3 Encounters:  08/16/23 296 lb 12.8 oz (134.6 kg)  07/10/23 278 lb 9.6 oz (126.4 kg)  05/11/23 281 lb 8 oz (127.7 kg)    Diabetic Foot Exam - Simple   No data filed    Lab Results  Component Value Date   WBC 7.7 11/23/2022   HGB 11.3 (L) 11/23/2022   HCT 36.4 11/23/2022   PLT 148.0 (L) 11/23/2022   GLUCOSE 82 05/11/2023   CHOL 153 02/27/2023   TRIG 106.0 02/27/2023   HDL 37.30 (L) 02/27/2023   LDLCALC 94 02/27/2023   ALT 18 05/11/2023   AST 16 05/11/2023   NA 144 05/11/2023  K 3.8 05/11/2023   CL 97 05/11/2023   CREATININE 1.27 (H) 05/11/2023   BUN 31 (H) 05/11/2023   CO2 39 (H) 05/11/2023   TSH 0.662 03/09/2020   INR 1.0 03/08/2020   HGBA1C 7.3 04/03/2023    Lab Results  Component Value Date   TSH 0.662 03/09/2020   Lab Results  Component Value Date   WBC 7.7 11/23/2022   HGB 11.3 (L) 11/23/2022   HCT 36.4 11/23/2022   MCV 89.9 11/23/2022   PLT 148.0 (L) 11/23/2022   Lab Results  Component Value Date   NA 144 05/11/2023   K 3.8 05/11/2023   CO2 39 (H) 05/11/2023   GLUCOSE 82 05/11/2023   BUN 31 (H) 05/11/2023   CREATININE 1.27 (H) 05/11/2023   BILITOT 0.5 05/11/2023   ALKPHOS 56 05/11/2023   AST 16 05/11/2023   ALT 18 05/11/2023   PROT 6.0 05/11/2023   ALBUMIN 4.1 05/11/2023   CALCIUM  9.0 05/11/2023   ANIONGAP 12 03/12/2020   EGFR 28 (L) 10/11/2022   GFR 41.66 (L)  05/11/2023   Lab Results  Component Value Date   CHOL 153 02/27/2023   Lab Results  Component Value Date   HDL 37.30 (L) 02/27/2023   Lab Results  Component Value Date   LDLCALC 94 02/27/2023   Lab Results  Component Value Date   TRIG 106.0 02/27/2023   Lab Results  Component Value Date   CHOLHDL 4 02/27/2023   Lab Results  Component Value Date   HGBA1C 7.3 04/03/2023       Assessment & Plan:  Hypertrophic cardiomyopathy (HCC)  SOB (shortness of breath) -     DG Chest 2 View; Future  Chronic kidney disease, stage 3b (HCC)  Chronic hypoxemic respiratory failure (HCC)  Type 2 diabetes mellitus with stage 3 chronic kidney disease, with long-term current use of insulin , unspecified whether stage 3a or 3b CKD (HCC)  Primary hypertension -     Comprehensive metabolic panel with GFR -     Lipid panel  Hyperlipidemia associated with type 2 diabetes mellitus (HCC) -     Comprehensive metabolic panel with GFR -     Lipid panel  Chronic diastolic heart failure (HCC) -     Brain natriuretic peptide  Chronic bronchitis, unspecified chronic bronchitis type (HCC) -     Fluticasone  Furoate-Vilanterol; Inhale 1 puff into the lungs daily.  Dispense: 1 each; Refill: 11 -     DG Chest 2 View; Future  Assessment and Plan Assessment & Plan Congestive heart failure with volume overload   Symptoms of congestive heart failure are worsening, indicated by increased dyspnea, peripheral edema, and weight fluctuations. The current furosemide  dosage is insufficient, showing reduced diuretic effect. Pulse oximetry fluctuates, with a recent reading of 94%, raising concern for fluid buildup and heart failure exacerbation. Increase furosemide  to two tablets per day and inform Dr. Cesario about the dosage change and symptoms. Order a chest x-ray and blood work, including BNP, to assess for exacerbation. Advise taking both doses of furosemide  in the morning to avoid nocturia. Discuss the  possibility of emergency room evaluation if symptoms worsen or lab results indicate significant exacerbation.  Chronic obstructive pulmonary disease (COPD) with chronic hypoxemia requiring supplemental oxygen    COPD with chronic hypoxemia requires supplemental oxygen  at 3 L/min. She reports worsening dyspnea and difficulty speaking in full sentences. The current Breo dosage may be insufficient, with increased use of the rescue inhaler. There is concern for potential exacerbation of  COPD symptoms. Increase Breo dosage to 200 mcg and use the rescue inhaler as needed. Monitor oxygen  saturation and adjust oxygen  flow as needed, avoiding excessive flow that could worsen symptoms. Use a nebulizer at home if needed.  Type 2 diabetes mellitus   Type 2 diabetes mellitus is well-controlled, with recent blood glucose readings around 128 mg/dL and an J8r of 3.0%.  I personally spent a total of 45 minutes in the care of the patient today including preparing to see the patient, getting/reviewing separately obtained history, performing a medically appropriate exam/evaluation, counseling and educating, placing orders, documenting clinical information in the EHR, and coordinating care.   Daci Stubbe R Lowne [name], DO

## 2023-08-16 NOTE — Patient Instructions (Signed)
 Heart Failure: Eating Plan Heart failure is a long-term condition where the heart can't pump enough blood through the body. When this happens, parts of the body don't get the blood and oxygen  they need. Living with heart failure can be hard. But a healthy lifestyle and choosing the right foods may help to improve your symptoms. If you have heart failure, your eating plan will include limiting the amount of salt, also called sodium, and unhealthy fats you eat. What are tips for following this plan? Reading food labels Check food labels for the amount of sodium per serving. Choose foods that have less than 140 mg (milligrams) of sodium in each serving. Check food labels for the number of calories per serving. This is important if you need to limit your daily calorie intake to lose weight. Check food labels for the serving size. If you eat more than one serving, you'll be eating more sodium and calories than what's listed on the label. Look for foods with the words sodium-free, very low sodium, or low sodium on the package. Foods labeled as reduced sodium, lightly salted, or no salt added may still have more sodium than what's recommended for you. Cooking Avoid adding salt when cooking. Before using any salt substitutes, talk with your health care provider or an expert in healthy eating called a dietitian. Season food with salt-free seasonings, spices, or herbs. Check the label of seasoning mixes to make sure they don't contain salt. Cook with heart-healthy oils, such as olive, canola, soybean, or sunflower oil. Do not fry foods. Cook foods using low-fat methods, like baking, boiling, grilling, and broiling. Limit unhealthy fats when cooking. To do this: Remove the skin from poultry, such as chicken. Remove all the fat you can see on meats. Skim the fat off from stews, soups, and gravies before serving them. Meal planning  Limit your intake of: Processed, canned, or prepackaged  foods. Foods that are high in trans fat, such as fried foods. Sweets, desserts, sugary drinks, and other foods with added sugar. Full-fat dairy products, such as whole milk. Eat a balanced diet. This may include: 4-5 servings of fruit each day and 4-5 servings of vegetables each day. At each meal, try to fill one-half of your plate with fruits and vegetables. Up to 6-8 servings of whole grains each day. Up to 2 servings of lean meat, poultry, or fish each day. One serving of meat is equal to 3 oz (85 g). This is about the same size as a deck of cards. 2 servings of low-fat dairy each day. Heart-healthy fats. Healthy fats called omega-3 fatty acids are a good choice. They're found in foods such as flaxseed and cold-water fish like sardines, salmon, and mackerel. Aim to eat 25-35 g (grams) of fiber a day. Foods that are high in fiber include apples, broccoli, carrots, beans, peas, and whole grains. Do not add salt or condiments that contain salt (such as soy sauce) to foods before eating. When eating at a restaurant, ask that your food be prepared with less salt or no salt, if possible. Try to eat 2 or more plant-based or meat-free meals each week. Cook more meals at home and eat less at restaurants, buffets, and fast food places. General information Do not eat more than 2,300 mg of sodium a day. The amount of sodium that's recommended for you may be lower, depending on your condition. Stay at a healthy weight as told. Ask your provider what a healthy weight is for you. Check  your weight every day. Work with your provider and dietitian to make a plan that will help you lose weight or stay at your current weight. Limit how much fluid you drink. Ask your provider or dietitian how much fluid you can have each day. Limit or avoid alcohol as told. Recommended foods Fruits All fresh, frozen, and canned fruits. Dried fruits, such as raisins, prunes, and cranberries. Vegetables All fresh vegetables.  Vegetables that are frozen without sauce or added salt. Low-sodium or sodium-free canned vegetables. Grains Bread with less than 80 mg of sodium per slice. Whole-wheat pasta, quinoa, and brown rice. Oats and oatmeal. Barley. Millet. Grits and cream of wheat. Whole-grain and whole-wheat cold cereal. Meats and other protein foods Lean cuts of meat. Skinless chicken and [place]. Fish with high omega-3 fatty acids, such as salmon, sardines, and other cold-water fishes. Eggs. Dried beans, peas, and edamame. Unsalted nuts and nut butters. Dairy Low-fat or nonfat (skim) milk and dried milk. Rice milk, soy milk, and almond milk. Low-fat or nonfat yogurt. Small amounts of reduced-sodium block cheese. Low-sodium cottage cheese. Fats and oils Olive, canola, soybean, flaxseed, avocado, or sunflower oil. Sweets and desserts Applesauce. Granola bars. Sugar-free pudding and gelatin. Frozen fruit bars. Seasoning and other foods Fresh and dried herbs. Lemon or lime juice. Vinegar. Low-sodium ketchup. Salt-free marinades, salad dressings, sauces, and seasonings. The items listed above may not be all the foods and drinks you can have. Talk with a dietitian to learn more. Foods to avoid Fruits Fruits that are dried with preservatives that contain sodium. Vegetables Canned vegetables. Frozen vegetables with sauce or seasonings. Creamed vegetables. [place] fries. Onion rings. Pickled vegetables and sauerkraut. Grains Bread with more than 80 mg of sodium per slice. Hot or cold cereal with more than 140 mg sodium per serving. Salted pretzels and crackers. Prepackaged breadcrumbs. Bagels, croissants, and biscuits. Meats and other protein foods Ribs and chicken wings. Bacon, ham, pepperoni, bologna, salami, and packaged luncheon meats. Hot dogs, bratwurst, and sausage. Canned meat. Smoked meat and fish. Salted nuts and seeds. Dairy Whole milk, half-and-half, and cream. Buttermilk. Processed cheese, cheese spreads, and  cheese curds. Regular cottage cheese. Feta cheese. Shredded cheese. String cheese. Fats and oils Butter, lard, shortening, ghee, and bacon fat. Canned and packaged gravies. Seasoning and other foods Onion salt, garlic salt, table salt, and sea salt. Marinades. Regular salad dressings. Relishes, pickles, and olives. Meat flavorings and tenderizers, and bouillon cubes. Horseradish, ketchup, and mustard. Worcestershire sauce. Teriyaki sauce, soy sauce (including reduced sodium). Hot sauce and Tabasco sauce. Steak sauce, fish sauce, oyster sauce, and cocktail sauce. Taco seasonings. Barbecue sauce. Tartar sauce. The items listed above may not be all the foods and drinks you should avoid. Talk with a dietitian to learn more. This information is not intended to replace advice given to you by your health care provider. Make sure you discuss any questions you have with your health care provider. Document Revised: 08/14/2022 Document Reviewed: 08/03/2022 Elsevier Patient Education  2024 [employer].

## 2023-08-17 ENCOUNTER — Other Ambulatory Visit: Payer: Self-pay | Admitting: Family Medicine

## 2023-08-20 NOTE — Assessment & Plan Note (Signed)
 On lantus  , farxiga ad [place] per endo

## 2023-08-20 NOTE — Assessment & Plan Note (Signed)
 Per nephrology

## 2023-08-20 NOTE — Assessment & Plan Note (Signed)
 Per cardiology

## 2023-08-21 DIAGNOSIS — J4489 Other specified chronic obstructive pulmonary disease: Secondary | ICD-10-CM | POA: Diagnosis not present

## 2023-08-21 DIAGNOSIS — I442 Atrioventricular block, complete: Secondary | ICD-10-CM | POA: Diagnosis not present

## 2023-08-21 DIAGNOSIS — M179 Osteoarthritis of knee, unspecified: Secondary | ICD-10-CM | POA: Diagnosis not present

## 2023-08-21 DIAGNOSIS — Z7982 Long term (current) use of aspirin: Secondary | ICD-10-CM | POA: Diagnosis not present

## 2023-08-21 DIAGNOSIS — E1122 Type 2 diabetes mellitus with diabetic chronic kidney disease: Secondary | ICD-10-CM | POA: Diagnosis not present

## 2023-08-21 DIAGNOSIS — G4700 Insomnia, unspecified: Secondary | ICD-10-CM | POA: Diagnosis not present

## 2023-08-21 DIAGNOSIS — J9621 Acute and chronic respiratory failure with hypoxia: Secondary | ICD-10-CM | POA: Diagnosis not present

## 2023-08-21 DIAGNOSIS — Z794 Long term (current) use of insulin: Secondary | ICD-10-CM | POA: Diagnosis not present

## 2023-08-21 DIAGNOSIS — J4520 Mild intermittent asthma, uncomplicated: Secondary | ICD-10-CM | POA: Diagnosis not present

## 2023-08-21 DIAGNOSIS — E782 Mixed hyperlipidemia: Secondary | ICD-10-CM | POA: Diagnosis not present

## 2023-08-21 DIAGNOSIS — E1169 Type 2 diabetes mellitus with other specified complication: Secondary | ICD-10-CM | POA: Diagnosis not present

## 2023-08-21 DIAGNOSIS — E1151 Type 2 diabetes mellitus with diabetic peripheral angiopathy without gangrene: Secondary | ICD-10-CM | POA: Diagnosis not present

## 2023-08-21 DIAGNOSIS — J9622 Acute and chronic respiratory failure with hypercapnia: Secondary | ICD-10-CM | POA: Diagnosis not present

## 2023-08-21 DIAGNOSIS — M109 Gout, unspecified: Secondary | ICD-10-CM | POA: Diagnosis not present

## 2023-08-21 DIAGNOSIS — I059 Rheumatic mitral valve disease, unspecified: Secondary | ICD-10-CM | POA: Diagnosis not present

## 2023-08-21 DIAGNOSIS — I5043 Acute on chronic combined systolic (congestive) and diastolic (congestive) heart failure: Secondary | ICD-10-CM | POA: Diagnosis not present

## 2023-08-21 DIAGNOSIS — I130 Hypertensive heart and chronic kidney disease with heart failure and stage 1 through stage 4 chronic kidney disease, or unspecified chronic kidney disease: Secondary | ICD-10-CM | POA: Diagnosis not present

## 2023-08-21 DIAGNOSIS — I422 Other hypertrophic cardiomyopathy: Secondary | ICD-10-CM | POA: Diagnosis not present

## 2023-08-21 DIAGNOSIS — N1832 Chronic kidney disease, stage 3b: Secondary | ICD-10-CM | POA: Diagnosis not present

## 2023-08-21 DIAGNOSIS — I350 Nonrheumatic aortic (valve) stenosis: Secondary | ICD-10-CM | POA: Diagnosis not present

## 2023-08-21 DIAGNOSIS — I872 Venous insufficiency (chronic) (peripheral): Secondary | ICD-10-CM | POA: Diagnosis not present

## 2023-08-21 DIAGNOSIS — I2510 Atherosclerotic heart disease of native coronary artery without angina pectoris: Secondary | ICD-10-CM | POA: Diagnosis not present

## 2023-08-22 DIAGNOSIS — I422 Other hypertrophic cardiomyopathy: Secondary | ICD-10-CM | POA: Diagnosis not present

## 2023-08-22 DIAGNOSIS — I10 Essential (primary) hypertension: Secondary | ICD-10-CM | POA: Diagnosis not present

## 2023-08-22 DIAGNOSIS — I5032 Chronic diastolic (congestive) heart failure: Secondary | ICD-10-CM | POA: Diagnosis not present

## 2023-08-22 DIAGNOSIS — I110 Hypertensive heart disease with heart failure: Secondary | ICD-10-CM | POA: Diagnosis not present

## 2023-08-26 ENCOUNTER — Encounter (HOSPITAL_BASED_OUTPATIENT_CLINIC_OR_DEPARTMENT_OTHER): Payer: Self-pay | Admitting: Emergency Medicine

## 2023-08-26 ENCOUNTER — Emergency Department (HOSPITAL_BASED_OUTPATIENT_CLINIC_OR_DEPARTMENT_OTHER)

## 2023-08-26 ENCOUNTER — Inpatient Hospital Stay (HOSPITAL_BASED_OUTPATIENT_CLINIC_OR_DEPARTMENT_OTHER): Admission: EM | Admit: 2023-08-26 | Discharge: 2023-10-03 | DRG: 207 | Disposition: E

## 2023-08-26 ENCOUNTER — Other Ambulatory Visit: Payer: Self-pay

## 2023-08-26 DIAGNOSIS — I10 Essential (primary) hypertension: Secondary | ICD-10-CM | POA: Diagnosis present

## 2023-08-26 DIAGNOSIS — Z803 Family history of malignant neoplasm of breast: Secondary | ICD-10-CM

## 2023-08-26 DIAGNOSIS — I421 Obstructive hypertrophic cardiomyopathy: Secondary | ICD-10-CM | POA: Diagnosis present

## 2023-08-26 DIAGNOSIS — G4733 Obstructive sleep apnea (adult) (pediatric): Secondary | ICD-10-CM | POA: Diagnosis not present

## 2023-08-26 DIAGNOSIS — I468 Cardiac arrest due to other underlying condition: Secondary | ICD-10-CM | POA: Diagnosis not present

## 2023-08-26 DIAGNOSIS — J9622 Acute and chronic respiratory failure with hypercapnia: Secondary | ICD-10-CM | POA: Diagnosis not present

## 2023-08-26 DIAGNOSIS — J9610 Chronic respiratory failure, unspecified whether with hypoxia or hypercapnia: Secondary | ICD-10-CM | POA: Diagnosis not present

## 2023-08-26 DIAGNOSIS — R0603 Acute respiratory distress: Secondary | ICD-10-CM | POA: Diagnosis not present

## 2023-08-26 DIAGNOSIS — Z7984 Long term (current) use of oral hypoglycemic drugs: Secondary | ICD-10-CM

## 2023-08-26 DIAGNOSIS — Z6841 Body Mass Index (BMI) 40.0 and over, adult: Secondary | ICD-10-CM | POA: Diagnosis not present

## 2023-08-26 DIAGNOSIS — G253 Myoclonus: Secondary | ICD-10-CM | POA: Diagnosis not present

## 2023-08-26 DIAGNOSIS — J9621 Acute and chronic respiratory failure with hypoxia: Secondary | ICD-10-CM | POA: Diagnosis present

## 2023-08-26 DIAGNOSIS — I5032 Chronic diastolic (congestive) heart failure: Secondary | ICD-10-CM | POA: Diagnosis not present

## 2023-08-26 DIAGNOSIS — Z881 Allergy status to other antibiotic agents status: Secondary | ICD-10-CM

## 2023-08-26 DIAGNOSIS — J9690 Respiratory failure, unspecified, unspecified whether with hypoxia or hypercapnia: Secondary | ICD-10-CM | POA: Diagnosis present

## 2023-08-26 DIAGNOSIS — R531 Weakness: Secondary | ICD-10-CM | POA: Diagnosis not present

## 2023-08-26 DIAGNOSIS — Z9981 Dependence on supplemental oxygen: Secondary | ICD-10-CM

## 2023-08-26 DIAGNOSIS — Z515 Encounter for palliative care: Secondary | ICD-10-CM | POA: Diagnosis not present

## 2023-08-26 DIAGNOSIS — E119 Type 2 diabetes mellitus without complications: Secondary | ICD-10-CM | POA: Diagnosis not present

## 2023-08-26 DIAGNOSIS — U071 COVID-19: Principal | ICD-10-CM | POA: Diagnosis present

## 2023-08-26 DIAGNOSIS — R0602 Shortness of breath: Secondary | ICD-10-CM | POA: Diagnosis not present

## 2023-08-26 DIAGNOSIS — R451 Restlessness and agitation: Secondary | ICD-10-CM | POA: Diagnosis not present

## 2023-08-26 DIAGNOSIS — R710 Precipitous drop in hematocrit: Secondary | ICD-10-CM | POA: Diagnosis not present

## 2023-08-26 DIAGNOSIS — I422 Other hypertrophic cardiomyopathy: Secondary | ICD-10-CM | POA: Diagnosis not present

## 2023-08-26 DIAGNOSIS — N179 Acute kidney failure, unspecified: Secondary | ICD-10-CM | POA: Diagnosis not present

## 2023-08-26 DIAGNOSIS — J449 Chronic obstructive pulmonary disease, unspecified: Secondary | ICD-10-CM | POA: Diagnosis not present

## 2023-08-26 DIAGNOSIS — E870 Hyperosmolality and hypernatremia: Secondary | ICD-10-CM | POA: Diagnosis not present

## 2023-08-26 DIAGNOSIS — J159 Unspecified bacterial pneumonia: Secondary | ICD-10-CM | POA: Diagnosis present

## 2023-08-26 DIAGNOSIS — R590 Localized enlarged lymph nodes: Secondary | ICD-10-CM | POA: Diagnosis present

## 2023-08-26 DIAGNOSIS — R579 Shock, unspecified: Secondary | ICD-10-CM | POA: Diagnosis present

## 2023-08-26 DIAGNOSIS — I272 Pulmonary hypertension, unspecified: Secondary | ICD-10-CM | POA: Diagnosis present

## 2023-08-26 DIAGNOSIS — Z90710 Acquired absence of both cervix and uterus: Secondary | ICD-10-CM

## 2023-08-26 DIAGNOSIS — I130 Hypertensive heart and chronic kidney disease with heart failure and stage 1 through stage 4 chronic kidney disease, or unspecified chronic kidney disease: Secondary | ICD-10-CM | POA: Diagnosis not present

## 2023-08-26 DIAGNOSIS — L89152 Pressure ulcer of sacral region, stage 2: Secondary | ICD-10-CM | POA: Diagnosis not present

## 2023-08-26 DIAGNOSIS — Z8249 Family history of ischemic heart disease and other diseases of the circulatory system: Secondary | ICD-10-CM

## 2023-08-26 DIAGNOSIS — J440 Chronic obstructive pulmonary disease with acute lower respiratory infection: Secondary | ICD-10-CM | POA: Diagnosis not present

## 2023-08-26 DIAGNOSIS — J811 Chronic pulmonary edema: Secondary | ICD-10-CM

## 2023-08-26 DIAGNOSIS — J90 Pleural effusion, not elsewhere classified: Secondary | ICD-10-CM | POA: Diagnosis not present

## 2023-08-26 DIAGNOSIS — E876 Hypokalemia: Secondary | ICD-10-CM | POA: Diagnosis not present

## 2023-08-26 DIAGNOSIS — J9602 Acute respiratory failure with hypercapnia: Principal | ICD-10-CM

## 2023-08-26 DIAGNOSIS — E1169 Type 2 diabetes mellitus with other specified complication: Secondary | ICD-10-CM | POA: Diagnosis present

## 2023-08-26 DIAGNOSIS — Z4682 Encounter for fitting and adjustment of non-vascular catheter: Secondary | ICD-10-CM | POA: Diagnosis not present

## 2023-08-26 DIAGNOSIS — J1282 Pneumonia due to coronavirus disease 2019: Secondary | ICD-10-CM | POA: Diagnosis present

## 2023-08-26 DIAGNOSIS — R918 Other nonspecific abnormal finding of lung field: Secondary | ICD-10-CM | POA: Diagnosis not present

## 2023-08-26 DIAGNOSIS — D696 Thrombocytopenia, unspecified: Secondary | ICD-10-CM | POA: Diagnosis not present

## 2023-08-26 DIAGNOSIS — Z833 Family history of diabetes mellitus: Secondary | ICD-10-CM

## 2023-08-26 DIAGNOSIS — R0989 Other specified symptoms and signs involving the circulatory and respiratory systems: Secondary | ICD-10-CM | POA: Diagnosis not present

## 2023-08-26 DIAGNOSIS — R4589 Other symptoms and signs involving emotional state: Secondary | ICD-10-CM | POA: Diagnosis not present

## 2023-08-26 DIAGNOSIS — Z9911 Dependence on respirator [ventilator] status: Secondary | ICD-10-CM | POA: Diagnosis not present

## 2023-08-26 DIAGNOSIS — Z7189 Other specified counseling: Secondary | ICD-10-CM | POA: Diagnosis not present

## 2023-08-26 DIAGNOSIS — E1122 Type 2 diabetes mellitus with diabetic chronic kidney disease: Secondary | ICD-10-CM | POA: Diagnosis present

## 2023-08-26 DIAGNOSIS — I6782 Cerebral ischemia: Secondary | ICD-10-CM | POA: Diagnosis not present

## 2023-08-26 DIAGNOSIS — F419 Anxiety disorder, unspecified: Secondary | ICD-10-CM | POA: Diagnosis present

## 2023-08-26 DIAGNOSIS — J4520 Mild intermittent asthma, uncomplicated: Secondary | ICD-10-CM | POA: Diagnosis present

## 2023-08-26 DIAGNOSIS — I5033 Acute on chronic diastolic (congestive) heart failure: Secondary | ICD-10-CM | POA: Diagnosis present

## 2023-08-26 DIAGNOSIS — E66813 Obesity, class 3: Secondary | ICD-10-CM | POA: Diagnosis present

## 2023-08-26 DIAGNOSIS — R001 Bradycardia, unspecified: Secondary | ICD-10-CM | POA: Diagnosis present

## 2023-08-26 DIAGNOSIS — J849 Interstitial pulmonary disease, unspecified: Secondary | ICD-10-CM | POA: Diagnosis not present

## 2023-08-26 DIAGNOSIS — I2510 Atherosclerotic heart disease of native coronary artery without angina pectoris: Secondary | ICD-10-CM | POA: Diagnosis present

## 2023-08-26 DIAGNOSIS — Z66 Do not resuscitate: Secondary | ICD-10-CM | POA: Diagnosis present

## 2023-08-26 DIAGNOSIS — I340 Nonrheumatic mitral (valve) insufficiency: Secondary | ICD-10-CM | POA: Diagnosis not present

## 2023-08-26 DIAGNOSIS — J441 Chronic obstructive pulmonary disease with (acute) exacerbation: Secondary | ICD-10-CM | POA: Diagnosis not present

## 2023-08-26 DIAGNOSIS — I517 Cardiomegaly: Secondary | ICD-10-CM | POA: Diagnosis not present

## 2023-08-26 DIAGNOSIS — Z8042 Family history of malignant neoplasm of prostate: Secondary | ICD-10-CM

## 2023-08-26 DIAGNOSIS — N1831 Chronic kidney disease, stage 3a: Secondary | ICD-10-CM | POA: Diagnosis present

## 2023-08-26 DIAGNOSIS — I771 Stricture of artery: Secondary | ICD-10-CM | POA: Diagnosis not present

## 2023-08-26 DIAGNOSIS — E782 Mixed hyperlipidemia: Secondary | ICD-10-CM | POA: Diagnosis present

## 2023-08-26 DIAGNOSIS — G4730 Sleep apnea, unspecified: Secondary | ICD-10-CM | POA: Diagnosis not present

## 2023-08-26 DIAGNOSIS — Z794 Long term (current) use of insulin: Secondary | ICD-10-CM

## 2023-08-26 DIAGNOSIS — N183 Chronic kidney disease, stage 3 unspecified: Secondary | ICD-10-CM

## 2023-08-26 DIAGNOSIS — I425 Other restrictive cardiomyopathy: Secondary | ICD-10-CM | POA: Diagnosis not present

## 2023-08-26 DIAGNOSIS — Z87892 Personal history of anaphylaxis: Secondary | ICD-10-CM

## 2023-08-26 DIAGNOSIS — H5702 Anisocoria: Secondary | ICD-10-CM | POA: Diagnosis present

## 2023-08-26 DIAGNOSIS — I700 Atherosclerosis of aorta: Secondary | ICD-10-CM | POA: Diagnosis not present

## 2023-08-26 DIAGNOSIS — Z888 Allergy status to other drugs, medicaments and biological substances status: Secondary | ICD-10-CM

## 2023-08-26 DIAGNOSIS — Z79899 Other long term (current) drug therapy: Secondary | ICD-10-CM

## 2023-08-26 DIAGNOSIS — E1165 Type 2 diabetes mellitus with hyperglycemia: Secondary | ICD-10-CM | POA: Diagnosis present

## 2023-08-26 DIAGNOSIS — F32A Depression, unspecified: Secondary | ICD-10-CM | POA: Diagnosis present

## 2023-08-26 DIAGNOSIS — Z452 Encounter for adjustment and management of vascular access device: Secondary | ICD-10-CM | POA: Diagnosis not present

## 2023-08-26 DIAGNOSIS — Z7982 Long term (current) use of aspirin: Secondary | ICD-10-CM

## 2023-08-26 DIAGNOSIS — R0902 Hypoxemia: Secondary | ICD-10-CM | POA: Diagnosis not present

## 2023-08-26 DIAGNOSIS — L8990 Pressure ulcer of unspecified site, unspecified stage: Secondary | ICD-10-CM | POA: Insufficient documentation

## 2023-08-26 LAB — MRSA NEXT GEN BY PCR, NASAL: MRSA by PCR Next Gen: NOT DETECTED

## 2023-08-26 LAB — CBC
HCT: 36.4 % (ref 36.0–46.0)
Hemoglobin: 10 g/dL — ABNORMAL LOW (ref 12.0–15.0)
MCH: 25.7 pg — ABNORMAL LOW (ref 26.0–34.0)
MCHC: 27.5 g/dL — ABNORMAL LOW (ref 30.0–36.0)
MCV: 93.6 fL (ref 80.0–100.0)
Platelets: 97 K/uL — ABNORMAL LOW (ref 150–400)
RBC: 3.89 MIL/uL (ref 3.87–5.11)
RDW: 17.8 % — ABNORMAL HIGH (ref 11.5–15.5)
WBC: 6.6 K/uL (ref 4.0–10.5)
nRBC: 0 % (ref 0.0–0.2)

## 2023-08-26 LAB — I-STAT VENOUS BLOOD GAS, ED
Acid-Base Excess: 12 mmol/L — ABNORMAL HIGH (ref 0.0–2.0)
Acid-Base Excess: 15 mmol/L — ABNORMAL HIGH (ref 0.0–2.0)
Bicarbonate: 42.3 mmol/L — ABNORMAL HIGH (ref 20.0–28.0)
Bicarbonate: 43 mmol/L — ABNORMAL HIGH (ref 20.0–28.0)
Calcium, Ion: 1.11 mmol/L — ABNORMAL LOW (ref 1.15–1.40)
Calcium, Ion: 1.13 mmol/L — ABNORMAL LOW (ref 1.15–1.40)
HCT: 34 % — ABNORMAL LOW (ref 36.0–46.0)
HCT: 30 % — ABNORMAL LOW (ref 36.0–46.0)
Hemoglobin: 11.6 g/dL — ABNORMAL LOW (ref 12.0–15.0)
Hemoglobin: 10.2 g/dL — ABNORMAL LOW (ref 12.0–15.0)
O2 Saturation: 67 %
O2 Saturation: 62 %
Potassium: 3.2 mmol/L — ABNORMAL LOW (ref 3.5–5.1)
Potassium: 3.5 mmol/L (ref 3.5–5.1)
Sodium: 142 mmol/L (ref 135–145)
Sodium: 143 mmol/L (ref 135–145)
TCO2: 45 mmol/L — ABNORMAL HIGH (ref 22–32)
TCO2: 45 mmol/L — ABNORMAL HIGH (ref 22–32)
pCO2, Ven: 88.6 mmHg (ref 44–60)
pCO2, Ven: 78 mmHg (ref 44–60)
pH, Ven: 7.287 (ref 7.25–7.43)
pH, Ven: 7.349 (ref 7.25–7.43)
pO2, Ven: 42 mmHg (ref 32–45)
pO2, Ven: 36 mmHg (ref 32–45)

## 2023-08-26 LAB — CBC WITH DIFFERENTIAL/PLATELET
Abs Immature Granulocytes: 0.05 K/uL (ref 0.00–0.07)
Basophils Absolute: 0 K/uL (ref 0.0–0.1)
Basophils Relative: 0 %
Eosinophils Absolute: 0 K/uL (ref 0.0–0.5)
Eosinophils Relative: 0 %
HCT: 35.2 % — ABNORMAL LOW (ref 36.0–46.0)
Hemoglobin: 10.3 g/dL — ABNORMAL LOW (ref 12.0–15.0)
Immature Granulocytes: 1 %
Lymphocytes Relative: 9 %
Lymphs Abs: 0.8 K/uL (ref 0.7–4.0)
MCH: 26.7 pg (ref 26.0–34.0)
MCHC: 29.3 g/dL — ABNORMAL LOW (ref 30.0–36.0)
MCV: 91.2 fL (ref 80.0–100.0)
Monocytes Absolute: 0.9 K/uL (ref 0.1–1.0)
Monocytes Relative: 11 %
Neutro Abs: 6.7 K/uL (ref 1.7–7.7)
Neutrophils Relative %: 79 %
Platelets: 104 K/uL — ABNORMAL LOW (ref 150–400)
RBC: 3.86 MIL/uL — ABNORMAL LOW (ref 3.87–5.11)
RDW: 17.9 % — ABNORMAL HIGH (ref 11.5–15.5)
WBC: 8.5 K/uL (ref 4.0–10.5)
nRBC: 0 % (ref 0.0–0.2)

## 2023-08-26 LAB — COMPREHENSIVE METABOLIC PANEL WITH GFR
ALT: 33 U/L (ref 0–44)
AST: 48 U/L — ABNORMAL HIGH (ref 15–41)
Albumin: 4.1 g/dL (ref 3.5–5.0)
Alkaline Phosphatase: 57 U/L (ref 38–126)
Anion gap: 13 (ref 5–15)
BUN: 45 mg/dL — ABNORMAL HIGH (ref 8–23)
CO2: 35 mmol/L — ABNORMAL HIGH (ref 22–32)
Calcium: 9.5 mg/dL (ref 8.9–10.3)
Chloride: 97 mmol/L — ABNORMAL LOW (ref 98–111)
Creatinine, Ser: 1.5 mg/dL — ABNORMAL HIGH (ref 0.44–1.00)
GFR, Estimated: 36 mL/min — ABNORMAL LOW (ref >60)
Glucose, Bld: 183 mg/dL — ABNORMAL HIGH (ref 70–99)
Potassium: 3.5 mmol/L (ref 3.5–5.1)
Sodium: 146 mmol/L — ABNORMAL HIGH (ref 135–145)
Total Bilirubin: 0.7 mg/dL (ref 0.0–1.2)
Total Protein: 6.8 g/dL (ref 6.5–8.1)

## 2023-08-26 LAB — RESP PANEL BY RT-PCR (RSV, FLU A&B, COVID)  RVPGX2
Influenza A by PCR: NEGATIVE
Influenza B by PCR: NEGATIVE
Resp Syncytial Virus by PCR: NEGATIVE
SARS Coronavirus 2 by RT PCR: POSITIVE — AB

## 2023-08-26 LAB — CREATININE, SERUM
Creatinine, Ser: 1.49 mg/dL — ABNORMAL HIGH (ref 0.44–1.00)
GFR, Estimated: 37 mL/min — ABNORMAL LOW (ref >60)

## 2023-08-26 LAB — TROPONIN I (HIGH SENSITIVITY): Troponin I (High Sensitivity): 85 ng/L — ABNORMAL HIGH (ref <18)

## 2023-08-26 LAB — PROCALCITONIN: Procalcitonin: 0.92 ng/mL

## 2023-08-26 LAB — C-REACTIVE PROTEIN: CRP: 18 mg/dL — ABNORMAL HIGH (ref <1.0)

## 2023-08-26 LAB — PRO BRAIN NATRIURETIC PEPTIDE: Pro Brain Natriuretic Peptide: 22304 pg/mL — ABNORMAL HIGH (ref <300.0)

## 2023-08-26 LAB — GLUCOSE, CAPILLARY: Glucose-Capillary: 182 mg/dL — ABNORMAL HIGH (ref 70–99)

## 2023-08-26 LAB — D-DIMER, QUANTITATIVE: D-Dimer, Quant: 0.97 ug{FEU}/mL — ABNORMAL HIGH (ref 0.00–0.50)

## 2023-08-26 LAB — FERRITIN: Ferritin: 122 ng/mL (ref 11–307)

## 2023-08-26 MED ORDER — MONTELUKAST SODIUM 10 MG PO TABS
10.0000 mg | ORAL_TABLET | Freq: Every day | ORAL | Status: DC
  Administered 2023-08-26 – 2023-08-28 (×3): 10 mg via ORAL
  Filled 2023-08-26 (×3): qty 1

## 2023-08-26 MED ORDER — ORAL CARE MOUTH RINSE
15.0000 mL | OROMUCOSAL | Status: DC | PRN
Start: 2023-08-26 — End: 2023-08-29

## 2023-08-26 MED ORDER — METOPROLOL SUCCINATE ER 25 MG PO TB24
50.0000 mg | ORAL_TABLET | Freq: Every day | ORAL | Status: DC
  Administered 2023-08-26 – 2023-08-28 (×3): 50 mg via ORAL
  Filled 2023-08-26 (×3): qty 2

## 2023-08-26 MED ORDER — SODIUM CHLORIDE 0.9 % IV SOLN
1.0000 g | INTRAVENOUS | Status: DC
  Administered 2023-08-26 – 2023-08-27 (×2): 1 g via INTRAVENOUS
  Filled 2023-08-26 (×3): qty 10

## 2023-08-26 MED ORDER — ORAL CARE MOUTH RINSE
15.0000 mL | OROMUCOSAL | Status: DC
  Administered 2023-08-26 – 2023-08-28 (×9): 15 mL via OROMUCOSAL

## 2023-08-26 MED ORDER — ACETAMINOPHEN 325 MG PO TABS
650.0000 mg | ORAL_TABLET | Freq: Four times a day (QID) | ORAL | Status: DC | PRN
Start: 2023-08-26 — End: 2023-08-29
  Filled 2023-08-26: qty 2

## 2023-08-26 MED ORDER — ASPIRIN 81 MG PO TBEC
81.0000 mg | DELAYED_RELEASE_TABLET | Freq: Every day | ORAL | Status: DC
  Administered 2023-08-26 – 2023-08-28 (×3): 81 mg via ORAL
  Filled 2023-08-26 (×3): qty 1

## 2023-08-26 MED ORDER — ACETAMINOPHEN 650 MG RE SUPP
650.0000 mg | Freq: Four times a day (QID) | RECTAL | Status: DC | PRN
Start: 2023-08-26 — End: 2023-08-29
  Administered 2023-08-29: 650 mg via RECTAL
  Filled 2023-08-26: qty 1

## 2023-08-26 MED ORDER — FUROSEMIDE 10 MG/ML IJ SOLN
40.0000 mg | Freq: Once | INTRAMUSCULAR | Status: AC
  Administered 2023-08-26: 40 mg via INTRAVENOUS
  Filled 2023-08-26: qty 4

## 2023-08-26 MED ORDER — SERTRALINE HCL 100 MG PO TABS
150.0000 mg | ORAL_TABLET | Freq: Every day | ORAL | Status: DC
  Administered 2023-08-27 – 2023-08-29 (×3): 150 mg via ORAL
  Filled 2023-08-26 (×3): qty 2

## 2023-08-26 MED ORDER — LINAGLIPTIN 5 MG PO TABS
5.0000 mg | ORAL_TABLET | Freq: Every day | ORAL | Status: DC
  Administered 2023-08-27 – 2023-08-29 (×3): 5 mg via ORAL
  Filled 2023-08-26 (×3): qty 1

## 2023-08-26 MED ORDER — IPRATROPIUM-ALBUTEROL 0.5-2.5 (3) MG/3ML IN SOLN
3.0000 mL | Freq: Once | RESPIRATORY_TRACT | Status: AC
  Administered 2023-08-26: 3 mL via RESPIRATORY_TRACT
  Filled 2023-08-26: qty 3

## 2023-08-26 MED ORDER — CHLORHEXIDINE GLUCONATE CLOTH 2 % EX PADS
6.0000 | MEDICATED_PAD | Freq: Every day | CUTANEOUS | Status: DC
  Administered 2023-08-26 – 2023-09-10 (×14): 6 via TOPICAL

## 2023-08-26 MED ORDER — INSULIN ASPART 100 UNIT/ML IJ SOLN
0.0000 [IU] | Freq: Three times a day (TID) | INTRAMUSCULAR | Status: DC
  Administered 2023-08-27: 3 [IU] via SUBCUTANEOUS
  Administered 2023-08-27: 5 [IU] via SUBCUTANEOUS
  Administered 2023-08-27: 3 [IU] via SUBCUTANEOUS
  Administered 2023-08-28: 2 [IU] via SUBCUTANEOUS
  Administered 2023-08-28: 3 [IU] via SUBCUTANEOUS
  Administered 2023-08-28: 5 [IU] via SUBCUTANEOUS
  Administered 2023-08-29: 3 [IU] via SUBCUTANEOUS
  Administered 2023-08-29 (×2): 2 [IU] via SUBCUTANEOUS

## 2023-08-26 MED ORDER — ONDANSETRON HCL 4 MG/2ML IJ SOLN: 4.0000 mg | Freq: Four times a day (QID) | INTRAMUSCULAR | Status: DC | PRN

## 2023-08-26 MED ORDER — IPRATROPIUM-ALBUTEROL 0.5-2.5 (3) MG/3ML IN SOLN
3.0000 mL | Freq: Four times a day (QID) | RESPIRATORY_TRACT | Status: DC
  Administered 2023-08-26 – 2023-09-07 (×45): 3 mL via RESPIRATORY_TRACT
  Filled 2023-08-26 (×43): qty 3

## 2023-08-26 MED ORDER — ROSUVASTATIN CALCIUM 10 MG PO TABS
10.0000 mg | ORAL_TABLET | Freq: Every day | ORAL | Status: DC
  Administered 2023-08-27 – 2023-08-29 (×3): 10 mg via ORAL
  Filled 2023-08-26 (×3): qty 1

## 2023-08-26 MED ORDER — GABAPENTIN 100 MG PO CAPS
200.0000 mg | ORAL_CAPSULE | Freq: Every day | ORAL | Status: DC
  Administered 2023-08-26 – 2023-08-28 (×3): 200 mg via ORAL
  Filled 2023-08-26 (×4): qty 2

## 2023-08-26 MED ORDER — DOXYCYCLINE HYCLATE 100 MG PO TABS
100.0000 mg | ORAL_TABLET | Freq: Two times a day (BID) | ORAL | Status: DC
  Administered 2023-08-26 – 2023-08-28 (×5): 100 mg via ORAL
  Filled 2023-08-26 (×6): qty 1

## 2023-08-26 MED ORDER — DICLOFENAC SODIUM 1 % EX GEL: 4.0000 g | CUTANEOUS | Status: DC | PRN

## 2023-08-26 MED ORDER — RISAQUAD PO CAPS
1.0000 | ORAL_CAPSULE | Freq: Every day | ORAL | Status: DC
  Administered 2023-08-27 – 2023-08-28 (×2): 1 via ORAL
  Filled 2023-08-26 (×3): qty 1

## 2023-08-26 MED ORDER — METHYLPREDNISOLONE SODIUM SUCC 40 MG IJ SOLR
40.0000 mg | Freq: Three times a day (TID) | INTRAMUSCULAR | Status: DC
  Administered 2023-08-26 – 2023-08-27 (×2): 40 mg via INTRAVENOUS
  Filled 2023-08-26 (×2): qty 1

## 2023-08-26 MED ORDER — ADULT MULTIVITAMIN W/MINERALS CH
1.0000 | ORAL_TABLET | Freq: Every day | ORAL | Status: DC
  Administered 2023-08-26 – 2023-08-28 (×3): 1 via ORAL
  Filled 2023-08-26 (×4): qty 1

## 2023-08-26 MED ORDER — POLYETHYLENE GLYCOL 3350 17 G PO PACK: 17.0000 g | PACK | ORAL | Status: DC | PRN

## 2023-08-26 MED ORDER — DISOPYRAMIDE PHOSPHATE 100 MG PO CAPS
100.0000 mg | ORAL_CAPSULE | Freq: Three times a day (TID) | ORAL | Status: DC
  Administered 2023-08-27 – 2023-08-29 (×6): 100 mg via ORAL
  Filled 2023-08-26 (×8): qty 1

## 2023-08-26 MED ORDER — FUROSEMIDE 10 MG/ML IJ SOLN
20.0000 mg | Freq: Two times a day (BID) | INTRAMUSCULAR | Status: DC
  Administered 2023-08-26 – 2023-08-27 (×3): 20 mg via INTRAVENOUS
  Filled 2023-08-26 (×3): qty 2

## 2023-08-26 MED ORDER — METHYLPREDNISOLONE SODIUM SUCC 125 MG IJ SOLR
125.0000 mg | Freq: Once | INTRAMUSCULAR | Status: AC
  Administered 2023-08-26: 125 mg via INTRAVENOUS
  Filled 2023-08-26: qty 2

## 2023-08-26 MED ORDER — HYDROCODONE-ACETAMINOPHEN 5-325 MG PO TABS
1.0000 | ORAL_TABLET | Freq: Three times a day (TID) | ORAL | Status: DC | PRN
  Filled 2023-08-26: qty 1

## 2023-08-26 MED ORDER — SENNA 8.6 MG PO TABS
1.0000 | ORAL_TABLET | Freq: Two times a day (BID) | ORAL | Status: DC
  Administered 2023-08-26 – 2023-08-28 (×5): 8.6 mg via ORAL
  Filled 2023-08-26 (×5): qty 1

## 2023-08-26 MED ORDER — ALBUTEROL SULFATE (2.5 MG/3ML) 0.083% IN NEBU
3.0000 mL | INHALATION_SOLUTION | Freq: Four times a day (QID) | RESPIRATORY_TRACT | Status: DC | PRN
  Administered 2023-08-28 – 2023-09-06 (×4): 3 mL via RESPIRATORY_TRACT
  Filled 2023-08-26 (×2): qty 3

## 2023-08-26 MED ORDER — INSULIN GLARGINE-YFGN 100 UNIT/ML ~~LOC~~ SOLN
5.0000 [IU] | Freq: Every day | SUBCUTANEOUS | Status: DC
  Administered 2023-08-26 – 2023-08-28 (×3): 5 [IU] via SUBCUTANEOUS
  Filled 2023-08-26 (×4): qty 0.05

## 2023-08-26 MED ORDER — ENOXAPARIN SODIUM 40 MG/0.4ML IJ SOSY
40.0000 mg | PREFILLED_SYRINGE | INTRAMUSCULAR | Status: DC
  Administered 2023-08-26 – 2023-08-28 (×3): 40 mg via SUBCUTANEOUS
  Filled 2023-08-26 (×3): qty 0.4

## 2023-08-26 MED ORDER — HYDRALAZINE HCL 20 MG/ML IJ SOLN: 5.0000 mg | Freq: Four times a day (QID) | INTRAMUSCULAR | Status: DC | PRN

## 2023-08-26 MED ORDER — ONDANSETRON HCL 4 MG PO TABS
4.0000 mg | ORAL_TABLET | Freq: Four times a day (QID) | ORAL | Status: DC | PRN
  Administered 2023-08-27: 4 mg via ORAL
  Filled 2023-08-26: qty 1

## 2023-08-26 NOTE — Consult Note (Signed)
 Referring Physician: Margaretmary Haley, MD  Morgan Fischer is an 73 y.o. female.                       Chief Complaint: Hypertrophic obstructive cardiomyopathy with diastolic heart failure and COVID infection with possible pneumonia  HPI:  73 y.o. black female with past medical history significant for morbid obesity, hypertrophic obstructive cardiomyopathy for which patient is on Norpace  and was recently seen at [facility], chronic hypoxemic and hypercapnic respiratory failure with 2 L of oxygen  and BiPAP at nighttime, asthma/COPD, type II DM, CKD 3A, who presents for symptoms of coughing and shortness of breath for the past 4 to 5 days.   Patient has COVID infection and has worsening of respiratory symptoms over last 5 days.  CXR is positive for vascular congestion and cardiomegaly. Her last echocardiogram showed significant LVH with significant gradient of LVOT. CT chest is pending.  Past Medical History:  Diagnosis Date   Acute diastolic heart failure (HCC) 07/17/2019   Acute hypercapnic respiratory failure (HCC) 07/21/2016   Acute on chronic congestive heart failure (HCC) 07/19/2016   Acute on chronic diastolic CHF (congestive heart failure) (HCC) 03/08/2020   Acute on chronic respiratory failure with hypoxia (HCC) 10/20/2018   Acute on chronic respiratory failure with hypoxia and hypercapnia (HCC) 03/08/2020   Acute respiratory acidosis (HCC) 07/19/2016   Acute respiratory distress 07/19/2016   Acute respiratory failure (HCC) 03/08/2020   Anxiety 03/15/2020   Aortic stenosis 11/01/2018   Arthritis    Asthma    Atherosclerosis of native coronary artery of native heart without angina pectoris 03/17/2016   Cardiomyopathy (HCC) 03/15/2020   Chest pain 07/14/2019   CHF (congestive heart failure) (HCC)    Chronic diastolic heart failure (HCC) 03/17/2016   Chronic hypoxemic respiratory failure (HCC)    Chronic kidney disease, stage 3b (HCC) 02/04/2021   CKD (chronic kidney disease)     COPD (chronic obstructive pulmonary disease) (HCC)    COPD with acute exacerbation (HCC) 03/08/2020   Diabetes mellitus without complication (HCC)    Drug allergy 07/21/2016   Essential (primary) hypertension 11/19/2016   Essential hypertension 11/19/2016   Gout 11/19/2016   Heart block AV complete (HCC)    High cholesterol    History of gout 04/05/2023   HOCM (hypertrophic obstructive cardiomyopathy) (HCC) 09/18/2017   Hyperlipidemia associated with type 2 diabetes mellitus (HCC) 11/23/2022   Hyperlipidemia, unspecified 03/15/2020   Hypertension    Hypertensive heart disease with heart failure (HCC) 03/17/2016   Hypertensive heart disease without congestive heart failure 03/15/2020   Hypertrophic cardiomyopathy (HCC) 04/12/2020   Hypokalemia 08/24/2015   Hypomagnesemia    Hypoxia 03/17/2016   Insomnia 11/23/2022   Iron deficiency 03/15/2020   Leukocytosis 07/19/2016   Long term (current) use of insulin  (HCC) 02/04/2021   Mild intermittent asthma 02/04/2021   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   Mixed hyperlipidemia 03/15/2020   Morbid obesity (HCC)    Morbid obesity with BMI of 60.0-69.9, adult (HCC) 07/21/2016   Need for influenza vaccination 11/23/2022   Need for pneumococcal 20-valent conjugate vaccination 11/23/2022   Nonrheumatic aortic valve stenosis 03/17/2016   Obesity hypoventilation syndrome (HCC) 03/08/2020   Obstructive sleep apnea 11/19/2016   Osteoarthritis of knee 03/15/2020   Panniculitis 08/23/2015   Peripheral venous insufficiency 03/15/2020   Pure hypercholesterolemia 02/13/2017   SOB (shortness of breath) 08/24/2015   Troponin level elevated 08/24/2015   Type 2 diabetes mellitus with hyperglycemia (HCC) 07/19/2016   Type  2 diabetes mellitus with stage 3 chronic kidney disease, with long-term current use of insulin  (HCC) 11/19/2016   Type 2 diabetes mellitus without complication, without long-term current use of insulin  (HCC) 04/12/2020   Type 2 diabetes  mellitus without complications (HCC) 03/15/2020   Type 2 diabetes mellitus, without long-term current use of insulin  (HCC) 11/19/2016      Past Surgical History:  Procedure Laterality Date   ABDOMINAL HYSTERECTOMY     cellulitis debridement  2000   abdomen   CESAREAN SECTION      Family History  Problem Relation Age of Onset   Diabetes Mellitus I Mother    CAD Mother    Diabetes Mother    Heart disease Father    Cancer Father    Breast cancer Sister    Cancer Sister    Heart disease Sister    Prostate cancer Brother    Social History:  reports that she has never smoked. She has never used smokeless tobacco. She reports that she does not drink alcohol and does not use drugs.  Allergies:  Allergies  Allergen Reactions   Atorvastatin Anaphylaxis and Swelling    Lip swelling    Ciprofloxacin Anaphylaxis    Kidney failure   Ciprocinonide [Fluocinolone] Other (See Comments)    Kidney failure   Catapres [Clonidine Hcl] Rash   Clonidine Rash   Demadex [Torsemide] Rash    Medications Prior to Admission  Medication Sig Dispense Refill   allopurinol  (ZYLOPRIM ) 300 MG tablet Take 100 mg by mouth daily.     aspirin  EC 81 MG EC tablet Take 1 tablet (81 mg total) by mouth daily. 120 tablet 0   Cholecalciferol  50 MCG (2000 UT) CAPS Take 2,000 Units by mouth daily.     Cyanocobalamin (VITAMIN B-12 PO) Take 1 tablet by mouth daily.     dapagliflozin propanediol (FARXIGA) 10 MG TABS tablet Take 10 mg by mouth daily.     diclofenac  Sodium (VOLTAREN ) 1 % GEL Apply 1 application topically as needed for pain (pain).     disopyramide  (NORPACE ) 100 MG capsule 1 po tid     ferrous sulfate  325 (65 FE) MG EC tablet Take 1 tablet by mouth daily.     fluticasone  furoate-vilanterol (BREO ELLIPTA ) 100-25 MCG/ACT AEPB Inhale 1 puff into the lungs daily.     fluticasone  furoate-vilanterol (BREO ELLIPTA ) 200-25 MCG/ACT AEPB Inhale 1 puff into the lungs daily. 1 each 11   furosemide  (LASIX ) 40 MG  tablet Take 1 tablet (40 mg total) by mouth daily.     gabapentin  (NEURONTIN ) 100 MG capsule TAKE 2 CAPSULES BY MOUTH DAILY 180 capsule 1   Garlic 1000 MG CAPS Take 1,000 mg by mouth daily.     ipratropium-albuterol  (DUONEB) 0.5-2.5 (3) MG/3ML SOLN Inhale 3 mLs into the lungs every 4 (four) hours as needed for wheezing (wheezing).     JANUVIA 25 MG tablet Take 25 mg by mouth daily.     Lactobacillus [facility] WOMEN) CAPS Take 1 capsule by mouth daily as needed.     LANTUS  SOLOSTAR 100 UNIT/ML Solostar Pen Inject 26 Units into the skin at bedtime.     metoprolol  succinate (TOPROL -XL) 50 MG 24 hr tablet TAKE 1 TABLET(50 MG) BY MOUTH DAILY 90 tablet 2   montelukast  (SINGULAIR ) 10 MG tablet Take 1 tablet (10 mg total) by mouth at bedtime. 90 tablet 1   Multiple Vitamin (MULTIVITAMIN WITH MINERALS) TABS tablet Take 1 tablet by mouth daily.     Omega-3  Fatty Acids (FISH OIL) 1000 MG CAPS Take 1,000 mg by mouth 2 (two) times daily.      polyethylene glycol (MIRALAX  / GLYCOLAX ) 17 g packet Take 17 g by mouth as needed for mild constipation.     rosuvastatin  (CRESTOR ) 10 MG tablet Take 1 tablet (10 mg total) by mouth daily. 90 tablet 1   sertraline  (ZOLOFT ) 100 MG tablet 1 1/2 tab po every day 45 tablet 3   Suvorexant  (BELSOMRA ) 20 MG TABS TAKE 1 TABLET(20 MG) BY MOUTH AT BEDTIME AS NEEDED 30 tablet 2   Turmeric 500 MG CAPS Take 1 capsule by mouth daily.     VENTOLIN  HFA 108 (90 Base) MCG/ACT inhaler Inhale 2 puffs into the lungs every 6 (six) hours as needed for wheezing or shortness of breath.      OXYGEN  Inhale 2 L into the lungs continuous.     Probiotic Product (PROBIOTIC PO) Take 1 tablet by mouth daily in the afternoon. (Patient not taking: Reported on 08/26/2023)      Results for orders placed or performed during the hospital encounter of 08/26/23 (from the past 48 hours)  I-Stat venous blood gas, ([facility], [facility], [facility])     Status: Abnormal   Collection Time: 08/26/23 12:16 PM  Result Value Ref  Range   pH, Ven 7.287 7.25 - 7.43   pCO2, Ven 88.6 (HH) 44 - 60 mmHg   pO2, Ven 42 32 - 45 mmHg   Bicarbonate 42.3 (H) 20.0 - 28.0 mmol/L   TCO2 45 (H) 22 - 32 mmol/L   O2 Saturation 67 %   Acid-Base Excess 12.0 (H) 0.0 - 2.0 mmol/L   Sodium 142 135 - 145 mmol/L   Potassium 3.2 (L) 3.5 - 5.1 mmol/L   Calcium , Ion 1.11 (L) 1.15 - 1.40 mmol/L   HCT 34.0 (L) 36.0 - 46.0 %   Hemoglobin 11.6 (L) 12.0 - 15.0 g/dL   Sample type VENOUS    Comment NOTIFIED PHYSICIAN   CBC with Differential     Status: Abnormal   Collection Time: 08/26/23 12:17 PM  Result Value Ref Range   WBC 8.5 4.0 - 10.5 K/uL   RBC 3.86 (L) 3.87 - 5.11 MIL/uL   Hemoglobin 10.3 (L) 12.0 - 15.0 g/dL   HCT 64.7 (L) 63.9 - 53.9 %   MCV 91.2 80.0 - 100.0 fL   MCH 26.7 26.0 - 34.0 pg   MCHC 29.3 (L) 30.0 - 36.0 g/dL   RDW 82.0 (H) 88.4 - 84.4 %   Platelets 104 (L) 150 - 400 K/uL   nRBC 0.0 0.0 - 0.2 %   Neutrophils Relative % 79 %   Neutro Abs 6.7 1.7 - 7.7 K/uL   Lymphocytes Relative 9 %   Lymphs Abs 0.8 0.7 - 4.0 K/uL   Monocytes Relative 11 %   Monocytes Absolute 0.9 0.1 - 1.0 K/uL   Eosinophils Relative 0 %   Eosinophils Absolute 0.0 0.0 - 0.5 K/uL   Basophils Relative 0 %   Basophils Absolute 0.0 0.0 - 0.1 K/uL   Immature Granulocytes 1 %   Abs Immature Granulocytes 0.05 0.00 - 0.07 K/uL    Comment: Performed at [facility], [address] [place], [place] [address]  Brain natriuretic peptide     Status: Abnormal   Collection Time: 08/26/23 12:17 PM  Result Value Ref Range   Pro Brain Natriuretic Peptide 22,304.0 (H) <300.0 pg/mL    Comment: (NOTE) Age Group  Cut-Points    Interpretation  < 50 years     450 pg/mL       NT-proBNP > 450 pg/mL indicates                                ADHF is likely              50 to 75 years  900 pg/mL      NT-proBNP > 900 pg/mL indicates          ADHF is likely  > 75 years      1800 pg/mL     NT-proBNP > 1800 pg/mL indicates          ADHF is likely                            All ages    Results between       Indeterminate. Further clinical             300 and the cut-   information is needed to determine            point for age group   if ADHF is present.                                                             Elecsys proBNP II/ Elecsys proBNP II STAT           Cut-Point                       Interpretation  300 pg/mL                    NT-proBNP <300pg/mL indicates                             ADHF is not likely  Performed at [facility], [address] [place], [place] [address]   Resp panel by RT-PCR (RSV, Flu A&B, Covid) Anterior Nasal Swab     Status: Abnormal   Collection Time: 08/26/23 12:17 PM   Specimen: Anterior Nasal Swab  Result Value Ref Range   SARS Coronavirus 2 by RT PCR POSITIVE (A) NEGATIVE    Comment: (NOTE) SARS-CoV-2 target nucleic acids are DETECTED.  The SARS-CoV-2 RNA is generally detectable in upper respiratory specimens during the acute phase of infection. Positive results are indicative of the presence of the identified virus, but do not rule out bacterial infection or co-infection with other pathogens not detected by the test. Clinical correlation with patient history and other diagnostic information is necessary to determine patient infection status. The expected result is Negative.  Fact Sheet for Patients: BloggerCourse.com  Fact Sheet for Healthcare Providers: SeriousBroker.it  This test is not yet approved or cleared by the [place]  FDA and  has been authorized for detection and/or diagnosis of SARS-CoV-2 by FDA under an Emergency Use Authorization (EUA).  This EUA will remain in effect (meaning this test can be used) for the duration of  the COVID-19 declaration under Section 564(b)(1) of the A ct, 21 U.S.C. section 360bbb-3(b)(1), unless the authorization  is terminated or revoked sooner.     Influenza A by PCR  NEGATIVE NEGATIVE   Influenza B by PCR NEGATIVE NEGATIVE    Comment: (NOTE) The Xpert Xpress SARS-CoV-2/FLU/RSV plus assay is intended as an aid in the diagnosis of influenza from Nasopharyngeal swab specimens and should not be used as a sole basis for treatment. Nasal washings and aspirates are unacceptable for Xpert Xpress SARS-CoV-2/FLU/RSV testing.  Fact Sheet for Patients: BloggerCourse.com  Fact Sheet for Healthcare Providers: SeriousBroker.it  This test is not yet approved or cleared by the [place]  FDA and has been authorized for detection and/or diagnosis of SARS-CoV-2 by FDA under an Emergency Use Authorization (EUA). This EUA will remain in effect (meaning this test can be used) for the duration of the COVID-19 declaration under Section 564(b)(1) of the Act, 21 U.S.C. section 360bbb-3(b)(1), unless the authorization is terminated or revoked.     Resp Syncytial Virus by PCR NEGATIVE NEGATIVE    Comment: (NOTE) Fact Sheet for Patients: BloggerCourse.com  Fact Sheet for Healthcare Providers: SeriousBroker.it  This test is not yet approved or cleared by the [place]  FDA and has been authorized for detection and/or diagnosis of SARS-CoV-2 by FDA under an Emergency Use Authorization (EUA). This EUA will remain in effect (meaning this test can be used) for the duration of the COVID-19 declaration under Section 564(b)(1) of the Act, 21 U.S.C. section 360bbb-3(b)(1), unless the authorization is terminated or revoked.  Performed at [facility], [address] [place], [place] [address]   Comprehensive metabolic panel     Status: Abnormal   Collection Time: 08/26/23 12:17 PM  Result Value Ref Range   Sodium 146 (H) 135 - 145 mmol/L   Potassium 3.5 3.5 - 5.1 mmol/L    Comment: HEMOLYSIS AT THIS LEVEL MAY AFFECT RESULT   Chloride 97 (L) 98 - 111  mmol/L   CO2 35 (H) 22 - 32 mmol/L   Glucose, Bld 183 (H) 70 - 99 mg/dL    Comment: Glucose reference range applies only to samples taken after fasting for at least 8 hours.   BUN 45 (H) 8 - 23 mg/dL   Creatinine, Ser 8.49 (H) 0.44 - 1.00 mg/dL   Calcium  9.5 8.9 - 10.3 mg/dL   Total Protein 6.8 6.5 - 8.1 g/dL   Albumin 4.1 3.5 - 5.0 g/dL   AST 48 (H) 15 - 41 U/L   ALT 33 0 - 44 U/L   Alkaline Phosphatase 57 38 - 126 U/L   Total Bilirubin 0.7 0.0 - 1.2 mg/dL   GFR, Estimated 36 (L) >60 mL/min    Comment: (NOTE) Calculated using the CKD-EPI Creatinine Equation (2021)    Anion gap 13 5 - 15    Comment: Performed at [facility], [address] [place], [place] [address]  I-Stat venous blood gas, ED     Status: Abnormal   Collection Time: 08/26/23  2:01 PM  Result Value Ref Range   pH, Ven 7.349 7.25 - 7.43   pCO2, Ven 78.0 (HH) 44 - 60 mmHg   pO2, Ven 36 32 - 45 mmHg   Bicarbonate 43.0 (H) 20.0 - 28.0 mmol/L   TCO2 45 (H) 22 - 32 mmol/L   O2 Saturation 62 %   Acid-Base Excess 15.0 (H) 0.0 - 2.0 mmol/L   Sodium 143 135 - 145 mmol/L   Potassium 3.5 3.5 - 5.1 mmol/L   Calcium , Ion 1.13 (L) 1.15 - 1.40 mmol/L  HCT 30.0 (L) 36.0 - 46.0 %   Hemoglobin 10.2 (L) 12.0 - 15.0 g/dL   Sample type VENOUS    Comment NOTIFIED PHYSICIAN   MRSA Next Gen by PCR, Nasal     Status: None   Collection Time: 08/26/23  5:06 PM   Specimen: Nasal Mucosa; Nasal Swab  Result Value Ref Range   MRSA by PCR Next Gen NOT DETECTED NOT DETECTED    Comment: (NOTE) The GeneXpert MRSA Assay (FDA approved for NASAL specimens only), is one component of a comprehensive MRSA colonization surveillance program. It is not intended to diagnose MRSA infection nor to guide or monitor treatment for MRSA infections. Test performance is not FDA approved in patients less than 85 years old. Performed at [facility], [address] [place], [place] [address]   CBC     Status: Abnormal    Collection Time: 08/26/23  6:22 PM  Result Value Ref Range   WBC 6.6 4.0 - 10.5 K/uL   RBC 3.89 3.87 - 5.11 MIL/uL   Hemoglobin 10.0 (L) 12.0 - 15.0 g/dL   HCT 63.5 63.9 - 53.9 %   MCV 93.6 80.0 - 100.0 fL   MCH 25.7 (L) 26.0 - 34.0 pg   MCHC 27.5 (L) 30.0 - 36.0 g/dL   RDW 82.1 (H) 88.4 - 84.4 %   Platelets 97 (L) 150 - 400 K/uL    Comment: SPECIMEN CHECKED FOR CLOTS REPEATED TO VERIFY Immature Platelet Fraction may be clinically indicated, consider ordering this additional test [id number]    nRBC 0.0 0.0 - 0.2 %    Comment: Performed at [facility], [address], [place], [place] [address]  Creatinine, serum     Status: Abnormal   Collection Time: 08/26/23  6:22 PM  Result Value Ref Range   Creatinine, Ser 1.49 (H) 0.44 - 1.00 mg/dL   GFR, Estimated 37 (L) >60 mL/min    Comment: (NOTE) Calculated using the CKD-EPI Creatinine Equation (2021) Performed at [facility], [address], [place], [place] [address]   Procalcitonin     Status: None   Collection Time: 08/26/23  6:22 PM  Result Value Ref Range   Procalcitonin 0.92 ng/mL    Comment:        Interpretation: PCT > 0.5 ng/mL and <= 2 ng/mL: Systemic infection (sepsis) is possible, but other conditions are known to elevate PCT as well. (NOTE)       Sepsis PCT Algorithm           Lower Respiratory Tract                                      Infection PCT Algorithm    ----------------------------     ----------------------------         PCT < 0.25 ng/mL                PCT < 0.10 ng/mL          Strongly encourage             Strongly discourage   discontinuation of antibiotics    initiation of antibiotics    ----------------------------     -----------------------------       PCT 0.25 - 0.50 ng/mL            PCT 0.10 - 0.25 ng/mL  OR       >80% decrease in PCT            Discourage initiation of                                            antibiotics       Encourage discontinuation           of antibiotics    ----------------------------     -----------------------------         PCT >= 0.50 ng/mL              PCT 0.26 - 0.50 ng/mL                AND       <80% decrease in PCT             Encourage initiation of                                             antibiotics       Encourage continuation           of antibiotics    ----------------------------     -----------------------------        PCT >= 0.50 ng/mL                  PCT > 0.50 ng/mL               AND         increase in PCT                  Strongly encourage                                      initiation of antibiotics    Strongly encourage escalation           of antibiotics                                     -----------------------------                                           PCT <= 0.25 ng/mL                                                 OR                                        > 80% decrease in PCT                                      Discontinue / Do not initiate  antibiotics  Performed at [facility], [address] [place], [place] [address]   Ferritin     Status: None   Collection Time: 08/26/23  7:10 PM  Result Value Ref Range   Ferritin 122 11 - 307 ng/mL    Comment: Performed at [facility], [address] [place], [place] [address]  D-dimer, quantitative     Status: Abnormal   Collection Time: 08/26/23  7:10 PM  Result Value Ref Range   D-Dimer, Quant 0.97 (H) 0.00 - 0.50 ug/mL-FEU    Comment: (NOTE) At the manufacturer cut-off value of 0.5 g/mL FEU, this assay has a negative predictive value of 95-100%.This assay is intended for use in conjunction with a clinical pretest probability (PTP) assessment model to exclude pulmonary embolism (PE) and deep venous thrombosis (DVT) in outpatients suspected of PE or DVT. Results should be correlated with clinical  presentation. Performed at [facility] [address] [place], [place] [address]   Troponin I (High Sensitivity)     Status: Abnormal   Collection Time: 08/26/23  7:10 PM  Result Value Ref Range   Troponin I (High Sensitivity) 85 (H) <18 ng/L    Comment: (NOTE) Elevated high sensitivity troponin I (hsTnI) values and significant  changes across serial measurements may suggest ACS but many other  chronic and acute conditions are known to elevate hsTnI results.  Refer to the Links section for chest pain algorithms and additional  guidance. Performed at [facility], [address] [place], [place] [address]    DG Chest Port 1 View Result Date: 08/26/2023 CLINICAL DATA:  Shortness of breath. EXAM: PORTABLE CHEST 1 VIEW COMPARISON:  08/16/2023. FINDINGS: Patient is rotated to the left. Unchanged marked enlargement of the cardiopericardial silhouette with pulmonary vascular congestion and possible interstitial edema. No large pleural effusion. No pneumothorax. No acute osseous abnormality. IMPRESSION: Marked cardiomegaly with pulmonary vascular congestion and suspected interstitial edema, similar to the prior exam. Electronically Signed   By: Harrietta Sherry M.D.   On: 08/26/2023 12:57    Review Of Systems Constitutional: Positive fever, chills, chronic weight gain. Eyes: No vision change, wears glasses. No discharge or pain. Ears: No hearing loss, No tinnitus. Respiratory: Positive asthma, COPD, pneumonias, shortness of breath. No hemoptysis. Cardiovascular: h/o chest pain, palpitation, leg edema. Gastrointestinal: No nausea, vomiting, diarrhea, constipation. No GI bleed. No hepatitis. Genitourinary: No dysuria, hematuria, kidney stone. No incontinance. Neurological: No headache, stroke, seizures.  Psychiatry: No psych facility admission for anxiety, depression, suicide. No detox. Skin: No rash. Musculoskeletal: Positive joint pain, no  fibromyalgia. No neck pain, back pain. Lymphadenopathy: No lymphadenopathy. Hematology: Mild anemia, no easy bruising.   Blood pressure (!) 167/72, pulse 88, temperature 98.2 F (36.8 C), temperature source Oral, resp. rate (!) 27, height 5' 3 (1.6 m), weight 127.4 kg, SpO2 97%. Body mass index is 49.75 kg/m. General appearance: alert, cooperative, appears stated age and no distress Head: Normocephalic, atraumatic. Eyes: Brown eyes, Pale pink conjunctiva, corneas clear.  Neck: No adenopathy, no carotid bruit, no JVD, supple, symmetrical, trachea midline and thyroid  not enlarged. Resp: Clear to auscultation bilaterally. Cardio: Regular rate and rhythm, S1, S2 normal, III/VI systolic murmur, no click, rub or gallop GI: Soft, non-tender; bowel sounds normal; no organomegaly. Extremities: No edema, cyanosis or clubbing. Positive venous stasis changes. Skin: Warm and dry.  Neurologic: Alert and oriented X 3, normal strength. Normal coordination.  Assessment/Plan Acute on chronic hypoxemic/hypercapnic respiratory failure  COVID infection  Hypertrophic obstructive cardiomyopathy COPD Type 2 DM CKD IIIa HLD Anxiety/depression Morbid obesity, Stage 3  Plan: Continue Norpace . Diuresis as needed. Repeat echocardiogram.   Time spent: Review of old records, Lab, x-rays, EKG, other cardiac tests, examination, discussion with patient/Doctor/Nurse over 70 minutes.  Salena GORMAN Negri, MD  08/26/2023, 9:31 PM

## 2023-08-26 NOTE — ED Provider Notes (Signed)
 [facility] Provider Note   CSN: [id number] Arrival date & time: 08/26/23  1152     Patient presents with: Shortness of Breath   Morgan Fischer is a 73 y.o. female.    Shortness of Breath  Patient presents because of increasing shortness of breath.  Patient states has been having a nonproductive cough over the past week or so.  Patient states that she is usually on 2 L of nasal cannula but over the past couple weeks she has been increasing her nasal cannula to 3 L nasal cannula.  Patient states is compliant with her 80 mg of Lasix  daily.  Endorses bilateral lower leg swelling that seems to be getting worse as well.  Denies chest pain.  No clear chest pain.  No hemoptysis.  No history of DVT or PE.  No fever no chills.  No sick contacts that she is aware of.  Previous medical history reviewed : Follow-up with family medicine on August 14.  Patient was complained about worsening shortness of breath at that time.  There was concern that she was having worsening heart failure exacerbation.  Was increased on her Lasix  dose at that time.  Last admission was in April 2025.  Was admitted because of acute on chronic respiratory failure with hypercapnia.  The last echo that I can is from August 2025:    HYPERCONTRACTILE LEFT VENTRICULAR FUNCTION WITH SEVERE LVH  ESTIMATED EF: >55%  ELEVATED LA PRESSURES WITH DIASTOLIC DYSFUNCTION (GRADE 2)  NORMAL RIGHT VENTRICULAR SYSTOLIC FUNCTION  VALVULAR REGURGITATION: TRIVIAL AR, TRIVIAL MR, TRIVIAL PR, MILD TR  ESTIMATED RVSP: 52 mmHg (ABNORMAL)  VALVULAR STENOSIS: No AS, MILD MS, No PS, No TS   Prior to Admission medications   Medication Sig Start Date End Date Taking? Authorizing Provider  allopurinol  (ZYLOPRIM ) 300 MG tablet Take 100 mg by mouth daily.    [provider]  aspirin  EC 81 MG EC tablet Take 1 tablet (81 mg total) by mouth daily. 10/25/18   Tobie Yetta HERO, MD  Cholecalciferol  50 MCG  (2000 UT) CAPS Take 2,000 Units by mouth daily.    [provider]  Cyanocobalamin (VITAMIN B-12 PO) Take 1 tablet by mouth daily.    [provider]  dapagliflozin propanediol (FARXIGA) 10 MG TABS tablet Take 10 mg by mouth daily.    [provider]  diclofenac  Sodium (VOLTAREN ) 1 % GEL Apply 1 application topically as needed for pain (pain).    [provider]  disopyramide  (NORPACE ) 100 MG capsule 1 po tid 05/11/23   Antonio Meth, Yvonne R, DO  ferrous sulfate  325 (65 FE) MG EC tablet Take 1 tablet by mouth daily. 07/11/19   [provider]  fluticasone  furoate-vilanterol (BREO ELLIPTA ) 100-25 MCG/ACT AEPB Inhale 1 puff into the lungs daily.    [provider]  fluticasone  furoate-vilanterol (BREO ELLIPTA ) 200-25 MCG/ACT AEPB Inhale 1 puff into the lungs daily. 08/16/23   Antonio Meth [name] [name], DO  furosemide  (LASIX ) 40 MG tablet Take 1 tablet (40 mg total) by mouth daily. 08/07/23   Antonio Meth [name] R, DO  gabapentin  (NEURONTIN ) 100 MG capsule TAKE 2 CAPSULES BY MOUTH DAILY 08/17/23   Antonio Meth, Yvonne R, DO  Garlic 1000 MG CAPS Take 1,000 mg by mouth daily.    [provider]  ipratropium-albuterol  (DUONEB) 0.5-2.5 (3) MG/3ML SOLN Inhale 3 mLs into the lungs every 4 (four) hours as needed for wheezing (wheezing).    Caney, [place],  MD  JANUVIA 25 MG tablet Take 25 mg by mouth daily. 04/18/23   [provider]  Lactobacillus [facility]) CAPS Take 1 capsule by mouth daily as needed. 01/21/21   [provider]  LANTUS  SOLOSTAR 100 UNIT/ML Solostar Pen Inject 26 Units into the skin at bedtime. 01/21/19   [provider]  metoprolol  succinate (TOPROL -XL) 50 MG 24 hr tablet TAKE 1 TABLET(50 MG) BY MOUTH DAILY 07/12/23   Revankar, Rajan R, MD  montelukast  (SINGULAIR ) 10 MG tablet Take 1 tablet (10 mg total) by mouth at bedtime. 01/08/23   Lowne [name], Yvonne R, DO  Multiple Vitamin (MULTIVITAMIN WITH MINERALS) TABS  tablet Take 1 tablet by mouth daily.    [provider]  Omega-3 Fatty Acids (FISH OIL) 1000 MG CAPS Take 1,000 mg by mouth 2 (two) times daily.     [provider]  OXYGEN  Inhale 2 L into the lungs continuous.    [provider]  polyethylene glycol (MIRALAX  / GLYCOLAX ) 17 g packet Take 17 g by mouth as needed for mild constipation.    [provider]  Probiotic Product (PROBIOTIC PO) Take 1 tablet by mouth daily in the afternoon.    [provider]  rosuvastatin  (CRESTOR ) 10 MG tablet Take 1 tablet (10 mg total) by mouth daily. 02/20/23   Antonio Cyndee [name] [name], DO  sertraline  (ZOLOFT ) 100 MG tablet 1 1/2 tab po every day 07/10/23   Antonio Cyndee, Yvonne R, DO  Suvorexant  (BELSOMRA ) 20 MG TABS TAKE 1 TABLET(20 MG) BY MOUTH AT BEDTIME AS NEEDED 07/12/23   Antonio Cyndee, Yvonne R, DO  Turmeric 500 MG CAPS Take 1 capsule by mouth daily.    [provider]  VENTOLIN  HFA 108 (90 Base) MCG/ACT inhaler Inhale 2 puffs into the lungs every 6 (six) hours as needed for wheezing or shortness of breath.  07/03/18   [provider]    Allergies: Atorvastatin, Ciprofloxacin, Ciprocinonide [fluocinolone], Catapres [clonidine hcl], Clonidine, and Demadex [torsemide]    Review of Systems  Respiratory:  Positive for shortness of breath.     Updated Vital Signs BP 139/61   Pulse 76   Temp 98.7 F (37.1 C) (Oral)   Resp 19   Ht 5' 3 (1.6 m)   Wt 134.6 kg   LMP  (LMP Unknown)   SpO2 93%   BMI 52.58 kg/m   Physical Exam Vitals and nursing note reviewed.  Constitutional:      General: She is not in acute distress.    Appearance: She is well-developed.  HENT:     Head: Normocephalic and atraumatic.  Eyes:     Conjunctiva/sclera: Conjunctivae normal.  Cardiovascular:     Rate and Rhythm: Normal rate and regular rhythm.     Heart sounds: No murmur heard. Pulmonary:     Effort: Pulmonary effort is normal. No respiratory distress.     Breath  sounds: Decreased breath sounds present.  Abdominal:     Palpations: Abdomen is soft.     Tenderness: There is no abdominal tenderness.  Musculoskeletal:        General: No swelling.     Cervical back: Neck supple.     Right lower leg: Edema present.     Left lower leg: Edema present.  Skin:    General: Skin is warm and dry.     Capillary Refill: Capillary refill takes less than 2 seconds.  Neurological:     Mental Status: She is alert.  Psychiatric:  Mood and Affect: Mood normal.     (all labs ordered are listed, but only abnormal results are displayed) Labs Reviewed  RESP PANEL BY RT-PCR (RSV, FLU A&B, COVID)  RVPGX2 - Abnormal; Notable for the following components:      Result Value   SARS Coronavirus 2 by RT PCR POSITIVE (*)    All other components within normal limits  CBC WITH DIFFERENTIAL/PLATELET - Abnormal; Notable for the following components:   RBC 3.86 (*)    Hemoglobin 10.3 (*)    HCT 35.2 (*)    MCHC 29.3 (*)    RDW 17.9 (*)    Platelets 104 (*)    All other components within normal limits  PRO BRAIN NATRIURETIC PEPTIDE - Abnormal; Notable for the following components:   Pro Brain Natriuretic Peptide 22,304.0 (*)    All other components within normal limits  COMPREHENSIVE METABOLIC PANEL WITH GFR - Abnormal; Notable for the following components:   Sodium 146 (*)    Chloride 97 (*)    CO2 35 (*)    Glucose, Bld 183 (*)    BUN 45 (*)    Creatinine, Ser 1.50 (*)    AST 48 (*)    GFR, Estimated 36 (*)    All other components within normal limits  I-STAT VENOUS BLOOD GAS, ED - Abnormal; Notable for the following components:   pCO2, Ven 88.6 (*)    Bicarbonate 42.3 (*)    TCO2 45 (*)    Acid-Base Excess 12.0 (*)    Potassium 3.2 (*)    Calcium , Ion 1.11 (*)    HCT 34.0 (*)    Hemoglobin 11.6 (*)    All other components within normal limits  I-STAT VENOUS BLOOD GAS, ED - Abnormal; Notable for the following components:   pCO2, Ven 78.0 (*)     Bicarbonate 43.0 (*)    TCO2 45 (*)    Acid-Base Excess 15.0 (*)    Calcium , Ion 1.13 (*)    HCT 30.0 (*)    Hemoglobin 10.2 (*)    All other components within normal limits    EKG: EKG Interpretation Date/Time:  Sunday August 26 2023 12:35:24 EDT Ventricular Rate:  94 PR Interval:  169 QRS Duration:  108 QT Interval:  415 QTC Calculation: 519 R Axis:   111  Text Interpretation: Sinus rhythm Right axis deviation Prolonged QT interval no changes compared with prior Confirmed by Simon Rea [phone]) on 08/26/2023 12:42:09 PM  Radiology: [facility] Chest Port 1 View Result Date: 08/26/2023 CLINICAL DATA:  Shortness of breath. EXAM: PORTABLE CHEST 1 VIEW COMPARISON:  08/16/2023. FINDINGS: Patient is rotated to the left. Unchanged marked enlargement of the cardiopericardial silhouette with pulmonary vascular congestion and possible interstitial edema. No large pleural effusion. No pneumothorax. No acute osseous abnormality. IMPRESSION: Marked cardiomegaly with pulmonary vascular congestion and suspected interstitial edema, similar to the prior exam. Electronically Signed   By: Harrietta Sherry M.D.   On: 08/26/2023 12:57     Procedures   Medications Ordered in the ED  ipratropium-albuterol  (DUONEB) 0.5-2.5 (3) MG/3ML nebulizer solution 3 mL (3 mLs Nebulization Given 08/26/23 1227)  methylPREDNISolone  sodium succinate (SOLU-MEDROL ) 125 mg/2 mL injection 125 mg (125 mg Intravenous Given 08/26/23 1227)  furosemide  (LASIX ) injection 40 mg (40 mg Intravenous Given 08/26/23 1350)  Medical Decision Making Amount and/or Complexity of Data Reviewed Labs: ordered. Radiology: ordered.  Risk Prescription drug management. Decision regarding hospitalization.   Patient presents because of increasing shortness of breath.  Patient states has been having a nonproductive cough over the past week or so.  Patient states that she is usually on 2 L of nasal cannula but over  the past couple weeks she has been increasing her nasal cannula to 3 L nasal cannula.  Patient states is compliant with her 80 mg of Lasix  daily.  Endorses bilateral lower leg swelling that seems to be getting worse as well.  Denies chest pain.  No clear chest pain.  No hemoptysis.  No history of DVT or PE.  No fever no chills.  No sick contacts that she is aware of.  Previous medical history reviewed : Follow-up with family medicine on August 14.  Patient was complained about worsening shortness of breath at that time.  There was concern that she was having worsening heart failure exacerbation.  Was increased on her Lasix  dose at that time.  Last admission was in April 2025.  Was admitted because of acute on chronic respiratory failure with hypercapnia.  The last echo that I can is from August 2025:    HYPERCONTRACTILE LEFT VENTRICULAR FUNCTION WITH SEVERE LVH  ESTIMATED EF: >55%  ELEVATED LA PRESSURES WITH DIASTOLIC DYSFUNCTION (GRADE 2)  NORMAL RIGHT VENTRICULAR SYSTOLIC FUNCTION  VALVULAR REGURGITATION: TRIVIAL AR, TRIVIAL MR, TRIVIAL PR, MILD TR  ESTIMATED RVSP: 52 mmHg (ABNORMAL)  VALVULAR STENOSIS: No AS, MILD MS, No PS, No TS      Upon exam, patient initially satting in the low 80s.  Placed was placed on 5 L nasal cannula with increased to O2 saturation of approximately 89 to 90%.  Patient has diminished breath sounds bilaterally.  Difficult for auscultation.  Unclear whether or not diminished breath sounds because of exacerbation of COPD versus difficulty with body habitus.  She has 2+ pitting edema lower extremity bilaterally.   Obtain laboratory workup.  No significant leukocytosis.  Slight increase in thrombocytopenia of unclear etiology.  Microcytic anemia which is around patient's baseline.  Patient also has a creatinine 1.5.  Baseline around 1.3.   BNP elevated to 22,000.  Patient also shows cardiomegaly on chest x-ray which is consistent with previous echo findings of LVH.   Given elevated BNP, pulm edema seen on checks x-ray, started patient on Lasix  40 mg IV.  Has been taken to milligrams p.o.  Will try to further diurese the patient here because I think this could be contribute the patient symptoms.   Obtain VBG yesterday when she first arrived.  CO2 88.  pH low and normal.  Bicarb elevated at 42.3.  Pretty well compensated.  Did start patient on short course of BiPAP.  She uses BiPAP at home at night and sometimes during the day.  Will recheck VBG.  After an hour BiPAP.  Recheck VBG.  Hypercapnia improved somewhat with pH of 7.349.  CO2 of 78.  Once again, looks well compensated and probably lives with his CO2 in the 70s to low 80s.   Patient tested positive for COVID-19.  Explained patient's cough.  Probably exacerbating her underlying chronic medical comorbidities as well leading to this presentation.  No chest pain.  No prior chest pain.  No hemoptysis.  No history of DVT or PE.  No unilateral leg swelling.  My suspicion for PE is very low at this time.  No indication to start  antibiotics at this time.  No concerns for bacterial pneumonia.   Patient looks more comfortable on BiPAP so we will relieve this on for the time being         Final diagnoses:  COPD exacerbation (HCC)  Respiratory distress  COVID-19  Chronic pulmonary edema    ED Discharge Orders     None          Simon Lavonia SAILOR, MD 08/26/23 1459

## 2023-08-26 NOTE — Progress Notes (Signed)
   08/26/23 2325  BiPAP/CPAP/SIPAP  BiPAP/CPAP/SIPAP Pt Type Adult  BiPAP/CPAP/SIPAP SERVO  Mask Type Full face mask  Dentures removed? Not applicable  Mask Size Medium  Set Rate 16 breaths/min  Respiratory Rate 24 breaths/min  IPAP 22 cmH20  EPAP 14 cmH2O  Pressure Support 8 cmH20  PEEP 14 cmH20  FiO2 (%) 40 %  Minute Ventilation 10.5  Leak 32  Peak Inspiratory Pressure (PIP) 22  Tidal Volume (Vt) 487  Patient Home Machine No  Patient Home Mask No  Patient Home Tubing No  Auto Titrate No  Press High Alarm 25 cmH2O  Press Low Alarm 4 cmH2O  Device Plugged into [employer] Power Outlet Yes

## 2023-08-26 NOTE — ED Notes (Signed)
 ED TO INPATIENT HANDOFF REPORT  ED Nurse Name and Phone #: Rowena Berry OBIE GORMAN Name/Age/Gender [place] 73 y.o. female Room/Bed: MH11/MH11  Code Status   Code Status: Prior  Home/SNF/Other Home Patient oriented to: self, place, time, and situation Is this baseline? Yes   Triage Complete: Triage complete  Chief Complaint Respiratory failure (HCC) [J96.90]  Triage Note Pt taken to Rm 11 d/t obvious resp distress; placed on 3L O2 (pt's normal rate) and O2 sats 78%; increased O2 to 5L, sats in low 80s; RT at bedside   Allergies Allergies  Allergen Reactions   Atorvastatin Anaphylaxis and Swelling    Lip swelling    Ciprofloxacin Anaphylaxis    Kidney failure   Ciprocinonide [Fluocinolone] Other (See Comments)    Kidney failure   Catapres [Clonidine Hcl] Rash   Clonidine Rash   Demadex [Torsemide] Rash    Level of Care/Admitting Diagnosis ED Disposition     ED Disposition  Admit   Condition  --   Comment  Hospital Area: [facility] [[id number]]  Level of Care: Stepdown [14]  Admit to SDU based on following criteria: Cardiac Instability:  Patients experiencing chest pain, unconfirmed MI and stable, arrhythmias and CHF requiring medical management and potentially compromising patient's stability  May admit patient to [facility] or [facility] if equivalent level of care is available:: No  Interfacility transfer: Yes  Covid Evaluation: Confirmed COVID Positive  Diagnosis: Respiratory failure [facility]) [[id number]]  Admitting Physician: REGALADO, BELKYS A [phone]  Attending Physician: REGALADO, BELKYS A [phone]  Certification:: I certify this patient will need inpatient services for at least 2 midnights  Expected Medical Readiness: 08/29/2023          B Medical/Surgery History Past Medical History:  Diagnosis Date   Acute diastolic heart failure (HCC) 07/17/2019   Acute hypercapnic respiratory failure (HCC) 07/21/2016   Acute on chronic congestive  heart failure (HCC) 07/19/2016   Acute on chronic diastolic CHF (congestive heart failure) (HCC) 03/08/2020   Acute on chronic respiratory failure with hypoxia (HCC) 10/20/2018   Acute on chronic respiratory failure with hypoxia and hypercapnia (HCC) 03/08/2020   Acute respiratory acidosis (HCC) 07/19/2016   Acute respiratory distress 07/19/2016   Acute respiratory failure (HCC) 03/08/2020   Anxiety 03/15/2020   Aortic stenosis 11/01/2018   Arthritis    Asthma    Atherosclerosis of native coronary artery of native heart without angina pectoris 03/17/2016   Cardiomyopathy (HCC) 03/15/2020   Chest pain 07/14/2019   CHF (congestive heart failure) (HCC)    Chronic diastolic heart failure (HCC) 03/17/2016   Chronic hypoxemic respiratory failure (HCC)    Chronic kidney disease, stage 3b (HCC) 02/04/2021   CKD (chronic kidney disease)    COPD (chronic obstructive pulmonary disease) (HCC)    COPD with acute exacerbation (HCC) 03/08/2020   Diabetes mellitus without complication (HCC)    Drug allergy 07/21/2016   Essential (primary) hypertension 11/19/2016   Essential hypertension 11/19/2016   Gout 11/19/2016   Heart block AV complete (HCC)    High cholesterol    History of gout 04/05/2023   HOCM (hypertrophic obstructive cardiomyopathy) (HCC) 09/18/2017   Hyperlipidemia associated with type 2 diabetes mellitus (HCC) 11/23/2022   Hyperlipidemia, unspecified 03/15/2020   Hypertension    Hypertensive heart disease with heart failure (HCC) 03/17/2016   Hypertensive heart disease without congestive heart failure 03/15/2020   Hypertrophic cardiomyopathy (HCC) 04/12/2020   Hypokalemia 08/24/2015   Hypomagnesemia    Hypoxia 03/17/2016  Insomnia 11/23/2022   Iron deficiency 03/15/2020   Leukocytosis 07/19/2016   Long term (current) use of insulin  (HCC) 02/04/2021   Mild intermittent asthma 02/04/2021   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   Mixed hyperlipidemia 03/15/2020   Morbid obesity  (HCC)    Morbid obesity with BMI of 60.0-69.9, adult (HCC) 07/21/2016   Need for influenza vaccination 11/23/2022   Need for pneumococcal 20-valent conjugate vaccination 11/23/2022   Nonrheumatic aortic valve stenosis 03/17/2016   Obesity hypoventilation syndrome (HCC) 03/08/2020   Obstructive sleep apnea 11/19/2016   Osteoarthritis of knee 03/15/2020   Panniculitis 08/23/2015   Peripheral venous insufficiency 03/15/2020   Pure hypercholesterolemia 02/13/2017   SOB (shortness of breath) 08/24/2015   Troponin level elevated 08/24/2015   Type 2 diabetes mellitus with hyperglycemia (HCC) 07/19/2016   Type 2 diabetes mellitus with stage 3 chronic kidney disease, with long-term current use of insulin  (HCC) 11/19/2016   Type 2 diabetes mellitus without complication, without long-term current use of insulin  (HCC) 04/12/2020   Type 2 diabetes mellitus without complications (HCC) 03/15/2020   Type 2 diabetes mellitus, without long-term current use of insulin  (HCC) 11/19/2016   Past Surgical History:  Procedure Laterality Date   ABDOMINAL HYSTERECTOMY     cellulitis debridement  2000   abdomen   CESAREAN SECTION       A IV Location/Drains/Wounds Patient Lines/Drains/Airways Status     Active Line/Drains/Airways     Name Placement date Placement time Site Days   Peripheral IV 08/26/23 20 G Left Antecubital 08/26/23  1227  Antecubital  less than 1   External Urinary Catheter 08/26/23  1304  --  less than 1            Intake/Output Last 24 hours No intake or output data in the 24 hours ending 08/26/23 1418  Labs/Imaging Results for orders placed or performed during the hospital encounter of 08/26/23 (from the past 48 hours)  I-Stat venous blood gas, ([facility], [facility], [facility])     Status: Abnormal   Collection Time: 08/26/23 12:16 PM  Result Value Ref Range   pH, Ven 7.287 7.25 - 7.43   pCO2, Ven 88.6 (HH) 44 - 60 mmHg   pO2, Ven 42 32 - 45 mmHg   Bicarbonate 42.3 (H) 20.0 - 28.0  mmol/L   TCO2 45 (H) 22 - 32 mmol/L   O2 Saturation 67 %   Acid-Base Excess 12.0 (H) 0.0 - 2.0 mmol/L   Sodium 142 135 - 145 mmol/L   Potassium 3.2 (L) 3.5 - 5.1 mmol/L   Calcium , Ion 1.11 (L) 1.15 - 1.40 mmol/L   HCT 34.0 (L) 36.0 - 46.0 %   Hemoglobin 11.6 (L) 12.0 - 15.0 g/dL   Sample type VENOUS    Comment NOTIFIED PHYSICIAN   CBC with Differential     Status: Abnormal   Collection Time: 08/26/23 12:17 PM  Result Value Ref Range   WBC 8.5 4.0 - 10.5 K/uL   RBC 3.86 (L) 3.87 - 5.11 MIL/uL   Hemoglobin 10.3 (L) 12.0 - 15.0 g/dL   HCT 64.7 (L) 63.9 - 53.9 %   MCV 91.2 80.0 - 100.0 fL   MCH 26.7 26.0 - 34.0 pg   MCHC 29.3 (L) 30.0 - 36.0 g/dL   RDW 82.0 (H) 88.4 - 84.4 %   Platelets 104 (L) 150 - 400 K/uL   nRBC 0.0 0.0 - 0.2 %   Neutrophils Relative % 79 %   Neutro Abs 6.7 1.7 - 7.7  K/uL   Lymphocytes Relative 9 %   Lymphs Abs 0.8 0.7 - 4.0 K/uL   Monocytes Relative 11 %   Monocytes Absolute 0.9 0.1 - 1.0 K/uL   Eosinophils Relative 0 %   Eosinophils Absolute 0.0 0.0 - 0.5 K/uL   Basophils Relative 0 %   Basophils Absolute 0.0 0.0 - 0.1 K/uL   Immature Granulocytes 1 %   Abs Immature Granulocytes 0.05 0.00 - 0.07 K/uL    Comment: Performed at [facility], [address] [place], [place] [address]  Brain natriuretic peptide     Status: Abnormal   Collection Time: 08/26/23 12:17 PM  Result Value Ref Range   Pro Brain Natriuretic Peptide 22,304.0 (H) <300.0 pg/mL    Comment: (NOTE) Age Group        Cut-Points    Interpretation  < 50 years     450 pg/mL       NT-proBNP > 450 pg/mL indicates                                ADHF is likely              50 to 75 years  900 pg/mL      NT-proBNP > 900 pg/mL indicates          ADHF is likely  > 75 years      1800 pg/mL     NT-proBNP > 1800 pg/mL indicates          ADHF is likely                           All ages    Results between       Indeterminate. Further clinical             300 and the cut-   information  is needed to determine            point for age group   if ADHF is present.                                                             Elecsys proBNP II/ Elecsys proBNP II STAT           Cut-Point                       Interpretation  300 pg/mL                    NT-proBNP <300pg/mL indicates                             ADHF is not likely  Performed at [facility], [address] [place], [place] [address]   Resp panel by RT-PCR (RSV, Flu A&B, Covid) Anterior Nasal Swab     Status: Abnormal   Collection Time: 08/26/23 12:17 PM   Specimen: Anterior Nasal Swab  Result Value Ref Range   SARS Coronavirus 2 by RT PCR POSITIVE (A) NEGATIVE    Comment: (NOTE) SARS-CoV-2 target nucleic acids are DETECTED.  The SARS-CoV-2 RNA is generally  detectable in upper respiratory specimens during the acute phase of infection. Positive results are indicative of the presence of the identified virus, but do not rule out bacterial infection or co-infection with other pathogens not detected by the test. Clinical correlation with patient history and other diagnostic information is necessary to determine patient infection status. The expected result is Negative.  Fact Sheet for Patients: BloggerCourse.com  Fact Sheet for Healthcare Providers: SeriousBroker.it  This test is not yet approved or cleared by the [place]  FDA and  has been authorized for detection and/or diagnosis of SARS-CoV-2 by FDA under an Emergency Use Authorization (EUA).  This EUA will remain in effect (meaning this test can be used) for the duration of  the COVID-19 declaration under Section 564(b)(1) of the A ct, 21 U.S.C. section 360bbb-3(b)(1), unless the authorization is terminated or revoked sooner.     Influenza A by PCR NEGATIVE NEGATIVE   Influenza B by PCR NEGATIVE NEGATIVE    Comment: (NOTE) The Xpert Xpress SARS-CoV-2/FLU/RSV plus assay is  intended as an aid in the diagnosis of influenza from Nasopharyngeal swab specimens and should not be used as a sole basis for treatment. Nasal washings and aspirates are unacceptable for Xpert Xpress SARS-CoV-2/FLU/RSV testing.  Fact Sheet for Patients: BloggerCourse.com  Fact Sheet for Healthcare Providers: SeriousBroker.it  This test is not yet approved or cleared by the [place]  FDA and has been authorized for detection and/or diagnosis of SARS-CoV-2 by FDA under an Emergency Use Authorization (EUA). This EUA will remain in effect (meaning this test can be used) for the duration of the COVID-19 declaration under Section 564(b)(1) of the Act, 21 U.S.C. section 360bbb-3(b)(1), unless the authorization is terminated or revoked.     Resp Syncytial Virus by PCR NEGATIVE NEGATIVE    Comment: (NOTE) Fact Sheet for Patients: BloggerCourse.com  Fact Sheet for Healthcare Providers: SeriousBroker.it  This test is not yet approved or cleared by the [place]  FDA and has been authorized for detection and/or diagnosis of SARS-CoV-2 by FDA under an Emergency Use Authorization (EUA). This EUA will remain in effect (meaning this test can be used) for the duration of the COVID-19 declaration under Section 564(b)(1) of the Act, 21 U.S.C. section 360bbb-3(b)(1), unless the authorization is terminated or revoked.  Performed at [facility], [address] [place], [place] [address]   Comprehensive metabolic panel     Status: Abnormal   Collection Time: 08/26/23 12:17 PM  Result Value Ref Range   Sodium 146 (H) 135 - 145 mmol/L   Potassium 3.5 3.5 - 5.1 mmol/L    Comment: HEMOLYSIS AT THIS LEVEL MAY AFFECT RESULT   Chloride 97 (L) 98 - 111 mmol/L   CO2 35 (H) 22 - 32 mmol/L   Glucose, Bld 183 (H) 70 - 99 mg/dL    Comment: Glucose reference range applies only to samples  taken after fasting for at least 8 hours.   BUN 45 (H) 8 - 23 mg/dL   Creatinine, Ser 8.49 (H) 0.44 - 1.00 mg/dL   Calcium  9.5 8.9 - 10.3 mg/dL   Total Protein 6.8 6.5 - 8.1 g/dL   Albumin 4.1 3.5 - 5.0 g/dL   AST 48 (H) 15 - 41 U/L   ALT 33 0 - 44 U/L   Alkaline Phosphatase 57 38 - 126 U/L   Total Bilirubin 0.7 0.0 - 1.2 mg/dL   GFR, Estimated 36 (L) >60 mL/min    Comment: (NOTE) Calculated using the CKD-EPI Creatinine  Equation (2021)    Anion gap 13 5 - 15    Comment: Performed at [facility], [address] [place], [place] [address]  I-Stat venous blood gas, ED     Status: Abnormal   Collection Time: 08/26/23  2:01 PM  Result Value Ref Range   pH, Ven 7.349 7.25 - 7.43   pCO2, Ven 78.0 (HH) 44 - 60 mmHg   pO2, Ven 36 32 - 45 mmHg   Bicarbonate 43.0 (H) 20.0 - 28.0 mmol/L   TCO2 45 (H) 22 - 32 mmol/L   O2 Saturation 62 %   Acid-Base Excess 15.0 (H) 0.0 - 2.0 mmol/L   Sodium 143 135 - 145 mmol/L   Potassium 3.5 3.5 - 5.1 mmol/L   Calcium , Ion 1.13 (L) 1.15 - 1.40 mmol/L   HCT 30.0 (L) 36.0 - 46.0 %   Hemoglobin 10.2 (L) 12.0 - 15.0 g/dL   Sample type VENOUS    Comment NOTIFIED PHYSICIAN    DG Chest Port 1 View Result Date: 08/26/2023 CLINICAL DATA:  Shortness of breath. EXAM: PORTABLE CHEST 1 VIEW COMPARISON:  08/16/2023. FINDINGS: Patient is rotated to the left. Unchanged marked enlargement of the cardiopericardial silhouette with pulmonary vascular congestion and possible interstitial edema. No large pleural effusion. No pneumothorax. No acute osseous abnormality. IMPRESSION: Marked cardiomegaly with pulmonary vascular congestion and suspected interstitial edema, similar to the prior exam. Electronically Signed   By: Harrietta Sherry M.D.   On: 08/26/2023 12:57    Pending Labs Unresulted Labs (From admission, onward)    None       Vitals/Pain Today's Vitals   08/26/23 1251 08/26/23 1300 08/26/23 1324 08/26/23 1400  BP:  (!) 120/49  (!) 136/54  Pulse:   81  76  Resp:  (!) 21  (!) 21  Temp:      TempSrc:      SpO2: 91% 90%  95%  Weight:      Height:      PainSc:   4      Isolation Precautions No active isolations  Medications Medications  ipratropium-albuterol  (DUONEB) 0.5-2.5 (3) MG/3ML nebulizer solution 3 mL (3 mLs Nebulization Given 08/26/23 1227)  methylPREDNISolone  sodium succinate (SOLU-MEDROL ) 125 mg/2 mL injection 125 mg (125 mg Intravenous Given 08/26/23 1227)  furosemide  (LASIX ) injection 40 mg (40 mg Intravenous Given 08/26/23 1350)    Mobility walks with person assist     Focused Assessments Cardiac Assessment Handoff:  Cardiac Rhythm: Normal sinus rhythm Lab Results  Component Value Date   CKTOTAL 46 03/08/2020   TROPONINI <0.03 08/24/2015   Lab Results  Component Value Date   DDIMER 0.54 (H) 03/08/2020   Does the Patient currently have chest pain? No   , Pulmonary Assessment Handoff:  Lung sounds: Bilateral Breath Sounds: Diminished O2 Device: ETCO2 Nasal Cannula O2 Flow Rate (L/min): 5 L/min    R Recommendations: See Admitting Provider Note  Report given to:   Additional Notes:  Pt is on BiPap. Pt is Covid+. Pt wears oxygen  at 2-3 L at home. Cough x 1 week.

## 2023-08-26 NOTE — ED Notes (Signed)
 Called [facility] for transport.

## 2023-08-26 NOTE — H&P (Addendum)
 History and Physical    Patient: Morgan Fischer FMW:[id number] DOB: 1950/04/14 DOA: 08/26/2023 DOS: the patient was seen and examined on 08/26/2023 PCP: Antonio Cyndee [name] [name], DO  Patient coming from: Home  Chief Complaint:  Chief Complaint  Patient presents with   Shortness of Breath   HPI: Morgan Fischer is a 73 y.o. female with medical history significant of with past medical history significant for morbid obesity, hypertrophic cardiomyopathy for which patient is on Norpace  and was recently seen at [facility], chronic hypoxemic and hypercapnic respiratory failure with 2 L of oxygen  and BiPAP at nighttime, asthma/COPD, type II DM, CKD 3A, who presents for symptoms of coughing and shortness of breath for the past 4 to 5 days.  She denies any symptoms of fever, chills, sputum production, chest pain, palpitations.  Does report increased shortness of breath over the past few days.  Does not report any worsening swelling of the legs.  Says that she has been compliant with her home medications including Lasix .  She does report symptoms of altered taste in her mouth, reports slight nausea but no vomiting.  She was initially seen at [facility] in [facility], he was found to be hypoxic, oxygen  saturations in the low 80s, initially placed on 5 L of oxygen , however was subsequently placed on BiPAP that seem to have helped.  Her initial VBG shows a pH of 7.28, pCO2 58.6, bicarb of 42.3.  Her WBC count was normal.  Hemoglobin 10.3.  Platelets 104.  Sodium 146.  BUN 45 and creatinine 1.50.  proBNP was elevated at 22,000.  Chest x-ray showed significant cardiomegaly with pulmonary vascular congestion and suspected interstitial edema, similar to the prior exam.  SHe was COVID-19 positive.  Review of Systems: As mentioned in the history of present illness. All other systems reviewed and are negative. Past Medical History:  Diagnosis Date   Acute diastolic heart failure (HCC) 07/17/2019   Acute hypercapnic  respiratory failure (HCC) 07/21/2016   Acute on chronic congestive heart failure (HCC) 07/19/2016   Acute on chronic diastolic CHF (congestive heart failure) (HCC) 03/08/2020   Acute on chronic respiratory failure with hypoxia (HCC) 10/20/2018   Acute on chronic respiratory failure with hypoxia and hypercapnia (HCC) 03/08/2020   Acute respiratory acidosis (HCC) 07/19/2016   Acute respiratory distress 07/19/2016   Acute respiratory failure (HCC) 03/08/2020   Anxiety 03/15/2020   Aortic stenosis 11/01/2018   Arthritis    Asthma    Atherosclerosis of native coronary artery of native heart without angina pectoris 03/17/2016   Cardiomyopathy (HCC) 03/15/2020   Chest pain 07/14/2019   CHF (congestive heart failure) (HCC)    Chronic diastolic heart failure (HCC) 03/17/2016   Chronic hypoxemic respiratory failure (HCC)    Chronic kidney disease, stage 3b (HCC) 02/04/2021   CKD (chronic kidney disease)    COPD (chronic obstructive pulmonary disease) (HCC)    COPD with acute exacerbation (HCC) 03/08/2020   Diabetes mellitus without complication (HCC)    Drug allergy 07/21/2016   Essential (primary) hypertension 11/19/2016   Essential hypertension 11/19/2016   Gout 11/19/2016   Heart block AV complete (HCC)    High cholesterol    History of gout 04/05/2023   HOCM (hypertrophic obstructive cardiomyopathy) (HCC) 09/18/2017   Hyperlipidemia associated with type 2 diabetes mellitus (HCC) 11/23/2022   Hyperlipidemia, unspecified 03/15/2020   Hypertension    Hypertensive heart disease with heart failure (HCC) 03/17/2016   Hypertensive heart disease without congestive heart failure 03/15/2020   Hypertrophic  cardiomyopathy (HCC) 04/12/2020   Hypokalemia 08/24/2015   Hypomagnesemia    Hypoxia 03/17/2016   Insomnia 11/23/2022   Iron deficiency 03/15/2020   Leukocytosis 07/19/2016   Long term (current) use of insulin  (HCC) 02/04/2021   Mild intermittent asthma 02/04/2021   Mitral valve  disorder 03/15/2020   Mixed hyperlipidemia 03/15/2020   Morbid obesity (HCC)    Morbid obesity with BMI of 60.0-69.9, adult (HCC) 07/21/2016   Need for influenza vaccination 11/23/2022   Need for pneumococcal 20-valent conjugate vaccination 11/23/2022   Nonrheumatic aortic valve stenosis 03/17/2016   Obesity hypoventilation syndrome (HCC) 03/08/2020   Obstructive sleep apnea 11/19/2016   Osteoarthritis of knee 03/15/2020   Panniculitis 08/23/2015   Peripheral venous insufficiency 03/15/2020   Pure hypercholesterolemia 02/13/2017   SOB (shortness of breath) 08/24/2015   Troponin level elevated 08/24/2015   Type 2 diabetes mellitus with hyperglycemia (HCC) 07/19/2016   Type 2 diabetes mellitus with stage 3 chronic kidney disease, with long-term current use of insulin  (HCC) 11/19/2016   Type 2 diabetes mellitus without complication, without long-term current use of insulin  (HCC) 04/12/2020   Type 2 diabetes mellitus without complications (HCC) 03/15/2020   Type 2 diabetes mellitus, without long-term current use of insulin  (HCC) 11/19/2016   Past Surgical History:  Procedure Laterality Date   ABDOMINAL HYSTERECTOMY     cellulitis debridement  2000   abdomen   CESAREAN SECTION     Social History:  reports that she has never smoked. She has never used smokeless tobacco. She reports that she does not drink alcohol and does not use drugs.  Allergies  Allergen Reactions   Atorvastatin Anaphylaxis and Swelling    Lip swelling    Ciprofloxacin Anaphylaxis    Kidney failure   Ciprocinonide [Fluocinolone] Other (See Comments)    Kidney failure   Catapres [Clonidine Hcl] Rash   Clonidine Rash   Demadex [Torsemide] Rash    Family History  Problem Relation Age of Onset   Diabetes Mellitus I Mother    CAD Mother    Diabetes Mother    Heart disease Father    Cancer Father    Breast cancer Sister    Cancer Sister    Heart disease Sister    Prostate cancer Brother     Prior to  Admission medications   Medication Sig Start Date End Date Taking? Authorizing Provider  allopurinol  (ZYLOPRIM ) 300 MG tablet Take 100 mg by mouth daily.    [provider]  aspirin  EC 81 MG EC tablet Take 1 tablet (81 mg total) by mouth daily. 10/25/18   Tobie Yetta HERO, MD  Cholecalciferol  50 MCG (2000 UT) CAPS Take 2,000 Units by mouth daily.    [provider]  Cyanocobalamin (VITAMIN B-12 PO) Take 1 tablet by mouth daily.    [provider]  dapagliflozin propanediol (FARXIGA) 10 MG TABS tablet Take 10 mg by mouth daily.    [provider]  diclofenac  Sodium (VOLTAREN ) 1 % GEL Apply 1 application topically as needed for pain (pain).    [provider]  disopyramide  (NORPACE ) 100 MG capsule 1 po tid 05/11/23   Antonio Meth, Yvonne R, DO  ferrous sulfate  325 (65 FE) MG EC tablet Take 1 tablet by mouth daily. 07/11/19   [provider]  fluticasone  furoate-vilanterol (BREO ELLIPTA ) 100-25 MCG/ACT AEPB Inhale 1 puff into the lungs daily.    [provider]  fluticasone  furoate-vilanterol (BREO ELLIPTA ) 200-25 MCG/ACT AEPB Inhale 1 puff into the lungs daily.  08/16/23   Antonio Cyndee [name] [name], DO  furosemide  (LASIX ) 40 MG tablet Take 1 tablet (40 mg total) by mouth daily. 08/07/23   Antonio Cyndee [name] R, DO  gabapentin  (NEURONTIN ) 100 MG capsule TAKE 2 CAPSULES BY MOUTH DAILY 08/17/23   Antonio Cyndee, Yvonne R, DO  Garlic 1000 MG CAPS Take 1,000 mg by mouth daily.    [provider]  ipratropium-albuterol  (DUONEB) 0.5-2.5 (3) MG/3ML SOLN Inhale 3 mLs into the lungs every 4 (four) hours as needed for wheezing (wheezing).    Beauford, Lemond, MD  JANUVIA 25 MG tablet Take 25 mg by mouth daily. 04/18/23   [provider]  Lactobacillus [facility] WOMEN) CAPS Take 1 capsule by mouth daily as needed. 01/21/21   [provider]  LANTUS  SOLOSTAR 100 UNIT/ML Solostar Pen Inject 26 Units into the skin at bedtime. 01/21/19   [provider]  metoprolol  succinate (TOPROL -XL) 50 MG 24 hr tablet TAKE 1 TABLET(50 MG) BY MOUTH DAILY 07/12/23   Revankar, Rajan R, MD  montelukast  (SINGULAIR ) 10 MG tablet Take 1 tablet (10 mg total) by mouth at bedtime. 01/08/23   Lowne [name], Yvonne R, DO  Multiple Vitamin (MULTIVITAMIN WITH MINERALS) TABS tablet Take 1 tablet by mouth daily.    [provider]  Omega-3 Fatty Acids (FISH OIL) 1000 MG CAPS Take 1,000 mg by mouth 2 (two) times daily.     [provider]  OXYGEN  Inhale 2 L into the lungs continuous.    [provider]  polyethylene glycol (MIRALAX  / GLYCOLAX ) 17 g packet Take 17 g by mouth as needed for mild constipation.    [provider]  Probiotic Product (PROBIOTIC PO) Take 1 tablet by mouth daily in the afternoon.    [provider]  rosuvastatin  (CRESTOR ) 10 MG tablet Take 1 tablet (10 mg total) by mouth daily. 02/20/23   Lowne [name], Yvonne R, DO  sertraline  (ZOLOFT ) 100 MG tablet 1 1/2 tab po every day 07/10/23   Antonio Cyndee, Yvonne R, DO  Suvorexant  (BELSOMRA ) 20 MG TABS TAKE 1 TABLET(20 MG) BY MOUTH AT BEDTIME AS NEEDED 07/12/23   Antonio Cyndee, Yvonne R, DO  Turmeric 500 MG CAPS Take 1 capsule by mouth daily.    [provider]  VENTOLIN  HFA 108 (90 Base) MCG/ACT inhaler Inhale 2 puffs into the lungs every 6 (six) hours as needed for wheezing or shortness of breath.  07/03/18   [provider]    Physical Exam: Vitals:   08/26/23 1430 08/26/23 1500 08/26/23 1550 08/26/23 1700  BP: 139/61 129/78  (!) 167/72  Pulse: 76 75 77 88  Resp: 19 20 18  (!) 27  Temp:    97.9 F (36.6 C)  TempSrc:    Axillary  SpO2: 93% 92% 90% 96%  Weight:    127.4 kg  Height:    5' 3 (1.6 m)   Physical Exam Constitutional:      Appearance: She is obese.     Comments: Patient is wearing BiPAP  HENT:     Head: Normocephalic and atraumatic.  Eyes:     Pupils: Pupils are equal, round, and reactive to light.  Cardiovascular:      Rate and Rhythm: Normal rate and regular rhythm.  Pulmonary:     Breath sounds: Examination of the right-lower field reveals decreased breath sounds and rales. Examination of the left-lower field reveals decreased breath sounds and rales. Decreased breath sounds and rales present.     Comments:  Patient appears to have somewhat diminished breath sounds bilaterally Chest:     Chest wall: No tenderness.  Abdominal:     Palpations: Abdomen is soft.     Tenderness: There is no abdominal tenderness. There is no guarding.  Musculoskeletal:        General: Normal range of motion.     Cervical back: Neck supple.     Right lower leg: Edema present.     Left lower leg: Edema present.  Skin:    General: Skin is warm.     Capillary Refill: Capillary refill takes less than 2 seconds.  Neurological:     General: No focal deficit present.     Mental Status: She is alert.  Psychiatric:        Mood and Affect: Mood normal.     Data Reviewed:  I personally reviewed patient's lab work including proBNP, CBC, CMP, VBG.  Personally reviewed patient's chest x-ray.  Assessment and Plan: No notes have been filed under this hospital service. Service: Hospitalist  #1.  Acute on chronic hypoxemic and hypercapnic respiratory failure/possible acute on chronic diastolic CHF.  Patient is currently on BiPAP.  She normally uses BiPAP only at nighttime, uses 2 L of oxygen  during the daytime.  Will try to wean off BiPAP in the daytime if possible.  Will go ahead and consult pulmonology.  Monitor respiratory status closely.  Incentive spirometry.  Patient has a history of hypertrophic cardiomyopathy, recent echocardiogram at [facility] showed: HYPERCONTRACTILE LEFT VENTRICULAR FUNCTION WITH SEVERE LVH  ESTIMATED EF: >55%  ELEVATED LA PRESSURES WITH DIASTOLIC DYSFUNCTION (GRADE 2)  NORMAL RIGHT VENTRICULAR SYSTOLIC FUNCTION  VALVULAR REGURGITATION: TRIVIAL AR, TRIVIAL MR, TRIVIAL PR, MILD TR  ESTIMATED RVSP: 52 mmHg  (ABNORMAL)  VALVULAR STENOSIS: No AS, MILD MS, No PS, No TS   Placed on low-dose IV Lasix , strict I's and O's, daily weights, cardiology consultation. Continue with Norpace , however monitor QT interval closely and await cardiology recommendations.  Continue with Toprol -XL.  Hold farxiga.   #2.  COPD exacerbation/COVID-19 positive.  Will start patient on IV Solu-Medrol  40 mg 3 times daily.  Will place on scheduled DuoNebs for now.  Pulmonology consultation.  Since patient is on continuous BiPAP, will hold off of remdesivir for now.  Will empirically cover with antibiotics, however will check procalcitonin and CT scan of the chest without contrast.  Consider discontinuing antibiotics if no significant evidence of bacterial infection on imaging.  #3.  Type 2 diabetes.  Will place on low-dose Lantus , insulin  sliding scale, monitor blood sugars closely.  Continue Januvia.  #4.  Hyperlipidemia.  Continue with statin.  #5.  Anxiety depression.  Continue with sertraline .  #6.  Morbid obesity.  7. CKD3a. Monitor renal function closely while on diuresis. Avoid nephrotoxic agents.    Advance Care Planning:   Code Status: Full Code   Consults: pulmonology, cardiology  Family Communication:   Severity of Illness: The appropriate patient status for this patient is INPATIENT. Inpatient status is judged to be reasonable and necessary in order to provide the required intensity of service to ensure the patient's safety. The patient's presenting symptoms, physical exam findings, and initial radiographic and laboratory data in the context of their chronic comorbidities is felt to place them at high risk for further clinical deterioration. Furthermore, it is not anticipated that the patient will be medically stable for discharge from the hospital within 2 midnights of admission.   * I certify that at the point of admission it is  my clinical judgment that the patient will require inpatient hospital care  spanning beyond 2 midnights from the point of admission due to high intensity of service, high risk for further deterioration and high frequency of surveillance required.*  Author: Breniyah Romm A Acie Custis, MD 08/26/2023 6:13 PM  For on call review [email or web address].

## 2023-08-26 NOTE — ED Notes (Signed)
[facility] at bedside to assume care of patient. 

## 2023-08-26 NOTE — ED Triage Notes (Signed)
 Pt taken to Rm 11 d/t obvious resp distress; placed on 3L O2 (pt's normal rate) and O2 sats 78%; increased O2 to 5L, sats in low 80s; RT at bedside

## 2023-08-26 NOTE — Consult Note (Addendum)
 NAME:  Morgan Fischer, MRN:  [id number], DOB:  1950/09/18, LOS: 0 ADMISSION DATE:  08/26/2023, CONSULTATION DATE: 08/26/2023 REFERRING MD: Dr. Redia, CHIEF COMPLAINT: Acute on chronic hypoxemic/hypercarbic respiratory failure  History of Present Illness:  Patient with known history of chronic respiratory failure on BiPAP at night for hypercapnic respiratory failure, history of chronic diastolic congestive heart failure Transferred from med [facility] where she had presented with worsening shortness of breath of about 5 days duration She has had a cough, no sputum production, no chest pain or chest discomfort No fevers or chills No contact with anyone with a febrile illness No change in bowel habits, no dysuria  Recently followed up at [facility] last Wednesday and prior to that visit was feeling fine but she felt that there may have been people who was sick in the waiting area  Normally on 2 L of oxygen  during the day but in the last few weeks oxygen  levels were increased to 3 L because of desaturations  History of hypertrophic cardiomyopathy, chronic kidney disease stage IIIa, type 2 diabetes asthma/COPD, morbid obesity, usual activity tolerance about 20-30 steps, never smoker  Has been compliant with her usual medications  Tested positive for COVID but does not recollect being around anybody with acute febrile illness  Pertinent  Medical History   Past Medical History:  Diagnosis Date   Acute diastolic heart failure (HCC) 07/17/2019   Acute hypercapnic respiratory failure (HCC) 07/21/2016   Acute on chronic congestive heart failure (HCC) 07/19/2016   Acute on chronic diastolic CHF (congestive heart failure) (HCC) 03/08/2020   Acute on chronic respiratory failure with hypoxia (HCC) 10/20/2018   Acute on chronic respiratory failure with hypoxia and hypercapnia (HCC) 03/08/2020   Acute respiratory acidosis (HCC) 07/19/2016   Acute respiratory distress 07/19/2016   Acute respiratory  failure (HCC) 03/08/2020   Anxiety 03/15/2020   Aortic stenosis 11/01/2018   Arthritis    Asthma    Atherosclerosis of native coronary artery of native heart without angina pectoris 03/17/2016   Cardiomyopathy (HCC) 03/15/2020   Chest pain 07/14/2019   CHF (congestive heart failure) (HCC)    Chronic diastolic heart failure (HCC) 03/17/2016   Chronic hypoxemic respiratory failure (HCC)    Chronic kidney disease, stage 3b (HCC) 02/04/2021   CKD (chronic kidney disease)    COPD (chronic obstructive pulmonary disease) (HCC)    COPD with acute exacerbation (HCC) 03/08/2020   Diabetes mellitus without complication (HCC)    Drug allergy 07/21/2016   Essential (primary) hypertension 11/19/2016   Essential hypertension 11/19/2016   Gout 11/19/2016   Heart block AV complete (HCC)    High cholesterol    History of gout 04/05/2023   HOCM (hypertrophic obstructive cardiomyopathy) (HCC) 09/18/2017   Hyperlipidemia associated with type 2 diabetes mellitus (HCC) 11/23/2022   Hyperlipidemia, unspecified 03/15/2020   Hypertension    Hypertensive heart disease with heart failure (HCC) 03/17/2016   Hypertensive heart disease without congestive heart failure 03/15/2020   Hypertrophic cardiomyopathy (HCC) 04/12/2020   Hypokalemia 08/24/2015   Hypomagnesemia    Hypoxia 03/17/2016   Insomnia 11/23/2022   Iron deficiency 03/15/2020   Leukocytosis 07/19/2016   Long term (current) use of insulin  (HCC) 02/04/2021   Mild intermittent asthma 02/04/2021   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   Mixed hyperlipidemia 03/15/2020   Morbid obesity (HCC)    Morbid obesity with BMI of 60.0-69.9, adult (HCC) 07/21/2016   Need for influenza vaccination 11/23/2022   Need for pneumococcal 20-valent conjugate vaccination 11/23/2022  Nonrheumatic aortic valve stenosis 03/17/2016   Obesity hypoventilation syndrome (HCC) 03/08/2020   Obstructive sleep apnea 11/19/2016   Osteoarthritis of knee 03/15/2020   Panniculitis  08/23/2015   Peripheral venous insufficiency 03/15/2020   Pure hypercholesterolemia 02/13/2017   SOB (shortness of breath) 08/24/2015   Troponin level elevated 08/24/2015   Type 2 diabetes mellitus with hyperglycemia (HCC) 07/19/2016   Type 2 diabetes mellitus with stage 3 chronic kidney disease, with long-term current use of insulin  (HCC) 11/19/2016   Type 2 diabetes mellitus without complication, without long-term current use of insulin  (HCC) 04/12/2020   Type 2 diabetes mellitus without complications (HCC) 03/15/2020   Type 2 diabetes mellitus, without long-term current use of insulin  (HCC) 11/19/2016     Significant Hospital Events: Including procedures, antibiotic start and stop dates in addition to other pertinent events   08/26/2023 chest x-ray with pulmonary vascular congestion  Interim History / Subjective:  Elderly lady, on BiPAP, appears comfortable Does not appear extra short of breath in conversation  Objective    Blood pressure (!) 167/72, pulse 88, temperature 97.9 F (36.6 C), temperature source Axillary, resp. rate (!) 27, height 5' 3 (1.6 m), weight 127.4 kg, SpO2 96%.    FiO2 (%):  [30 %-40 %] 40 % Pressure Support:  [5 cmH20-15 cmH20] 5 cmH20  No intake or output data in the 24 hours ending 08/26/23 1847 Filed Weights   08/26/23 1201 08/26/23 1700  Weight: 134.6 kg 127.4 kg    Examination: General: Elderly, does not appear to be in distress HENT: BiPAP mask in place Lungs: Decreased air movement bilaterally because of body habitus Cardiovascular: S1-S2 appreciated Abdomen: Obese, bowel sounds appreciated Extremities: Chronic venous changes but no edema Neuro: Awake alert and oriented x 3, nonfocal exam GU: Fair output  I reviewed last 24 h vitals and pain scores, last 48 h intake and output, last 24 h labs and trends, and last 24 h imaging results. VBG showing hypercapnic respiratory failure with PCO2 of 78 pH of 7.34 BUN of 45, creatinine  1.49  Resolved problem list   Assessment and Plan   Acute on chronic hypoxemic/hypercapnic respiratory failure Tested positive for COVID Chest x-ray with vascular congestion, cardiomegaly Cannot rule out a bacterial pneumonia -Procalcitonin pending - Ordered D-dimer, ferritin, CRP, troponin - Started on empiric antibiotics and steroids-doxycycline  and ceftriaxone  - Not clear whether her positive COVID is a factor in her decompensation - As needed bronchodilators - May transition to dexamethasone if able to take orally - CT chest ordered, pending  She does appear to be more comfortable at present compared to earlier as she stated - Can have breaks of BiPAP but will need BiPAP at night as this is used at baseline - If needs a break from BiPAP can place her on nasal cannula or salter  History of HOCM - Normally on Norpace  which will be continued - Cautious diuresis - Continue beta-blockers  Underlying history of chronic obstructive pulmonary disease - Continue steroids - Continue bronchodilators - Coverage with antibiotics  Diabetes - SSI  Chronic kidney disease stage IIIa - Avoid nephrotoxic medications - Trend electrolytes  History of hyperlipidemia, anxiety/depression, - Continue home medications  His markers of inflammation are significantly high and CRP is trending higher with increased FiO2 - May consider Actemra  Last BiPAP gathered from records was 22/14 with a rate of 8-from an entry on 07/15/2019  Jennet Epley, MD [facility] [facility] Pager: See Tracey

## 2023-08-26 NOTE — ED Notes (Signed)
 VBG results given to dr Simon.  Orders for BiPAP and neb treatment.

## 2023-08-27 ENCOUNTER — Inpatient Hospital Stay (HOSPITAL_COMMUNITY)

## 2023-08-27 DIAGNOSIS — U071 COVID-19: Secondary | ICD-10-CM | POA: Diagnosis not present

## 2023-08-27 DIAGNOSIS — J9622 Acute and chronic respiratory failure with hypercapnia: Secondary | ICD-10-CM | POA: Diagnosis not present

## 2023-08-27 DIAGNOSIS — J9621 Acute and chronic respiratory failure with hypoxia: Secondary | ICD-10-CM | POA: Diagnosis not present

## 2023-08-27 DIAGNOSIS — R0603 Acute respiratory distress: Secondary | ICD-10-CM

## 2023-08-27 LAB — ECHOCARDIOGRAM COMPLETE
AV Mean grad: 13 mmHg
AV Peak grad: 25 mmHg
Ao pk vel: 2.5 m/s
Area-P 1/2: 2.74 cm2
Height: 63 in
S' Lateral: 2.8 cm
Weight: 4455.06 [oz_av]

## 2023-08-27 LAB — COMPREHENSIVE METABOLIC PANEL WITH GFR
ALT: 29 U/L (ref 0–44)
AST: 29 U/L (ref 15–41)
Albumin: 3.2 g/dL — ABNORMAL LOW (ref 3.5–5.0)
Alkaline Phosphatase: 48 U/L (ref 38–126)
Anion gap: 12 (ref 5–15)
BUN: 54 mg/dL — ABNORMAL HIGH (ref 8–23)
CO2: 35 mmol/L — ABNORMAL HIGH (ref 22–32)
Calcium: 8.5 mg/dL — ABNORMAL LOW (ref 8.9–10.3)
Chloride: 97 mmol/L — ABNORMAL LOW (ref 98–111)
Creatinine, Ser: 1.48 mg/dL — ABNORMAL HIGH (ref 0.44–1.00)
GFR, Estimated: 37 mL/min — ABNORMAL LOW (ref >60)
Glucose, Bld: 186 mg/dL — ABNORMAL HIGH (ref 70–99)
Potassium: 3.7 mmol/L (ref 3.5–5.1)
Sodium: 144 mmol/L (ref 135–145)
Total Bilirubin: 0.8 mg/dL (ref 0.0–1.2)
Total Protein: 6.3 g/dL — ABNORMAL LOW (ref 6.5–8.1)

## 2023-08-27 LAB — CBC
HCT: 34.2 % — ABNORMAL LOW (ref 36.0–46.0)
Hemoglobin: 9.5 g/dL — ABNORMAL LOW (ref 12.0–15.0)
MCH: 26.3 pg (ref 26.0–34.0)
MCHC: 27.8 g/dL — ABNORMAL LOW (ref 30.0–36.0)
MCV: 94.7 fL (ref 80.0–100.0)
Platelets: 100 K/uL — ABNORMAL LOW (ref 150–400)
RBC: 3.61 MIL/uL — ABNORMAL LOW (ref 3.87–5.11)
RDW: 17.4 % — ABNORMAL HIGH (ref 11.5–15.5)
WBC: 5.8 K/uL (ref 4.0–10.5)
nRBC: 0 % (ref 0.0–0.2)

## 2023-08-27 LAB — GLUCOSE, CAPILLARY
Glucose-Capillary: 163 mg/dL — ABNORMAL HIGH (ref 70–99)
Glucose-Capillary: 177 mg/dL — ABNORMAL HIGH (ref 70–99)
Glucose-Capillary: 228 mg/dL — ABNORMAL HIGH (ref 70–99)
Glucose-Capillary: 228 mg/dL — ABNORMAL HIGH (ref 70–99)
Glucose-Capillary: 192 mg/dL — ABNORMAL HIGH (ref 70–99)

## 2023-08-27 MED ORDER — METHYLPREDNISOLONE SODIUM SUCC 40 MG IJ SOLR
40.0000 mg | Freq: Every day | INTRAMUSCULAR | Status: DC
  Administered 2023-08-28 – 2023-08-29 (×2): 40 mg via INTRAVENOUS
  Filled 2023-08-27 (×3): qty 1

## 2023-08-27 MED ORDER — PERFLUTREN LIPID MICROSPHERE
1.0000 mL | INTRAVENOUS | Status: AC | PRN
  Administered 2023-08-27: 5 mL via INTRAVENOUS

## 2023-08-27 MED ORDER — GUAIFENESIN-DM 100-10 MG/5ML PO SYRP
5.0000 mL | ORAL_SOLUTION | ORAL | Status: DC | PRN
Start: 2023-08-27 — End: 2023-08-29
  Administered 2023-08-27: 5 mL via ORAL
  Filled 2023-08-27: qty 10

## 2023-08-27 MED ORDER — LORAZEPAM 0.5 MG PO TABS
0.5000 mg | ORAL_TABLET | Freq: Four times a day (QID) | ORAL | Status: DC | PRN
  Administered 2023-08-27: 0.5 mg via ORAL
  Filled 2023-08-27: qty 1

## 2023-08-27 NOTE — Plan of Care (Signed)
 Will discuss with patient plan of care for the evening, pain management and medications with some teach back.   Previous nurse just placed on bed alarm for the night in a COVID room  Problem: Education: Goal: Knowledge of risk factors and measures for prevention of condition will improve Outcome: Not Progressing   Problem: Respiratory: Goal: Will maintain a patent airway Outcome: Not Progressing

## 2023-08-27 NOTE — Evaluation (Signed)
Physical Therapy Evaluation Patient Details Name: Morgan Fischer MRN: [id number] DOB: [birth year] Today's Date: 08/27/2023  History of Present Illness  Pt is 73 yo female admitted on 08/26/23 with acute on chronic resp failure.  Pt tested + for COVID with underlying hx of COPD.  Pt with other hx including but not limited to CHF,  hypertrophic cardiomyopathy, chronic kidney disease stage IIIa, type 2 diabetes asthma/COPD, morbid obesity  Clinical Impression  Pt admitted with above diagnosis. At baseline, pt is limited ambulator in home with rollator.  She lives with dtr who works but also has PCA 4 days a week.  Today, pt lethargic at arrival but once aroused alert, oriented, and agreeable to therapy.  She required light min A for transfers and was able to walk 5' to chair with minimal dyspnea.  Pt was on 4 L O2. Pt expected to progress well and likely able to return home with family and would recommend HHPT.  Pt currently with functional limitations due to the deficits listed below (see PT Problem List). Pt will benefit from acute skilled PT to increase their independence and safety with mobility to allow discharge.           If plan is discharge home, recommend the following: A little help with walking and/or transfers;A little help with bathing/dressing/bathroom;Assistance with cooking/housework;Help with stairs or ramp for entrance   Can travel by private vehicle        Equipment Recommendations None recommended by PT  Recommendations for Other Services       Functional Status Assessment Patient has had a recent decline in their functional status and demonstrates the ability to make significant improvements in function in a reasonable and predictable amount of time.     Precautions / Restrictions Precautions Precautions: Fall      Mobility  Bed Mobility Overal bed mobility: Needs Assistance Bed Mobility: Supine to Sit     Supine to sit: Min assist, Used rails           Transfers Overall transfer level: Needs assistance Equipment used: Rolling walker (2 wheels) Transfers: Sit to/from Stand Sit to Stand: Min assist           General transfer comment: Light min A to stabilize; good hand placement and use of momentum to stand    Ambulation/Gait Ambulation/Gait assistance: Editor, commissioning (Feet): 5 Feet Assistive device: Rolling walker (2 wheels) Gait Pattern/deviations: Step-to pattern, Decreased stride length Gait velocity: decreased     General Gait Details: light min A to stabilize during a  few steps to the chair  Stairs            Wheelchair Mobility     Tilt Bed    Modified Rankin (Stroke Patients Only)       Balance Overall balance assessment: Needs assistance Sitting-balance support: No upper extremity supported Sitting balance-Leahy Scale: Good     Standing balance support: Bilateral upper extremity supported, Reliant on assistive device for balance Standing balance-Leahy Scale: Poor Standing balance comment: RW and CGA to light min A                             Pertinent Vitals/Pain Pain Assessment Pain Assessment: No/denies pain    Home Living Family/patient expects to be discharged to:: Private residence Living Arrangements: Children Available Help at Discharge: Family;Available PRN/intermittently;Personal care attendant (PCA M-Thur 9-1) Type of Home: House Home Access: Ramped entrance  Home Layout: One level Home Equipment: Lift chair;Shower seat;Toilet riser;Rollator (4 wheels);Wheelchair - manual      Prior Function Prior Level of Function : Needs assist             Mobility Comments: Limited household ambulator; uses rollator ADLs Comments: Has assist with IADLs and lower body ADLs     Extremity/Trunk Assessment   Upper Extremity Assessment Upper Extremity Assessment: Defer to OT evaluation    Lower Extremity Assessment Lower Extremity Assessment: LLE  deficits/detail;RLE deficits/detail RLE Deficits / Details: ROM WFL (some limitation due to body habitus but functional); MMT grossly 4/5 LLE Deficits / Details: ROM WFL (some limitation due to body habitus but functional); MMT grossly 4/5    Cervical / Trunk Assessment Cervical / Trunk Assessment: Other exceptions Cervical / Trunk Exceptions: increased body habitus  Communication        Cognition Arousal: Alert Behavior During Therapy: WFL for tasks assessed/performed   PT - Cognitive impairments: No apparent impairments                       PT - Cognition Comments: Pt asleep and difficult to arouse but once awoken was alert and oriented         Cueing       General Comments General comments (skin integrity, edema, etc.): Pt on 4 L O2 ; poor pleth during transfer reading 79% but up to 90% once sitting and good pleth; pt with min dyspnea recovering quickly    Exercises     Assessment/Plan    PT Assessment Patient needs continued PT services  PT Problem List Decreased strength;Decreased mobility;Decreased activity tolerance;Cardiopulmonary status limiting activity;Decreased balance;Decreased knowledge of use of DME       PT Treatment Interventions DME instruction;Therapeutic exercise;Gait training;Functional mobility training;Therapeutic activities;Patient/family education;Balance training    PT Goals (Current goals can be found in the Care Plan section)  Acute Rehab PT Goals Patient Stated Goal: return home PT Goal Formulation: With patient Time For Goal Achievement: 09/06/2023 Potential to Achieve Goals: Good    Frequency Min 2X/week     Co-evaluation PT/OT/SLP Co-Evaluation/Treatment: Yes Reason for Co-Treatment: For patient/therapist safety;Complexity of the patient's impairments (multi-system involvement) PT goals addressed during session: Mobility/safety with mobility         AM-PAC PT 6 Clicks Mobility  Outcome Measure Help needed turning  from your back to your side while in a flat bed without using bedrails?: A Little Help needed moving from lying on your back to sitting on the side of a flat bed without using bedrails?: A Little Help needed moving to and from a bed to a chair (including a wheelchair)?: A Little Help needed standing up from a chair using your arms (e.g., wheelchair or bedside chair)?: A Little Help needed to walk in hospital room?: A Little Help needed climbing 3-5 steps with a railing? : A Lot 6 Click Score: 17    End of Session Equipment Utilized During Treatment: Gait belt;Oxygen  Activity Tolerance: Patient tolerated treatment well Patient left: in chair;with chair alarm set;with call bell/phone within reach Nurse Communication: Mobility status PT Visit Diagnosis: Other abnormalities of gait and mobility (R26.89);Muscle weakness (generalized) (M62.81)    Time: 8853-8787 PT Time Calculation (min) (ACUTE ONLY): 26 min   Charges:   PT Evaluation $PT Eval Low Complexity: 1 Low   PT General Charges $$ ACUTE PT VISIT: 1 Visit         Matylda Fehring, PT Acute Rehab Services [place]  [facility] [phone]   Benjiman VEAR Mulberry 08/27/2023, 12:25 PM

## 2023-08-27 NOTE — Plan of Care (Signed)
  Problem: Respiratory: Goal: Will maintain a patent airway Outcome: Progressing   Problem: Coping: Goal: Level of anxiety will decrease Outcome: Progressing   Problem: Safety: Goal: Ability to remain free from injury will improve Outcome: Progressing

## 2023-08-27 NOTE — Evaluation (Signed)
Occupational Therapy Evaluation Patient Details Name: Morgan Fischer MRN: [id number] DOB: [birth year] Today's Date: 08/27/2023   History of Present Illness   Pt is 73 yo female admitted on 08/26/23 with acute on chronic resp failure.  Pt tested + for COVID with underlying hx of COPD.  Pt with other hx including but not limited to CHF,  hypertrophic cardiomyopathy, chronic kidney disease stage IIIa, type 2 diabetes asthma/COPD, morbid obesity     Clinical Impressions Prior to this admission, patient has her daughter who lives with her, but is not available 24/7 and has a PCA who comes M-Tr 9-1. Patient uses a rollator to ambulate and uses a lift chair primarily. She endorses needing assistance with lower body ADLs, which PCA and daughter assist. Currently, patient on 4L O2 (typcially on 2-3L baseline) and presenting with minimal decreased activity tolerance. Patient min A for bed mobility, transfers, and ADL management. OT recommending HHOT at discharge, and OT will continue to follow acutely.      If plan is discharge home, recommend the following:   A little help with walking and/or transfers;A little help with bathing/dressing/bathroom;Assist for transportation     Functional Status Assessment   Patient has had a recent decline in their functional status and demonstrates the ability to make significant improvements in function in a reasonable and predictable amount of time.     Equipment Recommendations   None recommended by OT     Recommendations for Other Services         Precautions/Restrictions   Precautions Precautions: Fall     Mobility Bed Mobility Overal bed mobility: Needs Assistance Bed Mobility: Supine to Sit     Supine to sit: Min assist, Used rails          Transfers Overall transfer level: Needs assistance Equipment used: Rolling walker (2 wheels) Transfers: Sit to/from Stand Sit to Stand: Min assist           General transfer comment:  Light min A to stabilize; good hand placement and use of momentum to stand      Balance Overall balance assessment: Needs assistance Sitting-balance support: No upper extremity supported Sitting balance-Leahy Scale: Good     Standing balance support: Bilateral upper extremity supported, Reliant on assistive device for balance Standing balance-Leahy Scale: Poor Standing balance comment: RW and CGA to light min A                           ADL either performed or assessed with clinical judgement   ADL Overall ADL's : Needs assistance/impaired Eating/Feeding: Set up;Sitting   Grooming: Set up;Sitting   Upper Body Bathing: Contact guard assist;Sitting   Lower Body Bathing: Moderate assistance;Sit to/from stand;Sitting/lateral leans;Maximal assistance   Upper Body Dressing : Contact guard assist;Sitting   Lower Body Dressing: Moderate assistance;Maximal assistance;Sit to/from stand;Sitting/lateral leans   Toilet Transfer: Minimal assistance;Ambulation;Rolling walker (2 wheels)   Toileting- Clothing Manipulation and Hygiene: Moderate assistance;Sit to/from stand;Sitting/lateral lean Toileting - Clothing Manipulation Details (indicate cue type and reason): for thoroughness     Functional mobility during ADLs: Minimal assistance;Rolling walker (2 wheels) General ADL Comments: Prior to this admission, patient has her daughter who lives with her, but is not available 24/7 and has a PCA who comes M-Tr 9-1. Patient uses a rollator to ambulate and uses a lift chair primarily. She endorses needing assistance with lower body ADLs, which PCA and daughter assist. Currently, patient on 4L O2 (typcially on 2-3L  baseline) and presenting with minimal decreased activity tolerance. Patient min A for bed mobility, transfers, and ADL management. OT recommending HHOT at discharge, and OT will continue to follow acutely.     Vision Baseline Vision/History: 0 No visual deficits Ability to See  in Adequate Light: 0 Adequate Patient Visual Report: No change from baseline Vision Assessment?: No apparent visual deficits     Perception Perception: Not tested       Praxis Praxis: Not tested       Pertinent Vitals/Pain Pain Assessment Pain Assessment: No/denies pain     Extremity/Trunk Assessment Upper Extremity Assessment Upper Extremity Assessment: Right hand dominant;Overall [facility] for tasks assessed   Lower Extremity Assessment Lower Extremity Assessment: Defer to PT evaluation RLE Deficits / Details: ROM WFL (some limitation due to body habitus but functional); MMT grossly 4/5 LLE Deficits / Details: ROM WFL (some limitation due to body habitus but functional); MMT grossly 4/5   Cervical / Trunk Assessment Cervical / Trunk Assessment: Other exceptions Cervical / Trunk Exceptions: increased body habitus   Communication Communication Communication: No apparent difficulties   Cognition Arousal: Alert Behavior During Therapy: WFL for tasks assessed/performed Cognition: No apparent impairments                               Following commands: Intact       Cueing  General Comments   Cueing Techniques: Verbal cues  Pt on 4 L O2 ; poor pleth during transfer reading 79% but up to 90% once sitting and good pleth; pt with min dyspnea recovering quickly   Exercises     Shoulder Instructions      Home Living Family/patient expects to be discharged to:: Private residence Living Arrangements: Children Available Help at Discharge: Family;Available PRN/intermittently;Personal care attendant (PCA M-Thur 9-1) Type of Home: House Home Access: Ramped entrance     Home Layout: One level     Bathroom Shower/Tub: Producer, television/film/video: Standard Bathroom Accessibility: Yes How Accessible: Accessible via walker Home Equipment: Lift chair;Shower seat;Toilet riser;Rollator (4 wheels);Wheelchair - manual          Prior Functioning/Environment  Prior Level of Function : Needs assist             Mobility Comments: Limited household ambulator; uses rollator ADLs Comments: Has assist with IADLs and lower body ADLs    OT Problem List: Decreased activity tolerance;Cardiopulmonary status limiting activity;Obesity   OT Treatment/Interventions: Self-care/ADL training;Therapeutic exercise;Energy conservation;DME and/or AE instruction;Manual therapy;Patient/family education;Balance training;Therapeutic activities      OT Goals(Current goals can be found in the care plan section)   Acute Rehab OT Goals Patient Stated Goal: to get better OT Goal Formulation: With patient Time For Goal Achievement: 09/26/2023 Potential to Achieve Goals: Good ADL Goals Pt Will Perform Lower Body Bathing: with caregiver independent in assisting;sitting/lateral leans;sit to/from stand;with mod assist Pt Will Perform Lower Body Dressing: with mod assist;sit to/from stand;sitting/lateral leans;with caregiver independent in assisting Pt Will Transfer to Toilet: with modified independence;ambulating;regular height toilet Pt Will Perform Toileting - Clothing Manipulation and hygiene: with modified independence;sitting/lateral leans;sit to/from stand Pt/caregiver will Perform Home Exercise Program: Increased strength;Both right and left upper extremity;With written HEP provided;With theraband;Independently Additional ADL Goal #1: Patient will be able to complete functional task in standing for 2 minutes prior to needing seated rest break in order to improve overall activity tolerance.   OT Frequency:  Min 2X/week    Co-evaluation  Reason for Co-Treatment: For patient/therapist safety;Complexity of the patient's impairments (multi-system involvement) PT goals addressed during session: Mobility/safety with mobility        AM-PAC OT 6 Clicks Daily Activity     Outcome Measure Help from another person eating meals?: A Little Help from another person  taking care of personal grooming?: A Little Help from another person toileting, which includes using toliet, bedpan, or urinal?: A Lot Help from another person bathing (including washing, rinsing, drying)?: A Lot Help from another person to put on and taking off regular upper body clothing?: A Little Help from another person to put on and taking off regular lower body clothing?: A Lot 6 Click Score: 15   End of Session Equipment Utilized During Treatment: Gait belt;Rolling walker (2 wheels);Oxygen  Nurse Communication: Mobility status  Activity Tolerance: Patient tolerated treatment well Patient left: in chair;with call bell/phone within reach;with chair alarm set  OT Visit Diagnosis: Unsteadiness on feet (R26.81);Muscle weakness (generalized) (M62.81)                Time: 8853-8787 OT Time Calculation (min): 26 min Charges:  OT General Charges $OT Visit: 1 Visit OT Evaluation $OT Eval Moderate Complexity: 1 Mod  Ronal Gift E. Jericka Kadar, OTR/L Acute Rehabilitation Services [phone]   Ronal Gift Salt 08/27/2023, 1:08 PM

## 2023-08-27 NOTE — Progress Notes (Addendum)
 PROGRESS NOTE    Morgan Fischer:[id number] DOB: 26-Dec-1950 DOA: 08/26/2023 PCP: Morgan Cyndee [name] [name], DO    Brief Narrative:  Patient with known history of chronic hypoxemic and hypercapnic respiratory failure on daytime oxygen  2 L and nighttime BiPAP, chronic diastolic congestive heart failure admitted with about 5 days of shortness of breath, cough.  At the emergency room initially on 5 L oxygen  subsequently placed on BiPAP.  VBG with pH 7.28, PCO258.  WBC normal.  Creatinine 1.5.  proBNP 22,000.  Chest x-ray with cardiomegaly and pulmonary vascular congestion.  COVID-19 positive.  Subjective: Patient was seen and examined.  She tells me that she could not sleep well however breathing was better.  Today, she is able to talk in complete sentences through the BiPAP.  Afebrile overnight.  Urine output not well-documented, 400 cc since admission.   Assessment & Plan:   Acute on chronic hypoxemic and hypercapnic respiratory failure likely secondary to COVID-19 infection causing exacerbation of diastolic heart failure. Keep on oxygen  to keep saturation more than 90%.  Keep on BiPAP for respiratory distress and also at night. Uses 2 L oxygen  at home and BiPAP at night at home. Does have history of hypertrophic cardiomyopathy, EF more than 55%. Low-dose IV Lasix , intake output monitoring.  Cardiology following.  Continue Norpace , Toprol  XL.  Further recommendation as per cardiology.  Repeat echocardiogram pending.  COPD with acute exacerbation secondary to COVID-19 infection: Aggressive bronchodilator therapy, IV steroids-will gradually taper off, inhalational steroids, scheduled and as needed bronchodilators, deep breathing exercises, incentive spirometry, chest physiotherapy.  Physical and Occupational Therapy to mobilize. Antibiotics due to severity of symptoms.  Doxycycline  and Rocephin  for 7 days. Supplemental oxygen  to keep saturations more than 90%.  BiPAP as above. CT scan of the  chest, pending. Blood cultures were not drawn. Procalcitonin 0.9.  Type 2 diabetes: On insulin .  Aggravated with use of steroids.  Will taper off steroids.  Hyperlipidemia: On statin.  CKD stage IIIa: At about baseline.  Close monitoring on high-dose steroids.  Start mobilizing.  PT OT.  Taper high-dose steroids.  Stay at a stepdown unit today because of need for BiPAP around-the-clock.    DVT prophylaxis: enoxaparin  (LOVENOX ) injection 40 mg Start: 08/26/23 2200 SCDs Start: 08/26/23 1758   Code Status: Full code Family Communication: None at the bedside Disposition Plan: Status is: Inpatient Remains inpatient appropriate because: Significant shortness of breath, BiPAP need     Consultants:  Pulmonary Cardiology  Procedures:  None  Antimicrobials:  Rocephin  and doxycycline  8/24--     Objective: Vitals:   08/27/23 0356 08/27/23 0400 08/27/23 0500 08/27/23 0600  BP:  (!) 115/41 (!) 123/44 (!) 142/53  Pulse:  60 (!) 59 (!) 59  Resp:  17 19 (!) 21  Temp: 97.7 F (36.5 C)     TempSrc: Axillary     SpO2:  100% 100% 99%  Weight:   126.3 kg   Height:        Intake/Output Summary (Last 24 hours) at 08/27/2023 0704 Last data filed at 08/27/2023 9391 Gross per 24 hour  Intake 100 ml  Output 400 ml  Net -300 ml   Filed Weights   08/26/23 1201 08/26/23 1700 08/27/23 0500  Weight: 134.6 kg 127.4 kg 126.3 kg    Examination:  General exam: Appears calm and comfortable.  Able to talk on the BiPAP. Respiratory system: Clear to auscultation. Respiratory effort normal.  No added sounds.  Not in any distress. Cardiovascular system: S1 &  S2 heard, RRR. No JVD, murmurs, rubs, gallops or clicks.  Trace edema.  Chronic skin changes. Gastrointestinal system: Abdomen is nondistended, soft and nontender. No organomegaly or masses felt. Normal bowel sounds heard.  Obese pendulous. Central nervous system: Alert and oriented. No focal neurological deficits. Extremities:  Symmetric 5 x 5 power. Skin: No rashes, lesions or ulcers Psychiatry: Judgement and insight appear normal. Mood & affect appropriate.     Data Reviewed: I have personally reviewed following labs and imaging studies  CBC: Recent Labs  Lab 08/26/23 1216 08/26/23 1217 08/26/23 1401 08/26/23 1822 08/27/23 0221  WBC  --  8.5  --  6.6 5.8  NEUTROABS  --  6.7  --   --   --   HGB 11.6* 10.3* 10.2* 10.0* 9.5*  HCT 34.0* 35.2* 30.0* 36.4 34.2*  MCV  --  91.2  --  93.6 94.7  PLT  --  104*  --  97* 100*   Basic Metabolic Panel: Recent Labs  Lab 08/26/23 1216 08/26/23 1217 08/26/23 1401 08/26/23 1822 08/27/23 0221  NA 142 146* 143  --  144  K 3.2* 3.5 3.5  --  3.7  CL  --  97*  --   --  97*  CO2  --  35*  --   --  35*  GLUCOSE  --  183*  --   --  186*  BUN  --  45*  --   --  54*  CREATININE  --  1.50*  --  1.49* 1.48*  CALCIUM   --  9.5  --   --  8.5*   GFR: Estimated Creatinine Clearance: 43.2 mL/min (A) (by C-G formula based on SCr of 1.48 mg/dL (H)). Liver Function Tests: Recent Labs  Lab 08/26/23 1217 08/27/23 0221  AST 48* 29  ALT 33 29  ALKPHOS 57 48  BILITOT 0.7 0.8  PROT 6.8 6.3*  ALBUMIN 4.1 3.2*   No results for input(s): LIPASE, AMYLASE in the last 168 hours. No results for input(s): AMMONIA in the last 168 hours. Coagulation Profile: No results for input(s): INR, PROTIME in the last 168 hours. Cardiac Enzymes: No results for input(s): CKTOTAL, CKMB, CKMBINDEX, TROPONINI in the last 168 hours. BNP (last 3 results) Recent Labs    08/16/23 1059 08/26/23 1217  PROBNP 1,497.0* 22,304.0*   HbA1C: No results for input(s): HGBA1C in the last 72 hours. CBG: Recent Labs  Lab 08/26/23 2201  GLUCAP 182*   Lipid Profile: No results for input(s): CHOL, HDL, LDLCALC, TRIG, CHOLHDL, LDLDIRECT in the last 72 hours. Thyroid  Function Tests: No results for input(s): TSH, T4TOTAL, FREET4, T3FREE, THYROIDAB in the last 72  hours. Anemia Panel: Recent Labs    08/26/23 1910  FERRITIN 122   Sepsis Labs: Recent Labs  Lab 08/26/23 1822  PROCALCITON 0.92    Recent Results (from the past 240 hours)  Resp panel by RT-PCR (RSV, Flu A&B, Covid) Anterior Nasal Swab     Status: Abnormal   Collection Time: 08/26/23 12:17 PM   Specimen: Anterior Nasal Swab  Result Value Ref Range Status   SARS Coronavirus 2 by RT PCR POSITIVE (A) NEGATIVE Final    Comment: (NOTE) SARS-CoV-2 target nucleic acids are DETECTED.  The SARS-CoV-2 RNA is generally detectable in upper respiratory specimens during the acute phase of infection. Positive results are indicative of the presence of the identified virus, but do not rule out bacterial infection or co-infection with other pathogens not detected by the test. Clinical correlation  with patient history and other diagnostic information is necessary to determine patient infection status. The expected result is Negative.  Fact Sheet for Patients: BloggerCourse.com  Fact Sheet for Healthcare Providers: SeriousBroker.it  This test is not yet approved or cleared by the [place]  FDA and  has been authorized for detection and/or diagnosis of SARS-CoV-2 by FDA under an Emergency Use Authorization (EUA).  This EUA will remain in effect (meaning this test can be used) for the duration of  the COVID-19 declaration under Section 564(b)(1) of the A ct, 21 U.S.C. section 360bbb-3(b)(1), unless the authorization is terminated or revoked sooner.     Influenza A by PCR NEGATIVE NEGATIVE Final   Influenza B by PCR NEGATIVE NEGATIVE Final    Comment: (NOTE) The Xpert Xpress SARS-CoV-2/FLU/RSV plus assay is intended as an aid in the diagnosis of influenza from Nasopharyngeal swab specimens and should not be used as a sole basis for treatment. Nasal washings and aspirates are unacceptable for Xpert Xpress  SARS-CoV-2/FLU/RSV testing.  Fact Sheet for Patients: BloggerCourse.com  Fact Sheet for Healthcare Providers: SeriousBroker.it  This test is not yet approved or cleared by the [place]  FDA and has been authorized for detection and/or diagnosis of SARS-CoV-2 by FDA under an Emergency Use Authorization (EUA). This EUA will remain in effect (meaning this test can be used) for the duration of the COVID-19 declaration under Section 564(b)(1) of the Act, 21 U.S.C. section 360bbb-3(b)(1), unless the authorization is terminated or revoked.     Resp Syncytial Virus by PCR NEGATIVE NEGATIVE Final    Comment: (NOTE) Fact Sheet for Patients: BloggerCourse.com  Fact Sheet for Healthcare Providers: SeriousBroker.it  This test is not yet approved or cleared by the [place]  FDA and has been authorized for detection and/or diagnosis of SARS-CoV-2 by FDA under an Emergency Use Authorization (EUA). This EUA will remain in effect (meaning this test can be used) for the duration of the COVID-19 declaration under Section 564(b)(1) of the Act, 21 U.S.C. section 360bbb-3(b)(1), unless the authorization is terminated or revoked.  Performed at [facility], [address] [place], [place] [address]   MRSA Next Gen by PCR, Nasal     Status: None   Collection Time: 08/26/23  5:06 PM   Specimen: Nasal Mucosa; Nasal Swab  Result Value Ref Range Status   MRSA by PCR Next Gen NOT DETECTED NOT DETECTED Final    Comment: (NOTE) The GeneXpert MRSA Assay (FDA approved for NASAL specimens only), is one component of a comprehensive MRSA colonization surveillance program. It is not intended to diagnose MRSA infection nor to guide or monitor treatment for MRSA infections. Test performance is not FDA approved in patients less than 71 years old. Performed at [facility],  [address], [place], [place] [address]          Radiology Studies: [facility] Chest Port 1 View Result Date: 08/26/2023 CLINICAL DATA:  Shortness of breath. EXAM: PORTABLE CHEST 1 VIEW COMPARISON:  08/16/2023. FINDINGS: Patient is rotated to the left. Unchanged marked enlargement of the cardiopericardial silhouette with pulmonary vascular congestion and possible interstitial edema. No large pleural effusion. No pneumothorax. No acute osseous abnormality. IMPRESSION: Marked cardiomegaly with pulmonary vascular congestion and suspected interstitial edema, similar to the prior exam. Electronically Signed   By: Harrietta Sherry M.D.   On: 08/26/2023 12:57        Scheduled Meds:  acidophilus  1 capsule Oral Q1500   aspirin  EC  81 mg  Oral Daily   Chlorhexidine  Gluconate Cloth  6 each Topical Daily   disopyramide   100 mg Oral TID   doxycycline   100 mg Oral Q12H   enoxaparin  (LOVENOX ) injection  40 mg Subcutaneous Q24H   furosemide   20 mg Intravenous BID   gabapentin   200 mg Oral Daily   insulin  aspart  0-15 Units Subcutaneous TID WC   insulin  glargine-yfgn  5 Units Subcutaneous QHS   ipratropium-albuterol   3 mL Inhalation Q6H   linagliptin   5 mg Oral Daily   methylPREDNISolone  (SOLU-MEDROL ) injection  40 mg Intravenous Q8H   metoprolol  succinate  50 mg Oral Daily   montelukast   10 mg Oral QHS   multivitamin with minerals  1 tablet Oral Daily   mouth rinse  15 mL Mouth Rinse 4 times per day   rosuvastatin   10 mg Oral Daily   senna  1 tablet Oral BID   sertraline   150 mg Oral Daily   Continuous Infusions:  cefTRIAXone  (ROCEPHIN )  IV Stopped (08/26/23 2235)     LOS: 1 day    Time spent: 55 minutes    Renato Applebaum, MD [facility]

## 2023-08-27 NOTE — Consult Note (Signed)
 Ref: Antonio Cyndee [name] [name], DO   Subjective:  Echocardiogram remains unchanged as HOCM. CT chest is suggestive of possible pneumonia.  Objective:  Vital Signs in the last 24 hours: Temp:  [97.6 F (36.4 C)-98.4 F (36.9 C)] 98.4 F (36.9 C) (08/25 2302) Pulse Rate:  [57-70] 58 (08/25 2302) Cardiac Rhythm: Sinus bradycardia (08/25 2000) Resp:  [13-27] 22 (08/25 2302) BP: (111-143)/(41-95) 125/49 (08/25 2200) SpO2:  [95 %-100 %] 100 % (08/25 2302) FiO2 (%):  [40 %] 40 % (08/25 2302) Weight:  [126.3 kg] 126.3 kg (08/25 0500)  Physical Exam: BP Readings from Last 1 Encounters:  08/27/23 (!) 125/49     Wt Readings from Last 1 Encounters:  08/27/23 126.3 kg    Weight change:  Body mass index is 49.32 kg/m.   Intake/Output from previous day: 08/24 0701 - 08/25 0700 In: 100 [IV Piggyback:100] Out: 400 [Urine:400]    Lab Results: BMET    Component Value Date/Time   NA 144 08/27/2023 0221   NA 143 08/26/2023 1401   NA 146 (H) 08/26/2023 1217   NA 143 10/11/2022 1002   NA 145 (H) 09/28/2022 1053   NA 144 02/10/2021 1056   K 3.7 08/27/2023 0221   K 3.5 08/26/2023 1401   K 3.5 08/26/2023 1217   CL 97 (L) 08/27/2023 0221   CL 97 (L) 08/26/2023 1217   CL 97 08/16/2023 1059   CO2 35 (H) 08/27/2023 0221   CO2 35 (H) 08/26/2023 1217   CO2 41 (H) 08/16/2023 1059   GLUCOSE 186 (H) 08/27/2023 0221   GLUCOSE 183 (H) 08/26/2023 1217   GLUCOSE 114 (H) 08/16/2023 1059   BUN 54 (H) 08/27/2023 0221   BUN 45 (H) 08/26/2023 1217   BUN 45 (H) 08/16/2023 1059   BUN 50 (H) 10/11/2022 1002   BUN 63 (H) 09/28/2022 1053   BUN 25 02/10/2021 1056   CREATININE 1.48 (H) 08/27/2023 0221   CREATININE 1.49 (H) 08/26/2023 1822   CREATININE 1.50 (H) 08/26/2023 1217   CALCIUM  8.5 (L) 08/27/2023 0221   CALCIUM  9.5 08/26/2023 1217   CALCIUM  8.9 08/16/2023 1059   GFRNONAA 37 (L) 08/27/2023 0221   GFRNONAA 37 (L) 08/26/2023 1822   GFRNONAA 36 (L) 08/26/2023 1217   GFRAA 71 11/10/2019 1040    GFRAA 61 07/30/2019 1056   GFRAA >60 07/20/2019 0612   CBC    Component Value Date/Time   WBC 5.8 08/27/2023 0221   RBC 3.61 (L) 08/27/2023 0221   HGB 9.5 (L) 08/27/2023 0221   HCT 34.2 (L) 08/27/2023 0221   PLT 100 (L) 08/27/2023 0221   MCV 94.7 08/27/2023 0221   MCH 26.3 08/27/2023 0221   MCHC 27.8 (L) 08/27/2023 0221   RDW 17.4 (H) 08/27/2023 0221   LYMPHSABS 0.8 08/26/2023 1217   MONOABS 0.9 08/26/2023 1217   EOSABS 0.0 08/26/2023 1217   BASOSABS 0.0 08/26/2023 1217   HEPATIC Function Panel Recent Labs    08/16/23 1059 08/26/23 1217 08/27/23 0221  PROT 6.1 6.8 6.3*  ALBUMIN 4.0 4.1 3.2*  AST 16 48* 29  ALT 14 33 29  ALKPHOS 54 57 48   HEMOGLOBIN A1C Lab Results  Component Value Date   MPG 208.73 03/08/2020   CARDIAC ENZYMES Lab Results  Component Value Date   CKTOTAL 46 03/08/2020   TROPONINI <0.03 08/24/2015   TROPONINI 0.03 (HH) 08/24/2015   TROPONINI 0.03 (HH) 08/24/2015   BNP Recent Labs    08/16/23 1059 08/26/23 1217  PROBNP  1,497.0* 22,304.0*   TSH No results for input(s): TSH in the last 8760 hours. CHOLESTEROL Recent Labs    11/23/22 1056 02/27/23 1049 08/16/23 1059  CHOL 148 153 144    Scheduled Meds:  acidophilus  1 capsule Oral Q1500   aspirin  EC  81 mg Oral Daily   Chlorhexidine  Gluconate Cloth  6 each Topical Daily   disopyramide   100 mg Oral TID   doxycycline   100 mg Oral Q12H   enoxaparin  (LOVENOX ) injection  40 mg Subcutaneous Q24H   furosemide   20 mg Intravenous BID   gabapentin   200 mg Oral Daily   insulin  aspart  0-15 Units Subcutaneous TID WC   insulin  glargine-yfgn  5 Units Subcutaneous QHS   ipratropium-albuterol   3 mL Inhalation Q6H   linagliptin   5 mg Oral Daily   [START ON 08/28/2023] methylPREDNISolone  (SOLU-MEDROL ) injection  40 mg Intravenous Daily   metoprolol  succinate  50 mg Oral Daily   montelukast   10 mg Oral QHS   multivitamin with minerals  1 tablet Oral Daily   mouth rinse  15 mL Mouth Rinse 4  times per day   rosuvastatin   10 mg Oral Daily   senna  1 tablet Oral BID   sertraline   150 mg Oral Daily   Continuous Infusions:  cefTRIAXone  (ROCEPHIN )  IV Stopped (08/27/23 2141)   PRN Meds:.acetaminophen  **OR** acetaminophen , albuterol , diclofenac  Sodium, guaiFENesin -dextromethorphan , hydrALAZINE , HYDROcodone -acetaminophen , LORazepam , ondansetron  **OR** ondansetron  (ZOFRAN ) IV, mouth rinse, polyethylene glycol  Assessment/Plan: Acute on chronic hypoxemic/hypercapnic respiratory failure  COVID infection Hypertrophic obstructive cardiomyopathy COPD Type 2 DM CKD IIIa HLD Anxiety/depression Morbid obesity, Stage 3  Plan: Respiratory support, oxygen  and diuresis as needed.   LOS: 1 day   Time spent including chart review, lab review, examination: 35 min. Salena Negri  MD  08/27/2023, 11:41 PM

## 2023-08-27 NOTE — Progress Notes (Signed)
 NAME:  Morgan Fischer, MRN:  [id number], DOB:  1950-04-10, LOS: 1 ADMISSION DATE:  08/26/2023, CONSULTATION DATE: 08/26/2023 REFERRING MD: Dr. Redia, CHIEF COMPLAINT: Acute on chronic hypoxemic/hypercarbic respiratory failure  History of Present Illness:  Patient with known history of chronic respiratory failure on BiPAP at night for hypercapnic respiratory failure, history of chronic diastolic congestive heart failure Transferred from med [facility] where she had presented with worsening shortness of breath of about 5 days duration She has had a cough, no sputum production, no chest pain or chest discomfort No fevers or chills No contact with anyone with a febrile illness No change in bowel habits, no dysuria  Recently followed up at [facility] last Wednesday and prior to that visit was feeling fine but she felt that there may have been people who was sick in the waiting area  Normally on 2 L of oxygen  during the day but in the last few weeks oxygen  levels were increased to 3 L because of desaturations  History of hypertrophic cardiomyopathy, chronic kidney disease stage IIIa, type 2 diabetes asthma/COPD, morbid obesity, usual activity tolerance about 20-30 steps, never smoker  Has been compliant with her usual medications  Tested positive for COVID but does not recollect being around anybody with acute febrile illness  Pertinent  Medical History   Past Medical History:  Diagnosis Date   Acute diastolic heart failure (HCC) 07/17/2019   Acute hypercapnic respiratory failure (HCC) 07/21/2016   Acute on chronic congestive heart failure (HCC) 07/19/2016   Acute on chronic diastolic CHF (congestive heart failure) (HCC) 03/08/2020   Acute on chronic respiratory failure with hypoxia (HCC) 10/20/2018   Acute on chronic respiratory failure with hypoxia and hypercapnia (HCC) 03/08/2020   Acute respiratory acidosis (HCC) 07/19/2016   Acute respiratory distress 07/19/2016   Acute respiratory  failure (HCC) 03/08/2020   Anxiety 03/15/2020   Aortic stenosis 11/01/2018   Arthritis    Asthma    Atherosclerosis of native coronary artery of native heart without angina pectoris 03/17/2016   Cardiomyopathy (HCC) 03/15/2020   Chest pain 07/14/2019   CHF (congestive heart failure) (HCC)    Chronic diastolic heart failure (HCC) 03/17/2016   Chronic hypoxemic respiratory failure (HCC)    Chronic kidney disease, stage 3b (HCC) 02/04/2021   CKD (chronic kidney disease)    COPD (chronic obstructive pulmonary disease) (HCC)    COPD with acute exacerbation (HCC) 03/08/2020   Diabetes mellitus without complication (HCC)    Drug allergy 07/21/2016   Essential (primary) hypertension 11/19/2016   Essential hypertension 11/19/2016   Gout 11/19/2016   Heart block AV complete (HCC)    High cholesterol    History of gout 04/05/2023   HOCM (hypertrophic obstructive cardiomyopathy) (HCC) 09/18/2017   Hyperlipidemia associated with type 2 diabetes mellitus (HCC) 11/23/2022   Hyperlipidemia, unspecified 03/15/2020   Hypertension    Hypertensive heart disease with heart failure (HCC) 03/17/2016   Hypertensive heart disease without congestive heart failure 03/15/2020   Hypertrophic cardiomyopathy (HCC) 04/12/2020   Hypokalemia 08/24/2015   Hypomagnesemia    Hypoxia 03/17/2016   Insomnia 11/23/2022   Iron deficiency 03/15/2020   Leukocytosis 07/19/2016   Long term (current) use of insulin  (HCC) 02/04/2021   Mild intermittent asthma 02/04/2021   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   Mixed hyperlipidemia 03/15/2020   Morbid obesity (HCC)    Morbid obesity with BMI of 60.0-69.9, adult (HCC) 07/21/2016   Need for influenza vaccination 11/23/2022   Need for pneumococcal 20-valent conjugate vaccination 11/23/2022  Nonrheumatic aortic valve stenosis 03/17/2016   Obesity hypoventilation syndrome (HCC) 03/08/2020   Obstructive sleep apnea 11/19/2016   Osteoarthritis of knee 03/15/2020   Panniculitis  08/23/2015   Peripheral venous insufficiency 03/15/2020   Pure hypercholesterolemia 02/13/2017   SOB (shortness of breath) 08/24/2015   Troponin level elevated 08/24/2015   Type 2 diabetes mellitus with hyperglycemia (HCC) 07/19/2016   Type 2 diabetes mellitus with stage 3 chronic kidney disease, with long-term current use of insulin  (HCC) 11/19/2016   Type 2 diabetes mellitus without complication, without long-term current use of insulin  (HCC) 04/12/2020   Type 2 diabetes mellitus without complications (HCC) 03/15/2020   Type 2 diabetes mellitus, without long-term current use of insulin  (HCC) 11/19/2016     Significant Hospital Events: Including procedures, antibiotic start and stop dates in addition to other pertinent events   8/24 chest x-ray with pulmonary vascular congestion 8/25 No acute issues overnight, wore BIPAP   Interim History / Subjective:  Seen sitting up with no acute complaints   Objective    Blood pressure (!) 142/53, pulse (!) 59, temperature 97.7 F (36.5 C), temperature source Axillary, resp. rate (!) 21, height 5' 3 (1.6 m), weight 126.3 kg, SpO2 99%.    FiO2 (%):  [30 %-40 %] 40 % PEEP:  [5 cmH20-14 cmH20] 14 cmH20 Pressure Support:  [5 cmH20-15 cmH20] 8 cmH20   Intake/Output Summary (Last 24 hours) at 08/27/2023 0737 Last data filed at 08/27/2023 0608 Gross per 24 hour  Intake 100 ml  Output 400 ml  Net -300 ml   Filed Weights   08/26/23 1201 08/26/23 1700 08/27/23 0500  Weight: 134.6 kg 127.4 kg 126.3 kg    Examination: General: Acute on chronic ill-appearing obese elderly female sitting up in bed in no acute distress HEENT: [place]/AT, MM pink/moist, PERRL,  Neuro: Oriented x 3, nonfocal CV: s1s2 regular rate and rhythm, no murmur, rubs, or gallops,  PULM: To auscultation bilaterally with slightly diminished, no increased work of breathing, on 4 L nasal cannula GI: soft, bowel sounds active in all 4 quadrants, non-tender, non-distended, tolerating  oral diet Extremities: warm/dry, generalized edema  Skin: no rashes or lesions  Resolved problem list   Assessment and Plan   Acute on chronic hypoxemic/hypercapnic respiratory failure Tested positive for COVID -Chest x-ray with vascular congestion, cardiomegaly. Cannot rule out a bacterial pneumonia -His markers of inflammation are significantly high and CRP is trending higher with increased FiO2 Underlying history of chronic obstructive pulmonary disease -Last BiPAP settings gathered from records was 22/14 with a rate of 8-from an entry on 07/15/2019 P: Continue supplemental oxygen  for sat goal greater than 90% Wean oxygen  as able to home 2L Will PRN BIPAP at night and during day rest  Continue steroids can wean to oral over the next couple days  Continue empiric Ceftriaxone  and Doxy x5 days  Encourage pulmonary hygiene  Mobilize as able  Follow CT chest  As needed BDs  PCCM will sign off. Thank you for the opportunity to participate in this patient's care. Please contact if we can be of further assistance.  Best Practice (right click and Reselect all SmartList Selections daily)  Per primary   Critical care time: NA  Morgan Embry D. Harris, NP-C [facility] Personal contact information can be found on Amion  If no contact or response made please call [phone] 08/27/2023, 9:45 AM

## 2023-08-28 ENCOUNTER — Inpatient Hospital Stay (HOSPITAL_COMMUNITY)

## 2023-08-28 ENCOUNTER — Other Ambulatory Visit: Payer: Self-pay

## 2023-08-28 DIAGNOSIS — R579 Shock, unspecified: Secondary | ICD-10-CM

## 2023-08-28 DIAGNOSIS — J441 Chronic obstructive pulmonary disease with (acute) exacerbation: Secondary | ICD-10-CM | POA: Diagnosis not present

## 2023-08-28 DIAGNOSIS — U071 COVID-19: Secondary | ICD-10-CM | POA: Diagnosis not present

## 2023-08-28 DIAGNOSIS — R0603 Acute respiratory distress: Secondary | ICD-10-CM

## 2023-08-28 DIAGNOSIS — N179 Acute kidney failure, unspecified: Secondary | ICD-10-CM

## 2023-08-28 DIAGNOSIS — J9622 Acute and chronic respiratory failure with hypercapnia: Secondary | ICD-10-CM | POA: Diagnosis not present

## 2023-08-28 DIAGNOSIS — E119 Type 2 diabetes mellitus without complications: Secondary | ICD-10-CM

## 2023-08-28 DIAGNOSIS — J811 Chronic pulmonary edema: Secondary | ICD-10-CM

## 2023-08-28 DIAGNOSIS — I421 Obstructive hypertrophic cardiomyopathy: Secondary | ICD-10-CM

## 2023-08-28 DIAGNOSIS — J9621 Acute and chronic respiratory failure with hypoxia: Secondary | ICD-10-CM | POA: Diagnosis not present

## 2023-08-28 LAB — COMPREHENSIVE METABOLIC PANEL WITH GFR
ALT: 27 U/L (ref 0–44)
AST: 24 U/L (ref 15–41)
Albumin: 3.3 g/dL — ABNORMAL LOW (ref 3.5–5.0)
Alkaline Phosphatase: 42 U/L (ref 38–126)
Anion gap: 13 (ref 5–15)
BUN: 70 mg/dL — ABNORMAL HIGH (ref 8–23)
CO2: 36 mmol/L — ABNORMAL HIGH (ref 22–32)
Calcium: 8.9 mg/dL (ref 8.9–10.3)
Chloride: 95 mmol/L — ABNORMAL LOW (ref 98–111)
Creatinine, Ser: 1.84 mg/dL — ABNORMAL HIGH (ref 0.44–1.00)
GFR, Estimated: 28 mL/min — ABNORMAL LOW (ref >60)
Glucose, Bld: 127 mg/dL — ABNORMAL HIGH (ref 70–99)
Potassium: 4 mmol/L (ref 3.5–5.1)
Sodium: 144 mmol/L (ref 135–145)
Total Bilirubin: 0.5 mg/dL (ref 0.0–1.2)
Total Protein: 6.5 g/dL (ref 6.5–8.1)

## 2023-08-28 LAB — BLOOD GAS, ARTERIAL
Acid-Base Excess: 12.6 mmol/L — ABNORMAL HIGH (ref 0.0–2.0)
Acid-Base Excess: 12.2 mmol/L — ABNORMAL HIGH (ref 0.0–2.0)
Bicarbonate: 42.9 mmol/L — ABNORMAL HIGH (ref 20.0–28.0)
Bicarbonate: 41.5 mmol/L — ABNORMAL HIGH (ref 20.0–28.0)
Delivery systems: POSITIVE
Delivery systems: POSITIVE
Drawn by: 331471
FIO2: 50 %
FIO2: 40 %
Inspiratory PAP: 8 cmH2O
O2 Saturation: 98.5 %
O2 Saturation: 99.2 %
PEEP: 14 cmH2O
PEEP: 8 cmH2O
Patient temperature: 37
Patient temperature: 36.7
Pressure control: 16 cmH2O
RATE: 16 {breaths}/min
RATE: 20 {breaths}/min
pCO2 arterial: 100 mmHg (ref 32–48)
pCO2 arterial: 76 mmHg (ref 32–48)
pH, Arterial: 7.24 — ABNORMAL LOW (ref 7.35–7.45)
pH, Arterial: 7.34 — ABNORMAL LOW (ref 7.35–7.45)
pO2, Arterial: 117 mmHg — ABNORMAL HIGH (ref 83–108)
pO2, Arterial: 119 mmHg — ABNORMAL HIGH (ref 83–108)

## 2023-08-28 LAB — CBC WITH DIFFERENTIAL/PLATELET
Abs Immature Granulocytes: 0.05 K/uL (ref 0.00–0.07)
Basophils Absolute: 0 K/uL (ref 0.0–0.1)
Basophils Relative: 0 %
Eosinophils Absolute: 0 K/uL (ref 0.0–0.5)
Eosinophils Relative: 0 %
HCT: 39.1 % (ref 36.0–46.0)
Hemoglobin: 10.5 g/dL — ABNORMAL LOW (ref 12.0–15.0)
Immature Granulocytes: 1 %
Lymphocytes Relative: 4 %
Lymphs Abs: 0.4 K/uL — ABNORMAL LOW (ref 0.7–4.0)
MCH: 26.7 pg (ref 26.0–34.0)
MCHC: 26.9 g/dL — ABNORMAL LOW (ref 30.0–36.0)
MCV: 99.5 fL (ref 80.0–100.0)
Monocytes Absolute: 0.9 K/uL (ref 0.1–1.0)
Monocytes Relative: 9 %
Neutro Abs: 8.3 K/uL — ABNORMAL HIGH (ref 1.7–7.7)
Neutrophils Relative %: 86 %
Platelets: 98 K/uL — ABNORMAL LOW (ref 150–400)
RBC: 3.93 MIL/uL (ref 3.87–5.11)
RDW: 17.2 % — ABNORMAL HIGH (ref 11.5–15.5)
WBC: 9.6 K/uL (ref 4.0–10.5)
nRBC: 0.4 % — ABNORMAL HIGH (ref 0.0–0.2)

## 2023-08-28 LAB — GLUCOSE, CAPILLARY
Glucose-Capillary: 134 mg/dL — ABNORMAL HIGH (ref 70–99)
Glucose-Capillary: 202 mg/dL — ABNORMAL HIGH (ref 70–99)
Glucose-Capillary: 171 mg/dL — ABNORMAL HIGH (ref 70–99)
Glucose-Capillary: 126 mg/dL — ABNORMAL HIGH (ref 70–99)

## 2023-08-28 LAB — MAGNESIUM: Magnesium: 2.8 mg/dL — ABNORMAL HIGH (ref 1.7–2.4)

## 2023-08-28 MED ORDER — SODIUM CHLORIDE 0.9% FLUSH: 10.0000 mL | INTRAVENOUS | Status: DC | PRN

## 2023-08-28 MED ORDER — SODIUM CHLORIDE 0.9 % IV SOLN: INTRAVENOUS | Status: AC | PRN

## 2023-08-28 MED ORDER — MELATONIN 5 MG PO TABS
5.0000 mg | ORAL_TABLET | Freq: Every evening | ORAL | Status: DC | PRN
  Administered 2023-08-28: 5 mg via ORAL
  Filled 2023-08-28: qty 1

## 2023-08-28 MED ORDER — ACETAMINOPHEN 10 MG/ML IV SOLN
1000.0000 mg | Freq: Once | INTRAVENOUS | Status: AC
  Administered 2023-08-28: 1000 mg via INTRAVENOUS
  Filled 2023-08-28: qty 100

## 2023-08-28 MED ORDER — NOREPINEPHRINE 4 MG/250ML-% IV SOLN
0.0000 ug/min | INTRAVENOUS | Status: DC
  Administered 2023-08-28 (×2): 2 ug/min via INTRAVENOUS
  Administered 2023-08-29: 10 ug/min via INTRAVENOUS
  Administered 2023-08-29 – 2023-09-08 (×2): 8 ug/min via INTRAVENOUS
  Administered 2023-09-08: 2 ug/min via INTRAVENOUS
  Administered 2023-09-08: 13 ug/min via INTRAVENOUS
  Administered 2023-09-09: 10 ug/min via INTRAVENOUS
  Administered 2023-09-09: 8 ug/min via INTRAVENOUS
  Administered 2023-09-09: 11 ug/min via INTRAVENOUS
  Administered 2023-09-09: 12 ug/min via INTRAVENOUS
  Filled 2023-08-28 (×10): qty 250

## 2023-08-28 MED ORDER — SODIUM CHLORIDE 0.9% FLUSH
10.0000 mL | Freq: Two times a day (BID) | INTRAVENOUS | Status: DC
  Administered 2023-08-28 – 2023-08-29 (×2): 10 mL
  Administered 2023-08-29: 40 mL
  Administered 2023-08-30: 30 mL
  Administered 2023-08-30 – 2023-08-31 (×3): 10 mL
  Administered 2023-09-01: 20 mL
  Administered 2023-09-01: 10 mL
  Administered 2023-09-02: 20 mL
  Administered 2023-09-02 – 2023-09-03 (×2): 10 mL
  Administered 2023-09-04: 20 mL
  Administered 2023-09-04: 30 mL
  Administered 2023-09-05 – 2023-09-09 (×9): 10 mL
  Administered 2023-09-09: 30 mL

## 2023-08-28 MED ORDER — MIDODRINE HCL 5 MG PO TABS
10.0000 mg | ORAL_TABLET | Freq: Three times a day (TID) | ORAL | Status: DC
  Administered 2023-08-28 – 2023-08-29 (×2): 10 mg via ORAL
  Filled 2023-08-28 (×2): qty 2

## 2023-08-28 MED ORDER — LIDOCAINE HCL (PF) 1 % IJ SOLN
5.0000 mL | Freq: Once | INTRAMUSCULAR | Status: AC
  Administered 2023-08-28: 5 mL
  Filled 2023-08-28: qty 5

## 2023-08-28 MED ORDER — PIPERACILLIN-TAZOBACTAM 3.375 G IVPB
3.3750 g | Freq: Three times a day (TID) | INTRAVENOUS | Status: DC
  Administered 2023-08-28 – 2023-09-01 (×12): 3.375 g via INTRAVENOUS
  Filled 2023-08-28 (×12): qty 50

## 2023-08-28 MED ORDER — FUROSEMIDE 10 MG/ML IJ SOLN
60.0000 mg | Freq: Once | INTRAMUSCULAR | Status: AC
  Administered 2023-08-28: 60 mg via INTRAVENOUS
  Filled 2023-08-28: qty 6

## 2023-08-28 NOTE — Progress Notes (Signed)
 PROGRESS NOTE    Morgan Fischer  FMW:[id number] DOB: [birth year] DOA: 08/26/2023 PCP: Antonio Cyndee [name] [name], DO    Brief Narrative:  Patient with known history of chronic hypoxemic and hypercapnic respiratory failure on daytime oxygen  2 L and nighttime BiPAP, chronic diastolic congestive heart failure admitted with about 5 days of shortness of breath, cough.  At the emergency room initially on 5 L oxygen  subsequently placed on BiPAP.  VBG with pH 7.28, PCO258.  WBC normal.  Creatinine 1.5.  proBNP 22,000.  Chest x-ray with cardiomegaly and pulmonary vascular congestion.  COVID-19 positive.  Subjective:  Patient seen and examined.  On my exam in the morning rounds, patient was having occasional myoclonic jerks, she had very poor inspiratory effort.  On BiPAP on 50% FiO2.  Felt better after continuously being on a BiPAP. Will check chest x-ray to rule out any complications. Creatinine 1.8, hold morning dose of Lasix .   Assessment & Plan:   Acute on chronic hypoxemic and hypercapnic respiratory failure likely secondary to COVID-19 infection causing exacerbation of diastolic heart failure. Keep on oxygen  to keep saturation more than 90%.  Keep on BiPAP for respiratory distress and also at night. Uses 2 L oxygen  at home and BiPAP at night at home. Does have history of hypertrophic cardiomyopathy, EF more than 55%. On IV Lasix , holding today.  Cardiology following. Continue Norpace , Toprol  XL.  Further recommendation as per cardiology.  Repeat echocardiogram with no new changes.  Ejection fraction 70 to 75%.  Left ventricular hyperdynamic, severe concentric left ventricular hypertrophy.  COPD with acute exacerbation secondary to COVID-19 infection: Aggressive bronchodilator therapy, IV steroids.inhalational steroids, scheduled and as needed bronchodilators, deep breathing exercises, incentive spirometry, chest physiotherapy.  Physical and Occupational Therapy to mobilize. Antibiotics due to  severity of symptoms.  Doxycycline  and Rocephin  for 7 days. Supplemental oxygen  to keep saturations more than 90%.  BiPAP as above. CT scan of the chest, patchy opacities right lung, mostly right lower lobe.  Mediastinal lymphadenopathy. Blood cultures were not drawn before admissions. Procalcitonin 0.9.  Type 2 diabetes: On insulin .  Aggravated with use of steroids.  Continue insulin .  Hyperlipidemia: On statin.  AKI on CKD stage IIIa: Creatinine slightly elevated.  Close monitoring.  Recheck tomorrow morning.  Lasix  on hold as above.    Stay at stepdown unit due to ongoing requirement for BiPAP.   DVT prophylaxis: enoxaparin  (LOVENOX ) injection 40 mg Start: 08/26/23 2200 SCDs Start: 08/26/23 1758   Code Status: Full code Family Communication: None at the bedside Disposition Plan: Status is: Inpatient Remains inpatient appropriate because: Significant shortness of breath, BiPAP need     Consultants:  Pulmonary Cardiology  Procedures:  None  Antimicrobials:  Rocephin  and doxycycline  8/24--     Objective: Vitals:   08/28/23 0925 08/28/23 1016 08/28/23 1143 08/28/23 1205  BP:  (!) 132/47 (!) 135/115 (!) 105/47  Pulse:   68 63  Resp:  (!) 24 (!) 22 20  Temp:   98.8 F (37.1 C)   TempSrc:   Axillary   SpO2: 97%  95% 95%  Weight:      Height:        Intake/Output Summary (Last 24 hours) at 08/28/2023 1336 Last data filed at 08/28/2023 1117 Gross per 24 hour  Intake 640.2 ml  Output 1000 ml  Net -359.8 ml   Filed Weights   08/26/23 1201 08/26/23 1700 08/27/23 0500  Weight: 134.6 kg 127.4 kg 126.3 kg    Examination:  General exam: Appeared  anxious in the morning rounds.  She was in mild respiratory distress. Respiratory system: Clear to auscultation. Respiratory effort reduced.  No added sound but poor air entry bilateral. Cardiovascular system: S1 & S2 heard, RRR. No JVD, murmurs, rubs, gallops or clicks.  Trace edema.  Chronic skin  changes. Gastrointestinal system: Abdomen is nondistended, soft and nontender. No organomegaly or masses felt. Normal bowel sounds heard.  Obese pendulous. Central nervous system: Alert and oriented. No focal neurological deficits. Extremities: Symmetric 5 x 5 power. Skin: No rashes, lesions or ulcers Psychiatry: Judgement and insight appear normal. Mood & affect appropriate.     Data Reviewed: I have personally reviewed following labs and imaging studies  CBC: Recent Labs  Lab 08/26/23 1217 08/26/23 1401 08/26/23 1822 08/27/23 0221 08/28/23 0335  WBC 8.5  --  6.6 5.8 9.6  NEUTROABS 6.7  --   --   --  8.3*  HGB 10.3* 10.2* 10.0* 9.5* 10.5*  HCT 35.2* 30.0* 36.4 34.2* 39.1  MCV 91.2  --  93.6 94.7 99.5  PLT 104*  --  97* 100* 98*   Basic Metabolic Panel: Recent Labs  Lab 08/26/23 1216 08/26/23 1217 08/26/23 1401 08/26/23 1822 08/27/23 0221 08/28/23 0335  NA 142 146* 143  --  144 144  K 3.2* 3.5 3.5  --  3.7 4.0  CL  --  97*  --   --  97* 95*  CO2  --  35*  --   --  35* 36*  GLUCOSE  --  183*  --   --  186* 127*  BUN  --  45*  --   --  54* 70*  CREATININE  --  1.50*  --  1.49* 1.48* 1.84*  CALCIUM   --  9.5  --   --  8.5* 8.9  MG  --   --   --   --   --  2.8*   GFR: Estimated Creatinine Clearance: 34.7 mL/min (A) (by C-G formula based on SCr of 1.84 mg/dL (H)). Liver Function Tests: Recent Labs  Lab 08/26/23 1217 08/27/23 0221 08/28/23 0335  AST 48* 29 24  ALT 33 29 27  ALKPHOS 57 48 42  BILITOT 0.7 0.8 0.5  PROT 6.8 6.3* 6.5  ALBUMIN 4.1 3.2* 3.3*   No results for input(s): LIPASE, AMYLASE in the last 168 hours. No results for input(s): AMMONIA in the last 168 hours. Coagulation Profile: No results for input(s): INR, PROTIME in the last 168 hours. Cardiac Enzymes: No results for input(s): CKTOTAL, CKMB, CKMBINDEX, TROPONINI in the last 168 hours. BNP (last 3 results) Recent Labs    08/16/23 1059 08/26/23 1217  PROBNP 1,497.0*  22,304.0*   HbA1C: No results for input(s): HGBA1C in the last 72 hours. CBG: Recent Labs  Lab 08/27/23 1617 08/27/23 1807 08/27/23 2104 08/28/23 0748 08/28/23 1125  GLUCAP 228* 228* 192* 134* 202*   Lipid Profile: No results for input(s): CHOL, HDL, LDLCALC, TRIG, CHOLHDL, LDLDIRECT in the last 72 hours. Thyroid  Function Tests: No results for input(s): TSH, T4TOTAL, FREET4, T3FREE, THYROIDAB in the last 72 hours. Anemia Panel: Recent Labs    08/26/23 1910  FERRITIN 122   Sepsis Labs: Recent Labs  Lab 08/26/23 1822  PROCALCITON 0.92    Recent Results (from the past 240 hours)  Resp panel by RT-PCR (RSV, Flu A&B, Covid) Anterior Nasal Swab     Status: Abnormal   Collection Time: 08/26/23 12:17 PM   Specimen: Anterior Nasal Swab  Result Value  Ref Range Status   SARS Coronavirus 2 by RT PCR POSITIVE (A) NEGATIVE Final    Comment: (NOTE) SARS-CoV-2 target nucleic acids are DETECTED.  The SARS-CoV-2 RNA is generally detectable in upper respiratory specimens during the acute phase of infection. Positive results are indicative of the presence of the identified virus, but do not rule out bacterial infection or co-infection with other pathogens not detected by the test. Clinical correlation with patient history and other diagnostic information is necessary to determine patient infection status. The expected result is Negative.  Fact Sheet for Patients: BloggerCourse.com  Fact Sheet for Healthcare Providers: SeriousBroker.it  This test is not yet approved or cleared by the [place]  FDA and  has been authorized for detection and/or diagnosis of SARS-CoV-2 by FDA under an Emergency Use Authorization (EUA).  This EUA will remain in effect (meaning this test can be used) for the duration of  the COVID-19 declaration under Section 564(b)(1) of the A ct, 21 U.S.C. section 360bbb-3(b)(1), unless  the authorization is terminated or revoked sooner.     Influenza A by PCR NEGATIVE NEGATIVE Final   Influenza B by PCR NEGATIVE NEGATIVE Final    Comment: (NOTE) The Xpert Xpress SARS-CoV-2/FLU/RSV plus assay is intended as an aid in the diagnosis of influenza from Nasopharyngeal swab specimens and should not be used as a sole basis for treatment. Nasal washings and aspirates are unacceptable for Xpert Xpress SARS-CoV-2/FLU/RSV testing.  Fact Sheet for Patients: BloggerCourse.com  Fact Sheet for Healthcare Providers: SeriousBroker.it  This test is not yet approved or cleared by the [place]  FDA and has been authorized for detection and/or diagnosis of SARS-CoV-2 by FDA under an Emergency Use Authorization (EUA). This EUA will remain in effect (meaning this test can be used) for the duration of the COVID-19 declaration under Section 564(b)(1) of the Act, 21 U.S.C. section 360bbb-3(b)(1), unless the authorization is terminated or revoked.     Resp Syncytial Virus by PCR NEGATIVE NEGATIVE Final    Comment: (NOTE) Fact Sheet for Patients: BloggerCourse.com  Fact Sheet for Healthcare Providers: SeriousBroker.it  This test is not yet approved or cleared by the [place]  FDA and has been authorized for detection and/or diagnosis of SARS-CoV-2 by FDA under an Emergency Use Authorization (EUA). This EUA will remain in effect (meaning this test can be used) for the duration of the COVID-19 declaration under Section 564(b)(1) of the Act, 21 U.S.C. section 360bbb-3(b)(1), unless the authorization is terminated or revoked.  Performed at [facility], [address] [place], [place] [address]   MRSA Next Gen by PCR, Nasal     Status: None   Collection Time: 08/26/23  5:06 PM   Specimen: Nasal Mucosa; Nasal Swab  Result Value Ref Range Status   MRSA by PCR Next  Gen NOT DETECTED NOT DETECTED Final    Comment: (NOTE) The GeneXpert MRSA Assay (FDA approved for NASAL specimens only), is one component of a comprehensive MRSA colonization surveillance program. It is not intended to diagnose MRSA infection nor to guide or monitor treatment for MRSA infections. Test performance is not FDA approved in patients less than 53 years old. Performed at [facility], [address] [place], [place] [address]          Radiology Studies: ECHOCARDIOGRAM COMPLETE Result Date: 08/27/2023    ECHOCARDIOGRAM REPORT   Patient Name:   MAXX CALAWAY Date of Exam: 08/27/2023 Medical Rec #:  [id number]    Height:  63.0 in Accession #:    [id number]   Weight:       278.4 lb Date of Birth:  [birth year]    BSA:          2.226 m Patient Age:    72 years     BP:           155/122 mmHg Patient Gender: F            HR:           63 bpm. Exam Location:  Inpatient Procedure: Cardiac Doppler and Color Doppler (Both Spectral and Color Flow            Doppler were utilized during procedure). Indications:     HOCM  History:         Patient has prior history of Echocardiogram examinations, most                  recent 03/19/2020. Cardiomyopathy and Hypertrophic                  Cardiomyopathy, Aortic Valve Disease, Signs/Symptoms:Murmur;                  Risk Factors:Hypertension and Diabetes.  Sonographer:     Therisa Crouch Referring Phys:  [id number] SALENA NEGRI Diagnosing Phys: SALENA NEGRI MD IMPRESSIONS  1. No significant difference in LV function and structure from prior study. Left ventricular ejection fraction, by estimation, is 70 to 75%. The left ventricle has hyperdynamic function. The left ventricle has no regional wall motion abnormalities. There is severe concentric left ventricular hypertrophy. Left ventricular diastolic parameters are consistent with Grade II diastolic dysfunction (pseudonormalization).  2. Right ventricular systolic function is normal. The right  ventricular size is mildly enlarged.  3. Left atrial size was severely dilated.  4. Right atrial size was mild to moderately dilated.  5. Systolic anterior motion of MV leaflet. The mitral valve is degenerative. Mild mitral valve regurgitation. Moderate mitral annular calcification.  6. The aortic valve is tricuspid. There is mild calcification of the aortic valve. There is mild thickening of the aortic valve. Aortic valve regurgitation is not visualized. Aortic valve sclerosis/calcification is present, without any evidence of aortic stenosis.  7. There is mild (Grade II) atheroma plaque involving the aortic root.  8. The inferior vena cava is dilated in size with <50% respiratory variability, suggesting right atrial pressure of 15 mmHg. Conclusion(s)/Recommendation(s): Findings consistent with hypertrophic obstructive cardiomyopathy. FINDINGS  Left Ventricle: No significant difference in LV function and structure from prior study. Left ventricular ejection fraction, by estimation, is 70 to 75%. The left ventricle has hyperdynamic function. The left ventricle has no regional wall motion abnormalities. Definity  contrast agent was given IV to delineate the left ventricular endocardial borders. The left ventricular internal cavity size was small. There is severe concentric left ventricular hypertrophy. Left ventricular diastolic parameters  are consistent with Grade II diastolic dysfunction (pseudonormalization). Right Ventricle: The right ventricular size is mildly enlarged. No increase in right ventricular wall thickness. Right ventricular systolic function is normal. Left Atrium: Left atrial size was severely dilated. Right Atrium: Right atrial size was mild to moderately dilated. Pericardium: There is no evidence of pericardial effusion. Mitral Valve: Systolic anterior motion of MV leaflet. The mitral valve is degenerative in appearance. There is mild thickening of the mitral valve leaflet(s). There is mild  calcification of the mitral valve leaflet(s). Normal mobility of the mitral valve  leaflets. Moderate mitral annular calcification. Mild mitral  valve regurgitation. Tricuspid Valve: The tricuspid valve is grossly normal. Tricuspid valve regurgitation is mild. Aortic Valve: The aortic valve is tricuspid. There is mild calcification of the aortic valve. There is mild thickening of the aortic valve. There is mild aortic valve annular calcification. Aortic valve regurgitation is not visualized. Aortic valve sclerosis/calcification is present, without any evidence of aortic stenosis. Aortic valve mean gradient measures 13.0 mmHg. Aortic valve peak gradient measures 25.0 mmHg. Pulmonic Valve: The pulmonic valve was normal in structure. Pulmonic valve regurgitation is mild. Aorta: The aortic root is normal in size and structure. There is mild (Grade II) atheroma plaque involving the aortic root. Venous: The inferior vena cava is dilated in size with less than 50% respiratory variability, suggesting right atrial pressure of 15 mmHg. IAS/Shunts: The atrial septum is grossly normal.  LEFT VENTRICLE PLAX 2D LVIDd:         5.00 cm   Diastology LVIDs:         2.80 cm   LV e' medial:    2.33 cm/s LV PW:         1.30 cm   LV E/e' medial:  43.8 LV IVS:        2.10 cm   LV e' lateral:   4.05 cm/s LVOT diam:     1.80 cm   LV E/e' lateral: 25.2 LVOT Area:     2.54 cm  RIGHT VENTRICLE            IVC RV S prime:     8.63 cm/s  IVC diam: 2.30 cm TAPSE (M-mode): 2.1 cm LEFT ATRIUM              Index LA diam:        4.80 cm  2.16 cm/m LA Vol (A2C):   128.0 ml 57.51 ml/m LA Vol (A4C):   116.0 ml 52.12 ml/m LA Biplane Vol: 129.0 ml 57.96 ml/m  AORTIC VALVE AV Vmax:      250.00 cm/s AV Vmean:     170.000 cm/s AV VTI:       0.559 m AV Peak Grad: 25.0 mmHg AV Mean Grad: 13.0 mmHg  AORTA Ao Root diam: 2.90 cm Ao Asc diam:  3.80 cm MITRAL VALVE                TRICUSPID VALVE MV Area (PHT): 2.74 cm     TV Peak grad:   63.0 mmHg MV Decel  Time: 277 msec     TV Vmax:        3.97 m/s MV E velocity: 102.00 cm/s  TR Peak grad:   63.0 mmHg MV A velocity: 75.40 cm/s   TR Vmax:        397.00 cm/s MV E/A ratio:  1.35                             SHUNTS                             Systemic Diam: 1.80 cm Salena Negri MD Electronically signed by Salena Negri MD Signature Date/Time: 08/27/2023/5:34:50 PM    Final    CT CHEST WO CONTRAST Result Date: 08/27/2023 CLINICAL DATA:  Shortness of breath. EXAM: CT CHEST WITHOUT CONTRAST TECHNIQUE: Multidetector CT imaging of the chest was performed following the standard protocol without IV contrast. RADIATION DOSE REDUCTION: This exam was performed according to the departmental  dose-optimization program which includes automated exposure control, adjustment of the mA and/or kV according to patient size and/or use of iterative reconstruction technique. COMPARISON:  CT chest 04/08/2023 FINDINGS: Cardiovascular: Atherosclerotic calcifications in the thoracic aorta. Stable cardiac enlargement. Small amount of pericardial fluid. Mediastinum/Nodes: Right paratracheal lymph node on image 21/2 measures 1.5 cm in the short axis and previously measured 1.2 cm. Precarinal lymph node measures 1.5 cm and previously measured 1.2 cm. Overall, mediastinal lymph nodes have slightly enlarged in size. Limited evaluation for hilar lymph nodes without intravascular contrast. No significant axillary lymph node enlargement. Lungs/Pleura: Trachea and mainstem bronchi are patent. Patchy peripheral opacities in the right lower lobe nonspecific and could be associated with areas of atelectasis versus infection. Subtle patchy densities in the right upper lobe. No large pleural effusions. Overall, low lung volumes. Upper Abdomen: Limited evaluation of the upper abdomen without gross acute abnormality. Musculoskeletal: No acute bone abnormality. IMPRESSION: 1. Patchy opacities in the right lung, particularly the right lower lobe. Findings are  nonspecific and could be associated with areas of atelectasis versus infection. 2. Mediastinal lymph nodes have slightly enlarged in size. These lymph nodes are nonspecific and could be reactive. 3. Aortic Atherosclerosis (ICD10-I70.0). 4. Cardiomegaly. Electronically Signed   By: Juliene Balder M.D.   On: 08/27/2023 13:44        Scheduled Meds:  acidophilus  1 capsule Oral Q1500   aspirin  EC  81 mg Oral Daily   Chlorhexidine  Gluconate Cloth  6 each Topical Daily   disopyramide   100 mg Oral TID   doxycycline   100 mg Oral Q12H   enoxaparin  (LOVENOX ) injection  40 mg Subcutaneous Q24H   gabapentin   200 mg Oral Daily   insulin  aspart  0-15 Units Subcutaneous TID WC   insulin  glargine-yfgn  5 Units Subcutaneous QHS   ipratropium-albuterol   3 mL Inhalation Q6H   linagliptin   5 mg Oral Daily   methylPREDNISolone  (SOLU-MEDROL ) injection  40 mg Intravenous Daily   metoprolol  succinate  50 mg Oral Daily   montelukast   10 mg Oral QHS   multivitamin with minerals  1 tablet Oral Daily   mouth rinse  15 mL Mouth Rinse 4 times per day   rosuvastatin   10 mg Oral Daily   senna  1 tablet Oral BID   sertraline   150 mg Oral Daily   Continuous Infusions:  cefTRIAXone  (ROCEPHIN )  IV Stopped (08/27/23 2141)     LOS: 2 days    Time spent: 55 minutes    Renato Applebaum, MD [facility]

## 2023-08-28 NOTE — Progress Notes (Signed)
 Removed PT from BiPAP post neb and placed on 5 LPM [place]- RN aware. PT does not appear to be in respiratory distress at this time. PT states she is breathing fine.

## 2023-08-28 NOTE — Progress Notes (Signed)
 Physical Therapy Treatment Patient Details Name: Morgan Fischer MRN: [id number] DOB: 1950-03-01 Today's Date: 08/28/2023   History of Present Illness Pt is 73 yo female admitted on 08/26/23 with acute on chronic resp failure.  Pt tested + for COVID with underlying hx of COPD.  Pt with other hx including but not limited to CHF,  hypertrophic cardiomyopathy, chronic kidney disease stage IIIa, type 2 diabetes asthma/COPD, morbid obesity    PT Comments  Pt on Bipap today but wanting to get to chair and discussed with RN and felt upright positioning could help with breathing.  However, during attempt at transfers noted pt with myoclonic jerks (pt with hypoxemia/hypercapnic resp failure and elevated creatine).  Determined not safe for pivot but did stand with max x 2 to try to move toward [facility] but knees buckled and returned to EOB requiring assist of 4 to get repositioned up in bed and into chair position.  VSS throughout session and pt in chair position at end of session.  Due to declined mobility did update recommendation to Patient will benefit from continued inpatient follow up therapy, <3 hours/day at d/c.    If plan is discharge home, recommend the following: Two people to help with walking and/or transfers;Two people to help with bathing/dressing/bathroom   Can travel by private vehicle     No  Equipment Recommendations  None recommended by PT    Recommendations for Other Services       Precautions / Restrictions Precautions Precautions: Fall     Mobility  Bed Mobility Overal bed mobility: Needs Assistance Bed Mobility: Rolling Rolling: Mod assist   Supine to sit: Min assist, Used rails Sit to supine: Max assist, +2 for physical assistance   General bed mobility comments: Increased time and cues for transition to EOB; max x 2 back to bed due to positioning near EOB and then +4 to reposition up in bed    Transfers Overall transfer level: Needs assistance Equipment used: Rolling  walker (2 wheels) Transfers: Sit to/from Stand Sit to Stand: Max assist, +2 physical assistance           General transfer comment: Pt on Bipap but having difficulty breathing in bed.  RN reports ok for therapy and upright position in chair could help out.  Pt also agreeable and would like to be in chair.  At EOB, pt having myoclonic jerking motions and reports some dizziness.  All VSS on Bipap.  Sat EOB for at least 5 mins then tech into assist. Pt wanting to get to chair. Attempting standing x 2 for transfer and needing max A and arms jerking and falling off RW.  Determined not safe for a pivot.  Discussed return to bed with pt but she was at foot of bed.  Had pt stand with max x 2 and attempt to move toward [facility] but knees buckled and returned to EOB.  Pt near EOB and foot of bed so, foot board removed, and max x 2 pivot back to supine, then +4 to slide up in bed.    Ambulation/Gait                   Stairs             Wheelchair Mobility     Tilt Bed    Modified Rankin (Stroke Patients Only)       Balance Overall balance assessment: Needs assistance Sitting-balance support: No upper extremity supported Sitting balance-Leahy Scale: Good     Standing  balance support: Bilateral upper extremity supported, Reliant on assistive device for balance Standing balance-Leahy Scale: Poor                              Communication Communication Communication: Impaired Factors Affecting Communication: Reduced clarity of speech  Cognition Arousal: Alert Behavior During Therapy: WFL for tasks assessed/performed   PT - Cognitive impairments: Difficult to assess                       PT - Cognition Comments: Pt on Bipap -difficult to understand but is alert, following commands, agreeable/eager for therapy        Cueing    Exercises      General Comments General comments (skin integrity, edema, etc.): Pt on Bipap with VSS during session       Pertinent Vitals/Pain Pain Assessment Pain Assessment: No/denies pain    Home Living                          Prior Function            PT Goals (current goals can now be found in the care plan section) Progress towards PT goals: Not progressing toward goals - comment (on bipap today)    Frequency    Min 2X/week      PT Plan      Co-evaluation              AM-PAC PT 6 Clicks Mobility   Outcome Measure  Help needed turning from your back to your side while in a flat bed without using bedrails?: A Lot Help needed moving from lying on your back to sitting on the side of a flat bed without using bedrails?: Total Help needed moving to and from a bed to a chair (including a wheelchair)?: Total Help needed standing up from a chair using your arms (e.g., wheelchair or bedside chair)?: Total Help needed to walk in hospital room?: Total Help needed climbing 3-5 steps with a railing? : Total 6 Click Score: 7    End of Session Equipment Utilized During Treatment: Gait belt;Oxygen  Activity Tolerance: Patient tolerated treatment well Patient left: with call bell/phone within reach;in bed;with bed alarm set Nurse Communication: Mobility status PT Visit Diagnosis: Other abnormalities of gait and mobility (R26.89);Muscle weakness (generalized) (M62.81)     Time: 8941-8873 PT Time Calculation (min) (ACUTE ONLY): 28 min  Charges:    $Therapeutic Activity: 23-37 mins PT General Charges $$ ACUTE PT VISIT: 1 Visit                     Benjiman, PT Acute Rehab [facility] [phone]    Benjiman VEAR Mulberry 08/28/2023, 12:39 PM

## 2023-08-28 NOTE — Progress Notes (Signed)
 Peripherally Inserted Central Catheter Placement  The IV Nurse has discussed with the patient and/or persons authorized to consent for the patient, the purpose of this procedure and the potential benefits and risks involved with this procedure.  The benefits include less needle sticks, lab draws from the catheter, and the patient may be discharged home with the catheter. Risks include, but not limited to, infection, bleeding, blood clot (thrombus formation), and puncture of an artery; nerve damage and irregular heartbeat and possibility to perform a PICC exchange if needed/ordered by physician.  Alternatives to this procedure were also discussed.  Bard Power PICC patient education guide, fact sheet on infection prevention and patient information card has been provided to patient /or left at bedside.    PICC Placement Documentation  PICC Double Lumen 08/28/23 Right Basilic 42 cm 1 cm (Active)  Indication for Insertion or Continuance of Line Vasoactive infusions 08/28/23 1605  Exposed Catheter (cm) 1 cm 08/28/23 1605  Site Assessment Clean, Dry, Intact 08/28/23 1605  Lumen #1 Status Flushed;Blood return noted;Saline locked 08/28/23 1605  Lumen #2 Status Flushed;Blood return noted;Saline locked 08/28/23 1605  Dressing Type Transparent 08/28/23 1605  Dressing Status Antimicrobial disc/dressing in place 08/28/23 1605  Line Care Connections checked and tightened 08/28/23 1605  Line Adjustment (NICU/IV Team Only) No 08/28/23 1605  Dressing Intervention New dressing 08/28/23 1605  Dressing Change Due 09/03/23 08/28/23 1605       Morgan Fischer 08/28/2023, 4:07 PM

## 2023-08-28 NOTE — Procedures (Signed)
 Arterial Catheter Insertion Procedure Note  Morgan Fischer  [id number]  02-26-1950  Date:08/28/23  Time:5:38 PM    Provider Performing: Corean CHRISTELLA Caleah Tortorelli   Procedure: Insertion of Arterial Line (63379) with US  guidance (23062)   Indication(s): Blood pressure monitoring and/or need for frequent ABGs  Consent: Risks of the procedure as well as the alternatives and risks of each were explained to the patient and/or caregiver.  Consent for the procedure was obtained verbally from patient.  Anesthesia: Lidocaine  1% 3mL local injection  Time Out: Verified patient identification, verified procedure, site/side was marked, verified correct patient position, special equipment/implants available, medications/allergies/relevant history reviewed, required imaging and test results available.  Sterile Technique: Maximal sterile technique including full sterile barrier drape, hand hygiene, sterile gown, sterile gloves, mask, hair covering, sterile ultrasound probe cover (if used).  Procedure Description: Area of catheter insertion was cleaned with chlorhexidine  and draped in sterile fashion. With real-time ultrasound guidance an arterial catheter was placed into the left radial artery.  Appropriate arterial tracings confirmed on monitor.    Complications/Tolerance: None; patient tolerated the procedure well.  EBL: Minimal  Specimen(s): None  Corean CHRISTELLA Johathon Overturf, [place] [facility] 08/28/23 5:40 PM  Please see [email or web address] for pager details.  From 7A-7P if no response, please call [phone] After hours, please call ELink [phone]

## 2023-08-28 NOTE — Consult Note (Signed)
 Ref: Antonio Cyndee [name] [name], DO   Subjective:  Patient with HOCM and COVID + pneumonia. Lasix  on hold for elevated creatinine.  Objective:  Vital Signs in the last 24 hours: Temp:  [97.6 F (36.4 C)-98.5 F (36.9 C)] 98.5 F (36.9 C) (08/26 0740) Pulse Rate:  [57-80] 77 (08/26 0600) Cardiac Rhythm: Normal sinus rhythm (08/26 0800) Resp:  [13-26] 19 (08/26 0800) BP: (111-140)/(34-95) 111/39 (08/26 0800) SpO2:  [78 %-100 %] 97 % (08/26 0925) FiO2 (%):  [40 %-50 %] 50 % (08/26 0925)  Physical Exam: BP Readings from Last 1 Encounters:  08/28/23 (!) 111/39     Wt Readings from Last 1 Encounters:  08/27/23 126.3 kg    Weight change:  Body mass index is 49.32 kg/m. HEENT: [place]/AT, Eyes-Brown, Conjunctiva-Pale pink, Sclera-Non-icteric Neck: No JVD, No bruit, Trachea midline. Lungs:  Clear, Bilateral. Cardiac:  Regular rhythm, normal S1 and S2, no S3. II/VI systolic murmur. Abdomen:  Soft, non-tender. BS present. Extremities:  Trace lower leg edema present. No cyanosis. No clubbing. CNS: AxOx2, Cranial nerves grossly intact, moves all 4 extremities.  Skin: Warm and dry.   Intake/Output from previous day: 08/25 0701 - 08/26 0700 In: 340.2 [P.O.:240; IV Piggyback:100.2] Out: 1075 [Urine:1075]    Lab Results: BMET    Component Value Date/Time   NA 144 08/28/2023 0335   NA 144 08/27/2023 0221   NA 143 08/26/2023 1401   NA 143 10/11/2022 1002   NA 145 (H) 09/28/2022 1053   NA 144 02/10/2021 1056   K 4.0 08/28/2023 0335   K 3.7 08/27/2023 0221   K 3.5 08/26/2023 1401   CL 95 (L) 08/28/2023 0335   CL 97 (L) 08/27/2023 0221   CL 97 (L) 08/26/2023 1217   CO2 36 (H) 08/28/2023 0335   CO2 35 (H) 08/27/2023 0221   CO2 35 (H) 08/26/2023 1217   GLUCOSE 127 (H) 08/28/2023 0335   GLUCOSE 186 (H) 08/27/2023 0221   GLUCOSE 183 (H) 08/26/2023 1217   BUN 70 (H) 08/28/2023 0335   BUN 54 (H) 08/27/2023 0221   BUN 45 (H) 08/26/2023 1217   BUN 50 (H) 10/11/2022 1002   BUN 63 (H)  09/28/2022 1053   BUN 25 02/10/2021 1056   CREATININE 1.84 (H) 08/28/2023 0335   CREATININE 1.48 (H) 08/27/2023 0221   CREATININE 1.49 (H) 08/26/2023 1822   CALCIUM  8.9 08/28/2023 0335   CALCIUM  8.5 (L) 08/27/2023 0221   CALCIUM  9.5 08/26/2023 1217   GFRNONAA 28 (L) 08/28/2023 0335   GFRNONAA 37 (L) 08/27/2023 0221   GFRNONAA 37 (L) 08/26/2023 1822   GFRAA 71 11/10/2019 1040   GFRAA 61 07/30/2019 1056   GFRAA >60 07/20/2019 0612   CBC    Component Value Date/Time   WBC 9.6 08/28/2023 0335   RBC 3.93 08/28/2023 0335   HGB 10.5 (L) 08/28/2023 0335   HCT 39.1 08/28/2023 0335   PLT 98 (L) 08/28/2023 0335   MCV 99.5 08/28/2023 0335   MCH 26.7 08/28/2023 0335   MCHC 26.9 (L) 08/28/2023 0335   RDW 17.2 (H) 08/28/2023 0335   LYMPHSABS 0.4 (L) 08/28/2023 0335   MONOABS 0.9 08/28/2023 0335   EOSABS 0.0 08/28/2023 0335   BASOSABS 0.0 08/28/2023 0335   HEPATIC Function Panel Recent Labs    08/26/23 1217 08/27/23 0221 08/28/23 0335  PROT 6.8 6.3* 6.5  ALBUMIN 4.1 3.2* 3.3*  AST 48* 29 24  ALT 33 29 27  ALKPHOS 57 48 42   HEMOGLOBIN A1C  Lab Results  Component Value Date   MPG 208.73 03/08/2020   CARDIAC ENZYMES Lab Results  Component Value Date   CKTOTAL 46 03/08/2020   TROPONINI <0.03 08/24/2015   TROPONINI 0.03 (HH) 08/24/2015   TROPONINI 0.03 (HH) 08/24/2015   BNP Recent Labs    08/16/23 1059 08/26/23 1217  PROBNP 1,497.0* 22,304.0*   TSH No results for input(s): TSH in the last 8760 hours. CHOLESTEROL Recent Labs    11/23/22 1056 02/27/23 1049 08/16/23 1059  CHOL 148 153 144    Scheduled Meds:  acidophilus  1 capsule Oral Q1500   aspirin  EC  81 mg Oral Daily   Chlorhexidine  Gluconate Cloth  6 each Topical Daily   disopyramide   100 mg Oral TID   doxycycline   100 mg Oral Q12H   enoxaparin  (LOVENOX ) injection  40 mg Subcutaneous Q24H   gabapentin   200 mg Oral Daily   insulin  aspart  0-15 Units Subcutaneous TID WC   insulin  glargine-yfgn  5  Units Subcutaneous QHS   ipratropium-albuterol   3 mL Inhalation Q6H   linagliptin   5 mg Oral Daily   methylPREDNISolone  (SOLU-MEDROL ) injection  40 mg Intravenous Daily   metoprolol  succinate  50 mg Oral Daily   montelukast   10 mg Oral QHS   multivitamin with minerals  1 tablet Oral Daily   mouth rinse  15 mL Mouth Rinse 4 times per day   rosuvastatin   10 mg Oral Daily   senna  1 tablet Oral BID   sertraline   150 mg Oral Daily   Continuous Infusions:  cefTRIAXone  (ROCEPHIN )  IV Stopped (08/27/23 2141)   PRN Meds:.acetaminophen  **OR** acetaminophen , albuterol , diclofenac  Sodium, guaiFENesin -dextromethorphan , hydrALAZINE , HYDROcodone -acetaminophen , LORazepam , melatonin, ondansetron  **OR** ondansetron  (ZOFRAN ) IV, mouth rinse, polyethylene glycol  Assessment/Plan: Acute on chronic hypoxemic/hypercapnic respiratory failure  COVID infection Hypertrophic obstructive cardiomyopathy COPD Type 2 DM CKD IV HLD Anxiety/depression Morbid obesity, Stage 3  Agree with holding lasix .   LOS: 2 days   Time spent including chart review, lab review, examination, discussion with patient/Nurse : 30 min   Salena Negri  MD  08/28/2023, 10:07 AM

## 2023-08-28 NOTE — Progress Notes (Addendum)
 NAME:  Morgan Fischer, MRN:  [id number], DOB:  April 14, 1950, LOS: 2 ADMISSION DATE:  08/26/2023, CONSULTATION DATE: 08/26/2023 REFERRING MD: Dr. Redia, CHIEF COMPLAINT: Acute on chronic hypoxemic/hypercarbic respiratory failure  History of Present Illness:  Patient with known history of chronic respiratory failure on BiPAP at night for hypercapnic respiratory failure, history of chronic diastolic congestive heart failure Transferred from med [facility] where she had presented with worsening shortness of breath of about 5 days duration She has had a cough, no sputum production, no chest pain or chest discomfort No fevers or chills No contact with anyone with a febrile illness No change in bowel habits, no dysuria  Recently followed up at [facility] last Wednesday and prior to that visit was feeling fine but she felt that there may have been people who was sick in the waiting area  Normally on 2 L of oxygen  during the day but in the last few weeks oxygen  levels were increased to 3 L because of desaturations  History of hypertrophic cardiomyopathy, chronic kidney disease stage IIIa, type 2 diabetes asthma/COPD, morbid obesity, usual activity tolerance about 20-30 steps, never smoker  Has been compliant with her usual medications  Tested positive for COVID but does not recollect being around anybody with acute febrile illness  Pertinent  Medical History   Past Medical History:  Diagnosis Date   Acute diastolic heart failure (HCC) 07/17/2019   Acute hypercapnic respiratory failure (HCC) 07/21/2016   Acute on chronic congestive heart failure (HCC) 07/19/2016   Acute on chronic diastolic CHF (congestive heart failure) (HCC) 03/08/2020   Acute on chronic respiratory failure with hypoxia (HCC) 10/20/2018   Acute on chronic respiratory failure with hypoxia and hypercapnia (HCC) 03/08/2020   Acute respiratory acidosis (HCC) 07/19/2016   Acute respiratory distress 07/19/2016   Acute respiratory  failure (HCC) 03/08/2020   Anxiety 03/15/2020   Aortic stenosis 11/01/2018   Arthritis    Asthma    Atherosclerosis of native coronary artery of native heart without angina pectoris 03/17/2016   Cardiomyopathy (HCC) 03/15/2020   Chest pain 07/14/2019   CHF (congestive heart failure) (HCC)    Chronic diastolic heart failure (HCC) 03/17/2016   Chronic hypoxemic respiratory failure (HCC)    Chronic kidney disease, stage 3b (HCC) 02/04/2021   CKD (chronic kidney disease)    COPD (chronic obstructive pulmonary disease) (HCC)    COPD with acute exacerbation (HCC) 03/08/2020   Diabetes mellitus without complication (HCC)    Drug allergy 07/21/2016   Essential (primary) hypertension 11/19/2016   Essential hypertension 11/19/2016   Gout 11/19/2016   Heart block AV complete (HCC)    High cholesterol    History of gout 04/05/2023   HOCM (hypertrophic obstructive cardiomyopathy) (HCC) 09/18/2017   Hyperlipidemia associated with type 2 diabetes mellitus (HCC) 11/23/2022   Hyperlipidemia, unspecified 03/15/2020   Hypertension    Hypertensive heart disease with heart failure (HCC) 03/17/2016   Hypertensive heart disease without congestive heart failure 03/15/2020   Hypertrophic cardiomyopathy (HCC) 04/12/2020   Hypokalemia 08/24/2015   Hypomagnesemia    Hypoxia 03/17/2016   Insomnia 11/23/2022   Iron deficiency 03/15/2020   Leukocytosis 07/19/2016   Long term (current) use of insulin  (HCC) 02/04/2021   Mild intermittent asthma 02/04/2021   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   Mixed hyperlipidemia 03/15/2020   Morbid obesity (HCC)    Morbid obesity with BMI of 60.0-69.9, adult (HCC) 07/21/2016   Need for influenza vaccination 11/23/2022   Need for pneumococcal 20-valent conjugate vaccination 11/23/2022  Nonrheumatic aortic valve stenosis 03/17/2016   Obesity hypoventilation syndrome (HCC) 03/08/2020   Obstructive sleep apnea 11/19/2016   Osteoarthritis of knee 03/15/2020   Panniculitis  08/23/2015   Peripheral venous insufficiency 03/15/2020   Pure hypercholesterolemia 02/13/2017   SOB (shortness of breath) 08/24/2015   Troponin level elevated 08/24/2015   Type 2 diabetes mellitus with hyperglycemia (HCC) 07/19/2016   Type 2 diabetes mellitus with stage 3 chronic kidney disease, with long-term current use of insulin  (HCC) 11/19/2016   Type 2 diabetes mellitus without complication, without long-term current use of insulin  (HCC) 04/12/2020   Type 2 diabetes mellitus without complications (HCC) 03/15/2020   Type 2 diabetes mellitus, without long-term current use of insulin  (HCC) 11/19/2016     Significant Hospital Events: Including procedures, antibiotic start and stop dates in addition to other pertinent events   8/24 chest x-ray with pulmonary vascular congestion 8/25 No acute issues overnight, wore BIPAP   Interim History / Subjective:   She has remained bipap dependent since last night She is awake and following commands. Discussed potential need for intubation. She expressed she wanted to stay with bipap as long as able and only move towards intubation if absolutely needed  Objective    Blood pressure (!) 100/29, pulse (!) 57, temperature 98.9 F (37.2 C), temperature source Axillary, resp. rate 20, height 5' 3 (1.6 m), weight 126.3 kg, SpO2 96%.    FiO2 (%):  [40 %-50 %] 50 % PEEP:  [8 cmH20-14 cmH20] 8 cmH20 Pressure Support:  [8 cmH20-16 cmH20] 16 cmH20   Intake/Output Summary (Last 24 hours) at 08/28/2023 1805 Last data filed at 08/28/2023 1200 Gross per 24 hour  Intake 640.2 ml  Output 600 ml  Net 40.2 ml   Filed Weights   08/26/23 1201 08/26/23 1700 08/27/23 0500  Weight: 134.6 kg 127.4 kg 126.3 kg    Examination: General: obese, acutely ill woman, bipap in place HEENT: [place]/AT, MM pink/moist, PERRL,  Neuro: Oriented x 3, nonfocal, intermittent jerking of extremities CV: s1s2 regular rate and rhythm, no murmur, rubs, or gallops,  PULM:  diminished on left, no wheezing GI: soft, bowel sounds active in all 4 quadrants, non-tender, non-distended, tolerating oral diet Extremities: warm/dry, generalized edema  Skin: no rashes or lesions  Resolved problem list   Assessment and Plan   Shock - Cardiogenic vs septic shock - check Coox, arterial line placed - start Levophed  for MAP goal 65 or greater - stop metoprolol   Acute on chronic hypoxemic/hypercapnic respiratory failure CAP Tested positive for COVID COPD with Exacerbation - continue bipap for hypercapnic resp failure - high risk for intubation given bipap dependence - Stop ceftriaxone  and add zosyn . Continue doxycycline  - continue duonebs and steroids  Hypertrophic Obstructive Cardiomyopathy - given diuresis today - stop metoprolol  - check coox   AKI on CKDIIIa - optimize MAP and cardiac function - monitor UOP and Cr  DMII  - ssi    Best Practice (right click and Reselect all SmartList Selections daily)   Diet/type: NPO DVT prophylaxis LMWH Pressure ulcer(s): N/A GI prophylaxis: N/A Lines: Central line Foley:  Yes, and it is still needed Code Status:  full code Last date of multidisciplinary goals of care discussion [Will discuss with patient and family]   Labs   CBC: Recent Labs  Lab 08/26/23 1217 08/26/23 1401 08/26/23 1822 08/27/23 0221 08/28/23 0335  WBC 8.5  --  6.6 5.8 9.6  NEUTROABS 6.7  --   --   --  8.3*  HGB  10.3* 10.2* 10.0* 9.5* 10.5*  HCT 35.2* 30.0* 36.4 34.2* 39.1  MCV 91.2  --  93.6 94.7 99.5  PLT 104*  --  97* 100* 98*    Basic Metabolic Panel: Recent Labs  Lab 08/26/23 1216 08/26/23 1217 08/26/23 1401 08/26/23 1822 08/27/23 0221 08/28/23 0335  NA 142 146* 143  --  144 144  K 3.2* 3.5 3.5  --  3.7 4.0  CL  --  97*  --   --  97* 95*  CO2  --  35*  --   --  35* 36*  GLUCOSE  --  183*  --   --  186* 127*  BUN  --  45*  --   --  54* 70*  CREATININE  --  1.50*  --  1.49* 1.48* 1.84*  CALCIUM   --  9.5  --    --  8.5* 8.9  MG  --   --   --   --   --  2.8*   GFR: Estimated Creatinine Clearance: 34.7 mL/min (A) (by C-G formula based on SCr of 1.84 mg/dL (H)). Recent Labs  Lab 08/26/23 1217 08/26/23 1822 08/27/23 0221 08/28/23 0335  PROCALCITON  --  0.92  --   --   WBC 8.5 6.6 5.8 9.6    Liver Function Tests: Recent Labs  Lab 08/26/23 1217 08/27/23 0221 08/28/23 0335  AST 48* 29 24  ALT 33 29 27  ALKPHOS 57 48 42  BILITOT 0.7 0.8 0.5  PROT 6.8 6.3* 6.5  ALBUMIN 4.1 3.2* 3.3*   No results for input(s): LIPASE, AMYLASE in the last 168 hours. No results for input(s): AMMONIA in the last 168 hours.  ABG    Component Value Date/Time   PHART 7.24 (L) 08/28/2023 1424   PCO2ART 100 (HH) 08/28/2023 1424   PO2ART 117 (H) 08/28/2023 1424   HCO3 42.9 (H) 08/28/2023 1424   TCO2 45 (H) 08/26/2023 1401   O2SAT 98.5 08/28/2023 1424     Coagulation Profile: No results for input(s): INR, PROTIME in the last 168 hours.  Cardiac Enzymes: No results for input(s): CKTOTAL, CKMB, CKMBINDEX, TROPONINI in the last 168 hours.  HbA1C: Hemoglobin A1C  Date/Time Value Ref Range Status  04/03/2023 12:00 AM 7.3  Final   Hgb A1c MFr Bld  Date/Time Value Ref Range Status  11/23/2022 10:56 AM 8.1 (H) 4.6 - 6.5 % Final    Comment:    Glycemic Control Guidelines for People with Diabetes:Non Diabetic:  <6%Goal of Therapy: <7%Additional Action Suggested:  >8%   03/08/2020 10:45 PM 8.9 (H) 4.8 - 5.6 % Final    Comment:    (NOTE) Pre diabetes:          5.7%-6.4%  Diabetes:              >6.4%  Glycemic control for   <7.0% adults with diabetes     CBG: Recent Labs  Lab 08/27/23 1807 08/27/23 2104 08/28/23 0748 08/28/23 1125 08/28/23 1632  GLUCAP 228* 192* 134* 202* 171*    Critical care time: 35 minutes   Dorn Chill, MD [facility] Office: [phone]   See Amion for personal pager PCCM on call pager [phone] until  7pm. Please call Elink 7p-7a. [phone]

## 2023-08-29 ENCOUNTER — Inpatient Hospital Stay (HOSPITAL_COMMUNITY)

## 2023-08-29 DIAGNOSIS — Z515 Encounter for palliative care: Secondary | ICD-10-CM

## 2023-08-29 DIAGNOSIS — U071 COVID-19: Secondary | ICD-10-CM | POA: Diagnosis not present

## 2023-08-29 DIAGNOSIS — J9621 Acute and chronic respiratory failure with hypoxia: Secondary | ICD-10-CM | POA: Diagnosis not present

## 2023-08-29 DIAGNOSIS — Z7189 Other specified counseling: Secondary | ICD-10-CM

## 2023-08-29 DIAGNOSIS — J9602 Acute respiratory failure with hypercapnia: Secondary | ICD-10-CM

## 2023-08-29 DIAGNOSIS — R918 Other nonspecific abnormal finding of lung field: Secondary | ICD-10-CM | POA: Diagnosis not present

## 2023-08-29 DIAGNOSIS — J9622 Acute and chronic respiratory failure with hypercapnia: Secondary | ICD-10-CM | POA: Diagnosis not present

## 2023-08-29 DIAGNOSIS — I5033 Acute on chronic diastolic (congestive) heart failure: Secondary | ICD-10-CM | POA: Diagnosis not present

## 2023-08-29 DIAGNOSIS — I700 Atherosclerosis of aorta: Secondary | ICD-10-CM | POA: Diagnosis not present

## 2023-08-29 DIAGNOSIS — R579 Shock, unspecified: Secondary | ICD-10-CM | POA: Diagnosis not present

## 2023-08-29 DIAGNOSIS — Z4682 Encounter for fitting and adjustment of non-vascular catheter: Secondary | ICD-10-CM | POA: Diagnosis not present

## 2023-08-29 LAB — CBC WITH DIFFERENTIAL/PLATELET
Abs Immature Granulocytes: 0.18 K/uL — ABNORMAL HIGH (ref 0.00–0.07)
Basophils Absolute: 0 K/uL (ref 0.0–0.1)
Basophils Relative: 0 %
Eosinophils Absolute: 0 K/uL (ref 0.0–0.5)
Eosinophils Relative: 0 %
HCT: 34.3 % — ABNORMAL LOW (ref 36.0–46.0)
Hemoglobin: 9.5 g/dL — ABNORMAL LOW (ref 12.0–15.0)
Immature Granulocytes: 1 %
Lymphocytes Relative: 3 %
Lymphs Abs: 0.4 K/uL — ABNORMAL LOW (ref 0.7–4.0)
MCH: 26.5 pg (ref 26.0–34.0)
MCHC: 27.7 g/dL — ABNORMAL LOW (ref 30.0–36.0)
MCV: 95.8 fL (ref 80.0–100.0)
Monocytes Absolute: 0.7 K/uL (ref 0.1–1.0)
Monocytes Relative: 5 %
Neutro Abs: 13.2 K/uL — ABNORMAL HIGH (ref 1.7–7.7)
Neutrophils Relative %: 91 %
Platelets: 100 K/uL — ABNORMAL LOW (ref 150–400)
RBC: 3.58 MIL/uL — ABNORMAL LOW (ref 3.87–5.11)
RDW: 17.2 % — ABNORMAL HIGH (ref 11.5–15.5)
WBC: 14.5 K/uL — ABNORMAL HIGH (ref 4.0–10.5)
nRBC: 0.3 % — ABNORMAL HIGH (ref 0.0–0.2)

## 2023-08-29 LAB — PHOSPHORUS: Phosphorus: 5.6 mg/dL — ABNORMAL HIGH (ref 2.5–4.6)

## 2023-08-29 LAB — BLOOD GAS, ARTERIAL
Acid-Base Excess: 11.5 mmol/L — ABNORMAL HIGH (ref 0.0–2.0)
Acid-Base Excess: 12.4 mmol/L — ABNORMAL HIGH (ref 0.0–2.0)
Bicarbonate: 42.3 mmol/L — ABNORMAL HIGH (ref 20.0–28.0)
Bicarbonate: 41.2 mmol/L — ABNORMAL HIGH (ref 20.0–28.0)
Delivery systems: POSITIVE
Drawn by: 31394
Drawn by: 20012
Expiratory PAP: 8 cmH2O
FIO2: 45 %
FIO2: 100 %
Inspiratory PAP: 24 cmH2O
MECHVT: 410 mL
O2 Content: 100 L/min
O2 Saturation: 96.4 %
O2 Saturation: 98.1 %
PEEP: 8 cmH2O
Patient temperature: 38.5
Patient temperature: 38.1
RATE: 24 {breaths}/min
RATE: 24 {breaths}/min
pCO2 arterial: 96 mmHg (ref 32–48)
pCO2 arterial: 77 mmHg (ref 32–48)
pH, Arterial: 7.26 — ABNORMAL LOW (ref 7.35–7.45)
pH, Arterial: 7.34 — ABNORMAL LOW (ref 7.35–7.45)
pO2, Arterial: 107 mmHg (ref 83–108)
pO2, Arterial: 235 mmHg — ABNORMAL HIGH (ref 83–108)

## 2023-08-29 LAB — COOXEMETRY PANEL
Carboxyhemoglobin: 1.7 % — ABNORMAL HIGH (ref 0.5–1.5)
Methemoglobin: <0.7 % (ref 0.0–1.5)
O2 Saturation: 77.4 %
Total hemoglobin: 10 g/dL — ABNORMAL LOW (ref 12.0–16.0)

## 2023-08-29 LAB — LACTATE DEHYDROGENASE: LDH: 254 U/L — ABNORMAL HIGH (ref 98–192)

## 2023-08-29 LAB — BASIC METABOLIC PANEL WITH GFR
Anion gap: 16 — ABNORMAL HIGH (ref 5–15)
Anion gap: 15 (ref 5–15)
BUN: 79 mg/dL — ABNORMAL HIGH (ref 8–23)
BUN: 77 mg/dL — ABNORMAL HIGH (ref 8–23)
CO2: 33 mmol/L — ABNORMAL HIGH (ref 22–32)
CO2: 31 mmol/L (ref 22–32)
Calcium: 9.1 mg/dL (ref 8.9–10.3)
Calcium: 8.5 mg/dL — ABNORMAL LOW (ref 8.9–10.3)
Chloride: 95 mmol/L — ABNORMAL LOW (ref 98–111)
Chloride: 98 mmol/L (ref 98–111)
Creatinine, Ser: 2.61 mg/dL — ABNORMAL HIGH (ref 0.44–1.00)
Creatinine, Ser: 2.23 mg/dL — ABNORMAL HIGH (ref 0.44–1.00)
GFR, Estimated: 19 mL/min — ABNORMAL LOW (ref >60)
GFR, Estimated: 22 mL/min — ABNORMAL LOW (ref >60)
Glucose, Bld: 124 mg/dL — ABNORMAL HIGH (ref 70–99)
Glucose, Bld: 205 mg/dL — ABNORMAL HIGH (ref 70–99)
Potassium: 4 mmol/L (ref 3.5–5.1)
Potassium: 3.7 mmol/L (ref 3.5–5.1)
Sodium: 145 mmol/L (ref 135–145)
Sodium: 144 mmol/L (ref 135–145)

## 2023-08-29 LAB — GLUCOSE, CAPILLARY
Glucose-Capillary: 124 mg/dL — ABNORMAL HIGH (ref 70–99)
Glucose-Capillary: 126 mg/dL — ABNORMAL HIGH (ref 70–99)
Glucose-Capillary: 181 mg/dL — ABNORMAL HIGH (ref 70–99)
Glucose-Capillary: 210 mg/dL — ABNORMAL HIGH (ref 70–99)
Glucose-Capillary: 216 mg/dL — ABNORMAL HIGH (ref 70–99)

## 2023-08-29 LAB — MAGNESIUM: Magnesium: 2.6 mg/dL — ABNORMAL HIGH (ref 1.7–2.4)

## 2023-08-29 LAB — PNEUMOCYSTIS JIROVECI SMEAR BY DFA: Pneumocystis jiroveci Ag: NEGATIVE

## 2023-08-29 LAB — C-REACTIVE PROTEIN: CRP: 24.1 mg/dL — ABNORMAL HIGH (ref <1.0)

## 2023-08-29 LAB — SEDIMENTATION RATE: Sed Rate: 71 mm/h — ABNORMAL HIGH (ref 0–22)

## 2023-08-29 MED ORDER — FENTANYL BOLUS VIA INFUSION
25.0000 ug | INTRAVENOUS | Status: DC | PRN
  Administered 2023-08-29: 100 ug via INTRAVENOUS
  Administered 2023-08-29: 50 ug via INTRAVENOUS
  Administered 2023-08-29: 75 ug via INTRAVENOUS
  Administered 2023-08-30 (×2): 100 ug via INTRAVENOUS
  Administered 2023-08-30: 50 ug via INTRAVENOUS
  Administered 2023-08-30: 100 ug via INTRAVENOUS
  Administered 2023-08-30: 25 ug via INTRAVENOUS
  Administered 2023-08-30 – 2023-08-31 (×3): 100 ug via INTRAVENOUS
  Administered 2023-08-31 (×2): 50 ug via INTRAVENOUS
  Administered 2023-08-31: 100 ug via INTRAVENOUS
  Administered 2023-09-02 – 2023-09-03 (×3): 50 ug via INTRAVENOUS
  Administered 2023-09-04 (×6): 100 ug via INTRAVENOUS
  Administered 2023-09-04: 50 ug via INTRAVENOUS
  Administered 2023-09-04 – 2023-09-10 (×38): 100 ug via INTRAVENOUS

## 2023-08-29 MED ORDER — ROSUVASTATIN CALCIUM 10 MG PO TABS
10.0000 mg | ORAL_TABLET | Freq: Every day | ORAL | Status: DC
  Administered 2023-08-30 – 2023-09-09 (×11): 10 mg
  Filled 2023-08-29 (×11): qty 1

## 2023-08-29 MED ORDER — KETAMINE HCL 50 MG/5ML IJ SOSY
PREFILLED_SYRINGE | INTRAMUSCULAR | Status: AC
  Filled 2023-08-29: qty 10

## 2023-08-29 MED ORDER — GUAIFENESIN-DM 100-10 MG/5ML PO SYRP
5.0000 mL | ORAL_SOLUTION | ORAL | Status: DC | PRN
Start: 2023-08-29 — End: 2023-08-29

## 2023-08-29 MED ORDER — MONTELUKAST SODIUM 10 MG PO TABS
10.0000 mg | ORAL_TABLET | Freq: Every day | ORAL | Status: DC
  Administered 2023-08-29 – 2023-09-09 (×12): 10 mg
  Filled 2023-08-29 (×12): qty 1

## 2023-08-29 MED ORDER — LACTATED RINGERS IV BOLUS
500.0000 mL | Freq: Once | INTRAVENOUS | Status: AC
  Administered 2023-08-29: 500 mL via INTRAVENOUS

## 2023-08-29 MED ORDER — ASPIRIN 81 MG PO CHEW
81.0000 mg | CHEWABLE_TABLET | Freq: Every day | ORAL | Status: DC
  Administered 2023-08-29 – 2023-09-09 (×12): 81 mg
  Filled 2023-08-29 (×12): qty 1

## 2023-08-29 MED ORDER — PROPOFOL 1000 MG/100ML IV EMUL
0.0000 ug/kg/min | INTRAVENOUS | Status: DC
  Administered 2023-08-29: 10 ug/kg/min via INTRAVENOUS
  Administered 2023-08-29: 5 ug/kg/min via INTRAVENOUS
  Administered 2023-08-30: 20 ug/kg/min via INTRAVENOUS
  Administered 2023-08-30 (×4): 30 ug/kg/min via INTRAVENOUS
  Administered 2023-08-31: 20 ug/kg/min via INTRAVENOUS
  Administered 2023-08-31: 30 ug/kg/min via INTRAVENOUS
  Administered 2023-08-31 – 2023-09-03 (×13): 20 ug/kg/min via INTRAVENOUS
  Administered 2023-09-04: 5 ug/kg/min via INTRAVENOUS
  Administered 2023-09-05: 15 ug/kg/min via INTRAVENOUS
  Filled 2023-08-29 (×22): qty 100

## 2023-08-29 MED ORDER — INSULIN GLARGINE-YFGN 100 UNIT/ML ~~LOC~~ SOLN
10.0000 [IU] | Freq: Every day | SUBCUTANEOUS | Status: DC
  Administered 2023-08-29 – 2023-09-01 (×4): 10 [IU] via SUBCUTANEOUS
  Filled 2023-08-29 (×5): qty 0.1

## 2023-08-29 MED ORDER — FENTANYL CITRATE (PF) 100 MCG/2ML IJ SOLN: 100.0000 ug | Freq: Once | INTRAMUSCULAR | Status: AC

## 2023-08-29 MED ORDER — ACETAMINOPHEN 325 MG PO TABS
650.0000 mg | ORAL_TABLET | Freq: Four times a day (QID) | ORAL | Status: DC | PRN
  Administered 2023-08-30 – 2023-09-08 (×9): 650 mg
  Filled 2023-08-29 (×10): qty 2

## 2023-08-29 MED ORDER — FENTANYL CITRATE (PF) 100 MCG/2ML IJ SOLN
INTRAMUSCULAR | Status: AC
Start: 2023-08-29 — End: 2023-08-29
  Administered 2023-08-29: 50 ug via INTRAVENOUS
  Filled 2023-08-29: qty 2

## 2023-08-29 MED ORDER — ADULT MULTIVITAMIN LIQUID CH
15.0000 mL | Freq: Every day | ORAL | Status: DC
  Administered 2023-08-30: 15 mL via ORAL
  Filled 2023-08-29 (×2): qty 15

## 2023-08-29 MED ORDER — DOXYCYCLINE MONOHYDRATE 25 MG/5ML PO SUSR
100.0000 mg | Freq: Two times a day (BID) | ORAL | Status: AC
  Administered 2023-08-29 – 2023-08-31 (×5): 100 mg
  Filled 2023-08-29 (×5): qty 20

## 2023-08-29 MED ORDER — MIDAZOLAM HCL 2 MG/2ML IJ SOLN: 2.0000 mg | Freq: Once | INTRAMUSCULAR | Status: AC

## 2023-08-29 MED ORDER — METHYLPREDNISOLONE SODIUM SUCC 40 MG IJ SOLR
40.0000 mg | Freq: Two times a day (BID) | INTRAMUSCULAR | Status: DC
  Administered 2023-08-29 – 2023-09-07 (×18): 40 mg via INTRAVENOUS
  Filled 2023-08-29 (×18): qty 1

## 2023-08-29 MED ORDER — ONDANSETRON HCL 4 MG PO TABS: 4.0000 mg | ORAL_TABLET | Freq: Four times a day (QID) | ORAL | Status: DC | PRN

## 2023-08-29 MED ORDER — ETOMIDATE 2 MG/ML IV SOLN: 10.0000 mg | Freq: Once | INTRAVENOUS | Status: AC

## 2023-08-29 MED ORDER — FENTANYL CITRATE PF 50 MCG/ML IJ SOSY
25.0000 ug | PREFILLED_SYRINGE | Freq: Once | INTRAMUSCULAR | Status: AC
  Administered 2023-08-29: 50 ug via INTRAVENOUS

## 2023-08-29 MED ORDER — INSULIN ASPART 100 UNIT/ML IJ SOLN
0.0000 [IU] | INTRAMUSCULAR | Status: DC
  Administered 2023-08-29 – 2023-08-30 (×5): 7 [IU] via SUBCUTANEOUS
  Administered 2023-08-30: 4 [IU] via SUBCUTANEOUS
  Administered 2023-08-30 – 2023-08-31 (×3): 7 [IU] via SUBCUTANEOUS
  Administered 2023-08-31: 4 [IU] via SUBCUTANEOUS
  Administered 2023-08-31 (×3): 7 [IU] via SUBCUTANEOUS
  Administered 2023-09-01: 4 [IU] via SUBCUTANEOUS
  Administered 2023-09-01: 11 [IU] via SUBCUTANEOUS
  Administered 2023-09-01: 4 [IU] via SUBCUTANEOUS
  Administered 2023-09-01: 7 [IU] via SUBCUTANEOUS
  Administered 2023-09-01: 3 [IU] via SUBCUTANEOUS
  Administered 2023-09-01: 4 [IU] via SUBCUTANEOUS
  Administered 2023-09-01 – 2023-09-02 (×4): 7 [IU] via SUBCUTANEOUS
  Administered 2023-09-02: 4 [IU] via SUBCUTANEOUS
  Administered 2023-09-02 (×2): 11 [IU] via SUBCUTANEOUS
  Administered 2023-09-03: 7 [IU] via SUBCUTANEOUS
  Administered 2023-09-03 (×3): 4 [IU] via SUBCUTANEOUS
  Administered 2023-09-03 (×2): 7 [IU] via SUBCUTANEOUS
  Administered 2023-09-04 (×2): 4 [IU] via SUBCUTANEOUS
  Administered 2023-09-04: 7 [IU] via SUBCUTANEOUS
  Administered 2023-09-04: 11 [IU] via SUBCUTANEOUS
  Administered 2023-09-04: 3 [IU] via SUBCUTANEOUS
  Administered 2023-09-04: 11 [IU] via SUBCUTANEOUS
  Administered 2023-09-05 (×2): 4 [IU] via SUBCUTANEOUS
  Administered 2023-09-05: 3 [IU] via SUBCUTANEOUS
  Administered 2023-09-05: 4 [IU] via SUBCUTANEOUS
  Administered 2023-09-05: 3 [IU] via SUBCUTANEOUS
  Administered 2023-09-05: 4 [IU] via SUBCUTANEOUS
  Administered 2023-09-06: 7 [IU] via SUBCUTANEOUS
  Administered 2023-09-06 (×2): 4 [IU] via SUBCUTANEOUS
  Administered 2023-09-06: 3 [IU] via SUBCUTANEOUS
  Administered 2023-09-06: 7 [IU] via SUBCUTANEOUS
  Administered 2023-09-07 (×2): 3 [IU] via SUBCUTANEOUS
  Administered 2023-09-07 (×2): 4 [IU] via SUBCUTANEOUS
  Administered 2023-09-07: 3 [IU] via SUBCUTANEOUS
  Administered 2023-09-08: 4 [IU] via SUBCUTANEOUS
  Administered 2023-09-08 (×2): 3 [IU] via SUBCUTANEOUS
  Administered 2023-09-08: 4 [IU] via SUBCUTANEOUS
  Administered 2023-09-09: 7 [IU] via SUBCUTANEOUS
  Administered 2023-09-09 (×2): 4 [IU] via SUBCUTANEOUS
  Administered 2023-09-09 – 2023-09-10 (×3): 7 [IU] via SUBCUTANEOUS

## 2023-08-29 MED ORDER — GABAPENTIN 250 MG/5ML PO SOLN
200.0000 mg | Freq: Every day | ORAL | Status: DC
  Administered 2023-08-29 – 2023-09-09 (×12): 200 mg
  Filled 2023-08-29 (×13): qty 4

## 2023-08-29 MED ORDER — POLYETHYLENE GLYCOL 3350 17 G PO PACK
17.0000 g | PACK | Freq: Every day | ORAL | Status: DC
  Administered 2023-08-29 – 2023-09-09 (×6): 17 g
  Filled 2023-08-29 (×8): qty 1

## 2023-08-29 MED ORDER — DOCUSATE SODIUM 50 MG/5ML PO LIQD
100.0000 mg | Freq: Two times a day (BID) | ORAL | Status: DC
  Administered 2023-08-29 – 2023-09-09 (×17): 100 mg
  Filled 2023-08-29 (×19): qty 10

## 2023-08-29 MED ORDER — FENTANYL CITRATE PF 50 MCG/ML IJ SOSY
PREFILLED_SYRINGE | INTRAMUSCULAR | Status: AC
  Filled 2023-08-29: qty 2

## 2023-08-29 MED ORDER — LINEZOLID 600 MG/300ML IV SOLN
600.0000 mg | Freq: Two times a day (BID) | INTRAVENOUS | Status: AC
  Administered 2023-08-29 – 2023-09-01 (×8): 600 mg via INTRAVENOUS
  Filled 2023-08-29 (×8): qty 300

## 2023-08-29 MED ORDER — MELATONIN 5 MG PO TABS: 5.0000 mg | ORAL_TABLET | Freq: Every evening | ORAL | Status: AC | PRN

## 2023-08-29 MED ORDER — ORAL CARE MOUTH RINSE: 15.0000 mL | OROMUCOSAL | Status: DC | PRN

## 2023-08-29 MED ORDER — SENNOSIDES 8.8 MG/5ML PO SYRP
5.0000 mL | ORAL_SOLUTION | Freq: Two times a day (BID) | ORAL | Status: DC
  Administered 2023-08-29 – 2023-08-31 (×5): 5 mL
  Filled 2023-08-29 (×5): qty 5

## 2023-08-29 MED ORDER — LACTATED RINGERS IV SOLN: INTRAVENOUS | Status: AC

## 2023-08-29 MED ORDER — LINAGLIPTIN 5 MG PO TABS
5.0000 mg | ORAL_TABLET | Freq: Every day | ORAL | Status: DC
  Administered 2023-08-30 – 2023-09-09 (×11): 5 mg
  Filled 2023-08-29 (×11): qty 1

## 2023-08-29 MED ORDER — ACETAMINOPHEN 650 MG RE SUPP: 650.0000 mg | Freq: Four times a day (QID) | RECTAL | Status: DC | PRN

## 2023-08-29 MED ORDER — PROSOURCE TF20 ENFIT COMPATIBL EN LIQD
60.0000 mL | Freq: Every day | ENTERAL | Status: DC
  Administered 2023-08-29 – 2023-09-09 (×12): 60 mL
  Filled 2023-08-29 (×12): qty 60

## 2023-08-29 MED ORDER — SUCCINYLCHOLINE CHLORIDE 200 MG/10ML IV SOSY
PREFILLED_SYRINGE | INTRAVENOUS | Status: AC
  Filled 2023-08-29: qty 10

## 2023-08-29 MED ORDER — SACCHAROMYCES BOULARDII 250 MG PO CAPS
250.0000 mg | ORAL_CAPSULE | Freq: Every day | ORAL | Status: DC
  Administered 2023-08-29 – 2023-09-09 (×12): 250 mg
  Filled 2023-08-29 (×12): qty 1

## 2023-08-29 MED ORDER — PROSOURCE TF20 ENFIT COMPATIBL EN LIQD: 60.0000 mL | Freq: Two times a day (BID) | ENTERAL | Status: DC

## 2023-08-29 MED ORDER — MIDODRINE HCL 5 MG PO TABS
10.0000 mg | ORAL_TABLET | Freq: Three times a day (TID) | ORAL | Status: DC
  Administered 2023-08-29 – 2023-09-09 (×33): 10 mg
  Filled 2023-08-29 (×33): qty 2

## 2023-08-29 MED ORDER — FAMOTIDINE 20 MG PO TABS: 20.0000 mg | ORAL_TABLET | Freq: Two times a day (BID) | ORAL | Status: DC

## 2023-08-29 MED ORDER — LORAZEPAM 0.5 MG PO TABS: 0.5000 mg | ORAL_TABLET | Freq: Four times a day (QID) | ORAL | Status: DC | PRN

## 2023-08-29 MED ORDER — PANTOPRAZOLE SODIUM 40 MG IV SOLR
40.0000 mg | Freq: Every day | INTRAVENOUS | Status: DC
  Administered 2023-08-29 – 2023-09-09 (×12): 40 mg via INTRAVENOUS
  Filled 2023-08-29 (×12): qty 10

## 2023-08-29 MED ORDER — VITAL HP 1.0 CAL PO LIQD: 1000.0000 mL | ORAL | Status: DC

## 2023-08-29 MED ORDER — VITAL AF 1.2 CAL PO LIQD
1000.0000 mL | ORAL | Status: DC
  Administered 2023-08-29 – 2023-09-09 (×12): 1000 mL

## 2023-08-29 MED ORDER — ONDANSETRON HCL 4 MG/2ML IJ SOLN: 4.0000 mg | Freq: Four times a day (QID) | INTRAMUSCULAR | Status: DC | PRN

## 2023-08-29 MED ORDER — ENOXAPARIN SODIUM 30 MG/0.3ML IJ SOSY
30.0000 mg | PREFILLED_SYRINGE | INTRAMUSCULAR | Status: DC
  Administered 2023-08-29: 30 mg via SUBCUTANEOUS
  Filled 2023-08-29: qty 0.3

## 2023-08-29 MED ORDER — FENTANYL 2500MCG IN NS 250ML (10MCG/ML) PREMIX INFUSION
0.0000 ug/h | INTRAVENOUS | Status: DC
  Administered 2023-08-29: 25 ug/h via INTRAVENOUS
  Administered 2023-08-30 – 2023-09-03 (×7): 150 ug/h via INTRAVENOUS
  Administered 2023-09-04: 100 ug/h via INTRAVENOUS
  Administered 2023-09-04: 125 ug/h via INTRAVENOUS
  Administered 2023-09-05: 200 ug/h via INTRAVENOUS
  Administered 2023-09-05: 150 ug/h via INTRAVENOUS
  Administered 2023-09-05: 300 ug/h via INTRAVENOUS
  Administered 2023-09-06 (×2): 350 ug/h via INTRAVENOUS
  Administered 2023-09-06: 275 ug/h via INTRAVENOUS
  Administered 2023-09-07 (×3): 400 ug/h via INTRAVENOUS
  Administered 2023-09-07: 375 ug/h via INTRAVENOUS
  Administered 2023-09-08: 40 ug/h via INTRAVENOUS
  Administered 2023-09-08 – 2023-09-09 (×6): 400 ug/h via INTRAVENOUS
  Filled 2023-08-29 (×29): qty 250

## 2023-08-29 MED ORDER — ORAL CARE MOUTH RINSE
15.0000 mL | OROMUCOSAL | Status: DC
  Administered 2023-08-29 – 2023-09-10 (×136): 15 mL via OROMUCOSAL

## 2023-08-29 MED ORDER — MIDAZOLAM HCL 2 MG/2ML IJ SOLN
INTRAMUSCULAR | Status: AC
  Administered 2023-08-29: 2 mg via INTRAVENOUS
  Filled 2023-08-29: qty 2

## 2023-08-29 MED ORDER — ROCURONIUM BROMIDE 10 MG/ML (PF) SYRINGE
PREFILLED_SYRINGE | INTRAVENOUS | Status: AC
  Administered 2023-08-29: 100 mg via INTRAVENOUS
  Filled 2023-08-29: qty 10

## 2023-08-29 MED ORDER — SERTRALINE HCL 100 MG PO TABS
150.0000 mg | ORAL_TABLET | Freq: Every day | ORAL | Status: DC
  Administered 2023-08-30 – 2023-09-09 (×11): 150 mg
  Filled 2023-08-29 (×11): qty 2

## 2023-08-29 MED ORDER — ROCURONIUM BROMIDE 10 MG/ML (PF) SYRINGE: 100.0000 mg | PREFILLED_SYRINGE | Freq: Once | INTRAVENOUS | Status: AC

## 2023-08-29 MED ORDER — HYDROCODONE-ACETAMINOPHEN 5-325 MG PO TABS: 1.0000 | ORAL_TABLET | Freq: Three times a day (TID) | ORAL | Status: DC | PRN

## 2023-08-29 MED ORDER — ETOMIDATE 2 MG/ML IV SOLN
INTRAVENOUS | Status: AC
  Administered 2023-08-29: 20 mg via INTRAVENOUS
  Filled 2023-08-29: qty 20

## 2023-08-29 MED ORDER — VITAL AF 1.2 CAL PO LIQD: 1000.0000 mL | ORAL | Status: DC

## 2023-08-29 MED ORDER — DISOPYRAMIDE PHOSPHATE 100 MG PO CAPS
100.0000 mg | ORAL_CAPSULE | Freq: Three times a day (TID) | ORAL | Status: DC
  Administered 2023-08-29 – 2023-09-09 (×34): 100 mg
  Filled 2023-08-29 (×42): qty 1

## 2023-08-29 NOTE — Progress Notes (Signed)
   08/29/23 0957  Vent Select  $ Ventilator Initial/Subsequent  (S)  Initial  $ Intubation  Yes  Invasive or Noninvasive Invasive  Adult Vent Y  Vent start date 08/29/23  Vent start time 0957  Airway 7.5 mm  Placement Date/Time: 08/29/23 0955   Placed By: ICU physician  Airway Device: Endotracheal Tube  ETT Types: Oral  Size (mm): 7.5 mm  Cuffed: Cuffed  Airway Equipment: Stylet  Placement Confirmation: Chest Rise;ETCO2 Detector;Bilateral Breath Sounds;Di...  Secured at (cm) (S)  22 cm  Measured From Teeth  Secured Location Center  Secured By English as a second language teacher No  Prone position No  Head position Left  Cuff Pressure (cm H2O) Green OR 18-26 CmH2O  Site Condition Dry  Adult IBW/VT Calculations  Height 5' 3 (1.6 m)  IBW/kg (Calculated) Female 56.9 kg  Low Range Vt 6cc/kg FEMALE 341.4 mL  Adult Moderate Range Vt 8cc/kg MA 455.2 mL  Adult High Range Vt 10cc/kg FEMALE 569 mL  IBW/kg (Calculated) FEMALE 52.4 kg  Low Range Vt 6cc/kg FEMALE 314.4 mL  Adult Moderate Range vt 8cc/kg FEMALE 419.2 mL  Adult High Range Vt 10cc/kg FEMALE 524 mL  Adult Ventilator Settings  Vent Type Servo i  Humidity HME  Vent Mode (S)  PRVC  Vt Set (S)  410 mL  Set Rate (S)  24 bmp  FiO2 (%) (S)  100 %  I Time 0.91 Sec(s)  PEEP (S)  8 cmH20  Adult Ventilator Measurements  Peak Airway Pressure 35 L/min  Mean Airway Pressure 15 cmH20  Plateau Pressure 27 cmH20  Resp Rate Spontaneous 0 br/min  Resp Rate Total 24 br/min  Exhaled Vt 407 mL  Measured Ve 9.4 L  I:E Ratio Measured 1:1.8  Total PEEP 8 cmH20  SpO2 100 %  Adult Ventilator Alarms  Alarms On Y  Ve High Alarm 26 L/min  Ve Low Alarm 5 L/min  Resp Rate High Alarm 36 br/min  Resp Rate Low Alarm 8  PEEP Low Alarm 2 cmH2O  Press High Alarm 40 cmH2O  T Apnea 20 sec(s)  VAP Prevention  HOB> 30 Degrees Y  Equipment wiped down Yes  Daily Weaning Assessment  Daily Assessment of Readiness to Wean Wean protocol criteria not met (New  intubation.)  Breath Sounds  Bilateral Breath Sounds Expiratory wheezes;Diminished  Vent Respiratory Assessment  Level of Consciousness Unresponsive (Sedated on vent.)  Suction Method  Respiratory Interventions (S)   (pt was bronched, no suctioning indicated at this time. RT sent sputum specimen to lab from bronch per MD order.)

## 2023-08-29 NOTE — Procedures (Signed)
 Intubation Procedure Note  Morgan Fischer  [id number]  [birth year]  Date:08/29/23  Time:3:35 PM   Provider Performing:Morgan Fischer    Procedure: Intubation (31500)  Indication(s) Respiratory Failure  Consent Risks of the procedure as well as the alternatives and risks of each were explained to the patient and/or caregiver.  Consent for the procedure was obtained and is signed in the bedside chart   Anesthesia Etomidate , Versed , Fentanyl , and Rocuronium    Time Out Verified patient identification, verified procedure, site/side was marked, verified correct patient position, special equipment/implants available, medications/allergies/relevant history reviewed, required imaging and test results available.   Sterile Technique Usual hand hygeine, masks, and gloves were used   Procedure Description Patient positioned in bed supine.  Sedation given as noted above.  Patient was intubated with endotracheal tube using Glidescope.  View was Grade 1 full glottis .  Number of attempts was 1.  Colorimetric CO2 detector was consistent with tracheal placement.   Complications/Tolerance None; patient tolerated the procedure well. Chest X-Morgan is ordered to verify placement.   EBL Minimal   Specimen(s) None

## 2023-08-29 NOTE — Progress Notes (Signed)
 eLink Physician-Brief Progress Note Patient Name: Morgan Fischer DOB: 1950/05/08 MRN: [id number]   Date of Service  08/29/2023  HPI/Events of Note  Patient with new onset anisocoria of unexplained etiology, unfortunately she is intubated, sedated and mechanically ventilated, making close neurological assessment difficult.  eICU Interventions  CT brain without contrast ordered.        Caroleena Paolini U Mabry Santarelli 08/29/2023, 7:57 PM

## 2023-08-29 NOTE — Progress Notes (Signed)
 OT Cancellation Note  Patient Details Name: Morgan Fischer MRN: [id number] DOB: [birth year]   Cancelled Treatment:    Reason Eval/Treat Not Completed: Medical issues which prohibited therapy (re-intubated). OT will continue to follow for re-evaluation when medically appropriate.  Leita PARAS Malicia Blasdel 08/29/2023, 11:59 AM  Leita DEL OTR/L Acute Rehabilitation Services Office: [phone]

## 2023-08-29 NOTE — TOC Initial Note (Signed)
 Transition of Care [facility] - Initial/Assessment Note    Patient Details  Name: Morgan Fischer MRN: [id number] Date of Birth: 1950/01/07  Transition [facility] CM/SW Contact:    Jon ONEIDA Anon, RN Phone Number: 08/29/2023, 3:40 PM  Clinical Narrative:                 Pt is from home. Currently intubated, on the ventilator. Palliative medicine consulted and following. TOC acknowledges PT/OT recommendation for SNF. Pt needing continued medical workup. Will send SNF referrals once pt is closer to medical readiness. TOC continuing to follow.      Expected Discharge Plan: Skilled Nursing Facility Barriers to Discharge: Continued Medical Work up   Patient Goals and CMS Choice Patient states their goals for this hospitalization and ongoing recovery are:: Pt currently intubated on ventilator, UTA CMS Medicare.gov Compare Post Acute Care list provided to:: Other (Comment Required) (NA) Choice offered to / list presented to : NA [place] ownership interest in [facility]provided to:: Parent NA    Expected Discharge Plan and Services In-house Referral: Hospice / Palliative Care Discharge Planning Services: CM Consult Post Acute Care Choice: Skilled Nursing Facility Living arrangements for the past 2 months: Single Family Home                 DME Arranged: N/A DME Agency: NA       HH Arranged: NA HH Agency: NA        Prior Living Arrangements/Services Living arrangements for the past 2 months: Single Family Home Lives with:: Self Patient language and need for interpreter reviewed:: Yes Do you feel safe going back to the place where you live?:  (UTA, pt on ventilator)      Need for Family Participation in Patient Care: Yes (Comment) Care giver support system in place?: Yes (comment) Current home services: DME Criminal Activity/Legal Involvement Pertinent to Current Situation/Hospitalization: No - Comment as needed  Activities of Daily Living   ADL Screening  (condition at time of admission) Independently performs ADLs?: No Does the patient have a NEW difficulty with bathing/dressing/toileting/self-feeding that is expected to last >3 days?: No Does the patient have a NEW difficulty with getting in/out of bed, walking, or climbing stairs that is expected to last >3 days?: No Does the patient have a NEW difficulty with communication that is expected to last >3 days?: No Is the patient deaf or have difficulty hearing?: Yes Does the patient have difficulty seeing, even when wearing glasses/contacts?: No Does the patient have difficulty concentrating, remembering, or making decisions?: No  Permission Sought/Granted Permission sought to share information with : Family Supports Permission granted to share information with : Yes, Verbal Permission Granted  Share Information with NAME: Levaeh, Vice (Daughter)  [phone]           Emotional Assessment Appearance:: Other (Comment Required (UTA)   Affect (typically observed): Unable to Assess Orientation: :  (UTA, pt currently on ventilator) Alcohol / Substance Use: Not Applicable Psych Involvement: No (comment)  Admission diagnosis:  Respiratory failure (HCC) [J96.90] Chronic pulmonary edema [J81.1] Respiratory distress [R06.03] COPD exacerbation (HCC) [J44.1] COVID-19 [U07.1] Acute on chronic respiratory failure with hypoxia and hypercapnia (HCC) [G03.78, J96.22] Patient Active Problem List   Diagnosis Date Noted   Palliative care encounter 08/29/2023   Counseling and coordination of care 08/29/2023   COVID-19 08/29/2023   Chronic pulmonary edema 08/28/2023   COPD exacerbation (HCC) 08/28/2023   Respiratory distress 08/27/2023   Respiratory failure (HCC) 08/26/2023  Depression with anxiety 07/10/2023   History of gout 04/05/2023   Need for pneumococcal 20-valent conjugate vaccination 11/23/2022   Need for influenza vaccination 11/23/2022   Insomnia 11/23/2022   Hyperlipidemia  associated with type 2 diabetes mellitus (HCC) 11/23/2022   Chronic kidney disease, stage 3b (HCC) 02/04/2021   Long term (current) use of insulin  (HCC) 02/04/2021   Mild intermittent asthma 02/04/2021   Hypertrophic cardiomyopathy (HCC) 04/12/2020   Type 2 diabetes mellitus without complication, without long-term current use of insulin  (HCC) 04/12/2020   Anxiety 03/15/2020   Cardiomyopathy (HCC) 03/15/2020   Iron deficiency 03/15/2020   Osteoarthritis of knee 03/15/2020   Peripheral venous insufficiency 03/15/2020   Hypertensive heart disease without congestive heart failure 03/15/2020   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   Hyperlipidemia, unspecified 03/15/2020   Mixed hyperlipidemia 03/15/2020   Type 2 diabetes mellitus without complications (HCC) 03/15/2020   Acute respiratory failure (HCC) 03/08/2020   COPD with acute exacerbation (HCC) 03/08/2020   Acute on chronic respiratory failure with hypoxia and hypercapnia (HCC) 03/08/2020   Acute on chronic diastolic CHF (congestive heart failure) (HCC) 03/08/2020   Obesity hypoventilation syndrome (HCC) 03/08/2020   Arthritis    Diabetes mellitus without complication (HCC)    High cholesterol    Acute diastolic heart failure (HCC) 07/17/2019   Chronic hypoxemic respiratory failure (HCC)    CKD (chronic kidney disease)    Chest pain 07/14/2019   Aortic stenosis 11/01/2018   Heart block AV complete (HCC)    Acute on chronic respiratory failure with hypoxia (HCC) 10/20/2018   HOCM (hypertrophic obstructive cardiomyopathy) (HCC) 09/18/2017   Pure hypercholesterolemia 02/13/2017   CHF (congestive heart failure) (HCC) 12/22/2016   Type 2 diabetes mellitus with stage 3 chronic kidney disease, with long-term current use of insulin  (HCC) 11/19/2016   Essential hypertension 11/19/2016   Gout 11/19/2016   Obstructive sleep apnea 11/19/2016   Essential (primary) hypertension 11/19/2016   Acute hypercapnic respiratory failure (HCC) 07/21/2016    Drug allergy 07/21/2016   Morbid obesity with BMI of 60.0-69.9, adult (HCC) 07/21/2016   Acute on chronic congestive heart failure (HCC) 07/19/2016   Acute respiratory acidosis (HCC) 07/19/2016   Acute respiratory distress 07/19/2016   Type 2 diabetes mellitus with hyperglycemia (HCC) 07/19/2016   Leukocytosis 07/19/2016   Chronic diastolic heart failure (HCC) 03/17/2016   Hypertensive heart disease with heart failure (HCC) 03/17/2016   Hypoxia 03/17/2016   Atherosclerosis of native coronary artery of native heart without angina pectoris 03/17/2016   Nonrheumatic aortic valve stenosis 03/17/2016   Troponin level elevated 08/24/2015   SOB (shortness of breath) 08/24/2015   Hypokalemia 08/24/2015   Morbid obesity (HCC)    Hypertension    Asthma    Hypomagnesemia    Panniculitis 08/23/2015   PCP:  Antonio Cyndee [name] [name], DO Pharmacy:   [facility] [phone] - [place], [place] - [address] AT [facility] [address] [place] [address] Phone: [phone] Fax: [phone]  [id number] - [place], [place] - [address] AT [address] [place]  [address] Phone: [phone] Fax: [phone]     Social Drivers of Health (SDOH) Social History: SDOH Screenings   Food Insecurity: No Food Insecurity (08/26/2023)  Housing: Low Risk  (08/26/2023)  Transportation Needs: No Transportation Needs (08/26/2023)  Utilities: Not At Risk (08/26/2023)  Alcohol Screen: Low Risk  (02/12/2023)  Depression (PHQ2-9): Low Risk  (02/13/2023)  Financial Resource Strain: Medium Risk (07/10/2023)  Physical Activity: Inactive (07/10/2023)  Social Connections: Moderately Integrated (08/26/2023)  Stress: Stress Concern Present (07/10/2023)  Tobacco Use: Low Risk  (08/26/2023)  Health Literacy: Adequate Health Literacy (02/13/2023)   SDOH Interventions:     Readmission Risk Interventions    08/29/2023    3:35 PM  Readmission  Risk Prevention Plan  Transportation Screening Complete  PCP or Specialist Appt within 3-5 Days Complete  HRI or Home Care Consult Complete  Social Work Consult for Recovery Care Planning/Counseling Complete  Palliative Care Screening Complete  Medication Review Oceanographer) Complete

## 2023-08-29 NOTE — Plan of Care (Signed)
  Problem: Education: Goal: Knowledge of risk factors and measures for prevention of condition will improve Outcome: Progressing   Problem: Coping: Goal: Psychosocial and spiritual needs will be supported Outcome: Progressing   Problem: Respiratory: Goal: Will maintain a patent airway Outcome: Progressing Goal: Complications related to the disease process, condition or treatment will be avoided or minimized Outcome: Progressing   Problem: Education: Goal: Knowledge of General Education information will improve Description: Including pain rating scale, medication(s)/side effects and non-pharmacologic comfort measures Outcome: Progressing   Problem: Health Behavior/Discharge Planning: Goal: Ability to manage health-related needs will improve Outcome: Progressing   Problem: Clinical Measurements: Goal: Ability to maintain clinical measurements within normal limits will improve Outcome: Progressing Goal: Will remain free from infection Outcome: Progressing Goal: Diagnostic test results will improve Outcome: Progressing Goal: Respiratory complications will improve Outcome: Progressing Goal: Cardiovascular complication will be avoided Outcome: Progressing   Problem: Activity: Goal: Risk for activity intolerance will decrease Outcome: Progressing   Problem: Nutrition: Goal: Adequate nutrition will be maintained Outcome: Progressing   Problem: Coping: Goal: Level of anxiety will decrease Outcome: Progressing   Problem: Elimination: Goal: Will not experience complications related to bowel motility Outcome: Progressing Goal: Will not experience complications related to urinary retention Outcome: Progressing   Problem: Pain Managment: Goal: General experience of comfort will improve and/or be controlled Outcome: Progressing   Problem: Safety: Goal: Ability to remain free from injury will improve Outcome: Progressing   Problem: Skin Integrity: Goal: Risk for impaired  skin integrity will decrease Outcome: Progressing   Problem: Education: Goal: Ability to describe self-care measures that may prevent or decrease complications (Diabetes Survival Skills Education) will improve Outcome: Progressing Goal: Individualized Educational Video(s) Outcome: Progressing   Problem: Coping: Goal: Ability to adjust to condition or change in health will improve Outcome: Progressing   Problem: Fluid Volume: Goal: Ability to maintain a balanced intake and output will improve Outcome: Progressing   Problem: Health Behavior/Discharge Planning: Goal: Ability to identify and utilize available resources and services will improve Outcome: Progressing Goal: Ability to manage health-related needs will improve Outcome: Progressing   Problem: Metabolic: Goal: Ability to maintain appropriate glucose levels will improve Outcome: Progressing   Problem: Nutritional: Goal: Maintenance of adequate nutrition will improve Outcome: Progressing Goal: Progress toward achieving an optimal weight will improve Outcome: Progressing   Problem: Skin Integrity: Goal: Risk for impaired skin integrity will decrease Outcome: Progressing   Problem: Tissue Perfusion: Goal: Adequacy of tissue perfusion will improve Outcome: Progressing

## 2023-08-29 NOTE — Progress Notes (Signed)
 Initial Nutrition Assessment  DOCUMENTATION CODES:   Morbid obesity  INTERVENTION:  - Initiate tube feeding via OGT: Vital 1.2 at 55 ml/h (1320 ml per day) *Start @ 35mL/hr and advance by 10mL Q8H Prosource TF20 60 ml daily Provides 1664 kcal, 119 gm protein, 1070 ml free water daily  - FWF per CCM.   NUTRITION DIAGNOSIS:   Inadequate oral intake related to inability to eat as evidenced by NPO status.  GOAL:   Patient will meet greater than or equal to 90% of their needs  MONITOR:   Vent status, Labs, TF tolerance  REASON FOR ASSESSMENT:   Consult Enteral/tube feeding initiation and management  ASSESSMENT:   73 y.o. female with PMH chronic hypoxemic and hypercapnic respiratory failure on daytime oxygen  2 L and nighttime BiPAP, chronic diastolic congestive heart failure, COPD who is admitted with about 5 days of shortness of breath, cough. Found to be Covid+.   8./24 Admit 8/27 Intubated  Patient is currently intubated on ventilator support MV: 9.4 L/min Temp (24hrs), Avg:100.8 F (38.2 C), Min:98.9 F (37.2 C), Max:102 F (38.9 C)  No family at bedside. Per chart review, weight has fluctuated between 278#-302# over the past year.   OGT placed this AM, xray verified in the stomach.  Per discussion with CCM, can initiate tube feeds today.  Will plant to utilize Caylin Raby guidelines for morbidly obese given patient's significant respiratory illness history (chronic resp failure and COPD).   Medications reviewed and include: Colace, Miralax , Senokot, MVI Fentanyl  Levo @ 4 mcg/min Propofol  @ 15.31 mL/hr (provides 404 kcals over 24 hours)  Labs reviewed:  Creatinine 2.61 HA1C 7.3 (as of 04/03/23) Blood Glucose 124-202 x24 hours  NUTRITION - FOCUSED PHYSICAL EXAM:  Flowsheet Row Most Recent Value  Orbital Region No depletion  Upper Arm Region No depletion  Thoracic and Lumbar Region No depletion  Buccal Region No depletion  Temple Region No depletion   Clavicle Bone Region No depletion  Clavicle and Acromion Bone Region No depletion  Scapular Bone Region Unable to assess  Dorsal Hand No depletion  Patellar Region No depletion  Anterior Thigh Region No depletion  Posterior Calf Region No depletion  Edema (RD Assessment) Mild  Hair Reviewed  Eyes Unable to assess  Mouth Unable to assess  Skin Reviewed  Nails Reviewed    Diet Order:   Diet Order             Diet NPO time specified  Diet effective now                   EDUCATION NEEDS:  Not appropriate for education at this time  Skin:  Skin Assessment: Reviewed RN Assessment  Last BM:  8/23  Height:  Ht Readings from Last 1 Encounters:  08/29/23 5' 3 (1.6 m)   Weight:  Wt Readings from Last 1 Encounters:  08/29/23 127.6 kg   Ideal Body Weight:  52.27 kg  BMI:  Body mass index is 49.83 kg/m.  Estimated Nutritional Needs:  Kcal:  1400-1750 kcals Protein:  105-130 grams Fluid:  >/= 1.5L    Trude Ned RD, LDN Contact via Secure Chat.

## 2023-08-29 NOTE — Consult Note (Signed)
 Consultation Note Date: 08/29/2023   Patient Name: Morgan Fischer  DOB: [birth year]  MRN: [id number]  Age / Sex: 73 y.o., female   PCP: Antonio Cyndee [name] [name], DO Referring Physician: Kara Dorn NOVAK, MD  Reason for Consultation: Establishing goals of care     Chief Complaint/History of Present Illness:   Patient is a 73 year old female with a past medical history of chronic hypoxic and hypercapnic respiratory failure on 2 L nasal cannula and daytime and nighttime BiPAP, HOCM, HFpEF, COPD, diabetes mellitus type 2, hyperlipidemia, and CKD who was admitted on 08/26/2023 for management of shortness of breath and cough.  During hospitalization, patient required BiPAP support for acute on chronic hypoxic and hypercapnic respiratory failure in setting of COVID-19 infection diagnosis and heart failure and COPD exacerbation.  Patient was transferred to stepdown unit on 08/28/2023 due to worsening respiratory status.  Palliative medicine team consulted to assist with complex medical decision making.  Reviewed EMR including recent documentation from hospitalist and PCCM providers.  Review of recent BMP noting BUN elevated to 79 and creatinine elevated to 2.61 so GFR 19.  CBC also noted with leukocytosis with WBC elevated at 14.5 and thrombocytopenia of 100. Upon review of EMR, noted that patient was intubated today for airway protection.  Patient required vasopressor support overnight.  Discussed care with PCCM providers to coordinate care.  They noted they had already spoken with patient's daughter, Morgan Fischer, who expressed wishes for the patient to be intubated and to receive aggressive medical interventions at this time.  They noted that his daughter is also sick likely with COVID and will be needing medical evaluation as well.  Discussed involvement of PMT moving forward while currently continuing aggressive medical interventions to allow time for outcomes.  When presenting to bedside to see patient,  patient is intubated and sedated.  Discussed care with RN for medical updates.  Primary Diagnoses  Present on Admission:  Respiratory failure (HCC)  Morbid obesity (HCC)  Hypertension  Chronic diastolic heart failure (HCC)  Acute on chronic respiratory failure with hypoxia (HCC)  HOCM (hypertrophic obstructive cardiomyopathy) (HCC)  Acute on chronic diastolic CHF (congestive heart failure) (HCC)  Acute on chronic respiratory failure with hypoxia and hypercapnia (HCC)   Past Medical History:  Diagnosis Date   Acute diastolic heart failure (HCC) 07/17/2019   Acute hypercapnic respiratory failure (HCC) 07/21/2016   Acute on chronic congestive heart failure (HCC) 07/19/2016   Acute on chronic diastolic CHF (congestive heart failure) (HCC) 03/08/2020   Acute on chronic respiratory failure with hypoxia (HCC) 10/20/2018   Acute on chronic respiratory failure with hypoxia and hypercapnia (HCC) 03/08/2020   Acute respiratory acidosis (HCC) 07/19/2016   Acute respiratory distress 07/19/2016   Acute respiratory failure (HCC) 03/08/2020   Anxiety 03/15/2020   Aortic stenosis 11/01/2018   Arthritis    Asthma    Atherosclerosis of native coronary artery of native heart without angina pectoris 03/17/2016   Cardiomyopathy (HCC) 03/15/2020   Chest pain 07/14/2019   CHF (congestive heart failure) (HCC)    Chronic diastolic heart failure (HCC) 03/17/2016   Chronic hypoxemic respiratory failure (HCC)    Chronic kidney disease, stage 3b (HCC) 02/04/2021   CKD (chronic kidney disease)    COPD (chronic obstructive pulmonary disease) (HCC)    COPD with acute exacerbation (HCC) 03/08/2020   Diabetes mellitus without complication (HCC)    Drug allergy 07/21/2016   Essential (primary) hypertension 11/19/2016   Essential hypertension 11/19/2016   Gout 11/19/2016   Heart block  AV complete (HCC)    High cholesterol    History of gout 04/05/2023   HOCM (hypertrophic obstructive cardiomyopathy) (HCC)  09/18/2017   Hyperlipidemia associated with type 2 diabetes mellitus (HCC) 11/23/2022   Hyperlipidemia, unspecified 03/15/2020   Hypertension    Hypertensive heart disease with heart failure (HCC) 03/17/2016   Hypertensive heart disease without congestive heart failure 03/15/2020   Hypertrophic cardiomyopathy (HCC) 04/12/2020   Hypokalemia 08/24/2015   Hypomagnesemia    Hypoxia 03/17/2016   Insomnia 11/23/2022   Iron deficiency 03/15/2020   Leukocytosis 07/19/2016   Long term (current) use of insulin  (HCC) 02/04/2021   Mild intermittent asthma 02/04/2021   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   Mixed hyperlipidemia 03/15/2020   Morbid obesity (HCC)    Morbid obesity with BMI of 60.0-69.9, adult (HCC) 07/21/2016   Need for influenza vaccination 11/23/2022   Need for pneumococcal 20-valent conjugate vaccination 11/23/2022   Nonrheumatic aortic valve stenosis 03/17/2016   Obesity hypoventilation syndrome (HCC) 03/08/2020   Obstructive sleep apnea 11/19/2016   Osteoarthritis of knee 03/15/2020   Panniculitis 08/23/2015   Peripheral venous insufficiency 03/15/2020   Pure hypercholesterolemia 02/13/2017   SOB (shortness of breath) 08/24/2015   Troponin level elevated 08/24/2015   Type 2 diabetes mellitus with hyperglycemia (HCC) 07/19/2016   Type 2 diabetes mellitus with stage 3 chronic kidney disease, with long-term current use of insulin  (HCC) 11/19/2016   Type 2 diabetes mellitus without complication, without long-term current use of insulin  (HCC) 04/12/2020   Type 2 diabetes mellitus without complications (HCC) 03/15/2020   Type 2 diabetes mellitus, without long-term current use of insulin  (HCC) 11/19/2016   Social History   Socioeconomic History   Marital status: Widowed    Spouse name: Not on file   Number of children: Not on file   Years of education: Not on file   Highest education level: Bachelor's degree (e.g., BA, AB, BS)  Occupational History   Occupation: retired     Comment: Copywriter, [employer]  Tobacco Use   Smoking status: Never   Smokeless tobacco: Never  Vaping Use   Vaping status: Never Used  Substance and Sexual Activity   Alcohol use: No   Drug use: No   Sexual activity: Never  Other Topics Concern   Not on file  Social History Narrative   Not on file   Social Drivers of Health   Financial Resource Strain: Medium Risk (07/10/2023)   Overall Financial Resource Strain (CARDIA)    Difficulty of Paying Living Expenses: Somewhat hard  Food Insecurity: No Food Insecurity (08/26/2023)   Hunger Vital Sign    Worried About Running Out of Food in the Last Year: Never true    Ran Out of Food in the Last Year: Never true  Transportation Needs: No Transportation Needs (08/26/2023)   PRAPARE - Administrator, Civil Service (Medical): No    Lack of Transportation (Non-Medical): No  Physical Activity: Inactive (07/10/2023)   Exercise Vital Sign    Days of Exercise per Week: 0 days    Minutes of Exercise per Session: Not on file  Stress: Stress Concern Present (07/10/2023)   [employer] of Occupational Health - Occupational Stress Questionnaire    Feeling of Stress: To some extent  Social Connections: Moderately Integrated (08/26/2023)   Social Connection and Isolation Panel    Frequency of Communication with Friends and Family: More than three times a week    Frequency of Social Gatherings with Friends  and Family: Once a week    Attends Religious Services: More than 4 times per year    Active Member of Clubs or Organizations: Yes    Attends Banker Meetings: More than 4 times per year    Marital Status: Widowed   Family History  Problem Relation Age of Onset   Diabetes Mellitus I Mother    CAD Mother    Diabetes Mother    Heart disease Father    Cancer Father    Breast cancer Sister    Cancer Sister    Heart disease Sister    Prostate cancer Brother    Scheduled Meds:   acidophilus  1 capsule Oral Q1500   aspirin  EC  81 mg Oral Daily   Chlorhexidine  Gluconate Cloth  6 each Topical Daily   disopyramide   100 mg Oral TID   docusate  100 mg Per Tube BID   doxycycline   100 mg Oral Q12H   enoxaparin  (LOVENOX ) injection  30 mg Subcutaneous Q24H   fentaNYL  (SUBLIMAZE ) injection  25-50 mcg Intravenous Once   gabapentin   200 mg Oral Daily   insulin  aspart  0-15 Units Subcutaneous TID WC   insulin  glargine-yfgn  5 Units Subcutaneous QHS   ipratropium-albuterol   3 mL Inhalation Q6H   linagliptin   5 mg Oral Daily   methylPREDNISolone  (SOLU-MEDROL ) injection  40 mg Intravenous Daily   midodrine   10 mg Oral TID WC   montelukast   10 mg Oral QHS   multivitamin with minerals  1 tablet Oral Daily   mouth rinse  15 mL Mouth Rinse Q2H   pantoprazole  (PROTONIX ) IV  40 mg Intravenous Daily   polyethylene glycol  17 g Per Tube Daily   rosuvastatin   10 mg Oral Daily   senna  1 tablet Oral BID   sertraline   150 mg Oral Daily   sodium chloride  flush  10-40 mL Intracatheter Q12H   Continuous Infusions:  sodium chloride      sodium chloride  Stopped (08/29/23 0410)   fentaNYL  infusion INTRAVENOUS 25 mcg/hr (08/29/23 1033)   lactated ringers      linezolid  (ZYVOX ) IV     norepinephrine  (LEVOPHED ) Adult infusion 9 mcg/min (08/29/23 0700)   piperacillin -tazobactam (ZOSYN )  IV 12.5 mL/hr at 08/29/23 0700   propofol  (DIPRIVAN ) infusion 5 mcg/kg/min (08/29/23 1034)   PRN Meds:.Place/Maintain arterial line **AND** sodium chloride , sodium chloride , acetaminophen  **OR** acetaminophen , albuterol , diclofenac  Sodium, fentaNYL , guaiFENesin -dextromethorphan , HYDROcodone -acetaminophen , LORazepam , melatonin, ondansetron  **OR** ondansetron  (ZOFRAN ) IV, mouth rinse, sodium chloride  flush Allergies  Allergen Reactions   Atorvastatin Anaphylaxis and Swelling    Lip swelling    Ciprofloxacin Anaphylaxis    Kidney failure   Ciprocinonide [Fluocinolone] Other (See Comments)    Kidney failure    Catapres [Clonidine Hcl] Rash   Clonidine Rash   Demadex [Torsemide] Rash   CBC:    Component Value Date/Time   WBC 14.5 (H) 08/29/2023 1008   HGB 9.5 (L) 08/29/2023 1008   HCT 34.3 (L) 08/29/2023 1008   PLT 100 (L) 08/29/2023 1008   MCV 95.8 08/29/2023 1008   NEUTROABS 13.2 (H) 08/29/2023 1008   LYMPHSABS 0.4 (L) 08/29/2023 1008   MONOABS 0.7 08/29/2023 1008   EOSABS 0.0 08/29/2023 1008   BASOSABS 0.0 08/29/2023 1008   Comprehensive Metabolic Panel:    Component Value Date/Time   NA 145 08/29/2023 0412   NA 143 10/11/2022 1002   K 4.0 08/29/2023 0412   CL 95 (L) 08/29/2023 0412   CO2 33 (H) 08/29/2023 9587  BUN 79 (H) 08/29/2023 0412   BUN 50 (H) 10/11/2022 1002   CREATININE 2.61 (H) 08/29/2023 0412   GLUCOSE 124 (H) 08/29/2023 0412   CALCIUM  9.1 08/29/2023 0412   AST 24 08/28/2023 0335   ALT 27 08/28/2023 0335   ALKPHOS 42 08/28/2023 0335   BILITOT 0.5 08/28/2023 0335   BILITOT <0.2 09/28/2022 1053   PROT 6.5 08/28/2023 0335   PROT 6.5 09/28/2022 1053   ALBUMIN 3.3 (L) 08/28/2023 0335   ALBUMIN 4.1 09/28/2022 1053    Physical Exam: Vital Signs: BP (!) 100/29 (BP Location: Right Arm)   Pulse 72   Temp (!) 101.3 F (38.5 C) (Bladder)   Resp (!) 24   Ht 5' 3 (1.6 m)   Wt 127.6 kg   LMP  (LMP Unknown)   SpO2 100%   BMI 49.83 kg/m  SpO2: SpO2: 100 % O2 Device: O2 Device: Bi-PAP O2 Flow Rate: O2 Flow Rate (L/min): 6 L/min Intake/output summary:  Intake/Output Summary (Last 24 hours) at 08/29/2023 1047 Last data filed at 08/29/2023 0815 Gross per 24 hour  Intake 552.54 ml  Output 1075 ml  Net -522.46 ml   LBM: Last BM Date : 08/25/23 Baseline Weight: Weight: 134.6 kg Most recent weight: Weight: 127.6 kg  General: Sedated on ventilator support Cardiovascular: Bradycardia noted Respiratory: Intubated on ventilator support  Neuro: Sedated          Palliative Performance Scale: 10%              Additional Data Reviewed: Recent Labs     08/28/23 0335 08/29/23 0412 08/29/23 1008  WBC 9.6  --  14.5*  HGB 10.5*  --  9.5*  PLT 98*  --  100*  NA 144 145  --   BUN 70* 79*  --   CREATININE 1.84* 2.61*  --     Imaging: DG CHEST PORT 1 VIEW CLINICAL DATA:  Follow-up pneumonia.  EXAM: PORTABLE CHEST 1 VIEW  COMPARISON:  08/26/2023.  Chest CT dated 08/27/2023.  FINDINGS: Stable enlarged cardiac silhouette. Mildly progressive bilateral patchy densities, especially throughout the right lung. No visible pleural fluid. Tortuous and partially calcified thoracic aorta. Bilateral shoulder degenerative changes.  IMPRESSION: 1. Mildly progressive bilateral pneumonia, especially throughout the right lung. 2. Stable cardiomegaly.  Electronically Signed   By: Elspeth Bathe M.D.   On: 08/28/2023 18:02 US  EKG SITE RITE If Site Rite image not attached, placement could not be confirmed due to  current cardiac rhythm.    I personally reviewed recent imaging.   Palliative Care Assessment and Plan Summary of Established Goals of Care and Medical Treatment Preferences   Patient is a 73 year old female with a past medical history of chronic hypoxic and hypercapnic respiratory failure on 2 L nasal cannula and daytime and nighttime BiPAP, HOCM, HFpEF, COPD, diabetes mellitus type 2, hyperlipidemia, and CKD who was admitted on 08/26/2023 for management of shortness of breath and cough.  During hospitalization, patient required BiPAP support for acute on chronic hypoxic and hypercapnic respiratory failure in setting of COVID-19 infection diagnosis and heart failure and COPD exacerbation.  Patient was transferred to stepdown unit on 08/28/2023 due to worsening respiratory status.  Palliative medicine team consulted to assist with complex medical decision making.  # Complex medical decision making/goals of care  - Unable to discuss care planning with patient due to medical condition.  Discussed care with PCCM providers to coordinate care.   PCCM providers had already discussed medical planning with patient's daughter  today who had expressed wishes for aggressive medical interventions at this time including intubation.  Noted daughter also likely ill and will be seeking medical care herself.  At this time continuing aggressive medical interventions to allow time for outcomes.  Noted palliative medicine team would continue to follow along to engage in conversations as able and appropriate.  -  Code Status: Full Code   # Psycho-social/Spiritual Support:  - Support System: Daughter/NOK in EMR- Morgan Fischer  # Discharge Planning:  To Be Determined  Thank you for allowing the palliative care team to participate in the care Morgan Fischer.  Tinnie Radar, DO Palliative Care Provider PMT # [phone]  If patient remains symptomatic despite maximum doses, please call PMT at [phone] between 0700 and 1900. Outside of these hours, please call attending, as PMT does not have night coverage.  Personally spent 55 minutes in patient care including extensive chart review (labs, imaging, progress/consult notes, vital signs), medically appropraite exam, discussed with treatment team, education to patient, family, and staff, documenting clinical information, medication review and management, coordination of care, and available advanced directive documents.

## 2023-08-29 NOTE — Progress Notes (Signed)
 Patient transported to CT scan and back to room 1235.  Patient remained stable and comfortable on the ventilator throughout the trip.

## 2023-08-29 NOTE — Procedures (Signed)
 Bronchoscopy Procedure Note  Morgan Fischer  [id number]  02-13-1950  Date:08/29/23  Time:3:36 PM   Provider Performing:Bolton Canupp B Lorence Nagengast   Procedure(s):  Flexible Bronchoscopy (68377)  Indication(s) Pneumonia  Consent Unable to obtain consent due to emergent nature of procedure.  Anesthesia Sedated after intubation   Time Out Verified patient identification, verified procedure, site/side was marked, verified correct patient position, special equipment/implants available, medications/allergies/relevant history reviewed, required imaging and test results available.   Sterile Technique Usual hand hygiene, masks, gowns, and gloves were used   Procedure Description Bronchoscope advanced through endotracheal tube and into airway.  Airways were examined down to subsegmental level with findings noted below.   Following diagnostic evaluation, BAL(s) performed in RUL with normal saline and return of 25mL fluid  Findings:  - purulent secretions throughout left and right bronchial tree but greatest in RUL - normal appearing airways once secretions suctioned to clarity   Complications/Tolerance None; patient tolerated the procedure well. Chest X-ray is not needed post procedure.   EBL Minimal   Specimen(s) RUL BAL sent for respiratory cultures and pneumocystis smear

## 2023-08-29 NOTE — Progress Notes (Signed)
 NAME:  Morgan Fischer, MRN:  [id number], DOB:  Sep 14, 1950, LOS: 3 ADMISSION DATE:  08/26/2023, CONSULTATION DATE: 08/26/2023 REFERRING MD: Redia - TRH, CHIEF COMPLAINT: Acute on chronic hypoxemic/hypercarbic respiratory failure  History of Present Illness:  Patient with known history of chronic respiratory failure on BiPAP at night for hypercapnic respiratory failure, history of chronic diastolic congestive heart failure Transferred from med [facility] where she had presented with worsening shortness of breath of about 5 days duration She has had a cough, no sputum production, no chest pain or chest discomfort No fevers or chills No contact with anyone with a febrile illness No change in bowel habits, no dysuria  Recently followed up at [facility] last Wednesday and prior to that visit was feeling fine but she felt that there may have been people who was sick in the waiting area  Normally on 2 L of oxygen  during the day but in the last few weeks oxygen  levels were increased to 3 L because of desaturations  History of hypertrophic cardiomyopathy, chronic kidney disease stage IIIa, type 2 diabetes asthma/COPD, morbid obesity, usual activity tolerance about 20-30 steps, never smoker  Has been compliant with her usual medications  Tested positive for COVID but does not recollect being around anybody with acute febrile illness  Pertinent Medical History:   Past Medical History:  Diagnosis Date   Acute diastolic heart failure (HCC) 07/17/2019   Acute hypercapnic respiratory failure (HCC) 07/21/2016   Acute on chronic congestive heart failure (HCC) 07/19/2016   Acute on chronic diastolic CHF (congestive heart failure) (HCC) 03/08/2020   Acute on chronic respiratory failure with hypoxia (HCC) 10/20/2018   Acute on chronic respiratory failure with hypoxia and hypercapnia (HCC) 03/08/2020   Acute respiratory acidosis (HCC) 07/19/2016   Acute respiratory distress 07/19/2016   Acute respiratory  failure (HCC) 03/08/2020   Anxiety 03/15/2020   Aortic stenosis 11/01/2018   Arthritis    Asthma    Atherosclerosis of native coronary artery of native heart without angina pectoris 03/17/2016   Cardiomyopathy (HCC) 03/15/2020   Chest pain 07/14/2019   CHF (congestive heart failure) (HCC)    Chronic diastolic heart failure (HCC) 03/17/2016   Chronic hypoxemic respiratory failure (HCC)    Chronic kidney disease, stage 3b (HCC) 02/04/2021   CKD (chronic kidney disease)    COPD (chronic obstructive pulmonary disease) (HCC)    COPD with acute exacerbation (HCC) 03/08/2020   Diabetes mellitus without complication (HCC)    Drug allergy 07/21/2016   Essential (primary) hypertension 11/19/2016   Essential hypertension 11/19/2016   Gout 11/19/2016   Heart block AV complete (HCC)    High cholesterol    History of gout 04/05/2023   HOCM (hypertrophic obstructive cardiomyopathy) (HCC) 09/18/2017   Hyperlipidemia associated with type 2 diabetes mellitus (HCC) 11/23/2022   Hyperlipidemia, unspecified 03/15/2020   Hypertension    Hypertensive heart disease with heart failure (HCC) 03/17/2016   Hypertensive heart disease without congestive heart failure 03/15/2020   Hypertrophic cardiomyopathy (HCC) 04/12/2020   Hypokalemia 08/24/2015   Hypomagnesemia    Hypoxia 03/17/2016   Insomnia 11/23/2022   Iron deficiency 03/15/2020   Leukocytosis 07/19/2016   Long term (current) use of insulin  (HCC) 02/04/2021   Mild intermittent asthma 02/04/2021   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   Mixed hyperlipidemia 03/15/2020   Morbid obesity (HCC)    Morbid obesity with BMI of 60.0-69.9, adult (HCC) 07/21/2016   Need for influenza vaccination 11/23/2022   Need for pneumococcal 20-valent conjugate vaccination 11/23/2022  Nonrheumatic aortic valve stenosis 03/17/2016   Obesity hypoventilation syndrome (HCC) 03/08/2020   Obstructive sleep apnea 11/19/2016   Osteoarthritis of knee 03/15/2020   Panniculitis  08/23/2015   Peripheral venous insufficiency 03/15/2020   Pure hypercholesterolemia 02/13/2017   SOB (shortness of breath) 08/24/2015   Troponin level elevated 08/24/2015   Type 2 diabetes mellitus with hyperglycemia (HCC) 07/19/2016   Type 2 diabetes mellitus with stage 3 chronic kidney disease, with long-term current use of insulin  (HCC) 11/19/2016   Type 2 diabetes mellitus without complication, without long-term current use of insulin  (HCC) 04/12/2020   Type 2 diabetes mellitus without complications (HCC) 03/15/2020   Type 2 diabetes mellitus, without long-term current use of insulin  (HCC) 11/19/2016   Significant Hospital Events: Including procedures, antibiotic start and stop dates in addition to other pertinent events   8/24 CXR with pulmonary vascular congestion 8/25 No acute issues overnight, wore BIPAP. pCO2 remains elevated to 100. Wakes up appropriately but dozes back off.  Interim History / Subjective:  No significant events overnight Unfortunately despite wearing BiPAP continuously, remains hypercarbic to pCO2 90s Will wake readily to voice but falling back to sleep quickly Discussed intubation with patient/daughter Harriett), both agree to intubation if necessary Intubated successfully at bedside + bronchoscopy completed by Dr. Kara Post-intubation ABG improved, 7.34/77/235/41.2  Objective:    Blood pressure (!) 100/29, pulse 70, temperature (!) 101.3 F (38.5 C), resp. rate (!) 25, height 5' 3 (1.6 m), weight 127.6 kg, SpO2 99%.    FiO2 (%):  [40 %-50 %] 45 % PEEP:  [8 cmH20-14 cmH20] 8 cmH20 Pressure Support:  [8 cmH20-16 cmH20] 16 cmH20   Intake/Output Summary (Last 24 hours) at 08/29/2023 0826 Last data filed at 08/29/2023 0700 Gross per 24 hour  Intake 852.54 ml  Output 750 ml  Net 102.54 ml   Filed Weights   08/26/23 1700 08/27/23 0500 08/29/23 0419  Weight: 127.4 kg 126.3 kg 127.6 kg   Physical Examination: General: Acute-on-chronically  ill-appearing older woman in NAD. Appears fatigued. HEENT: [place]/AT, anicteric sclera, PERRL, moist mucous membranes. Neuro: Awake, but lethargic/dozes off quickly. Responds to verbal stimuli. Following commands consistently. Moves all 4 extremities spontaneously. Generalized weakness. CV: RRR, no m/g/r. PULM: Breathing even, mildly labored on BiPAP. Lung fields diminished throughout, R > L. GI: Obese, soft, nontender, nondistended. Normoactive bowel sounds. Extremities: No significant LE edema noted. Skin: Warm/dry, no rashes.  Resolved Problem List:   Assessment and Plan:   Shock, presume septic with possible cardiogenic component - Goal MAP > 65 - Fluid resuscitation as tolerated, appears volume up on exam but likely intravascularly deplete - May benefit from albumin volume resuscitation - Levophed  titrated to goal MAP - Co-ox 77, will trend - Trend WBC, fever curve - F/u Cx data including Resp Cx/BAL sent today, 8/27 - Continue broad-spectrum antibiotics (Zosyn , linezolid )  Acute-on-chronic hypoxemic/hypercapnic respiratory failure COVID positive Concern for secondary bacterial PNA COPD with Exacerbation Intubated 8/27 for worsening hypercapnic respiratory failure. - Continue full vent support (4-8cc/kg IBW) - Wean FiO2 for O2 sat > 90% - Daily WUA/SBT once appropriate from a ventilator support standpoint - Bronchodilators as indicated - VAP bundle - Pulmonary hygiene - PAD protocol for sedation: Propofol  and Fentanyl  for goal RASS 0 to -1 - Follow CXR, ABG - F/u Resp Cx/BAL  Hypertrophic Obstructive Cardiomyopathy Echo 8/25 with EF 70-75%, hyperdynamic function, severe concentric LVH, G2DD; mildly enlarged RV. - Trend Co-ox - Diuresis as tolerated; appears to be mostly third-spacing - Concern for intravascular  depletion, trial albumin  AKI on CKD stage IIIa - Trend BMP - Replete electrolytes as indicated - Monitor I&Os - Avoid nephrotoxic agents as able - Ensure  adequate renal perfusion - D/w daughter, [name] re: possible need for CRRT, would be interested in this if necessary  DMII  - SSI - CBGs Q4H - Goal CBG 140-180  Best Practice (right click and Reselect all SmartList Selections daily)   Diet/type: NPO DVT prophylaxis LMWH GI prophylaxis: PPI Lines: PICC line, A-line Foley:  Yes, and it is still needed Code Status:  full code Last date of multidisciplinary goals of care discussion [8/27 - D/w patient's daughter, [name], via phone; would like patient to remain full code at present, consented to intubated, would want CRRT if necessary]  Critical care time:   The patient is critically ill with multiple organ system failure and requires high complexity decision making for assessment and support, frequent evaluation and titration of therapies, advanced monitoring, review of radiographic studies and interpretation of complex data.   Critical Care Time devoted to patient care services, exclusive of separately billable procedures, described in this note is 37 minutes.  Corean CHRISTELLA Eligio Angert, PA-C [facility] 08/29/23 8:26 AM  Please see [email or web address] for pager details.  From 7A-7P if no response, please call [phone] After hours, please call ELink [phone]

## 2023-08-29 NOTE — Care Plan (Signed)
 Patient overnight on vasopressor.  Getting ready to be intubated to protect airway.  Discussed with PCCM.  Patient will be transferred to [facility] service until stabilization.  TRH will sign off.  No charge.

## 2023-08-30 DIAGNOSIS — J9621 Acute and chronic respiratory failure with hypoxia: Secondary | ICD-10-CM | POA: Diagnosis not present

## 2023-08-30 DIAGNOSIS — U071 COVID-19: Secondary | ICD-10-CM | POA: Diagnosis not present

## 2023-08-30 DIAGNOSIS — R579 Shock, unspecified: Secondary | ICD-10-CM | POA: Diagnosis not present

## 2023-08-30 DIAGNOSIS — J9622 Acute and chronic respiratory failure with hypercapnia: Secondary | ICD-10-CM | POA: Diagnosis not present

## 2023-08-30 LAB — CBC WITH DIFFERENTIAL/PLATELET
Abs Immature Granulocytes: 0.09 K/uL — ABNORMAL HIGH (ref 0.00–0.07)
Basophils Absolute: 0 K/uL (ref 0.0–0.1)
Basophils Relative: 0 %
Eosinophils Absolute: 0 K/uL (ref 0.0–0.5)
Eosinophils Relative: 0 %
HCT: 29.6 % — ABNORMAL LOW (ref 36.0–46.0)
Hemoglobin: 8.5 g/dL — ABNORMAL LOW (ref 12.0–15.0)
Immature Granulocytes: 1 %
Lymphocytes Relative: 5 %
Lymphs Abs: 0.4 K/uL — ABNORMAL LOW (ref 0.7–4.0)
MCH: 25.9 pg — ABNORMAL LOW (ref 26.0–34.0)
MCHC: 28.7 g/dL — ABNORMAL LOW (ref 30.0–36.0)
MCV: 90.2 fL (ref 80.0–100.0)
Monocytes Absolute: 0.4 K/uL (ref 0.1–1.0)
Monocytes Relative: 4 %
Neutro Abs: 8 K/uL — ABNORMAL HIGH (ref 1.7–7.7)
Neutrophils Relative %: 90 %
Platelets: 75 K/uL — ABNORMAL LOW (ref 150–400)
RBC: 3.28 MIL/uL — ABNORMAL LOW (ref 3.87–5.11)
RDW: 17.5 % — ABNORMAL HIGH (ref 11.5–15.5)
Smear Review: NORMAL
WBC: 8.8 K/uL (ref 4.0–10.5)
nRBC: 0.2 % (ref 0.0–0.2)

## 2023-08-30 LAB — GLUCOSE, CAPILLARY
Glucose-Capillary: 213 mg/dL — ABNORMAL HIGH (ref 70–99)
Glucose-Capillary: 197 mg/dL — ABNORMAL HIGH (ref 70–99)
Glucose-Capillary: 243 mg/dL — ABNORMAL HIGH (ref 70–99)
Glucose-Capillary: 206 mg/dL — ABNORMAL HIGH (ref 70–99)
Glucose-Capillary: 229 mg/dL — ABNORMAL HIGH (ref 70–99)
Glucose-Capillary: 221 mg/dL — ABNORMAL HIGH (ref 70–99)

## 2023-08-30 LAB — BASIC METABOLIC PANEL WITH GFR
Anion gap: 13 (ref 5–15)
BUN: 74 mg/dL — ABNORMAL HIGH (ref 8–23)
CO2: 32 mmol/L (ref 22–32)
Calcium: 8.6 mg/dL — ABNORMAL LOW (ref 8.9–10.3)
Chloride: 97 mmol/L — ABNORMAL LOW (ref 98–111)
Creatinine, Ser: 1.91 mg/dL — ABNORMAL HIGH (ref 0.44–1.00)
GFR, Estimated: 27 mL/min — ABNORMAL LOW (ref >60)
Glucose, Bld: 230 mg/dL — ABNORMAL HIGH (ref 70–99)
Potassium: 3.3 mmol/L — ABNORMAL LOW (ref 3.5–5.1)
Sodium: 142 mmol/L (ref 135–145)

## 2023-08-30 LAB — TRIGLYCERIDES: Triglycerides: 155 mg/dL — ABNORMAL HIGH (ref <150)

## 2023-08-30 MED ORDER — LACTATED RINGERS IV SOLN: INTRAVENOUS | Status: AC

## 2023-08-30 MED ORDER — ENOXAPARIN SODIUM 40 MG/0.4ML IJ SOSY
40.0000 mg | PREFILLED_SYRINGE | INTRAMUSCULAR | Status: DC
  Administered 2023-08-30 – 2023-09-09 (×11): 40 mg via SUBCUTANEOUS
  Filled 2023-08-30 (×11): qty 0.4

## 2023-08-30 MED ORDER — POTASSIUM CHLORIDE 10 MEQ/50ML IV SOLN
10.0000 meq | INTRAVENOUS | Status: AC
  Administered 2023-08-30 (×2): 10 meq via INTRAVENOUS
  Filled 2023-08-30 (×2): qty 50

## 2023-08-30 NOTE — Plan of Care (Signed)
  Problem: Education: Goal: Knowledge of General Education information will improve Description: Including pain rating scale, medication(s)/side effects and non-pharmacologic comfort measures Outcome: Not Progressing   Problem: Clinical Measurements: Goal: Respiratory complications will improve Outcome: Not Progressing Goal: Cardiovascular complication will be avoided Outcome: Not Progressing   Problem: Pain Managment: Goal: General experience of comfort will improve and/or be controlled Outcome: Not Progressing

## 2023-08-30 NOTE — Progress Notes (Signed)
 NAME:  Morgan Fischer, MRN:  [id number], DOB:  March 04, 1950, LOS: 4 ADMISSION DATE:  08/26/2023, CONSULTATION DATE: 08/26/2023 REFERRING MD: Redia - TRH, CHIEF COMPLAINT: Acute on chronic hypoxemic/hypercarbic respiratory failure  History of Present Illness:  Patient with known history of chronic respiratory failure on BiPAP at night for hypercapnic respiratory failure, history of chronic diastolic congestive heart failure Transferred from med [facility] where she had presented with worsening shortness of breath of about 5 days duration She has had a cough, no sputum production, no chest pain or chest discomfort No fevers or chills No contact with anyone with a febrile illness No change in bowel habits, no dysuria  Recently followed up at [facility] last Wednesday and prior to that visit was feeling fine but she felt that there may have been people who was sick in the waiting area  Normally on 2 L of oxygen  during the day but in the last few weeks oxygen  levels were increased to 3 L because of desaturations  History of hypertrophic cardiomyopathy, chronic kidney disease stage IIIa, type 2 diabetes asthma/COPD, morbid obesity, usual activity tolerance about 20-30 steps, never smoker  Has been compliant with her usual medications  Tested positive for COVID but does not recollect being around anybody with acute febrile illness  Pertinent Medical History:   Past Medical History:  Diagnosis Date   Acute diastolic heart failure (HCC) 07/17/2019   Acute hypercapnic respiratory failure (HCC) 07/21/2016   Acute on chronic congestive heart failure (HCC) 07/19/2016   Acute on chronic diastolic CHF (congestive heart failure) (HCC) 03/08/2020   Acute on chronic respiratory failure with hypoxia (HCC) 10/20/2018   Acute on chronic respiratory failure with hypoxia and hypercapnia (HCC) 03/08/2020   Acute respiratory acidosis (HCC) 07/19/2016   Acute respiratory distress 07/19/2016   Acute respiratory  failure (HCC) 03/08/2020   Anxiety 03/15/2020   Aortic stenosis 11/01/2018   Arthritis    Asthma    Atherosclerosis of native coronary artery of native heart without angina pectoris 03/17/2016   Cardiomyopathy (HCC) 03/15/2020   Chest pain 07/14/2019   CHF (congestive heart failure) (HCC)    Chronic diastolic heart failure (HCC) 03/17/2016   Chronic hypoxemic respiratory failure (HCC)    Chronic kidney disease, stage 3b (HCC) 02/04/2021   CKD (chronic kidney disease)    COPD (chronic obstructive pulmonary disease) (HCC)    COPD with acute exacerbation (HCC) 03/08/2020   Diabetes mellitus without complication (HCC)    Drug allergy 07/21/2016   Essential (primary) hypertension 11/19/2016   Essential hypertension 11/19/2016   Gout 11/19/2016   Heart block AV complete (HCC)    High cholesterol    History of gout 04/05/2023   HOCM (hypertrophic obstructive cardiomyopathy) (HCC) 09/18/2017   Hyperlipidemia associated with type 2 diabetes mellitus (HCC) 11/23/2022   Hyperlipidemia, unspecified 03/15/2020   Hypertension    Hypertensive heart disease with heart failure (HCC) 03/17/2016   Hypertensive heart disease without congestive heart failure 03/15/2020   Hypertrophic cardiomyopathy (HCC) 04/12/2020   Hypokalemia 08/24/2015   Hypomagnesemia    Hypoxia 03/17/2016   Insomnia 11/23/2022   Iron deficiency 03/15/2020   Leukocytosis 07/19/2016   Long term (current) use of insulin  (HCC) 02/04/2021   Mild intermittent asthma 02/04/2021   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   Mixed hyperlipidemia 03/15/2020   Morbid obesity (HCC)    Morbid obesity with BMI of 60.0-69.9, adult (HCC) 07/21/2016   Need for influenza vaccination 11/23/2022   Need for pneumococcal 20-valent conjugate vaccination 11/23/2022  Nonrheumatic aortic valve stenosis 03/17/2016   Obesity hypoventilation syndrome (HCC) 03/08/2020   Obstructive sleep apnea 11/19/2016   Osteoarthritis of knee 03/15/2020   Panniculitis  08/23/2015   Peripheral venous insufficiency 03/15/2020   Pure hypercholesterolemia 02/13/2017   SOB (shortness of breath) 08/24/2015   Troponin level elevated 08/24/2015   Type 2 diabetes mellitus with hyperglycemia (HCC) 07/19/2016   Type 2 diabetes mellitus with stage 3 chronic kidney disease, with long-term current use of insulin  (HCC) 11/19/2016   Type 2 diabetes mellitus without complication, without long-term current use of insulin  (HCC) 04/12/2020   Type 2 diabetes mellitus without complications (HCC) 03/15/2020   Type 2 diabetes mellitus, without long-term current use of insulin  (HCC) 11/19/2016   Significant Hospital Events: Including procedures, antibiotic start and stop dates in addition to other pertinent events   8/24 CXR with pulmonary vascular congestion 8/25 No acute issues overnight, wore BIPAP. pCO2 remains elevated to 100. Wakes up appropriately but dozes back off.  Interim History / Subjective:  Overnight, anisocoria noted prompted CT Head CT Head fortunately [facility] No other significant events Remains intubated, sedated, sats improved Off of vasopressors  Objective:    Blood pressure (!) 100/29, pulse 72, temperature 98.4 F (36.9 C), resp. rate (!) 24, height 5' 3 (1.6 m), weight 131.5 kg, SpO2 99%.    Vent Mode: PRVC FiO2 (%):  [35 %-80 %] 35 % Set Rate:  [24 bmp] 24 bmp Vt Set:  [410 mL] 410 mL PEEP:  [8 cmH20] 8 cmH20 Plateau Pressure:  [23 cmH20-26 cmH20] 23 cmH20   Intake/Output Summary (Last 24 hours) at 08/30/2023 1124 Last data filed at 08/30/2023 9046 Gross per 24 hour  Intake 3159.68 ml  Output 1775 ml  Net 1384.68 ml   Filed Weights   08/27/23 0500 08/29/23 0419 08/30/23 0500  Weight: 126.3 kg 127.6 kg 131.5 kg   Physical Examination: General: Acute-on-chronically ill-appearing older woman in NAD. HEENT: [place]/AT, anicteric sclera, pupils with slight difference L 36mm/R 2.19mm both reactive, moist mucous membranes. Neuro: Intubated, sedated  Grimaces to noxious stimuli. Not following commands. Moves all 4 extremities spontaneously. Generalized weakness. +Corneal, +Cough, and +Gag  CV: RRR, +III/VI systolic murmur noted. PULM: Breathing even and unlabored on vent (PEEP 8, FiO2 35%). Lung fields with scattered rhonchi R > L. GI: Obese, soft, nontender, nondistended. Normoactive bowel sounds. Extremities: Bialteral 1+ nonpitting LE edema noted. Skin: Warm/dry, no rashes.  Resolved Problem List:   Assessment and Plan:   Shock, presume septic with possible cardiogenic component - Goal MAP > 65 - Fluid resuscitation as tolerated, appears volume up on exam but likely intravascularly deplete, LR bolus given - Intermittent CVP monitoring - Levophed  titrated to goal MAP, off of vasopressors at present - Trending Co-ox - Trend WBC, fever curve - F/u Cx data, NGTD - Continue broad-spectrum antibiotics (Zosyn /linezolid /doxy)  Acute-on-chronic hypoxemic/hypercapnic respiratory failure COVID positive Concern for secondary bacterial PNA COPD with Exacerbation Intubated 8/27 for worsening hypercapnic respiratory failure. - Continue full vent support (4-8cc/kg IBW) - Wean FiO2 for O2 sat > 90% - Daily WUA/SBT once appropriate from a vent support standpoint - Bronchodilators as indicated - VAP bundle - Pulmonary hygiene - PAD protocol for sedation: Propofol  and Fentanyl  for goal RASS 0 to -1 - Follow CXR, ABG - F/u Resp Cx/BAL, no growth thus far  Hypertrophic Obstructive Cardiomyopathy Echo 8/25 with EF 70-75%, hyperdynamic function, severe concentric LVH, G2DD; mildly enlarged RV.  - Trend Co-ox - Diuresis as tolerated, appears to be mostly  third-spacing - Ongoing concern for intravascular depletion, will give LR, can also consider albumin  AKI on CKD stage IIIa, improving - Trend BMP - Replete electrolytes as indicated - Gentle volume resuscitation - Monitor I&Os - Avoid nephrotoxic agents as able - Ensure adequate renal  perfusion - D/w daughter, [name] re: possible need for CRRT, would be interested in this if necessary  DMII  - SSI - CBGs Q4H - Goal CBG 140-180  Best Practice (right click and Reselect all SmartList Selections daily)   Diet/type: NPO DVT prophylaxis LMWH GI prophylaxis: PPI Lines: PICC line, A-line Foley:  Yes, and it is still needed Code Status:  full code Last date of multidisciplinary goals of care discussion [8/27 - D/w patient's daughter, [name], via phone; would like patient to remain full code at present, consented to intubated, would want CRRT if necessary; updated via phone 8/28]  Critical care time:   The patient is critically ill with multiple organ system failure and requires high complexity decision making for assessment and support, frequent evaluation and titration of therapies, advanced monitoring, review of radiographic studies and interpretation of complex data.   Critical Care Time devoted to patient care services, exclusive of separately billable procedures, described in this note is 35 minutes.  Corean CHRISTELLA Jazariah Teall, PA-C [facility] 08/30/23 11:24 AM  Please see [email or web address] for pager details.  From 7A-7P if no response, please call [phone] After hours, please call ELink [phone]

## 2023-08-30 NOTE — Progress Notes (Signed)
 PT Cancellation Note  Patient Details Name: Morgan Fischer MRN: [id number] DOB: 10-15-1950   Cancelled Treatment:    Reason Eval/Treat Not Completed: Medical issues which prohibited therapy  Patient is now on the vent.  PT will follow,. Darice Potters PT Acute Rehabilitation Services Office [phone]  Potters Darice Norris 08/30/2023, 10:37 AM

## 2023-08-30 NOTE — Inpatient Diabetes Management (Signed)
 Inpatient Diabetes Program Recommendations  AACE/ADA: New Consensus Statement on Inpatient Glycemic Control (2015)  Target Ranges:  Prepandial:   less than 140 mg/dL      Peak postprandial:   less than 180 mg/dL (1-2 hours)      Critically ill patients:  140 - 180 mg/dL   Lab Results  Component Value Date   GLUCAP 197 (H) 08/30/2023   HGBA1C 7.3 04/03/2023    Review of Glycemic Control  Latest Reference Range & Units 08/30/23 04:29 08/30/23 07:35  Glucose-Capillary 70 - 99 mg/dL 786 (H) 802 (H)  (H): Data is abnormally high Diabetes history: Type 2 DM Outpatient Diabetes medications: Lantus  26 units every day, Farxiga 10 mg every day, Januvia 25 mg QD Current orders for Inpatient glycemic control: Novolog  0-20 units Q4H, Semglee  10 units at bedtime, Tradjenta  5 mg every day Solumedrol 40 mg BID Vital @ 55 ml/hr  Inpatient Diabetes Program Recommendations:    In the setting of steroids and tube feeds,consider adding Novolog  6 units Q4H for tube feed coverage (to be stopped or held if tube feeds stopped).   Thanks,  Tinnie Minus, MSN, RNC-OB Diabetes Coordinator [phone] (8a-5p)

## 2023-08-31 ENCOUNTER — Inpatient Hospital Stay (HOSPITAL_COMMUNITY)

## 2023-08-31 DIAGNOSIS — J441 Chronic obstructive pulmonary disease with (acute) exacerbation: Secondary | ICD-10-CM | POA: Diagnosis not present

## 2023-08-31 DIAGNOSIS — E876 Hypokalemia: Secondary | ICD-10-CM

## 2023-08-31 DIAGNOSIS — Z4682 Encounter for fitting and adjustment of non-vascular catheter: Secondary | ICD-10-CM | POA: Diagnosis not present

## 2023-08-31 DIAGNOSIS — I421 Obstructive hypertrophic cardiomyopathy: Secondary | ICD-10-CM | POA: Diagnosis not present

## 2023-08-31 DIAGNOSIS — Z7189 Other specified counseling: Secondary | ICD-10-CM | POA: Diagnosis not present

## 2023-08-31 DIAGNOSIS — I5033 Acute on chronic diastolic (congestive) heart failure: Secondary | ICD-10-CM | POA: Diagnosis not present

## 2023-08-31 DIAGNOSIS — Z452 Encounter for adjustment and management of vascular access device: Secondary | ICD-10-CM | POA: Diagnosis not present

## 2023-08-31 DIAGNOSIS — Z515 Encounter for palliative care: Secondary | ICD-10-CM | POA: Diagnosis not present

## 2023-08-31 DIAGNOSIS — J9622 Acute and chronic respiratory failure with hypercapnia: Secondary | ICD-10-CM | POA: Diagnosis not present

## 2023-08-31 DIAGNOSIS — U071 COVID-19: Secondary | ICD-10-CM | POA: Diagnosis not present

## 2023-08-31 DIAGNOSIS — J9690 Respiratory failure, unspecified, unspecified whether with hypoxia or hypercapnia: Secondary | ICD-10-CM | POA: Diagnosis not present

## 2023-08-31 DIAGNOSIS — J9621 Acute and chronic respiratory failure with hypoxia: Secondary | ICD-10-CM | POA: Diagnosis not present

## 2023-08-31 DIAGNOSIS — I517 Cardiomegaly: Secondary | ICD-10-CM | POA: Diagnosis not present

## 2023-08-31 LAB — CBC WITH DIFFERENTIAL/PLATELET
Abs Immature Granulocytes: 0.26 K/uL — ABNORMAL HIGH (ref 0.00–0.07)
Basophils Absolute: 0 K/uL (ref 0.0–0.1)
Basophils Relative: 0 %
Eosinophils Absolute: 0 K/uL (ref 0.0–0.5)
Eosinophils Relative: 0 %
HCT: 29.3 % — ABNORMAL LOW (ref 36.0–46.0)
Hemoglobin: 8.8 g/dL — ABNORMAL LOW (ref 12.0–15.0)
Immature Granulocytes: 2 %
Lymphocytes Relative: 3 %
Lymphs Abs: 0.4 K/uL — ABNORMAL LOW (ref 0.7–4.0)
MCH: 26.5 pg (ref 26.0–34.0)
MCHC: 30 g/dL (ref 30.0–36.0)
MCV: 88.3 fL (ref 80.0–100.0)
Monocytes Absolute: 0.5 K/uL (ref 0.1–1.0)
Monocytes Relative: 4 %
Neutro Abs: 12.4 K/uL — ABNORMAL HIGH (ref 1.7–7.7)
Neutrophils Relative %: 91 %
Platelets: 93 K/uL — ABNORMAL LOW (ref 150–400)
RBC: 3.32 MIL/uL — ABNORMAL LOW (ref 3.87–5.11)
RDW: 17.6 % — ABNORMAL HIGH (ref 11.5–15.5)
Smear Review: NORMAL
WBC: 13.6 K/uL — ABNORMAL HIGH (ref 4.0–10.5)
nRBC: 0.2 % (ref 0.0–0.2)

## 2023-08-31 LAB — GLUCOSE, CAPILLARY
Glucose-Capillary: 181 mg/dL — ABNORMAL HIGH (ref 70–99)
Glucose-Capillary: 206 mg/dL — ABNORMAL HIGH (ref 70–99)
Glucose-Capillary: 211 mg/dL — ABNORMAL HIGH (ref 70–99)
Glucose-Capillary: 216 mg/dL — ABNORMAL HIGH (ref 70–99)
Glucose-Capillary: 241 mg/dL — ABNORMAL HIGH (ref 70–99)
Glucose-Capillary: 246 mg/dL — ABNORMAL HIGH (ref 70–99)
Glucose-Capillary: 249 mg/dL — ABNORMAL HIGH (ref 70–99)

## 2023-08-31 LAB — CULTURE, BAL-QUANTITATIVE W GRAM STAIN: Culture: NO GROWTH

## 2023-08-31 LAB — BASIC METABOLIC PANEL WITH GFR
Anion gap: 14 (ref 5–15)
BUN: 69 mg/dL — ABNORMAL HIGH (ref 8–23)
CO2: 32 mmol/L (ref 22–32)
Calcium: 8.8 mg/dL — ABNORMAL LOW (ref 8.9–10.3)
Chloride: 98 mmol/L (ref 98–111)
Creatinine, Ser: 1.6 mg/dL — ABNORMAL HIGH (ref 0.44–1.00)
GFR, Estimated: 33 mL/min — ABNORMAL LOW (ref >60)
Glucose, Bld: 239 mg/dL — ABNORMAL HIGH (ref 70–99)
Potassium: 3.3 mmol/L — ABNORMAL LOW (ref 3.5–5.1)
Sodium: 144 mmol/L (ref 135–145)

## 2023-08-31 LAB — PHOSPHORUS: Phosphorus: 2.1 mg/dL — ABNORMAL LOW (ref 2.5–4.6)

## 2023-08-31 LAB — MAGNESIUM: Magnesium: 2.7 mg/dL — ABNORMAL HIGH (ref 1.7–2.4)

## 2023-08-31 MED ORDER — INSULIN ASPART 100 UNIT/ML IJ SOLN
4.0000 [IU] | INTRAMUSCULAR | Status: DC
  Administered 2023-08-31 – 2023-09-08 (×47): 4 [IU] via SUBCUTANEOUS

## 2023-08-31 MED ORDER — HYDRALAZINE HCL 20 MG/ML IJ SOLN
10.0000 mg | INTRAMUSCULAR | Status: DC | PRN
  Administered 2023-09-03 – 2023-09-07 (×6): 10 mg via INTRAVENOUS
  Filled 2023-08-31 (×5): qty 1

## 2023-08-31 MED ORDER — ADULT MULTIVITAMIN LIQUID CH
15.0000 mL | Freq: Every day | ORAL | Status: DC
  Administered 2023-08-31 – 2023-09-09 (×10): 15 mL
  Filled 2023-08-31 (×9): qty 15

## 2023-08-31 MED ORDER — SENNOSIDES 8.8 MG/5ML PO SYRP
10.0000 mL | ORAL_SOLUTION | Freq: Two times a day (BID) | ORAL | Status: DC
  Administered 2023-08-31 – 2023-09-09 (×13): 10 mL
  Filled 2023-08-31 (×20): qty 10

## 2023-08-31 MED ORDER — POTASSIUM & SODIUM PHOSPHATES 280-160-250 MG PO PACK
2.0000 | PACK | ORAL | Status: AC
  Administered 2023-08-31 (×3): 2
  Filled 2023-08-31 (×3): qty 2

## 2023-08-31 MED ORDER — POTASSIUM CHLORIDE 20 MEQ PO PACK
20.0000 meq | PACK | Freq: Once | ORAL | Status: AC
  Administered 2023-08-31: 20 meq
  Filled 2023-08-31: qty 1

## 2023-08-31 MED ORDER — BISACODYL 10 MG RE SUPP: 10.0000 mg | Freq: Every day | RECTAL | Status: DC | PRN

## 2023-08-31 NOTE — Plan of Care (Signed)
  Problem: Nutrition: Goal: Adequate nutrition will be maintained Outcome: Progressing   Problem: Respiratory: Goal: Will maintain a patent airway Outcome: Not Progressing   Problem: Education: Goal: Knowledge of General Education information will improve Description: Including pain rating scale, medication(s)/side effects and non-pharmacologic comfort measures Outcome: Not Progressing   Problem: Clinical Measurements: Goal: Respiratory complications will improve Outcome: Not Progressing Goal: Cardiovascular complication will be avoided Outcome: Not Progressing   Problem: Elimination: Goal: Will not experience complications related to bowel motility Outcome: Not Progressing

## 2023-08-31 NOTE — Progress Notes (Signed)
  Daily Progress Note   Patient Name: Morgan Fischer       Date: 08/31/2023 DOB: 06-Jun-1950  Age: 73 y.o. MRN#: [id number] Attending Physician: Kara Dorn NOVAK, MD Primary Care Physician: Antonio Meth, [name] SAUNDERS, DO Admit Date: 08/26/2023 Length of Stay: 5 days  Reason for Consultation/Follow-up: Establishing goals of care  Subjective:   CC: Patient remains intubated and sedated.  Following up regarding complex medical decision making.  Subjective:  Reviewed EMR including recent documentation from [facility] provider.  Patient continues to receive broad-spectrum antibiotics.  Patient failed SBT trial today due to hypoxia.  Reviewed patient's BMP and noted creatinine has trended down to 1.6 with GFR estimated 33.  Presented to bedside to see patient.  No visitors present at bedside.  Patient remains intubated and sedated.  Discussed care with bedside RN for medical updates.  Daughter also has COVID and so has been calling for updates.  Plan is to continue current aggressive medical interventions at this time.  Still allowing time for outcomes.  Noted palliative medicine team continue to follow with patient's medical journey.  Objective:   Vital Signs:  BP (!) 100/29 (BP Location: Right Arm)   Pulse 65   Temp 99.5 F (37.5 C) (Bladder)   Resp (!) 24   Ht 5' 3 (1.6 m)   Wt 134.5 kg   LMP  (LMP Unknown)   SpO2 97%   BMI 52.53 kg/m   Physical Exam: General: Sedated on ventilator support Cardiovascular: Bradycardia noted Respiratory: Intubated on ventilator support  Neuro: Sedated  Assessment & Plan:   Assessment: Patient is a 73 year old female with a past medical history of chronic hypoxic and hypercapnic respiratory failure on 2 L nasal cannula and daytime and nighttime BiPAP, HOCM, HFpEF, COPD, diabetes mellitus type 2, hyperlipidemia, and CKD who was admitted on 08/26/2023 for management of shortness of breath and cough.  During hospitalization, patient required BiPAP support for  acute on chronic hypoxic and hypercapnic respiratory failure in setting of COVID-19 infection diagnosis and heart failure and COPD exacerbation.  Patient was transferred to stepdown unit on 08/28/2023 due to worsening respiratory status.  Palliative medicine team consulted to assist with complex medical decision making.   Recommendations/Plan: # Complex medical decision making/goals of care:   - Unable to discuss care planning with patient due to medical condition.  Care team has already been discussing plans with daughter at this time.  Continuing aggressive medical interventions to allow time for outcomes.  Daughter noted to also have COVID so currently unable to come to bedside.  Noted palliative medicine team would continue to follow along to engage in conversations as able and appropriate.                -  Code Status: Full Code    # Psycho-social/Spiritual Support:  - Support System: Daughter/NOK in EMR- [name] Defina   # Discharge Planning:  To Be Determined  Thank you for allowing the palliative care team to participate in the care [facility].  Tinnie Radar, DO Palliative Care Provider PMT # [phone]  If patient remains symptomatic despite maximum doses, please call PMT at [phone] between 0700 and 1900. Outside of these hours, please call attending, as PMT does not have night coverage.

## 2023-08-31 NOTE — Progress Notes (Signed)
 [facility] ADULT ICU REPLACEMENT PROTOCOL   The patient does apply for the [facility] Adult ICU Electrolyte Replacment Protocol based on the criteria listed below:   1.Exclusion criteria: TCTS, ECMO, Dialysis, and Myasthenia Gravis patients 2. Is GFR >/= 30 ml/min? Yes.    Patient's GFR today is 33 3. Is SCr </= 2? Yes.   Patient's SCr is 1.60 mg/dL 4. Did SCr increase >/= 0.5 in 24 hours? No. 5.Pt's weight >40kg  Yes.   6. Abnormal electrolyte(s): K+ 3.3, Phos 2.1  7. Electrolytes replaced per protocol 8.  Call MD STAT for K+ </= 2.5, Phos </= 1, or Mag </= 1 Physician:  Epimenio Ole BIRCH [facility] 08/31/2023 4:11 AM

## 2023-08-31 NOTE — Progress Notes (Signed)
 eLink Physician-Brief Progress Note Patient Name: Morgan Fischer DOB: 02/03/1950 MRN: [id number]   Date of Service  08/31/2023  HPI/Events of Note  Sub-optimal blood pressure control.  eICU Interventions  PRN iv Hydralazine  ordered.        [name] [name] 08/31/2023, 2:23 AM

## 2023-08-31 NOTE — TOC Progression Note (Signed)
 Transition of Care [facility]) - Progression Note    Patient Details  Name: Morgan Fischer MRN: [id number] Date of Birth: 01/06/1950  Transition of Care [facility]) CM/SW Contact  Jon ONEIDA Anon, RN Phone Number: 08/31/2023, 2:15 PM  Clinical Narrative:    Pt remains intubated on ventilator. Palliative medicine has been consulted and following to establish GOC. IP Care Management is continuing to follow.   Expected Discharge Plan: Skilled Nursing Facility Barriers to Discharge: Continued Medical Work up               Expected Discharge Plan and Services In-house Referral: Hospice / Palliative Care Discharge Planning Services: CM Consult Post Acute Care Choice: Skilled Nursing Facility Living arrangements for the past 2 months: Single Family Home                 DME Arranged: N/A DME Agency: NA       HH Arranged: NA HH Agency: NA         Social Drivers of Health (SDOH) Interventions SDOH Screenings   Food Insecurity: No Food Insecurity (08/26/2023)  Housing: Low Risk  (08/26/2023)  Transportation Needs: No Transportation Needs (08/26/2023)  Utilities: Not At Risk (08/26/2023)  Alcohol Screen: Low Risk  (02/12/2023)  Depression (PHQ2-9): Low Risk  (02/13/2023)  Financial Resource Strain: Medium Risk (07/10/2023)  Physical Activity: Inactive (07/10/2023)  Social Connections: Moderately Integrated (08/26/2023)  Stress: Stress Concern Present (07/10/2023)  Tobacco Use: Low Risk  (08/26/2023)  Health Literacy: Adequate Health Literacy (02/13/2023)    Readmission Risk Interventions    08/29/2023    3:35 PM  Readmission Risk Prevention Plan  Transportation Screening Complete  PCP or Specialist Appt within 3-5 Days Complete  HRI or Home Care Consult Complete  Social Work Consult for Recovery Care Planning/Counseling Complete  Palliative Care Screening Complete  Medication Review Oceanographer) Complete

## 2023-08-31 NOTE — Progress Notes (Signed)
 NAME:  Morgan Fischer, MRN:  [id number], DOB:  Mar 14, 1950, LOS: 5 ADMISSION DATE:  08/26/2023, CONSULTATION DATE: 08/26/2023 REFERRING MD: Redia - TRH, CHIEF COMPLAINT: Acute on chronic hypoxemic/hypercarbic respiratory failure  History of Present Illness:  Patient with known history of chronic respiratory failure on BiPAP at night for hypercapnic respiratory failure, history of chronic diastolic congestive heart failure Transferred from [facility] where she had presented with worsening shortness of breath of about 5 days duration She has had a cough, no sputum production, no chest pain or chest discomfort No fevers or chills No contact with anyone with a febrile illness No change in bowel habits, no dysuria  Recently followed up at [facility] last Wednesday and prior to that visit was feeling fine but she felt that there may have been people who was sick in the waiting area  Normally on 2 L of oxygen  during the day but in the last few weeks oxygen  levels were increased to 3 L because of desaturations  History of hypertrophic cardiomyopathy, chronic kidney disease stage IIIa, type 2 diabetes asthma/COPD, morbid obesity, usual activity tolerance about 20-30 steps, never smoker  Has been compliant with her usual medications  Tested positive for COVID but does not recollect being around anybody with acute febrile illness  Pertinent Medical History:   Past Medical History:  Diagnosis Date   Acute diastolic heart failure (HCC) 07/17/2019   Acute hypercapnic respiratory failure (HCC) 07/21/2016   Acute on chronic congestive heart failure (HCC) 07/19/2016   Acute on chronic diastolic CHF (congestive heart failure) (HCC) 03/08/2020   Acute on chronic respiratory failure with hypoxia (HCC) 10/20/2018   Acute on chronic respiratory failure with hypoxia and hypercapnia (HCC) 03/08/2020   Acute respiratory acidosis (HCC) 07/19/2016   Acute respiratory distress 07/19/2016   Acute respiratory  failure (HCC) 03/08/2020   Anxiety 03/15/2020   Aortic stenosis 11/01/2018   Arthritis    Asthma    Atherosclerosis of native coronary artery of native heart without angina pectoris 03/17/2016   Cardiomyopathy (HCC) 03/15/2020   Chest pain 07/14/2019   CHF (congestive heart failure) (HCC)    Chronic diastolic heart failure (HCC) 03/17/2016   Chronic hypoxemic respiratory failure (HCC)    Chronic kidney disease, stage 3b (HCC) 02/04/2021   CKD (chronic kidney disease)    COPD (chronic obstructive pulmonary disease) (HCC)    COPD with acute exacerbation (HCC) 03/08/2020   Diabetes mellitus without complication (HCC)    Drug allergy 07/21/2016   Essential (primary) hypertension 11/19/2016   Essential hypertension 11/19/2016   Gout 11/19/2016   Heart block AV complete (HCC)    High cholesterol    History of gout 04/05/2023   HOCM (hypertrophic obstructive cardiomyopathy) (HCC) 09/18/2017   Hyperlipidemia associated with type 2 diabetes mellitus (HCC) 11/23/2022   Hyperlipidemia, unspecified 03/15/2020   Hypertension    Hypertensive heart disease with heart failure (HCC) 03/17/2016   Hypertensive heart disease without congestive heart failure 03/15/2020   Hypertrophic cardiomyopathy (HCC) 04/12/2020   Hypokalemia 08/24/2015   Hypomagnesemia    Hypoxia 03/17/2016   Insomnia 11/23/2022   Iron deficiency 03/15/2020   Leukocytosis 07/19/2016   Long term (current) use of insulin  (HCC) 02/04/2021   Mild intermittent asthma 02/04/2021   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   Mixed hyperlipidemia 03/15/2020   Morbid obesity (HCC)    Morbid obesity with BMI of 60.0-69.9, adult (HCC) 07/21/2016   Need for influenza vaccination 11/23/2022   Need for pneumococcal 20-valent conjugate vaccination 11/23/2022  Nonrheumatic aortic valve stenosis 03/17/2016   Obesity hypoventilation syndrome (HCC) 03/08/2020   Obstructive sleep apnea 11/19/2016   Osteoarthritis of knee 03/15/2020   Panniculitis  08/23/2015   Peripheral venous insufficiency 03/15/2020   Pure hypercholesterolemia 02/13/2017   SOB (shortness of breath) 08/24/2015   Troponin level elevated 08/24/2015   Type 2 diabetes mellitus with hyperglycemia (HCC) 07/19/2016   Type 2 diabetes mellitus with stage 3 chronic kidney disease, with long-term current use of insulin  (HCC) 11/19/2016   Type 2 diabetes mellitus without complication, without long-term current use of insulin  (HCC) 04/12/2020   Type 2 diabetes mellitus without complications (HCC) 03/15/2020   Type 2 diabetes mellitus, without long-term current use of insulin  (HCC) 11/19/2016   Significant Hospital Events: Including procedures, antibiotic start and stop dates in addition to other pertinent events   8/24 CXR with pulmonary vascular congestion 8/25 No acute issues overnight, wore BIPAP. pCO2 remains elevated to 100. Wakes up appropriately but dozes back off. 8/27 intubated  8/28 Overnight, anisocoria noted prompted CT Head . Negative  8/29 some issues with hypertension overnight   Interim History / Subjective:  Lightly sedated on vent   Objective:    Blood pressure (!) 100/29, pulse 65, temperature 99.5 F (37.5 C), resp. rate (!) 24, height 5' 3 (1.6 m), weight 134.5 kg, SpO2 97%.    Vent Mode: PRVC FiO2 (%):  [35 %] 35 % Set Rate:  [24 bmp] 24 bmp Vt Set:  [410 mL] 410 mL PEEP:  [8 cmH20] 8 cmH20 Pressure Support:  [8 cmH20] 8 cmH20 Plateau Pressure:  [24 cmH20-26 cmH20] 26 cmH20   Intake/Output Summary (Last 24 hours) at 08/31/2023 9178 Last data filed at 08/31/2023 9356 Gross per 24 hour  Intake 3421.44 ml  Output 1825 ml  Net 1596.44 ml   Filed Weights   08/29/23 0419 08/30/23 0500 08/31/23 0406  Weight: 127.6 kg 131.5 kg 134.5 kg   Physical Examination: General: Acute on chronically ill appearing elderly female lying in bed on mechanical ventilation, in NAD HEENT: ETT, MM pink/moist, PERRL,  Neuro: Lightly sedated on vent, non-focal   CV: s1s2 regular rate and rhythm, no murmur, rubs, or gallops,  PULM:  Clear to auscultation, no increased work of breathing, no added breath sounds, tolerating vent  GI: soft, bowel sounds active in all 4 quadrants, non-tender, non-distended, tolerating TF Extremities: warm/dry, no edema  Skin: no rashes or lesions  Resolved Problem List:  Shock   Assessment and Plan:   Acute-on-chronic hypoxemic/hypercapnic respiratory failure COVID positive Concern for secondary bacterial PNA COPD with Exacerbation -Intubated 8/27 for worsening hypercapnic respiratory failure.8/27. BAL preformed  P: Failed SBT trial this a.m. due to hypoxia Continue ventilator support with lung protective strategies  Wean PEEP and FiO2 for sats greater than 90%. Head of bed elevated 30 degrees. Plateau pressures less than 30 cm H20.  Follow intermittent chest x-ray and ABG.   SAT/SBT as tolerated, mentation preclude extubation  Ensure adequate pulmonary hygiene  Follow cultures  VAP bundle in place  PAD protocol Continue broad spectrum Zosyn , Linesolid, and doxy   Hypertrophic Obstructive Cardiomyopathy -Echo 8/25 with EF 70-75%, hyperdynamic function, severe concentric LVH, G2DD; mildly enlarged RV. P: Continuous telemetry  Continue ASA and statin Strict intake and output  Daily weight to assess volume status Daily assessment for need to diurese  Closely monitor renal function and electrolytes   AKI on CKD stage IIIa, improving - D/w daughter, Morgan Fischer re: possible need for CRRT, would be interested  in this if necessary P: Follow renal function  Monitor urine output Trend Bmet Avoid nephrotoxins Ensure adequate renal perfusion   DMII  P: Continue resistant scale SSI Continue long-acting insulin  Add tube feed coverage CBG goal 1 4180 CBG checks every 4  Hypokalemia  P: Supplement   Hyperglycemia Hypophosphoremia  P: Supplement  Best Practice (right click and Reselect all  SmartList Selections daily)   Diet/type: NPO DVT prophylaxis LMWH GI prophylaxis: PPI Lines: PICC line, A-line Foley:  Yes, and it is still needed Code Status:  full code Last date of multidisciplinary goals of care discussion [8/27 - D/w patient's daughter, Morgan Fischer, via phone; would like patient to remain full code at present, consented to intubated, would want CRRT if necessary; updated via phone 8/28]  Critical care time:   CRITICAL CARE Performed by: Marra Fraga D. Harris   Total critical care time: 37 minutes  Critical care time was exclusive of separately billable procedures and treating other patients.  Critical care was necessary to treat or prevent imminent or life-threatening deterioration.  Critical care was time spent personally by me on the following activities: development of treatment plan with patient and/or surrogate as well as nursing, discussions with consultants, evaluation of patient's response to treatment, examination of patient, obtaining history from patient or surrogate, ordering and performing treatments and interventions, ordering and review of laboratory studies, ordering and review of radiographic studies, pulse oximetry and re-evaluation of patient's condition.  Garland Hincapie D. Harris, NP-C [facility] Personal contact information can be found on Amion  If no contact or response made please call [phone] 08/31/2023, 8:24 AM

## 2023-09-01 DIAGNOSIS — J9622 Acute and chronic respiratory failure with hypercapnia: Secondary | ICD-10-CM | POA: Diagnosis not present

## 2023-09-01 DIAGNOSIS — I421 Obstructive hypertrophic cardiomyopathy: Secondary | ICD-10-CM | POA: Diagnosis not present

## 2023-09-01 DIAGNOSIS — Z7189 Other specified counseling: Secondary | ICD-10-CM | POA: Diagnosis not present

## 2023-09-01 DIAGNOSIS — Z515 Encounter for palliative care: Secondary | ICD-10-CM | POA: Diagnosis not present

## 2023-09-01 DIAGNOSIS — J9621 Acute and chronic respiratory failure with hypoxia: Secondary | ICD-10-CM | POA: Diagnosis not present

## 2023-09-01 DIAGNOSIS — J441 Chronic obstructive pulmonary disease with (acute) exacerbation: Secondary | ICD-10-CM | POA: Diagnosis not present

## 2023-09-01 DIAGNOSIS — U071 COVID-19: Secondary | ICD-10-CM | POA: Diagnosis not present

## 2023-09-01 LAB — COMPREHENSIVE METABOLIC PANEL WITH GFR
ALT: 50 U/L — ABNORMAL HIGH (ref 0–44)
AST: 63 U/L — ABNORMAL HIGH (ref 15–41)
Albumin: 2.8 g/dL — ABNORMAL LOW (ref 3.5–5.0)
Alkaline Phosphatase: 48 U/L (ref 38–126)
Anion gap: 12 (ref 5–15)
BUN: 77 mg/dL — ABNORMAL HIGH (ref 8–23)
CO2: 34 mmol/L — ABNORMAL HIGH (ref 22–32)
Calcium: 8.4 mg/dL — ABNORMAL LOW (ref 8.9–10.3)
Chloride: 100 mmol/L (ref 98–111)
Creatinine, Ser: 1.55 mg/dL — ABNORMAL HIGH (ref 0.44–1.00)
GFR, Estimated: 35 mL/min — ABNORMAL LOW (ref >60)
Glucose, Bld: 275 mg/dL — ABNORMAL HIGH (ref 70–99)
Potassium: 3.9 mmol/L (ref 3.5–5.1)
Sodium: 145 mmol/L (ref 135–145)
Total Bilirubin: 0.5 mg/dL (ref 0.0–1.2)
Total Protein: 5.6 g/dL — ABNORMAL LOW (ref 6.5–8.1)

## 2023-09-01 LAB — ACID FAST SMEAR (AFB, MYCOBACTERIA): Acid Fast Smear: NEGATIVE

## 2023-09-01 LAB — GLUCOSE, CAPILLARY
Glucose-Capillary: 136 mg/dL — ABNORMAL HIGH (ref 70–99)
Glucose-Capillary: 171 mg/dL — ABNORMAL HIGH (ref 70–99)
Glucose-Capillary: 195 mg/dL — ABNORMAL HIGH (ref 70–99)
Glucose-Capillary: 210 mg/dL — ABNORMAL HIGH (ref 70–99)
Glucose-Capillary: 214 mg/dL — ABNORMAL HIGH (ref 70–99)
Glucose-Capillary: 250 mg/dL — ABNORMAL HIGH (ref 70–99)
Glucose-Capillary: 278 mg/dL — ABNORMAL HIGH (ref 70–99)

## 2023-09-01 LAB — CBC WITH DIFFERENTIAL/PLATELET
Abs Immature Granulocytes: 0.57 K/uL — ABNORMAL HIGH (ref 0.00–0.07)
Basophils Absolute: 0 K/uL (ref 0.0–0.1)
Basophils Relative: 0 %
Eosinophils Absolute: 0 K/uL (ref 0.0–0.5)
Eosinophils Relative: 0 %
HCT: 29.2 % — ABNORMAL LOW (ref 36.0–46.0)
Hemoglobin: 8.4 g/dL — ABNORMAL LOW (ref 12.0–15.0)
Immature Granulocytes: 4 %
Lymphocytes Relative: 4 %
Lymphs Abs: 0.5 K/uL — ABNORMAL LOW (ref 0.7–4.0)
MCH: 25.6 pg — ABNORMAL LOW (ref 26.0–34.0)
MCHC: 28.8 g/dL — ABNORMAL LOW (ref 30.0–36.0)
MCV: 89 fL (ref 80.0–100.0)
Monocytes Absolute: 0.6 K/uL (ref 0.1–1.0)
Monocytes Relative: 4 %
Neutro Abs: 12.4 K/uL — ABNORMAL HIGH (ref 1.7–7.7)
Neutrophils Relative %: 88 %
Platelets: 112 K/uL — ABNORMAL LOW (ref 150–400)
RBC: 3.28 MIL/uL — ABNORMAL LOW (ref 3.87–5.11)
RDW: 18.1 % — ABNORMAL HIGH (ref 11.5–15.5)
WBC: 14.1 K/uL — ABNORMAL HIGH (ref 4.0–10.5)
nRBC: 0.4 % — ABNORMAL HIGH (ref 0.0–0.2)

## 2023-09-01 LAB — MAGNESIUM: Magnesium: 2.8 mg/dL — ABNORMAL HIGH (ref 1.7–2.4)

## 2023-09-01 LAB — PHOSPHORUS: Phosphorus: 4.2 mg/dL (ref 2.5–4.6)

## 2023-09-01 MED ORDER — LINEZOLID 600 MG PO TABS
600.0000 mg | ORAL_TABLET | Freq: Two times a day (BID) | ORAL | Status: DC
  Administered 2023-09-02 – 2023-09-04 (×5): 600 mg
  Filled 2023-09-01 (×6): qty 1

## 2023-09-01 MED ORDER — SODIUM CHLORIDE 0.9 % IV SOLN
1.0000 g | Freq: Two times a day (BID) | INTRAVENOUS | Status: AC
  Administered 2023-09-01 – 2023-09-05 (×9): 1 g via INTRAVENOUS
  Filled 2023-09-01 (×9): qty 20

## 2023-09-01 NOTE — Plan of Care (Signed)
 Patient currently on a ventilator, see lab history for results, patient has fever at this time, ice packs applied, cooling blanket order. All lines and tubes patent, IVF infusing well. Plan of care reviewed, patient daughter called and she was updated. Bed in lowest locked position with call bell in place, side rails up and bed alarm on.  Problem: Education: Goal: Knowledge of risk factors and measures for prevention of condition will improve Outcome: Progressing   Problem: Coping: Goal: Psychosocial and spiritual needs will be supported Outcome: Progressing   Problem: Respiratory: Goal: Complications related to the disease process, condition or treatment will be avoided or minimized Outcome: Progressing   Problem: Education: Goal: Knowledge of General Education information will improve Description: Including pain rating scale, medication(s)/side effects and non-pharmacologic comfort measures Outcome: Progressing   Problem: Health Behavior/Discharge Planning: Goal: Ability to manage health-related needs will improve Outcome: Progressing   Problem: Clinical Measurements: Goal: Ability to maintain clinical measurements within normal limits will improve Outcome: Progressing Goal: Respiratory complications will improve Outcome: Progressing Goal: Cardiovascular complication will be avoided Outcome: Progressing   Problem: Activity: Goal: Risk for activity intolerance will decrease Outcome: Progressing   Problem: Nutrition: Goal: Adequate nutrition will be maintained Outcome: Progressing   Problem: Coping: Goal: Level of anxiety will decrease Outcome: Progressing   Problem: Elimination: Goal: Will not experience complications related to bowel motility Outcome: Progressing Goal: Will not experience complications related to urinary retention Outcome: Progressing   Problem: Pain Managment: Goal: General experience of comfort will improve and/or be controlled Outcome:  Progressing   Problem: Safety: Goal: Ability to remain free from injury will improve Outcome: Progressing   Problem: Skin Integrity: Goal: Risk for impaired skin integrity will decrease Outcome: Progressing   Problem: Education: Goal: Ability to describe self-care measures that may prevent or decrease complications (Diabetes Survival Skills Education) will improve Outcome: Progressing Goal: Individualized Educational Video(s) Outcome: Progressing   Problem: Coping: Goal: Ability to adjust to condition or change in health will improve Outcome: Progressing   Problem: Fluid Volume: Goal: Ability to maintain a balanced intake and output will improve Outcome: Progressing   Problem: Health Behavior/Discharge Planning: Goal: Ability to identify and utilize available resources and services will improve Outcome: Progressing Goal: Ability to manage health-related needs will improve Outcome: Progressing   Problem: Metabolic: Goal: Ability to maintain appropriate glucose levels will improve Outcome: Progressing   Problem: Nutritional: Goal: Maintenance of adequate nutrition will improve Outcome: Progressing Goal: Progress toward achieving an optimal weight will improve Outcome: Progressing   Problem: Skin Integrity: Goal: Risk for impaired skin integrity will decrease Outcome: Progressing   Problem: Tissue Perfusion: Goal: Adequacy of tissue perfusion will improve Outcome: Progressing   Problem: Activity: Goal: Ability to tolerate increased activity will improve Outcome: Progressing   Problem: Respiratory: Goal: Ability to maintain a clear airway and adequate ventilation will improve Outcome: Progressing   Problem: Role Relationship: Goal: Method of communication will improve Outcome: Progressing

## 2023-09-01 NOTE — Progress Notes (Signed)
  Daily Progress Note   Patient Name: Morgan Fischer       Date: 09/01/2023 DOB: 1950-12-03  Age: 73 y.o. MRN#: [id number] Attending Physician: Neda Jennet LABOR, MD Primary Care Physician: Antonio Meth, [name] SAUNDERS, DO Admit Date: 08/26/2023 Length of Stay: 6 days  Reason for Consultation/Follow-up: Establishing goals of care  Subjective:   CC: Patient remains intubated and sedated.  Following up regarding complex medical decision making.  Subjective:  Reviewed EMR including recent documentation from [facility] provider.  Patient continues to receive broad-spectrum antibiotics.  While patient is easily able to arouse, unable to wean due to desaturations and oxygen .  Concerned about limited pulmonary reserve at baseline.  When seen patient, remains intubated and sedated.  No visitors present at bedside.  Discussed care with RN for updates.  Objective:   Vital Signs:  BP (!) 100/29 (BP Location: Right Arm)   Pulse 76   Temp (!) 101.1 F (38.4 C)   Resp 19   Ht 5' 3 (1.6 m)   Wt 134.5 kg   LMP  (LMP Unknown)   SpO2 96%   BMI 52.53 kg/m   Physical Exam: General: Sedated on ventilator support Cardiovascular: Bradycardia noted Respiratory: Intubated on ventilator support  Neuro: Sedated  Assessment & Plan:   Assessment: Patient is a 73 year old female with a past medical history of chronic hypoxic and hypercapnic respiratory failure on 2 L nasal cannula and daytime and nighttime BiPAP, HOCM, HFpEF, COPD, diabetes mellitus type 2, hyperlipidemia, and CKD who was admitted on 08/26/2023 for management of shortness of breath and cough.  During hospitalization, patient required BiPAP support for acute on chronic hypoxic and hypercapnic respiratory failure in setting of COVID-19 infection diagnosis and heart failure and COPD exacerbation.  Patient was transferred to stepdown unit on 08/28/2023 due to worsening respiratory status.  Palliative medicine team consulted to assist with complex medical  decision making.   Recommendations/Plan: # Complex medical decision making/goals of care:   - Unable to discuss care planning with patient due to medical condition.  Care team has already discussed care planning with daughter and continuing aggressive medical interventions at this time.  Daughter noted to also have COVID so currently unable to come to bedside.  Noted palliative medicine team would continue to follow along to engage in conversations as able and appropriate.                -  Code Status: Full Code    # Psycho-social/Spiritual Support:  - Support System: Daughter/NOK in EMR- [name] Steven   # Discharge Planning:  To Be Determined  Thank you for allowing the palliative care team to participate in the care [facility].  Tinnie Radar, DO Palliative Care Provider PMT # [phone]  If patient remains symptomatic despite maximum doses, please call PMT at [phone] between 0700 and 1900. Outside of these hours, please call attending, as PMT does not have night coverage.

## 2023-09-01 NOTE — Progress Notes (Signed)
   09/01/23 0748  Daily Weaning Assessment  Daily Assessment of Readiness to Wean (S)  Wean protocol criteria not met  Reason not met (S)  Fi02 > 40%;PEEP > 8

## 2023-09-01 NOTE — Progress Notes (Signed)
 Bedside Bronchoscopy Procedure Note Morgan Fischer [id number] [birth year]  Procedure: Bronchoscopy Indications: Diagnostic evaluation of the airways, Obtain specimens for culture and/or other diagnostic studies, and Remove secretions  Procedure Details: ET Tube Size: 7.5 ET Tube secured at lip (cm): 22 Bite block in place: No In preparation for procedure, Patient hyper-oxygenated with 100 % FiO2 Airway entered and the following bronchi were examined: Bronchi.   Bronchoscope removed.  Patient placed back on 100% FiO2 at conclusion of procedure.    Evaluation BP (!) 100/29 (BP Location: Right Arm)   Pulse 77   Temp (!) 102.2 F (39 C)   Resp 20   Ht 5' 3 (1.6 m)   Wt 134.5 kg   LMP  (LMP Unknown)   SpO2 97%   BMI 52.53 kg/m  Breath Sounds:Rhonch and Coarse crackles O2 sats: stable throughout and currently acceptable Patient's Current Condition: stable Specimens:  Sent purulent fluid Complications: No apparent complications Patient did tolerate procedure well.   [name] 09/01/2023, 6:35 PM

## 2023-09-01 NOTE — Progress Notes (Signed)
 NAME:  Morgan Fischer, MRN:  [id number], DOB:  Nov 30, 1950, LOS: 6 ADMISSION DATE:  08/26/2023, CONSULTATION DATE: 08/26/2023 REFERRING MD: Redia - TRH, CHIEF COMPLAINT: Acute on chronic hypoxemic/hypercarbic respiratory failure  History of Present Illness:  Patient with known history of chronic respiratory failure on BiPAP at night for hypercapnic respiratory failure, history of chronic diastolic congestive heart failure Transferred [facility] where she had presented with worsening shortness of breath of about 5 days duration She has had a cough, no sputum production, no chest pain or chest discomfort No fevers or chills No contact with anyone with a febrile illness No change in bowel habits, no dysuria  Recently followed up at [facility] last Wednesday and prior to that visit was feeling fine but she felt that there may have been people who was sick in the waiting area  Normally on 2 L of oxygen  during the day but in the last few weeks oxygen  levels were increased to 3 L because of desaturations  History of hypertrophic cardiomyopathy, chronic kidney disease stage IIIa, type 2 diabetes asthma/COPD, morbid obesity, usual activity tolerance about 20-30 steps, never smoker  Has been compliant with her usual medications  Tested positive for COVID but does not recollect being around anybody with acute febrile illness  Pertinent Medical History:   Past Medical History:  Diagnosis Date   Acute diastolic heart failure (HCC) 07/17/2019   Acute hypercapnic respiratory failure (HCC) 07/21/2016   Acute on chronic congestive heart failure (HCC) 07/19/2016   Acute on chronic diastolic CHF (congestive heart failure) (HCC) 03/08/2020   Acute on chronic respiratory failure with hypoxia (HCC) 10/20/2018   Acute on chronic respiratory failure with hypoxia and hypercapnia (HCC) 03/08/2020   Acute respiratory acidosis (HCC) 07/19/2016   Acute respiratory distress 07/19/2016   Acute respiratory  failure (HCC) 03/08/2020   Anxiety 03/15/2020   Aortic stenosis 11/01/2018   Arthritis    Asthma    Atherosclerosis of native coronary artery of native heart without angina pectoris 03/17/2016   Cardiomyopathy (HCC) 03/15/2020   Chest pain 07/14/2019   CHF (congestive heart failure) (HCC)    Chronic diastolic heart failure (HCC) 03/17/2016   Chronic hypoxemic respiratory failure (HCC)    Chronic kidney disease, stage 3b (HCC) 02/04/2021   CKD (chronic kidney disease)    COPD (chronic obstructive pulmonary disease) (HCC)    COPD with acute exacerbation (HCC) 03/08/2020   Diabetes mellitus without complication (HCC)    Drug allergy 07/21/2016   Essential (primary) hypertension 11/19/2016   Essential hypertension 11/19/2016   Gout 11/19/2016   Heart block AV complete (HCC)    High cholesterol    History of gout 04/05/2023   HOCM (hypertrophic obstructive cardiomyopathy) (HCC) 09/18/2017   Hyperlipidemia associated with type 2 diabetes mellitus (HCC) 11/23/2022   Hyperlipidemia, unspecified 03/15/2020   Hypertension    Hypertensive heart disease with heart failure (HCC) 03/17/2016   Hypertensive heart disease without congestive heart failure 03/15/2020   Hypertrophic cardiomyopathy (HCC) 04/12/2020   Hypokalemia 08/24/2015   Hypomagnesemia    Hypoxia 03/17/2016   Insomnia 11/23/2022   Iron deficiency 03/15/2020   Leukocytosis 07/19/2016   Long term (current) use of insulin  (HCC) 02/04/2021   Mild intermittent asthma 02/04/2021   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   Mixed hyperlipidemia 03/15/2020   Morbid obesity (HCC)    Morbid obesity with BMI of 60.0-69.9, adult (HCC) 07/21/2016   Need for influenza vaccination 11/23/2022   Need for pneumococcal 20-valent conjugate vaccination 11/23/2022  Nonrheumatic aortic valve stenosis 03/17/2016   Obesity hypoventilation syndrome (HCC) 03/08/2020   Obstructive sleep apnea 11/19/2016   Osteoarthritis of knee 03/15/2020   Panniculitis  08/23/2015   Peripheral venous insufficiency 03/15/2020   Pure hypercholesterolemia 02/13/2017   SOB (shortness of breath) 08/24/2015   Troponin level elevated 08/24/2015   Type 2 diabetes mellitus with hyperglycemia (HCC) 07/19/2016   Type 2 diabetes mellitus with stage 3 chronic kidney disease, with long-term current use of insulin  (HCC) 11/19/2016   Type 2 diabetes mellitus without complication, without long-term current use of insulin  (HCC) 04/12/2020   Type 2 diabetes mellitus without complications (HCC) 03/15/2020   Type 2 diabetes mellitus, without long-term current use of insulin  (HCC) 11/19/2016   Significant Hospital Events: Including procedures, antibiotic start and stop dates in addition to other pertinent events   8/24 CXR with pulmonary vascular congestion 8/25 No acute issues overnight, wore BIPAP. pCO2 remains elevated to 100. Wakes up appropriately but dozes back off. 8/27 intubated  8/28 Overnight, anisocoria noted prompted CT Head . Negative  8/29 some issues with hypertension overnight   Interim History / Subjective:  Arouses easily Was unable to wean this morning with desaturations  Objective:    Blood pressure (!) 100/29, pulse 79, temperature 100 F (37.8 C), resp. rate (!) 21, height 5' 3 (1.6 m), weight 134.5 kg, SpO2 97%.    Vent Mode: PRVC FiO2 (%):  [35 %-60 %] 45 % Set Rate:  [24 bmp] 24 bmp Vt Set:  [410 mL] 410 mL PEEP:  [8 cmH20] 8 cmH20 Plateau Pressure:  [25 cmH20-31 cmH20] 31 cmH20   Intake/Output Summary (Last 24 hours) at 09/01/2023 0906 Last data filed at 09/01/2023 9342 Gross per 24 hour  Intake 2474.98 ml  Output 600 ml  Net 1874.98 ml   Filed Weights   08/30/23 0500 08/31/23 0406 09/01/23 0500  Weight: 131.5 kg 134.5 kg 134.5 kg   Physical Examination: General: Acute on chronically ill-appearing  HEENT: Endotracheal tube in place moist oral mucosa Neuro: Arousable, responsive to questions CV: S1-S2 appreciated PULM: Clear  breath sounds  GI: Soft, bowel sounds appreciated Extremities: warm/dry, no edema  Skin: no rashes or lesions  I reviewed last 24 h vitals and pain scores, last 48 h intake and output, last 24 h labs and trends, and last 24 h imaging results.  Resolved Problem List:  Shock   Assessment and Plan:   Acute on chronic hypoxemic/hypercapnic respiratory failure COVID-positive COPD with exacerbation - Intubated for hypercapnic respiratory failure 8/27 -No organisms from BAL - Continue mechanical ventilation -Target TVol 6-8cc/kgIBW -Target Plateau Pressure < 30cm H20 -Target driving pressure less than 15 cm of water  -Target PaO2 55-65: titrate PEEP/FiO2 per protocol -Ventilator associated pneumonia prevention protocol - Weaning as tolerated  Hypertrophic obstructive cardiomyopathy - Echo 8/25 with ejection fraction of 70-75%, hyperdynamic function, severe concentric LVH, grade 2 diastolic dysfunction with mildly enlarged RV - Continue monitoring - Continue telemetry - Cautious diuresis as needed - On aspirin  and statin  Acute kidney injury on chronic kidney disease stage IIIa - Optimize renal perfusion - Trend electrolytes - Replete electrolytes - Avoid nephrotoxic medications  Type 2 diabetes - Continue resistant scale SSI - Goal CBG 140-180  Risk of decompensation remains very high  Not weaning successfully at present Limited pulmonary reserve at baseline  Best Practice (right click and Reselect all SmartList Selections daily)   Diet/type: Tube feeds DVT prophylaxis LMWH GI prophylaxis: PPI Lines: PICC line, A-line Foley:  Yes,  and it is still needed Code Status:  full code Last date of multidisciplinary goals of care discussion [8/27 - D/w patient's daughter, [name], via phone; would like patient to remain full code at present, consented to intubated, would want CRRT if necessary; updated via phone 8/28]  Critical care time:   The patient is critically ill  with multiple organ systems failure and requires high complexity decision making for assessment and support, frequent evaluation and titration of therapies, application of advanced monitoring technologies and extensive interpretation of multiple databases. Critical Care Time devoted to patient care services described in this note independent of APP/resident time (if applicable)  is 32 minutes.   Jennet Epley MD [place] [facility] Personal pager: See Amion If unanswered, please page CCM On-call: #[phone]

## 2023-09-01 NOTE — Progress Notes (Signed)
 Fever of 102  Ice packs placed  Order for blood cultures urine and tracheal aspirate  Switch Zosyn  to meropenem , continue linezolid   Source is unclear  Recent BAL negative

## 2023-09-02 ENCOUNTER — Inpatient Hospital Stay (HOSPITAL_COMMUNITY)

## 2023-09-02 DIAGNOSIS — J9610 Chronic respiratory failure, unspecified whether with hypoxia or hypercapnia: Secondary | ICD-10-CM | POA: Diagnosis not present

## 2023-09-02 DIAGNOSIS — J9621 Acute and chronic respiratory failure with hypoxia: Secondary | ICD-10-CM | POA: Diagnosis not present

## 2023-09-02 DIAGNOSIS — I517 Cardiomegaly: Secondary | ICD-10-CM | POA: Diagnosis not present

## 2023-09-02 DIAGNOSIS — J441 Chronic obstructive pulmonary disease with (acute) exacerbation: Secondary | ICD-10-CM | POA: Diagnosis not present

## 2023-09-02 DIAGNOSIS — J90 Pleural effusion, not elsewhere classified: Secondary | ICD-10-CM | POA: Diagnosis not present

## 2023-09-02 DIAGNOSIS — U071 COVID-19: Secondary | ICD-10-CM | POA: Diagnosis not present

## 2023-09-02 DIAGNOSIS — J9622 Acute and chronic respiratory failure with hypercapnia: Secondary | ICD-10-CM | POA: Diagnosis not present

## 2023-09-02 DIAGNOSIS — I5032 Chronic diastolic (congestive) heart failure: Secondary | ICD-10-CM | POA: Diagnosis not present

## 2023-09-02 LAB — URINALYSIS, W/ REFLEX TO CULTURE (INFECTION SUSPECTED)
Bilirubin Urine: NEGATIVE
Glucose, UA: NEGATIVE mg/dL
Ketones, ur: NEGATIVE mg/dL
Leukocytes,Ua: NEGATIVE
Nitrite: NEGATIVE
Protein, ur: 30 mg/dL — AB
RBC / HPF: >50 RBC/hpf (ref 0–5)
Specific Gravity, Urine: 1.016 (ref 1.005–1.030)
WBC, UA: >50 WBC/hpf (ref 0–5)
pH: 5 (ref 5.0–8.0)

## 2023-09-02 LAB — GLUCOSE, CAPILLARY
Glucose-Capillary: 172 mg/dL — ABNORMAL HIGH (ref 70–99)
Glucose-Capillary: 186 mg/dL — ABNORMAL HIGH (ref 70–99)
Glucose-Capillary: 213 mg/dL — ABNORMAL HIGH (ref 70–99)
Glucose-Capillary: 220 mg/dL — ABNORMAL HIGH (ref 70–99)
Glucose-Capillary: 231 mg/dL — ABNORMAL HIGH (ref 70–99)
Glucose-Capillary: 252 mg/dL — ABNORMAL HIGH (ref 70–99)
Glucose-Capillary: 254 mg/dL — ABNORMAL HIGH (ref 70–99)

## 2023-09-02 LAB — COMPREHENSIVE METABOLIC PANEL WITH GFR
ALT: 66 U/L — ABNORMAL HIGH (ref 0–44)
AST: 75 U/L — ABNORMAL HIGH (ref 15–41)
Albumin: 2.7 g/dL — ABNORMAL LOW (ref 3.5–5.0)
Alkaline Phosphatase: 41 U/L (ref 38–126)
Anion gap: 13 (ref 5–15)
BUN: 95 mg/dL — ABNORMAL HIGH (ref 8–23)
CO2: 30 mmol/L (ref 22–32)
Calcium: 7.7 mg/dL — ABNORMAL LOW (ref 8.9–10.3)
Chloride: 101 mmol/L (ref 98–111)
Creatinine, Ser: 1.87 mg/dL — ABNORMAL HIGH (ref 0.44–1.00)
GFR, Estimated: 28 mL/min — ABNORMAL LOW (ref >60)
Glucose, Bld: 259 mg/dL — ABNORMAL HIGH (ref 70–99)
Potassium: 4.2 mmol/L (ref 3.5–5.1)
Sodium: 144 mmol/L (ref 135–145)
Total Bilirubin: 0.4 mg/dL (ref 0.0–1.2)
Total Protein: 5.4 g/dL — ABNORMAL LOW (ref 6.5–8.1)

## 2023-09-02 LAB — CREATININE, SERUM
Creatinine, Ser: 1.92 mg/dL — ABNORMAL HIGH (ref 0.44–1.00)
GFR, Estimated: 27 mL/min — ABNORMAL LOW (ref >60)

## 2023-09-02 LAB — CBC WITH DIFFERENTIAL/PLATELET
Abs Immature Granulocytes: 0.5 K/uL — ABNORMAL HIGH (ref 0.00–0.07)
Basophils Absolute: 0 K/uL (ref 0.0–0.1)
Basophils Relative: 0 %
Eosinophils Absolute: 0 K/uL (ref 0.0–0.5)
Eosinophils Relative: 0 %
HCT: 27.6 % — ABNORMAL LOW (ref 36.0–46.0)
Hemoglobin: 8 g/dL — ABNORMAL LOW (ref 12.0–15.0)
Immature Granulocytes: 3 %
Lymphocytes Relative: 4 %
Lymphs Abs: 0.6 K/uL — ABNORMAL LOW (ref 0.7–4.0)
MCH: 26.7 pg (ref 26.0–34.0)
MCHC: 29 g/dL — ABNORMAL LOW (ref 30.0–36.0)
MCV: 92 fL (ref 80.0–100.0)
Monocytes Absolute: 0.6 K/uL (ref 0.1–1.0)
Monocytes Relative: 4 %
Neutro Abs: 13.1 K/uL — ABNORMAL HIGH (ref 1.7–7.7)
Neutrophils Relative %: 89 %
Platelets: 111 K/uL — ABNORMAL LOW (ref 150–400)
RBC: 3 MIL/uL — ABNORMAL LOW (ref 3.87–5.11)
RDW: 18.3 % — ABNORMAL HIGH (ref 11.5–15.5)
WBC: 14.8 K/uL — ABNORMAL HIGH (ref 4.0–10.5)
nRBC: 0.3 % — ABNORMAL HIGH (ref 0.0–0.2)

## 2023-09-02 LAB — TRIGLYCERIDES: Triglycerides: 159 mg/dL — ABNORMAL HIGH (ref <150)

## 2023-09-02 LAB — PHOSPHORUS: Phosphorus: 5.9 mg/dL — ABNORMAL HIGH (ref 2.5–4.6)

## 2023-09-02 LAB — MAGNESIUM: Magnesium: 3 mg/dL — ABNORMAL HIGH (ref 1.7–2.4)

## 2023-09-02 MED ORDER — INSULIN GLARGINE 100 UNIT/ML ~~LOC~~ SOLN
15.0000 [IU] | Freq: Every day | SUBCUTANEOUS | Status: DC
  Administered 2023-09-02 – 2023-09-03 (×2): 15 [IU] via SUBCUTANEOUS
  Filled 2023-09-02 (×2): qty 0.15

## 2023-09-02 MED ORDER — LACTATED RINGERS IV BOLUS
500.0000 mL | Freq: Once | INTRAVENOUS | Status: AC
  Administered 2023-09-02: 500 mL via INTRAVENOUS

## 2023-09-02 NOTE — Progress Notes (Addendum)
 NAME:  Morgan Fischer, MRN:  [id number], DOB:  Apr 02, 1950, LOS: 7 ADMISSION DATE:  08/26/2023, CONSULTATION DATE: 08/26/2023 REFERRING MD: Redia - TRH, CHIEF COMPLAINT: Acute on chronic hypoxemic/hypercarbic respiratory failure  History of Present Illness:  Patient with known history of chronic respiratory failure on BiPAP at night for hypercapnic respiratory failure, history of chronic diastolic congestive heart failure Transferred from med [facility] where she had presented with worsening shortness of breath of about 5 days duration She has had a cough, no sputum production, no chest pain or chest discomfort No fevers or chills No contact with anyone with a febrile illness No change in bowel habits, no dysuria  Recently followed up at [facility] last Wednesday and prior to that visit was feeling fine but she felt that there may have been people who was sick in the waiting area  Normally on 2 L of oxygen  during the day but in the last few weeks oxygen  levels were increased to 3 L because of desaturations  History of hypertrophic cardiomyopathy, chronic kidney disease stage IIIa, type 2 diabetes asthma/COPD, morbid obesity, usual activity tolerance about 20-30 steps, never smoker  Has been compliant with her usual medications  Tested positive for COVID but does not recollect being around anybody with acute febrile illness  Pertinent Medical History:   Past Medical History:  Diagnosis Date   Acute diastolic heart failure (HCC) 07/17/2019   Acute hypercapnic respiratory failure (HCC) 07/21/2016   Acute on chronic congestive heart failure (HCC) 07/19/2016   Acute on chronic diastolic CHF (congestive heart failure) (HCC) 03/08/2020   Acute on chronic respiratory failure with hypoxia (HCC) 10/20/2018   Acute on chronic respiratory failure with hypoxia and hypercapnia (HCC) 03/08/2020   Acute respiratory acidosis (HCC) 07/19/2016   Acute respiratory distress 07/19/2016   Acute respiratory  failure (HCC) 03/08/2020   Anxiety 03/15/2020   Aortic stenosis 11/01/2018   Arthritis    Asthma    Atherosclerosis of native coronary artery of native heart without angina pectoris 03/17/2016   Cardiomyopathy (HCC) 03/15/2020   Chest pain 07/14/2019   CHF (congestive heart failure) (HCC)    Chronic diastolic heart failure (HCC) 03/17/2016   Chronic hypoxemic respiratory failure (HCC)    Chronic kidney disease, stage 3b (HCC) 02/04/2021   CKD (chronic kidney disease)    COPD (chronic obstructive pulmonary disease) (HCC)    COPD with acute exacerbation (HCC) 03/08/2020   Diabetes mellitus without complication (HCC)    Drug allergy 07/21/2016   Essential (primary) hypertension 11/19/2016   Essential hypertension 11/19/2016   Gout 11/19/2016   Heart block AV complete (HCC)    High cholesterol    History of gout 04/05/2023   HOCM (hypertrophic obstructive cardiomyopathy) (HCC) 09/18/2017   Hyperlipidemia associated with type 2 diabetes mellitus (HCC) 11/23/2022   Hyperlipidemia, unspecified 03/15/2020   Hypertension    Hypertensive heart disease with heart failure (HCC) 03/17/2016   Hypertensive heart disease without congestive heart failure 03/15/2020   Hypertrophic cardiomyopathy (HCC) 04/12/2020   Hypokalemia 08/24/2015   Hypomagnesemia    Hypoxia 03/17/2016   Insomnia 11/23/2022   Iron deficiency 03/15/2020   Leukocytosis 07/19/2016   Long term (current) use of insulin  (HCC) 02/04/2021   Mild intermittent asthma 02/04/2021   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   Mixed hyperlipidemia 03/15/2020   Morbid obesity (HCC)    Morbid obesity with BMI of 60.0-69.9, adult (HCC) 07/21/2016   Need for influenza vaccination 11/23/2022   Need for pneumococcal 20-valent conjugate vaccination 11/23/2022  Nonrheumatic aortic valve stenosis 03/17/2016   Obesity hypoventilation syndrome (HCC) 03/08/2020   Obstructive sleep apnea 11/19/2016   Osteoarthritis of knee 03/15/2020   Panniculitis  08/23/2015   Peripheral venous insufficiency 03/15/2020   Pure hypercholesterolemia 02/13/2017   SOB (shortness of breath) 08/24/2015   Troponin level elevated 08/24/2015   Type 2 diabetes mellitus with hyperglycemia (HCC) 07/19/2016   Type 2 diabetes mellitus with stage 3 chronic kidney disease, with long-term current use of insulin  (HCC) 11/19/2016   Type 2 diabetes mellitus without complication, without long-term current use of insulin  (HCC) 04/12/2020   Type 2 diabetes mellitus without complications (HCC) 03/15/2020   Type 2 diabetes mellitus, without long-term current use of insulin  (HCC) 11/19/2016   Significant Hospital Events: Including procedures, antibiotic start and stop dates in addition to other pertinent events   8/24 CXR with pulmonary vascular congestion 8/25 No acute issues overnight, wore BIPAP. pCO2 remains elevated to 100. Wakes up appropriately but dozes back off. 8/27 intubated  8/28 Overnight, anisocoria noted prompted CT Head . Negative  8/29 some issues with hypertension overnight  8/30-fever 102, cultures drawn-escalated antibiotics   Antibiotics: Ceftriaxone  8/24-8/25 Doxycycline  8/24-8/29 Linezolid  8/27>> Zosyn  8/26-8/30 Meropenem  8/30>>  BAL 8/27-no organisms Blood culture 8/30-no organisms  Interim History / Subjective:  She does arouse to name calling Failed weaning this morning with hypotension, desaturations Fever of 102 last evening  Objective:    Blood pressure (!) 100/29, pulse 70, temperature 99.1 F (37.3 C), resp. rate (!) 24, height 5' 3 (1.6 m), weight (!) 141.9 kg, SpO2 95%.    Vent Mode: PRVC FiO2 (%):  [45 %] 45 % Set Rate:  [24 bmp] 24 bmp Vt Set:  [41 mL-410 mL] 410 mL PEEP:  [5 cmH20-8 cmH20] 6 cmH20 Pressure Support:  [17 cmH20] 17 cmH20 Plateau Pressure:  [25 cmH20-35 cmH20] 30 cmH20   Intake/Output Summary (Last 24 hours) at 09/02/2023 0945 Last data filed at 09/02/2023 0800 Gross per 24 hour  Intake 2852.23 ml   Output 2100 ml  Net 752.23 ml   Filed Weights   08/31/23 0406 09/01/23 0500 09/02/23 0533  Weight: 134.5 kg 134.5 kg (!) 141.9 kg   Physical Examination: General: Acute on chronically ill-appearing HEENT: Endotracheal tube in place, moist oral mucosa Neuro: She is arousable to name calling, unable to follow commands CV:  S1-S2 appreciated PULM: Decreased breath sounds at the bases bilaterally GI: Soft, bowel sounds appreciated Extremities: Warm and dry Skin: no rashes or lesions  I reviewed last 24 h vitals and pain scores, last 48 h intake and output, last 24 h labs and trends, and last 24 h imaging results.  Resolved Problem List:  Shock   Assessment and Plan:   Acute on chronic hypoxemic/hypercapnic respiratory failure COVID-positive COPD with exacerbation - Intubated for hypercapnic respiratory failure on 8/27 - Continue mechanical ventilation -Target TVol 6-8cc/kgIBW -Target Plateau Pressure < 30cm H20 -Target driving pressure less than 15 cm of water  -Target PaO2 55-65: titrate PEEP/FiO2 per protocol -Ventilator associated pneumonia prevention protocol -Continue weaning as tolerated, now weaning well at present  Hypertrophic obstructive cardiomyopathy Chronic diastolic heart failure - Echocardiogram 8/25 with ejection fraction of 70-75%, hyperdynamic function, severe concentric LVH, grade 2 diastolic dysfunction with mildly enlarged right ventricle - Continue telemetry monitoring - Continue Norpace  - Cautious diuresis as tolerated - Continue aspirin  and statin  Acute kidney injury on chronic kidney disease stage IIIa - Continue to trend electrolytes - Avoid nephrotoxic medications  Type 2 diabetes -  Continue resistant scale SSI - Goal CBG 140-180  Risk of decompensation remains very high Patient has limited pulmonary reserve at baseline Not weaning successfully at present  Discussed with patient's sister and brother   Best Practice (right click and  Reselect all SmartList Selections daily)   Diet/type: Tube feeds DVT prophylaxis LMWH GI prophylaxis: PPI Lines: PICC line, A-line Foley:  Yes, and it is still needed Code Status:  full code Last date of multidisciplinary goals of care discussion [8/27 - D/w patient's daughter, [name], via phone; would like patient to remain full code at present, consented to intubated, would want CRRT if necessary; updated via phone 8/28]  Critical care time:   The patient is critically ill with multiple organ systems failure and requires high complexity decision making for assessment and support, frequent evaluation and titration of therapies, application of advanced monitoring technologies and extensive interpretation of multiple databases. Critical Care Time devoted to patient care services described in this note independent of APP/resident time (if applicable)  is 33 minutes.   Jennet Epley MD [place] [facility] Personal pager: See Amion If unanswered, please page CCM On-call: #[phone]

## 2023-09-02 NOTE — Progress Notes (Addendum)
 Left A line dressing change, good waveform, intact, secure, 2 respiratory therapist present.

## 2023-09-02 NOTE — Plan of Care (Signed)
  Daily Progress Note   Patient Name: Sherilee Smotherman       Date: 09/02/2023 DOB: 1950/09/01  Age: 73 y.o. MRN#: [id number] Attending Physician: Neda Jennet LABOR, MD Primary Care Physician: Antonio Meth, [name] SAUNDERS, DO Admit Date: 08/26/2023 Length of Stay: 7 days  Discussed care with PCCM provider today.  PCCM provider has been discussing care with family daily.  Family has continued to express wishes to continue aggressive medical interventions at this time.  Antibiotics are being escalated.  Patient failed weaning today in setting of desaturations and hypotension.  Palliative medicine team continuing to follow along with patient's medical journey while allowing time for outcomes in setting of aggressive medical interventions.   Tinnie Radar, DO Palliative Care Provider PMT # [phone]  No Charge Note

## 2023-09-02 NOTE — Progress Notes (Signed)
   09/02/23 0913  Vent Select  Invasive or Noninvasive Invasive  Adult Vent Y  Airway 7.5 mm  Placement Date/Time: 08/29/23 0955   Placed By: ICU physician  Airway Device: Endotracheal Tube  ETT Types: Oral  Size (mm): 7.5 mm  Cuffed: Cuffed  Airway Equipment: Stylet  Placement Confirmation: Chest Rise;ETCO2 Detector;Bilateral Breath Sounds;Di...  Secured at (cm) 22 cm  Measured From Teeth  Secured Location Right  Secured By English as a second language teacher No  Tube Holder Repositioned Yes  Prone position No  Head position Right  Cuff Pressure (cm H2O) Green OR 18-26 CmH2O  Site Condition Drainage (Comment)  Adult Ventilator Settings  Vent Type (S)  Servo i  Humidity (S)  HME  Vent Mode (S)  CPAP;PSV  FiO2 (%) (S)  45 %  Pressure Support (S)  17 cmH20 (12/5, total: 17/5. Pt's Sp02 dropped to 84%, B/P Aline dropped in the 80s.)  PEEP (S)  5 cmH20  Adult Ventilator Measurements  Peak Airway Pressure 17 L/min  Mean Airway Pressure 8 cmH20  Resp Rate Spontaneous 22 br/min  Resp Rate Total 22 br/min  Spont TV 323 mL  Measured Ve 6.9 L  Total PEEP 5 cmH20  SpO2 94 %  Adult Ventilator Alarms  Alarms On Y  Ve High Alarm 18 L/min  Ve Low Alarm 3.5 L/min  Resp Rate High Alarm 38 br/min  Resp Rate Low Alarm 10  PEEP Low Alarm 5 cmH2O  Press High Alarm 45 cmH2O  T Apnea 20 sec(s)  VAP Prevention  HOB> 30 Degrees Y  Daily Weaning Assessment  Daily Assessment of Readiness to Wean Wean protocol criteria met (SBT performed)  Reason not met (S)  Fi02 > 40%;PEEP > 8 (Failed due to hypoventilation, hypotension.)  SBT Method  (PSV 17/5.)  Weaning Start Time 0913  Reason SBT Terminated (S)  SpO2 < 92% with O2 adjustment per protocol  Breath Sounds  Bilateral Breath Sounds Diminished  Vent Respiratory Assessment  Level of Consciousness Responds to Pain  Suction Method  Respiratory Interventions Oral suction;Airway suction  Oral Suctioning/Secretions  Suction Type Oral  Suction  Device Yankauer  Secretion Amount Small  Secretion Color Clear;White  Secretion Consistency Thin  Suction Tolerance Tolerated well  Suctioning Adverse Effects None  Airway Suctioning/Secretions  Suction Type ETT  Suction Device  Catheter  Secretion Amount Small  Secretion Color White  Secretion Consistency Thin  Suction Tolerance Tolerated fairly well  Suctioning Adverse Effects Tachypnea

## 2023-09-03 DIAGNOSIS — J441 Chronic obstructive pulmonary disease with (acute) exacerbation: Secondary | ICD-10-CM | POA: Diagnosis not present

## 2023-09-03 DIAGNOSIS — Z515 Encounter for palliative care: Secondary | ICD-10-CM | POA: Diagnosis not present

## 2023-09-03 DIAGNOSIS — J9621 Acute and chronic respiratory failure with hypoxia: Secondary | ICD-10-CM | POA: Diagnosis not present

## 2023-09-03 DIAGNOSIS — J9622 Acute and chronic respiratory failure with hypercapnia: Secondary | ICD-10-CM | POA: Diagnosis not present

## 2023-09-03 DIAGNOSIS — U071 COVID-19: Secondary | ICD-10-CM | POA: Diagnosis not present

## 2023-09-03 DIAGNOSIS — Z7189 Other specified counseling: Secondary | ICD-10-CM

## 2023-09-03 DIAGNOSIS — R4589 Other symptoms and signs involving emotional state: Secondary | ICD-10-CM

## 2023-09-03 LAB — GLUCOSE, CAPILLARY
Glucose-Capillary: 196 mg/dL — ABNORMAL HIGH (ref 70–99)
Glucose-Capillary: 183 mg/dL — ABNORMAL HIGH (ref 70–99)
Glucose-Capillary: 195 mg/dL — ABNORMAL HIGH (ref 70–99)
Glucose-Capillary: 234 mg/dL — ABNORMAL HIGH (ref 70–99)
Glucose-Capillary: 203 mg/dL — ABNORMAL HIGH (ref 70–99)
Glucose-Capillary: 240 mg/dL — ABNORMAL HIGH (ref 70–99)

## 2023-09-03 LAB — BASIC METABOLIC PANEL WITH GFR
Anion gap: 15 (ref 5–15)
BUN: 103 mg/dL — ABNORMAL HIGH (ref 8–23)
CO2: 28 mmol/L (ref 22–32)
Calcium: 7.6 mg/dL — ABNORMAL LOW (ref 8.9–10.3)
Chloride: 102 mmol/L (ref 98–111)
Creatinine, Ser: 1.81 mg/dL — ABNORMAL HIGH (ref 0.44–1.00)
GFR, Estimated: 29 mL/min — ABNORMAL LOW (ref >60)
Glucose, Bld: 204 mg/dL — ABNORMAL HIGH (ref 70–99)
Potassium: 4.4 mmol/L (ref 3.5–5.1)
Sodium: 146 mmol/L — ABNORMAL HIGH (ref 135–145)

## 2023-09-03 LAB — CBC WITH DIFFERENTIAL/PLATELET
Abs Immature Granulocytes: 0.71 K/uL — ABNORMAL HIGH (ref 0.00–0.07)
Basophils Absolute: 0 K/uL (ref 0.0–0.1)
Basophils Relative: 0 %
Eosinophils Absolute: 0 K/uL (ref 0.0–0.5)
Eosinophils Relative: 0 %
HCT: 29 % — ABNORMAL LOW (ref 36.0–46.0)
Hemoglobin: 7.9 g/dL — ABNORMAL LOW (ref 12.0–15.0)
Immature Granulocytes: 5 %
Lymphocytes Relative: 5 %
Lymphs Abs: 0.7 K/uL (ref 0.7–4.0)
MCH: 25.2 pg — ABNORMAL LOW (ref 26.0–34.0)
MCHC: 27.2 g/dL — ABNORMAL LOW (ref 30.0–36.0)
MCV: 92.4 fL (ref 80.0–100.0)
Monocytes Absolute: 0.9 K/uL (ref 0.1–1.0)
Monocytes Relative: 6 %
Neutro Abs: 12.7 K/uL — ABNORMAL HIGH (ref 1.7–7.7)
Neutrophils Relative %: 84 %
Platelets: 118 K/uL — ABNORMAL LOW (ref 150–400)
RBC: 3.14 MIL/uL — ABNORMAL LOW (ref 3.87–5.11)
RDW: 18.2 % — ABNORMAL HIGH (ref 11.5–15.5)
WBC: 15.1 K/uL — ABNORMAL HIGH (ref 4.0–10.5)
nRBC: 0.3 % — ABNORMAL HIGH (ref 0.0–0.2)

## 2023-09-03 LAB — MAGNESIUM: Magnesium: 3 mg/dL — ABNORMAL HIGH (ref 1.7–2.4)

## 2023-09-03 LAB — PHOSPHORUS: Phosphorus: 5.8 mg/dL — ABNORMAL HIGH (ref 2.5–4.6)

## 2023-09-03 MED ORDER — SODIUM CHLORIDE 0.9 % IV SOLN: INTRAVENOUS | Status: AC | PRN

## 2023-09-03 NOTE — Progress Notes (Signed)
 PT Cancellation Note  Patient Details Name: Morgan Fischer MRN: [id number] DOB: 1950/05/19   Cancelled Treatment:    Reason Eval/Treat Not Completed: Medical issues which prohibited therapy  Remains on vent.  PT to continue to follow.  Darice Potters PT Acute Rehabilitation Services Office [phone]   Potters Darice Norris 09/03/2023, 7:01 AM

## 2023-09-03 NOTE — Plan of Care (Signed)
 Patient not able to wean at this time, per day shift nurse, patient failed weaning/waking trial. Cooling blanket remains on, temperature is coming down(see chart) all lines and tubes patient and flushed per protocol, see daily labs for results, bed in lowest locked position, call bell in reach, side rails up with bed alarm on, air positioning pad reposition under patient, scd's on as per orders, spoke with daughter on the phone to give her an update on her mother, all questions answer at that time, daughter instructed to call to speak with her mothers providers for a more in depth assessment on patients status.  Problem: Education: Goal: Knowledge of risk factors and measures for prevention of condition will improve Outcome: Progressing   Problem: Coping: Goal: Psychosocial and spiritual needs will be supported Outcome: Progressing   Problem: Respiratory: Goal: Will maintain a patent airway Outcome: Progressing Goal: Complications related to the disease process, condition or treatment will be avoided or minimized Outcome: Progressing   Problem: Education: Goal: Knowledge of General Education information will improve Description: Including pain rating scale, medication(s)/side effects and non-pharmacologic comfort measures Outcome: Progressing   Problem: Health Behavior/Discharge Planning: Goal: Ability to manage health-related needs will improve Outcome: Progressing   Problem: Clinical Measurements: Goal: Ability to maintain clinical measurements within normal limits will improve Outcome: Progressing Goal: Will remain free from infection Outcome: Progressing Goal: Cardiovascular complication will be avoided Outcome: Progressing   Problem: Activity: Goal: Risk for activity intolerance will decrease Outcome: Progressing   Problem: Nutrition: Goal: Adequate nutrition will be maintained Outcome: Progressing   Problem: Coping: Goal: Level of anxiety will decrease Outcome:  Progressing   Problem: Elimination: Goal: Will not experience complications related to bowel motility Outcome: Progressing Goal: Will not experience complications related to urinary retention Outcome: Progressing   Problem: Pain Managment: Goal: General experience of comfort will improve and/or be controlled Outcome: Progressing   Problem: Safety: Goal: Ability to remain free from injury will improve Outcome: Progressing   Problem: Skin Integrity: Goal: Risk for impaired skin integrity will decrease Outcome: Progressing   Problem: Education: Goal: Ability to describe self-care measures that may prevent or decrease complications (Diabetes Survival Skills Education) will improve Outcome: Progressing Goal: Individualized Educational Video(s) Outcome: Progressing   Problem: Coping: Goal: Ability to adjust to condition or change in health will improve Outcome: Progressing   Problem: Fluid Volume: Goal: Ability to maintain a balanced intake and output will improve Outcome: Progressing   Problem: Health Behavior/Discharge Planning: Goal: Ability to identify and utilize available resources and services will improve Outcome: Progressing Goal: Ability to manage health-related needs will improve Outcome: Progressing   Problem: Metabolic: Goal: Ability to maintain appropriate glucose levels will improve Outcome: Progressing   Problem: Nutritional: Goal: Maintenance of adequate nutrition will improve Outcome: Progressing Goal: Progress toward achieving an optimal weight will improve Outcome: Progressing   Problem: Skin Integrity: Goal: Risk for impaired skin integrity will decrease Outcome: Progressing   Problem: Tissue Perfusion: Goal: Adequacy of tissue perfusion will improve Outcome: Progressing   Problem: Activity: Goal: Ability to tolerate increased activity will improve Outcome: Progressing   Problem: Respiratory: Goal: Ability to maintain a clear airway and  adequate ventilation will improve Outcome: Progressing   Problem: Role Relationship: Goal: Method of communication will improve Outcome: Progressing

## 2023-09-03 NOTE — Plan of Care (Signed)
  Problem: Coping: Goal: Psychosocial and spiritual needs will be supported Outcome: Progressing   Problem: Education: Goal: Knowledge of General Education information will improve Description: Including pain rating scale, medication(s)/side effects and non-pharmacologic comfort measures Outcome: Progressing   Problem: Clinical Measurements: Goal: Respiratory complications will improve Outcome: Progressing   Problem: Clinical Measurements: Goal: Will remain free from infection Outcome: Progressing

## 2023-09-03 NOTE — Progress Notes (Signed)
 NAME:  Morgan Fischer, MRN:  [id number], DOB:  14-Jul-1950, LOS: 8 ADMISSION DATE:  08/26/2023, CONSULTATION DATE: 08/26/2023 REFERRING MD: Redia - TR[facility], CHIEF COMPLAINT: Acute on chronic hypoxemic/hypercarbic respiratory failure  History of Present Illness:  Patient with known history of chronic respiratory failure on BiPAP at night for hypercapnic respiratory failure, history of chronic diastolic congestive heart failure Transferred from med [facility] where she had presented with worsening shortness of breath of about 5 days duration She has had a cough, no sputum production, no chest pain or chest discomfort No fevers or chills No contact with anyone with a febrile illness No change in bowel habits, no dysuria  Recently followed up at [facility] last Wednesday and prior to that visit was feeling fine but she felt that there may have been people who was sick in the waiting area  Normally on 2 L of oxygen  during the day but in the last few weeks oxygen  levels were increased to 3 L because of desaturations  History of hypertrophic cardiomyopathy, chronic kidney disease stage IIIa, type 2 diabetes asthma/COPD, morbid obesity, usual activity tolerance about 20-30 steps, never smoker  Has been compliant with her usual medications  Tested positive for COVID but does not recollect being around anybody with acute febrile illness  Pertinent Medical History:   Past Medical History:  Diagnosis Date   Acute diastolic heart failure (HCC) 07/17/2019   Acute hypercapnic respiratory failure (HCC) 07/21/2016   Acute on chronic congestive heart failure (HCC) 07/19/2016   Acute on chronic diastolic CHF (congestive heart failure) (HCC) 03/08/2020   Acute on chronic respiratory failure with hypoxia (HCC) 10/20/2018   Acute on chronic respiratory failure with hypoxia and hypercapnia (HCC) 03/08/2020   Acute respiratory acidosis (HCC) 07/19/2016   Acute respiratory distress 07/19/2016   Acute respiratory  failure (HCC) 03/08/2020   Anxiety 03/15/2020   Aortic stenosis 11/01/2018   Arthritis    Asthma    Atherosclerosis of native coronary artery of native heart without angina pectoris 03/17/2016   Cardiomyopathy (HCC) 03/15/2020   Chest pain 07/14/2019   CHF (congestive heart failure) (HCC)    Chronic diastolic heart failure (HCC) 03/17/2016   Chronic hypoxemic respiratory failure (HCC)    Chronic kidney disease, stage 3b (HCC) 02/04/2021   CKD (chronic kidney disease)    COPD (chronic obstructive pulmonary disease) (HCC)    COPD with acute exacerbation (HCC) 03/08/2020   Diabetes mellitus without complication (HCC)    Drug allergy 07/21/2016   Essential (primary) hypertension 11/19/2016   Essential hypertension 11/19/2016   Gout 11/19/2016   Heart block AV complete (HCC)    High cholesterol    History of gout 04/05/2023   HOCM (hypertrophic obstructive cardiomyopathy) (HCC) 09/18/2017   Hyperlipidemia associated with type 2 diabetes mellitus (HCC) 11/23/2022   Hyperlipidemia, unspecified 03/15/2020   Hypertension    Hypertensive heart disease with heart failure (HCC) 03/17/2016   Hypertensive heart disease without congestive heart failure 03/15/2020   Hypertrophic cardiomyopathy (HCC) 04/12/2020   Hypokalemia 08/24/2015   Hypomagnesemia    Hypoxia 03/17/2016   Insomnia 11/23/2022   Iron deficiency 03/15/2020   Leukocytosis 07/19/2016   Long term (current) use of insulin  (HCC) 02/04/2021   Mild intermittent asthma 02/04/2021   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   Mixed hyperlipidemia 03/15/2020   Morbid obesity (HCC)    Morbid obesity with BMI of 60.0-69.9, adult (HCC) 07/21/2016   Need for influenza vaccination 11/23/2022   Need for pneumococcal 20-valent conjugate vaccination 11/23/2022  Nonrheumatic aortic valve stenosis 03/17/2016   Obesity hypoventilation syndrome (HCC) 03/08/2020   Obstructive sleep apnea 11/19/2016   Osteoarthritis of knee 03/15/2020   Panniculitis  08/23/2015   Peripheral venous insufficiency 03/15/2020   Pure hypercholesterolemia 02/13/2017   SOB (shortness of breath) 08/24/2015   Troponin level elevated 08/24/2015   Type 2 diabetes mellitus with hyperglycemia (HCC) 07/19/2016   Type 2 diabetes mellitus with stage 3 chronic kidney disease, with long-term current use of insulin  (HCC) 11/19/2016   Type 2 diabetes mellitus without complication, without long-term current use of insulin  (HCC) 04/12/2020   Type 2 diabetes mellitus without complications (HCC) 03/15/2020   Type 2 diabetes mellitus, without long-term current use of insulin  (HCC) 11/19/2016   Significant Hospital Events: Including procedures, antibiotic start and stop dates in addition to other pertinent events   8/24 CXR with pulmonary vascular congestion 8/25 No acute issues overnight, wore BIPAP. pCO2 remains elevated to 100. Wakes up appropriately but dozes back off. 8/27 intubated  8/28 Overnight, anisocoria noted prompted CT Head . Negative  8/29 some issues with hypertension overnight  8/30-fever 102, cultures drawn-escalated antibiotics   Antibiotics: Ceftriaxone  8/24-8/25 Doxycycline  8/24-8/29 Zosyn  8/26-8/30 Meropenem  8/30>> Linezolid  8/27>>  BAL 8/27-no organisms Blood culture 8/30-no organisms  Interim History / Subjective:  Was able to wean off sedation this morning, patient is easily arousable, does attempt to follow commands with difficulty Failed weaning this morning, desaturating after about 10 minutes  Objective:    Blood pressure (!) 100/44, pulse 87, temperature 100.2 F (37.9 C), resp. rate (!) 24, height 5' 3 (1.6 m), weight (!) 144.4 kg, SpO2 95%.    Vent Mode: PRVC FiO2 (%):  [45 %] 45 % Set Rate:  [24 bmp] 24 bmp Vt Set:  [410 mL] 410 mL PEEP:  [6 cmH20] 6 cmH20 Plateau Pressure:  [22 cmH20-25 cmH20] 25 cmH20   Intake/Output Summary (Last 24 hours) at 09/03/2023 0948 Last data filed at 09/03/2023 0615 Gross per 24 hour  Intake  2448.55 ml  Output 1725 ml  Net 723.55 ml   Filed Weights   09/01/23 0500 09/02/23 0533 09/03/23 0442  Weight: 134.5 kg (!) 141.9 kg (!) 144.4 kg   Physical Examination: General: Acute on chronically ill-appearing HEENT: Endotracheal tube in place, moist oral mucosa Neuro: Eyes are open, responds to voice, attempts to squeeze my hands on command-very weak CV: S1-S2 appreciated PULM: Decreased air movement bilaterally GI: Soft, bowel sounds appreciated Extremities: Warm and dry Skin: no rashes or lesions  I reviewed last 24 h vitals and pain scores, last 48 h intake and output, last 24 h labs and trends, and last 24 h imaging results. Sodium 146, BUN 103, creatinine 1.81  Resolved Problem List:  Shock   Assessment and Plan:   Acute on chronic hypoxemic/hypercapnic respiratory failure COVID-positive COPD with exacerbation - Intubated for hypercapnic respiratory failure on 8/27 - Continue mechanical ventilation -Target TVol 6-8cc/kgIBW -Target Plateau Pressure < 30cm H20 -Target driving pressure less than 15 cm of water  -Target PaO2 55-65: titrate PEEP/FiO2 per protocol -Ventilator associated pneumonia prevention protocol -Continue weaning as tolerated, now weaning well at present  Hypertrophic cardiomyopathy Chronic diastolic heart failure - Echocardiogram 8/25 with ejection fraction of 70-75%, hyperdynamic function, severe concentric LVH, grade 2 diastolic dysfunction with mildly enlarged right ventricle - Continue statin, continue aspirin   Acute kidney injury on chronic kidney disease stage IIIa -Continue to trend electrolytes - Avoid nephrotoxic medications  Type 2 diabetes - Continue resistant scale SSI - Goal  CBG 140-180  Risk of decompensation remains very high Limited pulmonary reserve at baseline  Not weaning successfully Remains on antibiotics Fever trend is down-Tmax 100.8  Updated patient's daughter at bedside - Context of chronic lung and heart  condition, not able to wean at present - May require tracheostomy, feeding tube, long-term vent - Compromised kidney function is acute on chronic and may mean she is permanently on dialysis going forward  Best Practice (right click and Reselect all SmartList Selections daily)   Diet/type: Tube feeds DVT prophylaxis LMWH GI prophylaxis: PPI Lines: PICC line, A-line Foley:  Yes, and it is still needed Code Status:  full code Last date of multidisciplinary goals of care discussion [9/1-discussed with patient's daughter]  Critical care time:   The patient is critically ill with multiple organ systems failure and requires high complexity decision making for assessment and support, frequent evaluation and titration of therapies, application of advanced monitoring technologies and extensive interpretation of multiple databases. Critical Care Time devoted to patient care services described in this note independent of APP/resident time (if applicable)  is 33 minutes.   Jennet Epley MD [facility] Personal pager: See Amion If unanswered, please page CCM On-call: [phone]

## 2023-09-03 NOTE — Progress Notes (Signed)
 Daily Progress Note   Patient Name: Morgan Fischer       Date: 09/03/2023 DOB: [birth year]  Age: 73 y.o. MRN#: [id number] Attending Physician: Morgan Fischer LABOR, MD Primary Care Physician: Morgan Fischer, Morgan SAUNDERS, DO Admit Date: 08/26/2023 Length of Stay: 8 days  Reason for Consultation/Follow-up: Establishing goals of care  Subjective:   CC: Patient remains intubated and sedated.  Following up regarding complex medical decision making.  Subjective:  Reviewed EMR including recent documentation from [facility] provider.  Discussed care with bedside RN for medical updates as well.  Patient has continued to fail weaning with desaturation after about 10 minutes today.  Patient is remaining on broad-spectrum antibiotics due to fevers.  Blood cultures currently negative.  Patient also has AKI on CKD.  Review of recent BMP noting creatinine 1.81 with GFR estimated at 29.  Was informed by RN that patient's daughter, [name], was present in waiting area.  [name] has also been at home sick with COVID though has now recovered.  Presented to waiting room to meet with patient's daughter, [name].  Introduced myself as a member of the palliative medicine team and my role in patient's medical journey.  Discussed updates that she had heard from [facility] provider this morning about seriousness of patient's medical issues.  Discussed concerns related to patient's lung disease which is now worsened by COVID infection.  Patient also has multiple underlying medical comorbidities including HOCM.   Spent time learning about patient suffering from chronic illnesses for the past 20 years.  Patient normally lives with daughter who assist in her care.  Patient has 2 children, [name] and her brother who lives in [place].  Her brother is currently on the way to the hospital.  There are multiple family members, patient's siblings, who are involved with supporting family.  Inquired about patient's life outside of the hospital when she  was functional and what brought her joy.  Daughter noted that patient has had a difficult time since patient's husband, [name]'s dad, died in 10/06/2016.  At this point patient does enjoy time with family though is not very interactive with other hobbies.  Patient had great difficulties after losing her husband as they had been together for approximately 47 years between dating and marriage.  Empathized with difficult situation.  With permission, able to discuss pathways for medical care moving forward.  Discussed how patient is currently receiving aggressive medical management.  Despite this patient still is not able to wean from ventilator.  Patient had poor lung function at baseline which is now worsened by COVID-19 infection.  Spent time showing daughter imaging related to patient's lungs and the concerns providers have about her being able to improve at all. We discussed what aggressive medical interventions moving forward would look like.  Noted usually around 14 days there is consideration about possible tracheostomy placement if the patient is not weaning from a ventilator.  Noted that this provider has not talked with a surgeon regarding this so first patient would have to be evaluated to determine if a tracheostomy can be safely performed if wanted to pursue that route of care in that option.  Explained this would mean patient would be on ventilator support and would need care likely in a facility moving forward. Also discussed alternative pathway would be to allow patient to be comfortable knowing she is at the end of her life if she cannot support herself from a respiratory standpoint.  With permission able to discuss CODE STATUS.  Explained full code  versus DNR/DNI.  Expressed concern that if patient were sick enough her heart were to stop despite the aggressive medical intervention she is currently receiving, that would be end-of-life and providing interventions such as cardiac resuscitation would not  provide good quality of life and not fix patient's underlying lung disease.  Recommended further discussions between her and her brother about change of CODE STATUS to DNR. Spent time answering questions as able.  Noted palliative medicine team will continue to follow along with patient's medical journey.  Asked by RN in afternoon to represent to talk with family further as more family had come to visit.  Able to meet with family including patient's daughter, patient's son, patient's brother, and patient's sister.  Introduced myself as a member of the palliative medicine team my role in patient's medical care.  [name] had already updated all of them about discussion we had.  Spent time answering questions about CODE STATUS.  I again spent time explaining full code versus DNR/DNI.  Again expressed concerns with medical interventions that would not change patient's underlying disease process. Also discussed pathways of care moving forward.  Again explained the idea of aggressive medical pathway with possible evaluation for tracheostomy if patient unable to wean from vent.  Also able to discuss alternative pathway of proceeding with comfort in performing palliative extubation knowing patient is at the end of her life.  All questions answered at that time.  Family to continue discussing further.  Noted palliative medicine team will continue to follow with patient's medical journey.  Discussed care with PCCM provider and RN to coordinate care.  Objective:   Vital Signs:  BP (!) 100/44   Pulse 87   Temp 100.2 F (37.9 C) (Oral)   Resp (!) 24   Ht 5' 3 (1.6 m)   Wt (!) 144.4 kg   LMP  (LMP Unknown)   SpO2 95%   BMI 56.39 kg/m   Physical Exam: General: Sedated on ventilator support Cardiovascular: Bradycardia noted Respiratory: Intubated on ventilator support  Neuro: Sedated  Assessment & Plan:   Assessment: Patient is a 73 year old female with a past medical history of chronic hypoxic and  hypercapnic respiratory failure on 2 L nasal cannula and daytime and nighttime BiPAP, HOCM, HFpEF, COPD, diabetes mellitus type 2, hyperlipidemia, and CKD who was admitted on 08/26/2023 for management of shortness of breath and cough.  During hospitalization, patient required BiPAP support for acute on chronic hypoxic and hypercapnic respiratory failure in setting of COVID-19 infection diagnosis and heart failure and COPD exacerbation.  Patient was transferred to stepdown unit on 08/28/2023 due to worsening respiratory status.  Palliative medicine team consulted to assist with complex medical decision making.   Recommendations/Plan: # Complex medical decision making/goals of care:   - Unable to discuss care planning with patient due to medical condition.    - Extensive discussion with patient's daughter and then with patient's daughter, son, brother, and sister as detailed above in HPI.  Discussed medical pathways for care moving forward.  Discussed aggressive medical interventions and what this would entail such as possible evaluation for tracheostomy if not able to wean from vent.  Also discussed alternative pathway of focusing on patient's comfort and performing palliative extubation knowing patient would be at end-of-life.  Family to discuss further and will inform team of any decisions that are made.  Patient's primary decision makers would be her son and daughter.  Palliative medicine team continuing to follow along to engage in conversations as able and  appropriate moving forward.                -  Code Status: Full Code      - Extensive discussion regarding CODE STATUS and full code versus DNR/DNI as detailed above in HPI.  Have expressed concern that if patient's heart were to stop despite the aggressive medical intervention she has been receiving, interventions such as cardiac resuscitation would not lead to a good quality of life or change patient's underlying severe lung disease.  Recommended  consideration of changing CODE STATUS to DNR/DNI.  Family to further discuss and consider this option.  Remains full code.  # Psycho-social/Spiritual Support:  - Support System: Daughter- Torah Pinnock, son, siblings   # Discharge Planning:  To Be Determined  Thank you for allowing the palliative care team to participate in the care [facility].  Tinnie Radar, DO Palliative Care Provider PMT # [phone]  If patient remains symptomatic despite maximum doses, please call PMT at [phone] between 0700 and 1900. Outside of these hours, please call attending, as PMT does not have night coverage.  Personally spent 65 minutes in patient care including extensive chart review (labs, imaging, progress/consult notes, vital signs), medically appropraite exam, discussed with treatment team, education to patient, family, and staff, documenting clinical information, medication review and management, coordination of care, and available advanced directive documents.

## 2023-09-03 DEATH — deceased

## 2023-09-04 ENCOUNTER — Institutional Professional Consult (permissible substitution)

## 2023-09-04 DIAGNOSIS — I5032 Chronic diastolic (congestive) heart failure: Secondary | ICD-10-CM

## 2023-09-04 DIAGNOSIS — I422 Other hypertrophic cardiomyopathy: Secondary | ICD-10-CM

## 2023-09-04 LAB — GLUCOSE, CAPILLARY
Glucose-Capillary: 131 mg/dL — ABNORMAL HIGH (ref 70–99)
Glucose-Capillary: 145 mg/dL — ABNORMAL HIGH (ref 70–99)
Glucose-Capillary: 157 mg/dL — ABNORMAL HIGH (ref 70–99)
Glucose-Capillary: 186 mg/dL — ABNORMAL HIGH (ref 70–99)
Glucose-Capillary: 223 mg/dL — ABNORMAL HIGH (ref 70–99)
Glucose-Capillary: 251 mg/dL — ABNORMAL HIGH (ref 70–99)
Glucose-Capillary: 260 mg/dL — ABNORMAL HIGH (ref 70–99)

## 2023-09-04 LAB — CBC WITH DIFFERENTIAL/PLATELET
Abs Immature Granulocytes: 0.79 10*3/uL — ABNORMAL HIGH (ref 0.00–0.07)
Basophils Absolute: 0 10*3/uL (ref 0.0–0.1)
Basophils Relative: 0 %
Eosinophils Absolute: 0 10*3/uL (ref 0.0–0.5)
Eosinophils Relative: 0 %
HCT: 28.9 % — ABNORMAL LOW (ref 36.0–46.0)
Hemoglobin: 8.2 g/dL — ABNORMAL LOW (ref 12.0–15.0)
Immature Granulocytes: 6 %
Lymphocytes Relative: 5 %
Lymphs Abs: 0.6 10*3/uL — ABNORMAL LOW (ref 0.7–4.0)
MCH: 25.6 pg — ABNORMAL LOW (ref 26.0–34.0)
MCHC: 28.4 g/dL — ABNORMAL LOW (ref 30.0–36.0)
MCV: 90.3 fL (ref 80.0–100.0)
Monocytes Absolute: 0.8 10*3/uL (ref 0.1–1.0)
Monocytes Relative: 6 %
Neutro Abs: 11.4 10*3/uL — ABNORMAL HIGH (ref 1.7–7.7)
Neutrophils Relative %: 83 %
Platelets: 109 10*3/uL — ABNORMAL LOW (ref 150–400)
RBC: 3.2 MIL/uL — ABNORMAL LOW (ref 3.87–5.11)
RDW: 18.2 % — ABNORMAL HIGH (ref 11.5–15.5)
Smear Review: NORMAL
WBC: 13.6 10*3/uL — ABNORMAL HIGH (ref 4.0–10.5)
nRBC: 0.4 % — ABNORMAL HIGH (ref 0.0–0.2)

## 2023-09-04 LAB — URINE CULTURE: Culture: NO GROWTH

## 2023-09-04 MED ORDER — INSULIN GLARGINE 100 UNIT/ML ~~LOC~~ SOLN
18.0000 [IU] | Freq: Every day | SUBCUTANEOUS | Status: DC
  Administered 2023-09-04 – 2023-09-05 (×2): 18 [IU] via SUBCUTANEOUS
  Filled 2023-09-04 (×4): qty 0.18

## 2023-09-04 MED ORDER — SODIUM CHLORIDE 0.9 % IV SOLN
100.0000 mg | INTRAVENOUS | Status: DC
  Filled 2023-09-04 (×2): qty 20

## 2023-09-04 MED ORDER — SODIUM CHLORIDE 0.9 % IV SOLN: 100.0000 mg | Freq: Every day | INTRAVENOUS | Status: DC

## 2023-09-04 MED ORDER — SODIUM CHLORIDE 0.9 % IV SOLN: 200.0000 mg | Freq: Once | INTRAVENOUS | Status: DC

## 2023-09-04 NOTE — Progress Notes (Signed)
 Nutrition Follow-up  DOCUMENTATION CODES:   Morbid obesity  INTERVENTION:  - Continue current TF regimen: Vital 1.2 at 55 ml/h (1320 ml per day) Prosource TF20 60 ml daily Provides 1664 kcal, 119 gm protein, 1070 ml free water  daily   - FWF per CCM.   NUTRITION DIAGNOSIS:   Inadequate oral intake related to inability to eat as evidenced by NPO status. *ongoing  GOAL:   Patient will meet greater than or equal to 90% of their needs *met with TF  MONITOR:   Vent status, Labs, TF tolerance  REASON FOR ASSESSMENT:   Consult Enteral/tube feeding initiation and management  ASSESSMENT:   73 y.o. female with PMH chronic hypoxemic and hypercapnic respiratory failure on daytime oxygen  2 L and nighttime BiPAP, chronic diastolic congestive heart failure, COPD who is admitted with about 5 days of shortness of breath, cough. Found to be Covid+.  8./24 Admit 8/27 Intubated 8/29 TF initiated   Patient remains intubated on ventilator support MV: 9.5 L/min Temp (24hrs), Avg:99.3 F (37.4 C), Min:97.3 F (36.3 C), Max:101.5 F (38.6 C)  Patient continues to tolerate TF regimen. Will continue to utilize Tahjanae Blankenburg guidelines for morbidly obese given patient's significant respiratory illness history (chronic resp failure and COPD) and difficulty weaning patient from the ventilator.   CCM discussed trach with family today and family unsure at this time and are planning on continuing to discuss. Palliative care continues to follow. Will monitor for GOC decisions.     Admit weight: 280# Current weight: 281# I&O's: +4.4L since admit   Medications reviewed and include: Colace, Miralax , Senokot, MVI Fentanyl    Labs reviewed:  Labs pending at this tinme HA1C 7.3 (as of 04/03/23) Blood Glucose 183-260 x24 hours  Diet Order:   Diet Order             Diet NPO time specified  Diet effective now                   EDUCATION NEEDS:  Not appropriate for education at this  time  Skin:  Skin Assessment: Reviewed RN Assessment  Last BM:  9/2 - rectal tube  Height:  Ht Readings from Last 1 Encounters:  08/29/23 5' 3 (1.6 m)   Weight:  Wt Readings from Last 1 Encounters:  09/04/23 127.6 kg   Ideal Body Weight:  52.27 kg  BMI:  Body mass index is 49.83 kg/m.  Estimated Nutritional Needs:  Kcal:  1400-1750 kcals Protein:  105-130 grams Fluid:  >/= 1.5L    Trude Ned RD, LDN Contact via Secure Chat.

## 2023-09-04 NOTE — Plan of Care (Signed)
  Problem: Coping: Goal: Psychosocial and spiritual needs will be supported Outcome: Progressing   Problem: Respiratory: Goal: Will maintain a patent airway Outcome: Progressing Goal: Complications related to the disease process, condition or treatment will be avoided or minimized Outcome: Progressing   

## 2023-09-04 NOTE — Progress Notes (Signed)
 OT Cancellation Note  Patient Details Name: Morgan Fischer MRN: [id number] DOB: 26-Aug-1950   Cancelled Treatment:    Reason Eval/Treat Not Completed: Medical issues which prohibited therapy/Pt remains on vent.   [name], OTR/L 09/04/2023, 9:42 AM

## 2023-09-04 NOTE — IPAL (Signed)
  Interdisciplinary Goals of Care Family Meeting   Date carried out: 09/04/2023  Location of the meeting: Conference room  Member's involved: NP, Daughter, and family   Durable Power of Attorney or Environmental health practitioner:  Discussion: We discussed goals of care for [employer] .  Discussed with daughter and another family member supporting the daughter in regards to patient's ongoing critical care status and GOC. Unfortunately, patient is not progressing from liberating from the mechanical ventilator. Patient has failed weaning over the last couple days- only being able to wean from 10-30 mins before having respiratory distress on the vent. Also, patient having to utilize higher pressure support while weaning. If patient continues to fail weaning, patient more than likely will require tracheostomy. This was discussed with daughter at length and daughter/family not sure if patient would ultimately want tracheostomy especially if would be life long. Palliative care is also following and has recommended DNR/DNI which is appropriate. Discussed this also with daughter. Daughter believes that patient would want to be Full Code at this time because of a prior hospitalization in April, she wanted full support. However, daughter expressed that she does not want her mother to suffer or does not know if her mother would ultimately would want to continue like this. Recommended to daughter and family to discuss with one another about if the patient would ultimately want a tracheostomy or not. If not, then would recommend to focus on quality of life versus quantity and focus on comfort.   Code status:   Code Status: Full Code   Disposition: Continue current acute care  Continue GOC discussion  Appreciate Palliative Team assistance   Time spent for the meeting: 25 mins   Sherlean Sharps AGACNP-BC   [facility] 09/04/2023, 12:16 PM  Please see [email or web address] for pager details.  From  7A-7P if no response, please call [phone]. After hours, please call ELink [phone].   09/04/2023, 12:01 PM

## 2023-09-04 NOTE — Progress Notes (Signed)
 NAME:  Morgan Fischer, MRN:  [id number], DOB:  1950/04/30, LOS: 9 ADMISSION DATE:  08/26/2023, CONSULTATION DATE: 08/26/2023 REFERRING MD: Redia - TRH, CHIEF COMPLAINT: Acute on chronic hypoxemic/hypercarbic respiratory failure  History of Present Illness:  Patient with known history of chronic respiratory failure on BiPAP at night for hypercapnic respiratory failure, history of chronic diastolic congestive heart failure Transferred from med [facility] where she had presented with worsening shortness of breath of about 5 days duration She has had a cough, no sputum production, no chest pain or chest discomfort No fevers or chills No contact with anyone with a febrile illness No change in bowel habits, no dysuria  Recently followed up at [facility] last Wednesday and prior to that visit was feeling fine but she felt that there may have been people who was sick in the waiting area  Normally on 2 L of oxygen  during the day but in the last few weeks oxygen  levels were increased to 3 L because of desaturations  History of hypertrophic cardiomyopathy, chronic kidney disease stage IIIa, type 2 diabetes asthma/COPD, morbid obesity, usual activity tolerance about 20-30 steps, never smoker  Has been compliant with her usual medications  Tested positive for COVID but does not recollect being around anybody with acute febrile illness  Pertinent Medical History:   Past Medical History:  Diagnosis Date   Acute diastolic heart failure (HCC) 07/17/2019   Acute hypercapnic respiratory failure (HCC) 07/21/2016   Acute on chronic congestive heart failure (HCC) 07/19/2016   Acute on chronic diastolic CHF (congestive heart failure) (HCC) 03/08/2020   Acute on chronic respiratory failure with hypoxia (HCC) 10/20/2018   Acute on chronic respiratory failure with hypoxia and hypercapnia (HCC) 03/08/2020   Acute respiratory acidosis (HCC) 07/19/2016   Acute respiratory distress 07/19/2016   Acute respiratory  failure (HCC) 03/08/2020   Anxiety 03/15/2020   Aortic stenosis 11/01/2018   Arthritis    Asthma    Atherosclerosis of native coronary artery of native heart without angina pectoris 03/17/2016   Cardiomyopathy (HCC) 03/15/2020   Chest pain 07/14/2019   CHF (congestive heart failure) (HCC)    Chronic diastolic heart failure (HCC) 03/17/2016   Chronic hypoxemic respiratory failure (HCC)    Chronic kidney disease, stage 3b (HCC) 02/04/2021   CKD (chronic kidney disease)    COPD (chronic obstructive pulmonary disease) (HCC)    COPD with acute exacerbation (HCC) 03/08/2020   Diabetes mellitus without complication (HCC)    Drug allergy 07/21/2016   Essential (primary) hypertension 11/19/2016   Essential hypertension 11/19/2016   Gout 11/19/2016   Heart block AV complete (HCC)    High cholesterol    History of gout 04/05/2023   HOCM (hypertrophic obstructive cardiomyopathy) (HCC) 09/18/2017   Hyperlipidemia associated with type 2 diabetes mellitus (HCC) 11/23/2022   Hyperlipidemia, unspecified 03/15/2020   Hypertension    Hypertensive heart disease with heart failure (HCC) 03/17/2016   Hypertensive heart disease without congestive heart failure 03/15/2020   Hypertrophic cardiomyopathy (HCC) 04/12/2020   Hypokalemia 08/24/2015   Hypomagnesemia    Hypoxia 03/17/2016   Insomnia 11/23/2022   Iron deficiency 03/15/2020   Leukocytosis 07/19/2016   Long term (current) use of insulin  (HCC) 02/04/2021   Mild intermittent asthma 02/04/2021   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   Mixed hyperlipidemia 03/15/2020   Morbid obesity (HCC)    Morbid obesity with BMI of 60.0-69.9, adult (HCC) 07/21/2016   Need for influenza vaccination 11/23/2022   Need for pneumococcal 20-valent conjugate vaccination 11/23/2022  Nonrheumatic aortic valve stenosis 03/17/2016   Obesity hypoventilation syndrome (HCC) 03/08/2020   Obstructive sleep apnea 11/19/2016   Osteoarthritis of knee 03/15/2020   Panniculitis  08/23/2015   Peripheral venous insufficiency 03/15/2020   Pure hypercholesterolemia 02/13/2017   SOB (shortness of breath) 08/24/2015   Troponin level elevated 08/24/2015   Type 2 diabetes mellitus with hyperglycemia (HCC) 07/19/2016   Type 2 diabetes mellitus with stage 3 chronic kidney disease, with long-term current use of insulin  (HCC) 11/19/2016   Type 2 diabetes mellitus without complication, without long-term current use of insulin  (HCC) 04/12/2020   Type 2 diabetes mellitus without complications (HCC) 03/15/2020   Type 2 diabetes mellitus, without long-term current use of insulin  (HCC) 11/19/2016   Significant Hospital Events: Including procedures, antibiotic start and stop dates in addition to other pertinent events   8/24 CXR with pulmonary vascular congestion 8/25 No acute issues overnight, wore BIPAP. pCO2 remains elevated to 100. Wakes up appropriately but dozes back off. 8/27 intubated  8/28 Overnight, anisocoria noted prompted CT Head . Negative  8/29 some issues with hypertension overnight  8/30-fever 102, cultures drawn-escalated antibiotics 9/1 failed weaning 9/2 only weaned on vent for 30 mins, on low dose fentanyl  gtt    Antibiotics: Ceftriaxone  8/24-8/25 Doxycycline  8/24-8/29 Zosyn  8/26-8/30 Meropenem  8/30>> Linezolid  8/27>>  BAL 8/27-no growth seen  Blood culture 8/30-no growth to date   Tracheal aspirate Acid Fast Smear pending   Interim History / Subjective:  Weaned only 30 mins this morning Following commands, deconditioned   Objective:    Blood pressure (!) 146/54, pulse 71, temperature 97.7 F (36.5 C), resp. rate (!) 24, height 5' 3 (1.6 m), weight 127.6 kg, SpO2 98%.    Vent Mode: PRVC FiO2 (%):  [45 %-50 %] 50 % Set Rate:  [24 bmp] 24 bmp Vt Set:  [410 mL] 410 mL PEEP:  [5 cmH20-6 cmH20] 6 cmH20 Pressure Support:  [10 cmH20] 10 cmH20 Plateau Pressure:  [24 cmH20-25 cmH20] 25 cmH20   Intake/Output Summary (Last 24 hours) at 09/04/2023  0941 Last data filed at 09/04/2023 9087 Gross per 24 hour  Intake 1590.86 ml  Output 3000 ml  Net -1409.14 ml   Filed Weights   09/02/23 0533 09/03/23 0442 09/04/23 0500  Weight: (!) 141.9 kg (!) 144.4 kg 127.6 kg   Physical Examination: General: acute on chronic elderly obese female, lying in icu bed on vent in NAD HEENT: Normocephalic, PERRLA intact, ETT, OG, Pink MM CV: s1,s2, RRR, no MRG, No JVD  pulm: clear, diminished, no distress on vent  Abs: bs active, soft  Extremities: generalized edema, no deformity, moves all extremities on command  Skin: no rash  Neuro: Rass 0 to -1, follows commands, cough gag reflex present  GU: foley intact GI: flexi seal    Resolved Problem List:  Shock   Assessment and Plan:   Acute on chronic hypoxemic/hypercapnic respiratory failure COVID-positive COPD with exacerbation - Intubated for hypercapnic respiratory failure on 8/27 P:  Continue ventilator support and lung protective strategies  Continue LTVV  Wean PEEP and Fio2 requirements to sat goal of >92%  HOB > 30 degrees Plat < 30  Aim for Driving pressures < 15  Intermittent Chest X-ray and ABGS Obtain and follow cultures-blood and tracheal aspirate VAP and PAD protocols in place  Wean sedation as tolerated, SBT and WUA daily    Hypertrophic cardiomyopathy Chronic diastolic heart failure - Echocardiogram 8/25 with ejection fraction of 70-75%, hyperdynamic function, severe concentric LVH, grade 2 diastolic  dysfunction with mildly enlarged right ventricle P: Continue ASA Continue Statin   Acute kidney injury on chronic kidney disease stage IIIa Acute Kidney Injury  Cr 2.23>1.91>1.60>1.55>1.92>1.87>1.81  P: Continue to trend renal function daily  Continue to monitor and optimize electrolytes daily Continue to monitor urine output Continue strict I/Os Continue Adequate renal perfusion  Avoid nephrotoxic agents   Type 2 diabetes P: Continue SSI resistant CBG  q4 Continue tube feedings   GOC:  Risk of decompensation remains very high Limited pulmonary reserve at baseline Not weaning successfully  patient's daughter updated on 9/1  Context of chronic lung and heart condition, not able to wean at present May require tracheostomy, feeding tube, long-term vent Compromised kidney function is acute on chronic and may mean she is permanently on dialysis going forward P: Will discuss GOC with daughter and family when they arrive to beside  - Concern patient will need more likely tracheostomy due to not able to wean  Palliative following, appreciate assistance   Best Practice (right click and Reselect all SmartList Selections daily)   Diet/type: Tube feeds DVT prophylaxis LMWH GI prophylaxis: PPI Lines: PICC line, A-line Foley:  Yes, and it is still needed Code Status:  full code Last date of multidisciplinary goals of care discussion [9/1-discussed with patient's daughter]  Critical care time:   The patient is critically ill with multiple organ systems failure and requires high complexity decision making for assessment and support, frequent evaluation and titration of therapies, application of advanced monitoring technologies and extensive interpretation of multiple databases. Critical Care Time devoted to patient care services described in this note independent of APP/resident time (if applicable)  is 33 minutes.   Jennet Epley MD [facility] Personal pager: See Amion If unanswered, please page CCM On-call: #[phone]

## 2023-09-05 ENCOUNTER — Inpatient Hospital Stay (HOSPITAL_COMMUNITY)

## 2023-09-05 DIAGNOSIS — N179 Acute kidney failure, unspecified: Secondary | ICD-10-CM

## 2023-09-05 DIAGNOSIS — Z515 Encounter for palliative care: Secondary | ICD-10-CM | POA: Diagnosis not present

## 2023-09-05 DIAGNOSIS — U071 COVID-19: Secondary | ICD-10-CM | POA: Diagnosis not present

## 2023-09-05 DIAGNOSIS — L8990 Pressure ulcer of unspecified site, unspecified stage: Secondary | ICD-10-CM | POA: Insufficient documentation

## 2023-09-05 DIAGNOSIS — J9602 Acute respiratory failure with hypercapnia: Secondary | ICD-10-CM | POA: Diagnosis not present

## 2023-09-05 LAB — BASIC METABOLIC PANEL WITH GFR
Anion gap: 14 (ref 5–15)
Anion gap: 13 (ref 5–15)
BUN: 129 mg/dL — ABNORMAL HIGH (ref 8–23)
BUN: 127 mg/dL — ABNORMAL HIGH (ref 8–23)
CO2: 31 mmol/L (ref 22–32)
CO2: 30 mmol/L (ref 22–32)
Calcium: 8.1 mg/dL — ABNORMAL LOW (ref 8.9–10.3)
Calcium: 8 mg/dL — ABNORMAL LOW (ref 8.9–10.3)
Chloride: 105 mmol/L (ref 98–111)
Chloride: 105 mmol/L (ref 98–111)
Creatinine, Ser: 1.67 mg/dL — ABNORMAL HIGH (ref 0.44–1.00)
Creatinine, Ser: 1.56 mg/dL — ABNORMAL HIGH (ref 0.44–1.00)
GFR, Estimated: 32 mL/min — ABNORMAL LOW (ref >60)
GFR, Estimated: 35 mL/min — ABNORMAL LOW (ref >60)
Glucose, Bld: 166 mg/dL — ABNORMAL HIGH (ref 70–99)
Glucose, Bld: 211 mg/dL — ABNORMAL HIGH (ref 70–99)
Potassium: 4.7 mmol/L (ref 3.5–5.1)
Potassium: 4.6 mmol/L (ref 3.5–5.1)
Sodium: 149 mmol/L — ABNORMAL HIGH (ref 135–145)
Sodium: 147 mmol/L — ABNORMAL HIGH (ref 135–145)

## 2023-09-05 LAB — CBC WITH DIFFERENTIAL/PLATELET
Abs Immature Granulocytes: 0.79 K/uL — ABNORMAL HIGH (ref 0.00–0.07)
Basophils Absolute: 0 K/uL (ref 0.0–0.1)
Basophils Relative: 0 %
Eosinophils Absolute: 0 K/uL (ref 0.0–0.5)
Eosinophils Relative: 0 %
HCT: 26.7 % — ABNORMAL LOW (ref 36.0–46.0)
Hemoglobin: 7.9 g/dL — ABNORMAL LOW (ref 12.0–15.0)
Immature Granulocytes: 5 %
Lymphocytes Relative: 5 %
Lymphs Abs: 0.7 K/uL (ref 0.7–4.0)
MCH: 26.4 pg (ref 26.0–34.0)
MCHC: 29.6 g/dL — ABNORMAL LOW (ref 30.0–36.0)
MCV: 89.3 fL (ref 80.0–100.0)
Monocytes Absolute: 1.2 K/uL — ABNORMAL HIGH (ref 0.1–1.0)
Monocytes Relative: 8 %
Neutro Abs: 12.6 K/uL — ABNORMAL HIGH (ref 1.7–7.7)
Neutrophils Relative %: 82 %
Platelets: 127 K/uL — ABNORMAL LOW (ref 150–400)
RBC: 2.99 MIL/uL — ABNORMAL LOW (ref 3.87–5.11)
RDW: 18.2 % — ABNORMAL HIGH (ref 11.5–15.5)
WBC: 15.2 K/uL — ABNORMAL HIGH (ref 4.0–10.5)
nRBC: 1.3 % — ABNORMAL HIGH (ref 0.0–0.2)

## 2023-09-05 LAB — TRIGLYCERIDES: Triglycerides: 217 mg/dL — ABNORMAL HIGH (ref <150)

## 2023-09-05 LAB — MAGNESIUM: Magnesium: 3.5 mg/dL — ABNORMAL HIGH (ref 1.7–2.4)

## 2023-09-05 LAB — GLUCOSE, CAPILLARY
Glucose-Capillary: 135 mg/dL — ABNORMAL HIGH (ref 70–99)
Glucose-Capillary: 141 mg/dL — ABNORMAL HIGH (ref 70–99)
Glucose-Capillary: 157 mg/dL — ABNORMAL HIGH (ref 70–99)
Glucose-Capillary: 160 mg/dL — ABNORMAL HIGH (ref 70–99)
Glucose-Capillary: 196 mg/dL — ABNORMAL HIGH (ref 70–99)
Glucose-Capillary: 199 mg/dL — ABNORMAL HIGH (ref 70–99)
Glucose-Capillary: 206 mg/dL — ABNORMAL HIGH (ref 70–99)

## 2023-09-05 LAB — PHOSPHORUS: Phosphorus: 5.2 mg/dL — ABNORMAL HIGH (ref 2.5–4.6)

## 2023-09-05 MED ORDER — REVEFENACIN 175 MCG/3ML IN SOLN
175.0000 ug | Freq: Every day | RESPIRATORY_TRACT | Status: DC
  Administered 2023-09-05 – 2023-09-09 (×5): 175 ug via RESPIRATORY_TRACT
  Filled 2023-09-05 (×5): qty 3

## 2023-09-05 MED ORDER — DEXMEDETOMIDINE HCL IN NACL 400 MCG/100ML IV SOLN
0.0000 ug/kg/h | INTRAVENOUS | Status: DC
  Administered 2023-09-05: 0.4 ug/kg/h via INTRAVENOUS
  Administered 2023-09-05: 0.6 ug/kg/h via INTRAVENOUS
  Administered 2023-09-05: 0.5 ug/kg/h via INTRAVENOUS
  Administered 2023-09-06 (×4): 0.7 ug/kg/h via INTRAVENOUS
  Administered 2023-09-06: 0.6 ug/kg/h via INTRAVENOUS
  Administered 2023-09-07: 0.7 ug/kg/h via INTRAVENOUS
  Administered 2023-09-07: 0.9 ug/kg/h via INTRAVENOUS
  Filled 2023-09-05 (×10): qty 100

## 2023-09-05 MED ORDER — ARFORMOTEROL TARTRATE 15 MCG/2ML IN NEBU
15.0000 ug | INHALATION_SOLUTION | Freq: Two times a day (BID) | RESPIRATORY_TRACT | Status: DC
  Administered 2023-09-05 – 2023-09-09 (×10): 15 ug via RESPIRATORY_TRACT
  Filled 2023-09-05 (×10): qty 2

## 2023-09-05 MED ORDER — FUROSEMIDE 10 MG/ML IJ SOLN
80.0000 mg | Freq: Once | INTRAMUSCULAR | Status: AC
  Administered 2023-09-05: 80 mg via INTRAVENOUS
  Filled 2023-09-05: qty 8

## 2023-09-05 MED ORDER — FREE WATER
200.0000 mL | Freq: Four times a day (QID) | Status: DC
  Administered 2023-09-05: 200 mL

## 2023-09-05 MED ORDER — FUROSEMIDE 10 MG/ML IJ SOLN: 40.0000 mg | Freq: Two times a day (BID) | INTRAMUSCULAR | Status: DC

## 2023-09-05 NOTE — Progress Notes (Signed)
 PT Cancellation Note  Patient Details Name: Milo Solana MRN: [id number] DOB: Jul 15, 1950   Cancelled Treatment:    Reason Eval/Treat Not Completed: Medical issues which prohibited therapy  Remains on vent.  Darice Potters PT Acute Rehabilitation Services Office [phone]  Potters Darice Norris 09/05/2023, 1:33 PM

## 2023-09-05 NOTE — Progress Notes (Signed)
 NAME:  Morgan Fischer, MRN:  [id number], DOB:  1950-08-05, LOS: 10 ADMISSION DATE:  08/26/2023, CONSULTATION DATE: 08/26/2023 REFERRING MD: Redia - TRH, CHIEF COMPLAINT: Acute on chronic hypoxemic/hypercarbic respiratory failure  History of Present Illness:  Patient with known history of chronic respiratory failure on BiPAP at night for hypercapnic respiratory failure, history of chronic diastolic congestive heart failure Transferred from med [facility] where she had presented with worsening shortness of breath of about 5 days duration She has had a cough, no sputum production, no chest pain or chest discomfort No fevers or chills No contact with anyone with a febrile illness No change in bowel habits, no dysuria  Recently followed up at [facility] last Wednesday and prior to that visit was feeling fine but she felt that there may have been people who was sick in the waiting area  Normally on 2 L of oxygen  during the day but in the last few weeks oxygen  levels were increased to 3 L because of desaturations  History of hypertrophic cardiomyopathy, chronic kidney disease stage IIIa, type 2 diabetes asthma/COPD, morbid obesity, usual activity tolerance about 20-30 steps, never smoker  Has been compliant with her usual medications  Tested positive for COVID but does not recollect being around anybody with acute febrile illness  Pertinent Medical History:   Past Medical History:  Diagnosis Date   Acute diastolic heart failure (HCC) 07/17/2019   Acute hypercapnic respiratory failure (HCC) 07/21/2016   Acute on chronic congestive heart failure (HCC) 07/19/2016   Acute on chronic diastolic CHF (congestive heart failure) (HCC) 03/08/2020   Acute on chronic respiratory failure with hypoxia (HCC) 10/20/2018   Acute on chronic respiratory failure with hypoxia and hypercapnia (HCC) 03/08/2020   Acute respiratory acidosis (HCC) 07/19/2016   Acute respiratory distress 07/19/2016   Acute  respiratory failure (HCC) 03/08/2020   Anxiety 03/15/2020   Aortic stenosis 11/01/2018   Arthritis    Asthma    Atherosclerosis of native coronary artery of native heart without angina pectoris 03/17/2016   Cardiomyopathy (HCC) 03/15/2020   Chest pain 07/14/2019   CHF (congestive heart failure) (HCC)    Chronic diastolic heart failure (HCC) 03/17/2016   Chronic hypoxemic respiratory failure (HCC)    Chronic kidney disease, stage 3b (HCC) 02/04/2021   CKD (chronic kidney disease)    COPD (chronic obstructive pulmonary disease) (HCC)    COPD with acute exacerbation (HCC) 03/08/2020   Diabetes mellitus without complication (HCC)    Drug allergy 07/21/2016   Essential (primary) hypertension 11/19/2016   Essential hypertension 11/19/2016   Gout 11/19/2016   Heart block AV complete (HCC)    High cholesterol    History of gout 04/05/2023   HOCM (hypertrophic obstructive cardiomyopathy) (HCC) 09/18/2017   Hyperlipidemia associated with type 2 diabetes mellitus (HCC) 11/23/2022   Hyperlipidemia, unspecified 03/15/2020   Hypertension    Hypertensive heart disease with heart failure (HCC) 03/17/2016   Hypertensive heart disease without congestive heart failure 03/15/2020   Hypertrophic cardiomyopathy (HCC) 04/12/2020   Hypokalemia 08/24/2015   Hypomagnesemia    Hypoxia 03/17/2016   Insomnia 11/23/2022   Iron deficiency 03/15/2020   Leukocytosis 07/19/2016   Long term (current) use of insulin  (HCC) 02/04/2021   Mild intermittent asthma 02/04/2021   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   Mixed hyperlipidemia 03/15/2020   Morbid obesity (HCC)    Morbid obesity with BMI of 60.0-69.9, adult (HCC) 07/21/2016   Need for influenza vaccination 11/23/2022   Need for pneumococcal 20-valent conjugate vaccination 11/23/2022  Nonrheumatic aortic valve stenosis 03/17/2016   Obesity hypoventilation syndrome (HCC) 03/08/2020   Obstructive sleep apnea 11/19/2016   Osteoarthritis of knee 03/15/2020    Panniculitis 08/23/2015   Peripheral venous insufficiency 03/15/2020   Pure hypercholesterolemia 02/13/2017   SOB (shortness of breath) 08/24/2015   Troponin level elevated 08/24/2015   Type 2 diabetes mellitus with hyperglycemia (HCC) 07/19/2016   Type 2 diabetes mellitus with stage 3 chronic kidney disease, with long-term current use of insulin  (HCC) 11/19/2016   Type 2 diabetes mellitus without complication, without long-term current use of insulin  (HCC) 04/12/2020   Type 2 diabetes mellitus without complications (HCC) 03/15/2020   Type 2 diabetes mellitus, without long-term current use of insulin  (HCC) 11/19/2016   Significant Hospital Events: Including procedures, antibiotic start and stop dates in addition to other pertinent events   8/24 CXR with pulmonary vascular congestion 8/25 No acute issues overnight, wore BIPAP. pCO2 remains elevated to 100. Wakes up appropriately but dozes back off. 8/27 intubated  8/28 Overnight, anisocoria noted prompted CT Head . Negative  8/29 some issues with hypertension overnight  8/30-fever 102, cultures drawn-escalated antibiotics 9/1 failed weaning 9/2 only weaned on vent for 30 mins, on low dose fentanyl  gtt  9/3 Currently weaning 10/5, on low amounts of sedation- started propofol  overnight for increased agitation/work of breathing on vent    Antibiotics: Ceftriaxone  8/24-8/25 Doxycycline  8/24-8/29 Zosyn  8/26-8/30 Meropenem  8/30>>9/3 Linezolid  8/27>>9/2  BAL 8/27-no growth seen  Blood culture 8/30-no growth to date   Tracheal aspirate Acid Fast Smear pending   Interim History / Subjective:  Continues to follow commands Very deconditioned, weak  Currently weaning 10/5   Started on propofol  gtt overnight due to agitation, increased WOB  On low dose fentanyl  gtt   Objective:    Blood pressure (!) 155/61, pulse 69, temperature 98.6 F (37 C), resp. rate (!) 24, height 5' 3 (1.6 m), weight (!) 137.3 kg, SpO2 99%.    Vent Mode:  PSV FiO2 (%):  [45 %] 45 % Set Rate:  [24 bmp] 24 bmp Vt Set:  [410 mL] 410 mL PEEP:  [5 cmH20-6 cmH20] 5 cmH20 Pressure Support:  [10 cmH20] 10 cmH20 Plateau Pressure:  [24 cmH20-26 cmH20] 25 cmH20   Intake/Output Summary (Last 24 hours) at 09/05/2023 0932 Last data filed at 09/05/2023 9196 Gross per 24 hour  Intake 1938.16 ml  Output 89254 ml  Net -8806.84 ml   Filed Weights   09/03/23 0442 09/04/23 0500 09/05/23 0500  Weight: (!) 144.4 kg 127.6 kg (!) 137.3 kg   Physical Examination: General: acute on chronic ill-appearing older adult female, lying in icu bed on vent in NAD HEENT: Normocephalic, PERRLA intact, ETT, OG, Pink MM, poor dentition CV: s1,s2, RRR, no MRG, No JVD, arterial line pulm: clear, diminished, no distress on vent Abs: bs active, soft  Extremities: Generalized edema, no deformity, moves all extremities on command, severely deconditioned/weak Skin: no rash  Neuro: Rass 0 to -1, responds to painful stimuli, cough gag reflex present  GU: foley intact    Resolved Problem List:  Shock   Assessment and Plan:   Acute on chronic hypoxemic/hypercapnic respiratory failure COVID-positive COPD with exacerbation - Intubated for hypercapnic respiratory failure on 8/27 P:  Continue ventilator support and lung protective strategies  Continue LTVV  Wean PEEP and Fio2 requirements to sat goal of >92%  HOB > 30 degrees Plat < 30  Aim for Driving pressures < 15  Intermittent Chest X-ray and ABGS Obtain and follow cultures-blood  and tracheal aspirate VAP and PAD protocols in place  Wean sedation as tolerated, SBT and WUA daily- dc propofol  gtt  Continue fentanyl  gtt, will add low dose precedex  instead of propofol   Continue GOC discussions- see ipal note on 9/2. Will discuss with patient's daughter again today in regards to patient failing weans, currently 10/5 but now having increased WOB- will have RT change back to full support  Continue Steroids, continue meropenem   for total of 5 days- should stop today  Linezolid  discontinued yesterday on 9/2  Discontinue aline  Hold diuresis- patient is net negative > 4Liters   Hypertrophic cardiomyopathy Chronic diastolic heart failure - Echocardiogram 8/25 with ejection fraction of 70-75%, hyperdynamic function, severe concentric LVH, grade 2 diastolic dysfunction with mildly enlarged right ventricle P: Continue ASA/Statin   Acute kidney injury on chronic kidney disease stage IIIa Acute Kidney Injury  Hypernatremia  Cr 2.23>1.91>1.60>1.55>1.92>1.87>1.81> 1.61 P: Continue to trend renal function daily  Continue to monitor and optimize electrolytes daily Continue to monitor urine output Continue strict I/Os Continue Adequate renal perfusion  Avoid nephrotoxic agents  Consider nephro consult   Follow up with Morning  Will add free water  200 ml, q6,  trend sodium daily   Type 2 diabetes P: Continue SSI Continue Lantus  18 units daily  Continue CBG q 4 Continue tube feeds   GOC:  Risk of decompensation remains very high Limited pulmonary reserve at baseline Not weaning successfully  patient's daughter updated on 9/1  Context of chronic lung and heart condition, not able to wean at present May require tracheostomy, feeding tube, long-term vent Compromised kidney function is acute on chronic and may mean she is permanently on dialysis going forward See- Ipal note on 9/2 P: Discussed with daughter/family that patient continues to fail wean, suspect patient will need a tracheostomy. Per daughter, she is not sure if mother would want a tracheostomy and live on a machine the rest of her life or in a facility. Palliative care continues to follow, did recommend full DNR to daughter- she is currently discussing with other close family members in regards to [facility] plans. Will update later today on 9/3 when they come to beside.   Best Practice (right click and Reselect all SmartList Selections daily)    Diet/type: Tube feeds DVT prophylaxis LMWH GI prophylaxis: PPI Lines: PICC line, A-line (remove aline)  Foley:  Yes, and it is still needed Code Status:  full code Last date of multidisciplinary goals of care discussion [  See ipal note on 9/3   Critical care time: 50 mins    Christian Roneka Gilpin AGACNP-BC   [place] [facility] 09/05/2023, 9:51 AM  Please see [email or web address] for pager details.  From 7A-7P if no response, please call [phone]. After hours, please call ELink [phone].

## 2023-09-05 NOTE — Progress Notes (Signed)
  Daily Progress Note   Patient Name: Michaelle Bottomley       Date: 09/05/2023 DOB: 1950/10/11  Age: 73 y.o. MRN#: [id number] Attending Physician: Zaida Zola SAILOR, MD Primary Care Physician: Antonio Meth, [name] SAUNDERS, DO Admit Date: 08/26/2023 Length of Stay: 10 days  Reason for Consultation/Follow-up: Establishing goals of care  Subjective:   CC: Patient remains intubated and sedated.  Following up regarding complex medical decision making.  Subjective:  Reviewed EMR including recent documentation from [facility] provider.     Objective:   Vital Signs:  BP (!) 134/50   Pulse 71   Temp 99.5 F (37.5 C) (Esophageal)   Resp (!) 24   Ht 5' 3 (1.6 m)   Wt (!) 137.3 kg   LMP  (LMP Unknown)   SpO2 96%   BMI 53.62 kg/m   Physical Exam: General: Sedated on ventilator support Cardiovascular: Bradycardia noted Respiratory: Intubated on ventilator support  Neuro: Sedated  Assessment & Plan:   Assessment: Patient is a 73 year old female with a past medical history of chronic hypoxic and hypercapnic respiratory failure on 2 L nasal cannula and daytime and nighttime BiPAP, HOCM, HFpEF, COPD, diabetes mellitus type 2, hyperlipidemia, and CKD who was admitted on 08/26/2023 for management of shortness of breath and cough.  During hospitalization, patient required BiPAP support for acute on chronic hypoxic and hypercapnic respiratory failure in setting of COVID-19 infection diagnosis and heart failure and COPD exacerbation.  Patient was transferred to stepdown unit on 08/28/2023 due to worsening respiratory status.  Palliative medicine team consulted to assist with complex medical decision making.   Recommendations/Plan: # Complex medical decision making/goals of care:                    -  Code Status: Full Code       Call placed and was able to reach daughter [name], discussed with her about patient's current condition, code status and next steps, decision regarding tracheostomy. Offered her support,  she asks for some time and space for reflection and for the family to discuss among themselves. She is thankful for the information she has received from the [facility] staff.  [name] is aware that the patient is not tolerating weaning trials.     Remains full code.  # Psycho-social/Spiritual Support:  - Support System: Daughter- Gearldean Lomanto, son, siblings   # Discharge Planning:  To Be Determined  Thank you for allowing the palliative care team to participate in the care [facility].  Mod MDM Lonia Serve MD.  Palliative Care Provider PMT # [phone]  If patient remains symptomatic despite maximum doses, please call PMT at [phone] between 0700 and 1900. Outside of these hours, please call attending, as PMT does not have night coverage.

## 2023-09-05 NOTE — Progress Notes (Signed)
 Interim CCM Progress Note Chest x-ray this morning around 1000, showing pulmonary edema vs worsening infiltrates  Concern for pulmonary edema, but could be worsening infiltrates secondary to Covid-19.  If can diuresis well, may be able to allow patient to wean effectively on vent  P:  Will give 80 mg of lasix  x 1 now, if responds well- will give additional dose in next 12hrs.  Repeat Chest X-ray in AM  May need CT scan of chest tomorrow on 9/4 if does not respond to diuresis   Updated daughter again, in regards to patient not tolerating wean. Family would like more time before decision of tracheostomy, to see if weaning will improve. Continue to update family daily, appreciate palliative care assistance at this time.     Sherlean Sharps AGACNP-BC    Pulmonary & Critical Care 09/05/2023, 12:32 PM  Please see [email or web address] for pager details.  From 7A-7P if no response, please call [phone]. After hours, please call ELink [phone].

## 2023-09-06 ENCOUNTER — Inpatient Hospital Stay (HOSPITAL_COMMUNITY)

## 2023-09-06 ENCOUNTER — Institutional Professional Consult (permissible substitution)

## 2023-09-06 DIAGNOSIS — Z7189 Other specified counseling: Secondary | ICD-10-CM | POA: Diagnosis not present

## 2023-09-06 DIAGNOSIS — J9602 Acute respiratory failure with hypercapnia: Secondary | ICD-10-CM | POA: Diagnosis not present

## 2023-09-06 DIAGNOSIS — J441 Chronic obstructive pulmonary disease with (acute) exacerbation: Secondary | ICD-10-CM | POA: Diagnosis not present

## 2023-09-06 DIAGNOSIS — R531 Weakness: Secondary | ICD-10-CM

## 2023-09-06 DIAGNOSIS — Z515 Encounter for palliative care: Secondary | ICD-10-CM | POA: Diagnosis not present

## 2023-09-06 DIAGNOSIS — R918 Other nonspecific abnormal finding of lung field: Secondary | ICD-10-CM

## 2023-09-06 LAB — CULTURE, BLOOD (ROUTINE X 2)
Culture: NO GROWTH
Culture: NO GROWTH

## 2023-09-06 LAB — CBC WITH DIFFERENTIAL/PLATELET
Abs Immature Granulocytes: 0.83 K/uL — ABNORMAL HIGH (ref 0.00–0.07)
Basophils Absolute: 0 K/uL (ref 0.0–0.1)
Basophils Relative: 0 %
Eosinophils Absolute: 0 K/uL (ref 0.0–0.5)
Eosinophils Relative: 0 %
HCT: 26.1 % — ABNORMAL LOW (ref 36.0–46.0)
Hemoglobin: 7.8 g/dL — ABNORMAL LOW (ref 12.0–15.0)
Immature Granulocytes: 5 %
Lymphocytes Relative: 4 %
Lymphs Abs: 0.7 K/uL (ref 0.7–4.0)
MCH: 26.9 pg (ref 26.0–34.0)
MCHC: 29.9 g/dL — ABNORMAL LOW (ref 30.0–36.0)
MCV: 90 fL (ref 80.0–100.0)
Monocytes Absolute: 1.2 K/uL — ABNORMAL HIGH (ref 0.1–1.0)
Monocytes Relative: 7 %
Neutro Abs: 14.6 K/uL — ABNORMAL HIGH (ref 1.7–7.7)
Neutrophils Relative %: 84 %
Platelets: 111 K/uL — ABNORMAL LOW (ref 150–400)
RBC: 2.9 MIL/uL — ABNORMAL LOW (ref 3.87–5.11)
RDW: 18.5 % — ABNORMAL HIGH (ref 11.5–15.5)
WBC: 17.3 K/uL — ABNORMAL HIGH (ref 4.0–10.5)
nRBC: 0.9 % — ABNORMAL HIGH (ref 0.0–0.2)

## 2023-09-06 LAB — BASIC METABOLIC PANEL WITH GFR
Anion gap: 14 (ref 5–15)
BUN: 126 mg/dL — ABNORMAL HIGH (ref 8–23)
CO2: 29 mmol/L (ref 22–32)
Calcium: 7.9 mg/dL — ABNORMAL LOW (ref 8.9–10.3)
Chloride: 107 mmol/L (ref 98–111)
Creatinine, Ser: 1.52 mg/dL — ABNORMAL HIGH (ref 0.44–1.00)
GFR, Estimated: 36 mL/min — ABNORMAL LOW (ref >60)
Glucose, Bld: 193 mg/dL — ABNORMAL HIGH (ref 70–99)
Potassium: 4.8 mmol/L (ref 3.5–5.1)
Sodium: 150 mmol/L — ABNORMAL HIGH (ref 135–145)

## 2023-09-06 LAB — GLUCOSE, CAPILLARY
Glucose-Capillary: 142 mg/dL — ABNORMAL HIGH (ref 70–99)
Glucose-Capillary: 145 mg/dL — ABNORMAL HIGH (ref 70–99)
Glucose-Capillary: 148 mg/dL — ABNORMAL HIGH (ref 70–99)
Glucose-Capillary: 172 mg/dL — ABNORMAL HIGH (ref 70–99)
Glucose-Capillary: 178 mg/dL — ABNORMAL HIGH (ref 70–99)
Glucose-Capillary: 211 mg/dL — ABNORMAL HIGH (ref 70–99)
Glucose-Capillary: 228 mg/dL — ABNORMAL HIGH (ref 70–99)

## 2023-09-06 LAB — MAGNESIUM: Magnesium: 3.5 mg/dL — ABNORMAL HIGH (ref 1.7–2.4)

## 2023-09-06 LAB — PHOSPHORUS: Phosphorus: 5.6 mg/dL — ABNORMAL HIGH (ref 2.5–4.6)

## 2023-09-06 MED ORDER — INSULIN GLARGINE 100 UNIT/ML ~~LOC~~ SOLN
20.0000 [IU] | Freq: Every day | SUBCUTANEOUS | Status: DC
  Administered 2023-09-06 – 2023-09-07 (×2): 20 [IU] via SUBCUTANEOUS
  Filled 2023-09-06 (×3): qty 0.2

## 2023-09-06 MED ORDER — MIDAZOLAM HCL 2 MG/2ML IJ SOLN
2.0000 mg | INTRAMUSCULAR | Status: DC | PRN
  Administered 2023-09-06 (×2): 2 mg via INTRAVENOUS
  Filled 2023-09-06 (×2): qty 2

## 2023-09-06 NOTE — Progress Notes (Signed)
 Patient had a bedside bronchoscopy, RT assist with procedure.RT instilled saline to obtain an BAL. Patient was on 100% oxygen  during procedure, SPO2 96% during procedure. Patient tolerated well . Sample delivered to lab.

## 2023-09-06 NOTE — Progress Notes (Signed)
 Interim CCM Progress Notes:   Called daughter to discuss findings of bronchoscopy. Provided that airway mucosa was severely inflamed and friable throughout the bronchial tree. Discussed that we suspect this is related to Covid-19 unfortunately. Copious clear non thick white secretions obtained during BAL, sent for culture.   From a respiratory standpoint, patient has not improved, failed wean and unfortunately not able to wean during this morning's WUA due to showing signs of increased WOB, accessory muscle use, developing distress.  CT of chest  - diffuse multifocal airspace bilaterally- pulmonary edema vs infection. Cardiomegaly with dilated pulmonary artery-chronic pulmonary HTN.  Unable to give additional lasix  due renal function, elevated BUN.   Currently off abx-meropenem  stopped on 9/3, currently temperature curve stable, with no uptrend since 9/3, WBC 13.6>15.2>17.3, over the last 3 draws- elevation could be related to steroid use  If patient develops new fever, or WBC begins to uptrend, would consider abx utilization.   Re-discussed GOC with daughter, recommend DNR/DNI- daughter receptive to information but needs to re-discuss with family. If patient is able to make some improvement, patient will need tracheostomy- discussed this with daughter as well. Daughter needing more time to discuss with family.   Sherlean Sharps AGACNP-BC   [place] [facility] 09/06/2023, 5:51 PM  Please see [email or web address] for pager details.  From 7A-7P if no response, please call [phone]. After hours, please call ELink [phone].

## 2023-09-06 NOTE — Progress Notes (Signed)
 Transported patient to CT approx. 12:10, returned without incident. Patient was bagged and lavaged when arriving to her room, blood tinted secretions noticeable. Patient returned to previous settings and is currently resting.

## 2023-09-06 NOTE — Progress Notes (Signed)
 NAME:  Morgan Fischer, MRN:  [id number], DOB:  1950-12-03, LOS: 11 ADMISSION DATE:  08/26/2023, CONSULTATION DATE: 08/26/2023 REFERRING MD: Redia - TRH, CHIEF COMPLAINT: Acute on chronic hypoxemic/hypercarbic respiratory failure  History of Present Illness:  Patient with known history of chronic respiratory failure on BiPAP at night for hypercapnic respiratory failure, history of chronic diastolic congestive heart failure Transferred from med [facility] where she had presented with worsening shortness of breath of about 5 days duration She has had a cough, no sputum production, no chest pain or chest discomfort No fevers or chills No contact with anyone with a febrile illness No change in bowel habits, no dysuria  Recently followed up at [facility] last Wednesday and prior to that visit was feeling fine but she felt that there may have been people who was sick in the waiting area  Normally on 2 L of oxygen  during the day but in the last few weeks oxygen  levels were increased to 3 L because of desaturations  History of hypertrophic cardiomyopathy, chronic kidney disease stage IIIa, type 2 diabetes asthma/COPD, morbid obesity, usual activity tolerance about 20-30 steps, never smoker  Has been compliant with her usual medications  Tested positive for COVID but does not recollect being around anybody with acute febrile illness  Pertinent Medical History:   Past Medical History:  Diagnosis Date   Acute diastolic heart failure (HCC) 07/17/2019   Acute hypercapnic respiratory failure (HCC) 07/21/2016   Acute on chronic congestive heart failure (HCC) 07/19/2016   Acute on chronic diastolic CHF (congestive heart failure) (HCC) 03/08/2020   Acute on chronic respiratory failure with hypoxia (HCC) 10/20/2018   Acute on chronic respiratory failure with hypoxia and hypercapnia (HCC) 03/08/2020   Acute respiratory acidosis (HCC) 07/19/2016   Acute respiratory distress 07/19/2016   Acute  respiratory failure (HCC) 03/08/2020   Anxiety 03/15/2020   Aortic stenosis 11/01/2018   Arthritis    Asthma    Atherosclerosis of native coronary artery of native heart without angina pectoris 03/17/2016   Cardiomyopathy (HCC) 03/15/2020   Chest pain 07/14/2019   CHF (congestive heart failure) (HCC)    Chronic diastolic heart failure (HCC) 03/17/2016   Chronic hypoxemic respiratory failure (HCC)    Chronic kidney disease, stage 3b (HCC) 02/04/2021   CKD (chronic kidney disease)    COPD (chronic obstructive pulmonary disease) (HCC)    COPD with acute exacerbation (HCC) 03/08/2020   Diabetes mellitus without complication (HCC)    Drug allergy 07/21/2016   Essential (primary) hypertension 11/19/2016   Essential hypertension 11/19/2016   Gout 11/19/2016   Heart block AV complete (HCC)    High cholesterol    History of gout 04/05/2023   HOCM (hypertrophic obstructive cardiomyopathy) (HCC) 09/18/2017   Hyperlipidemia associated with type 2 diabetes mellitus (HCC) 11/23/2022   Hyperlipidemia, unspecified 03/15/2020   Hypertension    Hypertensive heart disease with heart failure (HCC) 03/17/2016   Hypertensive heart disease without congestive heart failure 03/15/2020   Hypertrophic cardiomyopathy (HCC) 04/12/2020   Hypokalemia 08/24/2015   Hypomagnesemia    Hypoxia 03/17/2016   Insomnia 11/23/2022   Iron deficiency 03/15/2020   Leukocytosis 07/19/2016   Long term (current) use of insulin  (HCC) 02/04/2021   Mild intermittent asthma 02/04/2021   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   Mixed hyperlipidemia 03/15/2020   Morbid obesity (HCC)    Morbid obesity with BMI of 60.0-69.9, adult (HCC) 07/21/2016   Need for influenza vaccination 11/23/2022   Need for pneumococcal 20-valent conjugate vaccination 11/23/2022  Nonrheumatic aortic valve stenosis 03/17/2016   Obesity hypoventilation syndrome (HCC) 03/08/2020   Obstructive sleep apnea 11/19/2016   Osteoarthritis of knee 03/15/2020    Panniculitis 08/23/2015   Peripheral venous insufficiency 03/15/2020   Pure hypercholesterolemia 02/13/2017   SOB (shortness of breath) 08/24/2015   Troponin level elevated 08/24/2015   Type 2 diabetes mellitus with hyperglycemia (HCC) 07/19/2016   Type 2 diabetes mellitus with stage 3 chronic kidney disease, with long-term current use of insulin  (HCC) 11/19/2016   Type 2 diabetes mellitus without complication, without long-term current use of insulin  (HCC) 04/12/2020   Type 2 diabetes mellitus without complications (HCC) 03/15/2020   Type 2 diabetes mellitus, without long-term current use of insulin  (HCC) 11/19/2016   Significant Hospital Events: Including procedures, antibiotic start and stop dates in addition to other pertinent events   8/24 CXR with pulmonary vascular congestion 8/25 No acute issues overnight, wore BIPAP. pCO2 remains elevated to 100. Wakes up appropriately but dozes back off. 8/27 intubated  8/28 Overnight, anisocoria noted prompted CT Head . Negative  8/29 some issues with hypertension overnight  8/30-fever 102, cultures drawn-escalated antibiotics 9/1 failed weaning 9/2 only weaned on vent for 30 mins, on low dose fentanyl  gtt  9/3 continues to fail wean, Chest-ray much worse with pulmonary edema, lasix  x 2, tracheal aspirate culture gram stain obtained    Antibiotics: Ceftriaxone  8/24-8/25 Doxycycline  8/24-8/29 Zosyn  8/26-8/30 Meropenem  8/30>>9/3  Linezolid  8/27>>9/2  BAL 8/27-no growth seen  Blood culture 8/30-no growth to date   Tracheal aspirate Acid Fast Smear pending   Tracheal Aspirate Culture obtained on 9/3   Interim History / Subjective:  Increasing sedation requirements over last 24hrs- on precedex  and fentanyl  gtt  Not able to wean on ventilator this morning  Increase WOB noted while decreasing sedation on WUA  CT of chest ordered   Objective:    Blood pressure (!) 127/45, pulse 71, temperature 99.1 F (37.3 C), resp. rate (!) 24,  height 5' 3 (1.6 m), weight (!) 137.3 kg, SpO2 97%.    Vent Mode: PRVC FiO2 (%):  [40 %-45 %] 40 % Set Rate:  [24 bmp] 24 bmp Vt Set:  [410 mL] 410 mL PEEP:  [6 cmH20] 6 cmH20 Plateau Pressure:  [22 cmH20-24 cmH20] 24 cmH20   Intake/Output Summary (Last 24 hours) at 09/06/2023 0947 Last data filed at 09/06/2023 9063 Gross per 24 hour  Intake 3099.37 ml  Output 3120 ml  Net -20.63 ml   Filed Weights   09/03/23 0442 09/04/23 0500 09/05/23 0500  Weight: (!) 144.4 kg 127.6 kg (!) 137.3 kg   Physical Examination: General: acute on chronically ill appearing, lying in icu bed on vent  HEENT: Normocephalic, PERRLA intact, ETT, OG, Pink MM, poor dentition, teeth missing  CV: s1,s2, RRR, no MRG, No JVD  pulm: clear, diminished, no distress on vent  Abs: bs active, soft  Extremities: generalized edema, no deformity, moves all extremities on command Skin: no rash  Neuro: Rass -3, responds to painful stimuli, cough gag reflex present  GU: foley intact- yellow urine    Resolved Problem List:  Shock   Assessment and Plan:   Acute on chronic hypoxemic/hypercapnic respiratory failure COVID-positive COPD with exacerbation - Intubated for hypercapnic respiratory failure on 8/27 Chest X-ray obtained on 9/3 showing worsening pulmonary edema/ infiltrates  Lasix  give- 80mg  x 2, repeat Chest X-ray- minimal improvement noted  P:  Continue ventilator support and lung protective strategies  Continue LTVV  Wean PEEP and Fio2 requirements to  sat goal of >92%  HOB > 30 degrees Plat < 30  Aim for Driving pressures < 15  Intermittent Chest X-ray and ABGS Obtain and follow cultures-blood and tracheal aspirate VAP and PAD protocols in place  Wean sedation as tolerated, WUA- per RN, patient able to follow commands However, patient had significant increased WOB with sedation decreasing  Repeat tracheal aspirate obtained on 9/3 STAT CT of chest- follow up with results, patient may need bronchoscopy  later today Continue steroids   Hypertrophic cardiomyopathy Chronic diastolic heart failure - Echocardiogram 8/25 with ejection fraction of 70-75%, hyperdynamic function, severe concentric LVH, grade 2 diastolic dysfunction with mildly enlarged right ventricle P: Continue ASA, statin  Hold additional diuresis for now   Acute kidney injury on chronic kidney disease stage IIIa Acute Kidney Injury  Cr 2.23>1.91>1.60>1.55>1.92>1.87>1.81 After lasix  administered Cr 1.51, BUN 126  P: Continue to trend renal function daily  Continue to monitor and optimize electrolytes daily Continue to monitor urine output Continue strict I/Os Continue Adequate renal perfusion  Avoid nephrotoxic agents  Hold off diuresis for now, follow up with CT chest   Type 2 diabetes P: Continue SSI insulin   Continue long acting coverage, increase to 20 units per day   Continue tube feed coverage    GOC:  Risk of decompensation remains very high Limited pulmonary reserve at baseline Not weaning successfully  patient's daughter updated on 9/1  Context of chronic lung and heart condition, not able to wean at present May require tracheostomy, feeding tube, long-term vent Compromised kidney function is acute on chronic and may mean she is permanently on dialysis going forward P: Continue GOC discussions with daughter -patient unfortunately not progressing from a respiratory standpoint  Will obtain CT of chest, may need bronchoscopy  Palliative following appreciate assistance   Best Practice (right click and Reselect all SmartList Selections daily)   Diet/type: Tube feeds DVT prophylaxis LMWH GI prophylaxis: PPI Lines: PICC line, A-line Foley:  Yes, and it is still needed Code Status:  full code Last date of multidisciplinary goals of care discussion [9/1-discussed with patient's daughter]  Updated daughter on 9/3- Still remains full code, daughter and family need more time before making decision in  regards to tracheostomy   Critical care time: 70    Christian Daquan Crapps AGACNP-BC    Pulmonary & Critical Care 09/06/2023, 10:15 AM  Please see [email or web address] for pager details.  From 7A-7P if no response, please call [phone]. After hours, please call ELink [phone].

## 2023-09-06 NOTE — Progress Notes (Signed)
 OT Cancellation Note  Patient Details Name: Morgan Fischer MRN: [id number] DOB: 04/30/1950   Cancelled Treatment:    Reason Eval/Treat Not Completed: Medical issues which prohibited therapy Patient remains on vent and hold OT as per nursing this day. Will continue to follow acutely.   Saburo Luger OT/L Acute Rehabilitation Department  [phone]   09/06/2023, 10:12 AM

## 2023-09-06 NOTE — Procedures (Signed)
 Bronchoscopy Procedure Note  Morgan Fischer  [id number]  11-22-1950  Date:09/06/23  Time:5:13 PM   Provider Performing:Husam Hohn N Tyrisha Benninger   Procedure(s):  Flexible bronchoscopy with bronchial alveolar lavage (68375)  Indication(s) Hypoxia and bilateral lung infiltrates on CT chest   Consent Risks of the procedure as well as the alternatives and risks of each were explained to the patient and/or caregiver.  Consent for the procedure was obtained and is signed in the bedside chart  Anesthesia Moderate sedation in the ICU   Time Out Verified patient identification, verified procedure, site/side was marked, verified correct patient position, special equipment/implants available, medications/allergies/relevant history reviewed, required imaging and test results available.   Sterile Technique Usual hand hygiene, masks, gowns, and gloves were used   Procedure Description Bronchoscope advanced through endotracheal tube and into airway.  Airways were examined down to subsegmental level with findings noted below.   Following diagnostic evaluation, BAL(s) performed in 100 with normal saline and return of 25 fluid  Findings: Airway anatomy appears normal. [place] friab le and inflammed throughout the bronchial tree. Copious clear whitish non thick secretions suctioned. BAL performed from right middle lobe     Complications/Tolerance None; patient tolerated the procedure well. Chest X-ray is not needed post procedure.   EBL Minimal   Specimen(s) BAL sample sent for gram stain and culture

## 2023-09-06 NOTE — Progress Notes (Signed)
 PMT brief progress note.  Patient remains intubated and mechanically ventilated. BP (!) 143/54   Pulse 72   Temp 99.3 F (37.4 C)   Resp (!) 28   Ht 5' 3 (1.6 m)   Wt (!) 137.3 kg   LMP  (LMP Unknown)   SpO2 95%   BMI 53.62 kg/m  Labs noted.  CT chest to be done today Discussed with RN PCCM notes reviewed Chart reviewed As per discussions with daughter [name] on 09-05-23: family not ready to make a decision regarding tracheostomy placement, they did indicate gratitude for the information that has been given to them by [facility] staff. They asked for some time and space for reflection, and to hold discussions as a family.  Recommend continued critical care support.  PMT remains available for ongoing GOC discussions.  Low MDM Lonia Serve MD [facility] palliative.

## 2023-09-06 NOTE — Progress Notes (Signed)
 I met Morgan Fischer's family (daughter and sister and brother) in the waiting area and offered support.  They requested that we keep Christene and the family in prayer, but did not have other needs at this time.

## 2023-09-07 LAB — BASIC METABOLIC PANEL WITH GFR
Anion gap: 12 (ref 5–15)
BUN: 109 mg/dL — ABNORMAL HIGH (ref 8–23)
CO2: 25 mmol/L (ref 22–32)
Calcium: 6.9 mg/dL — ABNORMAL LOW (ref 8.9–10.3)
Chloride: 110 mmol/L (ref 98–111)
Creatinine, Ser: 1.29 mg/dL — ABNORMAL HIGH (ref 0.44–1.00)
GFR, Estimated: 43 mL/min — ABNORMAL LOW (ref >60)
Glucose, Bld: 142 mg/dL — ABNORMAL HIGH (ref 70–99)
Potassium: 4.1 mmol/L (ref 3.5–5.1)
Sodium: 147 mmol/L — ABNORMAL HIGH (ref 135–145)

## 2023-09-07 LAB — GLUCOSE, CAPILLARY
Glucose-Capillary: 141 mg/dL — ABNORMAL HIGH (ref 70–99)
Glucose-Capillary: 110 mg/dL — ABNORMAL HIGH (ref 70–99)
Glucose-Capillary: 167 mg/dL — ABNORMAL HIGH (ref 70–99)
Glucose-Capillary: 151 mg/dL — ABNORMAL HIGH (ref 70–99)
Glucose-Capillary: 139 mg/dL — ABNORMAL HIGH (ref 70–99)

## 2023-09-07 LAB — CBC WITH DIFFERENTIAL/PLATELET
Abs Immature Granulocytes: 0.56 K/uL — ABNORMAL HIGH (ref 0.00–0.07)
Basophils Absolute: 0 K/uL (ref 0.0–0.1)
Basophils Relative: 0 %
Eosinophils Absolute: 0 K/uL (ref 0.0–0.5)
Eosinophils Relative: 0 %
HCT: 22.7 % — ABNORMAL LOW (ref 36.0–46.0)
Hemoglobin: 6.3 g/dL — CL (ref 12.0–15.0)
Immature Granulocytes: 3 %
Lymphocytes Relative: 3 %
Lymphs Abs: 0.6 K/uL — ABNORMAL LOW (ref 0.7–4.0)
MCH: 25.6 pg — ABNORMAL LOW (ref 26.0–34.0)
MCHC: 27.8 g/dL — ABNORMAL LOW (ref 30.0–36.0)
MCV: 92.3 fL (ref 80.0–100.0)
Monocytes Absolute: 1 K/uL (ref 0.1–1.0)
Monocytes Relative: 6 %
Neutro Abs: 15.4 K/uL — ABNORMAL HIGH (ref 1.7–7.7)
Neutrophils Relative %: 88 %
Platelets: 120 K/uL — ABNORMAL LOW (ref 150–400)
RBC: 2.46 MIL/uL — ABNORMAL LOW (ref 3.87–5.11)
RDW: 18.6 % — ABNORMAL HIGH (ref 11.5–15.5)
WBC: 17.5 K/uL — ABNORMAL HIGH (ref 4.0–10.5)
nRBC: 0.8 % — ABNORMAL HIGH (ref 0.0–0.2)

## 2023-09-07 LAB — HEMOGLOBIN AND HEMATOCRIT, BLOOD
HCT: 24.3 % — ABNORMAL LOW (ref 36.0–46.0)
HCT: 25.2 % — ABNORMAL LOW (ref 36.0–46.0)
HCT: 24.5 % — ABNORMAL LOW (ref 36.0–46.0)
HCT: 24.5 % — ABNORMAL LOW (ref 36.0–46.0)
Hemoglobin: 7 g/dL — ABNORMAL LOW (ref 12.0–15.0)
Hemoglobin: 7.1 g/dL — ABNORMAL LOW (ref 12.0–15.0)
Hemoglobin: 7.1 g/dL — ABNORMAL LOW (ref 12.0–15.0)
Hemoglobin: 7 g/dL — ABNORMAL LOW (ref 12.0–15.0)

## 2023-09-07 LAB — BLOOD GAS, ARTERIAL
Acid-Base Excess: 9.3 mmol/L — ABNORMAL HIGH (ref 0.0–2.0)
Bicarbonate: 35.8 mmol/L — ABNORMAL HIGH (ref 20.0–28.0)
Drawn by: 31394
FIO2: 40 %
MECHVT: 410 mL
O2 Saturation: 95.9 %
PEEP: 5 cmH2O
Patient temperature: 37.6
RATE: 24 {breaths}/min
pCO2 arterial: 64 mmHg — ABNORMAL HIGH (ref 32–48)
pH, Arterial: 7.36 (ref 7.35–7.45)
pO2, Arterial: 85 mmHg (ref 83–108)

## 2023-09-07 LAB — MAGNESIUM: Magnesium: 3 mg/dL — ABNORMAL HIGH (ref 1.7–2.4)

## 2023-09-07 LAB — ABO/RH: ABO/RH(D): O POS

## 2023-09-07 MED ORDER — OXYCODONE HCL 5 MG PO TABS
5.0000 mg | ORAL_TABLET | ORAL | Status: DC
  Administered 2023-09-07 – 2023-09-10 (×16): 5 mg
  Filled 2023-09-07 (×16): qty 1

## 2023-09-07 MED ORDER — PROPOFOL 1000 MG/100ML IV EMUL
0.0000 ug/kg/min | INTRAVENOUS | Status: DC
  Administered 2023-09-07: 15 ug/kg/min via INTRAVENOUS
  Administered 2023-09-07: 25 ug/kg/min via INTRAVENOUS
  Administered 2023-09-07: 20 ug/kg/min via INTRAVENOUS
  Administered 2023-09-08: 40 ug/kg/min via INTRAVENOUS
  Administered 2023-09-08: 30 ug/kg/min via INTRAVENOUS
  Administered 2023-09-08 (×2): 25 ug/kg/min via INTRAVENOUS
  Administered 2023-09-08: 30 ug/kg/min via INTRAVENOUS
  Administered 2023-09-08 (×3): 40 ug/kg/min via INTRAVENOUS
  Administered 2023-09-09 (×5): 25 ug/kg/min via INTRAVENOUS
  Filled 2023-09-07 (×15): qty 100

## 2023-09-07 MED ORDER — IPRATROPIUM-ALBUTEROL 0.5-2.5 (3) MG/3ML IN SOLN
3.0000 mL | RESPIRATORY_TRACT | Status: DC
  Administered 2023-09-07 – 2023-09-09 (×17): 3 mL via RESPIRATORY_TRACT
  Filled 2023-09-07 (×16): qty 3

## 2023-09-07 MED ORDER — DEXTROSE 5 % IV SOLN
4.0000 mg | Freq: Once | INTRAVENOUS | Status: AC
  Administered 2023-09-07: 4 mg via INTRAVENOUS
  Filled 2023-09-07: qty 16

## 2023-09-07 MED ORDER — MIDAZOLAM HCL 2 MG/2ML IJ SOLN: 2.0000 mg | INTRAMUSCULAR | Status: DC | PRN

## 2023-09-07 NOTE — TOC Progression Note (Addendum)
 Transition of Care [facility]) - Progression Note    Patient Details  Name: Morgan Fischer MRN: [id number] Date of Birth: 01-Sep-1950  [facility] CM/SW Contact  Jon ONEIDA Anon, RN Phone Number: 09/07/2023, 2:14 PM  Clinical Narrative:    Pt remains on ventilator support. Palliative MD is following. MD discussing with family about the possibility of trach placement. IP Care Management will continue to follow.  Addendum: Pt has trach procedure scheduled for Monday 9/8. NCM spoke with pt daughter [name] about LTAC placement after procedure. Pt daughter is agreeable to LTAC placement recommendation. Pt daughter states she would like pt to go to LTAC at [facility]. NCM sent referral to [place], hospital liaison with [facility]. [name] accepts referral and will speak with pt daughter.       Expected Discharge Plan: Skilled Nursing Facility Barriers to Discharge: Continued Medical Work up               Expected Discharge Plan and Services In-house Referral: Hospice / Palliative Care Discharge Planning Services: CM Consult Post Acute Care Choice: Skilled Nursing Facility Living arrangements for the past 2 months: Single Family Home                 DME Arranged: N/A DME Agency: NA       HH Arranged: NA HH Agency: NA         Social Drivers of Health (SDOH) Interventions SDOH Screenings   Food Insecurity: No Food Insecurity (08/26/2023)  Housing: Low Risk  (08/26/2023)  Transportation Needs: No Transportation Needs (08/26/2023)  Utilities: Not At Risk (08/26/2023)  Alcohol Screen: Low Risk  (02/12/2023)  Depression (PHQ2-9): Low Risk  (02/13/2023)  Financial Resource Strain: Medium Risk (07/10/2023)  Physical Activity: Inactive (07/10/2023)  Social Connections: Moderately Integrated (08/26/2023)  Stress: Stress Concern Present (07/10/2023)  Tobacco Use: Low Risk  (08/26/2023)  Health Literacy: Adequate Health Literacy (02/13/2023)    Readmission Risk  Interventions    08/29/2023    3:35 PM  Readmission Risk Prevention Plan  Transportation Screening Complete  PCP or Specialist Appt within 3-5 Days Complete  HRI or Home Care Consult Complete  Social Work Consult for Recovery Care Planning/Counseling Complete  Palliative Care Screening Complete  Medication Review Oceanographer) Complete

## 2023-09-07 NOTE — Progress Notes (Signed)
   09/07/23 1630  Spiritual Encounters  Type of Visit Initial  Care provided to: Family  Referral source Chaplain team  Reason for visit Urgent spiritual support   I looked in on Morgan Fischer but did not disturb her. I engaged family in waiting area to offer spiritual care support. Family did not express any needs at this time but seemed to appreciate the check-in. I let them know a chaplain is on-call throughout the weekend. I offered my support and encouraged them in self-care and supporting one another.  Shaindy Reader L. Delores HERO.Div

## 2023-09-07 NOTE — Progress Notes (Signed)
 NAME:  Morgan Fischer, MRN:  [id number], DOB:  1950/04/18, LOS: 12 ADMISSION DATE:  08/26/2023, CONSULTATION DATE: 08/26/2023 REFERRING MD: Redia - TRH, CHIEF COMPLAINT: Acute on chronic hypoxemic/hypercarbic respiratory failure  History of Present Illness:  Patient with known history of chronic respiratory failure on BiPAP at night for hypercapnic respiratory failure, history of chronic diastolic congestive heart failure Transferred from med [facility] where she had presented with worsening shortness of breath of about 5 days duration She has had a cough, no sputum production, no chest pain or chest discomfort No fevers or chills No contact with anyone with a febrile illness No change in bowel habits, no dysuria  Recently followed up at [facility] last Wednesday and prior to that visit was feeling fine but she felt that there may have been people who was sick in the waiting area  Normally on 2 L of oxygen  during the day but in the last few weeks oxygen  levels were increased to 3 L because of desaturations  History of hypertrophic cardiomyopathy, chronic kidney disease stage IIIa, type 2 diabetes asthma/COPD, morbid obesity, usual activity tolerance about 20-30 steps, never smoker  Has been compliant with her usual medications  Tested positive for COVID but does not recollect being around anybody with acute febrile illness  Pertinent Medical History:   Past Medical History:  Diagnosis Date   Acute diastolic heart failure (HCC) 07/17/2019   Acute hypercapnic respiratory failure (HCC) 07/21/2016   Acute on chronic congestive heart failure (HCC) 07/19/2016   Acute on chronic diastolic CHF (congestive heart failure) (HCC) 03/08/2020   Acute on chronic respiratory failure with hypoxia (HCC) 10/20/2018   Acute on chronic respiratory failure with hypoxia and hypercapnia (HCC) 03/08/2020   Acute respiratory acidosis (HCC) 07/19/2016   Acute respiratory distress 07/19/2016   Acute  respiratory failure (HCC) 03/08/2020   Anxiety 03/15/2020   Aortic stenosis 11/01/2018   Arthritis    Asthma    Atherosclerosis of native coronary artery of native heart without angina pectoris 03/17/2016   Cardiomyopathy (HCC) 03/15/2020   Chest pain 07/14/2019   CHF (congestive heart failure) (HCC)    Chronic diastolic heart failure (HCC) 03/17/2016   Chronic hypoxemic respiratory failure (HCC)    Chronic kidney disease, stage 3b (HCC) 02/04/2021   CKD (chronic kidney disease)    COPD (chronic obstructive pulmonary disease) (HCC)    COPD with acute exacerbation (HCC) 03/08/2020   Diabetes mellitus without complication (HCC)    Drug allergy 07/21/2016   Essential (primary) hypertension 11/19/2016   Essential hypertension 11/19/2016   Gout 11/19/2016   Heart block AV complete (HCC)    High cholesterol    History of gout 04/05/2023   HOCM (hypertrophic obstructive cardiomyopathy) (HCC) 09/18/2017   Hyperlipidemia associated with type 2 diabetes mellitus (HCC) 11/23/2022   Hyperlipidemia, unspecified 03/15/2020   Hypertension    Hypertensive heart disease with heart failure (HCC) 03/17/2016   Hypertensive heart disease without congestive heart failure 03/15/2020   Hypertrophic cardiomyopathy (HCC) 04/12/2020   Hypokalemia 08/24/2015   Hypomagnesemia    Hypoxia 03/17/2016   Insomnia 11/23/2022   Iron deficiency 03/15/2020   Leukocytosis 07/19/2016   Long term (current) use of insulin  (HCC) 02/04/2021   Mild intermittent asthma 02/04/2021   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   Mixed hyperlipidemia 03/15/2020   Morbid obesity (HCC)    Morbid obesity with BMI of 60.0-69.9, adult (HCC) 07/21/2016   Need for influenza vaccination 11/23/2022   Need for pneumococcal 20-valent conjugate vaccination 11/23/2022  Nonrheumatic aortic valve stenosis 03/17/2016   Obesity hypoventilation syndrome (HCC) 03/08/2020   Obstructive sleep apnea 11/19/2016   Osteoarthritis of knee 03/15/2020    Panniculitis 08/23/2015   Peripheral venous insufficiency 03/15/2020   Pure hypercholesterolemia 02/13/2017   SOB (shortness of breath) 08/24/2015   Troponin level elevated 08/24/2015   Type 2 diabetes mellitus with hyperglycemia (HCC) 07/19/2016   Type 2 diabetes mellitus with stage 3 chronic kidney disease, with long-term current use of insulin  (HCC) 11/19/2016   Type 2 diabetes mellitus without complication, without long-term current use of insulin  (HCC) 04/12/2020   Type 2 diabetes mellitus without complications (HCC) 03/15/2020   Type 2 diabetes mellitus, without long-term current use of insulin  (HCC) 11/19/2016   Significant Hospital Events: Including procedures, antibiotic start and stop dates in addition to other pertinent events   8/24 CXR with pulmonary vascular congestion 8/25 No acute issues overnight, wore BIPAP. pCO2 remains elevated to 100. Wakes up appropriately but dozes back off. 8/27 intubated  8/28 Overnight, anisocoria noted prompted CT Head . Negative  8/29 some issues with hypertension overnight  8/30-fever 102, cultures drawn-escalated antibiotics 9/1 failed weaning 9/2 only weaned on vent for 30 mins, on low dose fentanyl  gtt  9/3 continues to fail wean, Chest-ray much worse with pulmonary edema, lasix  x 2, tracheal aspirate culture gram stain obtained  9/5 dysynchronous and agitated overnight on precedex , changed back to propofol     Antibiotics: Ceftriaxone  8/24-8/25 Doxycycline  8/24-8/29 Zosyn  8/26-8/30 Meropenem  8/30>>9/3  Linezolid  8/27>>9/2  BAL 8/27-no growth seen  Blood culture 8/30-no growth to date   Tracheal aspirate Acid Fast Smear pending   Tracheal Aspirate Culture obtained on 9/3   Interim History / Subjective:  Continued difficulty with sedation and vent dyssynchrony   Objective:    Blood pressure (!) 122/42, pulse 71, temperature 100.2 F (37.9 C), temperature source Core, resp. rate (!) 23, height 5' 3 (1.6 m), weight (!) 137.3  kg, SpO2 97%.    Vent Mode: PRVC FiO2 (%):  [40 %] 40 % Set Rate:  [24 bmp] 24 bmp Vt Set:  [410 mL] 410 mL PEEP:  [6 cmH20-8 cmH20] 8 cmH20 Pressure Support:  [10 cmH20] 10 cmH20 Plateau Pressure:  [20 cmH20-27 cmH20] 24 cmH20   Intake/Output Summary (Last 24 hours) at 09/07/2023 0933 Last data filed at 09/07/2023 9188 Gross per 24 hour  Intake 2885.73 ml  Output 1950 ml  Net 935.73 ml   Filed Weights   09/03/23 0442 09/04/23 0500 09/05/23 0500  Weight: (!) 144.4 kg 127.6 kg (!) 137.3 kg     General:  critically ill F intubated and sedated  HEENT: MM pink/moist, ETT in place Neuro: examined on propofol , RASS -1 CV: s1s2 rrr, no m/r/g PULM:  diminished air entry throughout without significant rhonchi or wheezing On PRVC 40% PEEP 8 GI: soft, obese  Extremities: warm/dry, 1+ edema      Labs: Hgb 7.0 Mag 3.0 Na 147 Creatinine 1.29 WBC 17k Glu 142 ABG pending   CT chest 9/4 IMPRESSION: 1. Limited examination. Redemonstration of diffuse multifocal airspace opacities bilaterally, likely combination of pulmonary edema and/or infection.   2.  Mediastinal lymphadenopathy, likely reactive.   3. Cardiomegaly and dilated main pulmonary artery, suggestive of chronic pulmonary hypertension.   Resolved Problem List:  Shock   Assessment and Plan:   Acute on chronic hypoxemic/hypercapnic respiratory failure COVID-positive COPD with exacerbation - Intubated for hypercapnic respiratory failure on 8/27 Chest X-ray obtained on 9/3 showing worsening pulmonary edema/ infiltrates  Diuresed with minimal improvement   P:  -mental status and vent dyssynchrony have been barriers to SBT, driving pressure 16 and peak pressure 28-29 today -repeat ABG pending, calculate p/f ratio -Continue ventilator support and lung protective strategies  -Continue LTVV, Wean PEEP and Fio2 requirements to sat goal of >92%  -HOB > 30 degrees, Plat < 30  -Aim for Driving pressures < 15,  Intermittent Chest X-ray and ABGS -follow blood cultures and tracheal aspirate, completed course of abx and not on antimicrobials right now -VAP and PAD protocols in place  -start enteral oxy in hopes of decreasing fentanyl  requirement -Continue steroids   Hypertrophic cardiomyopathy Chronic diastolic heart failure - Echocardiogram 8/25 with ejection fraction of 70-75%, hyperdynamic function, severe concentric LVH, grade 2 diastolic dysfunction with mildly enlarged right ventricle P: -Continue ASA, statin  -Hold additional diuresis for now, 1.7L UOP yesterday and net negative 3.5L  Acute kidney injury on chronic kidney disease stage IIIa Acute Kidney Injury  Creatinine slowly improving P: -continue to follow renal indices and electrolytes, monitor UOP, Avoid nephrotoxic agents  -Hold off diuresis for now, net negative  Type 2 diabetes P: -Continue SSI insulin  and lantus  20 units per day   -Continue tube feed coverage and Tradjenta      GOC:  Risk of decompensation remains very high Limited pulmonary reserve at baseline Not weaning successfully  patient's daughter updated on 9/1, palliative following, remains full code 9/5 will plan to update family again today   Best Practice (right click and Reselect all SmartList Selections daily)   Diet/type: Tube feeds DVT prophylaxis LMWH GI prophylaxis: PPI Lines: PICC line, A-line Foley:  Yes, and it is still needed Code Status:  full code Last date of multidisciplinary goals of care discussion [9/1-discussed with patient's daughter]  Updated daughter on 9/3- Still remains full code, daughter and family need more time before making decision in regards to tracheostomy  9/5 pending   Critical care time: 69   CRITICAL CARE Performed by: Leita SAUNDERS Arianna Delsanto   Total critical care time: 40 minutes  Critical care time was exclusive of separately billable procedures and treating other patients.  Critical care was necessary to  treat or prevent imminent or life-threatening deterioration.  Critical care was time spent personally by me on the following activities: development of treatment plan with patient and/or surrogate as well as nursing, discussions with consultants, evaluation of patient's response to treatment, examination of patient, obtaining history from patient or surrogate, ordering and performing treatments and interventions, ordering and review of laboratory studies, ordering and review of radiographic studies, pulse oximetry and re-evaluation of patient's condition.   Leita SAUNDERS Raeshaun Simson, PA-C [facility] See Amion for pager If no response to pager , please call [phone] until 7pm After 7:00 pm call Elink  [phone]

## 2023-09-07 NOTE — Plan of Care (Signed)
  Problem: Education: Goal: Knowledge of risk factors and measures for prevention of condition will improve Outcome: Not Progressing   Problem: Education: Goal: Knowledge of General Education information will improve Description: Including pain rating scale, medication(s)/side effects and non-pharmacologic comfort measures Outcome: Not Progressing   Problem: Clinical Measurements: Goal: Will remain free from infection Outcome: Not Progressing   Problem: Clinical Measurements: Goal: Ability to maintain clinical measurements within normal limits will improve Outcome: Not Progressing

## 2023-09-07 NOTE — Progress Notes (Addendum)
 eLink Physician-Brief Progress Note Patient Name: Morgan Fischer DOB: August 29, 1950 MRN: [id number]   Date of Service  09/07/2023  HPI/Events of Note  73 year old female initially presented with acute hypoxic respiratory failure from COVID-pneumonia in the setting of heart failure, diabetes, morbid obesity, complicated by AKI on day 10 of mechanical ventilation and day 13 of high-dose steroids having completed a course of empiric antibiotics.  Fluid overloaded.  Friable airway on bronchoscopic exam.  patient is max on her ordered sedation-> 0.7 of Dex and 400 Fentanyl .  Switched off of propofol  to Precedex  (max 0.7) 2 days prior.   Despite maximal analgesia, sedation, PRNs, the patient has intermittently increased work of breathing where she has incessant coughing, extremis, and is unable to achieve her RASS goal.  At other times, she is calm.  Family aware of the prolonged mechanical ventilation and discussing potential tracheostomy   Elevated driving pressures along with intermittent auto peeping up to 8 cm H2O suggestive of ongoing airways inflammation  eICU Interventions  For now, liberalize Precedex  to standard signature.  Increase frequency of Versed  to every hour's as needed.  Consider initiation of PJP prophylaxis or discontinuation of empiric steroids  Increased frequency of scheduled nebs although she is on long-acting meds as it stands  Increase the PEEP to 8   0421 -hemoglobin 6.3 will send repeat hemoglobin and type and screen.  9460 -hemoglobin 7, hold transfusion for now, continue to trend  Intervention Category Intermediate Interventions: Respiratory distress - evaluation and management  Morgan Fischer 09/07/2023, 4:11 AM

## 2023-09-07 NOTE — Plan of Care (Signed)
  Interdisciplinary Goals of Care Family Meeting   Date carried out:: 09/07/2023  Location of the meeting: Conference room  Member's involved: Family Member or next of kin and Other: physician assistant   Durable Power of Attorney or acting medical decision maker: Pt's daughter Morgan Fischer     Discussion: We discussed goals of care for Morgan Fischer .  I met with [name] daughter Morgan Fischer and we discussed the continued difficulty weaning her off the ventilator and the necessity of pursuing comfort care vs tracheostomy.  Family is not currently interested in comfort care and want all available treatment options and have agreed to move forward with a tracheostomy next week on Monday unless there are major improvement in pt's status over the weekend.     Code status: Full Code  Disposition: Continue current acute care   Time spent for the meeting: 15 minutes  Morgan Fischer 09/07/2023, 3:06 PM

## 2023-09-08 LAB — BASIC METABOLIC PANEL WITH GFR
Anion gap: 14 (ref 5–15)
BUN: 134 mg/dL — ABNORMAL HIGH (ref 8–23)
CO2: 27 mmol/L (ref 22–32)
Calcium: 7.7 mg/dL — ABNORMAL LOW (ref 8.9–10.3)
Chloride: 104 mmol/L (ref 98–111)
Creatinine, Ser: 1.67 mg/dL — ABNORMAL HIGH (ref 0.44–1.00)
GFR, Estimated: 32 mL/min — ABNORMAL LOW (ref >60)
Glucose, Bld: 58 mg/dL — ABNORMAL LOW (ref 70–99)
Potassium: 4.3 mmol/L (ref 3.5–5.1)
Sodium: 146 mmol/L — ABNORMAL HIGH (ref 135–145)

## 2023-09-08 LAB — CBC WITH DIFFERENTIAL/PLATELET
Abs Immature Granulocytes: 1.05 K/uL — ABNORMAL HIGH (ref 0.00–0.07)
Basophils Absolute: 0 K/uL (ref 0.0–0.1)
Basophils Relative: 0 %
Eosinophils Absolute: 0 K/uL (ref 0.0–0.5)
Eosinophils Relative: 0 %
HCT: 24.2 % — ABNORMAL LOW (ref 36.0–46.0)
Hemoglobin: 6.9 g/dL — CL (ref 12.0–15.0)
Immature Granulocytes: 4 %
Lymphocytes Relative: 9 %
Lymphs Abs: 2.3 K/uL (ref 0.7–4.0)
MCH: 26.4 pg (ref 26.0–34.0)
MCHC: 28.5 g/dL — ABNORMAL LOW (ref 30.0–36.0)
MCV: 92.7 fL (ref 80.0–100.0)
Monocytes Absolute: 1.7 K/uL — ABNORMAL HIGH (ref 0.1–1.0)
Monocytes Relative: 7 %
Neutro Abs: 20.3 K/uL — ABNORMAL HIGH (ref 1.7–7.7)
Neutrophils Relative %: 80 %
Platelets: 154 K/uL (ref 150–400)
RBC: 2.61 MIL/uL — ABNORMAL LOW (ref 3.87–5.11)
RDW: 18.9 % — ABNORMAL HIGH (ref 11.5–15.5)
WBC: 25.4 K/uL — ABNORMAL HIGH (ref 4.0–10.5)
nRBC: 3.6 % — ABNORMAL HIGH (ref 0.0–0.2)

## 2023-09-08 LAB — GLUCOSE, CAPILLARY
Glucose-Capillary: 105 mg/dL — ABNORMAL HIGH (ref 70–99)
Glucose-Capillary: 112 mg/dL — ABNORMAL HIGH (ref 70–99)
Glucose-Capillary: 136 mg/dL — ABNORMAL HIGH (ref 70–99)
Glucose-Capillary: 146 mg/dL — ABNORMAL HIGH (ref 70–99)
Glucose-Capillary: 151 mg/dL — ABNORMAL HIGH (ref 70–99)
Glucose-Capillary: 155 mg/dL — ABNORMAL HIGH (ref 70–99)
Glucose-Capillary: 61 mg/dL — ABNORMAL LOW (ref 70–99)
Glucose-Capillary: 66 mg/dL — ABNORMAL LOW (ref 70–99)
Glucose-Capillary: 86 mg/dL (ref 70–99)

## 2023-09-08 LAB — CULTURE, RESPIRATORY W GRAM STAIN

## 2023-09-08 LAB — MAGNESIUM: Magnesium: 3.4 mg/dL — ABNORMAL HIGH (ref 1.7–2.4)

## 2023-09-08 LAB — PREPARE RBC (CROSSMATCH)

## 2023-09-08 LAB — HEMOGLOBIN AND HEMATOCRIT, BLOOD
HCT: 25.5 % — ABNORMAL LOW (ref 36.0–46.0)
Hemoglobin: 7.8 g/dL — ABNORMAL LOW (ref 12.0–15.0)

## 2023-09-08 LAB — MRSA NEXT GEN BY PCR, NASAL: MRSA by PCR Next Gen: NOT DETECTED

## 2023-09-08 LAB — TRIGLYCERIDES: Triglycerides: 312 mg/dL — ABNORMAL HIGH (ref <150)

## 2023-09-08 LAB — PHOSPHORUS: Phosphorus: 6.2 mg/dL — ABNORMAL HIGH (ref 2.5–4.6)

## 2023-09-08 MED ORDER — LINEZOLID 600 MG/300ML IV SOLN
600.0000 mg | Freq: Two times a day (BID) | INTRAVENOUS | Status: DC
  Administered 2023-09-08 – 2023-09-10 (×3): 600 mg via INTRAVENOUS
  Filled 2023-09-08 (×3): qty 300

## 2023-09-08 MED ORDER — SODIUM CHLORIDE 0.9 % IV SOLN
1.0000 g | Freq: Two times a day (BID) | INTRAVENOUS | Status: DC
  Administered 2023-09-08 – 2023-09-09 (×3): 1 g via INTRAVENOUS
  Filled 2023-09-08 (×4): qty 20

## 2023-09-08 MED ORDER — DEXTROSE 50 % IV SOLN: 12.5000 g | Freq: Once | INTRAVENOUS | Status: AC

## 2023-09-08 MED ORDER — DEXTROSE 50 % IV SOLN
INTRAVENOUS | Status: AC
  Administered 2023-09-08: 12.5 g via INTRAVENOUS
  Filled 2023-09-08: qty 50

## 2023-09-08 MED ORDER — LINEZOLID 600 MG/300ML IV SOLN
600.0000 mg | Freq: Two times a day (BID) | INTRAVENOUS | Status: DC
  Filled 2023-09-08: qty 300

## 2023-09-08 MED ORDER — INSULIN GLARGINE 100 UNIT/ML ~~LOC~~ SOLN
10.0000 [IU] | Freq: Every day | SUBCUTANEOUS | Status: DC
  Administered 2023-09-08: 10 [IU] via SUBCUTANEOUS
  Filled 2023-09-08 (×2): qty 0.1

## 2023-09-08 MED ORDER — ARTIFICIAL TEARS OPHTHALMIC OINT
TOPICAL_OINTMENT | OPHTHALMIC | Status: DC | PRN
  Filled 2023-09-08: qty 3.5

## 2023-09-08 MED ORDER — SODIUM CHLORIDE 0.9% IV SOLUTION: Freq: Once | INTRAVENOUS | Status: AC

## 2023-09-08 MED ORDER — VASOPRESSIN 20 UNITS/100 ML INFUSION FOR SHOCK
0.0000 [IU]/min | INTRAVENOUS | Status: DC
  Administered 2023-09-08 – 2023-09-09 (×3): 0.03 [IU]/min via INTRAVENOUS
  Filled 2023-09-08 (×3): qty 100

## 2023-09-08 NOTE — Progress Notes (Signed)
 NAME:  Morgan Fischer, MRN:  [id number], DOB:  1950/11/20, LOS: 13 ADMISSION DATE:  08/26/2023, CONSULTATION DATE: 08/26/2023 REFERRING MD: Redia - TRH, CHIEF COMPLAINT: Acute on chronic hypoxemic/hypercarbic respiratory failure  History of Present Illness:  Patient with known history of chronic respiratory failure on BiPAP at night for hypercapnic respiratory failure, history of chronic diastolic congestive heart failure Transferred from med [facility] where she had presented with worsening shortness of breath of about 5 days duration She has had a cough, no sputum production, no chest pain or chest discomfort No fevers or chills No contact with anyone with a febrile illness No change in bowel habits, no dysuria  Recently followed up at [facility] last Wednesday and prior to that visit was feeling fine but she felt that there may have been people who was sick in the waiting area  Normally on 2 L of oxygen  during the day but in the last few weeks oxygen  levels were increased to 3 L because of desaturations  History of hypertrophic cardiomyopathy, chronic kidney disease stage IIIa, type 2 diabetes asthma/COPD, morbid obesity, usual activity tolerance about 20-30 steps, never smoker  Has been compliant with her usual medications  Tested positive for COVID but does not recollect being around anybody with acute febrile illness  Pertinent Medical History:   Past Medical History:  Diagnosis Date   Acute diastolic heart failure (HCC) 07/17/2019   Acute hypercapnic respiratory failure (HCC) 07/21/2016   Acute on chronic congestive heart failure (HCC) 07/19/2016   Acute on chronic diastolic CHF (congestive heart failure) (HCC) 03/08/2020   Acute on chronic respiratory failure with hypoxia (HCC) 10/20/2018   Acute on chronic respiratory failure with hypoxia and hypercapnia (HCC) 03/08/2020   Acute respiratory acidosis (HCC) 07/19/2016   Acute respiratory distress 07/19/2016   Acute  respiratory failure (HCC) 03/08/2020   Anxiety 03/15/2020   Aortic stenosis 11/01/2018   Arthritis    Asthma    Atherosclerosis of native coronary artery of native heart without angina pectoris 03/17/2016   Cardiomyopathy (HCC) 03/15/2020   Chest pain 07/14/2019   CHF (congestive heart failure) (HCC)    Chronic diastolic heart failure (HCC) 03/17/2016   Chronic hypoxemic respiratory failure (HCC)    Chronic kidney disease, stage 3b (HCC) 02/04/2021   CKD (chronic kidney disease)    COPD (chronic obstructive pulmonary disease) (HCC)    COPD with acute exacerbation (HCC) 03/08/2020   Diabetes mellitus without complication (HCC)    Drug allergy 07/21/2016   Essential (primary) hypertension 11/19/2016   Essential hypertension 11/19/2016   Gout 11/19/2016   Heart block AV complete (HCC)    High cholesterol    History of gout 04/05/2023   HOCM (hypertrophic obstructive cardiomyopathy) (HCC) 09/18/2017   Hyperlipidemia associated with type 2 diabetes mellitus (HCC) 11/23/2022   Hyperlipidemia, unspecified 03/15/2020   Hypertension    Hypertensive heart disease with heart failure (HCC) 03/17/2016   Hypertensive heart disease without congestive heart failure 03/15/2020   Hypertrophic cardiomyopathy (HCC) 04/12/2020   Hypokalemia 08/24/2015   Hypomagnesemia    Hypoxia 03/17/2016   Insomnia 11/23/2022   Iron deficiency 03/15/2020   Leukocytosis 07/19/2016   Long term (current) use of insulin  (HCC) 02/04/2021   Mild intermittent asthma 02/04/2021   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   Mixed hyperlipidemia 03/15/2020   Morbid obesity (HCC)    Morbid obesity with BMI of 60.0-69.9, adult (HCC) 07/21/2016   Need for influenza vaccination 11/23/2022   Need for pneumococcal 20-valent conjugate vaccination 11/23/2022  Nonrheumatic aortic valve stenosis 03/17/2016   Obesity hypoventilation syndrome (HCC) 03/08/2020   Obstructive sleep apnea 11/19/2016   Osteoarthritis of knee 03/15/2020    Panniculitis 08/23/2015   Peripheral venous insufficiency 03/15/2020   Pure hypercholesterolemia 02/13/2017   SOB (shortness of breath) 08/24/2015   Troponin level elevated 08/24/2015   Type 2 diabetes mellitus with hyperglycemia (HCC) 07/19/2016   Type 2 diabetes mellitus with stage 3 chronic kidney disease, with long-term current use of insulin  (HCC) 11/19/2016   Type 2 diabetes mellitus without complication, without long-term current use of insulin  (HCC) 04/12/2020   Type 2 diabetes mellitus without complications (HCC) 03/15/2020   Type 2 diabetes mellitus, without long-term current use of insulin  (HCC) 11/19/2016   Significant Hospital Events: Including procedures, antibiotic start and stop dates in addition to other pertinent events   8/24 CXR with pulmonary vascular congestion 8/25 No acute issues overnight, wore BIPAP. pCO2 remains elevated to 100. Wakes up appropriately but dozes back off. 8/27 intubated  8/28 Overnight, anisocoria noted prompted CT Head . Negative  8/29 some issues with hypertension overnight  8/30-fever 102, cultures drawn-escalated antibiotics 9/1 failed weaning 9/2 only weaned on vent for 30 mins, on low dose fentanyl  gtt  9/3 continues to fail wean, Chest-ray much worse with pulmonary edema, lasix  x 2, tracheal aspirate culture gram stain obtained  9/5 dysynchronous and agitated overnight on precedex , changed back to propofol     Antibiotics: Ceftriaxone  8/24-8/25 Doxycycline  8/24-8/29 Zosyn  8/26-8/30 Meropenem  8/30>>9/3  Linezolid  8/27>>9/2  BAL 8/27-no growth seen  Blood culture 8/30-no growth to date   Tracheal aspirate Acid Fast Smear pending   Tracheal Aspirate Culture obtained on 9/3   BAL 9/3 Rare yeast   Interim History / Subjective:  Continued difficulty with sedation and vent dyssynchrony   Objective:    Blood pressure (!) 112/40, pulse 83, temperature 99.1 F (37.3 C), temperature source Bladder, resp. rate (!) 24, height 5' 3  (1.6 m), weight (!) 140.4 kg, SpO2 93%.    Vent Mode: PRVC FiO2 (%):  [40 %] 40 % Set Rate:  [24 bmp] 24 bmp Vt Set:  [410 mL] 410 mL PEEP:  [8 cmH20] 8 cmH20 Plateau Pressure:  [23 cmH20-28 cmH20] 28 cmH20   Intake/Output Summary (Last 24 hours) at 09/08/2023 1308 Last data filed at 09/08/2023 1236 Gross per 24 hour  Intake 3263.43 ml  Output 1095 ml  Net 2168.43 ml   Filed Weights   09/04/23 0500 09/05/23 0500 09/08/23 0500  Weight: 127.6 kg (!) 137.3 kg (!) 140.4 kg     General:  ill appearing, unresponsive  HEENT: moist mucous membranes ETT in place  Neuro: unresponsive, sedated, not able to assess motor or senstaion  CV: RRR, Nl S1, S2  PULM:  Bilateral crackles and course vent sounds  On PRVC 40% PEEP 8 GI: soft, obese  Extremities: warm, +1 edema more in the upper extremities      CT chest 9/4 IMPRESSION: 1. Limited examination. Redemonstration of diffuse multifocal airspace opacities bilaterally, likely combination of pulmonary edema and/or infection.   2.  Mediastinal lymphadenopathy, likely reactive.   3. Cardiomegaly and dilated main pulmonary artery, suggestive of chronic pulmonary hypertension.   Resolved Problem List:  Shock   Assessment and Plan:   Acute on chronic hypoxemic/hypercapnic respiratory failure COVID-positive COPD with exacerbation - Intubated for hypercapnic respiratory failure on 8/27 - Bilateral infiltrates on chest CT performed on 09/06/23 Unable to wean off and failed multiple SBTs due to tachypnea and belly  breathing as well as poor mental status.  -Diuresis with no clear benefit and rising creatinine  - Completed a course of antibiotics with meropenem  and linezolid   - completed 10 days of steroids  - Family would like to pursue tracheostomy which is planned tentatively early next week - Bronchscopy with no bacterial growth and rare yeast pending speciation     Hypertrophic cardiomyopathy Chronic diastolic heart failure -  Echocardiogram 8/25 with ejection fraction of 70-75%, hyperdynamic function, severe concentric LVH, grade 2 diastolic dysfunction with mildly enlarged right ventricle P: -Continue ASA, statin  -Hold additional diuresis for now  Acute kidney injury on chronic kidney disease stage IIIa Acute Kidney Injury  Bump in Cr with diuresis  P: -continue to follow renal indices and electrolytes, monitor UOP, Avoid nephrotoxic agents  -Hold off diuresis for now, net negative. Given 1 unit of blood today for drop in hb  Type 2 diabetes P: -DecreasLantus to 10 units daily and continue  SSI insulin   -Continue tube feed coverage and Tradjenta      GOC:  Risk of decompensation remains very high Limited pulmonary reserve at baseline Not weaning successfully  patient's daughter updated on 9/6, palliative following, remains full code   Best Practice (right click and Reselect all SmartList Selections daily)   Diet/type: Tube feeds DVT prophylaxis LMWH GI prophylaxis: PPI Lines: PICC line, A-line Foley:  Yes, and it is still needed Code Status:  full code Last date of multidisciplinary goals of care discussion [9/1-discussed with patient's daughter]  Critical care time: 40   CRITICAL CARE Performed by: Zola LOISE Herter   Total critical care time: 32 minutes  Critical care time was exclusive of separately billable procedures and treating other patients.  Critical care was necessary to treat or prevent imminent or life-threatening deterioration.  Critical care was time spent personally by me on the following activities: development of treatment plan with patient and/or surrogate as well as nursing, discussions with consultants, evaluation of patient's response to treatment, examination of patient, obtaining history from patient or surrogate, ordering and performing treatments and interventions, ordering and review of laboratory studies, ordering and review of radiographic studies, pulse oximetry  and re-evaluation of patient's condition.   Zola Herter, MD [facility] Office: [phone]   See Amion for personal pager PCCM on call pager [phone] until 7pm. Please call Elink 7p-7a. [phone]

## 2023-09-08 NOTE — Progress Notes (Signed)
 eLink Physician-Brief Progress Note Patient Name: Morgan Fischer DOB: May 06, 1950 MRN: [id number]   Date of Service  09/08/2023  HPI/Events of Note  Hg 6.9, stable Unchanged NE requirements  eICU Interventions  No intervention     Intervention Category Intermediate Interventions: Bleeding - evaluation and treatment with blood products  Aradia Estey 09/08/2023, 6:02 AM

## 2023-09-08 NOTE — Plan of Care (Signed)
  Problem: Respiratory: Goal: Will maintain a patent airway Outcome: Progressing Goal: Complications related to the disease process, condition or treatment will be avoided or minimized Outcome: Not Progressing   Problem: Clinical Measurements: Goal: Diagnostic test results will improve Outcome: Not Progressing Goal: Cardiovascular complication will be avoided Outcome: Not Progressing   Problem: Nutrition: Goal: Adequate nutrition will be maintained Outcome: Progressing   Problem: Elimination: Goal: Will not experience complications related to urinary retention Outcome: Progressing   Problem: Pain Managment: Goal: General experience of comfort will improve and/or be controlled Outcome: Progressing

## 2023-09-08 NOTE — Plan of Care (Signed)
  Problem: Education: Goal: Knowledge of risk factors and measures for prevention of condition will improve Outcome: Not Progressing   Problem: Coping: Goal: Psychosocial and spiritual needs will be supported Outcome: Not Progressing   Problem: Respiratory: Goal: Will maintain a patent airway Outcome: Not Progressing Goal: Complications related to the disease process, condition or treatment will be avoided or minimized Outcome: Not Progressing   Problem: Clinical Measurements: Goal: Respiratory complications will improve Outcome: Not Progressing Goal: Cardiovascular complication will be avoided Outcome: Not Progressing   Problem: Nutritional: Goal: Maintenance of adequate nutrition will improve Outcome: Not Progressing Goal: Progress toward achieving an optimal weight will improve Outcome: Not Progressing

## 2023-09-09 LAB — CBC WITH DIFFERENTIAL/PLATELET
Abs Immature Granulocytes: 0.98 K/uL — ABNORMAL HIGH (ref 0.00–0.07)
Basophils Absolute: 0.1 K/uL (ref 0.0–0.1)
Basophils Relative: 0 %
Eosinophils Absolute: 0 K/uL (ref 0.0–0.5)
Eosinophils Relative: 0 %
HCT: 26.5 % — ABNORMAL LOW (ref 36.0–46.0)
Hemoglobin: 7.6 g/dL — ABNORMAL LOW (ref 12.0–15.0)
Immature Granulocytes: 4 %
Lymphocytes Relative: 6 %
Lymphs Abs: 1.6 K/uL (ref 0.7–4.0)
MCH: 26.9 pg (ref 26.0–34.0)
MCHC: 28.7 g/dL — ABNORMAL LOW (ref 30.0–36.0)
MCV: 93.6 fL (ref 80.0–100.0)
Monocytes Absolute: 1.5 K/uL — ABNORMAL HIGH (ref 0.1–1.0)
Monocytes Relative: 6 %
Neutro Abs: 22.9 K/uL — ABNORMAL HIGH (ref 1.7–7.7)
Neutrophils Relative %: 84 %
Platelets: 134 K/uL — ABNORMAL LOW (ref 150–400)
RBC: 2.83 MIL/uL — ABNORMAL LOW (ref 3.87–5.11)
RDW: 18.6 % — ABNORMAL HIGH (ref 11.5–15.5)
WBC: 27.1 K/uL — ABNORMAL HIGH (ref 4.0–10.5)
nRBC: 2.4 % — ABNORMAL HIGH (ref 0.0–0.2)

## 2023-09-09 LAB — GLUCOSE, CAPILLARY
Glucose-Capillary: 217 mg/dL — ABNORMAL HIGH (ref 70–99)
Glucose-Capillary: 204 mg/dL — ABNORMAL HIGH (ref 70–99)
Glucose-Capillary: 208 mg/dL — ABNORMAL HIGH (ref 70–99)
Glucose-Capillary: 174 mg/dL — ABNORMAL HIGH (ref 70–99)
Glucose-Capillary: 190 mg/dL — ABNORMAL HIGH (ref 70–99)
Glucose-Capillary: 215 mg/dL — ABNORMAL HIGH (ref 70–99)

## 2023-09-09 LAB — CREATININE, SERUM
Creatinine, Ser: 1.95 mg/dL — ABNORMAL HIGH (ref 0.44–1.00)
GFR, Estimated: 26 mL/min — ABNORMAL LOW (ref >60)

## 2023-09-09 MED ORDER — INSULIN GLARGINE 100 UNIT/ML ~~LOC~~ SOLN
12.0000 [IU] | Freq: Every day | SUBCUTANEOUS | Status: DC
  Filled 2023-09-09: qty 0.12

## 2023-09-09 NOTE — Plan of Care (Signed)
  Problem: Clinical Measurements: Goal: Ability to maintain clinical measurements within normal limits will improve Outcome: Progressing Goal: Cardiovascular complication will be avoided Outcome: Progressing   Problem: Education: Goal: Knowledge of risk factors and measures for prevention of condition will improve Outcome: Not Progressing   Problem: Respiratory: Goal: Will maintain a patent airway Outcome: Not Progressing   Problem: Education: Goal: Knowledge of General Education information will improve Description: Including pain rating scale, medication(s)/side effects and non-pharmacologic comfort measures Outcome: Not Progressing   Problem: Health Behavior/Discharge Planning: Goal: Ability to manage health-related needs will improve Outcome: Not Progressing   Problem: Clinical Measurements: Goal: Will remain free from infection Outcome: Not Progressing Goal: Respiratory complications will improve Outcome: Not Progressing

## 2023-09-09 NOTE — Plan of Care (Signed)
  Problem: Respiratory: Goal: Will maintain a patent airway Outcome: Progressing Goal: Complications related to the disease process, condition or treatment will be avoided or minimized Outcome: Not Progressing   Problem: Clinical Measurements: Goal: Ability to maintain clinical measurements within normal limits will improve Outcome: Not Progressing Goal: Will remain free from infection Outcome: Not Progressing Goal: Diagnostic test results will improve Outcome: Not Progressing Goal: Respiratory complications will improve Outcome: Not Progressing Goal: Cardiovascular complication will be avoided Outcome: Not Progressing   Problem: Nutrition: Goal: Adequate nutrition will be maintained Outcome: Progressing   Problem: Elimination: Goal: Will not experience complications related to bowel motility Outcome: Progressing Goal: Will not experience complications related to urinary retention Outcome: Progressing   Problem: Metabolic: Goal: Ability to maintain appropriate glucose levels will improve Outcome: Not Progressing   Problem: Nutritional: Goal: Maintenance of adequate nutrition will improve Outcome: Progressing

## 2023-09-09 NOTE — Progress Notes (Signed)
 NAME:  Morgan Fischer, MRN:  [id number], DOB:  [birth year], LOS: 14 ADMISSION DATE:  08/26/2023, CONSULTATION DATE: 08/26/2023 REFERRING MD: Redia - TRH, CHIEF COMPLAINT: Acute on chronic hypoxemic/hypercarbic respiratory failure  History of Present Illness:  Patient with known history of chronic respiratory failure on BiPAP at night for hypercapnic respiratory failure, history of chronic diastolic congestive heart failure Transferred from med [facility] where she had presented with worsening shortness of breath of about 5 days duration She has had a cough, no sputum production, no chest pain or chest discomfort No fevers or chills No contact with anyone with a febrile illness No change in bowel habits, no dysuria  Recently followed up at [facility] last Wednesday and prior to that visit was feeling fine but she felt that there may have been people who was sick in the waiting area  Normally on 2 L of oxygen  during the day but in the last few weeks oxygen  levels were increased to 3 L because of desaturations  History of hypertrophic cardiomyopathy, chronic kidney disease stage IIIa, type 2 diabetes asthma/COPD, morbid obesity, usual activity tolerance about 20-30 steps, never smoker  Has been compliant with her usual medications  Tested positive for COVID but does not recollect being around anybody with acute febrile illness  Pertinent Medical History:   Past Medical History:  Diagnosis Date   Acute diastolic heart failure (HCC) 07/17/2019   Acute hypercapnic respiratory failure (HCC) 07/21/2016   Acute on chronic congestive heart failure (HCC) 07/19/2016   Acute on chronic diastolic CHF (congestive heart failure) (HCC) 03/08/2020   Acute on chronic respiratory failure with hypoxia (HCC) 10/20/2018   Acute on chronic respiratory failure with hypoxia and hypercapnia (HCC) 03/08/2020   Acute respiratory acidosis (HCC) 07/19/2016   Acute respiratory distress 07/19/2016   Acute  respiratory failure (HCC) 03/08/2020   Anxiety 03/15/2020   Aortic stenosis 11/01/2018   Arthritis    Asthma    Atherosclerosis of native coronary artery of native heart without angina pectoris 03/17/2016   Cardiomyopathy (HCC) 03/15/2020   Chest pain 07/14/2019   CHF (congestive heart failure) (HCC)    Chronic diastolic heart failure (HCC) 03/17/2016   Chronic hypoxemic respiratory failure (HCC)    Chronic kidney disease, stage 3b (HCC) 02/04/2021   CKD (chronic kidney disease)    COPD (chronic obstructive pulmonary disease) (HCC)    COPD with acute exacerbation (HCC) 03/08/2020   Diabetes mellitus without complication (HCC)    Drug allergy 07/21/2016   Essential (primary) hypertension 11/19/2016   Essential hypertension 11/19/2016   Gout 11/19/2016   Heart block AV complete (HCC)    High cholesterol    History of gout 04/05/2023   HOCM (hypertrophic obstructive cardiomyopathy) (HCC) 09/18/2017   Hyperlipidemia associated with type 2 diabetes mellitus (HCC) 11/23/2022   Hyperlipidemia, unspecified 03/15/2020   Hypertension    Hypertensive heart disease with heart failure (HCC) 03/17/2016   Hypertensive heart disease without congestive heart failure 03/15/2020   Hypertrophic cardiomyopathy (HCC) 04/12/2020   Hypokalemia 08/24/2015   Hypomagnesemia    Hypoxia 03/17/2016   Insomnia 11/23/2022   Iron deficiency 03/15/2020   Leukocytosis 07/19/2016   Long term (current) use of insulin  (HCC) 02/04/2021   Mild intermittent asthma 02/04/2021   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   Mixed hyperlipidemia 03/15/2020   Morbid obesity (HCC)    Morbid obesity with BMI of 60.0-69.9, adult (HCC) 07/21/2016   Need for influenza vaccination 11/23/2022   Need for pneumococcal 20-valent conjugate vaccination 11/23/2022  Nonrheumatic aortic valve stenosis 03/17/2016   Obesity hypoventilation syndrome (HCC) 03/08/2020   Obstructive sleep apnea 11/19/2016   Osteoarthritis of knee 03/15/2020    Panniculitis 08/23/2015   Peripheral venous insufficiency 03/15/2020   Pure hypercholesterolemia 02/13/2017   SOB (shortness of breath) 08/24/2015   Troponin level elevated 08/24/2015   Type 2 diabetes mellitus with hyperglycemia (HCC) 07/19/2016   Type 2 diabetes mellitus with stage 3 chronic kidney disease, with long-term current use of insulin  (HCC) 11/19/2016   Type 2 diabetes mellitus without complication, without long-term current use of insulin  (HCC) 04/12/2020   Type 2 diabetes mellitus without complications (HCC) 03/15/2020   Type 2 diabetes mellitus, without long-term current use of insulin  (HCC) 11/19/2016   Significant Hospital Events: Including procedures, antibiotic start and stop dates in addition to other pertinent events   8/24 CXR with pulmonary vascular congestion 8/25 No acute issues overnight, wore BIPAP. pCO2 remains elevated to 100. Wakes up appropriately but dozes back off. 8/27 intubated  8/28 Overnight, anisocoria noted prompted CT Head . Negative  8/29 some issues with hypertension overnight  8/30-fever 102, cultures drawn-escalated antibiotics 9/1 failed weaning 9/2 only weaned on vent for 30 mins, on low dose fentanyl  gtt  9/3 continues to fail wean, Chest-ray much worse with pulmonary edema, lasix  x 2, tracheal aspirate culture gram stain obtained  9/5 dysynchronous and agitated overnight on precedex , changed back to propofol   09/08/23 more hypotensive, spiked a fever, white count up.   Antibiotics: Ceftriaxone  8/24-8/25 Doxycycline  8/24-8/29 Zosyn  8/26-8/30 Meropenem  8/30>>9/3  Linezolid  8/27>>9/2  Linezolid  9/6 -  Meropenem   9/6 -     BAL 8/27-no growth seen  Blood culture 8/30-no growth to date   Tracheal aspirate Acid Fast Smear pending   Tracheal Aspirate Culture obtained on 9/3   BAL 9/3 Rare yeast  Blood cultures 9/6 no growth so far   Interim History / Subjective:  Unresponsive, unable to communicate complaints.   Objective:     Blood pressure (!) 135/47, pulse 81, temperature 98.2 F (36.8 C), resp. rate (!) 24, height 5' 3 (1.6 m), weight (!) 143.7 kg, SpO2 100%.    Vent Mode: PRVC FiO2 (%):  [40 %-50 %] 50 % Set Rate:  [24 bmp] 24 bmp Vt Set:  [410 mL] 410 mL PEEP:  [8 cmH20] 8 cmH20 Plateau Pressure:  [23 cmH20-28 cmH20] 28 cmH20   Intake/Output Summary (Last 24 hours) at 09/09/2023 1049 Last data filed at 09/09/2023 1024 Gross per 24 hour  Intake 5324.54 ml  Output 975 ml  Net 4349.54 ml   Filed Weights   09/05/23 0500 09/08/23 0500 09/09/23 0500  Weight: (!) 137.3 kg (!) 140.4 kg (!) 143.7 kg     General:  ill appearing, obese, unresponsive  HEENT: moist mucous membranes  Neuro:  Unable to assess mental status, sedated  CV: RRR, systolic murmur  PULM:  Coarse vent breath sounds  GI: Soft, NT , obese  Extremities: warm, +1 edema more in the upper extremities      CT chest 9/4 IMPRESSION: 1. Limited examination. Redemonstration of diffuse multifocal airspace opacities bilaterally, likely combination of pulmonary edema and/or infection.   2.  Mediastinal lymphadenopathy, likely reactive.   3. Cardiomegaly and dilated main pulmonary artery, suggestive of chronic pulmonary hypertension.   Resolved Problem List:  Shock   Assessment and Plan:   Acute on chronic hypoxemic/hypercapnic respiratory failure COVID-positive COPD with exacerbation - Intubated for hypercapnic respiratory failure on 8/27 - Bilateral infiltrates on chest CT  performed on 09/06/23 Unable to wean off and failed multiple SBTs due to tachypnea and belly breathing as well as poor mental status.  -Diuresis with no clear benefit and rising creatinine  - Completed a course of antibiotics with meropenem  and linezolid . Spiked a fever last night and was on more pressors with a rising white count. Antibiotics restarted  - completed 10 days of steroids  - Family would like to pursue tracheostomy which is planned tentatively  early next week - Bronchscopy with no bacterial growth and rare yeast pending speciation     Hypertrophic cardiomyopathy Chronic diastolic heart failure - Echocardiogram 8/25 with ejection fraction of 70-75%, hyperdynamic function, severe concentric LVH, grade 2 diastolic dysfunction with mildly enlarged right ventricle P: -Continue ASA, statin  -Hold additional diuresis for now  Acute kidney injury on chronic kidney disease stage IIIa Acute Kidney Injury  Bump in Cr with diuresis  P: -continue to follow renal indices and electrolytes, monitor UOP, Avoid nephrotoxic agents    Type 2 diabetes P: -increase 12 units daily and continue  SSI insulin   - Tube feed insulin  coverage held    GOC:  Risk of decompensation remains very high Limited pulmonary reserve at baseline Not weaning successfully  patient's daughter updated on 9/6, palliative following, remains full code   Best Practice (right click and Reselect all SmartList Selections daily)   Diet/type: Tube feeds DVT prophylaxis LMWH GI prophylaxis: PPI Lines: PICC line, A-line Foley:  Yes, and it is still needed Code Status:  full code Last date of multidisciplinary goals of care discussion [9/1-discussed with patient's daughter]   CRITICAL CARE Performed by: Zola LOISE Herter  Total critical care time: 35 minutes  Critical care time was exclusive of separately billable procedures and treating other patients.  Critical care was necessary to treat or prevent imminent or life-threatening deterioration.  Critical care was time spent personally by me on the following activities: development of treatment plan with patient and/or surrogate as well as nursing, discussions with consultants, evaluation of patient's response to treatment, examination of patient, obtaining history from patient or surrogate, ordering and performing treatments and interventions, ordering and review of laboratory studies, ordering and review of  radiographic studies, pulse oximetry and re-evaluation of patient's condition.   Zola Herter, MD [facility] Office: [phone]   See Amion for personal pager PCCM on call pager [phone] until 7pm. Please call Elink 7p-7a. [phone]

## 2023-09-10 LAB — TYPE AND SCREEN
ABO/RH(D): O POS
Antibody Screen: NEGATIVE
Unit division: 0

## 2023-09-10 LAB — CULTURE, RESPIRATORY W GRAM STAIN

## 2023-09-10 LAB — GLUCOSE, CAPILLARY: Glucose-Capillary: 223 mg/dL — ABNORMAL HIGH (ref 70–99)

## 2023-09-10 LAB — BPAM RBC
Blood Product Expiration Date: 202510022359
ISSUE DATE / TIME: 202509061205
Unit Type and Rh: 5100

## 2023-09-10 MED ORDER — EPINEPHRINE 1 MG/10ML IJ SOSY
PREFILLED_SYRINGE | INTRAMUSCULAR | Status: AC
  Filled 2023-09-10: qty 10

## 2023-09-10 MED ORDER — SODIUM BICARBONATE 8.4 % IV SOLN
INTRAVENOUS | Status: AC
  Filled 2023-09-10: qty 50

## 2023-09-10 MED ORDER — EPINEPHRINE HCL 5 MG/250ML IV SOLN IN NS
INTRAVENOUS | Status: AC
  Filled 2023-09-10: qty 250

## 2023-09-10 MED FILL — Medication: Qty: 1 | Status: AC

## 2023-09-13 LAB — CULTURE, BLOOD (ROUTINE X 2)
Culture: NO GROWTH
Culture: NO GROWTH

## 2023-09-14 DIAGNOSIS — G4733 Obstructive sleep apnea (adult) (pediatric): Secondary | ICD-10-CM | POA: Diagnosis not present

## 2023-09-14 DIAGNOSIS — J45909 Unspecified asthma, uncomplicated: Secondary | ICD-10-CM | POA: Diagnosis not present

## 2023-09-14 DIAGNOSIS — M356 Relapsing panniculitis [Weber-Christian]: Secondary | ICD-10-CM | POA: Diagnosis not present

## 2023-09-14 DIAGNOSIS — R269 Unspecified abnormalities of gait and mobility: Secondary | ICD-10-CM | POA: Diagnosis not present

## 2023-10-01 LAB — FUNGUS CULTURE RESULT

## 2023-10-01 LAB — FUNGAL ORGANISM REFLEX

## 2023-10-01 LAB — FUNGUS CULTURE WITH STAIN

## 2023-10-03 NOTE — Death Summary Note (Signed)
 DEATH SUMMARY   Patient Details  Name: Morgan Fischer MRN: [id number] DOB: [birth year] ERE:Ontwz [name], Morgan SAUNDERS, DO  Admission/Discharge Information   Admit Date:  09/22/2023  Date of Death: Date of Death: 10-07-2023  Time of Death: Time of Death: 0227  Length of Stay: 2023-10-14   Principle Cause of death: Cardio-pulmonary arrest   Hospital Diagnoses: Principal Problem:   Respiratory failure (HCC) Active Problems:   Morbid obesity (HCC)   Hypertension   Type 2 diabetes mellitus with stage 3 chronic kidney disease, with long-term current use of insulin  (HCC)   Chronic diastolic heart failure (HCC)   Acute on chronic respiratory failure with hypoxia (HCC)   HOCM (hypertrophic obstructive cardiomyopathy) (HCC)   Acute on chronic respiratory failure with hypoxia and hypercapnia (HCC)   Acute on chronic diastolic CHF (congestive heart failure) (HCC)   Respiratory distress   Chronic pulmonary edema   COPD exacerbation (HCC)   Palliative care encounter   Counseling and coordination of care   COVID-19   Need for emotional support   Goals of care, counseling/discussion   AKI (acute kidney injury) (HCC)   Pressure injury of skin   Hospital Course: 73 F with a past medical history of chronic hypoxic hypercapnic respiratory failure on 2L baseline and on nocturnal BIPAP and chronic diastolic congestive heart failure who was transferred from med [facility] [place] with shortness of breath associated with cough. Work up significant for COVID positive PCR.  Post admission she had worsening hypoxia and was intubated on 08/29/23. Despite improvements in vent settings she failed multiple SBTs and had recurrent fevers. Repeat CT chest with bilateral infiltrates concerning for multifocal pneumonia with possible super imposed pulmonary edema. She had ongoing pressor requirements and episodes of hypoxia. On 10/07/2023 overnight, she had a reported large emesis episode with subsequent cardiac arrest. Initial  rhythm was noted to be PEA but later evolved into asystole. Resuscitation effort lasted 30 minutes. She was pronounced deceased at 73. Family was updated.          The results of significant diagnostics from this hospitalization (including imaging, microbiology, ancillary and laboratory) are listed below for reference.   Significant Diagnostic Studies: CT CHEST WO CONTRAST Result Date: 09/06/2023 CLINICAL DATA:  Pneumonia, complications suspected, x-ray done EXAM: CT CHEST WITHOUT CONTRAST TECHNIQUE: Multidetector CT imaging of the chest was performed following the standard protocol without IV contrast. RADIATION DOSE REDUCTION: This exam was performed according to the departmental dose-optimization program which includes automated exposure control, adjustment of the mA and/or kV according to patient size and/or use of iterative reconstruction technique. COMPARISON:  Same day chest radiograph and prior studies FINDINGS: Limited examination due to respiratory motion artifact. Cardiovascular: Normal caliber thoracic aorta. No pericardial effusion. Right-sided PICC line with tip at the cavoatrial junction. Coronary artery and mitral annular calcifications. Dilated main pulmonary artery, suggestive of pulmonary hypertension. Mediastinum/Nodes: Unchanged prominent mediastinal lymph nodes, with right paratracheal lymph node measuring up to 1.4 cm, likely reactive. Lungs/Pleura: Endotracheal tube with tip in the lower trachea. Diffuse multifocal airspace opacities bilaterally, similar to prior chest radiograph but increased since prior CT performed August 27, 2023. Small right and trace left pleural effusions. Upper Abdomen: No acute findings.  Partially imaged enteric tube. Musculoskeletal: No acute osseous findings. IMPRESSION: 1. Limited examination. Redemonstration of diffuse multifocal airspace opacities bilaterally, likely combination of pulmonary edema and/or infection. 2.  Mediastinal lymphadenopathy,  likely reactive. 3. Cardiomegaly and dilated main pulmonary artery, suggestive of chronic pulmonary hypertension. Aortic Atherosclerosis (  ICD10-I70.0). Electronically Signed   By: Michaeline Blanch M.D.   On: 09/06/2023 12:30   DG CHEST PORT 1 VIEW Result Date: 09/06/2023 CLINICAL DATA:  5626.  Respiratory failure, ventilator dependent. EXAM: PORTABLE CHEST 1 VIEW COMPARISON:  Portable chest yesterday at 10:01 a.m. FINDINGS: 5:04 a.m. ETT tip is 3.7 cm from the carina. NGT in stomach with the full intragastric course not seen. Esophageal temperature probe is in the distal esophagus somewhat below the carinal level. Right PICC tip is at the superior cavoatrial junction. There is stable moderate cardiomegaly. Small pleural effusions appear similar. There is prominence of the central vasculature. Diffuse heterogeneous interstitial and airspace opacities of the lungs continue to be seen consistent with pneumonia, edema or combination. The mediastinum is stable. There is aortic atherosclerosis. No new osseous finding. IMPRESSION: 1. No significant change in the appearance of the chest. 2. Stable cardiomegaly and small pleural effusions. 3. Stable widespread heterogeneous interstitial and airspace opacities of the lungs consistent with pneumonia, edema or combination. 4. Support apparatus as above. Electronically Signed   By: Francis Quam M.D.   On: 09/06/2023 07:27   DG CHEST PORT 1 VIEW Result Date: 09/05/2023 CLINICAL DATA:  Hypoxia. EXAM: PORTABLE CHEST 1 VIEW COMPARISON:  Radiograph 09/02/2023 FINDINGS: Significant patient rotation. The endotracheal tube is not clearly seen on the current exam. Enteric tube tip below the diaphragm. There is an esophageal temperature probe. Right upper extremity PICC tip in the SVC. Cardiomegaly is grossly stable. Increase in diffuse interstitial lung opacities. Grossly stable pleural effusions. No pneumothorax. IMPRESSION: 1. Significant patient rotation. Endotracheal tube is not  clearly seen on the current exam. 2. Increase in diffuse interstitial lung opacities, favor pulmonary edema. Grossly stable pleural effusions. Electronically Signed   By: Andrea Gasman M.D.   On: 09/05/2023 11:02   DG CHEST PORT 1 VIEW Result Date: 09/02/2023 EXAM: 1 VIEW XRAY OF THE CHEST 09/02/2023 12:26:00 PM COMPARISON: 08/31/2023 CLINICAL HISTORY: Patient with known history of chronic respiratory failure on BiPAP at night for hypercapnic respiratory failure, history of chronic diastolic congestive heart failure. Transferred from med [facility] where she had presented with worsening shortness of breath of about 5 days duration. FINDINGS: LUNGS AND PLEURA: Bilateral interstitial and airspace opacities, left greater than right, increased compared to prior exam. Similar small bilateral pleural effusions. HEART AND MEDIASTINUM: Unchanged cardiomegaly. BONES AND SOFT TISSUES: No acute osseous abnormality. LINES AND TUBES: Endotracheal tube in place with tip 4.7 cm above the carina. Gastric tube in place coursing below the diaphragm. Right upper extremity PICC in place terminating over the lower SVC. IMPRESSION: 1. Well positioned lines and tubes. 2. Worsened moderate cardiogenic pulmonary edema. 3. Similar small bilateral pleural effusions. Electronically signed by: Selinda Blue MD 09/02/2023 02:20 PM EDT RP Workstation: [id number]   DG CHEST PORT 1 VIEW Result Date: 08/31/2023 EXAM: 1 VIEW XRAY OF THE CHEST 08/31/2023 04:53:59 AM COMPARISON: 08/29/2023 CLINICAL HISTORY: 33498 Respiratory failure (HCC) 33498. Resp failure FINDINGS: LUNGS AND PLEURA: Lung volumes are low. Diffuse increase in interstitial markings are noted bilaterally concerning for edema versus atypical infection. This is similar to the previous exam. No pleural effusion. No pneumothorax. HEART AND MEDIASTINUM: Unchanged cardiac enlargement. BONES AND SOFT TISSUES: No acute osseous abnormality. LINES AND TUBES: ET tube tip is 2.4 cm  above the carina. Enteric tube courses below the level of the GE junction. Right arm PICC line is identified with tip over the superior cavoatrial junction. IMPRESSION: 1. Diffuse increase in interstitial markings  bilaterally, concerning for edema versus atypical infection, similar to the previous exam. 2. Unchanged cardiac enlargement. Electronically signed by: Waddell Calk MD 08/31/2023 08:52 AM EDT RP Workstation: [id number]   CT HEAD WO CONTRAST ( ) Result Date: 08/29/2023 CLINICAL DATA:  Initial evaluation for acute neuro deficit, stroke suspected. EXAM: CT HEAD WITHOUT CONTRAST TECHNIQUE: Contiguous axial images were obtained from the base of the skull through the vertex without intravenous contrast. RADIATION DOSE REDUCTION: This exam was performed according to the departmental dose-optimization program which includes automated exposure control, adjustment of the mA and/or kV according to patient size and/or use of iterative reconstruction technique. COMPARISON:  None Available. FINDINGS: Brain: Mild age-related cerebral atrophy with chronic small vessel ischemic disease. No acute intracranial hemorrhage. No acute large vessel territory infarct. No mass lesion or midline shift. No hydrocephalus or extra-axial fluid collection. Vascular: No abnormal hyperdense vessel. Calcified atherosclerosis present at skull base. Skull: Scalp soft tissues demonstrate no acute finding. Calvarium intact. Sinuses/Orbits: Globes orbital soft tissues within normal limits. Mild mucosal thickening present about the ethmoidal air cells paranasal sinuses are otherwise largely clear. No significant mastoid effusion. Other: None. IMPRESSION: 1. No acute intracranial abnormality. 2. Mild age-related cerebral atrophy with chronic small vessel ischemic disease. Electronically Signed   By: Morene Hoard M.D.   On: 08/29/2023 23:22   Portable Chest x-ray Result Date: 08/29/2023 EXAM: 1 VIEW XRAY OF THE CHEST 08/29/2023  10:30:00 AM COMPARISON: 08/28/2023 CLINICAL HISTORY: Endotracheal tube present. FINDINGS: LUNGS AND PLEURA: Worsening airspace opacities throughout the right lung, and left perihilar and basilar airspace consolidation with possible effusion. HEART AND MEDIASTINUM: No acute abnormality of the cardiac and mediastinal silhouettes. Aortic atherosclerosis (ICD10-I70.0). BONES AND SOFT TISSUES: No acute osseous abnormality. LINES AND TUBES: Endotracheal tube in appropriate position, tip 4.5 cm above carina. Nasogastric tube in appropriate position, tip in the stomach. IMPRESSION: 1. Worsening airspace opacities throughout the right lung, and left perihilar and basilar airspace consolidation with possible effusion. 2. Endotracheal tube in appropriate position, tip 4.5 cm above carina. Electronically signed by: Katheleen Faes MD 08/29/2023 11:24 AM EDT RP Workstation: [id number]   DG Abd Portable 1V Result Date: 08/29/2023 EXAM: 1 VIEW XRAY OF THE ABDOMEN 08/29/2023 10:30:00 AM COMPARISON: CT 08/23/2015 CLINICAL HISTORY: Encounter for imaging study to confirm orogastric (OG) tube placement. FINDINGS: BOWEL: Nonobstructive bowel gas pattern. SOFT TISSUES: No opaque urinary calculi. Gastric tube extends to the stomach. BONES: No acute osseous abnormality. IMPRESSION: 1. Gastric tube extends to the stomach. Electronically signed by: Katheleen Faes MD 08/29/2023 11:21 AM EDT RP Workstation: [id number]   DG CHEST PORT 1 VIEW Result Date: 08/28/2023 CLINICAL DATA:  Follow-up pneumonia. EXAM: PORTABLE CHEST 1 VIEW COMPARISON:  08/26/2023.  Chest CT dated 08/27/2023. FINDINGS: Stable enlarged cardiac silhouette. Mildly progressive bilateral patchy densities, especially throughout the right lung. No visible pleural fluid. Tortuous and partially calcified thoracic aorta. Bilateral shoulder degenerative changes. IMPRESSION: 1. Mildly progressive bilateral pneumonia, especially throughout the right lung. 2. Stable cardiomegaly.  Electronically Signed   By: Elspeth Bathe M.D.   On: 08/28/2023 18:02   US  EKG SITE RITE Result Date: 08/28/2023 If [facility] image not attached, placement could not be confirmed due to current cardiac rhythm.  ECHOCARDIOGRAM COMPLETE Result Date: 08/27/2023    ECHOCARDIOGRAM REPORT   Patient Name:   Morgan Fischer Date of Exam: 08/27/2023 Medical Rec #:  [id number]    Height:       63.0 in Accession #:    [id number]   Weight:  278.4 lb Date of Birth:  1950/05/06    BSA:          2.226 m Patient Age:    72 years     BP:           155/122 mmHg Patient Gender: F            HR:           63 bpm. Exam Location:  Inpatient Procedure: Cardiac Doppler and Color Doppler (Both Spectral and Color Flow            Doppler were utilized during procedure). Indications:     HOCM  History:         Patient has prior history of Echocardiogram examinations, most                  recent 03/19/2020. Cardiomyopathy and Hypertrophic                  Cardiomyopathy, Aortic Valve Disease, Signs/Symptoms:Murmur;                  Risk Factors:Hypertension and Diabetes.  Sonographer:     Therisa Crouch Referring Phys:  [id number] SALENA NEGRI Diagnosing Phys: SALENA NEGRI MD IMPRESSIONS  1. No significant difference in LV function and structure from prior study. Left ventricular ejection fraction, by estimation, is 70 to 75%. The left ventricle has hyperdynamic function. The left ventricle has no regional wall motion abnormalities. There is severe concentric left ventricular hypertrophy. Left ventricular diastolic parameters are consistent with Grade II diastolic dysfunction (pseudonormalization).  2. Right ventricular systolic function is normal. The right ventricular size is mildly enlarged.  3. Left atrial size was severely dilated.  4. Right atrial size was mild to moderately dilated.  5. Systolic anterior motion of MV leaflet. The mitral valve is degenerative. Mild mitral valve regurgitation. Moderate mitral annular calcification.  6. The  aortic valve is tricuspid. There is mild calcification of the aortic valve. There is mild thickening of the aortic valve. Aortic valve regurgitation is not visualized. Aortic valve sclerosis/calcification is present, without any evidence of aortic stenosis.  7. There is mild (Grade II) atheroma plaque involving the aortic root.  8. The inferior vena cava is dilated in size with <50% respiratory variability, suggesting right atrial pressure of 15 mmHg. Conclusion(s)/Recommendation(s): Findings consistent with hypertrophic obstructive cardiomyopathy. FINDINGS  Left Ventricle: No significant difference in LV function and structure from prior study. Left ventricular ejection fraction, by estimation, is 70 to 75%. The left ventricle has hyperdynamic function. The left ventricle has no regional wall motion abnormalities. Definity  contrast agent was given IV to delineate the left ventricular endocardial borders. The left ventricular internal cavity size was small. There is severe concentric left ventricular hypertrophy. Left ventricular diastolic parameters  are consistent with Grade II diastolic dysfunction (pseudonormalization). Right Ventricle: The right ventricular size is mildly enlarged. No increase in right ventricular wall thickness. Right ventricular systolic function is normal. Left Atrium: Left atrial size was severely dilated. Right Atrium: Right atrial size was mild to moderately dilated. Pericardium: There is no evidence of pericardial effusion. Mitral Valve: Systolic anterior motion of MV leaflet. The mitral valve is degenerative in appearance. There is mild thickening of the mitral valve leaflet(s). There is mild calcification of the mitral valve leaflet(s). Normal mobility of the mitral valve  leaflets. Moderate mitral annular calcification. Mild mitral valve regurgitation. Tricuspid Valve: The tricuspid valve is grossly normal. Tricuspid valve regurgitation is mild. Aortic Valve: The  aortic valve is  tricuspid. There is mild calcification of the aortic valve. There is mild thickening of the aortic valve. There is mild aortic valve annular calcification. Aortic valve regurgitation is not visualized. Aortic valve sclerosis/calcification is present, without any evidence of aortic stenosis. Aortic valve mean gradient measures 13.0 mmHg. Aortic valve peak gradient measures 25.0 mmHg. Pulmonic Valve: The pulmonic valve was normal in structure. Pulmonic valve regurgitation is mild. Aorta: The aortic root is normal in size and structure. There is mild (Grade II) atheroma plaque involving the aortic root. Venous: The inferior vena cava is dilated in size with less than 50% respiratory variability, suggesting right atrial pressure of 15 mmHg. IAS/Shunts: The atrial septum is grossly normal.  LEFT VENTRICLE PLAX 2D LVIDd:         5.00 cm   Diastology LVIDs:         2.80 cm   LV e' medial:    2.33 cm/s LV PW:         1.30 cm   LV E/e' medial:  43.8 LV IVS:        2.10 cm   LV e' lateral:   4.05 cm/s LVOT diam:     1.80 cm   LV E/e' lateral: 25.2 LVOT Area:     2.54 cm  RIGHT VENTRICLE            IVC RV S prime:     8.63 cm/s  IVC diam: 2.30 cm TAPSE (M-mode): 2.1 cm LEFT ATRIUM              Index LA diam:        4.80 cm  2.16 cm/m LA Vol (A2C):   128.0 ml 57.51 ml/m LA Vol (A4C):   116.0 ml 52.12 ml/m LA Biplane Vol: 129.0 ml 57.96 ml/m  AORTIC VALVE AV Vmax:      250.00 cm/s AV Vmean:     170.000 cm/s AV VTI:       0.559 m AV Peak Grad: 25.0 mmHg AV Mean Grad: 13.0 mmHg  AORTA Ao Root diam: 2.90 cm Ao Asc diam:  3.80 cm MITRAL VALVE                TRICUSPID VALVE MV Area (PHT): 2.74 cm     TV Peak grad:   63.0 mmHg MV Decel Time: 277 msec     TV Vmax:        3.97 m/s MV E velocity: 102.00 cm/s  TR Peak grad:   63.0 mmHg MV A velocity: 75.40 cm/s   TR Vmax:        397.00 cm/s MV E/A ratio:  1.35                             SHUNTS                             Systemic Diam: 1.80 cm Salena Negri MD Electronically signed  by Salena Negri MD Signature Date/Time: 08/27/2023/5:34:50 PM    Final    CT CHEST WO CONTRAST Result Date: 08/27/2023 CLINICAL DATA:  Shortness of breath. EXAM: CT CHEST WITHOUT CONTRAST TECHNIQUE: Multidetector CT imaging of the chest was performed following the standard protocol without IV contrast. RADIATION DOSE REDUCTION: This exam was performed according to the departmental dose-optimization program which includes automated exposure control, adjustment of the mA and/or kV according to patient size  and/or use of iterative reconstruction technique. COMPARISON:  CT chest 04/08/2023 FINDINGS: Cardiovascular: Atherosclerotic calcifications in the thoracic aorta. Stable cardiac enlargement. Small amount of pericardial fluid. Mediastinum/Nodes: Right paratracheal lymph node on image 21/2 measures 1.5 cm in the short axis and previously measured 1.2 cm. Precarinal lymph node measures 1.5 cm and previously measured 1.2 cm. Overall, mediastinal lymph nodes have slightly enlarged in size. Limited evaluation for hilar lymph nodes without intravascular contrast. No significant axillary lymph node enlargement. Lungs/Pleura: Trachea and mainstem bronchi are patent. Patchy peripheral opacities in the right lower lobe nonspecific and could be associated with areas of atelectasis versus infection. Subtle patchy densities in the right upper lobe. No large pleural effusions. Overall, low lung volumes. Upper Abdomen: Limited evaluation of the upper abdomen without gross acute abnormality. Musculoskeletal: No acute bone abnormality. IMPRESSION: 1. Patchy opacities in the right lung, particularly the right lower lobe. Findings are nonspecific and could be associated with areas of atelectasis versus infection. 2. Mediastinal lymph nodes have slightly enlarged in size. These lymph nodes are nonspecific and could be reactive. 3. Aortic Atherosclerosis (ICD10-I70.0). 4. Cardiomegaly. Electronically Signed   By: Juliene Balder M.D.   On:  08/27/2023 13:44   DG Chest Port 1 View Result Date: 08/26/2023 CLINICAL DATA:  Shortness of breath. EXAM: PORTABLE CHEST 1 VIEW COMPARISON:  08/16/2023. FINDINGS: Patient is rotated to the left. Unchanged marked enlargement of the cardiopericardial silhouette with pulmonary vascular congestion and possible interstitial edema. No large pleural effusion. No pneumothorax. No acute osseous abnormality. IMPRESSION: Marked cardiomegaly with pulmonary vascular congestion and suspected interstitial edema, similar to the prior exam. Electronically Signed   By: Harrietta Sherry M.D.   On: 08/26/2023 12:57   DG Chest 2 View Result Date: 08/16/2023 CLINICAL DATA:  Shortness of breath. EXAM: CHEST - 2 VIEW COMPARISON:  04/14/2023. FINDINGS: Marked cardiomegaly, unchanged. Aortic atherosclerosis. Central pulmonary vascular congestion with diffuse bilateral interstitial prominence, likely reflecting pulmonary interstitial edema. Suspected trace bilateral pleural effusions. No pneumothorax. No acute osseous abnormality. IMPRESSION: 1. Cardiomegaly with central pulmonary vascular congestion and findings suggestive of pulmonary interstitial edema. 2. Suspected trace bilateral pleural effusions. Electronically Signed   By: Harrietta Sherry M.D.   On: 08/16/2023 12:09    Microbiology: Recent Results (from the past 240 hours)  Culture, Respiratory w Gram Stain     Status: None   Collection Time: 09/01/23  5:44 PM   Specimen: Tracheal Aspirate; Respiratory  Result Value Ref Range Status   Specimen Description   Final    TRACHEAL ASPIRATE Performed at [facility], [address], [place], [place] [address]    Special Requests   Final    RESPIRATORY Performed at [facility], [address], [place], [place] [address]    Gram Stain   Final    RARE WBC PRESENT, PREDOMINANTLY PMN NO ORGANISMS SEEN Performed at [facility], [address], [place], [place] [address]     Culture FEW CANDIDA GLABRATA RARE CANDIDA ALBICANS   Final   Report Status 2023-09-18 FINAL  Final  Culture, blood (Routine X 2) w Reflex to ID Panel     Status: None   Collection Time: 09/01/23  8:31 PM   Specimen: BLOOD LEFT HAND  Result Value Ref Range Status   Specimen Description BLOOD LEFT HAND  Final   Special Requests   Final    BOTTLES DRAWN AEROBIC ONLY Blood Culture results may not be optimal due to an inadequate volume  of blood received in culture bottles   Culture   Final    NO GROWTH 5 DAYS Performed at [facility], [address] [place], [place] [address]    Report Status 09/06/2023 FINAL  Final  Culture, blood (Routine X 2) w Reflex to ID Panel     Status: None   Collection Time: 09/01/23  8:31 PM   Specimen: BLOOD LEFT HAND  Result Value Ref Range Status   Specimen Description BLOOD LEFT HAND  Final   Special Requests   Final    BOTTLES DRAWN AEROBIC ONLY Blood Culture results may not be optimal due to an inadequate volume of blood received in culture bottles   Culture   Final    NO GROWTH 5 DAYS Performed at [facility], [address] [place], [place] [address]    Report Status 09/06/2023 FINAL  Final  Urine Culture     Status: None   Collection Time: 09/02/23  6:22 AM   Specimen: Urine, Random  Result Value Ref Range Status   Specimen Description   Final    URINE, RANDOM Performed at [facility], [address] [place], [place] [address]    Special Requests   Final    NONE Reflexed from [id number] Performed at [facility], [address] [place], [place] [address]    Culture   Final    NO GROWTH Performed at [facility], [address] [place], [place] [address]    Report Status 09/04/2023 FINAL  Final  Culture, Respiratory w Gram Stain     Status: None   Collection Time: 09/05/23  6:00 PM   Specimen: Tracheal Aspirate; Respiratory  Result Value Ref Range Status   Specimen Description    Final    TRACHEAL ASPIRATE Performed at [facility], [address], [place], [place] [address]    Special Requests   Final    TRACHEAL ASPIRATE Performed at [facility], [address] [place], [place] [address]    Gram Stain   Final    RARE WBC PRESENT, PREDOMINANTLY PMN NO ORGANISMS SEEN Performed at [facility], [address] [place], [place] [address]    Culture RARE CANDIDA ALBICANS RARE CANDIDA GLABRATA   Final   Report Status 09/08/2023 FINAL  Final  MRSA Next Gen by PCR, Nasal     Status: None   Collection Time: 09/08/23  7:02 PM   Specimen: Nasal Mucosa; Nasal Swab  Result Value Ref Range Status   MRSA by PCR Next Gen NOT DETECTED NOT DETECTED Final    Comment: (NOTE) The GeneXpert MRSA Assay (FDA approved for NASAL specimens only), is one component of a comprehensive MRSA colonization surveillance program. It is not intended to diagnose MRSA infection nor to guide or monitor treatment for MRSA infections. Test performance is not FDA approved in patients less than 41 years old. Performed at [facility], [address] [place], [place] [address]   Culture, blood (Routine X 2) w Reflex to ID Panel     Status: None (Preliminary result)   Collection Time: 09/08/23  7:31 PM   Specimen: BLOOD LEFT ARM  Result Value Ref Range Status   Specimen Description   Final    BLOOD LEFT ARM Performed at [facility], [address] [place], [place] [address]    Special Requests   Final    BOTTLES DRAWN AEROBIC  AND ANAEROBIC Blood Culture results may not be optimal due to an inadequate volume of blood received in culture bottles Performed at [facility], [address] [place], [place] [address]    Culture   Final    NO GROWTH 3 DAYS Performed at [facility], [address], [place], [place] [address]    Report Status PENDING  Incomplete  Culture, blood (Routine X 2) w  Reflex to ID Panel     Status: None (Preliminary result)   Collection Time: 09/08/23  7:31 PM   Specimen: BLOOD LEFT HAND  Result Value Ref Range Status   Specimen Description   Final    BLOOD LEFT HAND Performed at [facility], [address], [place], [place] [address]    Special Requests   Final    BOTTLES DRAWN AEROBIC AND ANAEROBIC Blood Culture results may not be optimal due to an inadequate volume of blood received in culture bottles Performed at [facility], [address] [place], [place] [address]    Culture   Final    NO GROWTH 3 DAYS Performed at [facility], [address], [place], [place] [address]    Report Status PENDING  Incomplete    Time spent: 21 minutes  Signed: Zola LOISE Herter, MD Sep 17, 2023

## 2023-10-03 NOTE — Progress Notes (Signed)
 RT attempted to get ABG and was unsuccessful.

## 2023-10-03 NOTE — Progress Notes (Deleted)
 Patient coded.SABRASABRAETT tube was taken out by RN.

## 2023-10-03 NOTE — Progress Notes (Signed)
 eLink Physician-Brief Progress Note Patient Name: Morgan Fischer DOB: [birth year] MRN: [id number]   Date of Service  09/25/2023  HPI/Events of Note  [name] had a cardiac arrest this evening with a large episode of emesis noted prior.  Her initial rhythm was PEA which evolved to asystole.  Her resuscitation effort lasted approximately 30 minutes with only brief return of spontaneous circulation early in the effort.  She was pronounced deceased at 96.  Daughter was notified.  All questions answered.  eICU Interventions  As above     Intervention Category Major Interventions: Other:  CLAUDENE AGENT, P Sep 25, 2023, 2:41 AM

## 2023-10-03 NOTE — Progress Notes (Signed)
 RT was informed that RN had taken ETT tube out post code.

## 2023-10-03 DEATH — deceased

## 2023-10-11 LAB — ACID FAST CULTURE WITH REFLEXED SENSITIVITIES (MYCOBACTERIA): Acid Fast Culture: NEGATIVE

## 2024-02-19 ENCOUNTER — Ambulatory Visit: Payer: Medicare HMO
